# Patient Record
Sex: Male | Born: 1950 | Race: White | Hispanic: No | Smoking: Current every day smoker
Health system: Southern US, Community
[De-identification: ages and names within clinical notes are randomized; demographics above are authoritative.]

## PROBLEM LIST (undated history)

## (undated) DIAGNOSIS — Z720 Tobacco use: Secondary | ICD-10-CM

## (undated) DIAGNOSIS — E119 Type 2 diabetes mellitus without complications: Secondary | ICD-10-CM

## (undated) DIAGNOSIS — J189 Pneumonia, unspecified organism: Secondary | ICD-10-CM

## (undated) DIAGNOSIS — B879 Myiasis, unspecified: Secondary | ICD-10-CM

## (undated) DIAGNOSIS — E11621 Type 2 diabetes mellitus with foot ulcer: Secondary | ICD-10-CM

## (undated) DIAGNOSIS — L03115 Cellulitis of right lower limb: Secondary | ICD-10-CM

## (undated) DIAGNOSIS — J45909 Unspecified asthma, uncomplicated: Secondary | ICD-10-CM

## (undated) DIAGNOSIS — E7800 Pure hypercholesterolemia, unspecified: Secondary | ICD-10-CM

## (undated) DIAGNOSIS — M1990 Unspecified osteoarthritis, unspecified site: Secondary | ICD-10-CM

## (undated) DIAGNOSIS — E669 Obesity, unspecified: Secondary | ICD-10-CM

## (undated) DIAGNOSIS — L97509 Non-pressure chronic ulcer of other part of unspecified foot with unspecified severity: Secondary | ICD-10-CM

## (undated) DIAGNOSIS — D509 Iron deficiency anemia, unspecified: Secondary | ICD-10-CM

## (undated) HISTORY — PX: APPENDECTOMY: SHX54

## (undated) HISTORY — DX: Iron deficiency anemia, unspecified: D50.9

---

## 2012-03-18 ENCOUNTER — Emergency Department (HOSPITAL_COMMUNITY): Payer: BC Managed Care – PPO

## 2012-03-18 ENCOUNTER — Encounter (HOSPITAL_COMMUNITY): Payer: Self-pay

## 2012-03-18 ENCOUNTER — Emergency Department (HOSPITAL_COMMUNITY)
Admission: EM | Admit: 2012-03-18 | Discharge: 2012-03-18 | Disposition: A | Discharge status: Home / Self Care | Payer: BC Managed Care – PPO | Attending: Emergency Medicine | Admitting: Emergency Medicine

## 2012-03-18 DIAGNOSIS — N5089 Other specified disorders of the male genital organs: Secondary | ICD-10-CM | POA: Insufficient documentation

## 2012-03-18 DIAGNOSIS — R32 Unspecified urinary incontinence: Secondary | ICD-10-CM | POA: Insufficient documentation

## 2012-03-18 DIAGNOSIS — F17200 Nicotine dependence, unspecified, uncomplicated: Secondary | ICD-10-CM | POA: Insufficient documentation

## 2012-03-18 DIAGNOSIS — N453 Epididymo-orchitis: Secondary | ICD-10-CM | POA: Insufficient documentation

## 2012-03-18 DIAGNOSIS — R739 Hyperglycemia, unspecified: Secondary | ICD-10-CM

## 2012-03-18 DIAGNOSIS — R6883 Chills (without fever): Secondary | ICD-10-CM | POA: Insufficient documentation

## 2012-03-18 DIAGNOSIS — E1169 Type 2 diabetes mellitus with other specified complication: Secondary | ICD-10-CM | POA: Insufficient documentation

## 2012-03-18 DIAGNOSIS — M7989 Other specified soft tissue disorders: Secondary | ICD-10-CM | POA: Insufficient documentation

## 2012-03-18 DIAGNOSIS — N451 Epididymitis: Secondary | ICD-10-CM

## 2012-03-18 DIAGNOSIS — N509 Disorder of male genital organs, unspecified: Secondary | ICD-10-CM | POA: Insufficient documentation

## 2012-03-18 DIAGNOSIS — N320 Bladder-neck obstruction: Secondary | ICD-10-CM | POA: Insufficient documentation

## 2012-03-18 DIAGNOSIS — Z8739 Personal history of other diseases of the musculoskeletal system and connective tissue: Secondary | ICD-10-CM | POA: Insufficient documentation

## 2012-03-18 HISTORY — DX: Type 2 diabetes mellitus without complications: E11.9

## 2012-03-18 HISTORY — DX: Unspecified osteoarthritis, unspecified site: M19.90

## 2012-03-18 LAB — URINE MICROSCOPIC-ADD ON

## 2012-03-18 LAB — COMPREHENSIVE METABOLIC PANEL
AST: 14 U/L (ref 0–37)
BUN: 31 mg/dL — ABNORMAL HIGH (ref 6–23)
CO2: 26 mEq/L (ref 19–32)
Calcium: 9.1 mg/dL (ref 8.4–10.5)
Creatinine, Ser: 1.6 mg/dL — ABNORMAL HIGH (ref 0.50–1.35)
GFR calc Af Amer: 52 mL/min — ABNORMAL LOW (ref >90)
GFR calc non Af Amer: 45 mL/min — ABNORMAL LOW (ref >90)
Glucose, Bld: 467 mg/dL — ABNORMAL HIGH (ref 70–99)

## 2012-03-18 LAB — CBC WITH DIFFERENTIAL/PLATELET
Basophils Absolute: 0 10*3/uL (ref 0.0–0.1)
Eosinophils Relative: 2 % (ref 0–5)
HCT: 42 % (ref 39.0–52.0)
Lymphocytes Relative: 16 % (ref 12–46)
MCV: 86.8 fL (ref 78.0–100.0)
Monocytes Absolute: 0.8 10*3/uL (ref 0.1–1.0)
RDW: 12.6 % (ref 11.5–15.5)
WBC: 8.7 10*3/uL (ref 4.0–10.5)

## 2012-03-18 LAB — URINALYSIS, ROUTINE W REFLEX MICROSCOPIC
Nitrite: NEGATIVE
Specific Gravity, Urine: <1.005 — ABNORMAL LOW (ref 1.005–1.030)
Urobilinogen, UA: 0.2 mg/dL (ref 0.0–1.0)

## 2012-03-18 LAB — GLUCOSE, CAPILLARY: Glucose-Capillary: 404 mg/dL — ABNORMAL HIGH (ref 70–99)

## 2012-03-18 MED ORDER — INSULIN ASPART 100 UNIT/ML ~~LOC~~ SOLN
15.0000 [IU] | Freq: Once | SUBCUTANEOUS | Status: AC
  Administered 2012-03-18: 15 [IU] via SUBCUTANEOUS
  Filled 2012-03-18: qty 1

## 2012-03-18 MED ORDER — SODIUM CHLORIDE 0.9 % IV BOLUS (SEPSIS)
1000.0000 mL | Freq: Once | INTRAVENOUS | Status: AC
  Administered 2012-03-18: 1000 mL via INTRAVENOUS

## 2012-03-18 MED ORDER — ONDANSETRON HCL 4 MG/2ML IJ SOLN
4.0000 mg | Freq: Once | INTRAMUSCULAR | Status: AC
  Administered 2012-03-18: 4 mg via INTRAVENOUS
  Filled 2012-03-18: qty 2

## 2012-03-18 MED ORDER — IOHEXOL 300 MG/ML  SOLN
50.0000 mL | Freq: Once | INTRAMUSCULAR | Status: AC | PRN
  Administered 2012-03-18: 50 mL via ORAL

## 2012-03-18 MED ORDER — HYDROMORPHONE HCL PF 1 MG/ML IJ SOLN
1.0000 mg | Freq: Once | INTRAMUSCULAR | Status: AC
  Administered 2012-03-18: 1 mg via INTRAVENOUS
  Filled 2012-03-18: qty 1

## 2012-03-18 MED ORDER — CIPROFLOXACIN HCL 500 MG PO TABS: 500.0000 mg | ORAL_TABLET | Freq: Two times a day (BID) | ORAL | Status: DC

## 2012-03-18 MED ORDER — IOHEXOL 300 MG/ML  SOLN
80.0000 mL | Freq: Once | INTRAMUSCULAR | Status: AC | PRN
  Administered 2012-03-18: 80 mL via INTRAVENOUS

## 2012-03-18 NOTE — ED Notes (Signed)
Patient states that he has noticed that his blood sugar has been running high for the last several days, states that it has been over 500.  States that he has also been having pain in swelling in his scrotum over the last several days.

## 2012-03-18 NOTE — ED Notes (Signed)
1000 mls drained from bladder by foley catheter and still draining at a fast rate. Foley clamped at this time to slow bladder drainage down. Will unclamp foley in about 15 minutes to allow further drainage.

## 2012-03-18 NOTE — ED Notes (Signed)
Complains of severe back pain after drinking oral contrast, MD notified.

## 2012-03-18 NOTE — ED Provider Notes (Signed)
History  This chart was scribed for Benny Lennert, MD by Bennett Scrape, ED Scribe. This patient was seen in room APA12/APA12 and the patient's care was started at 7:30 AM.  CSN: [id number]  Arrival date & time 03/18/12  0718   First MD Initiated Contact with Patient 03/18/12 0730      Chief Complaint  Patient presents with  . Hyperglycemia     The history is provided by the patient. No language interpreter was used.   Mitchell [name] is a 62 y.o. male with a h/o DM who presents to the Emergency Department complaining of 3 days of gradual onset, gradually worsening, constant hyperglycemia with associated mild urinary incontinence described as uncontrollable dribbling and leg swelling. CBG was 523 this morning. He states that at baseline his blood glucose is in the 200s. He currently takes 1,000 mg metformin twice daily, injects 12-20 units of Lantus once daily and injects 0.6-1.8 mg liraglutide daily. He denies any recent changes in his medications. He also c/o 2 days of scrotal pain described as soreness that radiates to his lower back and suprapubic region with associated swelling, mild itching and chills that started after the urinary incontinence. He reports that he has been wearing Depends due to the urinary incontinence which is different from his baseline. He states that he was seen by his PCP at [facility] for the same and reports that "nothing was done". He denies weakness or numbness, CP and SOB as associated symptoms. He denies having a h/o CHF. He also has a h/o arthritis and is a current everyday smoker but denies alcohol use.  Dr. Clelia Croft is endocrinologist; however, pt states that he is trying to find a new endocrinologist   Past Medical History  Diagnosis Date  . Diabetes mellitus without complication   . Arthritis     Past Surgical History  Procedure Laterality Date  . Appendectomy      No family history on file.  History  Substance Use Topics  . Smoking status: Current  Every Day Smoker    Types: Cigarettes  . Smokeless tobacco: Not on file  . Alcohol Use: No      Review of Systems  Constitutional: Positive for chills. Negative for fatigue.  HENT: Negative for congestion, sinus pressure and ear discharge.   Eyes: Negative for discharge.  Respiratory: Negative for cough.   Cardiovascular: Positive for leg swelling. Negative for chest pain.  Gastrointestinal: Negative for abdominal pain and diarrhea.  Genitourinary: Positive for scrotal swelling. Negative for frequency and hematuria.       Urinary incontinence   Musculoskeletal: Negative for back pain.  Skin: Negative for rash.  Neurological: Negative for seizures and headaches.  Psychiatric/Behavioral: Negative for hallucinations.  All other systems reviewed and are negative.    Allergies  Review of patient's allergies indicates not on file.  Home Medications  No current outpatient prescriptions on file.  Triage Vitals: BP 150/87  Pulse 95  Temp(Src) 97.5 F (36.4 C) (Oral)  Resp 20  Ht 5' 11.5" (1.816 m)  Wt 265 lb (120.203 kg)  BMI 36.45 kg/m2  SpO2 99%  Physical Exam  Nursing note and vitals reviewed. Constitutional: He is oriented to person, place, and time. He appears well-developed and well-nourished. No distress.  HENT:  Head: Normocephalic and atraumatic.  Eyes: Conjunctivae and EOM are normal. No scleral icterus.  Neck: Neck supple. No thyromegaly present.  Cardiovascular: Normal rate and regular rhythm.  Exam reveals no gallop and no friction rub.  No murmur heard. Pulmonary/Chest: Effort normal and breath sounds normal. No stridor. He has no wheezes. He has no rales. He exhibits no tenderness.  Abdominal: He exhibits distension. There is tenderness (suprapubic tenderness). There is no rebound.  Abdomen is firm up to umbilicus   Genitourinary:  Scrotum is erythematous and swollen, mildly tender  Musculoskeletal: Normal range of motion. He exhibits edema (2+ edema in  ankles bilaterally ).  Lymphadenopathy:    He has no cervical adenopathy.  Neurological: He is alert and oriented to person, place, and time. Coordination normal.  Skin: Skin is warm and dry. No rash noted. No erythema.  Psychiatric: He has a normal mood and affect. His behavior is normal.    ED Course  Procedures (including critical care time)  DIAGNOSTIC STUDIES: Oxygen Saturation is 99% on room air, normal by my interpretation.    COORDINATION OF CARE: 7:40 AM-Discussed treatment plan which includes IV fluids, CBC panel, UA and insulin with pt at bedside and pt agreed to plan.   7:45 AM- Ordered 1,000 mL of bolus and 15 units of insulin injection  9:45 AM-  Ordered 4 mg Zofran injection and 1 mg Dilaudid injection.   11:23 AM- Informed pt that radiology reports showed a bladder obstruction and a scrotal infection. Discussed that a foley catheter will have to be inserted to drain the bladder.  12:15 PM-Per nurse, pt had 2700 cc of urine in his foley catheter. Discussed discharge plan which includes leg bag for foley and cream for scrotum with pt and pt agreed to plan. Also advised pt to follow up with Dr. Clelia Croft, endocrinologist, and urology referral and pt agreed.  Labs Reviewed  GLUCOSE, CAPILLARY - Abnormal; Notable for the following:    Glucose-Capillary 489 (*)    All other components within normal limits  COMPREHENSIVE METABOLIC PANEL - Abnormal; Notable for the following:    Sodium 130 (*)    Chloride 91 (*)    Glucose, Bld 467 (*)    BUN 31 (*)    Creatinine, Ser 1.60 (*)    Alkaline Phosphatase 143 (*)    GFR calc non Af Amer 45 (*)    GFR calc Af Amer 52 (*)    All other components within normal limits  URINALYSIS, ROUTINE W REFLEX MICROSCOPIC - Abnormal; Notable for the following:    Specific Gravity, Urine <1.005 (*)    Glucose, UA 500 (*)    Hgb urine dipstick SMALL (*)    All other components within normal limits  GLUCOSE, CAPILLARY - Abnormal; Notable for  the following:    Glucose-Capillary 404 (*)    All other components within normal limits  CBC WITH DIFFERENTIAL  URINE MICROSCOPIC-ADD ON   US Scrotum  03/18/2012  *RADIOLOGY REPORT*  Clinical Data:  Scrotal swelling  SCROTAL ULTRASOUND DOPPLER ULTRASOUND OF THE TESTICLES  Technique: Complete ultrasound examination of the testicles, epididymis, and other scrotal structures was performed.  Color and spectral Doppler ultrasound were also utilized to evaluate blood flow to the testicles.  Comparison:  None.  Findings:  Right testis:  Normal in size and appearance, measuring 3.9 x 2.6 x 2.1 cm.  Left testis:  Normal in size and appearance, measuring 3.4 x 2.7 x 2.7 cm.  Right epididymis:  Heterogeneous appearance with increased vascularity.  Left epididymis:  Normal in size and appearance.  Hydrocele:  Absent.  Varicocele:  Present on the left.  Pulsed Doppler interrogation of both testes demonstrates low resistance flow bilaterally.  Additional  comments:  Extensive scrotal wall edema without drainable fluid collection/abscess.  IMPRESSION: Findings suspicious for right epididymitis.  No evidence of testicular torsion.  Extensive scrotal wall edema without drainable fluid collection/abscess.  Left varicocele.   Original Report Authenticated By: Charline Bills, M.D.    [place] Art/ven Flow Abd Pelv Doppler  03/18/2012  *RADIOLOGY REPORT*  Clinical Data:  Scrotal swelling  SCROTAL ULTRASOUND DOPPLER ULTRASOUND OF THE TESTICLES  Technique: Complete ultrasound examination of the testicles, epididymis, and other scrotal structures was performed.  Color and spectral Doppler ultrasound were also utilized to evaluate blood flow to the testicles.  Comparison:  None.  Findings:  Right testis:  Normal in size and appearance, measuring 3.9 x 2.6 x 2.1 cm.  Left testis:  Normal in size and appearance, measuring 3.4 x 2.7 x 2.7 cm.  Right epididymis:  Heterogeneous appearance with increased vascularity.  Left epididymis:  Normal  in size and appearance.  Hydrocele:  Absent.  Varicocele:  Present on the left.  Pulsed Doppler interrogation of both testes demonstrates low resistance flow bilaterally.  Additional comments:  Extensive scrotal wall edema without drainable fluid collection/abscess.  IMPRESSION: Findings suspicious for right epididymitis.  No evidence of testicular torsion.  Extensive scrotal wall edema without drainable fluid collection/abscess.  Left varicocele.   Original Report Authenticated By: Charline Bills, M.D.      No diagnosis found.    MDM       The chart was scribed for me under my direct supervision.  I personally performed the history, physical, and medical decision making and all procedures in the evaluation of this patient.Benny Lennert, MD 03/18/12 [phone]

## 2012-03-18 NOTE — ED Notes (Signed)
Pt reports high blood sugar for 2 days, also has swollen scrotum for 2 days, went to pmd and he made no changes. Now looking for new pmd

## 2012-03-19 ENCOUNTER — Emergency Department (HOSPITAL_COMMUNITY)
Admission: EM | Admit: 2012-03-19 | Discharge: 2012-03-19 | Disposition: A | Discharge status: Home / Self Care | Payer: BC Managed Care – PPO | Attending: Emergency Medicine | Admitting: Emergency Medicine

## 2012-03-19 ENCOUNTER — Encounter (HOSPITAL_COMMUNITY): Payer: Self-pay

## 2012-03-19 DIAGNOSIS — Z79899 Other long term (current) drug therapy: Secondary | ICD-10-CM | POA: Insufficient documentation

## 2012-03-19 DIAGNOSIS — R300 Dysuria: Secondary | ICD-10-CM | POA: Insufficient documentation

## 2012-03-19 DIAGNOSIS — E119 Type 2 diabetes mellitus without complications: Secondary | ICD-10-CM | POA: Insufficient documentation

## 2012-03-19 DIAGNOSIS — F17200 Nicotine dependence, unspecified, uncomplicated: Secondary | ICD-10-CM | POA: Insufficient documentation

## 2012-03-19 DIAGNOSIS — T83091A Other mechanical complication of indwelling urethral catheter, initial encounter: Secondary | ICD-10-CM | POA: Insufficient documentation

## 2012-03-19 DIAGNOSIS — Y846 Urinary catheterization as the cause of abnormal reaction of the patient, or of later complication, without mention of misadventure at the time of the procedure: Secondary | ICD-10-CM | POA: Insufficient documentation

## 2012-03-19 DIAGNOSIS — R3989 Other symptoms and signs involving the genitourinary system: Secondary | ICD-10-CM | POA: Insufficient documentation

## 2012-03-19 DIAGNOSIS — T839XXD Unspecified complication of genitourinary prosthetic device, implant and graft, subsequent encounter: Secondary | ICD-10-CM

## 2012-03-19 DIAGNOSIS — Z794 Long term (current) use of insulin: Secondary | ICD-10-CM | POA: Insufficient documentation

## 2012-03-19 DIAGNOSIS — R339 Retention of urine, unspecified: Secondary | ICD-10-CM | POA: Insufficient documentation

## 2012-03-19 DIAGNOSIS — Z8739 Personal history of other diseases of the musculoskeletal system and connective tissue: Secondary | ICD-10-CM | POA: Insufficient documentation

## 2012-03-19 DIAGNOSIS — Z7982 Long term (current) use of aspirin: Secondary | ICD-10-CM | POA: Insufficient documentation

## 2012-03-19 NOTE — ED Provider Notes (Signed)
History/physical exam/procedure(s) were performed by non-physician practitioner and as supervising physician I was immediately available for consultation/collaboration. I have reviewed all notes and am in agreement with care and plan.   Hilario Quarry, MD 03/19/12 [phone]

## 2012-03-19 NOTE — ED Provider Notes (Signed)
History     CSN: [id number]  Arrival date & time 03/19/12  0826   First MD Initiated Contact with Patient 03/19/12 [phone]      Chief Complaint  Patient presents with  . Urinary Retention    (Consider location/radiation/quality/duration/timing/severity/associated sxs/prior treatment) HPI Comments: Mitchell [name] is a 62 y.o. Male who returns today for urinary retention problems.  He was seen here yesterday and treated for epididymitis and bladder outlet obstruction with a foley catheter and plans to follow up with a urologist next week.  He woke this morning with bladder pain and lower abdominal distention along with very little urine in his foley bag.  He denies nausea, vomiting and fever.  He is scheduled to see Dr. Nechama Guard, urologist in [place] in 6 days.     The history is provided by the patient.    Past Medical History  Diagnosis Date  . Diabetes mellitus without complication   . Arthritis     Past Surgical History  Procedure Laterality Date  . Appendectomy      No family history on file.  History  Substance Use Topics  . Smoking status: Current Every Day Smoker    Types: Cigarettes  . Smokeless tobacco: Not on file  . Alcohol Use: No      Review of Systems  Constitutional: Negative for fever.  HENT: Negative for congestion, sore throat and neck pain.   Eyes: Negative.   Respiratory: Negative for chest tightness and shortness of breath.   Cardiovascular: Negative for chest pain.  Gastrointestinal: Negative for nausea and abdominal pain.  Genitourinary: Positive for difficulty urinating.  Musculoskeletal: Negative for joint swelling and arthralgias.  Skin: Negative.  Negative for rash and wound.  Neurological: Negative for dizziness, weakness, light-headedness, numbness and headaches.  Psychiatric/Behavioral: Negative.     Allergies  Penicillins  Home Medications   Current Outpatient Rx  Name  Route  Sig  Dispense  Refill  . aspirin 325 MG tablet   Oral    Take 325 mg by mouth daily.         . ciprofloxacin (CIPRO) 500 MG tablet   Oral   Take 1 tablet (500 mg total) by mouth 2 (two) times daily.   20 tablet   0   . furosemide (LASIX) 20 MG tablet   Oral   Take 20 mg by mouth daily.         Marland Kitchen gabapentin (NEURONTIN) 300 MG capsule   Oral   Take 300 mg by mouth 3 (three) times daily.         . insulin glargine (LANTUS) 100 UNIT/ML injection   Subcutaneous   Inject 12-20 Units into the skin at bedtime.         . Liraglutide (VICTOZA) 18 MG/3ML SOLN injection   Subcutaneous   Inject 0.6-1.8 mg into the skin daily.         Marland Kitchen lovastatin (MEVACOR) 20 MG tablet   Oral   Take 20 mg by mouth at bedtime.         . metFORMIN (GLUCOPHAGE) 500 MG tablet   Oral   Take 1,000 mg by mouth 2 (two) times daily with a meal.           BP 147/96  Pulse 102  Temp(Src) 98.4 F (36.9 C) (Oral)  Resp 20  Ht 5\' 11"  (1.803 m)  Wt 265 lb (120.203 kg)  BMI 36.98 kg/m2  SpO2 98%  Physical Exam  Nursing note and vitals reviewed.  Constitutional: He appears well-developed and well-nourished.  HENT:  Head: Normocephalic and atraumatic.  Eyes: Conjunctivae are normal.  Neck: Normal range of motion.  Cardiovascular: Normal rate, regular rhythm, normal heart sounds and intact distal pulses.   Pulmonary/Chest: Effort normal and breath sounds normal. He has no wheezes.  Abdominal: Soft. Bowel sounds are normal. He exhibits distension. There is tenderness in the suprapubic area.  Musculoskeletal: Normal range of motion.  Neurological: He is alert.  Skin: Skin is warm and dry.  Psychiatric: He has a normal mood and affect.    ED Course  Procedures (including critical care time)  Labs Reviewed - No data to display US Scrotum  03/18/2012  *RADIOLOGY REPORT*  Clinical Data:  Scrotal swelling  SCROTAL ULTRASOUND DOPPLER ULTRASOUND OF THE TESTICLES  Technique: Complete ultrasound examination of the testicles, epididymis, and other scrotal  structures was performed.  Color and spectral Doppler ultrasound were also utilized to evaluate blood flow to the testicles.  Comparison:  None.  Findings:  Right testis:  Normal in size and appearance, measuring 3.9 x 2.6 x 2.1 cm.  Left testis:  Normal in size and appearance, measuring 3.4 x 2.7 x 2.7 cm.  Right epididymis:  Heterogeneous appearance with increased vascularity.  Left epididymis:  Normal in size and appearance.  Hydrocele:  Absent.  Varicocele:  Present on the left.  Pulsed Doppler interrogation of both testes demonstrates low resistance flow bilaterally.  Additional comments:  Extensive scrotal wall edema without drainable fluid collection/abscess.  IMPRESSION: Findings suspicious for right epididymitis.  No evidence of testicular torsion.  Extensive scrotal wall edema without drainable fluid collection/abscess.  Left varicocele.   Original Report Authenticated By: Charline Bills, M.D.    Ct Abdomen Pelvis W Contrast  03/18/2012  *RADIOLOGY REPORT*  Clinical Data:  urinary retention, scrotal pain  CT ABDOMEN AND PELVIS WITH CONTRAST  Technique:  Multidetector CT imaging of the abdomen and pelvis was performed following the standard protocol during bolus administration of intravenous contrast.  Contrast: 50mL OMNIPAQUE IOHEXOL 300 MG/ML  SOLN, 80mL OMNIPAQUE IOHEXOL 300 MG/ML  SOLN  Comparison: None.  Findings: Lung bases are unremarkable.  Degenerative changes right lower lumbar spine. Mild hepatic fatty infiltration.  There is a probable cyst in the left hepatic dome measures 9 mm.  No calcified gallstones are noted within gallbladder.  The pancreas, spleen and adrenal glands are unremarkable.  Kidneys are symmetrical in size and enhancement. Mild bilateral hydronephrosis and hydroureter.  No nephrolithiasis.  No calcified ureteral calculi.  No definite ureteral stricture is identified.  Significant distended urinary bladder is noted.  On sagittal image 56 the urinary bladder measures 22 cm  cranial caudally by 12 cm AP diameter.  Coarse calcifications are noted at the base of prostate gland.  Mild enlarged prostate gland with indentation of urinary bladder base measures 4.4 x 3.4 cm.  No urinary bladder calcified calculi are noted.  Delayed renal images shows bilateral renal delayed excretion probable due to obstructive uropathy.  Bilateral enlarged inguinal lymph nodes are noted.  Although this may be reactive in nature, lymphoproliferative or metastatic disease cannot be excluded.  Clinical correlation is necessary. Largest right inguinal lymph node measures 2 x 1.7 cm.  The largest left inguinal lymph node measures 1.9 x 1.7 cm.  No pelvic free air or free fluid.  Significant stool noted in the right colon and cecum.  No pericecal inflammation.  The terminal ileum is unremarkable.  The appendix is surgically absent.  The left colon  is empty partially collapsed. No destructive bony lesions are noted within pelvis.  Mild degenerative changes bilateral SI joints.  IMPRESSION:  1.  There is mild hydronephrosis and mild hydroureter bilaterally. 2.  Delayed excretion bilateral kidney probable due to obstructive uropathy.  3.  Significant urinary bladder distention without calcified calculi within bladder.  Findings are highly suspicious for bladder outlet obstruction. Correlation with urology exam is recommended. 4. Mild enlarged prostate gland with indentation of urinary bladder base. 5.  Bilateral inguinal lymph nodes.  Although may be reactive in nature lymphoproliferative or metastatic disease cannot be excluded.  Clinical correlation is necessary. 6.  Significant stool in the right colon and cecum.  No pericecal inflammation.  Critical findings discussed with Dr. Estell Harpin.   Original Report Authenticated By: Natasha Mead, M.D.    [place] Art/ven Flow Abd Pelv Doppler  03/18/2012  *RADIOLOGY REPORT*  Clinical Data:  Scrotal swelling  SCROTAL ULTRASOUND DOPPLER ULTRASOUND OF THE TESTICLES  Technique: Complete  ultrasound examination of the testicles, epididymis, and other scrotal structures was performed.  Color and spectral Doppler ultrasound were also utilized to evaluate blood flow to the testicles.  Comparison:  None.  Findings:  Right testis:  Normal in size and appearance, measuring 3.9 x 2.6 x 2.1 cm.  Left testis:  Normal in size and appearance, measuring 3.4 x 2.7 x 2.7 cm.  Right epididymis:  Heterogeneous appearance with increased vascularity.  Left epididymis:  Normal in size and appearance.  Hydrocele:  Absent.  Varicocele:  Present on the left.  Pulsed Doppler interrogation of both testes demonstrates low resistance flow bilaterally.  Additional comments:  Extensive scrotal wall edema without drainable fluid collection/abscess.  IMPRESSION: Findings suspicious for right epididymitis.  No evidence of testicular torsion.  Extensive scrotal wall edema without drainable fluid collection/abscess.  Left varicocele.   Original Report Authenticated By: Charline Bills, M.D.      1. Foley catheter problem, subsequent encounter     Foley was flushed with several small clots removed.  Foley started draining after this with 1800 cc total light, clear urine.  Bag clamped.  Pt given PO fluids,  Then foley allowed to continue draining.  MDM  Simple blockage of foley catheter with resolution after flushing the tubing.  Plan to f/u with urology next week as planned.        Burgess Amor, PA-C 03/19/12 [phone]

## 2012-03-19 NOTE — ED Notes (Signed)
Pt reports was seen here yesterday and had foley cath placed.  Pt reports emptied foley bag before bed and woke up this am with only small amount of urine in foley.  Pt c/o pressure in abd.

## 2013-03-21 ENCOUNTER — Inpatient Hospital Stay (HOSPITAL_COMMUNITY)
Admission: EM | Admit: 2013-03-21 | Discharge: 2013-03-22 | DRG: 637 | Disposition: A | Discharge status: Home / Self Care | Payer: Medicare Other | Attending: Family Medicine | Admitting: Family Medicine

## 2013-03-21 ENCOUNTER — Encounter (HOSPITAL_COMMUNITY): Payer: Self-pay | Admitting: Emergency Medicine

## 2013-03-21 ENCOUNTER — Emergency Department (HOSPITAL_COMMUNITY): Payer: Medicare Other

## 2013-03-21 DIAGNOSIS — E860 Dehydration: Secondary | ICD-10-CM | POA: Diagnosis present

## 2013-03-21 DIAGNOSIS — E1165 Type 2 diabetes mellitus with hyperglycemia: Principal | ICD-10-CM | POA: Diagnosis present

## 2013-03-21 DIAGNOSIS — F17200 Nicotine dependence, unspecified, uncomplicated: Secondary | ICD-10-CM | POA: Diagnosis present

## 2013-03-21 DIAGNOSIS — E11621 Type 2 diabetes mellitus with foot ulcer: Secondary | ICD-10-CM | POA: Diagnosis present

## 2013-03-21 DIAGNOSIS — E119 Type 2 diabetes mellitus without complications: Secondary | ICD-10-CM

## 2013-03-21 DIAGNOSIS — Z91148 Patient's other noncompliance with medication regimen for other reason: Secondary | ICD-10-CM

## 2013-03-21 DIAGNOSIS — Z794 Long term (current) use of insulin: Secondary | ICD-10-CM

## 2013-03-21 DIAGNOSIS — L03119 Cellulitis of unspecified part of limb: Secondary | ICD-10-CM

## 2013-03-21 DIAGNOSIS — L03115 Cellulitis of right lower limb: Secondary | ICD-10-CM

## 2013-03-21 DIAGNOSIS — R739 Hyperglycemia, unspecified: Secondary | ICD-10-CM

## 2013-03-21 DIAGNOSIS — L02419 Cutaneous abscess of limb, unspecified: Secondary | ICD-10-CM

## 2013-03-21 DIAGNOSIS — R7309 Other abnormal glucose: Secondary | ICD-10-CM

## 2013-03-21 DIAGNOSIS — L02619 Cutaneous abscess of unspecified foot: Secondary | ICD-10-CM | POA: Diagnosis present

## 2013-03-21 DIAGNOSIS — M898X9 Other specified disorders of bone, unspecified site: Secondary | ICD-10-CM | POA: Diagnosis present

## 2013-03-21 DIAGNOSIS — M129 Arthropathy, unspecified: Secondary | ICD-10-CM | POA: Diagnosis present

## 2013-03-21 DIAGNOSIS — IMO0002 Reserved for concepts with insufficient information to code with codable children: Principal | ICD-10-CM | POA: Diagnosis present

## 2013-03-21 DIAGNOSIS — L97509 Non-pressure chronic ulcer of other part of unspecified foot with unspecified severity: Secondary | ICD-10-CM | POA: Diagnosis present

## 2013-03-21 DIAGNOSIS — Z9114 Patient's other noncompliance with medication regimen: Secondary | ICD-10-CM

## 2013-03-21 DIAGNOSIS — E1169 Type 2 diabetes mellitus with other specified complication: Principal | ICD-10-CM

## 2013-03-21 DIAGNOSIS — Z91199 Patient's noncompliance with other medical treatment and regimen due to unspecified reason: Secondary | ICD-10-CM

## 2013-03-21 DIAGNOSIS — G9340 Encephalopathy, unspecified: Secondary | ICD-10-CM | POA: Diagnosis present

## 2013-03-21 DIAGNOSIS — D696 Thrombocytopenia, unspecified: Secondary | ICD-10-CM | POA: Diagnosis present

## 2013-03-21 DIAGNOSIS — Z9119 Patient's noncompliance with other medical treatment and regimen: Secondary | ICD-10-CM

## 2013-03-21 DIAGNOSIS — Z7982 Long term (current) use of aspirin: Secondary | ICD-10-CM

## 2013-03-21 LAB — URINALYSIS, ROUTINE W REFLEX MICROSCOPIC
Bilirubin Urine: NEGATIVE
Glucose, UA: >1000 mg/dL — AB
KETONES UR: NEGATIVE mg/dL
Leukocytes, UA: NEGATIVE
Nitrite: NEGATIVE
Protein, ur: NEGATIVE mg/dL
Specific Gravity, Urine: <1.005 — ABNORMAL LOW (ref 1.005–1.030)
UROBILINOGEN UA: 0.2 mg/dL (ref 0.0–1.0)
pH: 6 (ref 5.0–8.0)

## 2013-03-21 LAB — CBC WITH DIFFERENTIAL/PLATELET
BASOS ABS: 0 10*3/uL (ref 0.0–0.1)
Basophils Relative: 0 % (ref 0–1)
EOS PCT: 0 % (ref 0–5)
Eosinophils Absolute: 0 10*3/uL (ref 0.0–0.7)
HCT: 42.5 % (ref 39.0–52.0)
Hemoglobin: 14.8 g/dL (ref 13.0–17.0)
Lymphocytes Relative: 9 % — ABNORMAL LOW (ref 12–46)
Lymphs Abs: 1.5 10*3/uL (ref 0.7–4.0)
MCH: 30.4 pg (ref 26.0–34.0)
MCHC: 34.8 g/dL (ref 30.0–36.0)
MCV: 87.3 fL (ref 78.0–100.0)
MONO ABS: 1 10*3/uL (ref 0.1–1.0)
MONOS PCT: 6 % (ref 3–12)
Neutro Abs: 14.6 10*3/uL — ABNORMAL HIGH (ref 1.7–7.7)
Neutrophils Relative %: 85 % — ABNORMAL HIGH (ref 43–77)
Platelets: 139 10*3/uL — ABNORMAL LOW (ref 150–400)
RBC: 4.87 MIL/uL (ref 4.22–5.81)
RDW: 12.4 % (ref 11.5–15.5)
WBC: 17.2 10*3/uL — ABNORMAL HIGH (ref 4.0–10.5)

## 2013-03-21 LAB — BASIC METABOLIC PANEL
BUN: 13 mg/dL (ref 6–23)
BUN: 11 mg/dL (ref 6–23)
CO2: 26 mEq/L (ref 19–32)
CO2: 26 meq/L (ref 19–32)
CREATININE: 0.91 mg/dL (ref 0.50–1.35)
Calcium: 8.9 mg/dL (ref 8.4–10.5)
Calcium: 8.4 mg/dL (ref 8.4–10.5)
Chloride: 89 mEq/L — ABNORMAL LOW (ref 96–112)
Chloride: 95 mEq/L — ABNORMAL LOW (ref 96–112)
Creatinine, Ser: 0.88 mg/dL (ref 0.50–1.35)
GFR calc Af Amer: >90 mL/min (ref >90)
GFR calc Af Amer: >90 mL/min (ref >90)
GFR calc non Af Amer: 88 mL/min — ABNORMAL LOW (ref >90)
GFR calc non Af Amer: 90 mL/min — ABNORMAL LOW (ref >90)
GLUCOSE: 449 mg/dL — AB (ref 70–99)
Glucose, Bld: 290 mg/dL — ABNORMAL HIGH (ref 70–99)
POTASSIUM: 3.4 meq/L — AB (ref 3.7–5.3)
Potassium: 4 mEq/L (ref 3.7–5.3)
Sodium: 129 mEq/L — ABNORMAL LOW (ref 137–147)
Sodium: 133 mEq/L — ABNORMAL LOW (ref 137–147)

## 2013-03-21 LAB — CBC
HEMATOCRIT: 41.2 % (ref 39.0–52.0)
Hemoglobin: 14.1 g/dL (ref 13.0–17.0)
MCH: 30.1 pg (ref 26.0–34.0)
MCHC: 34.2 g/dL (ref 30.0–36.0)
MCV: 88 fL (ref 78.0–100.0)
Platelets: 129 10*3/uL — ABNORMAL LOW (ref 150–400)
RBC: 4.68 MIL/uL (ref 4.22–5.81)
RDW: 12.5 % (ref 11.5–15.5)
WBC: 13.9 10*3/uL — ABNORMAL HIGH (ref 4.0–10.5)

## 2013-03-21 LAB — URINE MICROSCOPIC-ADD ON

## 2013-03-21 LAB — HEMOGLOBIN A1C
Hgb A1c MFr Bld: 15.4 % — ABNORMAL HIGH (ref <5.7)
Mean Plasma Glucose: 395 mg/dL — ABNORMAL HIGH (ref <117)

## 2013-03-21 LAB — GLUCOSE, CAPILLARY
GLUCOSE-CAPILLARY: 279 mg/dL — AB (ref 70–99)
GLUCOSE-CAPILLARY: 329 mg/dL — AB (ref 70–99)
Glucose-Capillary: 333 mg/dL — ABNORMAL HIGH (ref 70–99)
Glucose-Capillary: 314 mg/dL — ABNORMAL HIGH (ref 70–99)

## 2013-03-21 LAB — CBG MONITORING, ED
GLUCOSE-CAPILLARY: 326 mg/dL — AB (ref 70–99)
GLUCOSE-CAPILLARY: 534 mg/dL — AB (ref 70–99)
Glucose-Capillary: 494 mg/dL — ABNORMAL HIGH (ref 70–99)

## 2013-03-21 MED ORDER — SODIUM CHLORIDE 0.9 % IV SOLN
INTRAVENOUS | Status: DC
  Filled 2013-03-21: qty 1

## 2013-03-21 MED ORDER — VANCOMYCIN HCL IN DEXTROSE 1-5 GM/200ML-% IV SOLN
INTRAVENOUS | Status: AC
  Filled 2013-03-21: qty 200

## 2013-03-21 MED ORDER — INSULIN GLARGINE 100 UNIT/ML ~~LOC~~ SOLN: 15.0000 [IU] | Freq: Every day | SUBCUTANEOUS | Status: DC

## 2013-03-21 MED ORDER — INSULIN ASPART 100 UNIT/ML ~~LOC~~ SOLN
10.0000 [IU] | Freq: Once | SUBCUTANEOUS | Status: AC
Start: 2013-03-21 — End: 2013-03-21
  Administered 2013-03-21: 05:00:00 via SUBCUTANEOUS

## 2013-03-21 MED ORDER — SODIUM CHLORIDE 0.9 % IV SOLN
1000.0000 mL | INTRAVENOUS | Status: DC
  Administered 2013-03-21: 1000 mL via INTRAVENOUS

## 2013-03-21 MED ORDER — INSULIN GLARGINE 100 UNIT/ML ~~LOC~~ SOLN
10.0000 [IU] | Freq: Every day | SUBCUTANEOUS | Status: DC
  Administered 2013-03-21: 10 [IU] via SUBCUTANEOUS
  Filled 2013-03-21 (×2): qty 0.1

## 2013-03-21 MED ORDER — DOXYCYCLINE HYCLATE 100 MG PO TABS
100.0000 mg | ORAL_TABLET | Freq: Two times a day (BID) | ORAL | Status: DC
  Administered 2013-03-21 – 2013-03-22 (×3): 100 mg via ORAL
  Filled 2013-03-21 (×3): qty 1

## 2013-03-21 MED ORDER — DEXTROSE-NACL 5-0.45 % IV SOLN: INTRAVENOUS | Status: DC

## 2013-03-21 MED ORDER — SODIUM CHLORIDE 0.9 % IV SOLN
INTRAVENOUS | Status: DC
  Administered 2013-03-21: 06:00:00 via INTRAVENOUS

## 2013-03-21 MED ORDER — ONDANSETRON HCL 4 MG/2ML IJ SOLN
4.0000 mg | Freq: Three times a day (TID) | INTRAMUSCULAR | Status: AC | PRN
Start: 2013-03-21 — End: 2013-03-21

## 2013-03-21 MED ORDER — VANCOMYCIN HCL IN DEXTROSE 1-5 GM/200ML-% IV SOLN
1000.0000 mg | Freq: Two times a day (BID) | INTRAVENOUS | Status: DC
  Administered 2013-03-21: 1000 mg via INTRAVENOUS
  Filled 2013-03-21 (×5): qty 200

## 2013-03-21 MED ORDER — INSULIN ASPART 100 UNIT/ML ~~LOC~~ SOLN
0.0000 [IU] | Freq: Every day | SUBCUTANEOUS | Status: DC
  Administered 2013-03-21: 4 [IU] via SUBCUTANEOUS

## 2013-03-21 MED ORDER — SODIUM CHLORIDE 0.9 % IV BOLUS (SEPSIS)
1000.0000 mL | Freq: Once | INTRAVENOUS | Status: AC
  Administered 2013-03-21: 1000 mL via INTRAVENOUS

## 2013-03-21 MED ORDER — ACETAMINOPHEN 325 MG PO TABS: 650.0000 mg | ORAL_TABLET | Freq: Four times a day (QID) | ORAL | Status: DC | PRN

## 2013-03-21 MED ORDER — GABAPENTIN 300 MG PO CAPS
300.0000 mg | ORAL_CAPSULE | Freq: Three times a day (TID) | ORAL | Status: DC
  Administered 2013-03-21 – 2013-03-22 (×5): 300 mg via ORAL
  Filled 2013-03-21 (×5): qty 1

## 2013-03-21 MED ORDER — SODIUM CHLORIDE 0.9 % IV SOLN: INTRAVENOUS | Status: AC

## 2013-03-21 MED ORDER — INSULIN ASPART 100 UNIT/ML ~~LOC~~ SOLN: 0.0000 [IU] | Freq: Three times a day (TID) | SUBCUTANEOUS | Status: DC

## 2013-03-21 MED ORDER — ASPIRIN 325 MG PO TABS
325.0000 mg | ORAL_TABLET | Freq: Every day | ORAL | Status: DC
  Administered 2013-03-21 – 2013-03-22 (×2): 325 mg via ORAL
  Filled 2013-03-21 (×2): qty 1

## 2013-03-21 MED ORDER — VANCOMYCIN HCL IN DEXTROSE 1-5 GM/200ML-% IV SOLN
1000.0000 mg | Freq: Once | INTRAVENOUS | Status: AC
  Administered 2013-03-21: 1000 mg via INTRAVENOUS
  Filled 2013-03-21: qty 200

## 2013-03-21 MED ORDER — ACETAMINOPHEN 650 MG RE SUPP: 650.0000 mg | Freq: Four times a day (QID) | RECTAL | Status: DC | PRN

## 2013-03-21 MED ORDER — INSULIN ASPART 100 UNIT/ML ~~LOC~~ SOLN
0.0000 [IU] | Freq: Three times a day (TID) | SUBCUTANEOUS | Status: DC
  Administered 2013-03-21: 7 [IU] via SUBCUTANEOUS
  Administered 2013-03-21: 5 [IU] via SUBCUTANEOUS

## 2013-03-21 MED ORDER — INSULIN ASPART 100 UNIT/ML ~~LOC~~ SOLN: 3.0000 [IU] | Freq: Three times a day (TID) | SUBCUTANEOUS | Status: DC

## 2013-03-21 MED ORDER — SODIUM CHLORIDE 0.9 % IV SOLN
1000.0000 mL | Freq: Once | INTRAVENOUS | Status: AC
  Administered 2013-03-21: 1000 mL via INTRAVENOUS

## 2013-03-21 MED ORDER — POTASSIUM CHLORIDE CRYS ER 20 MEQ PO TBCR
40.0000 meq | EXTENDED_RELEASE_TABLET | Freq: Once | ORAL | Status: AC
  Administered 2013-03-21: 40 meq via ORAL
  Filled 2013-03-21: qty 2

## 2013-03-21 MED ORDER — FUROSEMIDE 20 MG PO TABS
20.0000 mg | ORAL_TABLET | Freq: Every day | ORAL | Status: DC
  Administered 2013-03-21 – 2013-03-22 (×2): 20 mg via ORAL
  Filled 2013-03-21 (×2): qty 1

## 2013-03-21 NOTE — Progress Notes (Signed)
UR completed 

## 2013-03-21 NOTE — ED Notes (Signed)
Patient states blood sugar at home was 400 and has sore on right foot; states was seen by MD yesterday and started on antibiotic.

## 2013-03-21 NOTE — Progress Notes (Addendum)
ANTIBIOTIC CONSULT NOTE - INITIAL  Pharmacy Consult for Vancomycin Indication: cellulitis  Allergies  Allergen Reactions  . Penicillins Itching and Rash    Patient Measurements: Height: 5' 11.5" (181.6 cm) Weight: 235 lb (106.595 kg) IBW/kg (Calculated) : 76.45  Vital Signs: Temp: 99.9 F (37.7 C) (03/07 0053) Temp src: Oral (03/07 0053) BP: 145/72 mmHg (03/07 0054) Pulse Rate: 110 (03/07 0053) Intake/Output from previous day:   Intake/Output from this shift:    Labs:  Recent Labs  03/21/13 0236 03/21/13 0650  WBC 17.2* 13.9*  HGB 14.8 14.1  PLT 139* 129*  CREATININE 0.91 0.88   Estimated Creatinine Clearance: 107.6 ml/min (by C-G formula based on Cr of 0.88). No results found for this basename: VANCOTROUGH, VANCOPEAK, VANCORANDOM, GENTTROUGH, GENTPEAK, GENTRANDOM, TOBRATROUGH, TOBRAPEAK, TOBRARND, AMIKACINPEAK, AMIKACINTROU, AMIKACIN,  in the last 72 hours   Microbiology: No results found for this or any previous visit (from the past 720 hour(s)).  Medical History: Past Medical History  Diagnosis Date  . Diabetes mellitus without complication   . Arthritis     Medications:  Scheduled:  . sodium chloride   Intravenous STAT  . aspirin  325 mg Oral Daily  . furosemide  20 mg Oral Daily  . gabapentin  300 mg Oral TID  . insulin aspart  0-9 Units Subcutaneous TID WC  . insulin glargine  10 Units Subcutaneous QHS   Assessment: 63 yo obese M admitted with worsening cellulitis/ ulcer of RLE.  He was started on Bactrim as an outpatient which he never filled.   WBC elevated.  He is diabetic with elevated CBGs because he has been without insulin for several days.  Renal function is at patient's baseline.   Vancomycin 3/7>>  Goal of Therapy:  Vancomycin trough level 10-15 mcg/ml  Plan:  Vancomycin 1gm IV q12h- will start now since patient only received 1gm in ED ~ 0430 (not adequate load) Check Vancomycin trough at steady state Monitor renal function and cx  data   Elson ClanLilliston, Vinicius Brockman Michelle 03/21/2013,8:38 AM

## 2013-03-21 NOTE — ED Provider Notes (Signed)
CSN: [id number]     Arrival date & time 03/21/13  0025 History   First MD Initiated Contact with Patient 03/21/13 0211     Chief Complaint  Patient presents with  . Hyperglycemia     (Consider location/radiation/quality/duration/timing/severity/associated sxs/prior Treatment) HPI Comments: 63 year old male with a history of diabetes who presents with a complaint of elevated blood sugar. He states that he has been in bed most of the day with shaking chills, he was seen yesterday at the family doctor's office at which time he was prescribed an antibiotic for a potential wound infection from a diabetic wound of the right foot. He has taken a dose of this medication but has not had insulin since 2 days ago. He states that he has run out of his medication at home, he has not yet gotten it refilled. He denies any vomiting or diarrhea, he does have a cough. He does feel dehydrated with cotton mouth and feels as though he is weak. A family member in the room states that he has also had some altered mental status with confusion, "talking out of his head" which is mild but persistent throughout the day.  Patient is a 63 y.o. male presenting with hyperglycemia. The history is provided by the patient and a relative.  Hyperglycemia   Past Medical History  Diagnosis Date  . Diabetes mellitus without complication   . Arthritis    Past Surgical History  Procedure Laterality Date  . Appendectomy     No family history on file. History  Substance Use Topics  . Smoking status: Current Every Day Smoker    Types: Cigarettes  . Smokeless tobacco: Not on file  . Alcohol Use: No    Review of Systems  All other systems reviewed and are negative.      Allergies  Penicillins  Home Medications   Current Outpatient Rx  Name  Route  Sig  Dispense  Refill  . aspirin 325 MG tablet   Oral   Take 325 mg by mouth daily.         . ciprofloxacin (CIPRO) 500 MG tablet   Oral   Take 1 tablet (500 mg  total) by mouth 2 (two) times daily.   20 tablet   0   . furosemide (LASIX) 20 MG tablet   Oral   Take 20 mg by mouth daily.         Marland Kitchen. gabapentin (NEURONTIN) 300 MG capsule   Oral   Take 300 mg by mouth 3 (three) times daily.         . insulin glargine (LANTUS) 100 UNIT/ML injection   Subcutaneous   Inject 12-20 Units into the skin at bedtime.         . Liraglutide (VICTOZA) 18 MG/3ML SOLN injection   Subcutaneous   Inject 0.6-1.8 mg into the skin daily.         Marland Kitchen. lovastatin (MEVACOR) 20 MG tablet   Oral   Take 20 mg by mouth at bedtime.         . metFORMIN (GLUCOPHAGE) 500 MG tablet   Oral   Take 1,000 mg by mouth 2 (two) times daily with a meal.          BP 145/72  Pulse 110  Temp(Src) 99.9 F (37.7 C) (Oral)  Resp 20  Ht 5' 11.5" (1.816 m)  Wt 235 lb (106.595 kg)  BMI 32.32 kg/m2  SpO2 98% Physical Exam  Nursing note and vitals reviewed. Constitutional: He  appears well-developed and well-nourished. No distress.  HENT:  Head: Normocephalic and atraumatic.  Mouth/Throat: No oropharyngeal exudate.  Mucous membranes appear dehydrated and dry  Eyes: Conjunctivae and EOM are normal. Pupils are equal, round, and reactive to light. Right eye exhibits no discharge. Left eye exhibits no discharge. No scleral icterus.  Neck: Normal range of motion. Neck supple. No JVD present. No thyromegaly present.  Cardiovascular: Regular rhythm, normal heart sounds and intact distal pulses.  Exam reveals no gallop and no friction rub.   No murmur heard. Sinus tachycardia, strong pulses at the radial arteries  Pulmonary/Chest: Effort normal and breath sounds normal. No respiratory distress. He has no wheezes. He has no rales.  Abdominal: Soft. Bowel sounds are normal. He exhibits no distension and no mass. There is no tenderness.  Musculoskeletal: Normal range of motion. He exhibits edema. He exhibits no tenderness.  Scant bilateral lower extremity edema, erythema of the  right lower extremity from the mid lower extremity below the knee through the foot. Erythema present, no pustules, no vesicles. This area is warm as well.  Lymphadenopathy:    He has no cervical adenopathy.  Neurological: He is alert. Coordination normal.  Skin: Skin is warm and dry. There is erythema.  Diabetic wound to the right lateral foot, there is a scabbed over area, no underlying ulceration that is visualized, no pus, no surrounding cellulitis of his wound. Erythema of the right lower extremity as explained above  Psychiatric: He has a normal mood and affect. His behavior is normal.    ED Course  Procedures (including critical care time) Labs Review Labs Reviewed  BASIC METABOLIC PANEL - Abnormal; Notable for the following:    Sodium 129 (*)    Chloride 89 (*)    Glucose, Bld 449 (*)    GFR calc non Af Amer 88 (*)    All other components within normal limits  CBC WITH DIFFERENTIAL - Abnormal; Notable for the following:    WBC 17.2 (*)    Platelets 139 (*)    Neutrophils Relative % 85 (*)    Neutro Abs 14.6 (*)    Lymphocytes Relative 9 (*)    All other components within normal limits  CBG MONITORING, ED - Abnormal; Notable for the following:    Glucose-Capillary 534 (*)    All other components within normal limits  CBG MONITORING, ED - Abnormal; Notable for the following:    Glucose-Capillary 494 (*)    All other components within normal limits  URINALYSIS, ROUTINE W REFLEX MICROSCOPIC   Imaging Review Dg Chest 2 View  03/21/2013   CLINICAL DATA:  Shortness of breath, fever, cough  EXAM: CHEST  2 VIEW  COMPARISON:  None available  FINDINGS: The cardiac and mediastinal silhouettes are within normal limits.  The lungs are normally inflated. No airspace consolidation, pleural effusion, or pulmonary edema is identified. There is no pneumothorax.  No acute osseous abnormality identified. Multilevel degenerative changes noted within the visualized spine.  IMPRESSION: No active  cardiopulmonary disease.   Electronically Signed   By: Rise Mu M.D.   On: 03/21/2013 03:43     EKG Interpretation None      MDM   Final diagnoses:  Hyperglycemia  Cellulitis of right lower leg    At this time the patient is able to explain his complaints, he is at a baseline neurologic status, he does have evidence of early cellulitis of the right lower extremities, he does feel febrile, pulse is 110 and  I suspect he has a cellulitis. Given his hyperglycemia he will need IV fluids, insulin and possibly admission to the hospital.  The patient does have a leukocytosis of over 17,000, this is fitting with his tachycardia and low-grade fever and is consistent with a cellulitis. He will be given insulin, fluids and vancomycin and admitted to the hospitalist service.   Meds given in ED:  Medications  dextrose 5 %-0.45 % sodium chloride infusion (not administered)  0.9 %  sodium chloride infusion (not administered)    Followed by  0.9 %  sodium chloride infusion (not administered)  vancomycin (VANCOCIN) IVPB 1000 mg/200 mL premix (not administered)  insulin aspart (novoLOG) injection 10 Units (not administered)  sodium chloride 0.9 % bolus 1,000 mL (1,000 mLs Intravenous New Bag/Given 03/21/13 0248)    New Prescriptions   No medications on file      Vida Roller, MD 03/21/13 [phone]

## 2013-03-21 NOTE — Progress Notes (Signed)
PROGRESS NOTE  Mitchell ButtGary [name] GEX:[id number] DOB: [birth year] DOA: 03/21/2013 PCP: Kirstie PeriSHAH,ASHISH, MD  Summary: 63 year old man with history of diabetes presented with pain in his right leg, seen by PCP day prior to admission and prescribed antibiotic for ulcer on right foot. Afterwards developed erythema, chills and came to the emergency department where he was admitted for treatment of right lower extremity cellulitis and hyperglycemia. He ran out of his insulin 4 days ago recently lost his insurance but reportedly has Medicaid.  Assessment/Plan: 1. RLE cellulitis, possible early sepsis. Rapidly improving. Hemodynamically stable.  2. Possible acute encephalopathy. Mentation intact at this point. 3. Hyperglycemia without ketosis. Control remains poor. Hemoglobin A1c in process. Secondary to noncompliance (patient has been out of insulin for several days). 4. Diabetes mellitus type 2. Outpatient medications include Lantus, Victoza, metformin. 5. Mild from cytopenia. Present on admission. Significance unclear.   Cellulitis appears to be rapidly improving. Change to oral antibiotics.  Hyperglycemia persists. Add meal coverage, change sliding scale to moderate, increase Lantus dose in AM.  Replace potassium  CBC in the morning  Anticipate discharge 3/8  Code Status: full DVT prophylaxis: SCDs given low platelets Family Communication: none present Disposition Plan: home  Brendia Sacksaniel Kipling Graser, MD  [facility]  Pager [phone] If 7PM-7AM, please contact night-coverage at [email or web address], password [email or web address] 03/21/2013, 10:38 AM  LOS: 0 days   Consultants:    Procedures:    Antibiotics:  Vancomycin 3/7  Doxycycline 3/7 >>  HPI/Subjective: Late entry. He feels much better today. No redness of his right leg is much improved. He feels more "clear headed" today.  Objective: Filed Vitals:   03/21/13 0053 03/21/13 0054  BP:  145/72  Pulse: 110   Temp: 99.9 F (37.7 C)   TempSrc: Oral     Resp: 20   Height: 5' 11.5" (1.816 m)   Weight: 106.595 kg (235 lb)   SpO2: 98%     Intake/Output Summary (Last 24 hours) at 03/21/13 1038 Last data filed at 03/21/13 0900  Gross per 24 hour  Intake    360 ml  Output      0 ml  Net    360 ml     Filed Weights   03/21/13 0053  Weight: 106.595 kg (235 lb)    Exam:   Afebrile, vital signs stable.  Gen. Appears calm and comfortable. Speech fluent and clear.  Cardiovascular regular rate and rhythm. No murmur, rub or gallop.  Respiratory clear to auscultation bilaterally. No wheezes, rales or rhonchi. Normal respiratory effort.  Right lower extremity. Minimal erythema lower leg. The foot appears uninvolved at this point. Nontender. 2+ DP. Large callus over the fifth metatarsal.  Data Reviewed:  Capillary blood sugars remain elevated, and improved from yesterday. Basic metabolic panel notable for potassium 3.4.  WBC improved, 13.9. Platelet count slightly lower 129.  Chest x-ray no acute disease.  Scheduled Meds: . sodium chloride   Intravenous STAT  . aspirin  325 mg Oral Daily  . furosemide  20 mg Oral Daily  . gabapentin  300 mg Oral TID  . insulin aspart  0-9 Units Subcutaneous TID WC  . insulin glargine  10 Units Subcutaneous QHS  . vancomycin  1,000 mg Intravenous Q12H   Continuous Infusions: . sodium chloride 100 mL/hr at 03/21/13 [id number]    Principal Problem:   Cellulitis Active Problems:   Hyperglycemia   DM (diabetes mellitus)   Noncompliance with medications due to cost issues   Time spent 20 minutes

## 2013-03-21 NOTE — H&P (Signed)
PCP:   Kirstie Peri, MD   Chief Complaint:  Feeling awful  HPI: 63 yo male h/o iddm comes in with feeling bad all day and pain in his right leg.  Pt went to see his pcp yesterday for an ulcer on his right foot, was placed on bactrim.  There was no redness to his leg at that time.  Throughout the day, he just was feeling ill all day with chills, then later in the evening noticed the redness to his leg, so came to ED.  No n/v/d.  Pt ran out of his insulin 4 days ago and did not get it refilled b/c he lost his health insurance and cant afford it.  However he tells me he was just approved for medicaid about 3 weeks ago, just hasnt checked into using it to get his medications.    Review of Systems:  Positive and negative as per HPI otherwise all other systems are negative  Past Medical History: Past Medical History  Diagnosis Date  . Diabetes mellitus without complication   . Arthritis    Past Surgical History  Procedure Laterality Date  . Appendectomy      Medications: Prior to Admission medications   Medication Sig Start Date End Date Taking? Authorizing Provider  aspirin 325 MG tablet Take 325 mg by mouth daily.   Yes Historical Provider, MD  ciprofloxacin (CIPRO) 500 MG tablet Take 1 tablet (500 mg total) by mouth 2 (two) times daily. 03/18/12  Yes Benny Lennert, MD  furosemide (LASIX) 20 MG tablet Take 20 mg by mouth daily.   Yes Historical Provider, MD  gabapentin (NEURONTIN) 300 MG capsule Take 300 mg by mouth 3 (three) times daily.   Yes Historical Provider, MD  insulin glargine (LANTUS) 100 UNIT/ML injection Inject 12-20 Units into the skin at bedtime.   Yes Historical Provider, MD  Liraglutide (VICTOZA) 18 MG/3ML SOLN injection Inject 0.6-1.8 mg into the skin daily.   Yes Historical Provider, MD  lovastatin (MEVACOR) 20 MG tablet Take 20 mg by mouth at bedtime.   Yes Historical Provider, MD  metFORMIN (GLUCOPHAGE) 500 MG tablet Take 1,000 mg by mouth 2 (two) times daily with a  meal.   Yes Historical Provider, MD    Allergies:   Allergies  Allergen Reactions  . Penicillins Itching and Rash    Social History:  reports that he has been smoking Cigarettes.  He has been smoking about 0.00 packs per day. He does not have any smokeless tobacco history on file. He reports that he does not drink alcohol or use illicit drugs.  Family History: Neg   Physical Exam: Filed Vitals:   03/21/13 0053 03/21/13 0054  BP:  145/72  Pulse: 110   Temp: 99.9 F (37.7 C)   TempSrc: Oral   Resp: 20   Height: 5' 11.5" (1.816 m)   Weight: 106.595 kg (235 lb)   SpO2: 98%    General appearance: alert, cooperative and no distress Head: Normocephalic, without obvious abnormality, atraumatic Eyes: negative Nose: Nares normal. Septum midline. Mucosa normal. No drainage or sinus tenderness. Neck: no JVD and supple, symmetrical, trachea midline Lungs: clear to auscultation bilaterally Heart: regular rate and rhythm, S1, S2 normal, no murmur, click, rub or gallop Abdomen: soft, non-tender; bowel sounds normal; no masses,  no organomegaly Extremities: extremities normal, atraumatic, no cyanosis or edema Pulses: 2+ and symmetric Skin: Skin color, texture, turgor normal. No rashes or lesions except some mild erythema starting on his right anterior ankle/chin,  Lateral rt foot has a chronic appearing ulcer with no surrounding erythema or discharge. Neurologic: Grossly normal   Labs on Admission:   Recent Labs  03/21/13 0236  NA 129*  K 4.0  CL 89*  CO2 26  GLUCOSE 449*  BUN 13  CREATININE 0.91  CALCIUM 8.9    Recent Labs  03/21/13 0236  WBC 17.2*  NEUTROABS 14.6*  HGB 14.8  HCT 42.5  MCV 87.3  PLT 139*    Radiological Exams on Admission: Dg Chest 2 View  03/21/2013   CLINICAL DATA:  Shortness of breath, fever, cough  EXAM: CHEST  2 VIEW  COMPARISON:  None available  FINDINGS: The cardiac and mediastinal silhouettes are within normal limits.  The lungs are  normally inflated. No airspace consolidation, pleural effusion, or pulmonary edema is identified. There is no pneumothorax.  No acute osseous abnormality identified. Multilevel degenerative changes noted within the visualized spine.  IMPRESSION: No active cardiopulmonary disease.   Electronically Signed   By: Rise MuBenjamin  McClintock M.D.   On: 03/21/2013 03:43    Assessment/Plan  63 yo male with rle cellulitis and uncontrolled diabetes  Principal Problem:   Cellulitis  Place on vanc, should improve in the next 24 hours or so.  Active Problems:   Hyperglycemia  Given 10 units of reg insulin in ed, gap is less than 15.  Will restart his lantus now also give 10 units of lantus, then ssi sensitive scale.  Ivf.     DM (diabetes mellitus)   Noncompliance with medications due to cost issues  Need to find out if he indeed does have medicaid??  To help pay for his meds chronically at home.  Obs, likely d/c home tomorrow on oral abx.  Maudy Yonan A 03/21/2013, 4:20 AM

## 2013-03-22 LAB — CBC
HCT: 41.3 % (ref 39.0–52.0)
HEMOGLOBIN: 13.6 g/dL (ref 13.0–17.0)
MCH: 29.6 pg (ref 26.0–34.0)
MCHC: 32.9 g/dL (ref 30.0–36.0)
MCV: 89.8 fL (ref 78.0–100.0)
Platelets: 124 10*3/uL — ABNORMAL LOW (ref 150–400)
RBC: 4.6 MIL/uL (ref 4.22–5.81)
RDW: 12.4 % (ref 11.5–15.5)
WBC: 10.6 10*3/uL — ABNORMAL HIGH (ref 4.0–10.5)

## 2013-03-22 LAB — BASIC METABOLIC PANEL
BUN: 10 mg/dL (ref 6–23)
CALCIUM: 8.5 mg/dL (ref 8.4–10.5)
CO2: 25 mEq/L (ref 19–32)
CREATININE: 0.76 mg/dL (ref 0.50–1.35)
Chloride: 99 mEq/L (ref 96–112)
GFR calc Af Amer: >90 mL/min (ref >90)
Glucose, Bld: 271 mg/dL — ABNORMAL HIGH (ref 70–99)
POTASSIUM: 4.1 meq/L (ref 3.7–5.3)
Sodium: 136 mEq/L — ABNORMAL LOW (ref 137–147)

## 2013-03-22 LAB — GLUCOSE, CAPILLARY: GLUCOSE-CAPILLARY: 252 mg/dL — AB (ref 70–99)

## 2013-03-22 MED ORDER — INSULIN GLARGINE 100 UNIT/ML ~~LOC~~ SOLN
15.0000 [IU] | Freq: Every day | SUBCUTANEOUS | Status: DC
  Administered 2013-03-22: 15 [IU] via SUBCUTANEOUS
  Filled 2013-03-22 (×2): qty 0.15

## 2013-03-22 MED ORDER — INSULIN ASPART 100 UNIT/ML FLEXPEN: 6.0000 [IU] | PEN_INJECTOR | Freq: Three times a day (TID) | SUBCUTANEOUS | Status: DC

## 2013-03-22 MED ORDER — INSULIN ASPART 100 UNIT/ML ~~LOC~~ SOLN: 0.0000 [IU] | Freq: Every day | SUBCUTANEOUS | Status: DC

## 2013-03-22 MED ORDER — INSULIN GLARGINE 100 UNIT/ML SOLOSTAR PEN: 15.0000 [IU] | PEN_INJECTOR | Freq: Every day | SUBCUTANEOUS | Status: DC

## 2013-03-22 MED ORDER — METFORMIN HCL 500 MG PO TABS: 1000.0000 mg | ORAL_TABLET | Freq: Two times a day (BID) | ORAL | Status: DC

## 2013-03-22 MED ORDER — LOVASTATIN 20 MG PO TABS: 20.0000 mg | ORAL_TABLET | Freq: Every day | ORAL | Status: DC

## 2013-03-22 MED ORDER — INSULIN GLARGINE 100 UNIT/ML ~~LOC~~ SOLN: 20.0000 [IU] | Freq: Every day | SUBCUTANEOUS | Status: DC

## 2013-03-22 MED ORDER — GABAPENTIN 300 MG PO CAPS: 300.0000 mg | ORAL_CAPSULE | Freq: Three times a day (TID) | ORAL | Status: DC

## 2013-03-22 MED ORDER — DOXYCYCLINE HYCLATE 100 MG PO TABS: 100.0000 mg | ORAL_TABLET | Freq: Two times a day (BID) | ORAL | Status: DC

## 2013-03-22 MED ORDER — INSULIN ASPART 100 UNIT/ML ~~LOC~~ SOLN
0.0000 [IU] | Freq: Three times a day (TID) | SUBCUTANEOUS | Status: DC
  Administered 2013-03-22 (×2): 11 [IU] via SUBCUTANEOUS

## 2013-03-22 MED ORDER — INSULIN ASPART 100 UNIT/ML ~~LOC~~ SOLN
6.0000 [IU] | Freq: Three times a day (TID) | SUBCUTANEOUS | Status: DC
  Administered 2013-03-22 (×2): 6 [IU] via SUBCUTANEOUS

## 2013-03-22 MED ORDER — FUROSEMIDE 20 MG PO TABS: 20.0000 mg | ORAL_TABLET | Freq: Every day | ORAL | Status: AC

## 2013-03-22 NOTE — Progress Notes (Signed)
Pt concerned this morning about being able to get medications that he is taking here in the hospital since him PCP has not given him any of these, such as gabapentin and daily aspirin. Pt would like a prescription for these when he leaves today so that he may get them filled.

## 2013-03-22 NOTE — Discharge Summary (Signed)
Physician Discharge Summary  Mitchell [name] [id number] DOB: 1950/10/14 DOA: 03/21/2013  PCP: Kirstie PeriSHAH,ASHISH, MD  Admit date: 03/21/2013 Discharge date: 03/22/2013  Recommendations for Outpatient Follow-up:  1. Resolution of right lower extremity cellulitis 2. Diabetes mellitus type 2 uncontrolled with hemoglobin A1c 15.4 3. Callus right fifth metatarsal without evidence of infection. 4. Mild thrombocytopenia. Present on admission. Likely related to acute illness. Consider repeat CBC as an outpatient. 5. Consider adding ACE inhibitor   Follow-up Information   [facility] Schedule an appointment as soon as possible for a visit in 1 week.   Specialty:  Internal Medicine   Contact information:   [address]  [place] [address] [phone]       Follow up with Dallas SchimkeMCKINNEY,BENJAMIN IVAN, DPM. Schedule an appointment as soon as possible for a visit in 2 weeks. (for foot care)    Specialty:  Podiatry   Contact information:   [address] [place] [address] [phone]      Discharge Diagnoses:  1. Right lower extremity cellulitis 2. Possible early sepsis on admission 3. Hyperglycemia without ketosis 4. Possible acute encephalopathy 5. Dehydration 6. Diabetes mellitus type 2 uncontrolled with hemoglobin A1c 15.4. 7. Callus right fifth metatarsal  8. Mild thrombocytopenia  Discharge Condition: Improved Disposition: Home  Diet recommendation: Heart healthy diabetic diet  Filed Weights   03/21/13 0053 03/22/13 0409  Weight: 106.595 kg (235 lb) 101.833 kg (224 lb 8 oz)    History of present illness:  63 year old man with history of diabetes presented with pain in his right leg, seen by PCP day prior to admission and prescribed antibiotic for ulcer on right foot. Afterwards developed erythema, chills and came to the emergency department where he was admitted for treatment of right lower extremity cellulitis and hyperglycemia. He ran out of his insulin 4 days  ago recently lost his insurance but reportedly has Medicaid.  Hospital Course:  Patient was treated with IV vancomycin for right lower extremity cellulitis, rapidly improve and transitioned to oral doxycycline on discharge. Excellent perfusion, pulse is no evidence of complicating features. He does have a callus on the right foot and outpatient followup with podiatry as recommended. Hyperglycemia without ketosis responded to resumption of insulin. There was a question of acute encephalopathy which was not demonstrated during hospitalization. He now has Medicaid, and is confident he can afford his prescriptions and requests to restart on Lantus and Novolog. He does not want to take Victoza. He has been counseled to check his blood sugars and followup.  1. RLE cellulitis, possible early sepsis. Rapidly improving. Hemodynamically stable.  2. Possible acute encephalopathy. Resolved, likely secondary to hypoglycemia. 3. Hyperglycemia without ketosis. Improved. Secondary to noncompliance (patient has been out of insulin for several days). 4. Dehydration on admission. Resolved. Likely secondary to hyperglycemia. 5. Diabetes mellitus type 2. Uncontrolled with hemoglobin A1c of 15.4. Secondary to noncompliance. 6. Callus right fifth metatarsal without evidence of infection. Close outpatient followup with primary care physician, suggest podiatry referral. 7. Mild thrombocytopenia. Present on admission. Stable. Likely related to acute illness. Followup as an outpatient.  Consultants: none Procedures: none Antibiotics:  Vancomycin 3/7  Doxycycline 3/7 >> 3/13  Discharge Instructions  Discharge Orders   Future Orders Complete By Expires   Activity as tolerated - No restrictions  As directed    Diet - low sodium heart healthy  As directed    Diet Carb Modified  As directed    Discharge instructions  As directed  Comments:     Be sure to followup with your primary care physician within one week. Check  your blood sugars at least twice a day keep a log and bring to all your appointments. Call your physician or seek immediate medical attention for blood sugars greater than 400 or less than 70. Seek attention for fever, recurrent leg pain, redness or worsening condition. It is recommended that you followup with a podiatrist.       Medication List    STOP taking these medications       insulin aspart 100 UNIT/ML injection  Commonly known as:  novoLOG  Replaced by:  insulin aspart 100 UNIT/ML FlexPen     insulin glargine 100 UNIT/ML injection  Commonly known as:  LANTUS  Replaced by:  Insulin Glargine 100 UNIT/ML Solostar Pen     VICTOZA 18 MG/3ML Soln injection  Generic drug:  Liraglutide      TAKE these medications       aspirin 325 MG tablet  Take 325 mg by mouth daily.     doxycycline 100 MG tablet  Commonly known as:  VIBRA-TABS  Take 1 tablet (100 mg total) by mouth every 12 (twelve) hours.     furosemide 20 MG tablet  Commonly known as:  LASIX  Take 1 tablet (20 mg total) by mouth daily.     gabapentin 300 MG capsule  Commonly known as:  NEURONTIN  Take 1 capsule (300 mg total) by mouth 3 (three) times daily.     insulin aspart 100 UNIT/ML FlexPen  Commonly known as:  NOVOLOG  Inject 6 Units into the skin 3 (three) times daily with meals.     Insulin Glargine 100 UNIT/ML Solostar Pen  Commonly known as:  LANTUS  Inject 15 Units into the skin daily.     lovastatin 20 MG tablet  Commonly known as:  MEVACOR  Take 1 tablet (20 mg total) by mouth at bedtime.     metFORMIN 500 MG tablet  Commonly known as:  GLUCOPHAGE  Take 2 tablets (1,000 mg total) by mouth 2 (two) times daily with a meal.       Allergies  Allergen Reactions  . Penicillins Itching and Rash    The results of significant diagnostics from this hospitalization (including imaging, microbiology, ancillary and laboratory) are listed below for reference.    Significant Diagnostic Studies: Dg  Chest 2 View  03/21/2013   CLINICAL DATA:  Shortness of breath, fever, cough  EXAM: CHEST  2 VIEW  COMPARISON:  None available  FINDINGS: The cardiac and mediastinal silhouettes are within normal limits.  The lungs are normally inflated. No airspace consolidation, pleural effusion, or pulmonary edema is identified. There is no pneumothorax.  No acute osseous abnormality identified. Multilevel degenerative changes noted within the visualized spine.  IMPRESSION: No active cardiopulmonary disease.   Electronically Signed   By: Rise Mu M.D.   On: 03/21/2013 03:43   Labs: Basic Metabolic Panel:  Recent Labs Lab 03/21/13 0236 03/21/13 0650 03/22/13 0559  NA 129* 133* 136*  K 4.0 3.4* 4.1  CL 89* 95* 99  CO2 26 26 25   GLUCOSE 449* 290* 271*  BUN 13 11 10   CREATININE 0.91 0.88 0.76  CALCIUM 8.9 8.4 8.5   CBC:  Recent Labs Lab 03/21/13 0236 03/21/13 0650 03/22/13 0559  WBC 17.2* 13.9* 10.6*  NEUTROABS 14.6*  --   --   HGB 14.8 14.1 13.6  HCT 42.5 41.2 41.3  MCV 87.3 88.0  89.8  PLT 139* 129* 124*   CBG:  Recent Labs Lab 03/21/13 0736 03/21/13 1121 03/21/13 1625 03/21/13 2049 03/22/13 0719  GLUCAP 279* 329* 333* 314* 252*    Principal Problem:   Cellulitis Active Problems:   Hyperglycemia   DM (diabetes mellitus)   Noncompliance with medications due to cost issues   Time coordinating discharge: 35 minutes  Signed:  Brendia Sacks, MD [facility] 03/22/2013, 1:37 PM

## 2013-03-22 NOTE — Progress Notes (Signed)
PROGRESS NOTE  Mitchell ButtGary [name] UJW:[id number] DOB: August 22, 1950 DOA: 03/21/2013 PCP: Kirstie PeriSHAH,ASHISH, MD  Summary: 63 year old man with history of diabetes presented with pain in his right leg, seen by PCP day prior to admission and prescribed antibiotic for ulcer on right foot. Afterwards developed erythema, chills and came to the emergency department where he was admitted for treatment of right lower extremity cellulitis and hyperglycemia. He ran out of his insulin 4 days ago recently lost his insurance but reportedly has Medicaid.  Assessment/Plan: 1. RLE cellulitis, possible early sepsis. Rapidly improving. Hemodynamically stable.  2. Possible acute encephalopathy. Resolved, likely secondary to hypoglycemia. 3. Hyperglycemia without ketosis. Improved. Secondary to noncompliance (patient has been out of insulin for several days). 4. Dehydration on admission. Resolved. Likely secondary to hyperglycemia. 5. Diabetes mellitus type 2. Uncontrolled with hemoglobin A1c of 15.4. Secondary to noncompliance. 6. Callus right fifth metatarsal without evidence of infection. Close outpatient followup with primary care physician, suggest podiatry referral. 7. Mild thrombocytopenia. Present on admission. Stable. Likely related to acute illness. Followup as an outpatient.   Cellulitis appears nearly resolved. Finish outpatient course of antibiotics.  Patient now has Medicaid, cannot afford his prescriptions. Discharge home on Lantus, Novolog meal coverage, metformin. Recommend followup with primary care physician one week. Check blood sugars BID or more often and record in log.  Code Status: full DVT prophylaxis: SCDs given low platelets Family Communication: none present Disposition Plan: home  Brendia Sacksaniel Goodrich, MD  [facility]  Pager [phone] If 7PM-7AM, please contact night-coverage at [email or web address], password [email or web address] 03/22/2013, 1:23 PM  LOS: 1 day    Consultants:    Procedures:    Antibiotics:  Vancomycin 3/7  Doxycycline 3/7 >> 3/13  HPI/Subjective: Feels very good today. No pain in the leg. Ambulating without difficulty. Eating well. Ready to go home.  Objective: Filed Vitals:   03/21/13 0054 03/21/13 1446 03/21/13 2015 03/22/13 0409  BP: 145/72 109/65 112/62 110/65  Pulse:  73 88 86  Temp:  97.9 F (36.6 C) 99.1 F (37.3 C) 98.4 F (36.9 C)  TempSrc:  Oral Oral Oral  Resp:  20 20 20   Height:      Weight:    101.833 kg (224 lb 8 oz)  SpO2:  95% 95% 93%    Intake/Output Summary (Last 24 hours) at 03/22/13 1323 Last data filed at 03/22/13 0900  Gross per 24 hour  Intake   1000 ml  Output      0 ml  Net   1000 ml     Filed Weights   03/21/13 0053 03/22/13 0409  Weight: 106.595 kg (235 lb) 101.833 kg (224 lb 8 oz)    Exam:   Afebrile, vital signs stable. No hypoxia.  General. Appears calm and comfortable. Eating lunch.  Cardiovascular regular rate and rhythm. No murmur, rub or gallop. No lower extremity edema.  Respiratory. Clear to auscultation bilaterally. No wheezes, rales or rhonchi. Normal respiratory effort.  Skin. Right lower extremity with minimal erythema mid lower leg to above the ankle. Minimal edema. Nontender. No fluctuance. DP 2+. Normal perfusion of the foot.  Data Reviewed:  Capillary blood sugars overall improved. Fasting blood sugar 271.  WBC now nearly normal, 10.6. Mild thrombocytopenia present on admission without significant change from 124  Potassium 4.1. Creatinine normal.  Hemoglobin A1c 15.4.  Scheduled Meds: . aspirin  325 mg Oral Daily  . doxycycline  100 mg Oral Q12H  . furosemide  20 mg Oral Daily  . gabapentin  300 mg Oral  TID  . insulin aspart  0-20 Units Subcutaneous TID WC  . insulin aspart  0-5 Units Subcutaneous QHS  . insulin aspart  6 Units Subcutaneous TID WC  . insulin glargine  15 Units Subcutaneous Daily   Continuous Infusions:     Principal Problem:   Cellulitis Active Problems:   Hyperglycemia   DM (diabetes mellitus)   Noncompliance with medications due to cost issues

## 2013-03-22 NOTE — Progress Notes (Signed)
Pt educated concerning medication and given discharge instructions. Pt given 7 prescriptions and IV removed and taken to the main entrance via wheelchair by NT.

## 2013-03-23 LAB — GLUCOSE, CAPILLARY: GLUCOSE-CAPILLARY: 285 mg/dL — AB (ref 70–99)

## 2014-05-09 ENCOUNTER — Encounter (HOSPITAL_COMMUNITY): Payer: Self-pay | Admitting: Emergency Medicine

## 2014-05-09 ENCOUNTER — Emergency Department (HOSPITAL_COMMUNITY): Payer: Medicare Other

## 2014-05-09 ENCOUNTER — Inpatient Hospital Stay (HOSPITAL_COMMUNITY)
Admission: EM | Admit: 2014-05-09 | Discharge: 2014-05-12 | DRG: 603 | Disposition: A | Discharge status: Home / Self Care | Payer: Medicare Other | Attending: Internal Medicine | Admitting: Internal Medicine

## 2014-05-09 DIAGNOSIS — Z794 Long term (current) use of insulin: Secondary | ICD-10-CM | POA: Diagnosis not present

## 2014-05-09 DIAGNOSIS — E669 Obesity, unspecified: Secondary | ICD-10-CM | POA: Diagnosis present

## 2014-05-09 DIAGNOSIS — Z880 Allergy status to penicillin: Secondary | ICD-10-CM | POA: Diagnosis not present

## 2014-05-09 DIAGNOSIS — E1165 Type 2 diabetes mellitus with hyperglycemia: Secondary | ICD-10-CM | POA: Diagnosis present

## 2014-05-09 DIAGNOSIS — G629 Polyneuropathy, unspecified: Secondary | ICD-10-CM

## 2014-05-09 DIAGNOSIS — E785 Hyperlipidemia, unspecified: Secondary | ICD-10-CM | POA: Diagnosis present

## 2014-05-09 DIAGNOSIS — L97509 Non-pressure chronic ulcer of other part of unspecified foot with unspecified severity: Secondary | ICD-10-CM | POA: Diagnosis present

## 2014-05-09 DIAGNOSIS — Z23 Encounter for immunization: Secondary | ICD-10-CM

## 2014-05-09 DIAGNOSIS — E1142 Type 2 diabetes mellitus with diabetic polyneuropathy: Secondary | ICD-10-CM | POA: Diagnosis present

## 2014-05-09 DIAGNOSIS — F17210 Nicotine dependence, cigarettes, uncomplicated: Secondary | ICD-10-CM | POA: Diagnosis present

## 2014-05-09 DIAGNOSIS — R0989 Other specified symptoms and signs involving the circulatory and respiratory systems: Secondary | ICD-10-CM | POA: Diagnosis present

## 2014-05-09 DIAGNOSIS — L03115 Cellulitis of right lower limb: Secondary | ICD-10-CM

## 2014-05-09 DIAGNOSIS — Z9111 Patient's noncompliance with dietary regimen: Secondary | ICD-10-CM | POA: Diagnosis present

## 2014-05-09 DIAGNOSIS — E119 Type 2 diabetes mellitus without complications: Secondary | ICD-10-CM

## 2014-05-09 DIAGNOSIS — E780 Pure hypercholesterolemia: Secondary | ICD-10-CM | POA: Diagnosis present

## 2014-05-09 DIAGNOSIS — IMO0001 Reserved for inherently not codable concepts without codable children: Secondary | ICD-10-CM

## 2014-05-09 DIAGNOSIS — Z9114 Patient's other noncompliance with medication regimen: Secondary | ICD-10-CM | POA: Diagnosis present

## 2014-05-09 DIAGNOSIS — E11621 Type 2 diabetes mellitus with foot ulcer: Secondary | ICD-10-CM

## 2014-05-09 DIAGNOSIS — L02419 Cutaneous abscess of limb, unspecified: Secondary | ICD-10-CM | POA: Diagnosis not present

## 2014-05-09 DIAGNOSIS — E871 Hypo-osmolality and hyponatremia: Secondary | ICD-10-CM | POA: Diagnosis present

## 2014-05-09 DIAGNOSIS — R739 Hyperglycemia, unspecified: Secondary | ICD-10-CM

## 2014-05-09 DIAGNOSIS — M1990 Unspecified osteoarthritis, unspecified site: Secondary | ICD-10-CM | POA: Diagnosis present

## 2014-05-09 DIAGNOSIS — M79671 Pain in right foot: Secondary | ICD-10-CM | POA: Diagnosis present

## 2014-05-09 DIAGNOSIS — L97519 Non-pressure chronic ulcer of other part of right foot with unspecified severity: Secondary | ICD-10-CM | POA: Diagnosis present

## 2014-05-09 DIAGNOSIS — Z91148 Patient's other noncompliance with medication regimen for other reason: Secondary | ICD-10-CM

## 2014-05-09 HISTORY — DX: Cellulitis of right lower limb: L03.115

## 2014-05-09 HISTORY — DX: Obesity, unspecified: E66.9

## 2014-05-09 HISTORY — DX: Pure hypercholesterolemia, unspecified: E78.00

## 2014-05-09 LAB — COMPREHENSIVE METABOLIC PANEL
ALBUMIN: 3.5 g/dL (ref 3.5–5.2)
ALK PHOS: 136 U/L — AB (ref 39–117)
ALT: 19 U/L (ref 0–53)
AST: 16 U/L (ref 0–37)
Anion gap: 10 (ref 5–15)
BILIRUBIN TOTAL: 0.6 mg/dL (ref 0.3–1.2)
BUN: 13 mg/dL (ref 6–23)
CO2: 27 mmol/L (ref 19–32)
Calcium: 8.6 mg/dL (ref 8.4–10.5)
Chloride: 93 mmol/L — ABNORMAL LOW (ref 96–112)
Creatinine, Ser: 0.89 mg/dL (ref 0.50–1.35)
GFR calc Af Amer: >90 mL/min (ref >90)
GFR calc non Af Amer: 88 mL/min — ABNORMAL LOW (ref >90)
Glucose, Bld: 615 mg/dL (ref 70–99)
Potassium: 4 mmol/L (ref 3.5–5.1)
SODIUM: 130 mmol/L — AB (ref 135–145)
TOTAL PROTEIN: 6.8 g/dL (ref 6.0–8.3)

## 2014-05-09 LAB — URINALYSIS, ROUTINE W REFLEX MICROSCOPIC
BILIRUBIN URINE: NEGATIVE
Glucose, UA: >1000 mg/dL — AB
Hgb urine dipstick: NEGATIVE
KETONES UR: NEGATIVE mg/dL
Leukocytes, UA: NEGATIVE
Nitrite: NEGATIVE
PROTEIN: NEGATIVE mg/dL
Specific Gravity, Urine: 1.02 (ref 1.005–1.030)
Urobilinogen, UA: 0.2 mg/dL (ref 0.0–1.0)
pH: 5 (ref 5.0–8.0)

## 2014-05-09 LAB — CBC WITH DIFFERENTIAL/PLATELET
Basophils Absolute: 0 10*3/uL (ref 0.0–0.1)
Basophils Relative: 0 % (ref 0–1)
EOS PCT: 2 % (ref 0–5)
Eosinophils Absolute: 0.2 10*3/uL (ref 0.0–0.7)
HCT: 46.7 % (ref 39.0–52.0)
HEMOGLOBIN: 15.9 g/dL (ref 13.0–17.0)
LYMPHS ABS: 1.5 10*3/uL (ref 0.7–4.0)
Lymphocytes Relative: 19 % (ref 12–46)
MCH: 30.1 pg (ref 26.0–34.0)
MCHC: 34 g/dL (ref 30.0–36.0)
MCV: 88.4 fL (ref 78.0–100.0)
Monocytes Absolute: 0.6 10*3/uL (ref 0.1–1.0)
Monocytes Relative: 8 % (ref 3–12)
Neutro Abs: 5.5 10*3/uL (ref 1.7–7.7)
Neutrophils Relative %: 71 % (ref 43–77)
PLATELETS: 182 10*3/uL (ref 150–400)
RBC: 5.28 MIL/uL (ref 4.22–5.81)
RDW: 13.5 % (ref 11.5–15.5)
WBC: 7.8 10*3/uL (ref 4.0–10.5)

## 2014-05-09 LAB — GLUCOSE, CAPILLARY: GLUCOSE-CAPILLARY: 227 mg/dL — AB (ref 70–99)

## 2014-05-09 LAB — I-STAT CG4 LACTIC ACID, ED
Lactic Acid, Venous: 2.99 mmol/L (ref 0.5–2.0)
Lactic Acid, Venous: 1.9 mmol/L (ref 0.5–2.0)

## 2014-05-09 LAB — URINE MICROSCOPIC-ADD ON

## 2014-05-09 LAB — CBG MONITORING, ED
GLUCOSE-CAPILLARY: 410 mg/dL — AB (ref 70–99)
GLUCOSE-CAPILLARY: 340 mg/dL — AB (ref 70–99)

## 2014-05-09 LAB — MRSA PCR SCREENING: MRSA by PCR: NEGATIVE

## 2014-05-09 MED ORDER — HYDROCODONE-ACETAMINOPHEN 5-325 MG PO TABS
1.0000 | ORAL_TABLET | ORAL | Status: DC | PRN
  Administered 2014-05-09 – 2014-05-12 (×8): 1 via ORAL
  Filled 2014-05-09 (×7): qty 1

## 2014-05-09 MED ORDER — IBUPROFEN 800 MG PO TABS
800.0000 mg | ORAL_TABLET | Freq: Once | ORAL | Status: AC
  Administered 2014-05-09: 800 mg via ORAL
  Filled 2014-05-09: qty 1

## 2014-05-09 MED ORDER — GABAPENTIN 300 MG PO CAPS
300.0000 mg | ORAL_CAPSULE | Freq: Three times a day (TID) | ORAL | Status: DC
  Administered 2014-05-09 – 2014-05-12 (×8): 300 mg via ORAL
  Filled 2014-05-09 (×8): qty 1

## 2014-05-09 MED ORDER — HEPARIN SODIUM (PORCINE) 5000 UNIT/ML IJ SOLN
5000.0000 [IU] | Freq: Three times a day (TID) | INTRAMUSCULAR | Status: DC
  Administered 2014-05-09 – 2014-05-12 (×9): 5000 [IU] via SUBCUTANEOUS
  Filled 2014-05-09 (×8): qty 1

## 2014-05-09 MED ORDER — SODIUM CHLORIDE 0.9 % IV SOLN
INTRAVENOUS | Status: DC
  Administered 2014-05-09 – 2014-05-11 (×4): via INTRAVENOUS

## 2014-05-09 MED ORDER — FUROSEMIDE 20 MG PO TABS
20.0000 mg | ORAL_TABLET | Freq: Every day | ORAL | Status: DC
  Administered 2014-05-09 – 2014-05-12 (×4): 20 mg via ORAL
  Filled 2014-05-09 (×4): qty 1

## 2014-05-09 MED ORDER — SODIUM CHLORIDE 0.9 % IV BOLUS (SEPSIS)
1000.0000 mL | INTRAVENOUS | Status: AC
  Administered 2014-05-09 (×3): 1000 mL via INTRAVENOUS

## 2014-05-09 MED ORDER — INSULIN ASPART 100 UNIT/ML ~~LOC~~ SOLN
0.0000 [IU] | Freq: Every day | SUBCUTANEOUS | Status: DC
Start: 2014-05-09 — End: 2014-05-12

## 2014-05-09 MED ORDER — ASPIRIN 325 MG PO TABS
325.0000 mg | ORAL_TABLET | Freq: Every day | ORAL | Status: DC
  Administered 2014-05-09 – 2014-05-12 (×4): 325 mg via ORAL
  Filled 2014-05-09 (×4): qty 1

## 2014-05-09 MED ORDER — INSULIN GLARGINE 100 UNIT/ML SOLOSTAR PEN: 15.0000 [IU] | PEN_INJECTOR | Freq: Every day | SUBCUTANEOUS | Status: DC

## 2014-05-09 MED ORDER — INSULIN GLARGINE 100 UNIT/ML ~~LOC~~ SOLN
15.0000 [IU] | Freq: Every day | SUBCUTANEOUS | Status: DC
  Administered 2014-05-09: 15 [IU] via SUBCUTANEOUS
  Filled 2014-05-09 (×2): qty 0.15

## 2014-05-09 MED ORDER — PRAVASTATIN SODIUM 10 MG PO TABS
20.0000 mg | ORAL_TABLET | Freq: Every day | ORAL | Status: DC
  Administered 2014-05-10 – 2014-05-11 (×2): 20 mg via ORAL
  Filled 2014-05-09 (×2): qty 2

## 2014-05-09 MED ORDER — SODIUM CHLORIDE 0.9 % IJ SOLN
3.0000 mL | Freq: Two times a day (BID) | INTRAMUSCULAR | Status: DC
Start: 2014-05-09 — End: 2014-05-12
  Administered 2014-05-09 – 2014-05-11 (×5): 3 mL via INTRAVENOUS

## 2014-05-09 MED ORDER — DEXTROSE 5 % IV SOLN
2.0000 g | Freq: Once | INTRAVENOUS | Status: AC
  Administered 2014-05-09: 2 g via INTRAVENOUS
  Filled 2014-05-09: qty 2

## 2014-05-09 MED ORDER — METRONIDAZOLE IN NACL 5-0.79 MG/ML-% IV SOLN
500.0000 mg | Freq: Once | INTRAVENOUS | Status: AC
  Administered 2014-05-09: 500 mg via INTRAVENOUS
  Filled 2014-05-09: qty 100

## 2014-05-09 MED ORDER — ONDANSETRON HCL 4 MG PO TABS: 4.0000 mg | ORAL_TABLET | Freq: Four times a day (QID) | ORAL | Status: DC | PRN

## 2014-05-09 MED ORDER — VANCOMYCIN HCL IN DEXTROSE 1-5 GM/200ML-% IV SOLN
1000.0000 mg | Freq: Once | INTRAVENOUS | Status: AC
  Administered 2014-05-09: 1000 mg via INTRAVENOUS
  Filled 2014-05-09: qty 200

## 2014-05-09 MED ORDER — SODIUM CHLORIDE 0.9 % IV SOLN
INTRAVENOUS | Status: DC
  Administered 2014-05-09: 3.5 [IU]/h via INTRAVENOUS
  Filled 2014-05-09: qty 2.5

## 2014-05-09 MED ORDER — INSULIN ASPART 100 UNIT/ML ~~LOC~~ SOLN
0.0000 [IU] | Freq: Three times a day (TID) | SUBCUTANEOUS | Status: DC
  Administered 2014-05-10: 3 [IU] via SUBCUTANEOUS
  Administered 2014-05-10: 11 [IU] via SUBCUTANEOUS
  Administered 2014-05-10: 7 [IU] via SUBCUTANEOUS
  Administered 2014-05-11: 4 [IU] via SUBCUTANEOUS
  Administered 2014-05-11 – 2014-05-12 (×2): 7 [IU] via SUBCUTANEOUS
  Administered 2014-05-12: 4 [IU] via SUBCUTANEOUS

## 2014-05-09 MED ORDER — ONDANSETRON HCL 4 MG/2ML IJ SOLN: 4.0000 mg | Freq: Four times a day (QID) | INTRAMUSCULAR | Status: DC | PRN

## 2014-05-09 MED ORDER — SODIUM CHLORIDE 0.9 % IV BOLUS (SEPSIS)
500.0000 mL | INTRAVENOUS | Status: AC
  Administered 2014-05-09: 500 mL via INTRAVENOUS

## 2014-05-09 NOTE — ED Notes (Signed)
Critical glucose 615 reported.

## 2014-05-09 NOTE — H&P (Signed)
[facility] History and Physical  Mitchell ButtGary Fieldhouse ZOX:[id number] DOB: Aug 02, 1950 DOA: 05/09/2014  Referring physician: ER. PCP: Kirstie PeriSHAH,ASHISH, MD   Chief Complaint: Right foot and lower leg pain.  HPI: Mitchell [name] is a 64 y.o. male  This is a 64 year old man who complains of right foot swelling and pain. In fact, on closer questioning, he has had problems with the right lower leg for approximately one month but did not seek medical advise. His friend finally convinced him to come to the emergency room. He apparently went to a podiatrist and had some sort of procedure on the sole of the foot. He has had increased redness and pain since that time and the redness now seems to be creeping up his right leg. He denies any specific fevers but does have history of chills and feeling weak. He denies any nausea, vomiting or diarrhea. There is no abdominal pain. There is no chest pain, palpitations, or limb weakness. He is now being admitted for management of the cellulitis of his right lower leg.   Review of Systems:  Apart from symptoms above, all systems negative.  Past Medical History  Diagnosis Date  . Diabetes mellitus without complication   . Arthritis   . Hypercholesterolemia   . Obesity 05/09/2014   Past Surgical History  Procedure Laterality Date  . Appendectomy     Social History:  reports that he has been smoking Cigarettes.  He has been smoking about 1.50 packs per day. He does not have any smokeless tobacco history on file. He reports that he drinks alcohol. He reports that he does not use illicit drugs.  Allergies  Allergen Reactions  . Penicillins Itching and Rash     Family history: No family history of diabetes.   Prior to Admission medications   Medication Sig Start Date End Date Taking? Authorizing Provider  aspirin 325 MG tablet Take 325 mg by mouth daily.   Yes Historical Provider, MD  furosemide (LASIX) 20 MG tablet Take 1 tablet (20 mg total) by mouth daily. 03/22/13   Yes Standley Brookinganiel P Goodrich, MD  gabapentin (NEURONTIN) 300 MG capsule Take 1 capsule (300 mg total) by mouth 3 (three) times daily. 03/22/13  Yes Standley Brookinganiel P Goodrich, MD  ibuprofen (ADVIL,MOTRIN) 200 MG tablet Take 800-1,000 mg by mouth every 6 (six) hours as needed for mild pain.   Yes Historical Provider, MD  insulin aspart (NOVOLOG) 100 UNIT/ML FlexPen Inject 6 Units into the skin 3 (three) times daily with meals. 03/22/13  Yes Standley Brookinganiel P Goodrich, MD  Insulin Glargine (LANTUS) 100 UNIT/ML Solostar Pen Inject 15 Units into the skin daily. 03/22/13  Yes Standley Brookinganiel P Goodrich, MD  lovastatin (MEVACOR) 20 MG tablet Take 1 tablet (20 mg total) by mouth at bedtime. 03/22/13  Yes Standley Brookinganiel P Goodrich, MD  metFORMIN (GLUCOPHAGE) 500 MG tablet Take 2 tablets (1,000 mg total) by mouth 2 (two) times daily with a meal. 03/22/13  Yes Standley Brookinganiel P Goodrich, MD  doxycycline (VIBRA-TABS) 100 MG tablet Take 1 tablet (100 mg total) by mouth every 12 (twelve) hours. Patient not taking: Reported on 05/09/2014 03/22/13   Standley Brookinganiel P Goodrich, MD   Physical Exam: Filed Vitals:   05/09/14 1700 05/09/14 1730 05/09/14 1800 05/09/14 1921  BP: 128/66 115/78 113/63   Pulse: 90 73 73   Temp:    98 F (36.7 C)  TempSrc:    Axillary  Resp: 25 17 20    Height:      Weight:  SpO2: 97% 95% 96%     Wt Readings from Last 3 Encounters:  05/09/14 103.874 kg (229 lb)  03/22/13 101.833 kg (224 lb 8 oz)  03/19/12 120.203 kg (265 lb)    General:  Appears calm and comfortable. He looks systemically well and is nontoxic or septic clinically. He appears to be hemodynamically stable also. He is obese. Eyes: PERRL, normal lids, irises & conjunctiva ENT: grossly normal hearing, lips & tongue Neck: no LAD, masses or thyromegaly Cardiovascular: RRR, no m/r/g. No LE edema. Telemetry: SR, no arrhythmias  Respiratory: CTA bilaterally, no w/r/r. Normal respiratory effort. Abdomen: soft, ntnd Skin: He has significant cellulitis extending from the right foot to  the right lower leg approximately up to the right knee. Also there is ulceration/wound in the sole of the right foot which is quite large. Musculoskeletal: grossly normal tone BUE/BLE Psychiatric: grossly normal mood and affect, speech fluent and appropriate Neurologic: grossly non-focal.          Labs on Admission:  Basic Metabolic Panel:  Recent Labs Lab 05/09/14 1536  NA 130*  K 4.0  CL 93*  CO2 27  GLUCOSE 615*  BUN 13  CREATININE 0.89  CALCIUM 8.6   Liver Function Tests:  Recent Labs Lab 05/09/14 1536  AST 16  ALT 19  ALKPHOS 136*  BILITOT 0.6  PROT 6.8  ALBUMIN 3.5   No results for input(s): LIPASE, AMYLASE in the last 168 hours. No results for input(s): AMMONIA in the last 168 hours. CBC:  Recent Labs Lab 05/09/14 1536  WBC 7.8  NEUTROABS 5.5  HGB 15.9  HCT 46.7  MCV 88.4  PLT 182   Cardiac Enzymes: No results for input(s): CKTOTAL, CKMB, CKMBINDEX, TROPONINI in the last 168 hours.  BNP (last 3 results) No results for input(s): BNP in the last 8760 hours.  ProBNP (last 3 results) No results for input(s): PROBNP in the last 8760 hours.  CBG:  Recent Labs Lab 05/09/14 1724 05/09/14 1843  GLUCAP 410* 340*    Radiological Exams on Admission: Dg Chest Port 1 View  05/09/2014   CLINICAL DATA:  Cough for 6 months.  EXAM: PORTABLE CHEST - 1 VIEW  COMPARISON:  03/21/2013  FINDINGS: The heart size and mediastinal contours are within normal limits. Both lungs are clear. The visualized skeletal structures are unremarkable.  IMPRESSION: No active disease.   Electronically Signed   By: Elige Ko   On: 05/09/2014 17:06   Dg Foot 2 Views Right  05/09/2014   CLINICAL DATA:  Diabetes.  Foot pain.  EXAM: RIGHT FOOT - 2 VIEW  COMPARISON:  None.  FINDINGS: There is no evidence of fracture or dislocation. There are enthesopathic changes of the Achilles tendon insertion. There is a small plantar calcaneal spur. There is no bone destruction or periosteal  reaction. Soft tissues are unremarkable.  IMPRESSION: No acute osseous injury of the right foot.   Electronically Signed   By: Elige Ko   On: 05/09/2014 17:08    Assessment/Plan   1. Right lower leg cellulitis. This is quite severe together with ulceration of the right sole of the foot. We will put him on broad-spectrum antibiotics. We will request wound care consultation. 2. Diabetes . This is uncontrolled with initial glucose of 615 but he is not in ketoacidosis nor any significant hyperosmolar state. His subsequent glucose was 340. We will continue on his home insulin and a sliding scale. I feel comfortable that he does not need to be  on intravenous insulin as his glucose has come down very quickly. 3. Obesity.  Further recommendations will depend on patient's hospital progress.   Code Status: Full code.   DVT Prophylaxis: Heparin.  Family Communication: I discussed the plan with the patient at the bedside.   Disposition Plan: Home when medically stable.   Time spent: 60 minutes.  Wilson Singer [facility] Pager [phone].

## 2014-05-09 NOTE — ED Provider Notes (Signed)
CSN: [id number]     Arrival date & time 05/09/14  1452 History   First MD Initiated Contact with Patient 05/09/14 1512     Chief Complaint  Patient presents with  . Foot Pain     (Consider location/radiation/quality/duration/timing/severity/associated sxs/prior Treatment) HPI 64 year old male insulin-dependent diabetic presents today complaining of right foot swelling and pain. He has had increased redness over the past month. He reports going to a podiatrist and having some type of procedure on the sole of the foot. He has had increased redness and pain since that time. The redness has now crept up his leg almost to the knee. It is increasingly painful and warm. He has had some chills and weakness but has not had any defined fever. Has had some decreased by mouth intake today but not vomiting or diarrhea. He has cough at baseline and is a smoker and continues to have some increasing cough. He does not feel more dyspneic than baseline. He denies headache, neck pain, chest pain, abdominal pain, change in urinary output, or rectal bleeding. Past Medical History  Diagnosis Date  . Diabetes mellitus without complication   . Arthritis   . Hypercholesterolemia    Past Surgical History  Procedure Laterality Date  . Appendectomy     History reviewed. No pertinent family history. History  Substance Use Topics  . Smoking status: Current Every Day Smoker -- 1.50 packs/day    Types: Cigarettes  . Smokeless tobacco: Not on file  . Alcohol Use: Yes     Comment: occ-twice a week beer approx 2    Review of Systems  All other systems reviewed and are negative.     Allergies  Penicillins  Home Medications   Prior to Admission medications   Medication Sig Start Date End Date Taking? Authorizing Provider  aspirin 325 MG tablet Take 325 mg by mouth daily.    Historical Provider, MD  doxycycline (VIBRA-TABS) 100 MG tablet Take 1 tablet (100 mg total) by mouth every 12 (twelve) hours. 03/22/13    Standley Brooking, MD  furosemide (LASIX) 20 MG tablet Take 1 tablet (20 mg total) by mouth daily. 03/22/13   Standley Brooking, MD  gabapentin (NEURONTIN) 300 MG capsule Take 1 capsule (300 mg total) by mouth 3 (three) times daily. 03/22/13   Standley Brooking, MD  insulin aspart (NOVOLOG) 100 UNIT/ML FlexPen Inject 6 Units into the skin 3 (three) times daily with meals. 03/22/13   Standley Brooking, MD  Insulin Glargine (LANTUS) 100 UNIT/ML Solostar Pen Inject 15 Units into the skin daily. 03/22/13   Standley Brooking, MD  lovastatin (MEVACOR) 20 MG tablet Take 1 tablet (20 mg total) by mouth at bedtime. 03/22/13   Standley Brooking, MD  metFORMIN (GLUCOPHAGE) 500 MG tablet Take 2 tablets (1,000 mg total) by mouth 2 (two) times daily with a meal. 03/22/13   Standley Brooking, MD   BP 129/68 mmHg  Pulse 91  Temp(Src) 97.6 F (36.4 C) (Oral)  Resp 15  Ht  (1.803 m)  Wt 229 lb (103.874 kg)  BMI 31.95 kg/m2  SpO2 99% Physical Exam  Constitutional: He is oriented to person, place, and time. He appears well-developed and well-nourished.  HENT:  Head: Normocephalic and atraumatic.  Right Ear: External ear normal.  Left Ear: External ear normal.  Nose: Nose normal.  Mouth/Throat: Oropharynx is clear and moist.  Eyes: Conjunctivae and EOM are normal. Pupils are equal, round, and reactive to light.  Neck:  Normal range of motion. Neck supple.  Cardiovascular: Normal rate, regular rhythm, normal heart sounds and intact distal pulses.   Pulmonary/Chest: Effort normal.  Some decreased breath sounds bilateral bases no rhonchi, rales, or wheezing  Abdominal: Soft. Bowel sounds are normal. He exhibits no distension. There is no tenderness.  Musculoskeletal: He exhibits edema and tenderness.       Legs: Redness, induration, and tenderness right lower extremity entire foot up two thirds anterior tibia Dorsal pedalis pulses are intact. Dorsal aspect of right foot has an ulcerative area at the base of the  fifth toe  Neurological: He is alert and oriented to person, place, and time. He has normal reflexes. No cranial nerve deficit. Coordination normal.  Skin: Skin is dry. Rash noted.  Psychiatric: He has a normal mood and affect. His behavior is normal. Judgment and thought content normal.  Nursing note and vitals reviewed.   ED Course  Procedures (including critical care time) Labs Review Labs Reviewed  COMPREHENSIVE METABOLIC PANEL - Abnormal; Notable for the following:    Sodium 130 (*)    Chloride 93 (*)    Glucose, Bld 615 (*)    Alkaline Phosphatase 136 (*)    GFR calc non Af Amer 88 (*)    All other components within normal limits  I-STAT CG4 LACTIC ACID, ED - Abnormal; Notable for the following:    Lactic Acid, Venous 2.99 (*)    All other components within normal limits  CULTURE, BLOOD (ROUTINE X 2)  CULTURE, BLOOD (ROUTINE X 2)  URINE CULTURE  CBC WITH DIFFERENTIAL/PLATELET  URINALYSIS, ROUTINE W REFLEX MICROSCOPIC  CBG MONITORING, ED    Imaging Review No results found.   EKG Interpretation None      MDM   Final diagnoses:  Cellulitis of right lower extremity  Hyperglycemia      Initial lactic acid elevated.  Fluid bolus ensuing.  3:47 PM ED Sepsis - Repeat Assessment   Performed at:    May 09, 2014, 3:47 PM   Last Vitals:    Blood pressure 129/68, pulse 91, temperature 97.6 F (36.4 C), temperature source Oral, resp. rate 15, height  (1.803 m), weight 229 lb (103.874 kg), SpO2 99 %.  Heart:      rrr  Lungs:     Cta, decreased bs bilateral bases  Capillary Refill:   <2 sec  Peripheral Pulse (include location): Bilateral radial pulses 2+, bilateral dp 2+   Skin (include color):   pink   1- foot/leg cellulitis/sepsis-  Patient received iv antibiotics, fluid bolus, lactic acid per sepsis pathway.  Patient remains hemodynamically stable. Foot x-Cina Klumpp pending to assess for osteo.  Repeat lactic acid at 3 hours ordered.  Patient remains  hemodynamically stable. Filed Vitals:   05/09/14 1630  BP: 137/72  Pulse: 91  Temp:   Resp: 24   2- hyperglycemia- patine twith initial bs of 6:15  and iron gap is 10 CO2 is normal. Fluid bolus is ensuing as above. Glucose stabilizer is ordered.   05/09/2014  CRITICAL CARE Performed by: Hilario Quarry Total critical care time: 75 Critical care time was exclusive of separately billable procedures and treating other patients. Critical care was necessary to treat or prevent imminent or life-threatening deterioration. Critical care was time spent personally by me on the following activities: development of treatment plan with patient and/or surrogate as well as nursing, discussions with consultants, evaluation of patient's response to treatment, examination of patient, obtaining history from patient or surrogate, ordering  and performing treatments and interventions, ordering and review of laboratory studies, ordering and review of radiographic studies, pulse oximetry and re-evaluation of patient's condition.   Discussed with Dr. Karilyn CotaGosrani and plan admission to step down bed.  Margarita Grizzleanielle Keyden Pavlov, MD 05/09/14 [phone]

## 2014-05-09 NOTE — Progress Notes (Signed)
ANTIBIOTIC CONSULT NOTE - INITIAL  Pharmacy Consult for Vancomycin, Flagyl, Aztreonam Indication: cellulitis  Allergies  Allergen Reactions  . Penicillins Itching and Rash   Patient Measurements: Height: 5\' 11"  (180.3 cm) Weight: 229 lb (103.874 kg) IBW/kg (Calculated) : 75.3  Vital Signs: Temp: 97.6 F (36.4 C) (04/24 1454) Temp Source: Oral (04/24 1454) BP: 128/66 mmHg (04/24 1700) Pulse Rate: 90 (04/24 1700) Intake/Output from previous day:   Intake/Output from this shift:    Labs:  Recent Labs  05/09/14 1536  WBC 7.8  HGB 15.9  PLT 182  CREATININE 0.89   Estimated Creatinine Clearance: 102.8 mL/min (by C-G formula based on Cr of 0.89). No results for input(s): VANCOTROUGH, VANCOPEAK, VANCORANDOM, GENTTROUGH, GENTPEAK, GENTRANDOM, TOBRATROUGH, TOBRAPEAK, TOBRARND, AMIKACINPEAK, AMIKACINTROU, AMIKACIN in the last 72 hours.   Microbiology: No results found for this or any previous visit (from the past 720 hour(s)).  Medical History: Past Medical History  Diagnosis Date  . Diabetes mellitus without complication   . Arthritis   . Hypercholesterolemia    Anti-infectives    Start     Dose/Rate Route Frequency Ordered Stop   05/09/14 1530  aztreonam (AZACTAM) 2 g in dextrose 5 % 50 mL IVPB     2 g 100 mL/hr over 30 Minutes Intravenous  Once 05/09/14 1526 05/09/14 1632   05/09/14 1530  metroNIDAZOLE (FLAGYL) IVPB 500 mg     500 mg 100 mL/hr over 60 Minutes Intravenous  Once 05/09/14 1526 05/09/14 1648   05/09/14 1530  vancomycin (VANCOCIN) IVPB 1000 mg/200 mL premix     1,000 mg 200 mL/hr over 60 Minutes Intravenous  Once 05/09/14 1526 05/09/14 1727      Assessment: Mitchell [name] admitted with cellulitis of right foot.  C/O increased swelling and pain.  Initial ABX given in ED on admission.  Afebrile.  WBC normal.  Goal of Therapy:  Vancomycin trough level 10-15 mcg/ml  Plan:  Vancomycin 1250mg  IV q12hrs Check trough at steady state Flagyl 500mg  IV  q8h Aztreonam 1gm IV q8h Deescalate / Narrow ABX when improved Monitor labs, progress, renal fxn, and cultures  Valrie HartHall, Nicolas Banh A 05/09/2014,5:43 PM

## 2014-05-09 NOTE — ED Notes (Signed)
Pt states a sore started on right lateral side of right foot next to pinky toe one month ago. Pt now states the bottom of his foot looks like it has been "filleted". Pt alert/oriented. Pt c/o swelling up to ankle. Pt is noncompliant with pcp visits. Pt is DM.

## 2014-05-10 ENCOUNTER — Encounter (HOSPITAL_COMMUNITY): Payer: Self-pay | Admitting: *Deleted

## 2014-05-10 DIAGNOSIS — G629 Polyneuropathy, unspecified: Secondary | ICD-10-CM

## 2014-05-10 DIAGNOSIS — E785 Hyperlipidemia, unspecified: Secondary | ICD-10-CM

## 2014-05-10 LAB — COMPREHENSIVE METABOLIC PANEL
ALT: 15 U/L (ref 0–53)
ANION GAP: 8 (ref 5–15)
AST: 14 U/L (ref 0–37)
Albumin: 2.9 g/dL — ABNORMAL LOW (ref 3.5–5.2)
Alkaline Phosphatase: 98 U/L (ref 39–117)
BUN: 9 mg/dL (ref 6–23)
CO2: 25 mmol/L (ref 19–32)
CREATININE: 0.7 mg/dL (ref 0.50–1.35)
Calcium: 7.9 mg/dL — ABNORMAL LOW (ref 8.4–10.5)
Chloride: 104 mmol/L (ref 96–112)
GFR calc non Af Amer: >90 mL/min (ref >90)
GLUCOSE: 255 mg/dL — AB (ref 70–99)
POTASSIUM: 3.8 mmol/L (ref 3.5–5.1)
Sodium: 137 mmol/L (ref 135–145)
Total Bilirubin: 0.6 mg/dL (ref 0.3–1.2)
Total Protein: 5.7 g/dL — ABNORMAL LOW (ref 6.0–8.3)

## 2014-05-10 LAB — GLUCOSE, CAPILLARY
GLUCOSE-CAPILLARY: 131 mg/dL — AB (ref 70–99)
Glucose-Capillary: 246 mg/dL — ABNORMAL HIGH (ref 70–99)
Glucose-Capillary: 274 mg/dL — ABNORMAL HIGH (ref 70–99)
Glucose-Capillary: 193 mg/dL — ABNORMAL HIGH (ref 70–99)

## 2014-05-10 LAB — CBC
HCT: 42 % (ref 39.0–52.0)
Hemoglobin: 13.8 g/dL (ref 13.0–17.0)
MCH: 28.9 pg (ref 26.0–34.0)
MCHC: 32.9 g/dL (ref 30.0–36.0)
MCV: 87.9 fL (ref 78.0–100.0)
Platelets: 166 10*3/uL (ref 150–400)
RBC: 4.78 MIL/uL (ref 4.22–5.81)
RDW: 13.7 % (ref 11.5–15.5)
WBC: 6.1 10*3/uL (ref 4.0–10.5)

## 2014-05-10 LAB — C-REACTIVE PROTEIN: CRP: 4.6 mg/dL — AB (ref <0.60)

## 2014-05-10 LAB — SEDIMENTATION RATE: SED RATE: 33 mm/h — AB (ref 0–16)

## 2014-05-10 MED ORDER — SODIUM CHLORIDE 0.9 % IV SOLN
500.0000 mg | Freq: Four times a day (QID) | INTRAVENOUS | Status: DC
  Administered 2014-05-10 – 2014-05-12 (×9): 500 mg via INTRAVENOUS
  Filled 2014-05-10 (×12): qty 500

## 2014-05-10 MED ORDER — METRONIDAZOLE IN NACL 5-0.79 MG/ML-% IV SOLN
500.0000 mg | Freq: Three times a day (TID) | INTRAVENOUS | Status: DC
  Administered 2014-05-10 (×2): 500 mg via INTRAVENOUS
  Filled 2014-05-10 (×2): qty 100

## 2014-05-10 MED ORDER — DEXTROSE 5 % IV SOLN
INTRAVENOUS | Status: AC
  Filled 2014-05-10: qty 1

## 2014-05-10 MED ORDER — VANCOMYCIN HCL 10 G IV SOLR
1250.0000 mg | Freq: Two times a day (BID) | INTRAVENOUS | Status: DC
  Administered 2014-05-10 – 2014-05-12 (×5): 1250 mg via INTRAVENOUS
  Filled 2014-05-10 (×7): qty 1250

## 2014-05-10 MED ORDER — MUPIROCIN CALCIUM 2 % EX CREA
TOPICAL_CREAM | Freq: Every day | CUTANEOUS | Status: DC
  Administered 2014-05-10 – 2014-05-12 (×3): via TOPICAL
  Filled 2014-05-10: qty 15

## 2014-05-10 MED ORDER — DEXTROSE 5 % IV SOLN
1.0000 g | Freq: Three times a day (TID) | INTRAVENOUS | Status: DC
  Administered 2014-05-10 (×2): 1 g via INTRAVENOUS
  Filled 2014-05-10 (×5): qty 1

## 2014-05-10 MED ORDER — INSULIN GLARGINE 100 UNIT/ML ~~LOC~~ SOLN
20.0000 [IU] | Freq: Every day | SUBCUTANEOUS | Status: DC
  Administered 2014-05-10: 20 [IU] via SUBCUTANEOUS
  Filled 2014-05-10: qty 0.2

## 2014-05-10 MED ORDER — NICOTINE 21 MG/24HR TD PT24
21.0000 mg | MEDICATED_PATCH | Freq: Every day | TRANSDERMAL | Status: DC
  Administered 2014-05-10 – 2014-05-12 (×3): 21 mg via TRANSDERMAL
  Filled 2014-05-10 (×3): qty 1

## 2014-05-10 MED ORDER — INSULIN ASPART 100 UNIT/ML ~~LOC~~ SOLN
3.0000 [IU] | Freq: Three times a day (TID) | SUBCUTANEOUS | Status: DC
  Administered 2014-05-10 (×2): 3 [IU] via SUBCUTANEOUS

## 2014-05-10 MED ORDER — PNEUMOCOCCAL VAC POLYVALENT 25 MCG/0.5ML IJ INJ
0.5000 mL | INJECTION | INTRAMUSCULAR | Status: AC
  Administered 2014-05-11: 0.5 mL via INTRAMUSCULAR
  Filled 2014-05-10: qty 0.5

## 2014-05-10 NOTE — Consult Note (Addendum)
WOC wound consult note Reason for Consult: Consult requested for right foot wound.  Pt stated the wound declined after a podiatrist in [place] debrided the wound last week, and then it began draining and he also developed cellulitis to the right leg. Consult performed via remote camera with bedside nurse assistance for assessment and measurements. X-ray did not indicate osteomyelitis. Wound type: Right plantar foot with full thickness wound, 15X13X.1cm, dark red wound bed with slight odor, small amt pink drainage. Cellulitis extends from right foot to right upper calf with generalized edema and erythremia, no open wounds or drainage to leg. Dressing procedure/placement/frequency: Pt is on IV antibiotics for cellulitis.  Bactroban to right foot to provide antimicrobial benefits and promote healing.  Discussed plan of care with patient and he verbalizes understanding.  He does not want to return to the podiatrist in [place] and would appreciate a referral to the podiatrist in [place]. Please re-consult if further assistance is needed.  Thank-you,  Cammie Mcgeeawn Toma Erichsen MSN, RN, CWOCN, [place], CNS [phone]

## 2014-05-10 NOTE — Progress Notes (Addendum)
Inpatient Diabetes Program Recommendations  AACE/ADA: New Consensus Statement on Inpatient Glycemic Control (2013)  Target Ranges:  Prepandial:   less than 140 mg/dL      Peak postprandial:   less than 180 mg/dL (1-2 hours)      Critically ill patients:  140 - 180 mg/dL   Results for Mitchell [name], Mitchell [name] (MRN [id number]) as of 05/10/2014 07:41  Ref. Range 05/09/2014 17:24 05/09/2014 18:43 05/09/2014 20:27  Glucose-Capillary Latest Ref Range: 70-99 mg/dL 829410 (H) 562340 (H) 130227 (H)   Diabetes history: DM2 Outpatient Diabetes medications: Lantus 15 units daily, Novolog 6 units TID with meals, Metformin 1000 mg BID Current orders for Inpatient glycemic control: Lantus 15 units daily, Novolog 0-20 units TID with meals, Novolog 0-5 units HS  Inpatient Diabetes Program Recommendations Insulin - Basal: Please consider increasing Lantus to 20 units (based on 103.8 kg x 0.2 units) and change to QHS (instead of daily) since patient received Lantus 15 units at 21:55 last night. HgbA1C: Please consider ordering an A1C to evaluate glycemic control over the past 2-3 hours. Insulin-Meal Coverage: Please consider ordering Novolog 3 units TID with meals for meal coverage.  Thanks, Orlando PennerMarie Fontaine Kossman, RN, MSN, CCRN, CDE Diabetes Coordinator Inpatient Diabetes Program [phone] (Team Pager from 8am to 5pm) [phone] ([facility] office) [phone] [facility] office)

## 2014-05-10 NOTE — Progress Notes (Signed)
ANTIBIOTIC CONSULT NOTE - follow up  Pharmacy Consult for Vancomycin and Primaxin Indication: cellulitis  Allergies  Allergen Reactions  . Penicillins Itching and Rash   Patient Measurements: Height: 5\' 11"  (180.3 cm) Weight: 229 lb (103.874 kg) IBW/kg (Calculated) : 75.3  Vital Signs: Temp: 97.7 F (36.5 C) (04/25 1200) Temp Source: Axillary (04/25 1200) BP: 118/65 mmHg (04/25 1200) Pulse Rate: 76 (04/25 1200) Intake/Output from previous day: 04/24 0701 - 04/25 0700 In: 6435.1 [P.O.:960; I.V.:1225.1; IV Piggyback:4250] Out: 1600 [Urine:1600] Intake/Output from this shift: Total I/O In: 1723.3 [P.O.:840; I.V.:733.3; IV Piggyback:150] Out: 1425 [Urine:1425]  Labs:  Recent Labs  05/09/14 1536 05/10/14 0447  WBC 7.8 6.1  HGB 15.9 13.8  PLT 182 166  CREATININE 0.89 0.70   Estimated Creatinine Clearance: 114.4 mL/min (by C-G formula based on Cr of 0.7). No results for input(s): VANCOTROUGH, VANCOPEAK, VANCORANDOM, GENTTROUGH, GENTPEAK, GENTRANDOM, TOBRATROUGH, TOBRAPEAK, TOBRARND, AMIKACINPEAK, AMIKACINTROU, AMIKACIN in the last 72 hours.   Microbiology: Recent Results (from the past 720 hour(s))  Blood Culture (routine x 2)     Status: None (Preliminary result)   Collection Time: 05/09/14  3:36 PM  Result Value Ref Range Status   Specimen Description LEFT ANTECUBITAL  Final   Special Requests BOTTLES DRAWN AEROBIC AND ANAEROBIC 6CC  Final   Culture NO GROWTH 1 DAY  Final   Report Status PENDING  Incomplete  Blood Culture (routine x 2)     Status: None (Preliminary result)   Collection Time: 05/09/14  3:36 PM  Result Value Ref Range Status   Specimen Description BLOOD RIGHT HAND  Final   Special Requests BOTTLES DRAWN AEROBIC ONLY 6CC  Final   Culture NO GROWTH 1 DAY  Final   Report Status PENDING  Incomplete  Urine culture     Status: None (Preliminary result)   Collection Time: 05/09/14  4:50 PM  Result Value Ref Range Status   Specimen Description URINE,  CLEAN CATCH  Final   Special Requests NONE  Final   Culture PENDING  Incomplete   Report Status PENDING  Incomplete  MRSA PCR Screening     Status: None   Collection Time: 05/09/14  7:26 PM  Result Value Ref Range Status   MRSA by PCR NEGATIVE NEGATIVE Final    Comment:        The GeneXpert MRSA Assay (FDA approved for NASAL specimens only), is one component of a comprehensive MRSA colonization surveillance program. It is not intended to diagnose MRSA infection nor to guide or monitor treatment for MRSA infections.    Medical History: Past Medical History  Diagnosis Date  . Diabetes mellitus without complication   . Arthritis   . Hypercholesterolemia   . Obesity 05/09/2014   Anti-infectives    Start     Dose/Rate Route Frequency Ordered Stop   05/10/14 1500  imipenem-cilastatin (PRIMAXIN) 500 mg in sodium chloride 0.9 % 100 mL IVPB     500 mg 200 mL/hr over 30 Minutes Intravenous Every 6 hours 05/10/14 1246     05/10/14 0500  metroNIDAZOLE (FLAGYL) IVPB 500 mg  Status:  Discontinued     500 mg 100 mL/hr over 60 Minutes Intravenous Every 8 hours 05/10/14 0411 05/10/14 1235   05/10/14 0430  aztreonam (AZACTAM) 1 g in dextrose 5 % 50 mL IVPB  Status:  Discontinued     1 g 100 mL/hr over 30 Minutes Intravenous Every 8 hours 05/10/14 0411 05/10/14 1235   05/10/14 0415  vancomycin (VANCOCIN) 1,250 mg  in sodium chloride 0.9 % 250 mL IVPB     1,250 mg 166.7 mL/hr over 90 Minutes Intravenous Every 12 hours 05/10/14 0411     05/09/14 1530  aztreonam (AZACTAM) 2 g in dextrose 5 % 50 mL IVPB     2 g 100 mL/hr over 30 Minutes Intravenous  Once 05/09/14 1526 05/09/14 1632   05/09/14 1530  metroNIDAZOLE (FLAGYL) IVPB 500 mg     500 mg 100 mL/hr over 60 Minutes Intravenous  Once 05/09/14 1526 05/09/14 1648   05/09/14 1530  vancomycin (VANCOCIN) IVPB 1000 mg/200 mL premix     1,000 mg 200 mL/hr over 60 Minutes Intravenous  Once 05/09/14 1526 05/09/14 1727     Assessment: 64yo male  admitted with cellulitis of right foot.  C/O increased swelling and pain.  Initial ABX given in ED on admission.  Afebrile.  WBC normal. Flagyl and Aztreonam d/c'd today in favor of Vancomycin and Primaxin  Goal of Therapy:  Vancomycin trough level 10-15 mcg/ml  Plan:  Vancomycin  IV q12hrs Check trough at steady state Primaxin  IV q6hrs Deescalate / Narrow ABX when improved Monitor labs, progress, renal fxn, and cultures  Valrie Hart A 05/10/2014,1:25 PM

## 2014-05-10 NOTE — Progress Notes (Addendum)
[facility]                                                                              Patient Demographics  Mitchell [name], is a 64 y.o. male, DOB - Oct 06, 1950, ZTI:[id number]  Admit date - 05/09/2014   Admitting Physician Doree Albee, MD  Outpatient Primary MD for the patient is [facility], MD  LOS - 1   Chief Complaint  Patient presents with  . Foot Pain      HPI on 05/09/2014 by Dr. Hurshel Party This is a 64 year old man who complains of right foot swelling and pain. In fact, on closer questioning, he has had problems with the right lower leg for approximately one month but did not seek medical advise. His friend finally convinced him to come to the emergency room. He apparently went to a podiatrist and had some sort of procedure on the sole of the foot. He has had increased redness and pain since that time and the redness now seems to be creeping up his right leg. He denies any specific fevers but does have history of chills and feeling weak. He denies any nausea, vomiting or diarrhea. There is no abdominal pain. There is no chest pain, palpitations, or limb weakness. He is now being admitted for management of the cellulitis of his right lower leg.  Assessment & Plan   Right Lower Extremity Cellulitis -Has several uclerations -Right foot xra: no acute osseous injury -Wound care consulted -very weak pulses palpated on RLE -Will obtain dopplers -May benefit from surgery or vascular surgery consult, pending wound care -Continue vancomycin and aztreonam (PCN allergy), flagyl -Will obtain ESR and CRP level  Brief hypotension -Resolved with IVF -Continue to monitor closely  Hyperglycemia/uncontrolled Diabetes Mellitus, Type 2 -Glucose >600 upon admission, required brief insulin drip. -Glucose improved, currently 255, anion gap 8 -HbA1c pending -Diabetes coordinator consulted -Will increase Lantus to 20units QHS, Add Novolog 3u per meal, and continue ISS with CBG  monitoring -metformin held   Hyperlipidemia -Continue statin  Peripheral neuropathy -Continue gabapentin  Hyponatremia -likely related to hyperglycemia -Sodium at admission 130, corrected was 135 -Currently 137 -Continue to monitor BMP  Code Status: Full  Family Communication: None at bedside  Disposition Plan: Admitted. Continue IV antibiotics and wound care consult  Time Spent in minutes   30 minutes  Procedures  None  Consults   None  DVT Prophylaxis  Heparin  Lab Results  Component Value Date   PLT 166 05/10/2014    Medications  Scheduled Meds: . aspirin  325 mg Oral Daily  . aztreonam  1 g Intravenous Q8H  . furosemide  20 mg Oral Daily  . gabapentin  300 mg Oral TID  . heparin  5,000 Units Subcutaneous 3 times per day  . insulin aspart  0-20 Units Subcutaneous TID WC  . insulin aspart  0-5 Units Subcutaneous QHS  . insulin aspart  3 Units Subcutaneous TID WC  . insulin glargine  20 Units Subcutaneous QHS  . metronidazole  500 mg Intravenous Q8H  . pravastatin  20 mg Oral q1800  . sodium chloride  3 mL Intravenous Q12H  . vancomycin  1,250 mg Intravenous Q12H  Continuous Infusions: . sodium chloride 125 mL/hr at 05/10/14 0456   PRN Meds:.HYDROcodone-acetaminophen, ondansetron **OR** ondansetron (ZOFRAN) IV  Antibiotics    Anti-infectives    Start     Dose/Rate Route Frequency Ordered Stop   05/10/14 0500  metroNIDAZOLE (FLAGYL) IVPB 500 mg     500 mg 100 mL/hr over 60 Minutes Intravenous Every 8 hours 05/10/14 0411     05/10/14 0430  aztreonam (AZACTAM) 1 g in dextrose 5 % 50 mL IVPB     1 g 100 mL/hr over 30 Minutes Intravenous Every 8 hours 05/10/14 0411     05/10/14 0415  vancomycin (VANCOCIN) 1,250 mg in sodium chloride 0.9 % 250 mL IVPB     1,250 mg 166.7 mL/hr over 90 Minutes Intravenous Every 12 hours 05/10/14 0411     05/09/14 1530  aztreonam (AZACTAM) 2 g in dextrose 5 % 50 mL IVPB     2 g 100 mL/hr over 30 Minutes Intravenous   Once 05/09/14 1526 05/09/14 1632   05/09/14 1530  metroNIDAZOLE (FLAGYL) IVPB 500 mg     500 mg 100 mL/hr over 60 Minutes Intravenous  Once 05/09/14 1526 05/09/14 1648   05/09/14 1530  vancomycin (VANCOCIN) IVPB 1000 mg/200 mL premix     1,000 mg 200 mL/hr over 60 Minutes Intravenous  Once 05/09/14 1526 05/09/14 1727        Subjective:   Willeen Niece seen and examined today.  Patient complains about his previous foot doctor and how he had to pay so much money and still has problems.  He states that it has been ongoing for months, but worsening.  Patient states he has not been able to walk.  Denies chest pain, shortness of breath, abdominal pain, N/V/C/D.    Objective:   Filed Vitals:   05/10/14 0416 05/10/14 0500 05/10/14 0600 05/10/14 0800  BP: 107/63 119/64 130/67   Pulse: 61 72 66   Temp:    97.5 F (36.4 C)  TempSrc:    Oral  Resp: _0 Height:      Weight:      SpO2: 95% 95% 97%     Wt Readings from Last 3 Encounters:  05/09/14 103.874 kg (229 lb)  03/22/13 101.833 kg (224 lb 8 oz)  03/19/12 120.203 kg (265 lb)     Intake/Output Summary (Last 24 hours) at 05/10/14 0811 Last data filed at 05/10/14 0608  Gross per 24 hour  Intake 6435.12 ml  Output   1600 ml  Net 4835.12 ml    Exam  General: Well developed, well nourished, NAD, appears stated age  HEENT: NCAT, mucous membranes moist.   Cardiovascular: S1 S2 auscultated, no rubs, murmurs or gallops. Regular rate and rhythm.  Respiratory: Clear to auscultation bilaterally with equal chest rise  Abdomen: Soft, nontender, nondistended, + bowel sounds  Extremities: warm dry without cyanosis clubbing.  RLE erythema and edema extending from the foot to the mid calf, ulcerations noted on right foot-several areas  Neuro: AAOx3, cranial nerves grossly intact. Strength 5/5 in patient's upper and lower extremities bilaterally  Skin: as above  Psych: Normal affect and demeanor with intact judgement and  insight  Data Review   Micro Results Recent Results (from the past 240 hour(s))  MRSA PCR Screening     Status: None   Collection Time: 05/09/14  7:26 PM  Result Value Ref Range Status   MRSA by PCR NEGATIVE NEGATIVE Final    Comment:  The GeneXpert MRSA Assay (FDA approved for NASAL specimens only), is one component of a comprehensive MRSA colonization surveillance program. It is not intended to diagnose MRSA infection nor to guide or monitor treatment for MRSA infections.     Radiology Reports Dg Chest Port 1 View  05/09/2014   CLINICAL DATA:  Cough for 6 months.  EXAM: PORTABLE CHEST - 1 VIEW  COMPARISON:  03/21/2013  FINDINGS: The heart size and mediastinal contours are within normal limits. Both lungs are clear. The visualized skeletal structures are unremarkable.  IMPRESSION: No active disease.   Electronically Signed   By: Kathreen Devoid   On: 05/09/2014 17:06   Dg Foot 2 Views Right  05/09/2014   CLINICAL DATA:  Diabetes.  Foot pain.  EXAM: RIGHT FOOT - 2 VIEW  COMPARISON:  None.  FINDINGS: There is no evidence of fracture or dislocation. There are enthesopathic changes of the Achilles tendon insertion. There is a small plantar calcaneal spur. There is no bone destruction or periosteal reaction. Soft tissues are unremarkable.  IMPRESSION: No acute osseous injury of the right foot.   Electronically Signed   By: Kathreen Devoid   On: 05/09/2014 17:08    CBC  Recent Labs Lab 05/09/14 1536 05/10/14 0447  WBC 7.8 6.1  HGB 15.9 13.8  HCT 46.7 42.0  PLT 182 166  MCV 88.4 87.9  MCH 30.1 28.9  MCHC 34.0 32.9  RDW 13.5 13.7  LYMPHSABS 1.5  --   MONOABS 0.6  --   EOSABS 0.2  --   BASOSABS 0.0  --     Chemistries   Recent Labs Lab 05/09/14 1536 05/10/14 0447  NA 130* 137  K 4.0 3.8  CL 93* 104  CO2 27 25  GLUCOSE 615* 255*  BUN 13 9  CREATININE 0.89 0.70  CALCIUM 8.6 7.9*  AST 16 14  ALT 19 15  ALKPHOS 136* 98  BILITOT 0.6 0.6    ------------------------------------------------------------------------------------------------------------------ estimated creatinine clearance is 114.4 mL/min (by C-G formula based on Cr of 0.7). ------------------------------------------------------------------------------------------------------------------ No results for input(s): HGBA1C in the last 72 hours. ------------------------------------------------------------------------------------------------------------------ No results for input(s): CHOL, HDL, LDLCALC, TRIG, CHOLHDL, LDLDIRECT in the last 72 hours. ------------------------------------------------------------------------------------------------------------------ No results for input(s): TSH, T4TOTAL, T3FREE, THYROIDAB in the last 72 hours.  Invalid input(s): FREET3 ------------------------------------------------------------------------------------------------------------------ No results for input(s): VITAMINB12, FOLATE, FERRITIN, TIBC, IRON, RETICCTPCT in the last 72 hours.  Coagulation profile No results for input(s): INR, PROTIME in the last 168 hours.  No results for input(s): DDIMER in the last 72 hours.  Cardiac Enzymes No results for input(s): CKMB, TROPONINI, MYOGLOBIN in the last 168 hours.  Invalid input(s): CK ------------------------------------------------------------------------------------------------------------------ Invalid input(s): POCBNP    Marilee Ditommaso D.O. on 05/10/2014 at 8:11 AM  Between 7am to 7pm - Pager - [phone]  After 7pm go to [email or web address] - password [email or web address]  And look for the night coverage person covering for me after hours  [facility] Office  [phone]

## 2014-05-10 NOTE — Care Management Utilization Note (Signed)
UR completed 

## 2014-05-11 ENCOUNTER — Inpatient Hospital Stay (HOSPITAL_COMMUNITY): Payer: Medicare Other

## 2014-05-11 DIAGNOSIS — L03115 Cellulitis of right lower limb: Secondary | ICD-10-CM | POA: Diagnosis not present

## 2014-05-11 DIAGNOSIS — L03119 Cellulitis of unspecified part of limb: Secondary | ICD-10-CM

## 2014-05-11 DIAGNOSIS — L02419 Cutaneous abscess of limb, unspecified: Secondary | ICD-10-CM

## 2014-05-11 DIAGNOSIS — G629 Polyneuropathy, unspecified: Secondary | ICD-10-CM | POA: Diagnosis present

## 2014-05-11 DIAGNOSIS — R0989 Other specified symptoms and signs involving the circulatory and respiratory systems: Secondary | ICD-10-CM

## 2014-05-11 LAB — URINE CULTURE: Colony Count: 4000

## 2014-05-11 LAB — CBC
HEMATOCRIT: 42.7 % (ref 39.0–52.0)
Hemoglobin: 14.1 g/dL (ref 13.0–17.0)
MCH: 29.2 pg (ref 26.0–34.0)
MCHC: 33 g/dL (ref 30.0–36.0)
MCV: 88.4 fL (ref 78.0–100.0)
Platelets: 172 10*3/uL (ref 150–400)
RBC: 4.83 MIL/uL (ref 4.22–5.81)
RDW: 13.5 % (ref 11.5–15.5)
WBC: 6 10*3/uL (ref 4.0–10.5)

## 2014-05-11 LAB — BASIC METABOLIC PANEL
Anion gap: 8 (ref 5–15)
BUN: 8 mg/dL (ref 6–23)
CO2: 27 mmol/L (ref 19–32)
CREATININE: 0.73 mg/dL (ref 0.50–1.35)
Calcium: 8.2 mg/dL — ABNORMAL LOW (ref 8.4–10.5)
Chloride: 102 mmol/L (ref 96–112)
GFR calc Af Amer: >90 mL/min (ref >90)
GFR calc non Af Amer: >90 mL/min (ref >90)
GLUCOSE: 214 mg/dL — AB (ref 70–99)
Potassium: 4.2 mmol/L (ref 3.5–5.1)
SODIUM: 137 mmol/L (ref 135–145)

## 2014-05-11 LAB — HEMOGLOBIN A1C
Hgb A1c MFr Bld: 14.2 % — ABNORMAL HIGH (ref 4.8–5.6)
Mean Plasma Glucose: 361 mg/dL

## 2014-05-11 LAB — GLUCOSE, CAPILLARY
Glucose-Capillary: 198 mg/dL — ABNORMAL HIGH (ref 70–99)
Glucose-Capillary: 202 mg/dL — ABNORMAL HIGH (ref 70–99)

## 2014-05-11 MED ORDER — INSULIN GLARGINE 100 UNIT/ML ~~LOC~~ SOLN
22.0000 [IU] | Freq: Every day | SUBCUTANEOUS | Status: DC
  Administered 2014-05-11: 22 [IU] via SUBCUTANEOUS
  Filled 2014-05-11 (×2): qty 0.22

## 2014-05-11 MED ORDER — INSULIN ASPART 100 UNIT/ML ~~LOC~~ SOLN
6.0000 [IU] | Freq: Three times a day (TID) | SUBCUTANEOUS | Status: DC
  Administered 2014-05-11 – 2014-05-12 (×4): 6 [IU] via SUBCUTANEOUS

## 2014-05-11 NOTE — Progress Notes (Signed)
[facility]                                                                              Patient Demographics  Mitchell [name], is a 64 y.o. male, DOB - [birth year], BEE:[id number]  Admit date - 05/09/2014   Admitting Physician Doree Albee, MD  Outpatient Primary MD for the patient is [facility], MD  LOS - 2   Chief Complaint  Patient presents with  . Foot Pain      HPI on 05/09/2014 by Dr. Hurshel Party This is a 64 year old man who complains of right foot swelling and pain. In fact, on closer questioning, he has had problems with the right lower leg for approximately one month but did not seek medical advise. His friend finally convinced him to come to the emergency room. He apparently went to a podiatrist and had some sort of procedure on the sole of the foot. He has had increased redness and pain since that time and the redness now seems to be creeping up his right leg. He denies any specific fevers but does have history of chills and feeling weak. He denies any nausea, vomiting or diarrhea. There is no abdominal pain. There is no chest pain, palpitations, or limb weakness. He is now being admitted for management of the cellulitis of his right lower leg.  Interim history Patient wishes to have podiatry consulted while in the hospital. Currently he is afebrile with no leukocytosis. Care was consulted. Suspect patient may be able to be discharged after podiatry has evaluated him.  Assessment & Plan   Right Lower Extremity Cellulitis -Has several uclerations -Right foot xra: no acute osseous injury -Wound care and podiatry consulted and appreciated -very weak pulses palpated on RLE -ABI pending -ESR and CRP elevated -Initially placed vancomycin and aztreonam (PCN allergy), flagyl- however changed to vanc and primaxin  Brief hypotension -Resolved with IVF, BP has remained normal -Continue to monitor closely  Hyperglycemia/uncontrolled Diabetes Mellitus, Type 2 -Glucose  >600 upon admission, required brief insulin drip. -Glucose improved, currently 255, anion gap 8 -HbA1c 14.2 -Diabetes coordinator consulted -Increase Lantus to 22 units daily at bedtime, NovoLog 6 units 3 times a day with males -Continue insulin sliding scale CBG monitoring -metformin held  -Patient does admit to being noncompliant, and has run out his medications prior to coming into the hospital.  Hyperlipidemia -Continue statin  Peripheral neuropathy secondary to diabetes -Continue gabapentin  Hyponatremia -Resolved, likely related to hyperglycemia -Sodium at admission 130, corrected was 135 -Currently 137 -Continue to monitor BMP  Code Status: Full  Family Communication: None at bedside  Disposition Plan: Admitted. Continue IV antibiotics, pending podiatry consult.  Time Spent in minutes   30 minutes  Procedures  None  Consults   Wound care Podiatry  DVT Prophylaxis  Heparin  Lab Results  Component Value Date   PLT 172 05/11/2014    Medications  Scheduled Meds: . aspirin  325 mg Oral Daily  . furosemide  20 mg Oral Daily  . gabapentin  300 mg Oral TID  . heparin  5,000 Units Subcutaneous 3 times per day  . imipenem-cilastatin  500 mg Intravenous Q6H  . insulin aspart  0-20 Units Subcutaneous TID WC  . insulin aspart  0-5 Units Subcutaneous QHS  . insulin aspart  6 Units Subcutaneous TID WC  . insulin glargine  22 Units Subcutaneous QHS  . mupirocin cream   Topical Daily  . nicotine  21 mg Transdermal Daily  . pravastatin  20 mg Oral q1800  . sodium chloride  3 mL Intravenous Q12H  . vancomycin  1,250 mg Intravenous Q12H   Continuous Infusions: . sodium chloride 125 mL/hr at 05/11/14 1328   PRN Meds:.HYDROcodone-acetaminophen, ondansetron **OR** ondansetron (ZOFRAN) IV  Antibiotics    Anti-infectives    Start     Dose/Rate Route Frequency Ordered Stop   05/10/14 1500  imipenem-cilastatin (PRIMAXIN) 500 mg in sodium chloride 0.9 % 100 mL IVPB       500 mg 200 mL/hr over 30 Minutes Intravenous Every 6 hours 05/10/14 1246     05/10/14 0500  metroNIDAZOLE (FLAGYL) IVPB 500 mg  Status:  Discontinued     500 mg 100 mL/hr over 60 Minutes Intravenous Every 8 hours 05/10/14 0411 05/10/14 1235   05/10/14 0430  aztreonam (AZACTAM) 1 g in dextrose 5 % 50 mL IVPB  Status:  Discontinued     1 g 100 mL/hr over 30 Minutes Intravenous Every 8 hours 05/10/14 0411 05/10/14 1235   05/10/14 0415  vancomycin (VANCOCIN) 1,250 mg in sodium chloride 0.9 % 250 mL IVPB     1,250 mg 166.7 mL/hr over 90 Minutes Intravenous Every 12 hours 05/10/14 0411     05/09/14 1530  aztreonam (AZACTAM) 2 g in dextrose 5 % 50 mL IVPB     2 g 100 mL/hr over 30 Minutes Intravenous  Once 05/09/14 1526 05/09/14 1632   05/09/14 1530  metroNIDAZOLE (FLAGYL) IVPB 500 mg     500 mg 100 mL/hr over 60 Minutes Intravenous  Once 05/09/14 1526 05/09/14 1648   05/09/14 1530  vancomycin (VANCOCIN) IVPB 1000 mg/200 mL premix     1,000 mg 200 mL/hr over 60 Minutes Intravenous  Once 05/09/14 1526 05/09/14 1727        Subjective:   Mitchell [name] seen and examined today.  Patient continues to complain about his previous doctors and how he spends money every 3 months to see them.  He denies foot pain at this time.  He wants to know how to heel his ulcers.  Patient admitted to being noncompliant with medications and diet, and running out of his insulin.   Denies chest pain, shortness of breath, abdominal pain, N/V/C/D.    Objective:   Filed Vitals:   05/10/14 1600 05/10/14 2108 05/11/14 0655 05/11/14 1357  BP: 120/70 119/82 129/64 121/64  Pulse: 63 69 77 80  Temp: 97.5 F (36.4 C) 99.2 F (37.3 C) 98.1 F (36.7 C) 99.3 F (37.4 C)  TempSrc: Oral Oral Oral Oral  Resp: 19 16 18 18   Height:      Weight:      SpO2: 98% 96% 98% 94%    Wt Readings from Last 3 Encounters:  05/09/14 103.874 kg (229 lb)  03/22/13 101.833 kg (224 lb 8 oz)  03/19/12 120.203 kg (265 lb)      Intake/Output Summary (Last 24 hours) at 05/11/14 1524 Last data filed at 05/11/14 1310  Gross per 24 hour  Intake   3055 ml  Output   3250 ml  Net   -195 ml    Exam  General: Well developed, well nourished, no distress  HEENT: NCAT, mucous membranes moist.  Cardiovascular: normal S1/S2, RRR, no murmurs  Respiratory: Clear to auscultation  Abdomen: Soft, obese,  nontender, nondistended, + bowel sounds  Extremities: warm dry without cyanosis clubbing.  RLE erythema and edema (improving) extending from the foot to the mid calf, ulcerations noted on right foot-several areas  Neuro: AAOx3, nonfocal   Data Review   Micro Results Recent Results (from the past 240 hour(s))  Blood Culture (routine x 2)     Status: None (Preliminary result)   Collection Time: 05/09/14  3:36 PM  Result Value Ref Range Status   Specimen Description LEFT ANTECUBITAL  Final   Special Requests BOTTLES DRAWN AEROBIC AND ANAEROBIC 6CC  Final   Culture NO GROWTH 2 DAYS  Final   Report Status PENDING  Incomplete  Blood Culture (routine x 2)     Status: None (Preliminary result)   Collection Time: 05/09/14  3:36 PM  Result Value Ref Range Status   Specimen Description BLOOD RIGHT HAND  Final   Special Requests BOTTLES DRAWN AEROBIC ONLY [place]  Final   Culture NO GROWTH 2 DAYS  Final   Report Status PENDING  Incomplete  Urine culture     Status: None   Collection Time: 05/09/14  4:50 PM  Result Value Ref Range Status   Specimen Description URINE, CLEAN CATCH  Final   Special Requests NONE  Final   Colony Count   Final    4,000 COLONIES/ML Performed at [facility]    Culture   Final    INSIGNIFICANT GROWTH Performed at [facility]    Report Status 05/11/2014 FINAL  Final  MRSA PCR Screening     Status: None   Collection Time: 05/09/14  7:26 PM  Result Value Ref Range Status   MRSA by PCR NEGATIVE NEGATIVE Final    Comment:        The GeneXpert MRSA Assay (FDA approved  for NASAL specimens only), is one component of a comprehensive MRSA colonization surveillance program. It is not intended to diagnose MRSA infection nor to guide or monitor treatment for MRSA infections.     Radiology Reports Dg Chest Port 1 View  05/09/2014   CLINICAL DATA:  Cough for 6 months.  EXAM: PORTABLE CHEST - 1 VIEW  COMPARISON:  03/21/2013  FINDINGS: The heart size and mediastinal contours are within normal limits. Both lungs are clear. The visualized skeletal structures are unremarkable.  IMPRESSION: No active disease.   Electronically Signed   By: Kathreen Devoid   On: 05/09/2014 17:06   Dg Foot 2 Views Right  05/09/2014   CLINICAL DATA:  Diabetes.  Foot pain.  EXAM: RIGHT FOOT - 2 VIEW  COMPARISON:  None.  FINDINGS: There is no evidence of fracture or dislocation. There are enthesopathic changes of the Achilles tendon insertion. There is a small plantar calcaneal spur. There is no bone destruction or periosteal reaction. Soft tissues are unremarkable.  IMPRESSION: No acute osseous injury of the right foot.   Electronically Signed   By: Kathreen Devoid   On: 05/09/2014 17:08    CBC  Recent Labs Lab 05/09/14 1536 05/10/14 0447 05/11/14 0653  WBC 7.8 6.1 6.0  HGB 15.9 13.8 14.1  HCT 46.7 42.0 42.7  PLT 182 166 172  MCV 88.4 87.9 88.4  MCH 30.1 28.9 29.2  MCHC 34.0 32.9 33.0  RDW 13.5 13.7 13.5  LYMPHSABS 1.5  --   --   MONOABS 0.6  --   --   EOSABS 0.2  --   --  BASOSABS 0.0  --   --     Chemistries   Recent Labs Lab 05/09/14 1536 05/10/14 0447 05/11/14 0653  NA 130* 137 137  K 4.0 3.8 4.2  CL 93* 104 102  CO2 27 25 27   GLUCOSE 615* 255* 214*  BUN 13 9 8   CREATININE 0.89 0.70 0.73  CALCIUM 8.6 7.9* 8.2*  AST 16 14  --   ALT 19 15  --   ALKPHOS 136* 98  --   BILITOT 0.6 0.6  --    ------------------------------------------------------------------------------------------------------------------ estimated creatinine clearance is 114.4 mL/min (by C-G  formula based on Cr of 0.73). ------------------------------------------------------------------------------------------------------------------  Recent Labs  05/10/14 0447  HGBA1C 14.2*   ------------------------------------------------------------------------------------------------------------------ No results for input(s): CHOL, HDL, LDLCALC, TRIG, CHOLHDL, LDLDIRECT in the last 72 hours. ------------------------------------------------------------------------------------------------------------------ No results for input(s): TSH, T4TOTAL, T3FREE, THYROIDAB in the last 72 hours.  Invalid input(s): FREET3 ------------------------------------------------------------------------------------------------------------------ No results for input(s): VITAMINB12, FOLATE, FERRITIN, TIBC, IRON, RETICCTPCT in the last 72 hours.  Coagulation profile No results for input(s): INR, PROTIME in the last 168 hours.  No results for input(s): DDIMER in the last 72 hours.  Cardiac Enzymes No results for input(s): CKMB, TROPONINI, MYOGLOBIN in the last 168 hours.  Invalid input(s): CK ------------------------------------------------------------------------------------------------------------------ Invalid input(s): POCBNP    Logan Vegh D.O. on 05/11/2014 at 3:24 PM  Between 7am to 7pm - Pager - [phone]  After 7pm go to [email or web address] - password [email or web address]  And look for the night coverage person covering for me after hours  [facility] Office  [phone]

## 2014-05-11 NOTE — Care Management Note (Addendum)
    Page 1 of 1   05/12/2014     2:29:21 PM CARE MANAGEMENT NOTE 05/12/2014  [facility]   Account Number:  [id number]  Date Initiated:  05/11/2014  Documentation initiated by:  Kathyrn SheriffHILDRESS,JESSICA  Subjective/Objective Assessment:   Pt is from home, indepdendent at baseline. Pt admitted with cellulitis. PT has recommended pt use rolling walker. Pt has walker at home and is not interested in having one ordered. No further CM needs. Pt plans to discharge home with self c     Action/Plan:   Anticipated DC Date:  05/12/2014   Anticipated DC Plan:  HOME/SELF CARE      DC Planning Services  CM consult      Choice offered to / List presented to:             Status of service:  Completed, signed off Medicare Important Message given?   (If response is "NO", the following Medicare IM given date fields will be blank) Date Medicare IM given:   Medicare IM given by:   Date Additional Medicare IM given:   Additional Medicare IM given by:    Discharge Disposition:  HOME/SELF CARE  Per UR Regulation:    If discussed at Long Length of Stay Meetings, dates discussed:    Comments:  05/12/2014 1400 Kathyrn SheriffJessica Childress, RN, MSN, CM [facility]. Pt refuses and feels him and his wife will be able to do his wound care. Pt's wife is Research scientist (physical sciences)healthcare worker at SNF. No further CM needs.  05/11/2014 1500 Kathyrn SheriffJessica Childress, RN, MSN, CM

## 2014-05-11 NOTE — Clinical Documentation Improvement (Signed)
Presents with possible sepsis and cellulitis of right lower limb. Has diabetes 2 with hyperglycemia (615) on admission.   Peripheral neuropathy is documented with continue Gabapentin  1. Please clarify if there is a possible link of the peripheral neuropathy (or any other diabetic manifestation) to his diabetes, unrelated to diabetes, other condition, cannot clinically determine  2. Please clarify if there is any link of the possible diabetic manifestation to his cellulitis (cause and effect relationship)  Thank You, Shellee MiloEileen T Leyda Vanderwerf ,RN Clinical Documentation Specialist:  [phone]  [facility]- Health Information Management

## 2014-05-11 NOTE — Evaluation (Signed)
Physical Therapy Evaluation Patient Details Name: Mitchell [name] MRN: [id number] DOB: [birth year] Today's Date: 05/11/2014   History of Present Illness  This is a 64 year old man who complains of right foot swelling and pain. In fact, on closer questioning, he has had problems with the right lower leg for approximately one month but did not seek medical advise. His friend finally convinced him to come to the emergency room. He apparently went to a podiatrist and had some sort of procedure on the sole of the foot. He has had increased redness and pain since that time and the redness now seems to be creeping up his right leg. He denies any specific fevers but does have history of chills and feeling weak. He denies any nausea, vomiting or diarrhea. There is no abdominal pain. There is no chest pain, palpitations, or limb weakness. He is now being admitted for management of the cellulitis of his right lower leg.  Clinical Impression   Pt is seen for evaluation.  He is oriented and cooperative, had been independent at home PTA.  Currently, his right LE is edematous with bandaging over the heel and plantar surface of the foot.  He has no pain.  He was instructed in gait using a walker, TTWB right.  He was instructed in the reasoning behind the use of a walker and he was asked to minimize gait as possible.  He was also asked to elevate the right foot as much as possible and to do foot pumps throughout the day.  He was instructed in proper elevation.  He is now independent with the use of a walker and understands all teaching.  No further PT should be needed.  He will need a rolling walker for home use.    Follow Up Recommendations No PT follow up    Equipment Recommendations  Rolling walker with 5" wheels    Recommendations for Other Services   none    Precautions / Restrictions Precautions Precautions: None Restrictions Weight Bearing Restrictions: No Other Position/Activity Restrictions: Pt should be  minimizing weight on his right foot due to the wound on his heel       Mobility  Bed Mobility Overal bed mobility: Independent                Transfers Overall transfer level: Independent                  Ambulation/Gait Ambulation/Gait assistance: Modified independent (Device/Increase time) Ambulation Distance (Feet): 30 Feet Assistive device: Rolling walker (2 wheeled) Gait Pattern/deviations: WFL(Within Functional Limits) Gait velocity: appropriate for situation   General Gait Details: TTWB on the right                   Balance Overall balance assessment: Independent                                           Pertinent Vitals/Pain Pain Assessment: No/denies pain    Home Living Family/patient expects to be discharged to:: Private residence Living Arrangements: Spouse/significant other Available Help at Discharge: Family;Available PRN/intermittently Type of Home: House Home Access: Level entry     Home Layout: One level Home Equipment: Cane - single point      Prior Function Level of Independence: Independent  Extremity/Trunk Assessment   Upper Extremity Assessment: Overall WFL for tasks assessed           Lower Extremity Assessment: Overall WFL for tasks assessed      Cervical / Trunk Assessment: Normal  Communication   Communication: No difficulties  Cognition Arousal/Alertness: Awake/alert Behavior During Therapy: WFL for tasks assessed/performed Overall Cognitive Status: Within Functional Limits for tasks assessed                            Exercises General Exercises - Lower Extremity Ankle Circles/Pumps: AROM;Right;20 reps;Supine      Assessment/Plan    PT Assessment Patent does not need any further PT services  PT Diagnosis Difficulty walking   PT Problem List    PT Treatment Interventions     PT Goals (Current goals can be found in the Care Plan  section) Acute Rehab PT Goals PT Goal Formulation: All assessment and education complete, DC therapy         Barriers to discharge  none                     End of Session Equipment Utilized During Treatment: Gait belt Activity Tolerance: Patient tolerated treatment well Patient left: in bed;with call bell/phone within reach;with nursing/sitter in room Nurse Communication: Mobility status         Time: [id number] PT Time Calculation (min) (ACUTE ONLY): 35 min   Charges:   PT Evaluation $Initial PT Evaluation Tier I: 1 Procedure PT Treatments $Self Care/Home Management: 8-22   PT G Codes:        Kenedie Dirocco, Debarah Crapelaudia L 05/11/2014, 11:59 AM

## 2014-05-11 NOTE — Clinical Documentation Improvement (Signed)
ED notes reveal "foot/leg cellulitis/sepsis- Patient received iv antibiotics, fluid bolus, lactic acid per sepsis pathway".    Sepsis not documented any further in Medical Record or listed on Problem List  Has cellulitis of lower right limb and is being treated with IV Vancomycin and Aztreonam  Please clarify if you feel Sepsis has ruled in or has ruled out after careful study and document findings in next progress note so chart can be coded accurately.   Other Condition  Cannot clinically Determine   Thank You, Shellee MiloEileen T Kazimierz Springborn ,RN Clinical Documentation Specialist:  [phone]  [facility]- Health Information Management

## 2014-05-11 NOTE — Progress Notes (Addendum)
Inpatient Diabetes Program Recommendations  AACE/ADA: New Consensus Statement on Inpatient Glycemic Control (2013)  Target Ranges:  Prepandial:   less than 140 mg/dL      Peak postprandial:   less than 180 mg/dL (1-2 hours)      Critically ill patients:  140 - 180 mg/dL   Results for Mitchell [name], Mitchell [name] (MRN [id number]) as of 05/11/2014 08:38  Ref. Range 05/10/2014 07:46 05/10/2014 11:48 05/10/2014 16:11 05/10/2014 21:13 05/11/2014 08:00  Glucose-Capillary Latest Ref Range: 70-99 mg/dL 102246 (H) 725274 (H) 366131 (H) 193 (H) 198 (H)   Results for Mitchell [name], Mitchell [name] (MRN [id number]) as of 05/11/2014 08:38  Ref. Range 05/10/2014 04:47  Hemoglobin A1C Latest Ref Range: 4.8-5.6 % 14.2 (H)   Diabetes history: DM2 Outpatient Diabetes medications: Lantus 15 units daily, Novolog 6 units TID with meals, Metformin 1000 mg BID Current orders for Inpatient glycemic control: Lantus 20 units daily, Novolog 0-20 units TID with meals, Novolog 0-5 units HS, Novolog 3 units TID with meals  Inpatient Diabetes Program Recommendations Insulin - Basal: Please consider increasing Lantus to 22 units QHS. Insulin - Meal Coverage: Please consider increasing meal coverage to Novolog 6 units TID with meals.   05/11/14@15 :00-Spoke with patient about diabetes and home regimen for diabetes control. Patient reports that he [place] diabetes [place] and currently he takes Lantus 26 units TID with meals and Novolog 6 units TID with meals as an outpatient for diabetes control. Questioned why he is taking Lantus 3 times a day and patient reports that his doctor told him to start taking it this way several months ago. Patient reports that he was taking Metformin in the past but his PCP told him to stop the Metformin and started him on a newer oral diabetes medication. Patient does not remember the name of the new DM medication but states that the doctor had given him samples of the medication to last a few months and he  has been out of the medication for a few weeks. According to the patient his glucose was better when he was taking the oral DM medication. Patient reports that he did [place] him to an insurance policy she had gotten. According to the patient he "will be getting Medicare which will be active in July".  Inquired about ability to get medications and patient states that he is able to get all of his medication due to a program called [employer] which he has; reports [facility] (for all of them). Patient states that he checks his glucose twice a day and it has been running high. Inquired about knowledge about A1C and patient reports that he knows what an A1C is and his last A1C was high. Discussed A1C results (14.2% on 05/10/14) and reviewed what an A1C is and how his current A1C correlates to an actual glucose value of around 370 mg/dl. Discussed basic pathophysiology of DM Type 2, basic home care, importance of checking CBGs and maintaining good CBG control to prevent long-term and short-term complications. Discussed impact of nutrition, exercise, stress, sickness, and medications on diabetes control.  Explained in detail how high glucose damages inner lining of blood vessels and leads to complications. Stressed importance of improving glycemic control especially with leg wound and was frank about possibility of amputation if diabetes control is not improved substantially. Patient states that he does  not eat the right foods and admits to drinking sugary drinks, eating sweets, and lots of fried foods. Patient reports that he is disabled and he can not afford to eat healthy. Patient reports that he and his wife are raising 2 of their grandchildren who are 52 years old twins. Encouraged patient to check with the health department and see if he would qualify for food stamps or some other form of  assistance. Discussed carbohydrates, carbohydrate goals per day and meal, along with portion sizes. Will place consult for RD to provide further education on carb modified diet. Encouraged patient to make major dietary changes, to check his glucose 3-4 times per day, to take medications as prescribed, and to follow up with his PCP at least every 3 months.  Informed patient I would call Dr. Margaretmary Eddy office to get a list of DM medications he is prescribed. Patient states that he realizes that he has got to make major changes because he wants to keep his leg and be around for his grandchildren. Patient verbalized understanding of information discussed and he states that he has no further questions at this time related to diabetes.   Called Dr. Margaretmary Eddy office and was told that patient's last office visit with Dr. Sherryll [name] was November 09, 2013 and patient currently has a balance on his account. The medications that he is prescribed according to their chart from November 09, 2013 are: Lantus 55-75 units QAM, Novolog 5 units TID with meals, Invokana 300 mg daily.   Thanks, Orlando Penner, RN, MSN, CCRN, CDE Diabetes Coordinator Inpatient Diabetes Program [phone] (Team Pager from 8am to 5pm) [phone] ([facility] office) [phone] [facility] office)

## 2014-05-12 DIAGNOSIS — L97511 Non-pressure chronic ulcer of other part of right foot limited to breakdown of skin: Secondary | ICD-10-CM

## 2014-05-12 DIAGNOSIS — Z794 Long term (current) use of insulin: Secondary | ICD-10-CM

## 2014-05-12 DIAGNOSIS — L97519 Non-pressure chronic ulcer of other part of right foot with unspecified severity: Secondary | ICD-10-CM | POA: Diagnosis present

## 2014-05-12 DIAGNOSIS — E11621 Type 2 diabetes mellitus with foot ulcer: Secondary | ICD-10-CM | POA: Diagnosis present

## 2014-05-12 DIAGNOSIS — Z9114 Patient's other noncompliance with medication regimen: Secondary | ICD-10-CM

## 2014-05-12 LAB — GLUCOSE, CAPILLARY
GLUCOSE-CAPILLARY: 223 mg/dL — AB (ref 70–99)
Glucose-Capillary: 188 mg/dL — ABNORMAL HIGH (ref 70–99)
Glucose-Capillary: 156 mg/dL — ABNORMAL HIGH (ref 70–99)

## 2014-05-12 LAB — CBC
HCT: 42.4 % (ref 39.0–52.0)
HEMOGLOBIN: 13.8 g/dL (ref 13.0–17.0)
MCH: 28.7 pg (ref 26.0–34.0)
MCHC: 32.5 g/dL (ref 30.0–36.0)
MCV: 88.1 fL (ref 78.0–100.0)
Platelets: 179 10*3/uL (ref 150–400)
RBC: 4.81 MIL/uL (ref 4.22–5.81)
RDW: 13.4 % (ref 11.5–15.5)
WBC: 7 10*3/uL (ref 4.0–10.5)

## 2014-05-12 LAB — BASIC METABOLIC PANEL
Anion gap: 6 (ref 5–15)
BUN: 7 mg/dL (ref 6–23)
CHLORIDE: 105 mmol/L (ref 96–112)
CO2: 27 mmol/L (ref 19–32)
Calcium: 8.2 mg/dL — ABNORMAL LOW (ref 8.4–10.5)
Creatinine, Ser: 0.64 mg/dL (ref 0.50–1.35)
GFR calc Af Amer: >90 mL/min (ref >90)
Glucose, Bld: 168 mg/dL — ABNORMAL HIGH (ref 70–99)
Potassium: 3.7 mmol/L (ref 3.5–5.1)
SODIUM: 138 mmol/L (ref 135–145)

## 2014-05-12 MED ORDER — HYDROCODONE-ACETAMINOPHEN 5-325 MG PO TABS: 1.0000 | ORAL_TABLET | ORAL | Status: DC | PRN

## 2014-05-12 MED ORDER — DOXYCYCLINE HYCLATE 100 MG PO TABS: 100.0000 mg | ORAL_TABLET | Freq: Two times a day (BID) | ORAL | Status: DC

## 2014-05-12 MED ORDER — MUPIROCIN CALCIUM 2 % EX CREA: TOPICAL_CREAM | Freq: Every day | CUTANEOUS | Status: DC

## 2014-05-12 MED ORDER — INSULIN ASPART 100 UNIT/ML FLEXPEN: 9.0000 [IU] | PEN_INJECTOR | Freq: Three times a day (TID) | SUBCUTANEOUS | Status: DC

## 2014-05-12 MED ORDER — INSULIN GLARGINE 100 UNIT/ML SOLOSTAR PEN: 30.0000 [IU] | PEN_INJECTOR | Freq: Two times a day (BID) | SUBCUTANEOUS | Status: DC

## 2014-05-12 NOTE — Progress Notes (Signed)
Patient discharged home. IV removed - WNL.  Reviewed medications including new prescriptions with patient.  Emphasized importance of completing dose of abx to prevent further infection.  Instructed on wound care to R foot.  Patient verbalizes understanding. No questions at this time regarding DC instructions.  Left floor via WC with NT assist in stable condition.  Patient did state that he can not find a bag with belongings in it.  Not located in present room and none found in ICU where patient was transferred from.  Told patient we would call if they were found, uncertain if someone took them home with out his knowledge.

## 2014-05-12 NOTE — Discharge Summary (Addendum)
Physician Discharge Summary  Mitchell [name] ZOX:[id number] DOB: 02-May-1950 DOA: 05/09/2014  PCP: Kirstie PeriSHAH,ASHISH, MD  Admit date: 05/09/2014 Discharge date: 05/12/2014  Time spent: Greater than 30 minutes  Recommendations for Outpatient Follow-up:  1. Patient was instructed to follow-up with podiatry once his insurance resumes.  2.   Discharge Diagnoses: 1. Right lower extremity cellulitis. 2. Right plantar foot ulceration, status post outpatient debridement prior to this hospitalization (by patient's former podiatrist). 3. Uncontrolled type 2 diabetes mellitus, insulin-dependent. Hemoglobin A1c 14.2. 4. Noncompliance with consistent insulin therapy. 5. Peripheral neuropathy secondary to diabetes. 6. Hyponatremia, likely from hypoglycemia. Resolved with IV fluids and treatment of hyperglycemia. 7. Obesity. 8. Tobacco abuse. The patient was advised to stop smoking.   Discharge Condition: Improved.  Diet recommendation: Carbohydrate modified.  Filed Weights   05/09/14 1454  Weight: 103.874 kg (229 lb)    History of present illness:  The patient is a 64 year old man with a history of insulin requiring diabetes mellitus, arthritis, and hyperlipidemia, who presented to the emergency department on 05/09/14 with a complaint of right foot swelling and pain. He also complained of redness of his right leg. A few days prior to the hospitalization, his podiatrist apparently debrided a right foot wound. Several days following the debridement, the patient's foot began to swell and redness began to streak up his right leg. He complained of generalized weakness and chills, but no subjective fever. In the ED, he was afebrile and hemodynamically stable. His lab data were significant for a sodium of 1:30, glucose of 6:15, and normal WBC. X-ray of the foot revealed no osseous injury or signs of osteomyelitis. He was admitted for further evaluation and management.  Hospital Course:   1. Right lower extremity  cellulitis with plantar ulceration. The patient was started on IV antibiotic therapy with Flagyl, vancomycin and aztreonam. (The patient is penicillin allergic). Supportive treatment was given with IV fluids and analgesics as needed. For further evaluation, a number studies were ordered. His C-reactive protein was elevated at 4.6. His sedimentation rate was marginally elevated at 33. Blood cultures were ordered and were negative to date. His ABI revealed no significant vaso-occlusive disease. The cellulitis resolved. The plantar ulcer, which was likely from the recent outpatient debridement, was initially draining serous fluid, but the drainage resolved. Wound care nurse was consulted and recommended application of Bactroban, a 4 x 4 gauze to be applied over the debrided wound site with Kerlix to keep the dressing in place. She recommended that the patient follow-up with his podiatrist, but he requested a new podiatrist. He was referred to Dr. Loralie ChampagneMcKinney's office, but the patient was instructed to call for an appointment after he acquires Medicare part B for coverage. The patient voiced understanding. At the time of discharge, there was no evidence of plantar drainage or right leg cellulitis. He remained afebrile and his white blood cell count was within normal limits. He was discharged on 5 more days of doxycycline.  Uncontrolled type 2 diabetes mellitus with neuropathy. The patient had been recently started on Invokana and metformin was discontinued by his PCP. He is also chronically treated with NovoLog and Lantus. The patient's venous glucose was 615 on admission. He was not in DKA, but was rather in a hyperosmolar state. He admitted to not being completely compliant with insulin therapy at home. He was given a bolus of IV fluids and insulin in the ED which improved his venous glucose to 340. For treatment, he was started on sliding scale NovoLog and Lantus.  The dosing was titrated accordingly. His  hemoglobin A1c was 14.4. At the time of discharge, insulin regimen was changed to 30 units of Lantus twice a day and 9 units of NovoLog 3 times a day with meals which was increased from his previous home dosing. He was instructed to discuss further treatment with his PCP. He was encouraged to be more compliant with insulin therapy. He voiced understanding.  Hyponatremia. The patient's hyponatremia was likely secondary to hyperglycemia. He was started on vigorous IV fluids and his diabetes was treated accordingly. Hyponatremia resolved.   Procedures:  None  Consultations:  Wound care nurse.  Discharge Exam: Filed Vitals:   05/12/14 1357  BP: 129/73  Pulse: 78  Temp: 98.4 F (36.9 C)  Resp: 20    General: Pleasant obese 64 year old man in no acute distress. Cardiovascular: S1, S2, no murmurs or gallops. Respiratory: Clear to auscultation bilaterally. Abdomen: Obese, positive bowel sounds, soft, nontender, nondistended. Extremities: Right lower extremity with no evidence of pretibial erythema. Scant amount of erythema around the proximal dorsum. Plantar surface with healing full-thickness wound with no active drainage and surrounding calluses.  Discharge Instructions   Discharge Instructions    Diet - low sodium heart healthy    Complete by:  As directed      Diet Carb Modified    Complete by:  As directed      Discharge instructions    Complete by:  As directed   Take medications as prescribed.     Discharge wound care:    Complete by:  As directed   To the right sole of her foot apply Bactroban or Triple Antibiotic ointment, cover with 4 x 4 nonstick dressing and then secure with Kerlix. Change daily.     Increase activity slowly    Complete by:  As directed           Current Discharge Medication List    START taking these medications   Details  HYDROcodone-acetaminophen (NORCO/VICODIN) 5-325 MG per tablet Take 1 tablet by mouth every 4 (four) hours as needed  for moderate pain. Qty: 30 tablet, Refills: 0    mupirocin cream (BACTROBAN) 2 % Apply topically daily. Qty: 15 g, Refills: 2      CONTINUE these medications which have CHANGED   Details  doxycycline (VIBRA-TABS) 100 MG tablet Take 1 tablet (100 mg total) by mouth every 12 (twelve) hours. Antibiotic. Starting tomorrow, take 1 tablet 2 times daily for 5 days. Qty: 10 tablet, Refills: 0    insulin aspart (NOVOLOG) 100 UNIT/ML FlexPen Inject 9 Units into the skin 3 (three) times daily with meals.    Insulin Glargine (LANTUS) 100 UNIT/ML Solostar Pen Inject 30 Units into the skin 2 (two) times daily.      CONTINUE these medications which have NOT CHANGED   Details  aspirin 325 MG tablet Take 325 mg by mouth daily.    furosemide (LASIX) 20 MG tablet Take 1 tablet (20 mg total) by mouth daily. Qty: 30 tablet, Refills: 0    gabapentin (NEURONTIN) 300 MG capsule Take 1 capsule (300 mg total) by mouth 3 (three) times daily. Qty: 90 capsule, Refills: 0    lovastatin (MEVACOR) 20 MG tablet Take 1 tablet (20 mg total) by mouth at bedtime. Qty: 30 tablet, Refills: 0    canagliflozin (INVOKANA) 300 MG TABS tablet Take 300 mg by mouth daily before breakfast.      STOP taking these medications     ibuprofen (ADVIL,MOTRIN) 200 MG  tablet      metFORMIN (GLUCOPHAGE) 500 MG tablet        Allergies  Allergen Reactions  . Penicillins Itching and Rash   Follow-up Information    Follow up with [facility], MD. Schedule an appointment as soon as possible for a visit in 1 week.   Specialty:  Internal Medicine   Contact information:   [address]  [place] [address] [phone]       Follow up with Dallas Schimke, DPM.   Specialty:  Podiatry   Why:  Podiatrist. Call to make an appointment after you get your Medicare coverage.   Contact information:   [address] [place] [address] [phone]        The results of significant diagnostics from this  hospitalization (including imaging, microbiology, ancillary and laboratory) are listed below for reference.    Significant Diagnostic Studies: US Arterial Seg Single  05/27/14   CLINICAL DATA:  Right lower extremity rest pain and claudication symptoms. Right heel and plantar ulcer. History of smoking. Current smoker. Remote history of varicose vein treatment. Evaluate for PAD.  EXAM: NONINVASIVE PHYSIOLOGIC VASCULAR STUDY OF BILATERAL LOWER EXTREMITIES  TECHNIQUE: Evaluation of both lower extremities were performed at rest, including calculation of ankle-brachial indices with single level Doppler, pressure and pulse volume recording.  COMPARISON:  None.  FINDINGS: Right ABI:  1.23  Left ABI:  1.23  ABI reference:  >1.4: Non diagnostic secondary to incompressible vessel calcifications (medial arterial sclerosis of Monckeberg)  1.0-1.4: Normal  0.9-0.99: Borderline PAD  0.8-0.89: Mild PAD  0.5-0.79: Moderate PAD  < 0.5: Severe PAD  IMPRESSION: No evidence of hemodynamically significant vasoocclusive disease affecting either lower extremity on this resting examination.   Electronically Signed   By: Simonne Come M.D.   On: 2014/05/27 16:44   Dg Chest Port 1 View  05/09/2014   CLINICAL DATA:  Cough for 6 months.  EXAM: PORTABLE CHEST - 1 VIEW  COMPARISON:  03/21/2013  FINDINGS: The heart size and mediastinal contours are within normal limits. Both lungs are clear. The visualized skeletal structures are unremarkable.  IMPRESSION: No active disease.   Electronically Signed   By: Elige Ko   On: 05/09/2014 17:06   Dg Foot 2 Views Right  05/09/2014   CLINICAL DATA:  Diabetes.  Foot pain.  EXAM: RIGHT FOOT - 2 VIEW  COMPARISON:  None.  FINDINGS: There is no evidence of fracture or dislocation. There are enthesopathic changes of the Achilles tendon insertion. There is a small plantar calcaneal spur. There is no bone destruction or periosteal reaction. Soft tissues are unremarkable.  IMPRESSION: No acute osseous  injury of the right foot.   Electronically Signed   By: Elige Ko   On: 05/09/2014 17:08    Microbiology: Recent Results (from the past 240 hour(s))  Blood Culture (routine x 2)     Status: None (Preliminary result)   Collection Time: 05/09/14  3:36 PM  Result Value Ref Range Status   Specimen Description LEFT ANTECUBITAL  Final   Special Requests BOTTLES DRAWN AEROBIC AND ANAEROBIC 6CC  Final   Culture NO GROWTH 3 DAYS  Final   Report Status PENDING  Incomplete  Blood Culture (routine x 2)     Status: None (Preliminary result)   Collection Time: 05/09/14  3:36 PM  Result Value Ref Range Status   Specimen Description BLOOD RIGHT HAND  Final   Special Requests BOTTLES DRAWN AEROBIC ONLY 6CC  Final  Culture NO GROWTH 3 DAYS  Final   Report Status PENDING  Incomplete  Urine culture     Status: None   Collection Time: 05/09/14  4:50 PM  Result Value Ref Range Status   Specimen Description URINE, CLEAN CATCH  Final   Special Requests NONE  Final   Colony Count   Final    4,000 COLONIES/ML Performed at [facility]    Culture   Final    INSIGNIFICANT GROWTH Performed at [facility]    Report Status 05/11/2014 FINAL  Final  MRSA PCR Screening     Status: None   Collection Time: 05/09/14  7:26 PM  Result Value Ref Range Status   MRSA by PCR NEGATIVE NEGATIVE Final    Comment:        The GeneXpert MRSA Assay (FDA approved for NASAL specimens only), is one component of a comprehensive MRSA colonization surveillance program. It is not intended to diagnose MRSA infection nor to guide or monitor treatment for MRSA infections.      Labs: Basic Metabolic Panel:  Recent Labs Lab 05/09/14 1536 05/10/14 0447 05/11/14 0653 05/12/14 0649  NA 130* 137 137 138  K 4.0 3.8 4.2 3.7  CL 93* 104 102 105  CO2 GLUCOSE 615* 255* 214* 168*  BUN CREATININE 0.89 0.70 0.73 0.64  CALCIUM 8.6 7.9* 8.2* 8.2*   Liver Function Tests:  Recent  Labs Lab 05/09/14 1536 05/10/14 0447  AST 16 14  ALT 19 15  ALKPHOS 136* 98  BILITOT 0.6 0.6  PROT 6.8 5.7*  ALBUMIN 3.5 2.9*   No results for input(s): LIPASE, AMYLASE in the last 168 hours. No results for input(s): AMMONIA in the last 168 hours. CBC:  Recent Labs Lab 05/09/14 1536 05/10/14 0447 05/11/14 0653 05/12/14 0649  WBC 7.8 6.1 6.0 7.0  NEUTROABS 5.5  --   --   --   HGB 15.9 13.8 14.1 13.8  HCT 46.7 42.0 42.7 42.4  MCV 88.4 87.9 88.4 88.1  PLT 182 166 172 179   Cardiac Enzymes: No results for input(s): CKTOTAL, CKMB, CKMBINDEX, TROPONINI in the last 168 hours. BNP: BNP (last 3 results) No results for input(s): BNP in the last 8760 hours.  ProBNP (last 3 results) No results for input(s): PROBNP in the last 8760 hours.  CBG:  Recent Labs Lab 05/11/14 0800 05/11/14 1658 05/11/14 2314 05/12/14 0744 05/12/14 1222  GLUCAP 198* 202* 188* 156* 223*       Signed:  Pecola Haxton  [facility] 05/12/2014, 2:51 PM

## 2014-05-13 NOTE — Care Management Utilization Note (Signed)
UR completed 

## 2014-05-15 LAB — CULTURE, BLOOD (ROUTINE X 2)
CULTURE: NO GROWTH
Culture: NO GROWTH

## 2014-07-07 ENCOUNTER — Emergency Department (HOSPITAL_COMMUNITY): Payer: Medicare Other

## 2014-07-07 ENCOUNTER — Inpatient Hospital Stay (HOSPITAL_COMMUNITY)
Admission: EM | Admit: 2014-07-07 | Discharge: 2014-07-09 | DRG: 623 | Disposition: A | Discharge status: Home Health | Payer: Medicare Other | Attending: Internal Medicine | Admitting: Internal Medicine

## 2014-07-07 ENCOUNTER — Inpatient Hospital Stay (HOSPITAL_COMMUNITY): Payer: Medicare Other

## 2014-07-07 ENCOUNTER — Encounter (HOSPITAL_COMMUNITY): Payer: Self-pay | Admitting: Emergency Medicine

## 2014-07-07 DIAGNOSIS — Z720 Tobacco use: Secondary | ICD-10-CM | POA: Diagnosis present

## 2014-07-07 DIAGNOSIS — Z79899 Other long term (current) drug therapy: Secondary | ICD-10-CM | POA: Diagnosis not present

## 2014-07-07 DIAGNOSIS — B879 Myiasis, unspecified: Secondary | ICD-10-CM | POA: Diagnosis present

## 2014-07-07 DIAGNOSIS — L03115 Cellulitis of right lower limb: Secondary | ICD-10-CM | POA: Diagnosis present

## 2014-07-07 DIAGNOSIS — E119 Type 2 diabetes mellitus without complications: Secondary | ICD-10-CM

## 2014-07-07 DIAGNOSIS — E1142 Type 2 diabetes mellitus with diabetic polyneuropathy: Secondary | ICD-10-CM | POA: Diagnosis present

## 2014-07-07 DIAGNOSIS — Z794 Long term (current) use of insulin: Secondary | ICD-10-CM | POA: Diagnosis not present

## 2014-07-07 DIAGNOSIS — Z7982 Long term (current) use of aspirin: Secondary | ICD-10-CM | POA: Diagnosis not present

## 2014-07-07 DIAGNOSIS — E669 Obesity, unspecified: Secondary | ICD-10-CM | POA: Diagnosis present

## 2014-07-07 DIAGNOSIS — IMO0001 Reserved for inherently not codable concepts without codable children: Secondary | ICD-10-CM

## 2014-07-07 DIAGNOSIS — E1165 Type 2 diabetes mellitus with hyperglycemia: Secondary | ICD-10-CM | POA: Diagnosis present

## 2014-07-07 DIAGNOSIS — R739 Hyperglycemia, unspecified: Secondary | ICD-10-CM

## 2014-07-07 DIAGNOSIS — R0989 Other specified symptoms and signs involving the circulatory and respiratory systems: Secondary | ICD-10-CM

## 2014-07-07 DIAGNOSIS — M79671 Pain in right foot: Secondary | ICD-10-CM | POA: Diagnosis present

## 2014-07-07 DIAGNOSIS — Z6832 Body mass index (BMI) 32.0-32.9, adult: Secondary | ICD-10-CM | POA: Diagnosis not present

## 2014-07-07 DIAGNOSIS — E13621 Other specified diabetes mellitus with foot ulcer: Secondary | ICD-10-CM

## 2014-07-07 DIAGNOSIS — E11621 Type 2 diabetes mellitus with foot ulcer: Principal | ICD-10-CM | POA: Diagnosis present

## 2014-07-07 DIAGNOSIS — M609 Myositis, unspecified: Secondary | ICD-10-CM | POA: Diagnosis present

## 2014-07-07 DIAGNOSIS — L97519 Non-pressure chronic ulcer of other part of right foot with unspecified severity: Secondary | ICD-10-CM

## 2014-07-07 DIAGNOSIS — M1990 Unspecified osteoarthritis, unspecified site: Secondary | ICD-10-CM | POA: Diagnosis present

## 2014-07-07 DIAGNOSIS — F17210 Nicotine dependence, cigarettes, uncomplicated: Secondary | ICD-10-CM | POA: Diagnosis present

## 2014-07-07 DIAGNOSIS — Z9114 Patient's other noncompliance with medication regimen: Secondary | ICD-10-CM | POA: Diagnosis present

## 2014-07-07 DIAGNOSIS — L97409 Non-pressure chronic ulcer of unspecified heel and midfoot with unspecified severity: Secondary | ICD-10-CM | POA: Diagnosis present

## 2014-07-07 DIAGNOSIS — L97509 Non-pressure chronic ulcer of other part of unspecified foot with unspecified severity: Secondary | ICD-10-CM

## 2014-07-07 DIAGNOSIS — G629 Polyneuropathy, unspecified: Secondary | ICD-10-CM

## 2014-07-07 DIAGNOSIS — M86171 Other acute osteomyelitis, right ankle and foot: Secondary | ICD-10-CM

## 2014-07-07 DIAGNOSIS — M86179 Other acute osteomyelitis, unspecified ankle and foot: Secondary | ICD-10-CM

## 2014-07-07 DIAGNOSIS — E780 Pure hypercholesterolemia: Secondary | ICD-10-CM | POA: Diagnosis present

## 2014-07-07 HISTORY — DX: Type 2 diabetes mellitus with foot ulcer: E11.621

## 2014-07-07 HISTORY — DX: Tobacco use: Z72.0

## 2014-07-07 HISTORY — DX: Cellulitis of right lower limb: L03.115

## 2014-07-07 HISTORY — DX: Non-pressure chronic ulcer of other part of unspecified foot with unspecified severity: L97.509

## 2014-07-07 HISTORY — DX: Myiasis, unspecified: B87.9

## 2014-07-07 LAB — BASIC METABOLIC PANEL
Anion gap: 10 (ref 5–15)
BUN: 12 mg/dL (ref 6–20)
CO2: 27 mmol/L (ref 22–32)
Calcium: 8.5 mg/dL — ABNORMAL LOW (ref 8.9–10.3)
Chloride: 103 mmol/L (ref 101–111)
Creatinine, Ser: 0.96 mg/dL (ref 0.61–1.24)
GFR calc Af Amer: >60 mL/min (ref >60)
GFR calc non Af Amer: >60 mL/min (ref >60)
Glucose, Bld: 247 mg/dL — ABNORMAL HIGH (ref 65–99)
Potassium: 4 mmol/L (ref 3.5–5.1)
Sodium: 140 mmol/L (ref 135–145)

## 2014-07-07 LAB — CBC WITH DIFFERENTIAL/PLATELET
Basophils Absolute: 0 10*3/uL (ref 0.0–0.1)
Basophils Relative: 1 % (ref 0–1)
EOS ABS: 0.3 10*3/uL (ref 0.0–0.7)
Eosinophils Relative: 4 % (ref 0–5)
HCT: 46 % (ref 39.0–52.0)
HEMOGLOBIN: 15 g/dL (ref 13.0–17.0)
Lymphocytes Relative: 25 % (ref 12–46)
Lymphs Abs: 2.1 10*3/uL (ref 0.7–4.0)
MCH: 28.9 pg (ref 26.0–34.0)
MCHC: 32.6 g/dL (ref 30.0–36.0)
MCV: 88.6 fL (ref 78.0–100.0)
MONOS PCT: 8 % (ref 3–12)
Monocytes Absolute: 0.7 10*3/uL (ref 0.1–1.0)
NEUTROS ABS: 5.1 10*3/uL (ref 1.7–7.7)
NEUTROS PCT: 62 % (ref 43–77)
PLATELETS: 248 10*3/uL (ref 150–400)
RBC: 5.19 MIL/uL (ref 4.22–5.81)
RDW: 13.4 % (ref 11.5–15.5)
WBC: 8.1 10*3/uL (ref 4.0–10.5)

## 2014-07-07 LAB — GLUCOSE, CAPILLARY
GLUCOSE-CAPILLARY: 178 mg/dL — AB (ref 65–99)
Glucose-Capillary: 162 mg/dL — ABNORMAL HIGH (ref 65–99)
Glucose-Capillary: 164 mg/dL — ABNORMAL HIGH (ref 65–99)

## 2014-07-07 MED ORDER — DAKINS (1/4 STRENGTH) 0.125 % EX SOLN
Freq: Two times a day (BID) | CUTANEOUS | Status: DC
  Filled 2014-07-07: qty 473

## 2014-07-07 MED ORDER — DOCUSATE SODIUM 100 MG PO CAPS
100.0000 mg | ORAL_CAPSULE | Freq: Two times a day (BID) | ORAL | Status: DC
  Administered 2014-07-07 – 2014-07-09 (×5): 100 mg via ORAL
  Filled 2014-07-07 (×5): qty 1

## 2014-07-07 MED ORDER — ACETAMINOPHEN 325 MG PO TABS: 650.0000 mg | ORAL_TABLET | Freq: Four times a day (QID) | ORAL | Status: DC | PRN

## 2014-07-07 MED ORDER — FUROSEMIDE 20 MG PO TABS
20.0000 mg | ORAL_TABLET | Freq: Every day | ORAL | Status: DC
  Administered 2014-07-07 – 2014-07-09 (×3): 20 mg via ORAL
  Filled 2014-07-07 (×3): qty 1

## 2014-07-07 MED ORDER — ASPIRIN EC 81 MG PO TBEC
81.0000 mg | DELAYED_RELEASE_TABLET | Freq: Every day | ORAL | Status: DC
  Administered 2014-07-07 – 2014-07-09 (×3): 81 mg via ORAL
  Filled 2014-07-07 (×3): qty 1

## 2014-07-07 MED ORDER — ONDANSETRON HCL 4 MG PO TABS: 4.0000 mg | ORAL_TABLET | Freq: Four times a day (QID) | ORAL | Status: DC | PRN

## 2014-07-07 MED ORDER — HYDROMORPHONE HCL 1 MG/ML IJ SOLN
0.5000 mg | INTRAMUSCULAR | Status: DC | PRN
  Administered 2014-07-07 – 2014-07-08 (×3): 0.5 mg via INTRAVENOUS
  Filled 2014-07-07 (×3): qty 1

## 2014-07-07 MED ORDER — INSULIN ASPART 100 UNIT/ML ~~LOC~~ SOLN: 0.0000 [IU] | Freq: Every day | SUBCUTANEOUS | Status: DC

## 2014-07-07 MED ORDER — ACETAMINOPHEN 650 MG RE SUPP: 650.0000 mg | Freq: Four times a day (QID) | RECTAL | Status: DC | PRN

## 2014-07-07 MED ORDER — CANAGLIFLOZIN 300 MG PO TABS
300.0000 mg | ORAL_TABLET | Freq: Every day | ORAL | Status: DC
  Filled 2014-07-07 (×2): qty 300

## 2014-07-07 MED ORDER — VANCOMYCIN HCL IN DEXTROSE 1-5 GM/200ML-% IV SOLN
1000.0000 mg | Freq: Two times a day (BID) | INTRAVENOUS | Status: DC
  Administered 2014-07-07 – 2014-07-09 (×4): 1000 mg via INTRAVENOUS
  Filled 2014-07-07 (×10): qty 200

## 2014-07-07 MED ORDER — IBUPROFEN 800 MG PO TABS
800.0000 mg | ORAL_TABLET | Freq: Once | ORAL | Status: AC
  Administered 2014-07-07: 800 mg via ORAL

## 2014-07-07 MED ORDER — NICOTINE 21 MG/24HR TD PT24
21.0000 mg | MEDICATED_PATCH | Freq: Every day | TRANSDERMAL | Status: DC
  Administered 2014-07-07 – 2014-07-09 (×3): 21 mg via TRANSDERMAL
  Filled 2014-07-07 (×3): qty 1

## 2014-07-07 MED ORDER — BISACODYL 10 MG RE SUPP: 10.0000 mg | Freq: Every day | RECTAL | Status: DC | PRN

## 2014-07-07 MED ORDER — SODIUM CHLORIDE 0.9 % IV SOLN
INTRAVENOUS | Status: AC
  Administered 2014-07-07: 17:00:00 via INTRAVENOUS

## 2014-07-07 MED ORDER — ENOXAPARIN SODIUM 40 MG/0.4ML ~~LOC~~ SOLN
40.0000 mg | SUBCUTANEOUS | Status: DC
  Administered 2014-07-07 – 2014-07-09 (×3): 40 mg via SUBCUTANEOUS
  Filled 2014-07-07 (×3): qty 0.4

## 2014-07-07 MED ORDER — IBUPROFEN 800 MG PO TABS
ORAL_TABLET | ORAL | Status: AC
  Filled 2014-07-07: qty 1

## 2014-07-07 MED ORDER — DAKINS (1/4 STRENGTH) 0.125 % EX SOLN
Freq: Two times a day (BID) | CUTANEOUS | Status: DC
  Administered 2014-07-07 – 2014-07-09 (×4)

## 2014-07-07 MED ORDER — ALUM & MAG HYDROXIDE-SIMETH 200-200-20 MG/5ML PO SUSP: 30.0000 mL | Freq: Four times a day (QID) | ORAL | Status: DC | PRN

## 2014-07-07 MED ORDER — POLYETHYLENE GLYCOL 3350 17 G PO PACK: 17.0000 g | PACK | Freq: Every day | ORAL | Status: DC | PRN

## 2014-07-07 MED ORDER — GABAPENTIN 300 MG PO CAPS
300.0000 mg | ORAL_CAPSULE | Freq: Three times a day (TID) | ORAL | Status: DC
  Administered 2014-07-07 – 2014-07-09 (×6): 300 mg via ORAL
  Filled 2014-07-07 (×6): qty 1

## 2014-07-07 MED ORDER — HYDROCODONE-ACETAMINOPHEN 5-325 MG PO TABS
1.0000 | ORAL_TABLET | ORAL | Status: DC | PRN
  Administered 2014-07-07 – 2014-07-08 (×4): 2 via ORAL
  Filled 2014-07-07 (×5): qty 2

## 2014-07-07 MED ORDER — LEVOFLOXACIN IN D5W 750 MG/150ML IV SOLN
750.0000 mg | INTRAVENOUS | Status: DC
  Administered 2014-07-07 – 2014-07-08 (×2): 750 mg via INTRAVENOUS
  Filled 2014-07-07 (×2): qty 150

## 2014-07-07 MED ORDER — ONDANSETRON HCL 4 MG/2ML IJ SOLN: 4.0000 mg | Freq: Four times a day (QID) | INTRAMUSCULAR | Status: DC | PRN

## 2014-07-07 MED ORDER — INSULIN GLARGINE 100 UNIT/ML ~~LOC~~ SOLN
40.0000 [IU] | Freq: Every day | SUBCUTANEOUS | Status: DC
Start: 2014-07-07 — End: 2014-07-09
  Administered 2014-07-07 – 2014-07-08 (×2): 40 [IU] via SUBCUTANEOUS
  Filled 2014-07-07 (×5): qty 0.4

## 2014-07-07 MED ORDER — TRAZODONE HCL 50 MG PO TABS
25.0000 mg | ORAL_TABLET | Freq: Every evening | ORAL | Status: DC | PRN
Start: 2014-07-07 — End: 2014-07-09
  Filled 2014-07-07: qty 1

## 2014-07-07 MED ORDER — CANAGLIFLOZIN 100 MG PO TABS
300.0000 mg | ORAL_TABLET | Freq: Every day | ORAL | Status: DC
  Administered 2014-07-08 – 2014-07-09 (×2): 300 mg via ORAL
  Filled 2014-07-07 (×5): qty 3

## 2014-07-07 MED ORDER — INSULIN ASPART 100 UNIT/ML ~~LOC~~ SOLN
0.0000 [IU] | Freq: Three times a day (TID) | SUBCUTANEOUS | Status: DC
  Administered 2014-07-07: 3 [IU] via SUBCUTANEOUS
  Administered 2014-07-08: 2 [IU] via SUBCUTANEOUS

## 2014-07-07 MED ORDER — METFORMIN HCL 500 MG PO TABS
1000.0000 mg | ORAL_TABLET | Freq: Two times a day (BID) | ORAL | Status: DC
  Administered 2014-07-07 – 2014-07-08 (×2): 1000 mg via ORAL
  Filled 2014-07-07 (×2): qty 2

## 2014-07-07 MED ORDER — PRAVASTATIN SODIUM 10 MG PO TABS
10.0000 mg | ORAL_TABLET | Freq: Every day | ORAL | Status: DC
  Administered 2014-07-07 – 2014-07-08 (×2): 10 mg via ORAL
  Filled 2014-07-07 (×2): qty 1

## 2014-07-07 MED ORDER — VANCOMYCIN HCL IN DEXTROSE 1-5 GM/200ML-% IV SOLN
1000.0000 mg | Freq: Once | INTRAVENOUS | Status: AC
  Administered 2014-07-07: 1000 mg via INTRAVENOUS
  Filled 2014-07-07: qty 200

## 2014-07-07 NOTE — Progress Notes (Signed)
ANTIBIOTIC CONSULT NOTE - INITIAL  Pharmacy Consult for Vancomycin and Levaquin Indication: cellulitis / wound infection  Allergies  Allergen Reactions  . Penicillins Itching and Rash    Patient Measurements: Height: 5\' 11"  (180.3 cm) Weight: 226 lb (102.513 kg) IBW/kg (Calculated) : 75.3  Vital Signs: Temp: 97.6 F (36.4 C) (06/22 1119) Temp Source: Oral (06/22 1119) BP: 147/81 mmHg (06/22 1119) Pulse Rate: 61 (06/22 1119) Intake/Output from previous day:   Intake/Output from this shift:    Labs:  Recent Labs  07/07/14 0931  WBC 8.1  HGB 15.0  PLT 248  CREATININE 0.96   Estimated Creatinine Clearance: 94.8 mL/min (by C-G formula based on Cr of 0.96). No results for input(s): VANCOTROUGH, VANCOPEAK, VANCORANDOM, GENTTROUGH, GENTPEAK, GENTRANDOM, TOBRATROUGH, TOBRAPEAK, TOBRARND, AMIKACINPEAK, AMIKACINTROU, AMIKACIN in the last 72 hours.   Microbiology: No results found for this or any previous visit (from the past 720 hour(s)).  Medical History: Past Medical History  Diagnosis Date  . Diabetes mellitus without complication   . Arthritis   . Hypercholesterolemia   . Obesity 05/09/2014  . Maggot infestation     right foot ulcer  . Diabetic foot ulcer     right plantar  . Cellulitis     right leg   Anti-infectives    Start     Dose/Rate Route Frequency Ordered Stop   07/07/14 2000  vancomycin (VANCOCIN) IVPB 1000 mg/200 mL premix     1,000 mg 200 mL/hr over 60 Minutes Intravenous Every 12 hours 07/07/14 1250     07/07/14 1400  levofloxacin (LEVAQUIN) IVPB 750 mg     750 mg 100 mL/hr over 90 Minutes Intravenous Every 24 hours 07/07/14 1248     07/07/14 1015  vancomycin (VANCOCIN) IVPB 1000 mg/200 mL premix     1,000 mg 200 mL/hr over 60 Minutes Intravenous  Once 07/07/14 1003 07/07/14 1154      Assessment: 64yo male with h/o OBESITY, DM, diabetic foot ulcer, noncompliance and cellulitis to LE.  Pt presents to ED with cellulitis of right foot with open  ulcer with maggots, x-ray concerning for osteomyelitis.  Normalized ClCr 75-80  Goal of Therapy:  Vancomycin trough level 15-20 mcg/ml  Plan:  Levaquin 750mg  IV q24hrs Vancomycin 1000mg  IV q12hrs (since pt was not adequately loaded will start next dose sooner) Check vancomycin trough at steady state Monitor labs, renal fxn, and c/s  Valrie Hart A 07/07/2014,12:50 PM

## 2014-07-07 NOTE — ED Notes (Signed)
Redness and edema noted to right foot and ankle.  Petechiae noted to top of foot and lower leg.  unstageable to top and ball of foot.  Maggots noted to wound.

## 2014-07-07 NOTE — ED Notes (Signed)
Pt states that he has chronic right foot ulcer and this morning had maggots in the wound.

## 2014-07-07 NOTE — H&P (Signed)
[facility] History and Physical  Mitchell [name] ZOX:[id number] DOB: [birth year] DOA: 07/07/2014  Referring physician: Rubin Payor in ED PCP: Kirstie Peri, MD   Chief Complaint: wound infection  HPI: Mitchell [name] is a 64 y.o. male with history of diabetes, uncontrolled, noncompliant with medications, obesity, diabetic foot ulcer, cellulitis lower extremity presents to the emergency Department chief complaint wound infection and maggot infestation. Initial evaluation in the emergency department reveals likely cellulitis of the right foot, open ulcer right plantar foot with maggots and x-ray concerning for osteomyelitis.  Patient states he has had a wound on his right foot for several months and his wife typically does dressing changes. He reports his wife has been out-of-town he's been trying to take care of it for himself. He noted this morning maggots coming out of the open ulcer. Associated symptoms include worsening erythema and swelling of the right foot as well as worsening pain. He denies fever chills nausea vomiting diarrhea. He denies chest pain shortness of breath headache dizziness syncope or near-syncope.  Workup in the emergency department includes complete blood count is unremarkable metabolic panel significant for calcium 8.5 and glucose of 247. X-ray of the right foot concerning for osteomyelitis. In the emergency department he is afebrile hemodynamically stable and not hypoxic. He is provided with depression and vancomycin.  Review of Systems:  10 point review of systems complete and all systems are negative except as indicated in the history of present illness  Past Medical History  Diagnosis Date  . Diabetes mellitus without complication   . Arthritis   . Hypercholesterolemia   . Obesity 05/09/2014  . Maggot infestation     right foot ulcer   Past Surgical History  Procedure Laterality Date  . Appendectomy     Social History:  reports that he has been smoking Cigarettes.   He has been smoking about 1.50 packs per day. He does not have any smokeless tobacco history on file. He reports that he drinks alcohol. He reports that he does not use illicit drugs. He lives at home with his wife he says independent with ADLs continues to smoke occasional alcohol  Allergies  Allergen Reactions  . Penicillins Itching and Rash    History reviewed. No pertinent family history. family medical history includes diabetes heart disease  Prior to Admission medications   Medication Sig Start Date End Date Taking? Authorizing Provider  aspirin EC 81 MG tablet Take 81 mg by mouth daily.   Yes Historical Provider, MD  canagliflozin (INVOKANA) 300 MG TABS tablet Take 300 mg by mouth daily before breakfast.   Yes Historical Provider, MD  furosemide (LASIX) 20 MG tablet Take 1 tablet (20 mg total) by mouth daily. 03/22/13  Yes Standley Brooking, MD  gabapentin (NEURONTIN) 300 MG capsule Take 1 capsule (300 mg total) by mouth 3 (three) times daily. 03/22/13  Yes Standley Brooking, MD  Insulin Glargine (LANTUS) 100 UNIT/ML Solostar Pen Inject 30 Units into the skin 2 (two) times daily. Patient taking differently: Inject 55-75 Units into the skin daily.  05/12/14  Yes Elliot Cousin, MD  lovastatin (MEVACOR) 20 MG tablet Take 1 tablet (20 mg total) by mouth at bedtime. 03/22/13  Yes Standley Brooking, MD  metFORMIN (GLUCOPHAGE) 500 MG tablet Take 1,000 mg by mouth 2 (two) times daily. 05/26/14  Yes Historical Provider, MD   Physical Exam: Filed Vitals:   07/07/14 0908  BP: 151/70  Pulse: 86  Temp: 97.5 F (36.4 C)  TempSrc: Oral  Resp: 18  Height:  (1.803 m)  Weight: 102.513 kg (226 lb)  SpO2: 100%    Wt Readings from Last 3 Encounters:  07/07/14 102.513 kg (226 lb)  05/09/14 103.874 kg (229 lb)  03/22/13 101.833 kg (224 lb 8 oz)    General:  Appears calm and comfortable, obese Eyes: PERRL, normal lids, irises & conjunctiva ENT: grossly normal hearing, lips & tongue, mucous  membranes of his mouth are moist and pink  Neck: no LAD, masses or thyromegaly Cardiovascular: RRR, no m/r/g. Trace lower extremity edema   Respiratory: CTA bilaterally sounds somewhat distant, no w/r/r. Normal respiratory effort. Abdomen: soft, ntnd, positive bowel sounds obese no guarding  Skin: Right lower extremity with erythema on foot up to mid shin. Multiple abrasions on top of right foot. 2 open wounds plantar surface of right foot with maggot infestation wound bed red peripheral tissue white no odor  Musculoskeletal: grossly normal tone BUE/BLE Psychiatric: grossly normal mood and affect, speech fluent and appropriate Neurologic: grossly non-focal.          Labs on Admission:  Basic Metabolic Panel:  Recent Labs Lab 07/07/14 0931  NA 140  K 4.0  CL 103  CO2 27  GLUCOSE 247*  BUN 12  CREATININE 0.96  CALCIUM 8.5*   Liver Function Tests: No results for input(s): AST, ALT, ALKPHOS, BILITOT, PROT, ALBUMIN in the last 168 hours. No results for input(s): LIPASE, AMYLASE in the last 168 hours. No results for input(s): AMMONIA in the last 168 hours. CBC:  Recent Labs Lab 07/07/14 0931  WBC 8.1  NEUTROABS 5.1  HGB 15.0  HCT 46.0  MCV 88.6  PLT 248   Cardiac Enzymes: No results for input(s): CKTOTAL, CKMB, CKMBINDEX, TROPONINI in the last 168 hours.  BNP (last 3 results) No results for input(s): BNP in the last 8760 hours.  ProBNP (last 3 results) No results for input(s): PROBNP in the last 8760 hours.  CBG: No results for input(s): GLUCAP in the last 168 hours.  Radiological Exams on Admission: Dg Foot Complete Right  07/07/2014   CLINICAL DATA:  Subsequent encounter for chronic right foot ulcer. Diabetes.  EXAM: RIGHT FOOT COMPLETE - 3+ VIEW  COMPARISON:  05/09/2014.  FINDINGS: No fracture. No subluxation or dislocation. Soft tissue ulcer is noted in the palmar aspect of the lateral foot, at the level of the fifth MTP joint. There is a a small area of bony  erosion involving the head of the fifth metatarsal, concerning for infection.  IMPRESSION: Small area of cortical erosion in the head of the fifth metatarsal, concerning for osteomyelitis.   Electronically Signed   By: Kennith Center M.D.   On: 07/07/2014 09:53    EKG:   Assessment/Plan Principal Problem:   Foot ulcer/ Infestation, maggots: Foot ulcer nonhealing for several months in spite of dressing changes per patient report. He reports compliance with medications and dressing changes. Presents with maggot infestation and x-ray concerning for osteopenia. Will admit to medical floor. Will continue vancomycin and Levaquin. Will request podiatry consult and wound care consult. 8 beats surgical debridement Supportive therapy in the form of pain medicine and anti emanic as needed.  Active Problems: Cellulitis of right lower extremity: Particularly dorsal foot up to the ankle. With swelling. Vancomycin and Levaquin as noted above. Concern for underlying osteomyelitis. Await podiatry recommendations.    IDDM (insulin dependent diabetes mellitus): History of noncompliance. Will obtain a hemoglobin A1c. We'll continue his long-acting insulin at a slightly lower dose as his  appetite is unreliable. Continue oral agents. Monitor and adjust as indicated.      Obesity: BMI 31.6. Nutritional consult    Peripheral neuropathy: She complains of excruciating pain in that right foot. Continue his Neurontin     Tobacco use: Cessation counseling offered    Podiatry  Code Status: full DVT Prophylaxis: Family Communication: patient only Disposition Plan: home when ready  Time spent: 65 minutes  [facility] Pager [phone]

## 2014-07-07 NOTE — Consult Note (Addendum)
WOC has reviewed chart and xray report concerning for osteomyelitis, will need MRI for definitive diagnosis.   Will await podiatry consult, treatment of osteomyelitis is outside of the scope of practice of the WOC nurse.  Notified the bedside nurse that wound care will not evaluate the patient at this time.  I will follow up later today to evaluate the notes from podiatry.   Gayl Ivanoff Eliberto Ivory RN,CWOCN [phone]  Addendum: [phone] Podietry for debridement and has written dressing change orders. WOC will sign off M. Tanazia Achee RN,CWOCN

## 2014-07-07 NOTE — ED Provider Notes (Signed)
CSN: [id number]     Arrival date & time 07/07/14  6644 History   First MD Initiated Contact with Patient 07/07/14 [phone]     Chief Complaint  Patient presents with  . Wound Infection     (Consider location/radiation/quality/duration/timing/severity/associated sxs/prior Treatment) HPI Comments: Pt comes in with c/o maggots to his right foot. Pt states that he has had the wound for the last couple of months but his morning he noticed maggots to the area. Pt states that he doesn't have pain in the area. He states that he normally has his wife to help him care for it but she was out of town last week so he was trying to take care of it by himself. Denies fever. States that he is not sure how his sugar is  The history is provided by the patient. No language interpreter was used.    Past Medical History  Diagnosis Date  . Diabetes mellitus without complication   . Arthritis   . Hypercholesterolemia   . Obesity 05/09/2014   Past Surgical History  Procedure Laterality Date  . Appendectomy     History reviewed. No pertinent family history. History  Substance Use Topics  . Smoking status: Current Every Day Smoker -- 1.50 packs/day    Types: Cigarettes  . Smokeless tobacco: Not on file  . Alcohol Use: Yes     Comment: occ-twice a week beer approx 2    Review of Systems  All other systems reviewed and are negative.     Allergies  Penicillins  Home Medications   Prior to Admission medications   Medication Sig Start Date End Date Taking? Authorizing Provider  aspirin 325 MG tablet Take 325 mg by mouth daily.    Historical Provider, MD  canagliflozin (INVOKANA) 300 MG TABS tablet Take 300 mg by mouth daily before breakfast.    Historical Provider, MD  doxycycline (VIBRA-TABS) 100 MG tablet Take 1 tablet (100 mg total) by mouth every 12 (twelve) hours. Antibiotic. Starting tomorrow, take 1 tablet 2 times daily for 5 days. 05/12/14   Elliot Cousin, MD  furosemide (LASIX) 20 MG tablet  Take 1 tablet (20 mg total) by mouth daily. 03/22/13   Standley Brooking, MD  gabapentin (NEURONTIN) 300 MG capsule Take 1 capsule (300 mg total) by mouth 3 (three) times daily. 03/22/13   Standley Brooking, MD  HYDROcodone-acetaminophen (NORCO/VICODIN) 5-325 MG per tablet Take 1 tablet by mouth every 4 (four) hours as needed for moderate pain. 05/12/14   Elliot Cousin, MD  insulin aspart (NOVOLOG) 100 UNIT/ML FlexPen Inject 9 Units into the skin 3 (three) times daily with meals. 05/12/14   Elliot Cousin, MD  Insulin Glargine (LANTUS) 100 UNIT/ML Solostar Pen Inject 30 Units into the skin 2 (two) times daily. 05/12/14   Elliot Cousin, MD  lovastatin (MEVACOR) 20 MG tablet Take 1 tablet (20 mg total) by mouth at bedtime. 03/22/13   Standley Brooking, MD  mupirocin cream (BACTROBAN) 2 % Apply topically daily. 05/12/14   Elliot Cousin, MD   BP 151/70 mmHg  Pulse 86  Temp(Src) 97.5 F (36.4 C) (Oral)  Resp 18  Ht 5\' 11"  (1.803 m)  Wt 226 lb (102.513 kg)  BMI 31.53 kg/m2  SpO2 100% Physical Exam  Constitutional: He is oriented to person, place, and time. He appears well-developed and well-nourished.  HENT:  Head: Normocephalic and atraumatic.  Cardiovascular: Normal rate and regular rhythm.   Pulmonary/Chest: Effort normal and breath sounds normal.  Musculoskeletal: Normal  range of motion.  Neurological: He is alert and oriented to person, place, and time.  Skin:  Pt has multiple abrasion to the top of the right foot. Redness noted to the top of the foot without drainage noted. Pt has 2 wounds to the bottom of the right foot that have maggots growing out of the area. One is to the sole of his foot the other is below the fifth metatarsal  Nursing note and vitals reviewed.   ED Course  Procedures (including critical care time) Labs Review Labs Reviewed  BASIC METABOLIC PANEL - Abnormal; Notable for the following:    Glucose, Bld 247 (*)    Calcium 8.5 (*)    All other components within normal  limits  CBC WITH DIFFERENTIAL/PLATELET    Imaging Review Dg Foot Complete Right  07/07/2014   CLINICAL DATA:  Subsequent encounter for chronic right foot ulcer. Diabetes.  EXAM: RIGHT FOOT COMPLETE - 3+ VIEW  COMPARISON:  05/09/2014.  FINDINGS: No fracture. No subluxation or dislocation. Soft tissue ulcer is noted in the palmar aspect of the lateral foot, at the level of the fifth MTP joint. There is a a small area of bony erosion involving the head of the fifth metatarsal, concerning for infection.  IMPRESSION: Small area of cortical erosion in the head of the fifth metatarsal, concerning for osteomyelitis.   Electronically Signed   By: Kennith Center M.D.   On: 07/07/2014 09:53     EKG Interpretation None      MDM   Final diagnoses:  Ulcer of foot due to secondary diabetes  Maggot infestation    Pt to admitted for further evaluation of the foot. Noted the x-ray suggesting osteo although pt is afebrile and doesn't have a white count. Pt started on vancomycin    Teressa Lower, NP 07/07/14 1102  Raeford Razor, MD 07/08/14 [phone]

## 2014-07-07 NOTE — Consult Note (Signed)
Podiatry Consult Note  Reason for Consultation:  Right foot infected diabetic wounds  History of Present Illness: Mitchell [name] is a 64 y.o. male who presented to emergency department today because he saw maggots in his socks and he has chronic wounds to his right foot. Patient states he has had right foot wounds for the past 3 months and has not had follow-up or treatment. Patient's wife has been applying triple anti-biotic ointment and gauze every other day. Patient states he washes the wounds with soap and water. Patient relates pain and drainage however denies pus. Patient states Dr. Excell Seltzer worked on his foot and caused his wounds. Patient denies nausea, vomiting, fever, or chills.   Past Medical History  Diagnosis Date  . Diabetes mellitus without complication   . Arthritis   . Hypercholesterolemia   . Obesity 05/09/2014  . Maggot infestation     right foot ulcer  . Diabetic foot ulcer     right plantar  . Cellulitis     right leg   Scheduled Meds: . aspirin EC  81 mg Oral Daily  . [START ON 07/08/2014] canagliflozin  300 mg Oral QAC breakfast  . docusate sodium  100 mg Oral BID  . enoxaparin (LOVENOX) injection  40 mg Subcutaneous Q24H  . furosemide  20 mg Oral Daily  . gabapentin  300 mg Oral TID  . insulin aspart  0-15 Units Subcutaneous TID WC  . insulin aspart  0-5 Units Subcutaneous QHS  . insulin glargine  40 Units Subcutaneous QHS  . levofloxacin (LEVAQUIN) IV  750 mg Intravenous Q24H  . metFORMIN  1,000 mg Oral BID WC  . nicotine  21 mg Transdermal Daily  . pravastatin  10 mg Oral q1800  . sodium hypochlorite   Irrigation BID  . vancomycin  1,000 mg Intravenous Q12H   Continuous Infusions: . sodium chloride     PRN Meds:.acetaminophen **OR** acetaminophen, alum & mag hydroxide-simeth, bisacodyl, HYDROcodone-acetaminophen, HYDROmorphone (DILAUDID) injection, ondansetron **OR** ondansetron (ZOFRAN) IV, polyethylene glycol, traZODone  Allergies  Allergen Reactions  .  Penicillins Itching and Rash   Past Surgical History  Procedure Laterality Date  . Appendectomy     History reviewed. No pertinent family history. Social History:  reports that he has been smoking Cigarettes.  He has been smoking about 1.50 packs per day. He does not have any smokeless tobacco history on file. He reports that he drinks alcohol. He reports that he does not use illicit drugs.  Review of Systems: Patient denies nausea, vomiting, fever, or chills. Right foot with multiple wounds.  Physical Examination: Vital signs in last 24 hours:   Temp:  [97.5 F (36.4 C)-97.6 F (36.4 C)] 97.6 F (36.4 C) (06/22 1119) Pulse Rate:  [61-86] 61 (06/22 1119) Resp:  [18] 18 (06/22 1119) BP: (147-151)/(70-81) 147/81 mmHg (06/22 1119) SpO2:  [97 %-100 %] 97 % (06/22 1119) Weight:  [102.513 kg (226 lb)] 102.513 kg (226 lb) (06/22 0908)  Vasc: Right DP, PT 2/4 and left DP is 2/4, PT is nonpalpable. Bilateral lower extremity with hyper pigmentation, skin atrophy, and pitting edema. Derm: Right foot with edema and increased warmth. Right foot erythema extends proximally to mid right lower extremity. Ulceration noted to dorsal central forefoot with skin desquamation and fibrotic wound bed. Serous drainage noted without purulence expressed. Pain upon palpation. Ulceration noted to right submetatarsal head 5 with hyperkeratotic, macerated, and fibrotic tissue. Serous drainage noted without purulence expressed. Pain upon palpation. Ulceration noted to right plantar central lateral  forefoot with fibrotic tissue. Ulceration to plantar forefoot and midfoot with skin desquamation and fibrotic tissue. Serous drainage noted without purulence expressed. Pain upon palpation. Malodor noted. No maggots present. Dog hair noted in wounds. Neuro: Epicritic sensation is intact. Ortho: Muscle management testing 5/5.  Lab/Test Results:   Recent Labs  07/07/14 0931  WBC 8.1  HGB 15.0  HCT 46.0  PLT 248  NA  140  K 4.0  CL 103  CO2 27  BUN 12  CREATININE 0.96  GLUCOSE 247*  CALCIUM 8.5*    No results found for this or any previous visit (from the past 240 hour(s)).   Dg Foot Complete Right  07/07/2014   CLINICAL DATA:  Subsequent encounter for chronic right foot ulcer. Diabetes.  EXAM: RIGHT FOOT COMPLETE - 3+ VIEW  COMPARISON:  05/09/2014.  FINDINGS: No fracture. No subluxation or dislocation. Soft tissue ulcer is noted in the palmar aspect of the lateral foot, at the level of the fifth MTP joint. There is a a small area of bony erosion involving the head of the fifth metatarsal, concerning for infection.  IMPRESSION: Small area of cortical erosion in the head of the fifth metatarsal, concerning for osteomyelitis.   Electronically Signed   By: Kennith Center M.D.   On: 07/07/2014 09:53    Assessment: 1. Multiple ulcerations to right foot 2. IDDM, uncontrolled 3. Right foot cellulitis  Plan: 1. Consent obtained for debridement of right foot ulcerations. Patient had pain upon palpation therefore IV Dilaudid administered however patient was only able to tolerate minimal debridement. Excisional debridement of hyperkeratotic, macerated, fibrotic, and nonviable tissue utilizing a sterile #10 to subcutaneous layer with healthy, viable tissue. No tunneling or undermining noted. Wound does not probe to bone. Dorsal forefoot ulcer measures 5.5 x 4 x 0.1 cm. Right submetatarsal head 5 ulcer measures 0.5 x 0.7 x 0.2 cm. Central lateral plantar forefoot ulceration measures 1.2 x 1.3 x 0.3 cm. Plantar forefoot and midfoot ulceration measures 7.2 x 5 x 0.1 cm. 2. Wound culture obtained of right plantar foot ulceration. 3. Dakin solution ordered to be applied to right foot ulcerations twice a day. 4. Right surgical shoe ordered. Patient is to remain nonweightbearing to the right foot.  5. Await MRI. 6. Continue vancomycin and Levaquin.  Thank you for consultation. Will follow patient with you.  Laurell Josephs 07/07/2014, 3:21 PM

## 2014-07-08 DIAGNOSIS — Z720 Tobacco use: Secondary | ICD-10-CM

## 2014-07-08 DIAGNOSIS — E119 Type 2 diabetes mellitus without complications: Secondary | ICD-10-CM

## 2014-07-08 DIAGNOSIS — Z794 Long term (current) use of insulin: Secondary | ICD-10-CM

## 2014-07-08 DIAGNOSIS — M609 Myositis, unspecified: Secondary | ICD-10-CM | POA: Diagnosis present

## 2014-07-08 LAB — COMPREHENSIVE METABOLIC PANEL
ALK PHOS: 78 U/L (ref 38–126)
ALT: 10 U/L — AB (ref 17–63)
AST: 10 U/L — AB (ref 15–41)
Albumin: 3 g/dL — ABNORMAL LOW (ref 3.5–5.0)
Anion gap: 7 (ref 5–15)
BILIRUBIN TOTAL: 0.7 mg/dL (ref 0.3–1.2)
BUN: 10 mg/dL (ref 6–20)
CO2: 30 mmol/L (ref 22–32)
CREATININE: 0.9 mg/dL (ref 0.61–1.24)
Calcium: 8.2 mg/dL — ABNORMAL LOW (ref 8.9–10.3)
Chloride: 101 mmol/L (ref 101–111)
GFR calc Af Amer: >60 mL/min (ref >60)
Glucose, Bld: 139 mg/dL — ABNORMAL HIGH (ref 65–99)
Potassium: 4.2 mmol/L (ref 3.5–5.1)
Sodium: 138 mmol/L (ref 135–145)
Total Protein: 6.4 g/dL — ABNORMAL LOW (ref 6.5–8.1)

## 2014-07-08 LAB — CBC
HCT: 43.2 % (ref 39.0–52.0)
Hemoglobin: 13.8 g/dL (ref 13.0–17.0)
MCH: 28.5 pg (ref 26.0–34.0)
MCHC: 31.9 g/dL (ref 30.0–36.0)
MCV: 89.1 fL (ref 78.0–100.0)
Platelets: 238 10*3/uL (ref 150–400)
RBC: 4.85 MIL/uL (ref 4.22–5.81)
RDW: 13.6 % (ref 11.5–15.5)
WBC: 7.4 10*3/uL (ref 4.0–10.5)

## 2014-07-08 LAB — GLUCOSE, CAPILLARY
GLUCOSE-CAPILLARY: 142 mg/dL — AB (ref 65–99)
Glucose-Capillary: 124 mg/dL — ABNORMAL HIGH (ref 65–99)
Glucose-Capillary: 114 mg/dL — ABNORMAL HIGH (ref 65–99)
Glucose-Capillary: 138 mg/dL — ABNORMAL HIGH (ref 65–99)

## 2014-07-08 LAB — HEMOGLOBIN A1C
Hgb A1c MFr Bld: 9.9 % — ABNORMAL HIGH (ref 4.8–5.6)
MEAN PLASMA GLUCOSE: 237 mg/dL

## 2014-07-08 MED ORDER — METFORMIN HCL 500 MG PO TABS
500.0000 mg | ORAL_TABLET | Freq: Two times a day (BID) | ORAL | Status: DC
  Administered 2014-07-08 – 2014-07-09 (×2): 500 mg via ORAL
  Filled 2014-07-08 (×2): qty 1

## 2014-07-08 MED ORDER — INSULIN ASPART 100 UNIT/ML ~~LOC~~ SOLN
0.0000 [IU] | Freq: Three times a day (TID) | SUBCUTANEOUS | Status: DC
  Administered 2014-07-08: 2 [IU] via SUBCUTANEOUS
  Administered 2014-07-09: 1 [IU] via SUBCUTANEOUS

## 2014-07-08 MED ORDER — INSULIN ASPART 100 UNIT/ML ~~LOC~~ SOLN: 0.0000 [IU] | Freq: Every day | SUBCUTANEOUS | Status: DC

## 2014-07-08 NOTE — Progress Notes (Signed)
[facility] PROGRESS NOTE  Mitchell [name] XNT:[id number] DOB: September 23, 1950 DOA: 07/07/2014 PCP: Kirstie Peri, MD    Code Status: Full code Family Communication: Discussed with patient; family not available Disposition Plan: Discharge when clinically appropriate   Consultants:  Podiatry, Dr. Reynolds Bowl  Procedures:  07/07/14: Excisional debridement of hyperkeratotic, macerated, fibrotic, and nonviable tissue right plantar surface.  Antibiotics:  Vancomycin 6/22>  Levaquin 6/22>  HPI/Subjective: Patient complains of right foot pain, but opiate pain medications have been helping. He denies nausea, vomiting, subjective fever or chills.  Objective: Filed Vitals:   07/08/14 0642  BP: 121/78  Pulse: 71  Temp: 98.5 F (36.9 C)  Resp: 18    Intake/Output Summary (Last 24 hours) at 07/08/14 0904 Last data filed at 07/08/14 0646  Gross per 24 hour  Intake 1846.67 ml  Output   2500 ml  Net -653.33 ml   Filed Weights   07/07/14 0908 07/08/14 0642  Weight: 102.513 kg (226 lb) 104 kg (229 lb 4.5 oz)    Exam:   General:  Pleasant 64 year old man in no acute distress.  Cardiovascular: S1, S2, with a soft systolic murmur.  Respiratory: Decreased breath sounds in the bases, otherwise clear.  Abdomen: Positive bowel sounds, soft, nontender, nondistended.  Musculoskeletal/extremities: Right foot with bandage in place, not removed until reevaluation by Dr. Reynolds Bowl. (Of note, Dr. Preston Fleeting assessment and description of the right plantar ulcers are described in Dr. Preston Fleeting note-greatly appreciated). Right leg below the knee with mild diffuse erythema, decreased from yesterday-with mild tenderness; Mild trace to 1+ right lower extremity edema in the pretibial area; no edema or erythema left lower extremity. Pedal pulses palpable bilaterally.  Data Reviewed: Basic Metabolic Panel:  Recent Labs Lab 07/07/14 0931 07/08/14 0623  NA 140 138  K 4.0 4.2  CL 103 101  CO2 27 30  GLUCOSE 247* 139*   BUN 12 10  CREATININE 0.96 0.90  CALCIUM 8.5* 8.2*   Liver Function Tests:  Recent Labs Lab 07/08/14 0623  AST 10*  ALT 10*  ALKPHOS 78  BILITOT 0.7  PROT 6.4*  ALBUMIN 3.0*   No results for input(s): LIPASE, AMYLASE in the last 168 hours. No results for input(s): AMMONIA in the last 168 hours. CBC:  Recent Labs Lab 07/07/14 0931 07/08/14 0623  WBC 8.1 7.4  NEUTROABS 5.1  --   HGB 15.0 13.8  HCT 46.0 43.2  MCV 88.6 89.1  PLT 248 238   Cardiac Enzymes: No results for input(s): CKTOTAL, CKMB, CKMBINDEX, TROPONINI in the last 168 hours. BNP (last 3 results) No results for input(s): BNP in the last 8760 hours.  ProBNP (last 3 results) No results for input(s): PROBNP in the last 8760 hours.  CBG:  Recent Labs Lab 07/07/14 1312 07/07/14 1620 07/07/14 2103  GLUCAP 178* 162* 164*    Recent Results (from the past 240 hour(s))  Wound culture     Status: None (Preliminary result)   Collection Time: 07/07/14  3:00 PM  Result Value Ref Range Status   Specimen Description WOUND RIGHT FOOT PLANTAR ASPECT  Final   Special Requests NONE  Final   Gram Stain   Final    RARE WBC PRESENT, PREDOMINANTLY PMN NO SQUAMOUS EPITHELIAL CELLS SEEN RARE GRAM NEGATIVE RODS Performed at [facility]    Culture PENDING  Incomplete   Report Status PENDING  Incomplete     Studies: Mr Foot Right Wo Contrast  07/07/2014   CLINICAL DATA:  Open wound overlying the fifth metatarsal  head.  EXAM: MRI OF THE RIGHT FOREFOOT WITHOUT CONTRAST  TECHNIQUE: Multiplanar, multisequence MR imaging was performed. No intravenous contrast was administered.  COMPARISON:  Radiographs 07/07/2014  FINDINGS: Examination is very limited due to patient motion.  There is diffuse subcutaneous soft tissue swelling/ edema suggesting cellulitis. There is also diffuse myositis. No obvious discrete fluid collection to suggest a drainable abscess.  I do not see any obvious findings for septic arthritis or  osteomyelitis. The abnormality described in the fifth metatarsal head appears stable since the prior study from April 2016 and I do not see any definite STIR signal abnormality to suggest this is osteomyelitis.  IMPRESSION: Very limited examination but no obvious findings for septic arthritis or osteomyelitis.  Diffuse cellulitis and myositis without discrete drainable abscess.   Electronically Signed   By: Rudie Meyer M.D.   On: 07/07/2014 16:22   Dg Foot Complete Right  07/07/2014   CLINICAL DATA:  Subsequent encounter for chronic right foot ulcer. Diabetes.  EXAM: RIGHT FOOT COMPLETE - 3+ VIEW  COMPARISON:  05/09/2014.  FINDINGS: No fracture. No subluxation or dislocation. Soft tissue ulcer is noted in the palmar aspect of the lateral foot, at the level of the fifth MTP joint. There is a a small area of bony erosion involving the head of the fifth metatarsal, concerning for infection.  IMPRESSION: Small area of cortical erosion in the head of the fifth metatarsal, concerning for osteomyelitis.   Electronically Signed   By: Kennith Center M.D.   On: 07/07/2014 09:53    Scheduled Meds: . aspirin EC  81 mg Oral Daily  . canagliflozin  300 mg Oral QAC breakfast  . docusate sodium  100 mg Oral BID  . enoxaparin (LOVENOX) injection  40 mg Subcutaneous Q24H  . furosemide  20 mg Oral Daily  . gabapentin  300 mg Oral TID  . insulin aspart  0-15 Units Subcutaneous TID WC  . insulin aspart  0-5 Units Subcutaneous QHS  . insulin glargine  40 Units Subcutaneous QHS  . levofloxacin (LEVAQUIN) IV  750 mg Intravenous Q24H  . metFORMIN  1,000 mg Oral BID WC  . nicotine  21 mg Transdermal Daily  . pravastatin  10 mg Oral q1800  . sodium hypochlorite   Irrigation BID  . 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 Diabetic foot ulcers on the right plantar surface with associated right lower extremity cellulitis/myositis. The patient was noted to have maggots in the right plantar ulcers on admission. He was afebrile and his white blood cell count was within normal limits, but he had extensive cellulitis and multiple right plantar ulcerations necessitating IV antibiotic. There was a suggestion of right fifth toe osteomyelitis on the x-ray. MRI of his right foot was ordered for evaluation and revealed no evidence of osteomyelitis, but it did show extensive cellulitis and myositis. -Podiatrist, Dr. Adine Madura was consulted. She performed an excisional debridement of the hyperkeratotic/macerated/fibrotic tissue of the right plantar ulcers on 6/22. Wound culture sent and the results are pending. -Vancomycin and Levaquin were started on admission and will be continued for now.  Insulin  requiring diabetes mellitus with peripheral neuropathy. The patient is treated with Lantus, metformin, and Invokana and gabapentin in the outpatient setting. These have been restarted with an addition of sliding scale NovoLog. His CBGs have been reasonable over the past 24 hours, less than 200. His A1c is 9.9, improved from 14.2 one month ago. We'll decrease the dosing of metformin and sliding scale NovoLog to decrease the risk of hypoglycemia in the hospital setting.  Tobacco abuse. The patient was advised to stop smoking. Will continued nicotine patch as requested.   Time spent: 35 minutes    [facility]  [facility] Pager [phone]. If 7PM-7AM, please contact night-coverage at [email or web address], password [facility] 07/08/2014, 9:04 AM  LOS: 1 day

## 2014-07-08 NOTE — Progress Notes (Signed)
Dr. Laurell Josephs in see patient dressing removed from right foot. MD to change dressing to right. Patient medicated for pain before MD dressed right foot and excision of diabetic ulcer.

## 2014-07-08 NOTE — Progress Notes (Signed)
UR chart review completed.  

## 2014-07-08 NOTE — Care Management Note (Signed)
Case Management Note  Patient Details  Name: Dajon Badolato MRN: [id number] Date of Birth: 08/17/1950  Subjective/Objective:                  Pt admitted from home with diabetic foot wound. Pt lives with his wife and will return home at discharge. Pt is independent with ADL's. Pt does have a cane, walker, and crutches for home use.  Action/Plan: Pt will need HH RN at discharge for wound care. Pt chooses [facility] for home health needs. Alroy Bailiff of [facility] is aware and will collect the pts information from the chart. HH services to start within 48 hours of discharge. No DME needs noted. Will continue to follow for discharge planning needs.  Expected Discharge Date:  07/09/14               Expected Discharge Plan:  Home w Home Health Services  In-House Referral:  NA  Discharge planning Services  CM Consult  Post Acute Care Choice:  Home Health Choice offered to:  Patient  DME Arranged:    DME Agency:     HH Arranged:  RN HH Agency:  [facility]  Status of Service:  Completed, signed off  Medicare Important Message Given:    Date Medicare IM Given:    Medicare IM give by:    Date Additional Medicare IM Given:    Additional Medicare Important Message give by:     If discussed at Long Length of Stay Meetings, dates discussed:    Additional Comments:  Cheryl Flash, RN 07/08/2014, 11:09 AM

## 2014-07-08 NOTE — Progress Notes (Signed)
Podiatry Progress Note  History of Present Illness: Patient seen at bedside in no acute distress. He is accompanied by his brother Alfredo Bach. Patient states his right foot is still painful however the pain is improved. He denies nausea, vomiting, fever, or chills.   Past Medical History  Diagnosis Date  . Diabetes mellitus without complication   . Arthritis   . Hypercholesterolemia   . Obesity 05/09/2014  . Maggot infestation     right foot ulcer  . Diabetic foot ulcer     right plantar  . Cellulitis     right leg   Scheduled Meds: . aspirin EC  81 mg Oral Daily  . canagliflozin  300 mg Oral QAC breakfast  . docusate sodium  100 mg Oral BID  . enoxaparin (LOVENOX) injection  40 mg Subcutaneous Q24H  . furosemide  20 mg Oral Daily  . gabapentin  300 mg Oral TID  . insulin aspart  0-5 Units Subcutaneous QHS  . insulin aspart  0-9 Units Subcutaneous TID WC  . insulin glargine  40 Units Subcutaneous QHS  . levofloxacin (LEVAQUIN) IV  750 mg Intravenous Q24H  . metFORMIN  500 mg Oral BID WC  . nicotine  21 mg Transdermal Daily  . pravastatin  10 mg Oral q1800  . sodium hypochlorite   Irrigation BID  . vancomycin  1,000 mg Intravenous Q12H   Continuous Infusions:  PRN Meds:.acetaminophen **OR** acetaminophen, alum & mag hydroxide-simeth, bisacodyl, HYDROcodone-acetaminophen, HYDROmorphone (DILAUDID) injection, ondansetron **OR** ondansetron (ZOFRAN) IV, polyethylene glycol, traZODone  Allergies  Allergen Reactions  . Penicillins Itching and Rash   Past Surgical History  Procedure Laterality Date  . Appendectomy     History reviewed. No pertinent family history. Social History:  reports that he has been smoking Cigarettes.  He has been smoking about 1.50 packs per day. He does not have any smokeless tobacco history on file. He reports that he drinks alcohol. He reports that he does not use illicit drugs.   Physical Examination: Vital signs in last 24 hours:   Temp:  [98.5 F  (36.9 C)-99.1 F (37.3 C)] 98.5 F (36.9 C) (06/23 1610) Pulse Rate:  [62-71] 71 (06/23 0642) Resp:  [18-20] 18 (06/23 0642) BP: (121)/(62-78) 121/78 mmHg (06/23 0642) SpO2:  [94 %-95 %] 94 % (06/23 0642) Weight:  [104 kg (229 lb 4.5 oz)] 104 kg (229 lb 4.5 oz) (06/23 9604)  Vasc: Right DP, PT 2/4 and left DP is 2/4, PT is nonpalpable. Bilateral lower extremity with hyper pigmentation, skin atrophy, and pitting edema. Derm: Right foot with erythema and edema however improved since yesterday. Right foot erythema extends proximally to mid right lower extremity. Ulceration noted to dorsal central forefoot with fibrotic wound bed. Serous drainage noted without purulence expressed. Pain upon palpation. Ulceration noted to right submetatarsal head 5 with fibrotic tissue. Serous drainage noted without purulence expressed. No pain upon palpation. Ulceration noted to right plantar forefoot and midfoot with fibrotic tissue. Serous drainage noted without purulence expressed. Pain upon palpation. No malodor noted. No maggots present.  Neuro: Epicritic sensation is intact. Ortho: Muscle management testing 5/5.  Lab/Test Results:   Recent Labs  07/07/14 0931 07/08/14 0623  WBC 8.1 7.4  HGB 15.0 13.8  HCT 46.0 43.2  PLT 248 238  NA 140 138  K 4.0 4.2  CL 103 101  CO2 27 30  BUN 12 10  CREATININE 0.96 0.90  GLUCOSE 247* 139*  CALCIUM 8.5* 8.2*    Recent Results (from  the past 240 hour(s))  Wound culture     Status: None (Preliminary result)   Collection Time: 07/07/14  3:00 PM  Result Value Ref Range Status   Specimen Description WOUND RIGHT FOOT PLANTAR ASPECT  Final   Special Requests NONE  Final   Gram Stain   Final    RARE WBC PRESENT, PREDOMINANTLY PMN NO SQUAMOUS EPITHELIAL CELLS SEEN RARE GRAM NEGATIVE RODS Performed at [facility]    Culture PENDING  Incomplete   Report Status PENDING  Incomplete     Mr Foot Right Wo Contrast  07/07/2014   CLINICAL DATA:  Open  wound overlying the fifth metatarsal head.  EXAM: MRI OF THE RIGHT FOREFOOT WITHOUT CONTRAST  TECHNIQUE: Multiplanar, multisequence MR imaging was performed. No intravenous contrast was administered.  COMPARISON:  Radiographs 07/07/2014  FINDINGS: Examination is very limited due to patient motion.  There is diffuse subcutaneous soft tissue swelling/ edema suggesting cellulitis. There is also diffuse myositis. No obvious discrete fluid collection to suggest a drainable abscess.  I do not see any obvious findings for septic arthritis or osteomyelitis. The abnormality described in the fifth metatarsal head appears stable since the prior study from April 2016 and I do not see any definite STIR signal abnormality to suggest this is osteomyelitis.  IMPRESSION: Very limited examination but no obvious findings for septic arthritis or osteomyelitis.  Diffuse cellulitis and myositis without discrete drainable abscess.   Electronically Signed   By: Rudie Meyer M.D.   On: 07/07/2014 16:22   Dg Foot Complete Right  07/07/2014   CLINICAL DATA:  Subsequent encounter for chronic right foot ulcer. Diabetes.  EXAM: RIGHT FOOT COMPLETE - 3+ VIEW  COMPARISON:  05/09/2014.  FINDINGS: No fracture. No subluxation or dislocation. Soft tissue ulcer is noted in the palmar aspect of the lateral foot, at the level of the fifth MTP joint. There is a a small area of bony erosion involving the head of the fifth metatarsal, concerning for infection.  IMPRESSION: Small area of cortical erosion in the head of the fifth metatarsal, concerning for osteomyelitis.   Electronically Signed   By: Kennith Center M.D.   On: 07/07/2014 09:53    Assessment: 1. Multiple ulcerations to right foot 2. IDDM, uncontrolled 3. Right foot cellulitis  Plan: 1. Consent obtained for debridement of right foot ulcerations. IV Dilaudid administered prior to debridement. Excisional debridement of fibrotic and nonviable tissue utilizing a sterile #10 to  subcutaneous layer with healthy, viable and bleeding tissue. No tunneling or undermining noted. Wound does not probe to bone. Dorsal forefoot ulcer measures 5.5 x 4 x 0.1 cm. Right submetatarsal head 5 ulcer measures 0.5 x 0.7 x 0.2 cm. Plantar forefoot and midfoot ulceration measures 7.3 x 5.2 x 0.3 cm. 2. Follow-up final wound culture. 3. Dakin solution soaked 4 x 4, dry 4 x 4, ABDs, Kerlix, and Ace applied to right dorsal foot, sub-metatarsal head 5, and plantar foot ulcerations. Nursing is to continue applying Dakin solution to right foot ulcerations twice a day. 4. Patient is to remain nonweightbearing to the right foot. Right surgical shoe is at bedside. Patient states he has a walker at home. Instructed patient to remain nonweightbearing to the right foot with the aid of a surgical shoe and walker. 5. Reviewed MRI results. No abscesses or osteomyelitis noted. 6. Continue vancomycin and Levaquin. 7. Discussed with case manager regarding outpatient wound care. [facility] will change dressing every other day and they will train patient's  wife on dressing changes. 8. Patient has follow-up appointment at [facility] on Monday, 07/12/2014 at 9:15 AM.  Laurell Josephs 07/08/2014, 1:20 PM

## 2014-07-09 ENCOUNTER — Encounter (HOSPITAL_COMMUNITY): Payer: Self-pay | Admitting: Internal Medicine

## 2014-07-09 LAB — GLUCOSE, CAPILLARY
GLUCOSE-CAPILLARY: 123 mg/dL — AB (ref 65–99)
Glucose-Capillary: 88 mg/dL (ref 65–99)

## 2014-07-09 MED ORDER — HYDROCODONE-ACETAMINOPHEN 5-325 MG PO TABS: 1.0000 | ORAL_TABLET | ORAL | Status: DC | PRN

## 2014-07-09 MED ORDER — LEVOFLOXACIN 750 MG PO TABS: 750.0000 mg | ORAL_TABLET | Freq: Every day | ORAL | Status: DC

## 2014-07-09 NOTE — Discharge Summary (Signed)
Physician Discharge Summary  Mitchell [name] GNF:[id number] DOB: 03/18/1950 DOA: 07/07/2014  PCP: Kirstie Peri, MD  Admit date: 07/07/2014 Discharge date: 07/09/2014  Time spent: Greater than 30 minutes  Recommendations for Outpatient Follow-up:  1. Home health registered nurse was ordered for wound care.  2. Wound culture results are still pending at discharge. 3. Wound care instructions per podiatrist, Dr. Reynolds Bowl: Lisette Grinder solution soaked 4 x 4, dry 4 x 4, ABDs, Kerlix, and Ace applied to right dorsal foot, sub-metatarsal head 5, and plantar foot ulcerations. Nursing is to continue applying Dakin solution to right foot ulcerations twice a day.  Patient is to remain nonweightbearing to the right foot. Right surgical shoe is at bedside. Patient states he has a walker at home. Instructed patient to remain nonweightbearing to the right foot with the aid of a surgical shoe and walker.   Discharge Diagnoses:  1. Multiple diabetic foot ulcers on the right plantar surface with associated right lower extremity cellulitis/myositis. -On admission, ulcers were infested with maggots. -Status post sharp bedside debridement of plantar ulcers per Dr. Reynolds Bowl. 2. 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 3. Tobacco abuse. The patient was advised to stop smoking. 4. Obesity.  Discharge Condition: Improved.  Diet recommendation: Carbohydrate modified/heart healthy.  Filed Weights   07/07/14 0908 07/08/14 0642 07/09/14 0701  Weight: 102.513 kg (226 lb) 104 kg (229 lb 4.5 oz) 104.1 kg (229 lb 8 oz)    History of present illness:  The patient is a 64 year old man with a history of chronic right plantar foot ulcerations, peripheral neuropathy, hospitalization 2 months ago for right lower extremity cellulitis, uncontrolled diabetes mellitus, and tobacco abuse, who was admitted for recurrent right lower extremity cellulitis and right plantar ulcer with maggots and a concern for right fifth toe  osteomyelitis per x-ray from the emergency department. On admission, he was afebrile and his white blood cell count was within normal limits.  Hospital Course:  1. Diabetic foot ulcers on the right plantar surface with associated right lower extremity cellulitis/myositis. The patient was noted to have maggots in the right plantar ulcers on admission. He was afebrile and his white blood cell count was within normal limits, but he had extensive cellulitis and multiple right plantar ulcerations necessitating IV antibiotics. There was a suggestion of right fifth toe osteomyelitis on the x-ray. MRI of his right foot was ordered for evaluation and revealed no evidence of osteomyelitis, but it did show extensive cellulitis and myositis. -Podiatrist, Dr. Adine Madura was consulted. She performed an excisional debridement of the hyperkeratotic/macerated/fibrotic tissue of the right plantar ulcers on 07/07/14 and additional sharp debridement of the ulcers on 07/08/14. Wound culture sent and the results are negative to date, but still pending. The patient was started on vancomycin and Levaquin empirically. He received 2-1/2 days of treatment. He will be discharged on 5 more days of oral Levaquin.  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 The patient is treated with Lantus, metformin, and Invokana and gabapentin in the outpatient setting. All were restarted with an addition of sliding scale NovoLog. His CBGs were reasonably controlled during the hospital course. His A1c was 9.9, improved from 14.2 one month ago.  Tobacco abuse. The patient was advised to stop smoking. Tobacco cessation counseling was ordered. He was given a nicotine patch.  Procedures:  07/08/2014-debridement of right foot ulcers, by Dr. Reynolds Bowl.  Consultations:  Podiatry, Dr. Reynolds Bowl  Discharge Exam: Filed Vitals:   07/09/14 0701  BP: 142/75  Pulse: 77  Temp: 98.1 F (  36.7 C)  Resp: 20    General: Pleasant 64 year old man in  no acute distress.  Cardiovascular: S1, S2, with a soft systolic murmur.  Respiratory: Decreased breath sounds in the bases, otherwise clear.  Abdomen: Positive bowel sounds, soft, nontender, nondistended.  Musculoskeletal/extremities: Right foot with bandage in place, not removed. (Of note, Dr. Preston Fleeting assessment and description of the right plantar ulcers are described in Dr. Preston Fleeting note-greatly appreciated). Right leg below the knee with near resolution of pretibial erythema; trace of pretibial right lower extremity edema; no edema or erythema left lower extremity. Pedal pulses palpable bilaterally.  Discharge Instructions   Discharge Instructions    Diet - low sodium heart healthy    Complete by:  As directed      Diet Carb Modified    Complete by:  As directed      Discharge instructions    Complete by:  As directed   Take medications as prescribed. Nonweightbearing on the right leg as prescribed by Dr. Reynolds Bowl. Wound care and dressing changes per Dr. Reynolds Bowl. Stop smoking.     Discharge wound care:    Complete by:  As directed   Dakin solution soaked 4 x 4, dry 4 x 4, ABDs, Kerlix, and Ace applied to right dorsal foot, sub-metatarsal head 5, and plantar foot ulcerations. Nursing is to continue applying Dakin solution to right foot ulcerations Patient is to remain nonweightbearing to the right foot. Right surgical shoe can be used when necessary. Instructed patient to remain nonweightbearing to the right foot with the aid of a surgical shoe and walker.     Increase activity slowly    Complete by:  As directed           Current Discharge Medication List    START taking these medications   Details  HYDROcodone-acetaminophen (NORCO/VICODIN) 5-325 MG per tablet Take 1-2 tablets by mouth every 4 (four) hours as needed for moderate pain. Qty: 30 tablet, Refills: 0    levofloxacin (LEVAQUIN) 750 MG tablet Take 1 tablet (750 mg total) by mouth daily. Antibiotic to be taken for 5 more days,  starting tomorrow. Qty: 5 tablet, Refills: 0      CONTINUE these medications which have NOT CHANGED   Details  aspirin EC 81 MG tablet Take 81 mg by mouth daily.    canagliflozin (INVOKANA) 300 MG TABS tablet Take 300 mg by mouth daily before breakfast.    furosemide (LASIX) 20 MG tablet Take 1 tablet (20 mg total) by mouth daily. Qty: 30 tablet, Refills: 0    gabapentin (NEURONTIN) 300 MG capsule Take 1 capsule (300 mg total) by mouth 3 (three) times daily. Qty: 90 capsule, Refills: 0    Insulin Glargine (LANTUS) 100 UNIT/ML Solostar Pen Inject 30 Units into the skin 2 (two) times daily.    lovastatin (MEVACOR) 20 MG tablet Take 1 tablet (20 mg total) by mouth at bedtime. Qty: 30 tablet, Refills: 0    metFORMIN (GLUCOPHAGE) 500 MG tablet Take 1,000 mg by mouth 2 (two) times daily.       Allergies  Allergen Reactions  . Penicillins Itching and Rash   Follow-up Information    Follow up with [facility].   Contact information:   [address] [place] [address] [phone]       Follow up with Laurell Josephs, DPM On 07/12/2014.   Specialty:  Podiatry   Why:  Monday 6/27 at 9:15am   Contact information:   [address] [place] [address] [phone]        The results of significant diagnostics from this hospitalization (including imaging, microbiology, ancillary and laboratory) are listed below for reference.    Significant Diagnostic Studies: Mr Foot Right Wo Contrast  07/07/2014   CLINICAL DATA:  Open wound overlying the fifth metatarsal head.  EXAM: MRI OF THE RIGHT FOREFOOT WITHOUT CONTRAST  TECHNIQUE: Multiplanar, multisequence MR imaging was performed. No intravenous contrast was administered.  COMPARISON:  Radiographs 07/07/2014  FINDINGS: Examination is very limited due to patient motion.  There is diffuse subcutaneous soft tissue swelling/ edema suggesting cellulitis. There is also diffuse myositis. No obvious discrete fluid  collection to suggest a drainable abscess.  I do not see any obvious findings for septic arthritis or osteomyelitis. The abnormality described in the fifth metatarsal head appears stable since the prior study from April 2016 and I do not see any definite STIR signal abnormality to suggest this is osteomyelitis.  IMPRESSION: Very limited examination but no obvious findings for septic arthritis or osteomyelitis.  Diffuse cellulitis and myositis without discrete drainable abscess.   Electronically Signed   By: Rudie Meyer M.D.   On: 07/07/2014 16:22   Dg Foot Complete Right  07/07/2014   CLINICAL DATA:  Subsequent encounter for chronic right foot ulcer. Diabetes.  EXAM: RIGHT FOOT COMPLETE - 3+ VIEW  COMPARISON:  05/09/2014.  FINDINGS: No fracture. No subluxation or dislocation. Soft tissue ulcer is noted in the palmar aspect of the lateral foot, at the level of the fifth MTP joint. There is a a small area of bony erosion involving the head of the fifth metatarsal, concerning for infection.  IMPRESSION: Small area of cortical erosion in the head of the fifth metatarsal, concerning for osteomyelitis.   Electronically Signed   By: Kennith Center M.D.   On: 07/07/2014 09:53    Microbiology: Recent Results (from the past 240 hour(s))  Wound culture     Status: None (Preliminary result)   Collection Time: 07/07/14  3:00 PM  Result Value Ref Range Status   Specimen Description WOUND RIGHT FOOT PLANTAR ASPECT  Final   Special Requests NONE  Final   Gram Stain   Final    RARE WBC PRESENT, PREDOMINANTLY PMN NO SQUAMOUS EPITHELIAL CELLS SEEN RARE GRAM NEGATIVE RODS Performed at [facility]    Culture   Final    Culture reincubated for better growth Performed at [facility]    Report Status PENDING  Incomplete     Labs: Basic Metabolic Panel:  Recent Labs Lab 07/07/14 0931 07/08/14 0623  NA 140 138  K 4.0 4.2  CL 103 101  CO2 27 30  GLUCOSE 247* 139*  BUN 12 10  CREATININE  0.96 0.90  CALCIUM 8.5* 8.2*   Liver Function Tests:  Recent Labs Lab 07/08/14 0623  AST 10*  ALT 10*  ALKPHOS 78  BILITOT 0.7  PROT 6.4*  ALBUMIN 3.0*   No results for input(s): LIPASE, AMYLASE in the last 168 hours. No results for input(s): AMMONIA in the last 168 hours. CBC:  Recent Labs Lab 07/07/14 0931 07/08/14 0623  WBC 8.1 7.4  NEUTROABS 5.1  --   HGB 15.0 13.8  HCT 46.0 43.2  MCV 88.6 89.1  PLT 248 238   Cardiac Enzymes: No results for input(s): CKTOTAL, CKMB, CKMBINDEX, TROPONINI in the last 168 hours. BNP: BNP (last 3 results) No results for input(s): BNP in the last 8760 hours.  ProBNP (  last 3 results) No results for input(s): PROBNP in the last 8760 hours.  CBG:  Recent Labs Lab 07/08/14 0745 07/08/14 1133 07/08/14 1637 07/08/14 2056 07/09/14 0718  GLUCAP 142* 124* 114* 138* 88       Signed:  Reade Trefz  [facility] 07/09/2014, 11:19 AM

## 2014-07-09 NOTE — Progress Notes (Signed)
Patient with orders to be discharge home. Discharge instructions given, patient verbalized understanding. Prescriptions given. Patient stable. Patient left in private vehicle with family.  

## 2014-07-09 NOTE — Care Management Note (Signed)
Case Management Note  Patient Details  Name: Mitchell [name] MRN: [id number] Date of Birth: [birth year]  Subjective/Objective:                    Action/Plan:   Expected Discharge Date:  07/09/14               Expected Discharge Plan:  Home w Home Health Services  In-House Referral:  NA  Discharge planning Services  CM Consult  Post Acute Care Choice:  Home Health Choice offered to:  Patient  DME Arranged:    DME Agency:     HH Arranged:  RN HH Agency:  [facility]  Status of Service:  Completed, signed off  Medicare Important Message Given:  N/A - LOS <3 / Initial given by admissions Date Medicare IM Given:    Medicare IM give by:    Date Additional Medicare IM Given:    Additional Medicare Important Message give by:     If discussed at Long Length of Stay Meetings, dates discussed:    Additional Comments: Pt discharged home today with [facility] RN for wound care (per pts choice). Emma with [facility] is aware and will collect the pts information from the chart. HH services to start within 48 hours of discharge. No DME needs noted. Pt and pts nurse aware of discharge arrangements. Arlyss Queen [place], RN 07/09/2014, 11:52 AM

## 2014-07-11 LAB — WOUND CULTURE

## 2014-08-29 ENCOUNTER — Emergency Department (HOSPITAL_COMMUNITY)
Admission: EM | Admit: 2014-08-29 | Discharge: 2014-08-29 | Disposition: A | Discharge status: Home / Self Care | Payer: Medicare HMO | Attending: Emergency Medicine | Admitting: Emergency Medicine

## 2014-08-29 ENCOUNTER — Emergency Department (HOSPITAL_COMMUNITY): Payer: Medicare HMO

## 2014-08-29 ENCOUNTER — Encounter (HOSPITAL_COMMUNITY): Payer: Self-pay | Admitting: *Deleted

## 2014-08-29 DIAGNOSIS — E669 Obesity, unspecified: Secondary | ICD-10-CM | POA: Diagnosis not present

## 2014-08-29 DIAGNOSIS — Y998 Other external cause status: Secondary | ICD-10-CM | POA: Insufficient documentation

## 2014-08-29 DIAGNOSIS — W231XXA Caught, crushed, jammed, or pinched between stationary objects, initial encounter: Secondary | ICD-10-CM | POA: Diagnosis not present

## 2014-08-29 DIAGNOSIS — Y9301 Activity, walking, marching and hiking: Secondary | ICD-10-CM | POA: Insufficient documentation

## 2014-08-29 DIAGNOSIS — Z794 Long term (current) use of insulin: Secondary | ICD-10-CM | POA: Diagnosis not present

## 2014-08-29 DIAGNOSIS — Z872 Personal history of diseases of the skin and subcutaneous tissue: Secondary | ICD-10-CM | POA: Insufficient documentation

## 2014-08-29 DIAGNOSIS — Z23 Encounter for immunization: Secondary | ICD-10-CM | POA: Insufficient documentation

## 2014-08-29 DIAGNOSIS — S91111A Laceration without foreign body of right great toe without damage to nail, initial encounter: Secondary | ICD-10-CM | POA: Diagnosis present

## 2014-08-29 DIAGNOSIS — Z7982 Long term (current) use of aspirin: Secondary | ICD-10-CM | POA: Diagnosis not present

## 2014-08-29 DIAGNOSIS — Z720 Tobacco use: Secondary | ICD-10-CM | POA: Insufficient documentation

## 2014-08-29 DIAGNOSIS — Y9289 Other specified places as the place of occurrence of the external cause: Secondary | ICD-10-CM | POA: Diagnosis not present

## 2014-08-29 DIAGNOSIS — Z880 Allergy status to penicillin: Secondary | ICD-10-CM | POA: Diagnosis not present

## 2014-08-29 DIAGNOSIS — M1990 Unspecified osteoarthritis, unspecified site: Secondary | ICD-10-CM | POA: Diagnosis not present

## 2014-08-29 DIAGNOSIS — Z79899 Other long term (current) drug therapy: Secondary | ICD-10-CM | POA: Insufficient documentation

## 2014-08-29 DIAGNOSIS — E780 Pure hypercholesterolemia: Secondary | ICD-10-CM | POA: Diagnosis not present

## 2014-08-29 DIAGNOSIS — E1140 Type 2 diabetes mellitus with diabetic neuropathy, unspecified: Secondary | ICD-10-CM | POA: Insufficient documentation

## 2014-08-29 DIAGNOSIS — S91119A Laceration without foreign body of unspecified toe without damage to nail, initial encounter: Secondary | ICD-10-CM

## 2014-08-29 DIAGNOSIS — E119 Type 2 diabetes mellitus without complications: Secondary | ICD-10-CM | POA: Diagnosis not present

## 2014-08-29 LAB — CBC
HEMATOCRIT: 46.6 % (ref 39.0–52.0)
HEMOGLOBIN: 15.5 g/dL (ref 13.0–17.0)
MCH: 28.8 pg (ref 26.0–34.0)
MCHC: 33.3 g/dL (ref 30.0–36.0)
MCV: 86.6 fL (ref 78.0–100.0)
Platelets: 193 10*3/uL (ref 150–400)
RBC: 5.38 MIL/uL (ref 4.22–5.81)
RDW: 13.9 % (ref 11.5–15.5)
WBC: 8.7 10*3/uL (ref 4.0–10.5)

## 2014-08-29 LAB — BASIC METABOLIC PANEL
ANION GAP: 10 (ref 5–15)
BUN: 15 mg/dL (ref 6–20)
CALCIUM: 8.6 mg/dL — AB (ref 8.9–10.3)
CO2: 28 mmol/L (ref 22–32)
Chloride: 102 mmol/L (ref 101–111)
Creatinine, Ser: 0.97 mg/dL (ref 0.61–1.24)
GFR calc Af Amer: >60 mL/min (ref >60)
GFR calc non Af Amer: >60 mL/min (ref >60)
GLUCOSE: 167 mg/dL — AB (ref 65–99)
Potassium: 3.9 mmol/L (ref 3.5–5.1)
Sodium: 140 mmol/L (ref 135–145)

## 2014-08-29 MED ORDER — CEFAZOLIN SODIUM 1-5 GM-% IV SOLN
1.0000 g | Freq: Once | INTRAVENOUS | Status: AC
  Administered 2014-08-29: 1 g via INTRAVENOUS
  Filled 2014-08-29: qty 50

## 2014-08-29 MED ORDER — HYDROCODONE-ACETAMINOPHEN 5-325 MG PO TABS: 1.0000 | ORAL_TABLET | ORAL | Status: DC | PRN

## 2014-08-29 MED ORDER — CEPHALEXIN 500 MG PO CAPS: 500.0000 mg | ORAL_CAPSULE | Freq: Three times a day (TID) | ORAL | Status: DC

## 2014-08-29 MED ORDER — TETANUS-DIPHTH-ACELL PERTUSSIS 5-2.5-18.5 LF-MCG/0.5 IM SUSP
0.5000 mL | Freq: Once | INTRAMUSCULAR | Status: AC
  Administered 2014-08-29: 0.5 mL via INTRAMUSCULAR
  Filled 2014-08-29: qty 0.5

## 2014-08-29 MED ORDER — LIDOCAINE HCL (PF) 1 % IJ SOLN
5.0000 mL | Freq: Once | INTRAMUSCULAR | Status: AC
  Administered 2014-08-29: 5 mL via INTRADERMAL
  Filled 2014-08-29: qty 5

## 2014-08-29 NOTE — ED Provider Notes (Signed)
CSN: [id number]     Arrival date & time 08/29/14  1532 History   First MD Initiated Contact with Patient 08/29/14 1610     Chief Complaint  Patient presents with  . Extremity Laceration     (Consider location/radiation/quality/duration/timing/severity/associated sxs/prior Treatment) HPI Comments: 64 year old male with diabetes, obesity, peripheral neuropathy, tobacco abuse presents with right great toe laceration and bleeding since prior to arrival. Patient is walking up basement steps and caught his foot. No other significant injuries. Patient is on aspirin no other blood thinners. Discomfort with walking.  The history is provided by the patient.    Past Medical History  Diagnosis Date  . Diabetes mellitus without complication   . Arthritis   . Hypercholesterolemia   . Obesity 05/09/2014  . Maggot infestation     right foot ulcer  . Diabetic foot ulcer 07/07/2014    Status post bedside debridement of multiple right plantar ulcers by podiatrist, Dr. Reynolds Bowl.  . Cellulitis of right lower extremity 05/09/2014  . Tobacco abuse 07/07/2014   Past Surgical History  Procedure Laterality Date  . Appendectomy     History reviewed. No pertinent family history. Social History  Substance Use Topics  . Smoking status: Current Every Day Smoker -- 1.50 packs/day    Types: Cigarettes  . Smokeless tobacco: None  . Alcohol Use: Yes     Comment: occ-twice a week beer approx 2    Review of Systems  Constitutional: Negative for fever and chills.  HENT: Negative for congestion.   Eyes: Negative for visual disturbance.  Respiratory: Negative for shortness of breath.   Cardiovascular: Negative for chest pain.  Gastrointestinal: Negative for vomiting and abdominal pain.  Genitourinary: Negative for dysuria and flank pain.  Musculoskeletal: Negative for back pain, neck pain and neck stiffness.  Skin: Positive for wound. Negative for rash.  Neurological: Negative for light-headedness and headaches.       Allergies  Penicillins  Home Medications   Prior to Admission medications   Medication Sig Start Date End Date Taking? Authorizing Provider  aspirin EC 81 MG tablet Take 81 mg by mouth daily.   Yes Historical Provider, MD  furosemide (LASIX) 20 MG tablet Take 1 tablet (20 mg total) by mouth daily. 03/22/13  Yes Standley Brooking, MD  gabapentin (NEURONTIN) 300 MG capsule Take 1 capsule (300 mg total) by mouth 3 (three) times daily. 03/22/13  Yes Standley Brooking, MD  HYDROcodone-acetaminophen (NORCO/VICODIN) 5-325 MG per tablet Take 1-2 tablets by mouth every 4 (four) hours as needed for moderate pain. 07/09/14  Yes Elliot Cousin, MD  insulin aspart (NOVOLOG) 100 UNIT/ML injection Inject 20-25 Units into the skin 2 (two) times daily.   Yes Historical Provider, MD  Insulin Glargine (LANTUS) 100 UNIT/ML Solostar Pen Inject 30 Units into the skin 2 (two) times daily. Patient taking differently: Inject 40 Units into the skin daily at 10 pm.  05/12/14  Yes Elliot Cousin, MD  lovastatin (MEVACOR) 20 MG tablet Take 1 tablet (20 mg total) by mouth at bedtime. 03/22/13  Yes Standley Brooking, MD  metFORMIN (GLUCOPHAGE) 500 MG tablet Take 1,000 mg by mouth 2 (two) times daily. 05/26/14  Yes Historical Provider, MD  cephALEXin (KEFLEX) 500 MG capsule Take 1 capsule (500 mg total) by mouth 3 (three) times daily. 08/29/14   Blane Ohara, MD  HYDROcodone-acetaminophen (NORCO) 5-325 MG per tablet Take 1 tablet by mouth every 4 (four) hours as needed. 08/29/14   Blane Ohara, MD  levofloxacin (LEVAQUIN) 750  MG tablet Take 1 tablet (750 mg total) by mouth daily. Antibiotic to be taken for 5 more days, starting tomorrow. 07/09/14   Elliot Cousin, MD   BP 112/60 mmHg  Pulse 70  Temp(Src) 98.2 F (36.8 C) (Oral)  Resp 18  Ht 5\' 11"  (1.803 m)  Wt 224 lb (101.606 kg)  BMI 31.26 kg/m2  SpO2 97% Physical Exam  Constitutional: He is oriented to person, place, and time. He appears well-developed and  well-nourished.  HENT:  Head: Normocephalic and atraumatic.  Eyes: Right eye exhibits no discharge. Left eye exhibits no discharge.  Neck: Normal range of motion. Neck supple. No tracheal deviation present.  Cardiovascular: Normal rate and regular rhythm.   Pulmonary/Chest: Effort normal and breath sounds normal.  Abdominal: Soft. He exhibits no distension. There is no tenderness. There is no guarding.  Musculoskeletal: He exhibits edema (mild edema bilateral lower extremities).  Neurological: He is alert and oriented to person, place, and time.  Skin: Skin is warm.  Patient has significant laceration through lateral and dorsal part of proximal nailbed and right great toe with mild bleeding persistent, gaping with flexion of the toe, chronic neuropathy.  Psychiatric: He has a normal mood and affect.  Nursing note and vitals reviewed.   ED Course  Procedures (including critical care time) LACERATION REPAIR Performed by: Enid Skeens Authorized by: Enid Skeens Consent: Verbal consent obtained. Risks and benefits: risks, benefits and alternatives were discussed Consent given by: patient Patient identity confirmed: provided demographic data Prepped and Draped in normal sterile fashion Wound explored  Laceration Location: right great toe Laceration Length: 3cm No Foreign Bodies seen or palpated Anesthesia: local infiltration Local anesthetic: lidocaine 1% no epinephrine Anesthetic total: 5 ml Toe block Amount of cleaning: standard  Complex, significant gaping/ partial amputation Skin closure: approximated Number of sutures: 4 3-0 ethilon  Technique: interupted  Patient tolerance: Patient tolerated the procedure well with no immediate complications.   Labs Review Labs Reviewed  BASIC METABOLIC PANEL - Abnormal; Notable for the following:    Glucose, Bld 167 (*)    Calcium 8.6 (*)    All other components within normal limits  CBC    Imaging Review Dg Foot  Complete Right  08/29/2014   CLINICAL DATA:  64 year old male with blunt trauma and laceration to the right great toe today. Diabetic. Initial encounter.  EXAM: RIGHT FOOT COMPLETE - 3+ VIEW  COMPARISON:  Right foot series 40981.  FINDINGS: Dressing material about the distal right foot. A chronic subluxation of the right fifth toe. Chronic degenerative appearing erosions about the head of the fifth metatarsal. No acute fracture or dislocation identified. No suspicious cortical osteolysis. Calcaneus intact.  IMPRESSION: No acute fracture or dislocation identified about the right foot. Degenerative changes at the fifth MTP.   Electronically Signed   By: Odessa Fleming M.D.   On: 08/29/2014 17:03   I, Eh Sauseda M, personally reviewed and evaluated these images and lab results as part of my medical decision-making.   EKG Interpretation None      MDM   Final diagnoses:  Laceration of toe, right, complicated, initial encounter  Type 2 diabetes mellitus with diabetic neuropathy   Patient presents with significant laceration to great toe, more severe than expected due to neuropathy history. Plan for x-rays, wound care, basic blood work in case patient require surgery.  X-ray results reviewed an x-ray reviewed by myself no acute fracture. Complicated toe laceration/partial amputation repaired. With patient being diabetic and  cigarette smoker patient's high risk for infection and poor healing. Stressed follow-up with primary doctor this week. Antibodies.  Results and differential diagnosis were discussed with the patient/parent/guardian. Xrays were independently reviewed by myself.  Close follow up outpatient was discussed, comfortable with the plan.   Medications  lidocaine (PF) (XYLOCAINE) 1 % injection 5 mL (not administered)  Tdap (BOOSTRIX) injection 0.5 mL (0.5 mLs Intramuscular Given 08/29/14 1704)  ceFAZolin (ANCEF) IVPB 1 g/50 mL premix (0 g Intravenous Stopped 08/29/14 1741)    Filed  Vitals:   08/29/14 1558  BP: 112/60  Pulse: 70  Temp: 98.2 F (36.8 C)  TempSrc: Oral  Resp: 18  Height:  (1.803 m)  Weight: 224 lb (101.606 kg)  SpO2: 97%    Final diagnoses:  Laceration of toe, right, complicated, initial encounter  Type 2 diabetes mellitus with diabetic neuropathy       Blane Ohara, MD 08/29/14 1805

## 2014-08-29 NOTE — Discharge Instructions (Signed)
Keep wound clean. Follow-up closely for recheck as this is high risk for infection. Avoid tobacco use. Stitches out in approx 10 days If you were given medicines take as directed.  If you are on coumadin or contraceptives realize their levels and effectiveness is altered by many different medicines.  If you have any reaction (rash, tongues swelling, other) to the medicines stop taking and see a physician.    If your blood pressure was elevated in the ER make sure you follow up for management with a primary doctor or return for chest pain, shortness of breath or stroke symptoms.  Please follow up as directed and return to the ER or see a physician for new or worsening symptoms.  Thank you. Filed Vitals:   08/29/14 1558  BP: 112/60  Pulse: 70  Temp: 98.2 F (36.8 C)  TempSrc: Oral  Resp: 18  Height:  (1.803 m)  Weight: 224 lb (101.606 kg)  SpO2: 97%

## 2014-08-29 NOTE — ED Notes (Signed)
Patient hit foot on step coming out of basement, laceration noted to right great toe.

## 2015-07-08 ENCOUNTER — Encounter: Payer: Self-pay | Admitting: Surgery

## 2015-07-15 ENCOUNTER — Encounter: Payer: Self-pay | Admitting: Surgery

## 2015-07-15 ENCOUNTER — Other Ambulatory Visit: Payer: Self-pay

## 2015-07-15 ENCOUNTER — Ambulatory Visit (INDEPENDENT_AMBULATORY_CARE_PROVIDER_SITE_OTHER): Payer: Medicare HMO | Admitting: Surgery

## 2015-07-15 VITALS — BP 127/66 | HR 98 | Temp 99.5°F | Resp 16 | Ht 71.5 in | Wt 248.0 lb

## 2015-07-15 DIAGNOSIS — I7025 Atherosclerosis of native arteries of other extremities with ulceration: Secondary | ICD-10-CM

## 2015-07-15 NOTE — Progress Notes (Addendum)
[facility] [place]  Patient name: Mitchell [name] MRN: [id number] DOB: 1950/04/12 Sex: male  REFERRING PHYSICIAN: Dr. Tanda RockersNichols  REASON FOR CONSULT: Diabetic foot wound, right  HPI: Mitchell [name] is a 65 y.o. male, who is who is referred today for evaluation of a wound on the right foot.  This is been present for several months.  He has been on 6 weeks of IV antibiotics without significant improvement.  He is now getting daily packing and irrigations of the wound with iodoform.  He has undergone noninvasive vascular lab testing which shows normal ankle-brachial indices bilaterally.  The patient is diabetic.  His blood sugars range from 90-1 85.  He is a current smoker.  He is on a statin for hypercholesterolemia.  Past Medical History  Diagnosis Date  . Diabetes mellitus without complication (HCC)   . Arthritis   . Hypercholesterolemia   . Obesity 05/09/2014  . Maggot infestation     right foot ulcer  . Diabetic foot ulcer (HCC) 07/07/2014    Status post bedside debridement of multiple right plantar ulcers by podiatrist, Dr. Reynolds BowlLu.  . Cellulitis of right lower extremity 05/09/2014  . Tobacco abuse 07/07/2014    No family history on file.  SOCIAL HISTORY: Social History   Social History  . Marital Status: Married    Spouse Name: N/A  . Number of Children: N/A  . Years of Education: N/A   Occupational History  . Not on file.   Social History Main Topics  . Smoking status: Current Every Day Smoker -- 1.50 packs/day    Types: Cigarettes  . Smokeless tobacco: Never Used  . Alcohol Use: 0.0 oz/week    0 Standard drinks or equivalent per week     Comment: occ-twice a week beer approx 2  . Drug Use: No  . Sexual Activity: Not on file   Other Topics Concern  . Not on file   Social History Narrative    Allergies  Allergen Reactions  . Penicillins Itching and Rash    Current Outpatient Prescriptions  Medication Sig Dispense  Refill  . aspirin EC 81 MG tablet Take 81 mg by mouth daily.    . canagliflozin (INVOKANA) 300 MG TABS tablet Take 300 mg by mouth daily before breakfast.    . furosemide (LASIX) 20 MG tablet Take 1 tablet (20 mg total) by mouth daily. 30 tablet 0  . gabapentin (NEURONTIN) 300 MG capsule Take 1 capsule (300 mg total) by mouth 3 (three) times daily. 90 capsule 0  . lovastatin (MEVACOR) 20 MG tablet Take 1 tablet (20 mg total) by mouth at bedtime. 30 tablet 0  . metFORMIN (GLUCOPHAGE) 500 MG tablet Take 1,000 mg by mouth 2 (two) times daily.    . cephALEXin (KEFLEX) 500 MG capsule Take 1 capsule (500 mg total) by mouth 3 (three) times daily. (Patient not taking: Reported on 07/15/2015) 21 capsule 0  . HYDROcodone-acetaminophen (NORCO) 5-325 MG per tablet Take 1 tablet by mouth every 4 (four) hours as needed. (Patient not taking: Reported on 07/15/2015) 4 tablet 0  . HYDROcodone-acetaminophen (NORCO/VICODIN) 5-325 MG per tablet Take 1-2 tablets by mouth every 4 (four) hours as needed for moderate pain. (Patient not taking: Reported on 07/15/2015) 30 tablet 0  . insulin aspart (NOVOLOG) 100 UNIT/ML injection Inject 20-25 Units into the skin 2 (two) times daily. Reported on 07/15/2015    . Insulin Glargine (LANTUS) 100 UNIT/ML Solostar Pen Inject 30 Units into the skin 2 (two)  times daily. (Patient not taking: Reported on 07/15/2015)    . levofloxacin (LEVAQUIN) 750 MG tablet Take 1 tablet (750 mg total) by mouth daily. Antibiotic to be taken for 5 more days, starting tomorrow. (Patient not taking: Reported on 07/15/2015) 5 tablet 0   No current facility-administered medications for this visit.    REVIEW OF SYSTEMS:   denotes positive finding,  denotes negative finding Cardiac  Comments:  Chest pain or chest pressure:    Shortness of breath upon exertion:    Short of breath when lying flat:    Irregular heart rhythm:        Vascular    Pain in calf, thigh, or hip brought on by ambulation:      Pain in feet at night that wakes you up from your sleep:  x   Blood clot in your veins:    Leg swelling:  x       Pulmonary    Oxygen at home:    Productive cough:     Wheezing:         Neurologic    Sudden weakness in arms or legs:     Sudden numbness in arms or legs:     Sudden onset of difficulty speaking or slurred speech:    Temporary loss of vision in one eye:     Problems with dizziness:         Gastrointestinal    Blood in stool:     Vomited blood:         Genitourinary    Burning when urinating:     Blood in urine:        Psychiatric    Major depression:         Hematologic    Bleeding problems:    Problems with blood clotting too easily:        Skin    Rashes or ulcers: x       Constitutional    Fever or chills:      PHYSICAL EXAM: Filed Vitals:   07/15/15 1414  BP: 127/66  Pulse: 98  Temp: 99.5 F (37.5 C)  Resp: 16  Height: 5' 11.5" (1.816 m)  Weight: 248 lb (112.492 kg)  SpO2: 94%    GENERAL: The patient is a well-nourished male, in no acute distress. The vital signs are documented above. CARDIAC: There is a regular rate and rhythm.  VASCULAR: Edema to the right leg extending up onto the dorsum of the foot.  Pulses are not palpable. PULMONARY: There is good air exchange bilaterally without wheezing or rales. ABDOMEN: Soft and non-tender with normal pitched bowel sounds.  MUSCULOSKELETAL: There are no major deformities or cyanosis. NEUROLOGIC: No focal weakness or paresthesias are detected. SKIN: Small defect on the right metatarsal head #5 PSYCHIATRIC: The patient has a normal affect.  DATA:  I have reviewed his ankle-brachial indices.  On the right it measures 0.96 and on the left it measures 1.03.  His toe pressure on the right is 70  X-rays of the foot note osteo- in the right foot  MEDICAL ISSUES: Right foot infection and diabetic: I discussed with the patient I think he is a high risk for limb loss.  Despite normal ankle-brachial  indices, his toe pressures are slightly decreased.  Ideally, a diabetic would need a toe pressure of 80 for wound healing.  His pressure is 70.  Therefore I think he needs to undergo angiography to see if he has a lesion  which would be amenable to intervention. If he does, I would proceed with intervention at that time and an attempt to help with limb salvage.  I'll plan on cannulation of the left groin and imaging of the right leg.  I discussed the risks of the procedure and the details with the patient.  We will proceed on Thursday, July 6   Durene CalWells Brabham, MD [facility] Tel [phone] Pager [phone]

## 2015-07-22 ENCOUNTER — Encounter (HOSPITAL_COMMUNITY)
Admission: RE | Disposition: A | Discharge status: Home / Self Care | Payer: Self-pay | Source: Ambulatory Visit | Attending: Vascular Surgery

## 2015-07-22 ENCOUNTER — Ambulatory Visit (HOSPITAL_COMMUNITY)
Admission: RE | Admit: 2015-07-22 | Discharge: 2015-07-22 | Disposition: A | Discharge status: Home / Self Care | Payer: Medicare HMO | Source: Ambulatory Visit | Attending: Vascular Surgery | Admitting: Vascular Surgery

## 2015-07-22 ENCOUNTER — Encounter (HOSPITAL_COMMUNITY): Payer: Self-pay | Admitting: Vascular Surgery

## 2015-07-22 ENCOUNTER — Other Ambulatory Visit: Payer: Self-pay

## 2015-07-22 DIAGNOSIS — Z7982 Long term (current) use of aspirin: Secondary | ICD-10-CM | POA: Diagnosis not present

## 2015-07-22 DIAGNOSIS — L97511 Non-pressure chronic ulcer of other part of right foot limited to breakdown of skin: Secondary | ICD-10-CM | POA: Insufficient documentation

## 2015-07-22 DIAGNOSIS — Z880 Allergy status to penicillin: Secondary | ICD-10-CM | POA: Insufficient documentation

## 2015-07-22 DIAGNOSIS — E7800 Pure hypercholesterolemia, unspecified: Secondary | ICD-10-CM | POA: Insufficient documentation

## 2015-07-22 DIAGNOSIS — E11621 Type 2 diabetes mellitus with foot ulcer: Secondary | ICD-10-CM | POA: Diagnosis not present

## 2015-07-22 DIAGNOSIS — E669 Obesity, unspecified: Secondary | ICD-10-CM | POA: Diagnosis not present

## 2015-07-22 DIAGNOSIS — Z7984 Long term (current) use of oral hypoglycemic drugs: Secondary | ICD-10-CM | POA: Insufficient documentation

## 2015-07-22 DIAGNOSIS — I7025 Atherosclerosis of native arteries of other extremities with ulceration: Secondary | ICD-10-CM | POA: Diagnosis not present

## 2015-07-22 DIAGNOSIS — Z6834 Body mass index (BMI) 34.0-34.9, adult: Secondary | ICD-10-CM | POA: Insufficient documentation

## 2015-07-22 DIAGNOSIS — Z794 Long term (current) use of insulin: Secondary | ICD-10-CM | POA: Insufficient documentation

## 2015-07-22 DIAGNOSIS — M1990 Unspecified osteoarthritis, unspecified site: Secondary | ICD-10-CM | POA: Insufficient documentation

## 2015-07-22 DIAGNOSIS — F17210 Nicotine dependence, cigarettes, uncomplicated: Secondary | ICD-10-CM | POA: Diagnosis not present

## 2015-07-22 HISTORY — PX: PERIPHERAL VASCULAR CATHETERIZATION: SHX172C

## 2015-07-22 LAB — POCT I-STAT, CHEM 8
BUN: 10 mg/dL (ref 6–20)
CHLORIDE: 103 mmol/L (ref 101–111)
CREATININE: 0.7 mg/dL (ref 0.61–1.24)
Calcium, Ion: 1.12 mmol/L (ref 1.12–1.23)
Glucose, Bld: 159 mg/dL — ABNORMAL HIGH (ref 65–99)
HEMATOCRIT: 39 % (ref 39.0–52.0)
HEMOGLOBIN: 13.3 g/dL (ref 13.0–17.0)
POTASSIUM: 3.9 mmol/L (ref 3.5–5.1)
Sodium: 141 mmol/L (ref 135–145)
TCO2: 24 mmol/L (ref 0–100)

## 2015-07-22 LAB — GLUCOSE, CAPILLARY: GLUCOSE-CAPILLARY: 154 mg/dL — AB (ref 65–99)

## 2015-07-22 SURGERY — ABDOMINAL AORTOGRAM W/LOWER EXTREMITY

## 2015-07-22 MED ORDER — SODIUM CHLORIDE 0.9 % IV SOLN
INTRAVENOUS | Status: DC
  Administered 2015-07-22: 06:00:00 via INTRAVENOUS

## 2015-07-22 MED ORDER — IODIXANOL 320 MG/ML IV SOLN
INTRAVENOUS | Status: DC | PRN
  Administered 2015-07-22: 160 mL via INTRAVENOUS

## 2015-07-22 MED ORDER — GUAIFENESIN-CODEINE 100-10 MG/5ML PO SOLN
5.0000 mL | ORAL | Status: DC | PRN
  Administered 2015-07-22: 5 mL via ORAL
  Filled 2015-07-22: qty 5

## 2015-07-22 MED ORDER — HYDRALAZINE HCL 20 MG/ML IJ SOLN: 5.0000 mg | INTRAMUSCULAR | Status: DC | PRN

## 2015-07-22 MED ORDER — LIDOCAINE HCL (PF) 1 % IJ SOLN
INTRAMUSCULAR | Status: AC
  Filled 2015-07-22: qty 30

## 2015-07-22 MED ORDER — HEPARIN (PORCINE) IN NACL 2-0.9 UNIT/ML-% IJ SOLN
INTRAMUSCULAR | Status: AC
  Filled 2015-07-22: qty 1000

## 2015-07-22 MED ORDER — HEPARIN (PORCINE) IN NACL 2-0.9 UNIT/ML-% IJ SOLN
INTRAMUSCULAR | Status: DC | PRN
Start: 2015-07-22 — End: 2015-07-22
  Administered 2015-07-22: 500 mL via INTRA_ARTERIAL

## 2015-07-22 MED ORDER — ONDANSETRON HCL 4 MG/2ML IJ SOLN: 4.0000 mg | Freq: Four times a day (QID) | INTRAMUSCULAR | Status: DC | PRN

## 2015-07-22 MED ORDER — SODIUM CHLORIDE 0.45 % IV SOLN: INTRAVENOUS | Status: DC

## 2015-07-22 MED ORDER — PHENOL 1.4 % MT LIQD: 1.0000 | OROMUCOSAL | Status: DC | PRN

## 2015-07-22 MED ORDER — ALUM & MAG HYDROXIDE-SIMETH 200-200-20 MG/5ML PO SUSP: 15.0000 mL | ORAL | Status: DC | PRN

## 2015-07-22 MED ORDER — GUAIFENESIN-DM 100-10 MG/5ML PO SYRP: 15.0000 mL | ORAL_SOLUTION | ORAL | Status: DC | PRN

## 2015-07-22 MED ORDER — HEPARIN SOD (PORK) LOCK FLUSH 100 UNIT/ML IV SOLN
INTRAVENOUS | Status: AC
  Administered 2015-07-22: 250 [IU]
  Filled 2015-07-22: qty 5

## 2015-07-22 MED ORDER — GUAIFENESIN-CODEINE 100-10 MG/5ML PO SOLN
ORAL | Status: AC
  Filled 2015-07-22: qty 5

## 2015-07-22 MED ORDER — LABETALOL HCL 5 MG/ML IV SOLN: 10.0000 mg | INTRAVENOUS | Status: DC | PRN

## 2015-07-22 MED ORDER — ACETAMINOPHEN 325 MG PO TABS: 325.0000 mg | ORAL_TABLET | ORAL | Status: DC | PRN

## 2015-07-22 MED ORDER — METOPROLOL TARTRATE 5 MG/5ML IV SOLN: 2.0000 mg | INTRAVENOUS | Status: DC | PRN

## 2015-07-22 MED ORDER — LIDOCAINE HCL (PF) 1 % IJ SOLN
INTRAMUSCULAR | Status: DC | PRN
  Administered 2015-07-22: 9 mL via SUBCUTANEOUS

## 2015-07-22 MED ORDER — ACETAMINOPHEN 325 MG RE SUPP: 325.0000 mg | RECTAL | Status: DC | PRN

## 2015-07-22 SURGICAL SUPPLY — 9 items
CATH OMNI FLUSH 5F 65CM (CATHETERS) ×2 IMPLANT
COVER PRB 48X5XTLSCP FOLD TPE (BAG) ×1 IMPLANT
COVER PROBE 5X48 (BAG) ×1
KIT PV (KITS) ×2 IMPLANT
SHEATH PINNACLE 5F 10CM (SHEATH) ×2 IMPLANT
SYR MEDRAD MARK V 150ML (SYRINGE) ×2 IMPLANT
TRANSDUCER W/STOPCOCK (MISCELLANEOUS) ×2 IMPLANT
TRAY PV CATH (CUSTOM PROCEDURE TRAY) ×2 IMPLANT
WIRE HITORQ VERSACORE ST 145CM (WIRE) ×2 IMPLANT

## 2015-07-22 NOTE — Progress Notes (Signed)
PICC line flushed with heparin, capped , and clamped

## 2015-07-22 NOTE — H&P (View-Only) (Signed)
[facility] [place]  Patient name: Mitchell [name] MRN: [id number] DOB: 1950/04/12 Sex: male  REFERRING PHYSICIAN: Dr. Tanda RockersNichols  REASON FOR CONSULT: Diabetic foot wound, right  HPI: Mitchell [name] is a 65 y.o. male, who is who is referred today for evaluation of a wound on the right foot.  This is been present for several months.  He has been on 6 weeks of IV antibiotics without significant improvement.  He is now getting daily packing and irrigations of the wound with iodoform.  He has undergone noninvasive vascular lab testing which shows normal ankle-brachial indices bilaterally.  The patient is diabetic.  His blood sugars range from 90-1 85.  He is a current smoker.  He is on a statin for hypercholesterolemia.  Past Medical History  Diagnosis Date  . Diabetes mellitus without complication (HCC)   . Arthritis   . Hypercholesterolemia   . Obesity 05/09/2014  . Maggot infestation     right foot ulcer  . Diabetic foot ulcer (HCC) 07/07/2014    Status post bedside debridement of multiple right plantar ulcers by podiatrist, Dr. Reynolds BowlLu.  . Cellulitis of right lower extremity 05/09/2014  . Tobacco abuse 07/07/2014    No family history on file.  SOCIAL HISTORY: Social History   Social History  . Marital Status: Married    Spouse Name: N/A  . Number of Children: N/A  . Years of Education: N/A   Occupational History  . Not on file.   Social History Main Topics  . Smoking status: Current Every Day Smoker -- 1.50 packs/day    Types: Cigarettes  . Smokeless tobacco: Never Used  . Alcohol Use: 0.0 oz/week    0 Standard drinks or equivalent per week     Comment: occ-twice a week beer approx 2  . Drug Use: No  . Sexual Activity: Not on file   Other Topics Concern  . Not on file   Social History Narrative    Allergies  Allergen Reactions  . Penicillins Itching and Rash    Current Outpatient Prescriptions  Medication Sig Dispense  Refill  . aspirin EC 81 MG tablet Take 81 mg by mouth daily.    . canagliflozin (INVOKANA) 300 MG TABS tablet Take 300 mg by mouth daily before breakfast.    . furosemide (LASIX) 20 MG tablet Take 1 tablet (20 mg total) by mouth daily. 30 tablet 0  . gabapentin (NEURONTIN) 300 MG capsule Take 1 capsule (300 mg total) by mouth 3 (three) times daily. 90 capsule 0  . lovastatin (MEVACOR) 20 MG tablet Take 1 tablet (20 mg total) by mouth at bedtime. 30 tablet 0  . metFORMIN (GLUCOPHAGE) 500 MG tablet Take 1,000 mg by mouth 2 (two) times daily.    . cephALEXin (KEFLEX) 500 MG capsule Take 1 capsule (500 mg total) by mouth 3 (three) times daily. (Patient not taking: Reported on 07/15/2015) 21 capsule 0  . HYDROcodone-acetaminophen (NORCO) 5-325 MG per tablet Take 1 tablet by mouth every 4 (four) hours as needed. (Patient not taking: Reported on 07/15/2015) 4 tablet 0  . HYDROcodone-acetaminophen (NORCO/VICODIN) 5-325 MG per tablet Take 1-2 tablets by mouth every 4 (four) hours as needed for moderate pain. (Patient not taking: Reported on 07/15/2015) 30 tablet 0  . insulin aspart (NOVOLOG) 100 UNIT/ML injection Inject 20-25 Units into the skin 2 (two) times daily. Reported on 07/15/2015    . Insulin Glargine (LANTUS) 100 UNIT/ML Solostar Pen Inject 30 Units into the skin 2 (two)  times daily. (Patient not taking: Reported on 07/15/2015)    . levofloxacin (LEVAQUIN) 750 MG tablet Take 1 tablet (750 mg total) by mouth daily. Antibiotic to be taken for 5 more days, starting tomorrow. (Patient not taking: Reported on 07/15/2015) 5 tablet 0   No current facility-administered medications for this visit.    REVIEW OF SYSTEMS:  [X] denotes positive finding, [ ] denotes negative finding Cardiac  Comments:  Chest pain or chest pressure:    Shortness of breath upon exertion:    Short of breath when lying flat:    Irregular heart rhythm:        Vascular    Pain in calf, thigh, or hip brought on by ambulation:      Pain in feet at night that wakes you up from your sleep:  x   Blood clot in your veins:    Leg swelling:  x       Pulmonary    Oxygen at home:    Productive cough:     Wheezing:         Neurologic    Sudden weakness in arms or legs:     Sudden numbness in arms or legs:     Sudden onset of difficulty speaking or slurred speech:    Temporary loss of vision in one eye:     Problems with dizziness:         Gastrointestinal    Blood in stool:     Vomited blood:         Genitourinary    Burning when urinating:     Blood in urine:        Psychiatric    Major depression:         Hematologic    Bleeding problems:    Problems with blood clotting too easily:        Skin    Rashes or ulcers: x       Constitutional    Fever or chills:      PHYSICAL EXAM: Filed Vitals:   07/15/15 1414  BP: 127/66  Pulse: 98  Temp: 99.5 F (37.5 C)  Resp: 16  Height: 5' 11.5" (1.816 m)  Weight: 248 lb (112.492 kg)  SpO2: 94%    GENERAL: The patient is a well-nourished male, in no acute distress. The vital signs are documented above. CARDIAC: There is a regular rate and rhythm.  VASCULAR: Edema to the right leg extending up onto the dorsum of the foot.  Pulses are not palpable. PULMONARY: There is good air exchange bilaterally without wheezing or rales. ABDOMEN: Soft and non-tender with normal pitched bowel sounds.  MUSCULOSKELETAL: There are no major deformities or cyanosis. NEUROLOGIC: No focal weakness or paresthesias are detected. SKIN: Small defect on the right metatarsal head #5 PSYCHIATRIC: The patient has a normal affect.  DATA:  I have reviewed his ankle-brachial indices.  On the right it measures 0.96 and on the left it measures 1.03.  His toe pressure on the right is 70  X-rays of the foot note osteo- in the right foot  MEDICAL ISSUES: Right foot infection and diabetic: I discussed with the patient I think he is a high risk for limb loss.  Despite normal ankle-brachial  indices, his toe pressures are slightly decreased.  Ideally, a diabetic would need a toe pressure of 80 for wound healing.  His pressure is 70.  Therefore I think he needs to undergo angiography to see if he has a lesion   which would be amenable to intervention. If he does, I would proceed with intervention at that time and an attempt to help with limb salvage.  I'll plan on cannulation of the left groin and imaging of the right leg.  I discussed the risks of the procedure and the details with the patient.  We will proceed on Thursday, July 6   Durene CalWells Brabham, MD [facility] Tel [phone] Pager [phone]

## 2015-07-22 NOTE — Op Note (Signed)
Procedure: Abdominal aortogram with bilateral lower extremity runoff  Preoperative diagnosis: Nonhealing wound right foot  Postoperative diagnosis: Same  Anesthesia: Local  Operative findings: Widely patent aortoiliac SFA popliteal and tibial arteries  Operative details: After obtaining informed consent, the patient was taken to the PV lab. The patient was placed in supine position the Angio table. Both groins were prepped and draped in usual sterile fashion. Local anesthesia was ensured of the left common femoral artery. Ultrasound was used to identify the left common femoral artery and the femoral bifurcation. Using ultrasound guidance, the left common femoral artery was successfully cannulated and 035 versacore wire threaded up into the abdominal aorta under fluoroscopic guidance. Next a 5 French sheath was passed over the guidewire into the left common femoral artery and thoroughly flushed with heparinized saline. Next a 5 French Omni Flush catheter was advanced over the guidewire into the abdominal aorta and abdominal aortogram was obtained in AP projection. The left and right renal arteries are widely patent. The infrarenal abdominal aorta is widely patent. The left and right common external and internal iliac arteries are all widely patent. Next the Omni Flush catheter was pulled down just above the aortic bifurcation and a pelvic angiogram was performed which confirmed the above findings as well as wide patency of the common femoral and origins of the profunda and superficial femoral arteries. Lower extremity runoff views were then obtained through the Omni Flush catheter.  In the left lower extremity, the left common femoral profunda femoris and superficial femoral arteries are patent. There is some irregularity in the mid to distal left superficial femoral artery but no significant stenosis. The anterior tibial and posterior tibial arteries are widely patent in the left leg. The peroneal artery  is patent proximally but diminutive in its distal segment.  In the right lower extremity, the right common femoral artery profunda femoris and superficial femoral arteries are widely patent. There is also some irregularity of the superficial femoral artery on the right side but this again is not flow-limiting and less than 10% stenosis. The right popliteal anterior tibial posterior tibial and peroneal arteries are all widely patent. There is brisk flow to the venous system suggesting chronic AV shunting.  At this point the Omni Flush catheter was removed over a guidewire. The 5 French sheath was left in place to be pulled in the holding area. The patient tolerated the procedure well and there were no complications. The patient was taken to the holding area in stable condition.  Operative management: The patient has patent arterial system bilaterally. No vascular surgical intervention at this point.  Fabienne Brunsharles Swara Donze, MD [facility] Office: [phone] Pager: [phone]

## 2015-07-22 NOTE — Discharge Instructions (Signed)
°

## 2015-07-22 NOTE — Progress Notes (Addendum)
Left groin sheath pull manual pressure held for 20 min by Griffith CitronHayley Allix Blomquist, RN Site Pre: level 0  Site Post: level 0 Distal Pulse: palpable pre and post sheath pull.  Bed rest starts at: 0925 for 5 hours Post sheath pull instructions given.

## 2015-07-22 NOTE — Interval H&P Note (Signed)
History and Physical Interval Note:  07/22/2015 7:29 AM  Mitchell ButtGary Hearn  has presented today for surgery, with the diagnosis of right diabetic foot wound  The various methods of treatment have been discussed with the patient and family. After consideration of risks, benefits and other options for treatment, the patient has consented to  Procedure(s): Abdominal Aortogram w/Lower Extremity (N/A) as a surgical intervention .  The patient's history has been reviewed, patient examined, no change in status, stable for surgery.  I have reviewed the patient's chart and labs.  Questions were answered to the patient's satisfaction.     Fabienne BrunsFields, Charles

## 2015-07-25 ENCOUNTER — Telehealth: Payer: Self-pay | Admitting: *Deleted

## 2015-07-25 MED FILL — Heparin Sodium (Porcine) 2 Unit/ML in Sodium Chloride 0.9%: INTRAMUSCULAR | Qty: 500 | Status: AC

## 2015-07-25 NOTE — Telephone Encounter (Signed)
-----   Message from Barnett Hatteregina Seymour sent at 07/25/2015  7:17 AM EDT ----- Regarding: Kay's log   ----- Message -----    From: Nada LibmanVance W Brabham, MD    Sent: 07/22/2015  11:10 PM      To: Vvs Charge Pool  Note from angio sent to Dr. Tanda RockersNichols.  Patient does not need vascular follow up.  Communicate to wound center ----- Message -----    From: Sherren Kernsharles E Fields, MD    Sent: 07/22/2015   8:23 AM      To: Nada LibmanVance W Brabham, MD  Mr Katrinka BlazingSmith  Open arteries both legs.  Will let you decide if he needs follow up with you or the wound center  [facility]

## 2015-07-25 NOTE — Telephone Encounter (Signed)
Called the [facility] [phone] and left a message for them to call us back regarding this staff message from Dr. Myra GianottiBrabham.

## 2015-10-06 ENCOUNTER — Ambulatory Visit (INDEPENDENT_AMBULATORY_CARE_PROVIDER_SITE_OTHER): Payer: Medicare HMO | Admitting: Otolaryngology

## 2015-10-06 DIAGNOSIS — H6983 Other specified disorders of Eustachian tube, bilateral: Secondary | ICD-10-CM | POA: Diagnosis not present

## 2015-10-06 DIAGNOSIS — H903 Sensorineural hearing loss, bilateral: Secondary | ICD-10-CM | POA: Diagnosis not present

## 2016-02-22 DIAGNOSIS — E1129 Type 2 diabetes mellitus with other diabetic kidney complication: Secondary | ICD-10-CM | POA: Diagnosis not present

## 2016-03-09 DIAGNOSIS — K219 Gastro-esophageal reflux disease without esophagitis: Secondary | ICD-10-CM | POA: Diagnosis not present

## 2016-03-09 DIAGNOSIS — M869 Osteomyelitis, unspecified: Secondary | ICD-10-CM | POA: Diagnosis not present

## 2016-03-09 DIAGNOSIS — Z713 Dietary counseling and surveillance: Secondary | ICD-10-CM | POA: Diagnosis not present

## 2016-03-09 DIAGNOSIS — I83009 Varicose veins of unspecified lower extremity with ulcer of unspecified site: Secondary | ICD-10-CM | POA: Diagnosis not present

## 2016-03-09 DIAGNOSIS — E11621 Type 2 diabetes mellitus with foot ulcer: Secondary | ICD-10-CM | POA: Diagnosis not present

## 2016-03-09 DIAGNOSIS — Z299 Encounter for prophylactic measures, unspecified: Secondary | ICD-10-CM | POA: Diagnosis not present

## 2016-03-09 DIAGNOSIS — Z6838 Body mass index (BMI) 38.0-38.9, adult: Secondary | ICD-10-CM | POA: Diagnosis not present

## 2016-03-09 DIAGNOSIS — L97509 Non-pressure chronic ulcer of other part of unspecified foot with unspecified severity: Secondary | ICD-10-CM | POA: Diagnosis not present

## 2016-03-09 DIAGNOSIS — G629 Polyneuropathy, unspecified: Secondary | ICD-10-CM | POA: Diagnosis not present

## 2016-03-09 DIAGNOSIS — E1142 Type 2 diabetes mellitus with diabetic polyneuropathy: Secondary | ICD-10-CM | POA: Diagnosis not present

## 2016-04-12 DIAGNOSIS — E119 Type 2 diabetes mellitus without complications: Secondary | ICD-10-CM | POA: Diagnosis not present

## 2016-04-12 DIAGNOSIS — E7800 Pure hypercholesterolemia, unspecified: Secondary | ICD-10-CM | POA: Diagnosis not present

## 2016-04-25 DIAGNOSIS — E1129 Type 2 diabetes mellitus with other diabetic kidney complication: Secondary | ICD-10-CM | POA: Diagnosis not present

## 2016-05-03 DIAGNOSIS — Z7189 Other specified counseling: Secondary | ICD-10-CM | POA: Diagnosis not present

## 2016-05-03 DIAGNOSIS — L97909 Non-pressure chronic ulcer of unspecified part of unspecified lower leg with unspecified severity: Secondary | ICD-10-CM | POA: Diagnosis not present

## 2016-05-03 DIAGNOSIS — Z79899 Other long term (current) drug therapy: Secondary | ICD-10-CM | POA: Diagnosis not present

## 2016-05-03 DIAGNOSIS — Z6838 Body mass index (BMI) 38.0-38.9, adult: Secondary | ICD-10-CM | POA: Diagnosis not present

## 2016-05-03 DIAGNOSIS — Z1211 Encounter for screening for malignant neoplasm of colon: Secondary | ICD-10-CM | POA: Diagnosis not present

## 2016-05-03 DIAGNOSIS — R69 Illness, unspecified: Secondary | ICD-10-CM | POA: Diagnosis not present

## 2016-05-03 DIAGNOSIS — M869 Osteomyelitis, unspecified: Secondary | ICD-10-CM | POA: Diagnosis not present

## 2016-05-03 DIAGNOSIS — Z299 Encounter for prophylactic measures, unspecified: Secondary | ICD-10-CM | POA: Diagnosis not present

## 2016-05-03 DIAGNOSIS — Z125 Encounter for screening for malignant neoplasm of prostate: Secondary | ICD-10-CM | POA: Diagnosis not present

## 2016-05-03 DIAGNOSIS — R5383 Other fatigue: Secondary | ICD-10-CM | POA: Diagnosis not present

## 2016-05-03 DIAGNOSIS — I83009 Varicose veins of unspecified lower extremity with ulcer of unspecified site: Secondary | ICD-10-CM | POA: Diagnosis not present

## 2016-05-03 DIAGNOSIS — Z1389 Encounter for screening for other disorder: Secondary | ICD-10-CM | POA: Diagnosis not present

## 2016-05-03 DIAGNOSIS — E7800 Pure hypercholesterolemia, unspecified: Secondary | ICD-10-CM | POA: Diagnosis not present

## 2016-05-03 DIAGNOSIS — Z0000 Encounter for general adult medical examination without abnormal findings: Secondary | ICD-10-CM | POA: Diagnosis not present

## 2016-05-05 DIAGNOSIS — Z6837 Body mass index (BMI) 37.0-37.9, adult: Secondary | ICD-10-CM | POA: Diagnosis not present

## 2016-05-05 DIAGNOSIS — E1142 Type 2 diabetes mellitus with diabetic polyneuropathy: Secondary | ICD-10-CM | POA: Diagnosis not present

## 2016-05-05 DIAGNOSIS — Z972 Presence of dental prosthetic device (complete) (partial): Secondary | ICD-10-CM | POA: Diagnosis not present

## 2016-05-05 DIAGNOSIS — Z79899 Other long term (current) drug therapy: Secondary | ICD-10-CM | POA: Diagnosis not present

## 2016-05-05 DIAGNOSIS — R600 Localized edema: Secondary | ICD-10-CM | POA: Diagnosis not present

## 2016-05-05 DIAGNOSIS — R69 Illness, unspecified: Secondary | ICD-10-CM | POA: Diagnosis not present

## 2016-05-05 DIAGNOSIS — K08109 Complete loss of teeth, unspecified cause, unspecified class: Secondary | ICD-10-CM | POA: Diagnosis not present

## 2016-05-05 DIAGNOSIS — E7800 Pure hypercholesterolemia, unspecified: Secondary | ICD-10-CM | POA: Diagnosis not present

## 2016-05-05 DIAGNOSIS — R0602 Shortness of breath: Secondary | ICD-10-CM | POA: Diagnosis not present

## 2016-05-05 DIAGNOSIS — J301 Allergic rhinitis due to pollen: Secondary | ICD-10-CM | POA: Diagnosis not present

## 2016-05-05 DIAGNOSIS — Z0000 Encounter for general adult medical examination without abnormal findings: Secondary | ICD-10-CM | POA: Diagnosis not present

## 2016-05-09 DIAGNOSIS — E119 Type 2 diabetes mellitus without complications: Secondary | ICD-10-CM | POA: Diagnosis not present

## 2016-05-09 DIAGNOSIS — E7800 Pure hypercholesterolemia, unspecified: Secondary | ICD-10-CM | POA: Diagnosis not present

## 2016-05-22 DIAGNOSIS — E1129 Type 2 diabetes mellitus with other diabetic kidney complication: Secondary | ICD-10-CM | POA: Diagnosis not present

## 2016-06-14 DIAGNOSIS — E1142 Type 2 diabetes mellitus with diabetic polyneuropathy: Secondary | ICD-10-CM | POA: Diagnosis not present

## 2016-06-14 DIAGNOSIS — K219 Gastro-esophageal reflux disease without esophagitis: Secondary | ICD-10-CM | POA: Diagnosis not present

## 2016-06-14 DIAGNOSIS — E7800 Pure hypercholesterolemia, unspecified: Secondary | ICD-10-CM | POA: Diagnosis not present

## 2016-06-14 DIAGNOSIS — E1165 Type 2 diabetes mellitus with hyperglycemia: Secondary | ICD-10-CM | POA: Diagnosis not present

## 2016-06-14 DIAGNOSIS — Z299 Encounter for prophylactic measures, unspecified: Secondary | ICD-10-CM | POA: Diagnosis not present

## 2016-06-14 DIAGNOSIS — Z6838 Body mass index (BMI) 38.0-38.9, adult: Secondary | ICD-10-CM | POA: Diagnosis not present

## 2016-06-14 DIAGNOSIS — E11621 Type 2 diabetes mellitus with foot ulcer: Secondary | ICD-10-CM | POA: Diagnosis not present

## 2016-06-14 DIAGNOSIS — L97509 Non-pressure chronic ulcer of other part of unspecified foot with unspecified severity: Secondary | ICD-10-CM | POA: Diagnosis not present

## 2016-06-14 DIAGNOSIS — R69 Illness, unspecified: Secondary | ICD-10-CM | POA: Diagnosis not present

## 2016-06-28 DIAGNOSIS — M869 Osteomyelitis, unspecified: Secondary | ICD-10-CM | POA: Diagnosis not present

## 2016-06-28 DIAGNOSIS — E1165 Type 2 diabetes mellitus with hyperglycemia: Secondary | ICD-10-CM | POA: Diagnosis not present

## 2016-06-28 DIAGNOSIS — E7800 Pure hypercholesterolemia, unspecified: Secondary | ICD-10-CM | POA: Diagnosis not present

## 2016-06-28 DIAGNOSIS — Z6839 Body mass index (BMI) 39.0-39.9, adult: Secondary | ICD-10-CM | POA: Diagnosis not present

## 2016-06-28 DIAGNOSIS — G629 Polyneuropathy, unspecified: Secondary | ICD-10-CM | POA: Diagnosis not present

## 2016-06-28 DIAGNOSIS — Z299 Encounter for prophylactic measures, unspecified: Secondary | ICD-10-CM | POA: Diagnosis not present

## 2016-06-28 DIAGNOSIS — R69 Illness, unspecified: Secondary | ICD-10-CM | POA: Diagnosis not present

## 2016-06-28 DIAGNOSIS — L97909 Non-pressure chronic ulcer of unspecified part of unspecified lower leg with unspecified severity: Secondary | ICD-10-CM | POA: Diagnosis not present

## 2016-06-28 DIAGNOSIS — E1142 Type 2 diabetes mellitus with diabetic polyneuropathy: Secondary | ICD-10-CM | POA: Diagnosis not present

## 2016-06-28 DIAGNOSIS — I83009 Varicose veins of unspecified lower extremity with ulcer of unspecified site: Secondary | ICD-10-CM | POA: Diagnosis not present

## 2016-07-24 DIAGNOSIS — E1129 Type 2 diabetes mellitus with other diabetic kidney complication: Secondary | ICD-10-CM | POA: Diagnosis not present

## 2016-08-09 DIAGNOSIS — Z299 Encounter for prophylactic measures, unspecified: Secondary | ICD-10-CM | POA: Diagnosis not present

## 2016-08-09 DIAGNOSIS — L97509 Non-pressure chronic ulcer of other part of unspecified foot with unspecified severity: Secondary | ICD-10-CM | POA: Diagnosis not present

## 2016-08-09 DIAGNOSIS — E7800 Pure hypercholesterolemia, unspecified: Secondary | ICD-10-CM | POA: Diagnosis not present

## 2016-08-09 DIAGNOSIS — E1165 Type 2 diabetes mellitus with hyperglycemia: Secondary | ICD-10-CM | POA: Diagnosis not present

## 2016-08-09 DIAGNOSIS — E1142 Type 2 diabetes mellitus with diabetic polyneuropathy: Secondary | ICD-10-CM | POA: Diagnosis not present

## 2016-08-09 DIAGNOSIS — R69 Illness, unspecified: Secondary | ICD-10-CM | POA: Diagnosis not present

## 2016-08-09 DIAGNOSIS — E11621 Type 2 diabetes mellitus with foot ulcer: Secondary | ICD-10-CM | POA: Diagnosis not present

## 2016-08-09 DIAGNOSIS — M869 Osteomyelitis, unspecified: Secondary | ICD-10-CM | POA: Diagnosis not present

## 2016-08-09 DIAGNOSIS — Z6838 Body mass index (BMI) 38.0-38.9, adult: Secondary | ICD-10-CM | POA: Diagnosis not present

## 2016-08-16 DIAGNOSIS — R69 Illness, unspecified: Secondary | ICD-10-CM | POA: Diagnosis not present

## 2016-08-16 DIAGNOSIS — E11621 Type 2 diabetes mellitus with foot ulcer: Secondary | ICD-10-CM | POA: Diagnosis not present

## 2016-08-16 DIAGNOSIS — L97519 Non-pressure chronic ulcer of other part of right foot with unspecified severity: Secondary | ICD-10-CM | POA: Diagnosis not present

## 2016-08-20 DIAGNOSIS — E1129 Type 2 diabetes mellitus with other diabetic kidney complication: Secondary | ICD-10-CM | POA: Diagnosis not present

## 2016-08-21 DIAGNOSIS — E11621 Type 2 diabetes mellitus with foot ulcer: Secondary | ICD-10-CM | POA: Diagnosis not present

## 2016-08-21 DIAGNOSIS — L97512 Non-pressure chronic ulcer of other part of right foot with fat layer exposed: Secondary | ICD-10-CM | POA: Diagnosis not present

## 2016-09-04 DIAGNOSIS — L97512 Non-pressure chronic ulcer of other part of right foot with fat layer exposed: Secondary | ICD-10-CM | POA: Diagnosis not present

## 2016-09-04 DIAGNOSIS — E11621 Type 2 diabetes mellitus with foot ulcer: Secondary | ICD-10-CM | POA: Diagnosis not present

## 2016-09-20 DIAGNOSIS — I83009 Varicose veins of unspecified lower extremity with ulcer of unspecified site: Secondary | ICD-10-CM | POA: Diagnosis not present

## 2016-09-20 DIAGNOSIS — Z6841 Body Mass Index (BMI) 40.0 and over, adult: Secondary | ICD-10-CM | POA: Diagnosis not present

## 2016-09-20 DIAGNOSIS — Z299 Encounter for prophylactic measures, unspecified: Secondary | ICD-10-CM | POA: Diagnosis not present

## 2016-09-20 DIAGNOSIS — E1142 Type 2 diabetes mellitus with diabetic polyneuropathy: Secondary | ICD-10-CM | POA: Diagnosis not present

## 2016-09-20 DIAGNOSIS — R69 Illness, unspecified: Secondary | ICD-10-CM | POA: Diagnosis not present

## 2016-09-20 DIAGNOSIS — Z713 Dietary counseling and surveillance: Secondary | ICD-10-CM | POA: Diagnosis not present

## 2016-09-20 DIAGNOSIS — L97909 Non-pressure chronic ulcer of unspecified part of unspecified lower leg with unspecified severity: Secondary | ICD-10-CM | POA: Diagnosis not present

## 2016-09-20 DIAGNOSIS — E1165 Type 2 diabetes mellitus with hyperglycemia: Secondary | ICD-10-CM | POA: Diagnosis not present

## 2016-09-20 DIAGNOSIS — E7800 Pure hypercholesterolemia, unspecified: Secondary | ICD-10-CM | POA: Diagnosis not present

## 2016-10-19 DIAGNOSIS — E7800 Pure hypercholesterolemia, unspecified: Secondary | ICD-10-CM | POA: Diagnosis not present

## 2016-10-19 DIAGNOSIS — E119 Type 2 diabetes mellitus without complications: Secondary | ICD-10-CM | POA: Diagnosis not present

## 2016-10-23 DIAGNOSIS — E1129 Type 2 diabetes mellitus with other diabetic kidney complication: Secondary | ICD-10-CM | POA: Diagnosis not present

## 2016-11-19 DIAGNOSIS — E1129 Type 2 diabetes mellitus with other diabetic kidney complication: Secondary | ICD-10-CM | POA: Diagnosis not present

## 2016-11-22 DIAGNOSIS — E7800 Pure hypercholesterolemia, unspecified: Secondary | ICD-10-CM | POA: Diagnosis not present

## 2016-11-22 DIAGNOSIS — E119 Type 2 diabetes mellitus without complications: Secondary | ICD-10-CM | POA: Diagnosis not present

## 2016-12-17 DIAGNOSIS — E119 Type 2 diabetes mellitus without complications: Secondary | ICD-10-CM | POA: Diagnosis not present

## 2016-12-17 DIAGNOSIS — E7800 Pure hypercholesterolemia, unspecified: Secondary | ICD-10-CM | POA: Diagnosis not present

## 2016-12-27 DIAGNOSIS — E1142 Type 2 diabetes mellitus with diabetic polyneuropathy: Secondary | ICD-10-CM | POA: Diagnosis not present

## 2016-12-27 DIAGNOSIS — R69 Illness, unspecified: Secondary | ICD-10-CM | POA: Diagnosis not present

## 2016-12-27 DIAGNOSIS — Z2821 Immunization not carried out because of patient refusal: Secondary | ICD-10-CM | POA: Diagnosis not present

## 2016-12-27 DIAGNOSIS — Z299 Encounter for prophylactic measures, unspecified: Secondary | ICD-10-CM | POA: Diagnosis not present

## 2016-12-27 DIAGNOSIS — M869 Osteomyelitis, unspecified: Secondary | ICD-10-CM | POA: Diagnosis not present

## 2016-12-27 DIAGNOSIS — Z6841 Body Mass Index (BMI) 40.0 and over, adult: Secondary | ICD-10-CM | POA: Diagnosis not present

## 2016-12-27 DIAGNOSIS — E1165 Type 2 diabetes mellitus with hyperglycemia: Secondary | ICD-10-CM | POA: Diagnosis not present

## 2016-12-31 DIAGNOSIS — R69 Illness, unspecified: Secondary | ICD-10-CM | POA: Diagnosis not present

## 2016-12-31 DIAGNOSIS — L859 Epidermal thickening, unspecified: Secondary | ICD-10-CM | POA: Diagnosis not present

## 2016-12-31 DIAGNOSIS — E1151 Type 2 diabetes mellitus with diabetic peripheral angiopathy without gangrene: Secondary | ICD-10-CM | POA: Diagnosis not present

## 2017-01-21 DIAGNOSIS — E1129 Type 2 diabetes mellitus with other diabetic kidney complication: Secondary | ICD-10-CM | POA: Diagnosis not present

## 2017-01-23 DIAGNOSIS — E119 Type 2 diabetes mellitus without complications: Secondary | ICD-10-CM | POA: Diagnosis not present

## 2017-01-23 DIAGNOSIS — E7800 Pure hypercholesterolemia, unspecified: Secondary | ICD-10-CM | POA: Diagnosis not present

## 2017-02-17 DIAGNOSIS — E1129 Type 2 diabetes mellitus with other diabetic kidney complication: Secondary | ICD-10-CM | POA: Diagnosis not present

## 2017-02-20 DIAGNOSIS — E119 Type 2 diabetes mellitus without complications: Secondary | ICD-10-CM | POA: Diagnosis not present

## 2017-02-20 DIAGNOSIS — E7800 Pure hypercholesterolemia, unspecified: Secondary | ICD-10-CM | POA: Diagnosis not present

## 2017-03-27 DIAGNOSIS — E7800 Pure hypercholesterolemia, unspecified: Secondary | ICD-10-CM | POA: Diagnosis not present

## 2017-03-27 DIAGNOSIS — E119 Type 2 diabetes mellitus without complications: Secondary | ICD-10-CM | POA: Diagnosis not present

## 2017-04-02 DIAGNOSIS — Z299 Encounter for prophylactic measures, unspecified: Secondary | ICD-10-CM | POA: Diagnosis not present

## 2017-04-02 DIAGNOSIS — I83009 Varicose veins of unspecified lower extremity with ulcer of unspecified site: Secondary | ICD-10-CM | POA: Diagnosis not present

## 2017-04-02 DIAGNOSIS — L97909 Non-pressure chronic ulcer of unspecified part of unspecified lower leg with unspecified severity: Secondary | ICD-10-CM | POA: Diagnosis not present

## 2017-04-02 DIAGNOSIS — Z6841 Body Mass Index (BMI) 40.0 and over, adult: Secondary | ICD-10-CM | POA: Diagnosis not present

## 2017-04-02 DIAGNOSIS — M869 Osteomyelitis, unspecified: Secondary | ICD-10-CM | POA: Diagnosis not present

## 2017-04-02 DIAGNOSIS — E1142 Type 2 diabetes mellitus with diabetic polyneuropathy: Secondary | ICD-10-CM | POA: Diagnosis not present

## 2017-04-02 DIAGNOSIS — E1165 Type 2 diabetes mellitus with hyperglycemia: Secondary | ICD-10-CM | POA: Diagnosis not present

## 2017-04-04 DIAGNOSIS — R69 Illness, unspecified: Secondary | ICD-10-CM | POA: Diagnosis not present

## 2017-04-04 DIAGNOSIS — Z7982 Long term (current) use of aspirin: Secondary | ICD-10-CM | POA: Diagnosis not present

## 2017-04-04 DIAGNOSIS — Z794 Long term (current) use of insulin: Secondary | ICD-10-CM | POA: Diagnosis not present

## 2017-04-04 DIAGNOSIS — E119 Type 2 diabetes mellitus without complications: Secondary | ICD-10-CM | POA: Diagnosis not present

## 2017-04-04 DIAGNOSIS — I872 Venous insufficiency (chronic) (peripheral): Secondary | ICD-10-CM | POA: Diagnosis not present

## 2017-04-04 DIAGNOSIS — Z8631 Personal history of diabetic foot ulcer: Secondary | ICD-10-CM | POA: Diagnosis not present

## 2017-04-04 DIAGNOSIS — Z79899 Other long term (current) drug therapy: Secondary | ICD-10-CM | POA: Diagnosis not present

## 2017-04-21 DIAGNOSIS — E1129 Type 2 diabetes mellitus with other diabetic kidney complication: Secondary | ICD-10-CM | POA: Diagnosis not present

## 2017-04-25 DIAGNOSIS — E119 Type 2 diabetes mellitus without complications: Secondary | ICD-10-CM | POA: Diagnosis not present

## 2017-04-25 DIAGNOSIS — E7800 Pure hypercholesterolemia, unspecified: Secondary | ICD-10-CM | POA: Diagnosis not present

## 2017-05-02 DIAGNOSIS — I872 Venous insufficiency (chronic) (peripheral): Secondary | ICD-10-CM | POA: Diagnosis not present

## 2017-05-07 DIAGNOSIS — Z6841 Body Mass Index (BMI) 40.0 and over, adult: Secondary | ICD-10-CM | POA: Diagnosis not present

## 2017-05-07 DIAGNOSIS — Z299 Encounter for prophylactic measures, unspecified: Secondary | ICD-10-CM | POA: Diagnosis not present

## 2017-05-07 DIAGNOSIS — E1142 Type 2 diabetes mellitus with diabetic polyneuropathy: Secondary | ICD-10-CM | POA: Diagnosis not present

## 2017-05-07 DIAGNOSIS — L97909 Non-pressure chronic ulcer of unspecified part of unspecified lower leg with unspecified severity: Secondary | ICD-10-CM | POA: Diagnosis not present

## 2017-05-07 DIAGNOSIS — M869 Osteomyelitis, unspecified: Secondary | ICD-10-CM | POA: Diagnosis not present

## 2017-05-07 DIAGNOSIS — E1165 Type 2 diabetes mellitus with hyperglycemia: Secondary | ICD-10-CM | POA: Diagnosis not present

## 2017-05-07 DIAGNOSIS — I83009 Varicose veins of unspecified lower extremity with ulcer of unspecified site: Secondary | ICD-10-CM | POA: Diagnosis not present

## 2017-05-08 DIAGNOSIS — R69 Illness, unspecified: Secondary | ICD-10-CM | POA: Diagnosis not present

## 2017-05-09 DIAGNOSIS — E1165 Type 2 diabetes mellitus with hyperglycemia: Secondary | ICD-10-CM | POA: Diagnosis not present

## 2017-05-09 DIAGNOSIS — E1142 Type 2 diabetes mellitus with diabetic polyneuropathy: Secondary | ICD-10-CM | POA: Diagnosis not present

## 2017-05-09 DIAGNOSIS — Z1331 Encounter for screening for depression: Secondary | ICD-10-CM | POA: Diagnosis not present

## 2017-05-09 DIAGNOSIS — Z6841 Body Mass Index (BMI) 40.0 and over, adult: Secondary | ICD-10-CM | POA: Diagnosis not present

## 2017-05-09 DIAGNOSIS — R5383 Other fatigue: Secondary | ICD-10-CM | POA: Diagnosis not present

## 2017-05-09 DIAGNOSIS — L97909 Non-pressure chronic ulcer of unspecified part of unspecified lower leg with unspecified severity: Secondary | ICD-10-CM | POA: Diagnosis not present

## 2017-05-09 DIAGNOSIS — Z1211 Encounter for screening for malignant neoplasm of colon: Secondary | ICD-10-CM | POA: Diagnosis not present

## 2017-05-09 DIAGNOSIS — Z299 Encounter for prophylactic measures, unspecified: Secondary | ICD-10-CM | POA: Diagnosis not present

## 2017-05-09 DIAGNOSIS — Z125 Encounter for screening for malignant neoplasm of prostate: Secondary | ICD-10-CM | POA: Diagnosis not present

## 2017-05-09 DIAGNOSIS — Z79899 Other long term (current) drug therapy: Secondary | ICD-10-CM | POA: Diagnosis not present

## 2017-05-09 DIAGNOSIS — Z7189 Other specified counseling: Secondary | ICD-10-CM | POA: Diagnosis not present

## 2017-05-09 DIAGNOSIS — Z1339 Encounter for screening examination for other mental health and behavioral disorders: Secondary | ICD-10-CM | POA: Diagnosis not present

## 2017-05-09 DIAGNOSIS — Z0000 Encounter for general adult medical examination without abnormal findings: Secondary | ICD-10-CM | POA: Diagnosis not present

## 2017-05-13 ENCOUNTER — Ambulatory Visit: Payer: Medicare HMO | Admitting: Podiatry

## 2017-05-13 DIAGNOSIS — M2042 Other hammer toe(s) (acquired), left foot: Secondary | ICD-10-CM | POA: Diagnosis not present

## 2017-05-13 DIAGNOSIS — M2020 Hallux rigidus, unspecified foot: Secondary | ICD-10-CM

## 2017-05-13 DIAGNOSIS — B351 Tinea unguium: Secondary | ICD-10-CM

## 2017-05-13 DIAGNOSIS — M79674 Pain in right toe(s): Secondary | ICD-10-CM

## 2017-05-13 DIAGNOSIS — M205X9 Other deformities of toe(s) (acquired), unspecified foot: Secondary | ICD-10-CM | POA: Diagnosis not present

## 2017-05-13 DIAGNOSIS — E1151 Type 2 diabetes mellitus with diabetic peripheral angiopathy without gangrene: Secondary | ICD-10-CM | POA: Diagnosis not present

## 2017-05-13 DIAGNOSIS — Z872 Personal history of diseases of the skin and subcutaneous tissue: Secondary | ICD-10-CM

## 2017-05-13 DIAGNOSIS — M2041 Other hammer toe(s) (acquired), right foot: Secondary | ICD-10-CM

## 2017-05-13 DIAGNOSIS — M79675 Pain in left toe(s): Secondary | ICD-10-CM | POA: Diagnosis not present

## 2017-05-13 DIAGNOSIS — G629 Polyneuropathy, unspecified: Secondary | ICD-10-CM

## 2017-05-13 NOTE — Patient Instructions (Signed)

## 2017-05-14 NOTE — Progress Notes (Signed)
Subjective:   Patient ID: Mitchell [name], male   DOB: 68 y.o.   MRN: [id number]   HPI 66 year old male presents the office today requesting diabetic shoes.  He did get a pair of diabetic shoes from [facility] last year he did not like the way the foot.  He has a history of a wound to the right fifth toe and he was under the care of the wound care center in [place] for this.  Currently denies any open sores.  He has had " on both legs due to poor circulationsurgery".  He also states he does not feel the feet very well.  He denies any swelling or redness to his feet currently.  He has no other concerns.   Review of Systems  All other systems reviewed and are negative.  Past Medical History:  Diagnosis Date  . Arthritis   . Cellulitis of right lower extremity 05/09/2014  . Diabetes mellitus without complication (HCC)   . Diabetic foot ulcer (HCC) 07/07/2014   Status post bedside debridement of multiple right plantar ulcers by podiatrist, Dr. Reynolds Bowl.  . Hypercholesterolemia   . Maggot infestation    right foot ulcer  . Obesity 05/09/2014  . Tobacco abuse 07/07/2014    Past Surgical History:  Procedure Laterality Date  . APPENDECTOMY    . PERIPHERAL VASCULAR CATHETERIZATION N/A 07/22/2015   Procedure: Abdominal Aortogram w/Lower Extremity;  Surgeon: Sherren Kerns, MD;  Location: [facility] INVASIVE CV LAB;  Service: Cardiovascular;  Laterality: N/A;     Current Outpatient Medications:  .  aspirin EC 81 MG tablet, Take 81 mg by mouth daily., Disp: , Rfl:  .  canagliflozin (INVOKANA) 300 MG TABS tablet, Take 300 mg by mouth daily before breakfast., Disp: , Rfl:  .  furosemide (LASIX) 20 MG tablet, Take 1 tablet (20 mg total) by mouth daily., Disp: 30 tablet, Rfl: 0 .  gabapentin (NEURONTIN) 300 MG capsule, Take 1 capsule (300 mg total) by mouth 3 (three) times daily., Disp: 90 capsule, Rfl: 0 .  lovastatin (MEVACOR) 20 MG tablet, Take 1 tablet (20 mg total) by mouth at bedtime., Disp: 30 tablet, Rfl: 0 .   metFORMIN (GLUCOPHAGE) 500 MG tablet, Take 1,000 mg by mouth 2 (two) times daily., Disp: , Rfl:  .  TRESIBA FLEXTOUCH 200 UNIT/ML SOPN, Inject 55 Units into the skin daily., Disp: , Rfl:   Allergies  Allergen Reactions  . Penicillins Itching and Rash    Has patient had a PCN reaction causing immediate rash, facial/tongue/throat swelling, SOB or lightheadedness with hypotension: Yes Has patient had a PCN reaction causing severe rash involving mucus membranes or skin necrosis: No Has patient had a PCN reaction that required hospitalization No Has patient had a PCN reaction occurring within the last 10 years: No If all of the above answers are "NO", then may proceed with Cephalosporin use.     Social History   Socioeconomic History  . Marital status: Married    Spouse name: Not on file  . Number of children: Not on file  . Years of education: Not on file  . Highest education level: Not on file  Occupational History  . Not on file  Social Needs  . Financial resource strain: Not on file  . Food insecurity:    Worry: Not on file    Inability: Not on file  . Transportation needs:    Medical: Not on file    Non-medical: Not on file  Tobacco Use  .  Smoking status: Current Every Day Smoker    Packs/day: 1.50    Types: Cigarettes  . Smokeless tobacco: Never Used  Substance and Sexual Activity  . Alcohol use: Yes    Alcohol/week: 0.0 oz    Comment: occ-twice a week beer approx 2  . Drug use: No  . Sexual activity: Not on file  Lifestyle  . Physical activity:    Days per week: Not on file    Minutes per session: Not on file  . Stress: Not on file  Relationships  . Social connections:    Talks on phone: Not on file    Gets together: Not on file    Attends religious service: Not on file    Active member of club or organization: Not on file    Attends meetings of clubs or organizations: Not on file    Relationship status: Not on file  . Intimate partner violence:    Fear of  current or ex partner: Not on file    Emotionally abused: Not on file    Physically abused: Not on file    Forced sexual activity: Not on file  Other Topics Concern  . Not on file  Social History Narrative  . Not on file       Objective:  Physical Exam  General: AAO x3, NAD  Dermatological: Nails appear to be hypertrophic, dystrophic, discolored with yellow-brown discoloration and they are elongated.  There is no surrounding redness or drainage or any signs of infection.  There is interdigital maceration of the right fourth interspace as well as on the plantar aspect of the fifth toe on the sulcus but there is no open sore drainage or pus.  Minimal hyperkeratotic tissue present also right submetatarsal 4.  No other open lesions or pre-ulcerative lesions identified.  Vascular: DP pulses 2/4, PT pulses 1/4.  CRT less than 3 seconds all the digits.  Neruologic: Sensation decreased with Dorann Ou monofilament  Musculoskeletal: Hammertoe contractures are present as well as hallux limitus.   Muscular strength 5/5 in all groups tested bilateral.  Gait: Unassisted, Nonantalgic.       Assessment:   67 year old male presents today for diabetic foot evaluation, symptomatic onychomycosis, requesting diabetic shoes     Plan:  -Treatment options discussed including all alternatives, risks, and complications -Etiology of symptoms were discussed -Nails debrided 10 without complications or bleeding. -Castellani's paint was applied to the macerated areas.  Also discussed the dry thoroughly between his toes. -Given his history of neuropathy, PAD, digital deformity ulceration he would benefit from diabetic shoes.  Paperwork completed today for precertification.  I will have him follow-up with [employer] for molding.  -Daily foot inspection -Follow-up in 3 months or sooner if any problems arise. In the meantime, encouraged to call the office with any questions, concerns, change in symptoms.    Ovid Curd, DPM

## 2017-05-17 DIAGNOSIS — E1129 Type 2 diabetes mellitus with other diabetic kidney complication: Secondary | ICD-10-CM | POA: Diagnosis not present

## 2017-05-21 ENCOUNTER — Other Ambulatory Visit: Payer: Medicare HMO

## 2017-05-31 DIAGNOSIS — E119 Type 2 diabetes mellitus without complications: Secondary | ICD-10-CM | POA: Diagnosis not present

## 2017-05-31 DIAGNOSIS — E7800 Pure hypercholesterolemia, unspecified: Secondary | ICD-10-CM | POA: Diagnosis not present

## 2017-06-13 DIAGNOSIS — L97909 Non-pressure chronic ulcer of unspecified part of unspecified lower leg with unspecified severity: Secondary | ICD-10-CM | POA: Diagnosis not present

## 2017-06-13 DIAGNOSIS — E11621 Type 2 diabetes mellitus with foot ulcer: Secondary | ICD-10-CM | POA: Diagnosis not present

## 2017-06-13 DIAGNOSIS — L97509 Non-pressure chronic ulcer of other part of unspecified foot with unspecified severity: Secondary | ICD-10-CM | POA: Diagnosis not present

## 2017-06-13 DIAGNOSIS — E1165 Type 2 diabetes mellitus with hyperglycemia: Secondary | ICD-10-CM | POA: Diagnosis not present

## 2017-06-13 DIAGNOSIS — I83009 Varicose veins of unspecified lower extremity with ulcer of unspecified site: Secondary | ICD-10-CM | POA: Diagnosis not present

## 2017-06-13 DIAGNOSIS — M869 Osteomyelitis, unspecified: Secondary | ICD-10-CM | POA: Diagnosis not present

## 2017-06-13 DIAGNOSIS — Z299 Encounter for prophylactic measures, unspecified: Secondary | ICD-10-CM | POA: Diagnosis not present

## 2017-06-13 DIAGNOSIS — J45909 Unspecified asthma, uncomplicated: Secondary | ICD-10-CM | POA: Diagnosis not present

## 2017-06-13 DIAGNOSIS — Z6841 Body Mass Index (BMI) 40.0 and over, adult: Secondary | ICD-10-CM | POA: Diagnosis not present

## 2017-06-13 DIAGNOSIS — E1142 Type 2 diabetes mellitus with diabetic polyneuropathy: Secondary | ICD-10-CM | POA: Diagnosis not present

## 2017-06-25 ENCOUNTER — Ambulatory Visit: Payer: Medicare HMO

## 2017-07-02 ENCOUNTER — Ambulatory Visit: Payer: Medicare HMO | Admitting: Orthotics

## 2017-07-02 DIAGNOSIS — B351 Tinea unguium: Secondary | ICD-10-CM

## 2017-07-02 DIAGNOSIS — M79674 Pain in right toe(s): Secondary | ICD-10-CM

## 2017-07-02 DIAGNOSIS — M79675 Pain in left toe(s): Secondary | ICD-10-CM

## 2017-07-02 NOTE — Progress Notes (Signed)
Replacing shoes one size longer.

## 2017-07-05 DIAGNOSIS — E7800 Pure hypercholesterolemia, unspecified: Secondary | ICD-10-CM | POA: Diagnosis not present

## 2017-07-05 DIAGNOSIS — E119 Type 2 diabetes mellitus without complications: Secondary | ICD-10-CM | POA: Diagnosis not present

## 2017-07-11 ENCOUNTER — Ambulatory Visit: Payer: Medicare HMO | Admitting: Orthotics

## 2017-07-11 ENCOUNTER — Ambulatory Visit (INDEPENDENT_AMBULATORY_CARE_PROVIDER_SITE_OTHER): Payer: Medicare HMO | Admitting: Orthotics

## 2017-07-11 DIAGNOSIS — E1151 Type 2 diabetes mellitus with diabetic peripheral angiopathy without gangrene: Secondary | ICD-10-CM

## 2017-07-11 DIAGNOSIS — M79675 Pain in left toe(s): Secondary | ICD-10-CM

## 2017-07-11 DIAGNOSIS — Z872 Personal history of diseases of the skin and subcutaneous tissue: Secondary | ICD-10-CM

## 2017-07-11 DIAGNOSIS — M2042 Other hammer toe(s) (acquired), left foot: Secondary | ICD-10-CM

## 2017-07-11 DIAGNOSIS — M2020 Hallux rigidus, unspecified foot: Secondary | ICD-10-CM

## 2017-07-11 DIAGNOSIS — M2041 Other hammer toe(s) (acquired), right foot: Secondary | ICD-10-CM | POA: Diagnosis not present

## 2017-07-11 DIAGNOSIS — M205X9 Other deformities of toe(s) (acquired), unspecified foot: Secondary | ICD-10-CM

## 2017-07-11 DIAGNOSIS — G629 Polyneuropathy, unspecified: Secondary | ICD-10-CM | POA: Diagnosis not present

## 2017-07-11 DIAGNOSIS — M79674 Pain in right toe(s): Secondary | ICD-10-CM

## 2017-07-11 NOTE — Progress Notes (Signed)

## 2017-07-15 DIAGNOSIS — E1142 Type 2 diabetes mellitus with diabetic polyneuropathy: Secondary | ICD-10-CM | POA: Diagnosis not present

## 2017-07-15 DIAGNOSIS — I83009 Varicose veins of unspecified lower extremity with ulcer of unspecified site: Secondary | ICD-10-CM | POA: Diagnosis not present

## 2017-07-15 DIAGNOSIS — E1165 Type 2 diabetes mellitus with hyperglycemia: Secondary | ICD-10-CM | POA: Diagnosis not present

## 2017-07-15 DIAGNOSIS — Z6839 Body mass index (BMI) 39.0-39.9, adult: Secondary | ICD-10-CM | POA: Diagnosis not present

## 2017-07-15 DIAGNOSIS — L97909 Non-pressure chronic ulcer of unspecified part of unspecified lower leg with unspecified severity: Secondary | ICD-10-CM | POA: Diagnosis not present

## 2017-07-15 DIAGNOSIS — Z299 Encounter for prophylactic measures, unspecified: Secondary | ICD-10-CM | POA: Diagnosis not present

## 2017-07-19 DIAGNOSIS — E1129 Type 2 diabetes mellitus with other diabetic kidney complication: Secondary | ICD-10-CM | POA: Diagnosis not present

## 2017-08-01 DIAGNOSIS — E7800 Pure hypercholesterolemia, unspecified: Secondary | ICD-10-CM | POA: Diagnosis not present

## 2017-08-01 DIAGNOSIS — E119 Type 2 diabetes mellitus without complications: Secondary | ICD-10-CM | POA: Diagnosis not present

## 2017-08-12 ENCOUNTER — Encounter: Payer: Self-pay | Admitting: Podiatry

## 2017-08-12 ENCOUNTER — Ambulatory Visit: Payer: Medicare HMO | Admitting: Podiatry

## 2017-08-12 DIAGNOSIS — M2012 Hallux valgus (acquired), left foot: Secondary | ICD-10-CM

## 2017-08-12 DIAGNOSIS — B351 Tinea unguium: Secondary | ICD-10-CM

## 2017-08-12 DIAGNOSIS — M79674 Pain in right toe(s): Secondary | ICD-10-CM | POA: Diagnosis not present

## 2017-08-12 DIAGNOSIS — G629 Polyneuropathy, unspecified: Secondary | ICD-10-CM

## 2017-08-12 DIAGNOSIS — M79675 Pain in left toe(s): Secondary | ICD-10-CM

## 2017-08-12 DIAGNOSIS — E1151 Type 2 diabetes mellitus with diabetic peripheral angiopathy without gangrene: Secondary | ICD-10-CM

## 2017-08-12 DIAGNOSIS — T148XXA Other injury of unspecified body region, initial encounter: Secondary | ICD-10-CM

## 2017-08-14 NOTE — Progress Notes (Signed)
Subjective: 67 y.o. returns the office today for painful, elongated, thickened toenails which he cannot trim himself. Denies any redness or drainage around the nails.  He also states that the bunion on the left side is been painful at times.  He did develop a blister on the right foot which did not heal.  Denies any drainage or pus or swelling.  Denies any acute changes since last appointment and no new complaints today. Denies any systemic complaints such as fevers, chills, nausea, vomiting.   PCP: Kirstie PeriShah, Ashish, MD   Objective: AAO 3, NAD DP/PT pulses palpable, CRT less than 3 seconds Protective sensation decreased with Simms Weinstein monofilament Nails hypertrophic, dystrophic, elongated, brittle, discolored 10. There is tenderness overlying the nails 1-5 bilaterally. There is no surrounding erythema or drainage along the nail sites. The dorsal aspect of the right forefoot there is an area of a blister that appears to have healed there is one very small pinpoint superficial opening present but there is no drainage or pus.  There is no surrounding erythema, ascending sialitis there is no fluctuation or crepitation or malodor.  Also bunion deformities present left side there is mild irritation from shoes but there is no skin breakdown. No open lesions or pre-ulcerative lesions are identified. No other areas of tenderness bilateral lower extremities. No overlying edema, erythema, increased warmth. No pain with calf compression, swelling, warmth, erythema.  Assessment: Patient presents with symptomatic onychomycosis; right foot blister, left HAV  Plan: -Treatment options including alternatives, risks, complications were discussed -Nails sharply debrided 10 without complication/bleeding. -Bunion pad was made for him to help offload the bunion. -Blister patient was completely healed.  Continue antibiotic ointment and a bandage daily.  Monitoring signs or symptoms of infection.  Nonhealing next  1 to 2 weeks to let me know or sooner if there is any issues.  Watch for any signs or symptoms of infection. -Discussed daily foot inspection. If there are any changes, to call the office immediately.  -Follow-up in 3 months or sooner if any problems are to arise. In the meantime, encouraged to call the office with any questions, concerns, changes symptoms.  Ovid CurdMatthew Missy Baksh, DPM

## 2017-08-15 DIAGNOSIS — E1129 Type 2 diabetes mellitus with other diabetic kidney complication: Secondary | ICD-10-CM | POA: Diagnosis not present

## 2017-08-30 DIAGNOSIS — E1165 Type 2 diabetes mellitus with hyperglycemia: Secondary | ICD-10-CM | POA: Diagnosis not present

## 2017-08-30 DIAGNOSIS — E1142 Type 2 diabetes mellitus with diabetic polyneuropathy: Secondary | ICD-10-CM | POA: Diagnosis not present

## 2017-08-30 DIAGNOSIS — I83009 Varicose veins of unspecified lower extremity with ulcer of unspecified site: Secondary | ICD-10-CM | POA: Diagnosis not present

## 2017-08-30 DIAGNOSIS — Z6839 Body mass index (BMI) 39.0-39.9, adult: Secondary | ICD-10-CM | POA: Diagnosis not present

## 2017-08-30 DIAGNOSIS — L97909 Non-pressure chronic ulcer of unspecified part of unspecified lower leg with unspecified severity: Secondary | ICD-10-CM | POA: Diagnosis not present

## 2017-08-30 DIAGNOSIS — Z299 Encounter for prophylactic measures, unspecified: Secondary | ICD-10-CM | POA: Diagnosis not present

## 2017-09-09 DIAGNOSIS — E7800 Pure hypercholesterolemia, unspecified: Secondary | ICD-10-CM | POA: Diagnosis not present

## 2017-09-09 DIAGNOSIS — E119 Type 2 diabetes mellitus without complications: Secondary | ICD-10-CM | POA: Diagnosis not present

## 2017-10-08 DIAGNOSIS — E7800 Pure hypercholesterolemia, unspecified: Secondary | ICD-10-CM | POA: Diagnosis not present

## 2017-10-08 DIAGNOSIS — E119 Type 2 diabetes mellitus without complications: Secondary | ICD-10-CM | POA: Diagnosis not present

## 2017-10-22 DIAGNOSIS — Z6841 Body Mass Index (BMI) 40.0 and over, adult: Secondary | ICD-10-CM | POA: Diagnosis not present

## 2017-10-22 DIAGNOSIS — Z2821 Immunization not carried out because of patient refusal: Secondary | ICD-10-CM | POA: Diagnosis not present

## 2017-10-22 DIAGNOSIS — E1165 Type 2 diabetes mellitus with hyperglycemia: Secondary | ICD-10-CM | POA: Diagnosis not present

## 2017-10-22 DIAGNOSIS — R69 Illness, unspecified: Secondary | ICD-10-CM | POA: Diagnosis not present

## 2017-10-22 DIAGNOSIS — Z299 Encounter for prophylactic measures, unspecified: Secondary | ICD-10-CM | POA: Diagnosis not present

## 2017-10-31 ENCOUNTER — Encounter: Payer: Self-pay | Admitting: Podiatry

## 2017-11-04 DIAGNOSIS — E7800 Pure hypercholesterolemia, unspecified: Secondary | ICD-10-CM | POA: Diagnosis not present

## 2017-11-04 DIAGNOSIS — E119 Type 2 diabetes mellitus without complications: Secondary | ICD-10-CM | POA: Diagnosis not present

## 2017-11-12 ENCOUNTER — Ambulatory Visit: Payer: Medicare HMO | Admitting: Podiatry

## 2017-11-13 ENCOUNTER — Encounter: Payer: Self-pay | Admitting: Podiatry

## 2017-11-13 ENCOUNTER — Ambulatory Visit: Payer: Medicare HMO | Admitting: Podiatry

## 2017-11-13 DIAGNOSIS — E11622 Type 2 diabetes mellitus with other skin ulcer: Secondary | ICD-10-CM | POA: Diagnosis not present

## 2017-11-13 DIAGNOSIS — M79675 Pain in left toe(s): Secondary | ICD-10-CM

## 2017-11-13 DIAGNOSIS — L98421 Non-pressure chronic ulcer of back limited to breakdown of skin: Secondary | ICD-10-CM

## 2017-11-13 DIAGNOSIS — M79674 Pain in right toe(s): Secondary | ICD-10-CM | POA: Diagnosis not present

## 2017-11-13 DIAGNOSIS — E1142 Type 2 diabetes mellitus with diabetic polyneuropathy: Secondary | ICD-10-CM | POA: Diagnosis not present

## 2017-11-13 DIAGNOSIS — B351 Tinea unguium: Secondary | ICD-10-CM

## 2017-11-13 DIAGNOSIS — E1129 Type 2 diabetes mellitus with other diabetic kidney complication: Secondary | ICD-10-CM | POA: Diagnosis not present

## 2017-11-13 NOTE — Patient Instructions (Signed)

## 2017-11-21 DIAGNOSIS — E119 Type 2 diabetes mellitus without complications: Secondary | ICD-10-CM | POA: Diagnosis not present

## 2017-11-21 DIAGNOSIS — H5200 Hypermetropia, unspecified eye: Secondary | ICD-10-CM | POA: Diagnosis not present

## 2017-12-01 ENCOUNTER — Encounter: Payer: Self-pay | Admitting: Podiatry

## 2017-12-01 NOTE — Progress Notes (Signed)
Subjective: Mitchell [name] presents today for diabetic foot care. He stateshe thinks he has a problem on his right foot because that was the area of concern on last visit.  He states he is not feeling the best because his wife and children have left him.   Objective: Vascular Examination: Capillary refill time <3 seconds x 10 digits Dorsalis pedis and Posterior tibial pulses present b/l No digital hair x 10 digits Skin temperature warm to cool b/l  Dermatological Examination: Skin with normal turgor, texture and tone b/l  Toenails  thick, dystrophic with subungual debris and pain with palpation to nailbeds due to thickness of nails.  Hyperkeratotic lesion noted plantar aspect of right foot with subdermal hemorrhage noted. No flocculence, no drainage, no erythema, no edema.  Musculoskeletal: Muscle strength 5/5 to all LE muscle groups  Neurological: Sensation diminished with 10 gram monofilament. Vibratory sensation diminished  Assessment: 1. Painful onychomycosis toenails 1-5 b/l 2. Preulcerative callus right foot 3. NIDDM with Diabetic neuropathy  Plan: 1. Preuclerative lesion debrided right foot. Patient advised to continue wearing diabetic shoes. 2. Continue diabetic foot care principles. Literature dispensed. 3. Toenails 1-5 b/l were debrided in length and girth without iatrogenic bleeding. 4. Patient to report any pedal injuries to medical professional  5. Follow up 6 weeks. Patient/POA to call should there be a concern in the interim.

## 2017-12-02 DIAGNOSIS — E7800 Pure hypercholesterolemia, unspecified: Secondary | ICD-10-CM | POA: Diagnosis not present

## 2017-12-02 DIAGNOSIS — E119 Type 2 diabetes mellitus without complications: Secondary | ICD-10-CM | POA: Diagnosis not present

## 2018-01-02 DIAGNOSIS — E119 Type 2 diabetes mellitus without complications: Secondary | ICD-10-CM | POA: Diagnosis not present

## 2018-01-02 DIAGNOSIS — E7800 Pure hypercholesterolemia, unspecified: Secondary | ICD-10-CM | POA: Diagnosis not present

## 2018-01-16 DIAGNOSIS — E1129 Type 2 diabetes mellitus with other diabetic kidney complication: Secondary | ICD-10-CM | POA: Diagnosis not present

## 2018-01-24 DIAGNOSIS — Z6841 Body Mass Index (BMI) 40.0 and over, adult: Secondary | ICD-10-CM | POA: Diagnosis not present

## 2018-01-24 DIAGNOSIS — E1165 Type 2 diabetes mellitus with hyperglycemia: Secondary | ICD-10-CM | POA: Diagnosis not present

## 2018-01-24 DIAGNOSIS — E11621 Type 2 diabetes mellitus with foot ulcer: Secondary | ICD-10-CM | POA: Diagnosis not present

## 2018-01-24 DIAGNOSIS — Z299 Encounter for prophylactic measures, unspecified: Secondary | ICD-10-CM | POA: Diagnosis not present

## 2018-01-24 DIAGNOSIS — L97509 Non-pressure chronic ulcer of other part of unspecified foot with unspecified severity: Secondary | ICD-10-CM | POA: Diagnosis not present

## 2018-01-24 DIAGNOSIS — R69 Illness, unspecified: Secondary | ICD-10-CM | POA: Diagnosis not present

## 2018-02-04 DIAGNOSIS — Z7982 Long term (current) use of aspirin: Secondary | ICD-10-CM | POA: Diagnosis not present

## 2018-02-04 DIAGNOSIS — R69 Illness, unspecified: Secondary | ICD-10-CM | POA: Diagnosis not present

## 2018-02-04 DIAGNOSIS — L97519 Non-pressure chronic ulcer of other part of right foot with unspecified severity: Secondary | ICD-10-CM | POA: Diagnosis not present

## 2018-02-04 DIAGNOSIS — L97518 Non-pressure chronic ulcer of other part of right foot with other specified severity: Secondary | ICD-10-CM | POA: Diagnosis not present

## 2018-02-04 DIAGNOSIS — Z79899 Other long term (current) drug therapy: Secondary | ICD-10-CM | POA: Diagnosis not present

## 2018-02-04 DIAGNOSIS — M19071 Primary osteoarthritis, right ankle and foot: Secondary | ICD-10-CM | POA: Diagnosis not present

## 2018-02-04 DIAGNOSIS — L97512 Non-pressure chronic ulcer of other part of right foot with fat layer exposed: Secondary | ICD-10-CM | POA: Diagnosis not present

## 2018-02-04 DIAGNOSIS — E11621 Type 2 diabetes mellitus with foot ulcer: Secondary | ICD-10-CM | POA: Diagnosis not present

## 2018-02-07 DIAGNOSIS — L97518 Non-pressure chronic ulcer of other part of right foot with other specified severity: Secondary | ICD-10-CM | POA: Diagnosis not present

## 2018-02-07 DIAGNOSIS — Z79899 Other long term (current) drug therapy: Secondary | ICD-10-CM | POA: Diagnosis not present

## 2018-02-07 DIAGNOSIS — E11621 Type 2 diabetes mellitus with foot ulcer: Secondary | ICD-10-CM | POA: Diagnosis not present

## 2018-02-07 DIAGNOSIS — Z7982 Long term (current) use of aspirin: Secondary | ICD-10-CM | POA: Diagnosis not present

## 2018-02-07 DIAGNOSIS — M19071 Primary osteoarthritis, right ankle and foot: Secondary | ICD-10-CM | POA: Diagnosis not present

## 2018-02-07 DIAGNOSIS — R69 Illness, unspecified: Secondary | ICD-10-CM | POA: Diagnosis not present

## 2018-02-10 DIAGNOSIS — R69 Illness, unspecified: Secondary | ICD-10-CM | POA: Diagnosis not present

## 2018-02-10 DIAGNOSIS — M19071 Primary osteoarthritis, right ankle and foot: Secondary | ICD-10-CM | POA: Diagnosis not present

## 2018-02-10 DIAGNOSIS — Z79899 Other long term (current) drug therapy: Secondary | ICD-10-CM | POA: Diagnosis not present

## 2018-02-10 DIAGNOSIS — E11621 Type 2 diabetes mellitus with foot ulcer: Secondary | ICD-10-CM | POA: Diagnosis not present

## 2018-02-10 DIAGNOSIS — Z7982 Long term (current) use of aspirin: Secondary | ICD-10-CM | POA: Diagnosis not present

## 2018-02-10 DIAGNOSIS — L97518 Non-pressure chronic ulcer of other part of right foot with other specified severity: Secondary | ICD-10-CM | POA: Diagnosis not present

## 2018-02-11 DIAGNOSIS — L97518 Non-pressure chronic ulcer of other part of right foot with other specified severity: Secondary | ICD-10-CM | POA: Diagnosis not present

## 2018-02-11 DIAGNOSIS — L97512 Non-pressure chronic ulcer of other part of right foot with fat layer exposed: Secondary | ICD-10-CM | POA: Diagnosis not present

## 2018-02-11 DIAGNOSIS — E1129 Type 2 diabetes mellitus with other diabetic kidney complication: Secondary | ICD-10-CM | POA: Diagnosis not present

## 2018-02-11 DIAGNOSIS — E11621 Type 2 diabetes mellitus with foot ulcer: Secondary | ICD-10-CM | POA: Diagnosis not present

## 2018-02-11 DIAGNOSIS — Z79899 Other long term (current) drug therapy: Secondary | ICD-10-CM | POA: Diagnosis not present

## 2018-02-11 DIAGNOSIS — Z7982 Long term (current) use of aspirin: Secondary | ICD-10-CM | POA: Diagnosis not present

## 2018-02-11 DIAGNOSIS — R69 Illness, unspecified: Secondary | ICD-10-CM | POA: Diagnosis not present

## 2018-02-11 DIAGNOSIS — M19071 Primary osteoarthritis, right ankle and foot: Secondary | ICD-10-CM | POA: Diagnosis not present

## 2018-02-12 ENCOUNTER — Ambulatory Visit: Payer: Medicare HMO | Admitting: Podiatry

## 2018-02-13 ENCOUNTER — Ambulatory Visit: Payer: Medicare HMO | Admitting: Podiatry

## 2018-02-14 DIAGNOSIS — R69 Illness, unspecified: Secondary | ICD-10-CM | POA: Diagnosis not present

## 2018-02-14 DIAGNOSIS — Z79899 Other long term (current) drug therapy: Secondary | ICD-10-CM | POA: Diagnosis not present

## 2018-02-14 DIAGNOSIS — M19071 Primary osteoarthritis, right ankle and foot: Secondary | ICD-10-CM | POA: Diagnosis not present

## 2018-02-14 DIAGNOSIS — Z7982 Long term (current) use of aspirin: Secondary | ICD-10-CM | POA: Diagnosis not present

## 2018-02-14 DIAGNOSIS — L97518 Non-pressure chronic ulcer of other part of right foot with other specified severity: Secondary | ICD-10-CM | POA: Diagnosis not present

## 2018-02-14 DIAGNOSIS — E11621 Type 2 diabetes mellitus with foot ulcer: Secondary | ICD-10-CM | POA: Diagnosis not present

## 2018-02-17 DIAGNOSIS — L97518 Non-pressure chronic ulcer of other part of right foot with other specified severity: Secondary | ICD-10-CM | POA: Diagnosis not present

## 2018-02-17 DIAGNOSIS — E11621 Type 2 diabetes mellitus with foot ulcer: Secondary | ICD-10-CM | POA: Diagnosis not present

## 2018-02-19 DIAGNOSIS — E11621 Type 2 diabetes mellitus with foot ulcer: Secondary | ICD-10-CM | POA: Diagnosis not present

## 2018-02-19 DIAGNOSIS — L97518 Non-pressure chronic ulcer of other part of right foot with other specified severity: Secondary | ICD-10-CM | POA: Diagnosis not present

## 2018-02-21 DIAGNOSIS — L97518 Non-pressure chronic ulcer of other part of right foot with other specified severity: Secondary | ICD-10-CM | POA: Diagnosis not present

## 2018-02-21 DIAGNOSIS — E11621 Type 2 diabetes mellitus with foot ulcer: Secondary | ICD-10-CM | POA: Diagnosis not present

## 2018-02-24 DIAGNOSIS — L97518 Non-pressure chronic ulcer of other part of right foot with other specified severity: Secondary | ICD-10-CM | POA: Diagnosis not present

## 2018-02-24 DIAGNOSIS — E11621 Type 2 diabetes mellitus with foot ulcer: Secondary | ICD-10-CM | POA: Diagnosis not present

## 2018-02-26 DIAGNOSIS — L97518 Non-pressure chronic ulcer of other part of right foot with other specified severity: Secondary | ICD-10-CM | POA: Diagnosis not present

## 2018-02-26 DIAGNOSIS — E11621 Type 2 diabetes mellitus with foot ulcer: Secondary | ICD-10-CM | POA: Diagnosis not present

## 2018-02-28 DIAGNOSIS — L97518 Non-pressure chronic ulcer of other part of right foot with other specified severity: Secondary | ICD-10-CM | POA: Diagnosis not present

## 2018-02-28 DIAGNOSIS — L97512 Non-pressure chronic ulcer of other part of right foot with fat layer exposed: Secondary | ICD-10-CM | POA: Diagnosis not present

## 2018-02-28 DIAGNOSIS — E11621 Type 2 diabetes mellitus with foot ulcer: Secondary | ICD-10-CM | POA: Diagnosis not present

## 2018-03-03 DIAGNOSIS — E11621 Type 2 diabetes mellitus with foot ulcer: Secondary | ICD-10-CM | POA: Diagnosis not present

## 2018-03-03 DIAGNOSIS — L97518 Non-pressure chronic ulcer of other part of right foot with other specified severity: Secondary | ICD-10-CM | POA: Diagnosis not present

## 2018-03-05 DIAGNOSIS — E11621 Type 2 diabetes mellitus with foot ulcer: Secondary | ICD-10-CM | POA: Diagnosis not present

## 2018-03-05 DIAGNOSIS — L97518 Non-pressure chronic ulcer of other part of right foot with other specified severity: Secondary | ICD-10-CM | POA: Diagnosis not present

## 2018-03-07 DIAGNOSIS — L97513 Non-pressure chronic ulcer of other part of right foot with necrosis of muscle: Secondary | ICD-10-CM | POA: Diagnosis not present

## 2018-03-07 DIAGNOSIS — E11621 Type 2 diabetes mellitus with foot ulcer: Secondary | ICD-10-CM | POA: Diagnosis not present

## 2018-03-07 DIAGNOSIS — L97518 Non-pressure chronic ulcer of other part of right foot with other specified severity: Secondary | ICD-10-CM | POA: Diagnosis not present

## 2018-03-10 DIAGNOSIS — E11621 Type 2 diabetes mellitus with foot ulcer: Secondary | ICD-10-CM | POA: Diagnosis not present

## 2018-03-10 DIAGNOSIS — L97518 Non-pressure chronic ulcer of other part of right foot with other specified severity: Secondary | ICD-10-CM | POA: Diagnosis not present

## 2018-03-11 ENCOUNTER — Ambulatory Visit: Payer: Medicare HMO | Admitting: Podiatry

## 2018-03-12 DIAGNOSIS — E11621 Type 2 diabetes mellitus with foot ulcer: Secondary | ICD-10-CM | POA: Diagnosis not present

## 2018-03-12 DIAGNOSIS — L97518 Non-pressure chronic ulcer of other part of right foot with other specified severity: Secondary | ICD-10-CM | POA: Diagnosis not present

## 2018-03-14 DIAGNOSIS — L97518 Non-pressure chronic ulcer of other part of right foot with other specified severity: Secondary | ICD-10-CM | POA: Diagnosis not present

## 2018-03-14 DIAGNOSIS — E11621 Type 2 diabetes mellitus with foot ulcer: Secondary | ICD-10-CM | POA: Diagnosis not present

## 2018-03-19 DIAGNOSIS — E11621 Type 2 diabetes mellitus with foot ulcer: Secondary | ICD-10-CM | POA: Diagnosis not present

## 2018-03-19 DIAGNOSIS — L97518 Non-pressure chronic ulcer of other part of right foot with other specified severity: Secondary | ICD-10-CM | POA: Diagnosis not present

## 2018-03-20 ENCOUNTER — Ambulatory Visit: Payer: Medicare HMO | Admitting: Podiatry

## 2018-03-21 DIAGNOSIS — E11621 Type 2 diabetes mellitus with foot ulcer: Secondary | ICD-10-CM | POA: Diagnosis not present

## 2018-03-21 DIAGNOSIS — L97518 Non-pressure chronic ulcer of other part of right foot with other specified severity: Secondary | ICD-10-CM | POA: Diagnosis not present

## 2018-03-25 DIAGNOSIS — L97513 Non-pressure chronic ulcer of other part of right foot with necrosis of muscle: Secondary | ICD-10-CM | POA: Diagnosis not present

## 2018-03-25 DIAGNOSIS — L97518 Non-pressure chronic ulcer of other part of right foot with other specified severity: Secondary | ICD-10-CM | POA: Diagnosis not present

## 2018-03-25 DIAGNOSIS — E11621 Type 2 diabetes mellitus with foot ulcer: Secondary | ICD-10-CM | POA: Diagnosis not present

## 2018-03-28 DIAGNOSIS — L97518 Non-pressure chronic ulcer of other part of right foot with other specified severity: Secondary | ICD-10-CM | POA: Diagnosis not present

## 2018-03-28 DIAGNOSIS — E11621 Type 2 diabetes mellitus with foot ulcer: Secondary | ICD-10-CM | POA: Diagnosis not present

## 2018-03-31 DIAGNOSIS — L97518 Non-pressure chronic ulcer of other part of right foot with other specified severity: Secondary | ICD-10-CM | POA: Diagnosis not present

## 2018-03-31 DIAGNOSIS — E11621 Type 2 diabetes mellitus with foot ulcer: Secondary | ICD-10-CM | POA: Diagnosis not present

## 2018-04-02 DIAGNOSIS — L97518 Non-pressure chronic ulcer of other part of right foot with other specified severity: Secondary | ICD-10-CM | POA: Diagnosis not present

## 2018-04-02 DIAGNOSIS — E11621 Type 2 diabetes mellitus with foot ulcer: Secondary | ICD-10-CM | POA: Diagnosis not present

## 2018-04-04 DIAGNOSIS — E11621 Type 2 diabetes mellitus with foot ulcer: Secondary | ICD-10-CM | POA: Diagnosis not present

## 2018-04-04 DIAGNOSIS — L97518 Non-pressure chronic ulcer of other part of right foot with other specified severity: Secondary | ICD-10-CM | POA: Diagnosis not present

## 2018-04-08 DIAGNOSIS — E11621 Type 2 diabetes mellitus with foot ulcer: Secondary | ICD-10-CM | POA: Diagnosis not present

## 2018-04-08 DIAGNOSIS — L97518 Non-pressure chronic ulcer of other part of right foot with other specified severity: Secondary | ICD-10-CM | POA: Diagnosis not present

## 2018-04-08 DIAGNOSIS — L97519 Non-pressure chronic ulcer of other part of right foot with unspecified severity: Secondary | ICD-10-CM | POA: Diagnosis not present

## 2018-04-09 DIAGNOSIS — E7800 Pure hypercholesterolemia, unspecified: Secondary | ICD-10-CM | POA: Diagnosis not present

## 2018-04-09 DIAGNOSIS — E119 Type 2 diabetes mellitus without complications: Secondary | ICD-10-CM | POA: Diagnosis not present

## 2018-04-15 DIAGNOSIS — E1129 Type 2 diabetes mellitus with other diabetic kidney complication: Secondary | ICD-10-CM | POA: Diagnosis not present

## 2018-04-16 ENCOUNTER — Other Ambulatory Visit: Payer: Self-pay

## 2018-04-16 ENCOUNTER — Ambulatory Visit: Payer: Medicare HMO | Admitting: Podiatry

## 2018-04-16 VITALS — Temp 98.1°F

## 2018-04-16 DIAGNOSIS — B351 Tinea unguium: Secondary | ICD-10-CM

## 2018-04-16 DIAGNOSIS — L84 Corns and callosities: Secondary | ICD-10-CM

## 2018-04-16 DIAGNOSIS — M79674 Pain in right toe(s): Secondary | ICD-10-CM

## 2018-04-16 DIAGNOSIS — E1142 Type 2 diabetes mellitus with diabetic polyneuropathy: Secondary | ICD-10-CM | POA: Diagnosis not present

## 2018-04-16 DIAGNOSIS — M79675 Pain in left toe(s): Secondary | ICD-10-CM | POA: Diagnosis not present

## 2018-04-20 ENCOUNTER — Encounter: Payer: Self-pay | Admitting: Podiatry

## 2018-04-20 DIAGNOSIS — L84 Corns and callosities: Secondary | ICD-10-CM | POA: Insufficient documentation

## 2018-04-20 NOTE — Progress Notes (Signed)
Subjective: Mitchell [name] presents for preventative diabetic foot care on today. He relates he was just released from [facility]ound Care clinic for ulceration plantar aspect of right foot and was told to schedule appointment with our clinic.   He states he has 2 pair of diabetic shoes and thinks he has already received a pair for this year. He is wearing one pair of them on today.   Kirstie Peri, MD is his PCP.   Current Outpatient Medications:  .  aspirin EC 81 MG tablet, Take 81 mg by mouth daily., Disp: , Rfl:  .  canagliflozin (INVOKANA) 300 MG TABS tablet, Take 300 mg by mouth daily before breakfast., Disp: , Rfl:  .  furosemide (LASIX) 20 MG tablet, Take 1 tablet (20 mg total) by mouth daily., Disp: 30 tablet, Rfl: 0 .  gabapentin (NEURONTIN) 300 MG capsule, Take 1 capsule (300 mg total) by mouth 3 (three) times daily., Disp: 90 capsule, Rfl: 0 .  lovastatin (MEVACOR) 20 MG tablet, Take 1 tablet (20 mg total) by mouth at bedtime., Disp: 30 tablet, Rfl: 0 .  metFORMIN (GLUCOPHAGE) 500 MG tablet, Take 1,000 mg by mouth 2 (two) times daily., Disp: , Rfl:  .  TRESIBA FLEXTOUCH 200 UNIT/ML SOPN, Inject 55 Units into the skin daily., Disp: , Rfl:   Allergies  Allergen Reactions  . Penicillins Itching and Rash    Has patient had a PCN reaction causing immediate rash, facial/tongue/throat swelling, SOB or lightheadedness with hypotension: Yes Has patient had a PCN reaction causing severe rash involving mucus membranes or skin necrosis: No Has patient had a PCN reaction that required hospitalization No Has patient had a PCN reaction occurring within the last 10 years: No If all of the above answers are "NO", then may proceed with Cephalosporin use.     Vascular Examination: Capillary refill time <3 seconds x 10 digits.  Dorsalis pedis pulses palpable b/l.  Posterior tibial pulses palpable b/l.  Dgital hair absent x 10 digits.  Skin temperature gradient warm to cool b/l.  Dermatological  Examination: Skin with normal turgor, texture and tone b/l.  Toenails 1-5 b/l discolored, thick, dystrophic with subungual debris and pain with palpation to nailbeds due to thickness of nails.  Hyperkeratotic lesion submet head 4 right foot, submet head 5 left foot. No erythema, no edema, no drainage, no flocculence noted. No underlying wounds.  Musculoskeletal: Muscle strength 5/5 to all LE muscle groups  Shoe Evaluation: He has worn plastizote inserts b/l feet. Unsure if these have ever been changed since he received his shoes in June.   Neurological: Sensation diminished with 10 gram monofilament. Vibratory sensation diminished  Assessment: 1. Painful onychomycosis toenails 1-5 b/l 2. Callusessubmet head 4 right foot, submet head 5 left foot 3. NIDDM with Diabetic neuropathy  Plan: 1. Continue diabetic foot care principles. Literature dispensed on today. 2. Toenails 1-5 b/l were debrided in length and girth without iatrogenic bleeding. 3. Calluses pared submetatarsal head(s) 4 right foot, submet head 5 left foot tutilizing sterile scalpel blade without incident.  4. Offloaded area of previous ulceration right foot. Patient to continue soft, supportive shoe gear. Explained to him his insoles do wear out over time and he needs to change insoles every 4 months. We will have him measured/casted for new insoles/shoes on next visit. Both Pedorthist's  and my visit will be same day to assist him financially co-pay wise. 5. Patient to report any pedal injuries to medical professional immediately. 6. Follow up 1 month so  we can keep a close eye on him. He is to call office should he have any questions/concerns. 7. Patient/POA to call should there be a concern in the interim.

## 2018-05-12 DIAGNOSIS — E1129 Type 2 diabetes mellitus with other diabetic kidney complication: Secondary | ICD-10-CM | POA: Diagnosis not present

## 2018-05-14 DIAGNOSIS — Z1331 Encounter for screening for depression: Secondary | ICD-10-CM | POA: Diagnosis not present

## 2018-05-14 DIAGNOSIS — Z7189 Other specified counseling: Secondary | ICD-10-CM | POA: Diagnosis not present

## 2018-05-14 DIAGNOSIS — R5383 Other fatigue: Secondary | ICD-10-CM | POA: Diagnosis not present

## 2018-05-14 DIAGNOSIS — Z299 Encounter for prophylactic measures, unspecified: Secondary | ICD-10-CM | POA: Diagnosis not present

## 2018-05-14 DIAGNOSIS — E1165 Type 2 diabetes mellitus with hyperglycemia: Secondary | ICD-10-CM | POA: Diagnosis not present

## 2018-05-14 DIAGNOSIS — R69 Illness, unspecified: Secondary | ICD-10-CM | POA: Diagnosis not present

## 2018-05-14 DIAGNOSIS — Z0000 Encounter for general adult medical examination without abnormal findings: Secondary | ICD-10-CM | POA: Diagnosis not present

## 2018-05-14 DIAGNOSIS — Z6841 Body Mass Index (BMI) 40.0 and over, adult: Secondary | ICD-10-CM | POA: Diagnosis not present

## 2018-05-14 DIAGNOSIS — Z1339 Encounter for screening examination for other mental health and behavioral disorders: Secondary | ICD-10-CM | POA: Diagnosis not present

## 2018-05-14 DIAGNOSIS — Z79899 Other long term (current) drug therapy: Secondary | ICD-10-CM | POA: Diagnosis not present

## 2018-05-14 DIAGNOSIS — Z125 Encounter for screening for malignant neoplasm of prostate: Secondary | ICD-10-CM | POA: Diagnosis not present

## 2018-05-14 DIAGNOSIS — E7800 Pure hypercholesterolemia, unspecified: Secondary | ICD-10-CM | POA: Diagnosis not present

## 2018-05-14 DIAGNOSIS — Z1211 Encounter for screening for malignant neoplasm of colon: Secondary | ICD-10-CM | POA: Diagnosis not present

## 2018-05-15 ENCOUNTER — Ambulatory Visit: Payer: Medicare HMO | Admitting: Podiatry

## 2018-05-15 ENCOUNTER — Encounter: Payer: Self-pay | Admitting: Podiatry

## 2018-05-15 ENCOUNTER — Other Ambulatory Visit: Payer: Medicare HMO | Admitting: Orthotics

## 2018-05-15 ENCOUNTER — Other Ambulatory Visit: Payer: Self-pay

## 2018-05-15 VITALS — Temp 97.5°F

## 2018-05-15 DIAGNOSIS — S99821A Other specified injuries of right foot, initial encounter: Secondary | ICD-10-CM | POA: Diagnosis not present

## 2018-05-15 DIAGNOSIS — E1142 Type 2 diabetes mellitus with diabetic polyneuropathy: Secondary | ICD-10-CM

## 2018-05-15 NOTE — Patient Instructions (Signed)
Diabetes Mellitus and Foot Care  Foot care is an important part of your health, especially when you have diabetes. Diabetes may cause you to have problems because of poor blood flow (circulation) to your feet and legs, which can cause your skin to:   Become thinner and drier.   Break more easily.   Heal more slowly.   Peel and crack.  You may also have nerve damage (neuropathy) in your legs and feet, causing decreased feeling in them. This means that you may not notice minor injuries to your feet that could lead to more serious problems. Noticing and addressing any potential problems early is the best way to prevent future foot problems.  How to care for your feet  Foot hygiene   Wash your feet daily with warm water and mild soap. Do not use hot water. Then, pat your feet and the areas between your toes until they are completely dry. Do not soak your feet as this can dry your skin.   Trim your toenails straight across. Do not dig under them or around the cuticle. File the edges of your nails with an emery board or nail file.   Apply a moisturizing lotion or petroleum jelly to the skin on your feet and to dry, brittle toenails. Use lotion that does not contain alcohol and is unscented. Do not apply lotion between your toes.  Shoes and socks   Wear clean socks or stockings every day. Make sure they are not too tight. Do not wear knee-high stockings since they may decrease blood flow to your legs.   Wear shoes that fit properly and have enough cushioning. Always look in your shoes before you put them on to be sure there are no objects inside.   To break in new shoes, wear them for just a few hours a day. This prevents injuries on your feet.  Wounds, scrapes, corns, and calluses   Check your feet daily for blisters, cuts, bruises, sores, and redness. If you cannot see the bottom of your feet, use a mirror or ask someone for help.   Do not cut corns or calluses or try to remove them with medicine.   If you  find a minor scrape, cut, or break in the skin on your feet, keep it and the skin around it clean and dry. You may clean these areas with mild soap and water. Do not clean the area with peroxide, alcohol, or iodine.   If you have a wound, scrape, corn, or callus on your foot, look at it several times a day to make sure it is healing and not infected. Check for:  ? Redness, swelling, or pain.  ? Fluid or blood.  ? Warmth.  ? Pus or a bad smell.  General instructions   Do not cross your legs. This may decrease blood flow to your feet.   Do not use heating pads or hot water bottles on your feet. They may burn your skin. If you have lost feeling in your feet or legs, you may not know this is happening until it is too late.   Protect your feet from hot and cold by wearing shoes, such as at the beach or on hot pavement.   Schedule a complete foot exam at least once a year (annually) or more often if you have foot problems. If you have foot problems, report any cuts, sores, or bruises to your health care provider immediately.  Contact a health care provider if:     You have a medical condition that increases your risk of infection and you have any cuts, sores, or bruises on your feet.   You have an injury that is not healing.   You have redness on your legs or feet.   You feel burning or tingling in your legs or feet.   You have pain or cramps in your legs and feet.   Your legs or feet are numb.   Your feet always feel cold.   You have pain around a toenail.  Get help right away if:   You have a wound, scrape, corn, or callus on your foot and:  ? You have pain, swelling, or redness that gets worse.  ? You have fluid or blood coming from the wound, scrape, corn, or callus.  ? Your wound, scrape, corn, or callus feels warm to the touch.  ? You have pus or a bad smell coming from the wound, scrape, corn, or callus.  ? You have a fever.  ? You have a red line going up your leg.  Summary   Check your feet every day  for cuts, sores, red spots, swelling, and blisters.   Moisturize feet and legs daily.   Wear shoes that fit properly and have enough cushioning.   If you have foot problems, report any cuts, sores, or bruises to your health care provider immediately.   Schedule a complete foot exam at least once a year (annually) or more often if you have foot problems.  This information is not intended to replace advice given to you by your health care provider. Make sure you discuss any questions you have with your health care provider.  Document Released: 12/30/1999 Document Revised: 02/13/2017 Document Reviewed: 02/03/2016  [employer] Interactive Patient Education  2019 [employer]

## 2018-05-22 ENCOUNTER — Encounter: Payer: Self-pay | Admitting: Podiatry

## 2018-05-22 NOTE — Progress Notes (Addendum)
Subjective: Mitchell [name] presents with h/o diabetes, diabetic neuropathy history of foot ulcer. He is seen for routine diabetic foot care.   He is here for follow up ulceration submet head 4 right foot.  He is also scheduled to be measured for new diabetic shoes on today's visit.   Kirstie Peri, MD is his PCP. Last visit was 05/14/2018.   Current Outpatient Medications:  .  aspirin EC 81 MG tablet, Take 81 mg by mouth daily., Disp: , Rfl:  .  canagliflozin (INVOKANA) 300 MG TABS tablet, Take 300 mg by mouth daily before breakfast., Disp: , Rfl:  .  furosemide (LASIX) 20 MG tablet, Take 1 tablet (20 mg total) by mouth daily., Disp: 30 tablet, Rfl: 0 .  gabapentin (NEURONTIN) 300 MG capsule, Take 1 capsule (300 mg total) by mouth 3 (three) times daily., Disp: 90 capsule, Rfl: 0 .  JARDIANCE 25 MG TABS tablet, , Disp: , Rfl:  .  lovastatin (MEVACOR) 20 MG tablet, Take 1 tablet (20 mg total) by mouth at bedtime., Disp: 30 tablet, Rfl: 0 .  metFORMIN (GLUCOPHAGE) 500 MG tablet, Take 1,000 mg by mouth 2 (two) times daily., Disp: , Rfl:  .  sulfamethoxazole-trimethoprim (BACTRIM DS) 800-160 MG tablet, Take 1 tablet by mouth 2 (two) times daily., Disp: , Rfl:  .  TRESIBA FLEXTOUCH 200 UNIT/ML SOPN, Inject 55 Units into the skin daily., Disp: , Rfl:  .  triamcinolone cream (KENALOG) 0.1 %, APPLY CREAM EXTERNALLY ONCE DAILY AS DIRECTED, Disp: , Rfl:   Allergies  Allergen Reactions  . Penicillins Itching and Rash    Has patient had a PCN reaction causing immediate rash, facial/tongue/throat swelling, SOB or lightheadedness with hypotension: Yes Has patient had a PCN reaction causing severe rash involving mucus membranes or skin necrosis: No Has patient had a PCN reaction that required hospitalization No Has patient had a PCN reaction occurring within the last 10 years: No If all of the above answers are "NO", then may proceed with Cephalosporin use.     Vascular Examination: Capillary refill  time <3 seconds x 10 digits.  Dorsalis pedis pulses palpable b/l.  Posterior tibial pulses palpable b/l.  Digital hair absent x 10 digits.  Skin temperature gradient warm to cool b/l.  Dermatological Examination: Skin with normal turgor, texture and tone b/l.  Toenails 1-5 b/l mycotic with evidence of recent trimming.   Right great toe nailplate with evidence of subungual seroma noted medial border. Evidenced by onycholytic nailplate and clear, serous drainage followed by light bleeding.  Has h/o hyperkeratotic lesions submetatarsal head 4 right foot, submetatarsal head 5 left foot. Both lesions pared on last visit. No erythema, no edema, no drainage, no flocculence noted.  Musculoskeletal: Muscle strength 5/5 to all LE muscle groups  Neurological: Sensation diminished with 10 gram monofilament.  Vibratory sensation diminished  Assessment: 1. Subungual seroma right hallux 2. NIDDM with Diabetic neuropathy  Plan: 1. Continue diabetic foot care principles. Literature dispensed on today. 2. Subungual seroma treated with Lumicain Hemostatic solution. Digit cleansed with alcohol. Povidine ointment and band-aid applied. Patient instructed to apply Neosporin ointment to digit once daily for 7 days. Call office if he experiences any problems.  3. Patient to continue soft, supportive shoe gear daily. He will be fitted for new diabetic shoes and custom inserts on today. 4. Patient to report any pedal injuries to medical professional immediately. 5. Follow up 8 weeks for preventative diabetic foot care. 6. Patient/POA to call should there be a concern  in the interim.

## 2018-05-28 DIAGNOSIS — R69 Illness, unspecified: Secondary | ICD-10-CM | POA: Diagnosis not present

## 2018-05-28 DIAGNOSIS — E1165 Type 2 diabetes mellitus with hyperglycemia: Secondary | ICD-10-CM | POA: Diagnosis not present

## 2018-05-28 DIAGNOSIS — E1142 Type 2 diabetes mellitus with diabetic polyneuropathy: Secondary | ICD-10-CM | POA: Diagnosis not present

## 2018-05-28 DIAGNOSIS — Z6841 Body Mass Index (BMI) 40.0 and over, adult: Secondary | ICD-10-CM | POA: Diagnosis not present

## 2018-05-28 DIAGNOSIS — L84 Corns and callosities: Secondary | ICD-10-CM | POA: Diagnosis not present

## 2018-05-28 DIAGNOSIS — Z299 Encounter for prophylactic measures, unspecified: Secondary | ICD-10-CM | POA: Diagnosis not present

## 2018-06-03 DIAGNOSIS — E119 Type 2 diabetes mellitus without complications: Secondary | ICD-10-CM | POA: Diagnosis not present

## 2018-06-03 DIAGNOSIS — E7800 Pure hypercholesterolemia, unspecified: Secondary | ICD-10-CM | POA: Diagnosis not present

## 2018-07-01 DIAGNOSIS — E7800 Pure hypercholesterolemia, unspecified: Secondary | ICD-10-CM | POA: Diagnosis not present

## 2018-07-01 DIAGNOSIS — E119 Type 2 diabetes mellitus without complications: Secondary | ICD-10-CM | POA: Diagnosis not present

## 2018-07-03 ENCOUNTER — Ambulatory Visit (INDEPENDENT_AMBULATORY_CARE_PROVIDER_SITE_OTHER): Payer: Medicare HMO | Admitting: Orthotics

## 2018-07-03 ENCOUNTER — Other Ambulatory Visit: Payer: Self-pay

## 2018-07-03 DIAGNOSIS — L84 Corns and callosities: Secondary | ICD-10-CM

## 2018-07-03 DIAGNOSIS — E11622 Type 2 diabetes mellitus with other skin ulcer: Secondary | ICD-10-CM

## 2018-07-03 DIAGNOSIS — E1142 Type 2 diabetes mellitus with diabetic polyneuropathy: Secondary | ICD-10-CM | POA: Diagnosis not present

## 2018-07-03 DIAGNOSIS — L98421 Non-pressure chronic ulcer of back limited to breakdown of skin: Secondary | ICD-10-CM

## 2018-07-03 DIAGNOSIS — M79675 Pain in left toe(s): Secondary | ICD-10-CM

## 2018-07-03 DIAGNOSIS — B351 Tinea unguium: Secondary | ICD-10-CM

## 2018-07-03 DIAGNOSIS — M79674 Pain in right toe(s): Secondary | ICD-10-CM

## 2018-07-03 NOTE — Progress Notes (Signed)

## 2018-07-14 DIAGNOSIS — E1129 Type 2 diabetes mellitus with other diabetic kidney complication: Secondary | ICD-10-CM | POA: Diagnosis not present

## 2018-07-16 ENCOUNTER — Encounter: Payer: Self-pay | Admitting: Podiatry

## 2018-07-16 ENCOUNTER — Other Ambulatory Visit: Payer: Self-pay

## 2018-07-16 ENCOUNTER — Ambulatory Visit (INDEPENDENT_AMBULATORY_CARE_PROVIDER_SITE_OTHER): Payer: Medicare HMO | Admitting: Podiatry

## 2018-07-16 VITALS — Temp 97.2°F

## 2018-07-16 DIAGNOSIS — B351 Tinea unguium: Secondary | ICD-10-CM

## 2018-07-16 DIAGNOSIS — M79675 Pain in left toe(s): Secondary | ICD-10-CM

## 2018-07-16 DIAGNOSIS — L84 Corns and callosities: Secondary | ICD-10-CM

## 2018-07-16 DIAGNOSIS — E1142 Type 2 diabetes mellitus with diabetic polyneuropathy: Secondary | ICD-10-CM

## 2018-07-16 DIAGNOSIS — M79674 Pain in right toe(s): Secondary | ICD-10-CM | POA: Diagnosis not present

## 2018-07-16 NOTE — Patient Instructions (Signed)

## 2018-07-20 NOTE — Progress Notes (Signed)
Subjective: Mitchell [name] presents today for preventative diabetic foot care with history of neuropathy. Patient seen for follow up of mycotic toenails and calluses which pose a risk due to his diabetes and neuropathy.  He has received diabetic shoes and notes no issues with them.  He is seen [facility] at [facility] for bilateral LE ulcerations and gets leg wraps periodically.   Mitchell Blitz, MD is his PCP and last visit was 3-4 weeks ago per patient recall.   Current Outpatient Medications:  .  aspirin EC 81 MG tablet, Take 81 mg by mouth daily., Disp: , Rfl:  .  canagliflozin (INVOKANA) 300 MG TABS tablet, Take 300 mg by mouth daily before breakfast., Disp: , Rfl:  .  furosemide (LASIX) 20 MG tablet, Take 1 tablet (20 mg total) by mouth daily., Disp: 30 tablet, Rfl: 0 .  gabapentin (NEURONTIN) 300 MG capsule, Take 1 capsule (300 mg total) by mouth 3 (three) times daily., Disp: 90 capsule, Rfl: 0 .  JARDIANCE 25 MG TABS tablet, , Disp: , Rfl:  .  lovastatin (MEVACOR) 20 MG tablet, Take 1 tablet (20 mg total) by mouth at bedtime., Disp: 30 tablet, Rfl: 0 .  metFORMIN (GLUCOPHAGE) 500 MG tablet, Take 1,000 mg by mouth 2 (two) times daily., Disp: , Rfl:  .  sulfamethoxazole-trimethoprim (BACTRIM DS) 800-160 MG tablet, Take 1 tablet by mouth 2 (two) times daily., Disp: , Rfl:  .  TRESIBA FLEXTOUCH 200 UNIT/ML SOPN, Inject 55 Units into the skin daily., Disp: , Rfl:  .  triamcinolone cream (KENALOG) 0.1 %, APPLY CREAM EXTERNALLY ONCE DAILY AS DIRECTED, Disp: , Rfl:   Allergies  Allergen Reactions  . Penicillins Itching and Rash    Has patient had a PCN reaction causing immediate rash, facial/tongue/throat swelling, SOB or lightheadedness with hypotension: Yes Has patient had a PCN reaction causing severe rash involving mucus membranes or skin necrosis: No Has patient had a PCN reaction that required hospitalization No Has patient had a PCN reaction occurring within the last 10 years:  No If all of the above answers are "NO", then may proceed with Cephalosporin use.     Objective: Vitals:   07/16/18 1450  Temp: (!) 97.2 F (36.2 C)    Vascular Examination: Capillary refill time <3 seconds x 10 digits.  Dorsalis pedis pulses present b/l.  Posterior tibial pulses present b/l.  No digital hair x 10 digits.  Skin temperature warm to cool b/l.  Dermatological Examination: Skin with normal turgor, texture and tone b/l.  Toenails 1-5 left, 2-5 right discolored, thick, dystrophic with subungual debris and pain with palpation to nailbeds due to thickness of nails.  Resolved hyperkeratotic lesion submet head 4 right foot.  Anonychia right great toe. Nailbed with nailbed hyperkeratosis. No erythema, no edema, no drainage, no flocculence. No signs of infection noted.  Musculoskeletal: Muscle strength 5/5 to all LE muscle groups.  Neurological: Sensation diminished with 10 gram monofilament.  Vibratory sensation diminished b/l.  Assessment: 1. Painful onychomycosis toenails 1-5 left, 2-5 right 2. Nailbed hyperkeratosis right hallux 3. NIDDM with neuropathy 4.  Plan: 1. Toenails 1-5 left, 2-5 right were debrided in length and girth without iatrogenic bleeding. 2. Nailbed calluses gently filed with burr. Triple antibiotic ointment and band aid applied to nailbed. [name] was instructed to apply triple antibiotic ointment to nailbed once daily. 3. Patient to continue soft, supportive shoe gear daily. 4. Patient to report any pedal injuries to medical professional immediately. 5. Follow up 3  months.  6. Patient/POA to call should there be a concern in the interim.

## 2018-07-31 DIAGNOSIS — E119 Type 2 diabetes mellitus without complications: Secondary | ICD-10-CM | POA: Diagnosis not present

## 2018-07-31 DIAGNOSIS — E7800 Pure hypercholesterolemia, unspecified: Secondary | ICD-10-CM | POA: Diagnosis not present

## 2018-08-11 DIAGNOSIS — I83009 Varicose veins of unspecified lower extremity with ulcer of unspecified site: Secondary | ICD-10-CM | POA: Diagnosis not present

## 2018-08-11 DIAGNOSIS — E1165 Type 2 diabetes mellitus with hyperglycemia: Secondary | ICD-10-CM | POA: Diagnosis not present

## 2018-08-11 DIAGNOSIS — E1142 Type 2 diabetes mellitus with diabetic polyneuropathy: Secondary | ICD-10-CM | POA: Diagnosis not present

## 2018-08-11 DIAGNOSIS — Z6841 Body Mass Index (BMI) 40.0 and over, adult: Secondary | ICD-10-CM | POA: Diagnosis not present

## 2018-08-11 DIAGNOSIS — L97909 Non-pressure chronic ulcer of unspecified part of unspecified lower leg with unspecified severity: Secondary | ICD-10-CM | POA: Diagnosis not present

## 2018-08-11 DIAGNOSIS — R69 Illness, unspecified: Secondary | ICD-10-CM | POA: Diagnosis not present

## 2018-08-11 DIAGNOSIS — Z299 Encounter for prophylactic measures, unspecified: Secondary | ICD-10-CM | POA: Diagnosis not present

## 2018-08-12 DIAGNOSIS — E1159 Type 2 diabetes mellitus with other circulatory complications: Secondary | ICD-10-CM | POA: Diagnosis not present

## 2018-08-12 DIAGNOSIS — E1140 Type 2 diabetes mellitus with diabetic neuropathy, unspecified: Secondary | ICD-10-CM | POA: Diagnosis not present

## 2018-08-28 DIAGNOSIS — E7800 Pure hypercholesterolemia, unspecified: Secondary | ICD-10-CM | POA: Diagnosis not present

## 2018-08-28 DIAGNOSIS — E119 Type 2 diabetes mellitus without complications: Secondary | ICD-10-CM | POA: Diagnosis not present

## 2018-09-01 DIAGNOSIS — E1165 Type 2 diabetes mellitus with hyperglycemia: Secondary | ICD-10-CM | POA: Diagnosis not present

## 2018-09-01 DIAGNOSIS — Z299 Encounter for prophylactic measures, unspecified: Secondary | ICD-10-CM | POA: Diagnosis not present

## 2018-09-01 DIAGNOSIS — E1129 Type 2 diabetes mellitus with other diabetic kidney complication: Secondary | ICD-10-CM | POA: Diagnosis not present

## 2018-09-01 DIAGNOSIS — Z6841 Body Mass Index (BMI) 40.0 and over, adult: Secondary | ICD-10-CM | POA: Diagnosis not present

## 2018-09-01 DIAGNOSIS — E1142 Type 2 diabetes mellitus with diabetic polyneuropathy: Secondary | ICD-10-CM | POA: Diagnosis not present

## 2018-09-01 DIAGNOSIS — R69 Illness, unspecified: Secondary | ICD-10-CM | POA: Diagnosis not present

## 2018-09-12 DIAGNOSIS — I83009 Varicose veins of unspecified lower extremity with ulcer of unspecified site: Secondary | ICD-10-CM | POA: Diagnosis not present

## 2018-09-12 DIAGNOSIS — E1165 Type 2 diabetes mellitus with hyperglycemia: Secondary | ICD-10-CM | POA: Diagnosis not present

## 2018-09-12 DIAGNOSIS — Z6841 Body Mass Index (BMI) 40.0 and over, adult: Secondary | ICD-10-CM | POA: Diagnosis not present

## 2018-09-12 DIAGNOSIS — L97909 Non-pressure chronic ulcer of unspecified part of unspecified lower leg with unspecified severity: Secondary | ICD-10-CM | POA: Diagnosis not present

## 2018-09-12 DIAGNOSIS — E1142 Type 2 diabetes mellitus with diabetic polyneuropathy: Secondary | ICD-10-CM | POA: Diagnosis not present

## 2018-09-12 DIAGNOSIS — Z299 Encounter for prophylactic measures, unspecified: Secondary | ICD-10-CM | POA: Diagnosis not present

## 2018-09-25 DIAGNOSIS — E119 Type 2 diabetes mellitus without complications: Secondary | ICD-10-CM | POA: Diagnosis not present

## 2018-09-25 DIAGNOSIS — E7800 Pure hypercholesterolemia, unspecified: Secondary | ICD-10-CM | POA: Diagnosis not present

## 2018-09-26 DIAGNOSIS — L97909 Non-pressure chronic ulcer of unspecified part of unspecified lower leg with unspecified severity: Secondary | ICD-10-CM | POA: Diagnosis not present

## 2018-09-26 DIAGNOSIS — I83009 Varicose veins of unspecified lower extremity with ulcer of unspecified site: Secondary | ICD-10-CM | POA: Diagnosis not present

## 2018-09-26 DIAGNOSIS — Z6841 Body Mass Index (BMI) 40.0 and over, adult: Secondary | ICD-10-CM | POA: Diagnosis not present

## 2018-09-26 DIAGNOSIS — E1165 Type 2 diabetes mellitus with hyperglycemia: Secondary | ICD-10-CM | POA: Diagnosis not present

## 2018-09-26 DIAGNOSIS — Z299 Encounter for prophylactic measures, unspecified: Secondary | ICD-10-CM | POA: Diagnosis not present

## 2018-09-26 DIAGNOSIS — E1142 Type 2 diabetes mellitus with diabetic polyneuropathy: Secondary | ICD-10-CM | POA: Diagnosis not present

## 2018-10-02 DIAGNOSIS — E119 Type 2 diabetes mellitus without complications: Secondary | ICD-10-CM | POA: Diagnosis not present

## 2018-10-02 DIAGNOSIS — Z299 Encounter for prophylactic measures, unspecified: Secondary | ICD-10-CM | POA: Diagnosis not present

## 2018-10-02 DIAGNOSIS — Z6841 Body Mass Index (BMI) 40.0 and over, adult: Secondary | ICD-10-CM | POA: Diagnosis not present

## 2018-10-02 DIAGNOSIS — R69 Illness, unspecified: Secondary | ICD-10-CM | POA: Diagnosis not present

## 2018-10-02 DIAGNOSIS — Z6831 Body mass index (BMI) 31.0-31.9, adult: Secondary | ICD-10-CM | POA: Diagnosis not present

## 2018-10-13 DIAGNOSIS — E1129 Type 2 diabetes mellitus with other diabetic kidney complication: Secondary | ICD-10-CM | POA: Diagnosis not present

## 2018-10-14 DIAGNOSIS — E119 Type 2 diabetes mellitus without complications: Secondary | ICD-10-CM | POA: Diagnosis not present

## 2018-10-15 ENCOUNTER — Other Ambulatory Visit: Payer: Self-pay

## 2018-10-15 ENCOUNTER — Telehealth: Payer: Self-pay | Admitting: *Deleted

## 2018-10-15 ENCOUNTER — Other Ambulatory Visit: Payer: Self-pay | Admitting: Podiatry

## 2018-10-15 ENCOUNTER — Ambulatory Visit (INDEPENDENT_AMBULATORY_CARE_PROVIDER_SITE_OTHER): Payer: Medicare HMO | Admitting: Podiatry

## 2018-10-15 ENCOUNTER — Encounter: Payer: Self-pay | Admitting: Podiatry

## 2018-10-15 ENCOUNTER — Ambulatory Visit (INDEPENDENT_AMBULATORY_CARE_PROVIDER_SITE_OTHER): Payer: Medicare HMO

## 2018-10-15 DIAGNOSIS — R609 Edema, unspecified: Secondary | ICD-10-CM

## 2018-10-15 DIAGNOSIS — L97521 Non-pressure chronic ulcer of other part of left foot limited to breakdown of skin: Secondary | ICD-10-CM

## 2018-10-15 DIAGNOSIS — M79661 Pain in right lower leg: Secondary | ICD-10-CM

## 2018-10-15 DIAGNOSIS — Z7982 Long term (current) use of aspirin: Secondary | ICD-10-CM | POA: Diagnosis not present

## 2018-10-15 DIAGNOSIS — L97512 Non-pressure chronic ulcer of other part of right foot with fat layer exposed: Secondary | ICD-10-CM

## 2018-10-15 DIAGNOSIS — B351 Tinea unguium: Secondary | ICD-10-CM

## 2018-10-15 DIAGNOSIS — Z794 Long term (current) use of insulin: Secondary | ICD-10-CM | POA: Diagnosis not present

## 2018-10-15 DIAGNOSIS — E11621 Type 2 diabetes mellitus with foot ulcer: Secondary | ICD-10-CM

## 2018-10-15 DIAGNOSIS — M79674 Pain in right toe(s): Secondary | ICD-10-CM

## 2018-10-15 DIAGNOSIS — Z79899 Other long term (current) drug therapy: Secondary | ICD-10-CM | POA: Diagnosis not present

## 2018-10-15 DIAGNOSIS — L539 Erythematous condition, unspecified: Secondary | ICD-10-CM

## 2018-10-15 DIAGNOSIS — R600 Localized edema: Secondary | ICD-10-CM | POA: Diagnosis not present

## 2018-10-15 DIAGNOSIS — E1142 Type 2 diabetes mellitus with diabetic polyneuropathy: Secondary | ICD-10-CM

## 2018-10-15 DIAGNOSIS — K219 Gastro-esophageal reflux disease without esophagitis: Secondary | ICD-10-CM | POA: Diagnosis not present

## 2018-10-15 DIAGNOSIS — L97511 Non-pressure chronic ulcer of other part of right foot limited to breakdown of skin: Secondary | ICD-10-CM

## 2018-10-15 DIAGNOSIS — R69 Illness, unspecified: Secondary | ICD-10-CM | POA: Diagnosis not present

## 2018-10-15 DIAGNOSIS — I440 Atrioventricular block, first degree: Secondary | ICD-10-CM | POA: Diagnosis not present

## 2018-10-15 DIAGNOSIS — R918 Other nonspecific abnormal finding of lung field: Secondary | ICD-10-CM | POA: Diagnosis not present

## 2018-10-15 DIAGNOSIS — E7800 Pure hypercholesterolemia, unspecified: Secondary | ICD-10-CM | POA: Diagnosis not present

## 2018-10-15 MED ORDER — DOXYCYCLINE HYCLATE 100 MG PO CAPS: 100.0000 mg | ORAL_CAPSULE | Freq: Two times a day (BID) | ORAL | 0 refills | Status: AC

## 2018-10-15 NOTE — Patient Instructions (Addendum)
DRESSING CHANGES RIGHT FOOT:  WEAR SURGICAL SHOE AT ALL TIMES    1. KEEP RIGHT  FOOT DRY AT ALL TIMES!!!!  2. CLEANSE ULCER WITH SALINE.  3. DAB DRY WITH GAUZE SPONGE.  4. APPLY A LIGHT AMOUNT OF IODOSORB TO BASE OF ULCER.  5. APPLY OUTER DRESSING/BAND-AID AS INSTRUCTED.  6. WEAR SURGICAL SHOE DAILY AT ALL TIMES.  7. DO NOT WALK BAREFOOT!!!  8.  IF YOU EXPERIENCE ANY FEVER, CHILLS, NIGHTSWEATS, NAUSEA OR VOMITING, ELEVATED OR LOW BLOOD SUGARS, REPORT TO EMERGENCY ROOM.  9. IF YOU EXPERIENCE INCREASED REDNESS, PAIN, SWELLING, DISCOLORATION, ODOR, PUS, DRAINAGE OR WARMTH OF YOUR FOOT, REPORT TO EMERGENCY ROOM.  

## 2018-10-15 NOTE — Telephone Encounter (Signed)
-----   Message from Wardell Heath, [place] sent at 10/15/2018  3:10 PM EDT ----- Regarding: rule out dvt Patient wants outpatient venous ultrasound , right lower leg, redness swelling and pain Thanks!

## 2018-10-15 NOTE — Progress Notes (Signed)
Subjective: Patient presents today with diabetes and cc of painful, discolored, thick toenails which interfere with daily activities. Pain is aggravated when wearing enclosed shoe gear. Pain is getting progressively worse and relieved with periodic professional debridement.  Patient presents with new problem of red, swollen rigth 3rd digit. Pt states he noticed his toe was red when taking a shower a few days ago. He has not noticed any drainage on his sock and denies any fever, chill, night sweats, nausea or vomiting.  He is followed by outpatient Wound Care for chronic lower extremity venous stasis ulcerations.    Mitchell Blitz, MD is his PCP.   Current Outpatient Medications on File Prior to Visit  Medication Sig Dispense Refill  . aspirin EC 81 MG tablet Take 81 mg by mouth daily.    . canagliflozin (INVOKANA) 300 MG TABS tablet Take 300 mg by mouth daily before breakfast.    . furosemide (LASIX) 20 MG tablet Take 1 tablet (20 mg total) by mouth daily. 30 tablet 0  . gabapentin (NEURONTIN) 300 MG capsule Take 1 capsule (300 mg total) by mouth 3 (three) times daily. 90 capsule 0  . JARDIANCE 25 MG TABS tablet     . lovastatin (MEVACOR) 20 MG tablet Take 1 tablet (20 mg total) by mouth at bedtime. 30 tablet 0  . metFORMIN (GLUCOPHAGE) 500 MG tablet Take 1,000 mg by mouth 2 (two) times daily.    Marland Kitchen sulfamethoxazole-trimethoprim (BACTRIM DS) 800-160 MG tablet Take 1 tablet by mouth 2 (two) times daily.    . TRESIBA FLEXTOUCH 200 UNIT/ML SOPN Inject 55 Units into the skin daily.    Marland Kitchen triamcinolone cream (KENALOG) 0.1 % APPLY CREAM EXTERNALLY ONCE DAILY AS DIRECTED     No current facility-administered medications on file prior to visit.      Allergies  Allergen Reactions  . Penicillins Itching and Rash    Has patient had a PCN reaction causing immediate rash, facial/tongue/throat swelling, SOB or lightheadedness with hypotension: Yes Has patient had a PCN reaction causing severe rash  involving mucus membranes or skin necrosis: No Has patient had a PCN reaction that required hospitalization No Has patient had a PCN reaction occurring within the last 10 years: No If all of the above answers are "NO", then may proceed with Cephalosporin use.     Objective:  Vascular Examination: Capillary refill time <3 seconds x 10 digits.  Dorsalis pedis pulses present b/l.  Posterior tibial pulses present b/l.  Digital hair absent b/l x 10 digits.  Skin temperature gradient warm to cool LLE; RLE increased warmth from tibia to foot.  +Tenderness with calf compression.  Edema b/l LE right >left  Rubor noted RLE.  Dermatological Examination: Skin thin, shiny and atrophic b/l.  Toenails Right hallux and digits 2-5 b/l discolored, thick, dystrophic with subungual debris and pain with palpation to nailbeds due to thickness of nails. Anonychia left hallux toe(s)  with evidence of permanent total nail avulsion. Nailbed(s) completely epithelialized and intact.   Ulceration located distal tip right 3rd digit.  Predebridement measurements carried out today of 1.5 x  1.5 cm.  +Erythema.    Postdebridement measurements today are: 1.0 x 1.0 x 0.1 cm to level of subcutaneous tissue.  +Erythema.   No probing to bone, no undermining, no active pus or purulence noted.  No deep abscess, no erythema, no edema. +Malodor  Hyperkeratotic lesion submet head 5 left foot with tenderness to palpation. No edema, no erythema, no drainage, no flocculence.  Musculoskeletal: Muscle strength 5/5 to all LE muscle groups.  Hammertoes lesser digits b/l.  Neurological: Sensation diminished b/l with 10 gram monofilament.  Vibratory sensation diminished b/l.  Xray right foot:  No evidence of gas in tissues No evidence of bone erosion distal phalanx right 3rd digit Evidence of 5th metatarsal head resection Hammertoe deformity lesser digits  Assessment: 1. Painful onychomycosis toenails 1-5 b/l 2.  Diabetic Ulceration right 3rd digit 3. Rule out DVT right LE 4. NIDDM with peripheral neuropathy  Plan: 1. Toenails right hallux and digits 2-5 b/l were debrided in length and girth without iatrogenic bleeding. 2. Ulcer was debrided to the level of bleeding or viable tissue. Ulcer was cleansed with wound cleanser. Iodosorb Gel was applied to base of wound with light dressing. 3. Xray right foot was taken and reviewed with Mitchell [name].   4. Surgical shoe was dispensed for left foot. 5. Patient was given written instructions on offloading and dressing change/aftercare and was instructed to call immediately if any signs or symptoms of infection arise.  6. Advised patient of concern for DVT. Advised him to visit ED for evaluation, but patient refused. Discussed DVT and PE, but patient insists on getting venous dopplers outpatient. Order will be sent for venous dopplers to rule out DVT RLE. 7. Rx sent to his pharmacy for Doxycycline 100 mg po bid for 10 days. 8. Patient instructed to report to emergency department with worsening appearance of ulcer/toe/foot, increased pain, foul odor, increased redness, swelling, drainage, fever, chills, nightsweats, nausea, vomiting, increased blood sugar.  9. Patient/POA related understanding. 10. Follow up in our office in  one week for ulcer right 3rd digit with [name]. 11. Patient/POA to call should there be a concern in the interim.

## 2018-10-15 NOTE — Telephone Encounter (Signed)
Faxed orders on required form to [facility].

## 2018-10-16 ENCOUNTER — Telehealth: Payer: Self-pay | Admitting: *Deleted

## 2018-10-16 ENCOUNTER — Telehealth (HOSPITAL_COMMUNITY): Payer: Self-pay | Admitting: *Deleted

## 2018-10-16 DIAGNOSIS — R609 Edema, unspecified: Secondary | ICD-10-CM | POA: Diagnosis not present

## 2018-10-16 DIAGNOSIS — R600 Localized edema: Secondary | ICD-10-CM | POA: Diagnosis not present

## 2018-10-16 NOTE — Telephone Encounter (Signed)
10/16/18 no answer no vm available.

## 2018-10-16 NOTE — Telephone Encounter (Signed)
[facility] - Mitchell [name] states she contacted Mitchell [name] and he states he is going to [facility] for the venous doppler.

## 2018-10-16 NOTE — Telephone Encounter (Addendum)
10/16/18  8:00 attempted to reach pt, no answer/no voicemail set up.  8:41 am- spoke w/pt who stated he was having the exam done at [facility] this morning.  Left VM with Dr. Heber S[place] nurse to make her aware order is being canceled as exam is being done at [facility].

## 2018-10-24 ENCOUNTER — Ambulatory Visit (INDEPENDENT_AMBULATORY_CARE_PROVIDER_SITE_OTHER): Payer: Medicare HMO | Admitting: Podiatry

## 2018-10-24 ENCOUNTER — Ambulatory Visit (INDEPENDENT_AMBULATORY_CARE_PROVIDER_SITE_OTHER): Payer: Medicare HMO

## 2018-10-24 ENCOUNTER — Other Ambulatory Visit: Payer: Self-pay

## 2018-10-24 VITALS — Temp 98.1°F

## 2018-10-24 DIAGNOSIS — L859 Epidermal thickening, unspecified: Secondary | ICD-10-CM | POA: Diagnosis not present

## 2018-10-24 DIAGNOSIS — M205X1 Other deformities of toe(s) (acquired), right foot: Secondary | ICD-10-CM

## 2018-10-24 DIAGNOSIS — M2041 Other hammer toe(s) (acquired), right foot: Secondary | ICD-10-CM | POA: Diagnosis not present

## 2018-10-24 DIAGNOSIS — E11621 Type 2 diabetes mellitus with foot ulcer: Secondary | ICD-10-CM | POA: Diagnosis not present

## 2018-10-24 DIAGNOSIS — L97512 Non-pressure chronic ulcer of other part of right foot with fat layer exposed: Secondary | ICD-10-CM

## 2018-10-27 ENCOUNTER — Encounter: Payer: Self-pay | Admitting: Podiatry

## 2018-10-27 DIAGNOSIS — E1142 Type 2 diabetes mellitus with diabetic polyneuropathy: Secondary | ICD-10-CM | POA: Diagnosis not present

## 2018-10-27 DIAGNOSIS — I83009 Varicose veins of unspecified lower extremity with ulcer of unspecified site: Secondary | ICD-10-CM | POA: Diagnosis not present

## 2018-10-27 DIAGNOSIS — E1165 Type 2 diabetes mellitus with hyperglycemia: Secondary | ICD-10-CM | POA: Diagnosis not present

## 2018-10-27 DIAGNOSIS — M869 Osteomyelitis, unspecified: Secondary | ICD-10-CM | POA: Diagnosis not present

## 2018-10-27 DIAGNOSIS — Z6841 Body Mass Index (BMI) 40.0 and over, adult: Secondary | ICD-10-CM | POA: Diagnosis not present

## 2018-10-27 DIAGNOSIS — R69 Illness, unspecified: Secondary | ICD-10-CM | POA: Diagnosis not present

## 2018-10-27 DIAGNOSIS — Z299 Encounter for prophylactic measures, unspecified: Secondary | ICD-10-CM | POA: Diagnosis not present

## 2018-10-27 NOTE — Progress Notes (Signed)
  Subjective:  Patient ID: Mitchell [name], male    DOB: 27-Mar-1950,  MRN: [id number]  Chief Complaint  Patient presents with  . Diabetic Ulcer    Right 3rd ulcer check. Pt states no concerns, denies fever/chills/nausea/vomiting. Pt states he has had some drainage but no pus.    68 y.o. male returns today for his right third digit ulceration.  Patient has a callus formation with underlying ulceration of the right third digit.  He is an uncontrolled diabetic with glucose generally in the 200s.  He states this is also has gotten worse over the past couple of weeks and is progressively deeper.  He of note he also has a third digit contracture of the deformity leading to increased pressure at the distal aspect of the third toe of the right foot.  Objective:   Vitals:   10/24/18 1449  Temp: 98.1 F (36.7 C)    General AA&O x3. Normal mood and affect.  Vascular Pedal pulses palpable.  Neurologic Epicritic sensation grossly intact.  Dermatologic Pre-ulcerative callus at the tip of the right, 3rd toe  Orthopedic: Semi-reducible hammertoe deformity right, 3rd toe    Assessment & Plan:  Patient was evaluated and treated and all questions answered.  Hammertoe of the right third digit with pre-ulcerative callus -Patient was explained the etiology of the hammertoe and with setting of neuropathy leading to ulcer formation and increased pressure to the distal tip of the third digit. -Flexor tenotomy as below. -Advised to remove the dressing in 24 hours and apply a band-aid and triple abx ointment every day thereafter.  -Ulceration formation of the right third digit -2-1/2 cm x 2 and half centimeter post debridement ulceration noted with granular wound base.  No erythema malodor or any other clinical signs of infection present. -Using a chisel blade, the hyperkeratotic lesion or callus was debrided down to healthy granular tissue.  No complications noted.   Procedure: Flexor Tenotomy Indication for  Procedure: toe with semi-reducible hammertoe with distal tip ulceration. Flexor tenotomy indicated to alleviate contracture, reduce pressure, and enhance healing of the ulceration. Location: right, 3rd toe Anesthesia: Lidocaine 1% plain; 1.5 mL and Marcaine 0.5% plain; 1.5 mL digital block Instrumentation: #11 blade  Technique: The toe was anesthetized as above and prepped in the usual fashion. The toe was exsanquinated and a tourniquet was secured at the base of the toe. An 18g needle was then used to percutaneously release the flexor tendon at the plantar surface of the toe with noted release of the hammertoe deformity. The incision was then dressed with antibiotic ointment and band-aid. Compression splint dressing applied. Patient tolerated the procedure well. Dressing: Dry, sterile, compression dressing. Disposition: Patient tolerated procedure well. Patient to return in 1 week for follow-up.      No follow-ups on file.

## 2018-10-30 ENCOUNTER — Other Ambulatory Visit: Payer: Self-pay

## 2018-10-30 ENCOUNTER — Ambulatory Visit (INDEPENDENT_AMBULATORY_CARE_PROVIDER_SITE_OTHER): Payer: Medicare HMO | Admitting: Podiatry

## 2018-10-30 DIAGNOSIS — E11621 Type 2 diabetes mellitus with foot ulcer: Secondary | ICD-10-CM | POA: Diagnosis not present

## 2018-10-30 DIAGNOSIS — L97512 Non-pressure chronic ulcer of other part of right foot with fat layer exposed: Secondary | ICD-10-CM | POA: Diagnosis not present

## 2018-10-30 DIAGNOSIS — M205X1 Other deformities of toe(s) (acquired), right foot: Secondary | ICD-10-CM

## 2018-10-31 ENCOUNTER — Encounter: Payer: Self-pay | Admitting: Podiatry

## 2018-10-31 NOTE — Progress Notes (Signed)
Subjective:  Patient ID: Mitchell [name], male    DOB: August 06, 1950,  MRN: [id number]  Chief Complaint  Patient presents with  . Foot Pain    pt is here for a ulcer f/u of the right foot second toe, pt states that it is looking a lot better since he was last here    68 y.o. male presents for wound care.  Patient states he is doing well this is a 1 week follow-up from his right third digit ulceration.  I performed a flexor tenotomy during his last visit.  Good reduction of the hammertoe was noted.  There is good offloading of the digit.  The ulcer is improving no clinical signs of infection.  He has been taking his antibiotics.  No other complaints.   Review of Systems: Negative except as noted in the HPI. Denies N/V/F/Ch.  Past Medical History:  Diagnosis Date  . Arthritis   . Cellulitis of right lower extremity 05/09/2014  . Diabetes mellitus without complication ([place])   . Diabetic foot ulcer ([place]) 07/07/2014   Status post bedside debridement of multiple right plantar ulcers by podiatrist, Dr. Laurena Spies.  . Hypercholesterolemia   . Maggot infestation    right foot ulcer  . Obesity 05/09/2014  . Tobacco abuse 07/07/2014    Current Outpatient Medications:  .  aspirin EC 81 MG tablet, Take 81 mg by mouth daily., Disp: , Rfl:  .  canagliflozin (INVOKANA) 300 MG TABS tablet, Take 300 mg by mouth daily before breakfast., Disp: , Rfl:  .  furosemide (LASIX) 20 MG tablet, Take 1 tablet (20 mg total) by mouth daily., Disp: 30 tablet, Rfl: 0 .  gabapentin (NEURONTIN) 300 MG capsule, Take 1 capsule (300 mg total) by mouth 3 (three) times daily., Disp: 90 capsule, Rfl: 0 .  JARDIANCE 25 MG TABS tablet, , Disp: , Rfl:  .  lovastatin (MEVACOR) 20 MG tablet, Take 1 tablet (20 mg total) by mouth at bedtime., Disp: 30 tablet, Rfl: 0 .  metFORMIN (GLUCOPHAGE) 500 MG tablet, Take 1,000 mg by mouth 2 (two) times daily., Disp: , Rfl:  .  sulfamethoxazole-trimethoprim (BACTRIM DS) 800-160 MG tablet, Take 1 tablet by  mouth 2 (two) times daily., Disp: , Rfl:  .  TRESIBA FLEXTOUCH 200 UNIT/ML SOPN, Inject 55 Units into the skin daily., Disp: , Rfl:  .  triamcinolone cream (KENALOG) 0.1 %, APPLY CREAM EXTERNALLY ONCE DAILY AS DIRECTED, Disp: , Rfl:   Social History   Tobacco Use  Smoking Status Current Every Day Smoker  . Packs/day: 1.50  . Types: Cigarettes  Smokeless Tobacco Never Used    Allergies  Allergen Reactions  . Penicillins Itching and Rash    Has patient had a PCN reaction causing immediate rash, facial/tongue/throat swelling, SOB or lightheadedness with hypotension: Yes Has patient had a PCN reaction causing severe rash involving mucus membranes or skin necrosis: No Has patient had a PCN reaction that required hospitalization No Has patient had a PCN reaction occurring within the last 10 years: No If all of the above answers are "NO", then may proceed with Cephalosporin use.    Objective:  There were no vitals filed for this visit. There is no height or weight on file to calculate BMI. Constitutional Well developed. Well nourished.  Vascular Dorsalis pedis pulses palpable bilaterally. Posterior tibial pulses palpable bilaterally. Capillary refill normal to all digits.  No cyanosis or clubbing noted. Pedal hair growth normal.  Neurologic Normal speech. Oriented to person, place, and time. Protective  sensation absent  Dermatologic Wound Location: Right third digit distal tip wound Wound Base: Granular/Healthy Peri-wound: Hyperkeratotic Exudate: Scant/small amount Serous exudate   Orthopedic: No pain to palpation either foot.   Radiographs: None Assessment:   1. Diabetic ulcer of toe of right foot associated with type 2 diabetes mellitus, with fat layer exposed (HCC)   2. Contracture of toe of right foot    Plan:  Patient was evaluated and treated and all questions answered.  Right third digit distal tip ulceration -Debridement as below. -Dressed with Betadine  wet-to-dry dressings.  Educated him to switch over to triple antibiotic and a Band-Aid after 3 days. -  Right third digit hammertoe contracture status post flexor tenotomy -Patient states the suture came out but everything is healing well at the tenotomy site.  Good reduction of the hammertoe noted. -Good reduction and alignment and offloading of the ulceration noted.  Procedure: Excisional Debridement of Wound Rationale: Removal of non-viable soft tissue from the wound to promote healing.  Anesthesia: none Pre-Debridement Wound Measurements: 2.5 cm x 2.5 cm x 0.2 cm  Post-Debridement Wound Measurements: 2 cm x 2 cm x 0.2 cm  Type of Debridement: Sharp Excisional Tissue Removed: Non-viable soft tissue Depth of Debridement: subcutaneous tissue. Technique: Sharp excisional debridement to bleeding, viable wound base.  Dressing: Dry, sterile, compression dressing. Disposition: Patient tolerated procedure well. Patient to return in 1 week for follow-up.  No follow-ups on file.

## 2018-11-07 ENCOUNTER — Ambulatory Visit: Payer: Medicare HMO | Admitting: Podiatry

## 2018-11-10 ENCOUNTER — Ambulatory Visit (INDEPENDENT_AMBULATORY_CARE_PROVIDER_SITE_OTHER): Payer: Medicare HMO | Admitting: Podiatry

## 2018-11-10 ENCOUNTER — Other Ambulatory Visit: Payer: Self-pay

## 2018-11-10 ENCOUNTER — Encounter: Payer: Self-pay | Admitting: Podiatry

## 2018-11-10 DIAGNOSIS — E1142 Type 2 diabetes mellitus with diabetic polyneuropathy: Secondary | ICD-10-CM

## 2018-11-10 DIAGNOSIS — E7800 Pure hypercholesterolemia, unspecified: Secondary | ICD-10-CM | POA: Diagnosis not present

## 2018-11-10 DIAGNOSIS — L97512 Non-pressure chronic ulcer of other part of right foot with fat layer exposed: Secondary | ICD-10-CM

## 2018-11-10 DIAGNOSIS — E119 Type 2 diabetes mellitus without complications: Secondary | ICD-10-CM | POA: Diagnosis not present

## 2018-11-10 DIAGNOSIS — E11621 Type 2 diabetes mellitus with foot ulcer: Secondary | ICD-10-CM

## 2018-11-10 DIAGNOSIS — M2041 Other hammer toe(s) (acquired), right foot: Secondary | ICD-10-CM

## 2018-11-10 NOTE — Progress Notes (Signed)
Subjective:  Patient ID: Mitchell [name], male    DOB: 1950-07-25,  MRN: [id number]  Chief Complaint  Patient presents with  . Diabetic Ulcer    Rt 2nd digit: " my toe is doing much better, I am not having anymore pain with it"     68 y.o. male presents for wound care.  Patient states that he is doing well.  He has been applying triple antibiotic and Band-Aid daily to the wound site.  He states that he is doing pretty well he has not been getting them wet.  The wound is decreasing in size.  Review of Systems: Negative except as noted in the HPI. Denies N/V/F/Ch.  Past Medical History:  Diagnosis Date  . Arthritis   . Cellulitis of right lower extremity 05/09/2014  . Diabetes mellitus without complication ([place])   . Diabetic foot ulcer ([place]) 07/07/2014   Status post bedside debridement of multiple right plantar ulcers by podiatrist, Dr. Laurena Spies.  . Hypercholesterolemia   . Maggot infestation    right foot ulcer  . Obesity 05/09/2014  . Tobacco abuse 07/07/2014    Current Outpatient Medications:  .  aspirin EC 81 MG tablet, Take 81 mg by mouth daily., Disp: , Rfl:  .  canagliflozin (INVOKANA) 300 MG TABS tablet, Take 300 mg by mouth daily before breakfast., Disp: , Rfl:  .  furosemide (LASIX) 20 MG tablet, Take 1 tablet (20 mg total) by mouth daily., Disp: 30 tablet, Rfl: 0 .  gabapentin (NEURONTIN) 300 MG capsule, Take 1 capsule (300 mg total) by mouth 3 (three) times daily., Disp: 90 capsule, Rfl: 0 .  JARDIANCE 25 MG TABS tablet, , Disp: , Rfl:  .  lovastatin (MEVACOR) 20 MG tablet, Take 1 tablet (20 mg total) by mouth at bedtime., Disp: 30 tablet, Rfl: 0 .  metFORMIN (GLUCOPHAGE) 500 MG tablet, Take 1,000 mg by mouth 2 (two) times daily., Disp: , Rfl:  .  sulfamethoxazole-trimethoprim (BACTRIM DS) 800-160 MG tablet, Take 1 tablet by mouth 2 (two) times daily., Disp: , Rfl:  .  TRESIBA FLEXTOUCH 200 UNIT/ML SOPN, Inject 55 Units into the skin daily., Disp: , Rfl:  .  triamcinolone cream  (KENALOG) 0.1 %, APPLY CREAM EXTERNALLY ONCE DAILY AS DIRECTED, Disp: , Rfl:   Social History   Tobacco Use  Smoking Status Current Every Day Smoker  . Packs/day: 1.50  . Types: Cigarettes  Smokeless Tobacco Never Used    Allergies  Allergen Reactions  . Penicillins Itching and Rash    Has patient had a PCN reaction causing immediate rash, facial/tongue/throat swelling, SOB or lightheadedness with hypotension: Yes Has patient had a PCN reaction causing severe rash involving mucus membranes or skin necrosis: No Has patient had a PCN reaction that required hospitalization No Has patient had a PCN reaction occurring within the last 10 years: No If all of the above answers are "NO", then may proceed with Cephalosporin use.    Objective:  There were no vitals filed for this visit. There is no height or weight on file to calculate BMI. Constitutional Well developed. Well nourished.  Vascular Dorsalis pedis pulses palpable bilaterally. Posterior tibial pulses palpable bilaterally. Capillary refill normal to all digits.  No cyanosis or clubbing noted. Pedal hair growth normal.  Neurologic Normal speech. Oriented to person, place, and time. Protective sensation absent  Dermatologic Wound Location: Right second digit distal tip ulceration Wound Base: Granular/Healthy Peri-wound: Calloused Exudate: Scant/small amount Serous exudate Wound Measurements: -See below  Orthopedic: No pain  to palpation either foot.   Radiographs: None Assessment:   1. Diabetic ulcer of toe of right foot associated with type 2 diabetes mellitus, with fat layer exposed (HCC)   2. Hammertoe of right foot   3. Diabetic peripheral neuropathy associated with type 2 diabetes mellitus (HCC)    Plan:  Patient was evaluated and treated and all questions answered. Right second digit distal tip ulceration -Debridement as below. -Dressed with triple antibiotic and a Band-Aid, DSD. -Continue off-loading with  surgical shoe.    Procedure: Excisional Debridement of Wound Rationale: Removal of non-viable soft tissue from the wound to promote healing.  Anesthesia: none Pre-Debridement Wound Measurements: 1 cm  X 0.5 cm x 0.2 Post-Debridement Wound Measurements: 0.9 cm x 0.4 cm x 0.2 cm  Type of Debridement: Sharp Excisional Tissue Removed: Non-viable soft tissue Depth of Debridement: subcutaneous tissue. Technique: Sharp excisional debridement to bleeding, viable wound base.  Dressing: Dry, sterile, compression dressing. Disposition: Patient tolerated procedure well. Patient to return in 2 week for follow-up.  No follow-ups on file.

## 2018-11-12 DIAGNOSIS — E119 Type 2 diabetes mellitus without complications: Secondary | ICD-10-CM | POA: Diagnosis not present

## 2018-11-14 DIAGNOSIS — E1129 Type 2 diabetes mellitus with other diabetic kidney complication: Secondary | ICD-10-CM | POA: Diagnosis not present

## 2018-11-24 ENCOUNTER — Other Ambulatory Visit: Payer: Self-pay

## 2018-11-24 ENCOUNTER — Ambulatory Visit (INDEPENDENT_AMBULATORY_CARE_PROVIDER_SITE_OTHER): Payer: Medicare HMO | Admitting: Podiatry

## 2018-11-24 DIAGNOSIS — E11621 Type 2 diabetes mellitus with foot ulcer: Secondary | ICD-10-CM | POA: Diagnosis not present

## 2018-11-24 DIAGNOSIS — E1142 Type 2 diabetes mellitus with diabetic polyneuropathy: Secondary | ICD-10-CM | POA: Diagnosis not present

## 2018-11-24 DIAGNOSIS — L97512 Non-pressure chronic ulcer of other part of right foot with fat layer exposed: Secondary | ICD-10-CM | POA: Diagnosis not present

## 2018-11-25 NOTE — Progress Notes (Signed)
Subjective:  Patient ID: Mitchell [name], male    DOB: 1950-09-03,  MRN: [id number]  Chief Complaint  Patient presents with  . Foot Ulcer    pt is here for diabetic ulcer of the right second toe, pt states that he is doing great, and also shows no signs of infection    68 y.o. male presents with the above complaint.  Patient is here for follow-up of his diabetic ulcer of the right second toe.  Patient states that it is doing great it has considerably decreased in size.  There is no clinical signs of infection noted.   Review of Systems: Negative except as noted in the HPI. Denies N/V/F/Ch.  Past Medical History:  Diagnosis Date  . Arthritis   . Cellulitis of right lower extremity 05/09/2014  . Diabetes mellitus without complication ([place])   . Diabetic foot ulcer ([place]) 07/07/2014   Status post bedside debridement of multiple right plantar ulcers by podiatrist, Dr. Laurena Spies.  . Hypercholesterolemia   . Maggot infestation    right foot ulcer  . Obesity 05/09/2014  . Tobacco abuse 07/07/2014    Current Outpatient Medications:  .  aspirin EC 81 MG tablet, Take 81 mg by mouth daily., Disp: , Rfl:  .  canagliflozin (INVOKANA) 300 MG TABS tablet, Take 300 mg by mouth daily before breakfast., Disp: , Rfl:  .  furosemide (LASIX) 20 MG tablet, Take 1 tablet (20 mg total) by mouth daily., Disp: 30 tablet, Rfl: 0 .  gabapentin (NEURONTIN) 300 MG capsule, Take 1 capsule (300 mg total) by mouth 3 (three) times daily., Disp: 90 capsule, Rfl: 0 .  JARDIANCE 25 MG TABS tablet, , Disp: , Rfl:  .  lovastatin (MEVACOR) 20 MG tablet, Take 1 tablet (20 mg total) by mouth at bedtime., Disp: 30 tablet, Rfl: 0 .  metFORMIN (GLUCOPHAGE) 500 MG tablet, Take 1,000 mg by mouth 2 (two) times daily., Disp: , Rfl:  .  sulfamethoxazole-trimethoprim (BACTRIM DS) 800-160 MG tablet, Take 1 tablet by mouth 2 (two) times daily., Disp: , Rfl:  .  TRESIBA FLEXTOUCH 200 UNIT/ML SOPN, Inject 55 Units into the skin daily., Disp: , Rfl:   .  triamcinolone cream (KENALOG) 0.1 %, APPLY CREAM EXTERNALLY ONCE DAILY AS DIRECTED, Disp: , Rfl:   Social History   Tobacco Use  Smoking Status Current Every Day Smoker  . Packs/day: 1.50  . Types: Cigarettes  Smokeless Tobacco Never Used    Allergies  Allergen Reactions  . Penicillins Itching and Rash    Has patient had a PCN reaction causing immediate rash, facial/tongue/throat swelling, SOB or lightheadedness with hypotension: Yes Has patient had a PCN reaction causing severe rash involving mucus membranes or skin necrosis: No Has patient had a PCN reaction that required hospitalization No Has patient had a PCN reaction occurring within the last 10 years: No If all of the above answers are "NO", then may proceed with Cephalosporin use.    Objective:  There were no vitals filed for this visit. There is no height or weight on file to calculate BMI. Constitutional Well developed. Well nourished.  Vascular Dorsalis pedis pulses palpable bilaterally. Posterior tibial pulses palpable bilaterally. Capillary refill normal to all digits.  No cyanosis or clubbing noted. Pedal hair growth normal.  Neurologic Normal speech. Oriented to person, place, and time. Epicritic sensation to light touch grossly present bilaterally.  Dermatologic  0.2 cm x 0.2 cm second digit ulceration noted.  Granular wound base mild hyperkeratotic periwound tissue.  No  other clinical signs of infection noted.  No malodor or erythema noted.  Orthopedic: Normal joint ROM without pain or crepitus bilaterally. No visible deformities. No bony tenderness.   Radiographs: None Assessment:   1. Diabetic ulcer of toe of right foot associated with type 2 diabetes mellitus, with fat layer exposed (HCC)   2. Diabetic peripheral neuropathy associated with type 2 diabetes mellitus (HCC)    Plan:  Patient was evaluated and treated and all questions answered. Right second digit distal tip ulceration -Given that  the wound is stable with granular base, I believe the wound can be primarily closed given the size of it.  This may be more beneficial because allowing the wound to stay open can lead to high risk for chances of infection versus osteomyelitis. -It was determined the patient will benefit from primary closure of right second digit ulceration.  See procedure note below  Right second digit primary closure -Right second trigger Betadine skin prep was performed.  Hyperkeratotic lesion of the periwound was debrided using a chisel blade no complication noted.  The wound was primarily closed using a 3-0 Prolene in simple interrupted suture technique x3.  No complications noted. -I instructed the patient to keep the bandage on until he comes sees me in 2 weeks where I will remove the stitches and I am hopeful that the ulceration has closed.  No follow-ups on file.

## 2018-11-26 ENCOUNTER — Encounter: Payer: Self-pay | Admitting: Podiatry

## 2018-12-08 ENCOUNTER — Other Ambulatory Visit: Payer: Self-pay

## 2018-12-08 ENCOUNTER — Ambulatory Visit (INDEPENDENT_AMBULATORY_CARE_PROVIDER_SITE_OTHER): Payer: Medicare HMO

## 2018-12-08 ENCOUNTER — Ambulatory Visit (INDEPENDENT_AMBULATORY_CARE_PROVIDER_SITE_OTHER): Payer: Medicare HMO | Admitting: Podiatry

## 2018-12-08 DIAGNOSIS — E1142 Type 2 diabetes mellitus with diabetic polyneuropathy: Secondary | ICD-10-CM

## 2018-12-08 DIAGNOSIS — L97512 Non-pressure chronic ulcer of other part of right foot with fat layer exposed: Secondary | ICD-10-CM

## 2018-12-08 DIAGNOSIS — L98421 Non-pressure chronic ulcer of back limited to breakdown of skin: Secondary | ICD-10-CM | POA: Diagnosis not present

## 2018-12-08 DIAGNOSIS — M79672 Pain in left foot: Secondary | ICD-10-CM | POA: Diagnosis not present

## 2018-12-08 DIAGNOSIS — Z299 Encounter for prophylactic measures, unspecified: Secondary | ICD-10-CM | POA: Diagnosis not present

## 2018-12-08 DIAGNOSIS — Z6841 Body Mass Index (BMI) 40.0 and over, adult: Secondary | ICD-10-CM | POA: Diagnosis not present

## 2018-12-08 DIAGNOSIS — Z713 Dietary counseling and surveillance: Secondary | ICD-10-CM | POA: Diagnosis not present

## 2018-12-08 DIAGNOSIS — E11622 Type 2 diabetes mellitus with other skin ulcer: Secondary | ICD-10-CM | POA: Diagnosis not present

## 2018-12-08 DIAGNOSIS — M869 Osteomyelitis, unspecified: Secondary | ICD-10-CM | POA: Diagnosis not present

## 2018-12-08 DIAGNOSIS — M86171 Other acute osteomyelitis, right ankle and foot: Secondary | ICD-10-CM

## 2018-12-08 DIAGNOSIS — E11621 Type 2 diabetes mellitus with foot ulcer: Secondary | ICD-10-CM

## 2018-12-08 DIAGNOSIS — E1165 Type 2 diabetes mellitus with hyperglycemia: Secondary | ICD-10-CM | POA: Diagnosis not present

## 2018-12-08 MED ORDER — DOXYCYCLINE HYCLATE 100 MG PO TABS: 100.0000 mg | ORAL_TABLET | Freq: Two times a day (BID) | ORAL | 0 refills | Status: DC

## 2018-12-08 NOTE — Patient Instructions (Signed)
Pre-Operative Instructions  Congratulations, you have decided to take an important step towards improving your quality of life.  You can be assured that the doctors and staff at [facility] will be with you every step of the way.  Here are some important things you should know:  1. Plan to be at the surgery center/hospital at least 1 (one) hour prior to your scheduled time, unless otherwise directed by the surgical center/hospital staff.  You must have a responsible adult accompany you, remain during the surgery and drive you home.  Make sure you have directions to the surgical center/hospital to ensure you arrive on time. 2. If you are having surgery at [facility] or [facility], you will need a copy of your medical history and physical form from your family physician within one month prior to the date of surgery. We will give you a form for your primary physician to complete.  3. We make every effort to accommodate the date you request for surgery.  However, there are times where surgery dates or times have to be moved.  We will contact you as soon as possible if a change in schedule is required.   4. No aspirin/ibuprofen for one week before surgery.  If you are on aspirin, any non-steroidal anti-inflammatory medications (Mobic, Aleve, Ibuprofen) should not be taken seven (7) days prior to your surgery.  You make take Tylenol for pain prior to surgery.  5. Medications - If you are taking daily heart and blood pressure medications, seizure, reflux, allergy, asthma, anxiety, pain or diabetes medications, make sure you notify the surgery center/hospital before the day of surgery so they can tell you which medications you should take or avoid the day of surgery. 6. No food or drink after midnight the night before surgery unless directed otherwise by surgical center/hospital staff. 7. No alcoholic beverages 24-hours prior to surgery.  No smoking 24-hours prior or 24-hours after  surgery. 8. Wear loose pants or shorts. They should be loose enough to fit over bandages, boots, and casts. 9. Don't wear slip-on shoes. Sneakers are preferred. 10. Bring your boot with you to the surgery center/hospital.  Also bring crutches or a walker if your physician has prescribed it for you.  If you do not have this equipment, it will be provided for you after surgery. 11. If you have not been contacted by the surgery center/hospital by the day before your surgery, call to confirm the date and time of your surgery. 12. Leave-time from work may vary depending on the type of surgery you have.  Appropriate arrangements should be made prior to surgery with your employer. 13. Prescriptions will be provided immediately following surgery by your doctor.  Fill these as soon as possible after surgery and take the medication as directed. Pain medications will not be refilled on weekends and must be approved by the doctor. 14. Remove nail polish on the operative foot and avoid getting pedicures prior to surgery. 15. Wash the night before surgery.  The night before surgery wash the foot and leg well with water and the antibacterial soap provided. Be sure to pay special attention to beneath the toenails and in between the toes.  Wash for at least three (3) minutes. Rinse thoroughly with water and dry well with a towel.  Perform this wash unless told not to do so by your physician.  Enclosed: 1 Ice pack (please put in freezer the night before surgery)   1 Hibiclens skin cleaner     Pre-op instructions  If you have any questions regarding the instructions, please do not hesitate to call our office.  [place]: [address], [place], [place] [address] -- [phone]  [place]: [address] [place], [place] [address] -- [phone]  [place]: [address]  [place], [place] [address] -- [phone]   Website: [email or web address] 

## 2018-12-09 ENCOUNTER — Encounter: Payer: Self-pay | Admitting: Podiatry

## 2018-12-09 ENCOUNTER — Telehealth: Payer: Self-pay | Admitting: *Deleted

## 2018-12-09 NOTE — Telephone Encounter (Signed)
Pt called  States his antibiotic was not at the pharmacy.

## 2018-12-09 NOTE — Telephone Encounter (Signed)
I reviewed Meds & Orders and Dr. Posey Pronto had ordered Doxycycline 12/08/2018 and was confirmed received 6:00pm. Unable to leave a message pt's voice mail had not been set up.

## 2018-12-09 NOTE — Progress Notes (Signed)
Subjective:  Patient ID: Mitchell [name], male    DOB: 02-Apr-1950,  MRN: [id number]  Chief Complaint  Patient presents with  . Foot Ulcer    pt is here for a 2 week ulcer check, pt states that the sutures he has, ripped off the day after he got his sutures taken out, pt also states that he has had to constantly replace his socks, due to the blood    68 y.o. male presents with the above complaint.  Patient presents with follow-up from right third digit ulceration that was primarily closed 2 weeks ago.  Patient states he was looking fine however it has gotten progressively worse over the last couple of days.  Patient states his stitches were removed.  Patient does not recall how he got ripped off.  Patient has a buildup of skin and hyperkeratotic lesion on the third toe.  Patient denies any nausea fever chills vomiting.  He states that she is gapped open.  He has not been compliant with wearing the surgical shoe.  He denies any nausea fever chills vomiting.   Review of Systems: Negative except as noted in the HPI. Denies N/V/F/Ch.  Past Medical History:  Diagnosis Date  . Arthritis   . Cellulitis of right lower extremity 05/09/2014  . Diabetes mellitus without complication ([place])   . Diabetic foot ulcer ([place]) 07/07/2014   Status post bedside debridement of multiple right plantar ulcers by podiatrist, Dr. Laurena Spies.  . Hypercholesterolemia   . Maggot infestation    right foot ulcer  . Obesity 05/09/2014  . Tobacco abuse 07/07/2014    Current Outpatient Medications:  .  aspirin EC 81 MG tablet, Take 81 mg by mouth daily., Disp: , Rfl:  .  canagliflozin (INVOKANA) 300 MG TABS tablet, Take 300 mg by mouth daily before breakfast., Disp: , Rfl:  .  furosemide (LASIX) 20 MG tablet, Take 1 tablet (20 mg total) by mouth daily., Disp: 30 tablet, Rfl: 0 .  gabapentin (NEURONTIN) 300 MG capsule, Take 1 capsule (300 mg total) by mouth 3 (three) times daily., Disp: 90 capsule, Rfl: 0 .  JARDIANCE 25 MG TABS tablet,  , Disp: , Rfl:  .  lovastatin (MEVACOR) 20 MG tablet, Take 1 tablet (20 mg total) by mouth at bedtime., Disp: 30 tablet, Rfl: 0 .  metFORMIN (GLUCOPHAGE) 500 MG tablet, Take 1,000 mg by mouth 2 (two) times daily., Disp: , Rfl:  .  sulfamethoxazole-trimethoprim (BACTRIM DS) 800-160 MG tablet, Take 1 tablet by mouth 2 (two) times daily., Disp: , Rfl:  .  TRESIBA FLEXTOUCH 200 UNIT/ML SOPN, Inject 55 Units into the skin daily., Disp: , Rfl:  .  triamcinolone cream (KENALOG) 0.1 %, APPLY CREAM EXTERNALLY ONCE DAILY AS DIRECTED, Disp: , Rfl:  .  doxycycline (VIBRA-TABS) 100 MG tablet, Take 1 tablet (100 mg total) by mouth 2 (two) times daily., Disp: 28 tablet, Rfl: 0  Social History   Tobacco Use  Smoking Status Current Every Day Smoker  . Packs/day: 1.50  . Types: Cigarettes  Smokeless Tobacco Never Used    Allergies  Allergen Reactions  . Penicillins Itching and Rash    Has patient had a PCN reaction causing immediate rash, facial/tongue/throat swelling, SOB or lightheadedness with hypotension: Yes Has patient had a PCN reaction causing severe rash involving mucus membranes or skin necrosis: No Has patient had a PCN reaction that required hospitalization No Has patient had a PCN reaction occurring within the last 10 years: No If all of the above  answers are "NO", then may proceed with Cephalosporin use.    Objective:  There were no vitals filed for this visit. There is no height or weight on file to calculate BMI. Constitutional Well developed. Well nourished.  Vascular Dorsalis pedis pulses palpable bilaterally. Posterior tibial pulses palpable bilaterally. Capillary refill normal to all digits.  No cyanosis or clubbing noted. Pedal hair growth normal.  Neurologic Normal speech. Oriented to person, place, and time. Epicritic sensation to light touch grossly present bilaterally.  Dermatologic  right third digit 1 cm x 1 cm ulceration noted probing down to bone.  This is changed  from last visit.  No frank pus noted.  Mild erythema noted to the digit.  No other clinical signs of infection noted.  Orthopedic: Normal joint ROM without pain or crepitus bilaterally. No visible deformities. No bony tenderness.   Radiographs: 2 views of skeletally mature adult foot: There is cortical irregularity noted at the distal phalanx of the right third digit consistent with osteomyelitis.  No soft tissue emphysema noted.  No vessel calcification noted. Assessment:   1. Diabetic ulcer of toe of right foot associated with type 2 diabetes mellitus, with fat layer exposed (HCC)   2. Acute osteomyelitis of right ankle or foot (HCC)    Plan:  Patient was evaluated and treated and all questions answered.  Right third digit ulceration with probing down to bone with acute osteomyelitis -I educated the patient that this can sometimes regress quickly as it has happened thus so far.  During his past visit he was progressively really well however this visit has regressed considerably.  Given that the new x-ray shows underlying osteomyelitis, I believe patient will benefit from a partial amputation of the third digit. -Patient states that he has no one to take care of them at home including his dog and would like to have this done as an outpatient if possible.  I explained to him that given that there is osteomyelitis it is more of an urgent situation to do the amputation.  Patient elected to have the amputation done after Thanksgiving. -Given that there are no other signs of infection aside from acute osteomyelitis I have scheduled him to undergo partial third digit amputation on Monday.  I will take biopsy of the clean margin and sent for pathology and micro.  I have placed him on doxycycline for now and will narrow or widen based on culture. -Cam boot was dispensed Wound culture taken for the surgery center. Informed surgical risk consent was reviewed and read aloud to the patient.  I reviewed the  films.  I have discussed my findings with the patient in great detail.  I have discussed all risks including but not limited to infection, stiffness, scarring, limp, disability, deformity, damage to blood vessels and nerves, numbness, poor healing, need for braces, arthritis, chronic pain, amputation, death.  All benefits and realistic expectations discussed in great detail.  I have made no promises as to the outcome.  I have provided realistic expectations.  I have offered the patient a 2nd opinion, which they have declined and assured me they preferred to proceed despite the risks   No follow-ups on file.

## 2018-12-09 NOTE — Telephone Encounter (Signed)
I informed pt the antibiotic had been called in yesterday and pt states he picked.

## 2018-12-10 ENCOUNTER — Telehealth: Payer: Self-pay | Admitting: *Deleted

## 2018-12-10 NOTE — Telephone Encounter (Signed)
DOS 12/15/2018 AMPUTATION TOE INTERPHALANGEAL RIGHT FOOT  3RD DIGIT - 81191  [employer]: Eligibility Date - Jan 15, 2018  Co-Payment - Surgical  In [place]  $200.00 Visit  Co-Insurance - Surgical  In [place]  SPU Surgery  0 %  Deductible - Health Benefit Plan Coverage  Network Not Applicable Individual  Plan / Coverage Date Jan 15, 2018 - Jan 15, 2019  Benefit Date Jan 15, 2018 - Jan 15, 2019 $0.00  Calendar Year  - $0.00 Year to Date  $0.00 Remaining   Out of Pocket (Stop Loss) - Health Benefit Plan Coverage  In Network Individual  Plan / Coverage Date Jan 15, 2018 - Jan 15, 2019  Benefit Date Jan 15, 2018 - Jan 15, 2019 $4,200.00   In Network Individual  $3,306.50 Remaining

## 2018-12-11 LAB — WOUND CULTURE
MICRO NUMBER:: 1130578
SPECIMEN QUALITY:: ADEQUATE

## 2018-12-15 ENCOUNTER — Telehealth: Payer: Self-pay | Admitting: Podiatry

## 2018-12-15 DIAGNOSIS — M86671 Other chronic osteomyelitis, right ankle and foot: Secondary | ICD-10-CM | POA: Diagnosis not present

## 2018-12-15 DIAGNOSIS — E119 Type 2 diabetes mellitus without complications: Secondary | ICD-10-CM | POA: Diagnosis not present

## 2018-12-15 DIAGNOSIS — M86171 Other acute osteomyelitis, right ankle and foot: Secondary | ICD-10-CM | POA: Diagnosis not present

## 2018-12-15 DIAGNOSIS — M86679 Other chronic osteomyelitis, unspecified ankle and foot: Secondary | ICD-10-CM | POA: Diagnosis not present

## 2018-12-15 MED ORDER — OXYCODONE-ACETAMINOPHEN 10-325 MG PO TABS: 1.0000 | ORAL_TABLET | Freq: Three times a day (TID) | ORAL | 0 refills | Status: AC | PRN

## 2018-12-15 MED ORDER — IBUPROFEN 800 MG PO TABS: 800.0000 mg | ORAL_TABLET | Freq: Four times a day (QID) | ORAL | 1 refills | Status: DC | PRN

## 2018-12-15 MED ORDER — OXYCODONE-ACETAMINOPHEN 10-325 MG PO TABS: 1.0000 | ORAL_TABLET | Freq: Three times a day (TID) | ORAL | 0 refills | Status: DC | PRN

## 2018-12-15 MED ORDER — DOXYCYCLINE HYCLATE 100 MG PO TABS: 100.0000 mg | ORAL_TABLET | Freq: Two times a day (BID) | ORAL | 0 refills | Status: DC

## 2018-12-15 NOTE — Telephone Encounter (Signed)
I have sent the new script for percocet to [facility]. Please cancel the [facility] script.  Thanks  [name]

## 2018-12-15 NOTE — Telephone Encounter (Signed)
Faxed required form, orders and diabetic teaching, clinicals and demographics to [facility].

## 2018-12-15 NOTE — Addendum Note (Signed)
Addended by: [name] on: 12/15/2018 09:17 AM   Modules accepted: Orders

## 2018-12-15 NOTE — Telephone Encounter (Signed)
I spoke with pt and he states he is not married. I explained that the task Dr. Posey Pronto had wanted performed on his surgery foot would not be considered skilled nursing and may not be accepted into a [facility] agency, but a [facility] agency may come out to teach him if he felt he could perform. Pt states he feels he would be able to perform the dressing change with betadine if instructed.

## 2018-12-15 NOTE — Telephone Encounter (Signed)
-----   Message from Lolita Rieger sent at 12/15/2018  2:39 PM EST ----- Regarding: FW: [place]  ----- Message ----- From: Felipa Furnace, DPM Sent: 12/15/2018  11:03 AM EST To: Lolita Rieger Subject: Home Care nursing                              [facility],  Would you be able to order home nursing with betadine wet to dry dressing changes Monday, wed, Friday for this patient. He has very macerated tissue when I amputated him today.   Let me know if you need anything else.    Thanks Lennette Bihari

## 2018-12-15 NOTE — Telephone Encounter (Signed)
Received call from [facility] pharmacist and they do not have the generic percocet in stock. They cannot get it until Thursday.  They called and [facility] [place] has it in stock. Can you please send a new Rx to [facility] and [facility] script.  [facility] number is [phone]. This is a surgical pt.

## 2018-12-16 NOTE — Telephone Encounter (Signed)
[facility] sent email stating they would accept pt into their [facility] system.

## 2018-12-17 ENCOUNTER — Encounter: Payer: Self-pay | Admitting: Podiatry

## 2018-12-17 DIAGNOSIS — M86171 Other acute osteomyelitis, right ankle and foot: Secondary | ICD-10-CM | POA: Diagnosis not present

## 2018-12-17 DIAGNOSIS — M1990 Unspecified osteoarthritis, unspecified site: Secondary | ICD-10-CM | POA: Diagnosis not present

## 2018-12-17 DIAGNOSIS — Z4801 Encounter for change or removal of surgical wound dressing: Secondary | ICD-10-CM | POA: Diagnosis not present

## 2018-12-17 DIAGNOSIS — Z4781 Encounter for orthopedic aftercare following surgical amputation: Secondary | ICD-10-CM | POA: Diagnosis not present

## 2018-12-17 DIAGNOSIS — E669 Obesity, unspecified: Secondary | ICD-10-CM | POA: Diagnosis not present

## 2018-12-17 DIAGNOSIS — E7800 Pure hypercholesterolemia, unspecified: Secondary | ICD-10-CM | POA: Diagnosis not present

## 2018-12-17 DIAGNOSIS — R69 Illness, unspecified: Secondary | ICD-10-CM | POA: Diagnosis not present

## 2018-12-17 DIAGNOSIS — Z89421 Acquired absence of other right toe(s): Secondary | ICD-10-CM | POA: Diagnosis not present

## 2018-12-17 DIAGNOSIS — I10 Essential (primary) hypertension: Secondary | ICD-10-CM | POA: Diagnosis not present

## 2018-12-17 DIAGNOSIS — E1169 Type 2 diabetes mellitus with other specified complication: Secondary | ICD-10-CM | POA: Diagnosis not present

## 2018-12-19 DIAGNOSIS — M86171 Other acute osteomyelitis, right ankle and foot: Secondary | ICD-10-CM | POA: Diagnosis not present

## 2018-12-19 DIAGNOSIS — Z4801 Encounter for change or removal of surgical wound dressing: Secondary | ICD-10-CM | POA: Diagnosis not present

## 2018-12-19 DIAGNOSIS — E7800 Pure hypercholesterolemia, unspecified: Secondary | ICD-10-CM | POA: Diagnosis not present

## 2018-12-19 DIAGNOSIS — Z4781 Encounter for orthopedic aftercare following surgical amputation: Secondary | ICD-10-CM | POA: Diagnosis not present

## 2018-12-19 DIAGNOSIS — I10 Essential (primary) hypertension: Secondary | ICD-10-CM | POA: Diagnosis not present

## 2018-12-19 DIAGNOSIS — E1169 Type 2 diabetes mellitus with other specified complication: Secondary | ICD-10-CM | POA: Diagnosis not present

## 2018-12-19 DIAGNOSIS — Z89421 Acquired absence of other right toe(s): Secondary | ICD-10-CM | POA: Diagnosis not present

## 2018-12-19 DIAGNOSIS — M1990 Unspecified osteoarthritis, unspecified site: Secondary | ICD-10-CM | POA: Diagnosis not present

## 2018-12-19 DIAGNOSIS — R69 Illness, unspecified: Secondary | ICD-10-CM | POA: Diagnosis not present

## 2018-12-19 DIAGNOSIS — E669 Obesity, unspecified: Secondary | ICD-10-CM | POA: Diagnosis not present

## 2018-12-22 DIAGNOSIS — Z4801 Encounter for change or removal of surgical wound dressing: Secondary | ICD-10-CM | POA: Diagnosis not present

## 2018-12-22 DIAGNOSIS — E669 Obesity, unspecified: Secondary | ICD-10-CM | POA: Diagnosis not present

## 2018-12-22 DIAGNOSIS — I10 Essential (primary) hypertension: Secondary | ICD-10-CM | POA: Diagnosis not present

## 2018-12-22 DIAGNOSIS — R69 Illness, unspecified: Secondary | ICD-10-CM | POA: Diagnosis not present

## 2018-12-22 DIAGNOSIS — M86171 Other acute osteomyelitis, right ankle and foot: Secondary | ICD-10-CM | POA: Diagnosis not present

## 2018-12-22 DIAGNOSIS — E1169 Type 2 diabetes mellitus with other specified complication: Secondary | ICD-10-CM | POA: Diagnosis not present

## 2018-12-22 DIAGNOSIS — Z89421 Acquired absence of other right toe(s): Secondary | ICD-10-CM | POA: Diagnosis not present

## 2018-12-22 DIAGNOSIS — M1990 Unspecified osteoarthritis, unspecified site: Secondary | ICD-10-CM | POA: Diagnosis not present

## 2018-12-22 DIAGNOSIS — E7800 Pure hypercholesterolemia, unspecified: Secondary | ICD-10-CM | POA: Diagnosis not present

## 2018-12-22 DIAGNOSIS — Z4781 Encounter for orthopedic aftercare following surgical amputation: Secondary | ICD-10-CM | POA: Diagnosis not present

## 2018-12-24 ENCOUNTER — Ambulatory Visit (INDEPENDENT_AMBULATORY_CARE_PROVIDER_SITE_OTHER): Payer: Medicare HMO | Admitting: Podiatry

## 2018-12-24 ENCOUNTER — Other Ambulatory Visit: Payer: Self-pay

## 2018-12-24 ENCOUNTER — Encounter: Payer: Self-pay | Admitting: Podiatry

## 2018-12-24 ENCOUNTER — Ambulatory Visit (INDEPENDENT_AMBULATORY_CARE_PROVIDER_SITE_OTHER): Payer: Medicare HMO

## 2018-12-24 DIAGNOSIS — L97512 Non-pressure chronic ulcer of other part of right foot with fat layer exposed: Secondary | ICD-10-CM | POA: Diagnosis not present

## 2018-12-24 DIAGNOSIS — S98131A Complete traumatic amputation of one right lesser toe, initial encounter: Secondary | ICD-10-CM

## 2018-12-24 DIAGNOSIS — Z9889 Other specified postprocedural states: Secondary | ICD-10-CM

## 2018-12-24 DIAGNOSIS — E11621 Type 2 diabetes mellitus with foot ulcer: Secondary | ICD-10-CM | POA: Diagnosis not present

## 2018-12-24 NOTE — Progress Notes (Signed)
Subjective:  Patient ID: Mitchell [name], male    DOB: [birth year],  MRN: [id number]  Chief Complaint  Patient presents with  . 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 male returns for post-op check.  Patient states is doing really well.  He has had home care nursing doing changes 3 times a week.  They have been applying Betadine wet-to-dry dressing changes to the incision site.  The sutures are intact.  There is some maceration present.  No clinical signs of infection noted at this time.  Review of Systems: Negative except as noted in the HPI. Denies N/V/F/Ch.  Past Medical History:  Diagnosis Date  . Arthritis   . Cellulitis of right lower extremity 05/09/2014  . Diabetes mellitus without complication ([place])   . Diabetic foot ulcer ([place]) 07/07/2014   Status post bedside debridement of multiple right plantar ulcers by podiatrist, Dr. Laurena Spies.  . Hypercholesterolemia   . Maggot infestation    right foot ulcer  . Obesity 05/09/2014  . Tobacco abuse 07/07/2014    Current Outpatient Medications:  .  aspirin EC 81 MG tablet, Take 81 mg by mouth daily., Disp: , Rfl:  .  canagliflozin (INVOKANA) 300 MG TABS tablet, Take 300 mg by mouth daily before breakfast., Disp: , Rfl:  .  doxycycline (VIBRA-TABS) 100 MG tablet, Take 1 tablet (100 mg total) by mouth 2 (two) times daily., Disp: 28 tablet, Rfl: 0 .  furosemide (LASIX) 20 MG tablet, Take 1 tablet (20 mg total) by mouth daily., Disp: 30 tablet, Rfl: 0 .  gabapentin (NEURONTIN) 300 MG capsule, Take 1 capsule (300 mg total) by mouth 3 (three) times daily., Disp: 90 capsule, Rfl: 0 .  ibuprofen (ADVIL) 800 MG tablet, Take 1 tablet (800 mg total) by mouth every 6 (six) hours as needed., Disp: 60 tablet, Rfl: 1 .  JARDIANCE 25 MG TABS tablet, , Disp: , Rfl:  .  lovastatin (MEVACOR) 20 MG tablet, Take 1 tablet (20 mg total) by mouth at  bedtime., Disp: 30 tablet, Rfl: 0 .  metFORMIN (GLUCOPHAGE) 500 MG tablet, Take 1,000 mg by mouth 2 (two) times daily., Disp: , Rfl:  .  NOVOLOG FLEXPEN 100 UNIT/ML FlexPen, INJECT 15 UNITS SUBCUTANEOUSLY ONCE DAILY WITH BREAKFAST 30 UNITS WITH LUNCH AND 30 UNITS WITH DINNER. MAXIMUM DOSE OF 100 UNITS PER DAY, Disp: , Rfl:  .  sulfamethoxazole-trimethoprim (BACTRIM DS) 800-160 MG tablet, Take 1 tablet by mouth 2 (two) times daily., Disp: , Rfl:  .  TRESIBA FLEXTOUCH 200 UNIT/ML SOPN, Inject 55 Units into the skin daily., Disp: , Rfl:  .  triamcinolone cream (KENALOG) 0.1 %, APPLY CREAM EXTERNALLY ONCE DAILY AS DIRECTED, Disp: , Rfl:   Social History   Tobacco Use  Smoking Status Current Every Day Smoker  . Packs/day: 1.50  . Types: Cigarettes  Smokeless Tobacco Never Used    Allergies  Allergen Reactions  . Penicillins Itching and Rash    Has patient had a PCN reaction causing immediate rash, facial/tongue/throat swelling, SOB or lightheadedness with hypotension: Yes Has patient had a PCN reaction causing severe rash involving mucus membranes or skin necrosis: No Has patient had a PCN reaction that required hospitalization No Has patient had a PCN reaction occurring within the last 10 years: No If all of the above answers are "NO", then may proceed with Cephalosporin use.  Objective:  There were no vitals filed for this visit. There is no height or weight on file to calculate BMI. Constitutional Well developed. Well nourished.  Vascular Foot warm and well perfused. Capillary refill normal to all digits.   Neurologic Normal speech. Oriented to person, place, and time. Epicritic sensation to light touch grossly present bilaterally.  Dermatologic Skin healing well without signs of infection. Skin edges well coapted without signs of infection.  Orthopedic: Tenderness to palpation noted about the surgical site.   Radiographs: 2 views of skeletally mature adult foot: Third digit  disarticulation amputation noted.  No cortical irregularity noted.  Bone biopsy of the third metatarsal head noted.  No soft tissue gas noted. Assessment:   1. Diabetic ulcer of toe of right foot associated with type 2 diabetes mellitus, with fat layer exposed (HCC)    Plan:  Patient was evaluated and treated and all questions answered.  S/p foot surgery right -Progressing as expected post-operatively. -XR:  -WB Status: Weightbearing as tolerated in cam boot -Sutures: Intact.  No signs of dehiscence.  Maceration noted.  No erythema or clinical signs of infection noted. -Medications: None -Foot redressed aggressive Betadine wet-to-dry dressing  No follow-ups on file.

## 2018-12-29 ENCOUNTER — Other Ambulatory Visit: Payer: Self-pay

## 2018-12-29 ENCOUNTER — Ambulatory Visit (INDEPENDENT_AMBULATORY_CARE_PROVIDER_SITE_OTHER): Payer: Self-pay | Admitting: Podiatry

## 2018-12-29 DIAGNOSIS — L97512 Non-pressure chronic ulcer of other part of right foot with fat layer exposed: Secondary | ICD-10-CM

## 2018-12-29 DIAGNOSIS — E11621 Type 2 diabetes mellitus with foot ulcer: Secondary | ICD-10-CM

## 2018-12-29 DIAGNOSIS — Z89431 Acquired absence of right foot: Secondary | ICD-10-CM | POA: Diagnosis not present

## 2018-12-29 DIAGNOSIS — S98131A Complete traumatic amputation of one right lesser toe, initial encounter: Secondary | ICD-10-CM

## 2018-12-29 DIAGNOSIS — Z9889 Other specified postprocedural states: Secondary | ICD-10-CM

## 2018-12-30 NOTE — Progress Notes (Signed)
   Subjective:  Patient presents today status post third digit amputation right foot performed by Dr. Posey Pronto. DOS: 12/15/2018.  Patient denies pain.  He states that the home health nurses coming to his home once per week for dressing change.  He is concerned for infection because he states that there is redness and drainage to the amputation site.  Currently there is no active bleeding.  He presents for further treatment evaluation  Past Medical History:  Diagnosis Date  . Arthritis   . Cellulitis of right lower extremity 05/09/2014  . Diabetes mellitus without complication ([place])   . Diabetic foot ulcer ([place]) 07/07/2014   Status post bedside debridement of multiple right plantar ulcers by podiatrist, Dr. Laurena Spies.  . Hypercholesterolemia   . Maggot infestation    right foot ulcer  . Obesity 05/09/2014  . Tobacco abuse 07/07/2014        Objective/Physical Exam Vascular status intact.  There continues to be heavy drainage to the amputation site with skin maceration.  Sutures are intact however.  Moderate malodor noted.  Assessment: 1. s/p third toe amputation right foot. DOS: 12/15/2018   Plan of Care:  1. Patient was evaluated. 2.  Continue Betadine wet-to-dry dressings daily 3.  Continue oral antibiotics as prescribed 4.  Patient has a follow-up appointment on 1216 with Dr. Posey Pronto.  Continue management and follow-up with surgeon   Edrick Kins, DPM [facility]  Dr. Edrick Kins, [address] [place]                                        [place], [place] [address]                Office [phone]  Fax [phone]

## 2018-12-31 ENCOUNTER — Ambulatory Visit (INDEPENDENT_AMBULATORY_CARE_PROVIDER_SITE_OTHER): Payer: Medicare HMO | Admitting: Podiatry

## 2018-12-31 ENCOUNTER — Encounter: Payer: Self-pay | Admitting: Podiatry

## 2018-12-31 ENCOUNTER — Telehealth: Payer: Self-pay | Admitting: *Deleted

## 2018-12-31 ENCOUNTER — Other Ambulatory Visit: Payer: Self-pay

## 2018-12-31 DIAGNOSIS — Z9889 Other specified postprocedural states: Secondary | ICD-10-CM

## 2018-12-31 DIAGNOSIS — S98131A Complete traumatic amputation of one right lesser toe, initial encounter: Secondary | ICD-10-CM

## 2018-12-31 DIAGNOSIS — L97512 Non-pressure chronic ulcer of other part of right foot with fat layer exposed: Secondary | ICD-10-CM

## 2018-12-31 DIAGNOSIS — E11621 Type 2 diabetes mellitus with foot ulcer: Secondary | ICD-10-CM

## 2018-12-31 NOTE — Progress Notes (Signed)
Subjective:  Patient ID: Mitchell [name], male    DOB: 03/21/1950,  MRN: [id number]  Chief Complaint  Patient presents with  . 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 male returns for post-op check.  Patient presents with maceration of the right third digit amputation site.  Patient is fluid overloaded and is currently leaking through his incision site.  This is causing excessive maceration to the surrounding tissue.  I am worried that the incision may not heal properly given the amount of maceration there is present.  I believe that he will benefit from a wound care center to aggressively manage this wound.  He denies any nausea fever chills vomiting.  He has almost complete his antibiotic therapy.  Review of Systems: Negative except as noted in the HPI. Denies N/V/F/Ch.  Past Medical History:  Diagnosis Date  . Arthritis   . Cellulitis of right lower extremity 05/09/2014  . Diabetes mellitus without complication (HCC)   . Diabetic foot ulcer (HCC) 07/07/2014   Status post bedside debridement of multiple right plantar ulcers by podiatrist, Dr. Reynolds Bowl.  . Hypercholesterolemia   . Maggot infestation    right foot ulcer  . Obesity 05/09/2014  . Tobacco abuse 07/07/2014    Current Outpatient Medications:  .  aspirin EC 81 MG tablet, Take 81 mg by mouth daily., Disp: , Rfl:  .  canagliflozin (INVOKANA) 300 MG TABS tablet, Take 300 mg by mouth daily before breakfast., Disp: , Rfl:  .  doxycycline (VIBRA-TABS) 100 MG tablet, Take 1 tablet (100 mg total) by mouth 2 (two) times daily., Disp: 28 tablet, Rfl: 0 .  furosemide (LASIX) 20 MG tablet, Take 1 tablet (20 mg total) by mouth daily., Disp: 30 tablet, Rfl: 0 .  gabapentin (NEURONTIN) 300 MG capsule, Take 1 capsule (300 mg total) by mouth 3 (three) times daily., Disp: 90 capsule, Rfl: 0 .  ibuprofen (ADVIL) 800 MG tablet, Take 1 tablet (800 mg total) by mouth every 6 (six) hours as needed., Disp: 60  tablet, Rfl: 1 .  JARDIANCE 25 MG TABS tablet, , Disp: , Rfl:  .  lovastatin (MEVACOR) 20 MG tablet, Take 1 tablet (20 mg total) by mouth at bedtime., Disp: 30 tablet, Rfl: 0 .  metFORMIN (GLUCOPHAGE) 500 MG tablet, Take 1,000 mg by mouth 2 (two) times daily., Disp: , Rfl:  .  NOVOLOG FLEXPEN 100 UNIT/ML FlexPen, INJECT 15 UNITS SUBCUTANEOUSLY ONCE DAILY WITH BREAKFAST 30 UNITS WITH LUNCH AND 30 UNITS WITH DINNER. MAXIMUM DOSE OF 100 UNITS PER DAY, Disp: , Rfl:  .  sulfamethoxazole-trimethoprim (BACTRIM DS) 800-160 MG tablet, Take 1 tablet by mouth 2 (two) times daily., Disp: , Rfl:  .  TRESIBA FLEXTOUCH 200 UNIT/ML SOPN, Inject 55 Units into the skin daily., Disp: , Rfl:  .  triamcinolone cream (KENALOG) 0.1 %, APPLY CREAM EXTERNALLY ONCE DAILY AS DIRECTED, Disp: , Rfl:   Social History   Tobacco Use  Smoking Status Current Every Day Smoker  . Packs/day: 1.50  . Types: Cigarettes  Smokeless Tobacco Never Used    Allergies  Allergen Reactions  . Penicillins Itching and Rash    Has patient had a PCN reaction causing immediate rash, facial/tongue/throat swelling, SOB or lightheadedness with hypotension: Yes Has patient had a PCN reaction causing severe rash involving mucus membranes or skin necrosis: No Has patient had a PCN reaction that required hospitalization No Has patient had a PCN reaction occurring within  the last 10 years: No If all of the above answers are "NO", then may proceed with Cephalosporin use.    Objective:  There were no vitals filed for this visit. There is no height or weight on file to calculate BMI. Constitutional Well developed. Well nourished.  Vascular Foot warm and well perfused. Capillary refill normal to all digits.   Neurologic Normal speech. Oriented to person, place, and time. Epicritic sensation to light touch grossly present bilaterally.  Dermatologic  did skin has healed really well.  Without any acute fracture.  However superficial skin is  progressively macerated with maceration extending in the first interspace as well as the dorsal foot.  Patient is very fluid overloaded  Orthopedic: Tenderness to palpation noted about the surgical site.   Radiographs: None Assessment:   1. Amputation of toe of right foot ([place])   2. Status post foot surgery   3. Diabetic ulcer of toe of right foot associated with type 2 diabetes mellitus, with fat layer exposed ([place])    Plan:  Patient was evaluated and treated and all questions answered.  S/p foot surgery right -Progressing as expected post-operatively. -XR: None -WB Status: Weightbearing as tolerated in cam boot -Sutures: Removed.  Deep closure noted however superficial skin is very macerated and not completely closed. -Medications: None -Foot redressed aggressively with Betadine wet-to-dry dressings -I explained to the patient that he will be better managed at the ED and wound care center.  Given the amount of maceration noted he needs aggressive Betadine wet-to-dry dressing changes.  Patient's home care nurse has not been able to come by as often due to demand.  And he is unable to take care of himself. -He has a incision wound measuring 1 cm x 0.5 cm with surrounding macerated tissue.  Aggressive Betadine wet-to-dry dressing was applied to the incision site.  Return in about 2 weeks (around 01/14/2019) for  post op visit with xray.

## 2018-12-31 NOTE — Telephone Encounter (Signed)
Faxed Wound Care referral to [facility] with clinicals and demographics.

## 2019-01-01 DIAGNOSIS — I10 Essential (primary) hypertension: Secondary | ICD-10-CM | POA: Diagnosis not present

## 2019-01-01 DIAGNOSIS — Z4781 Encounter for orthopedic aftercare following surgical amputation: Secondary | ICD-10-CM | POA: Diagnosis not present

## 2019-01-01 DIAGNOSIS — E7800 Pure hypercholesterolemia, unspecified: Secondary | ICD-10-CM | POA: Diagnosis not present

## 2019-01-01 DIAGNOSIS — E669 Obesity, unspecified: Secondary | ICD-10-CM | POA: Diagnosis not present

## 2019-01-01 DIAGNOSIS — M1990 Unspecified osteoarthritis, unspecified site: Secondary | ICD-10-CM | POA: Diagnosis not present

## 2019-01-01 DIAGNOSIS — R69 Illness, unspecified: Secondary | ICD-10-CM | POA: Diagnosis not present

## 2019-01-01 DIAGNOSIS — M86171 Other acute osteomyelitis, right ankle and foot: Secondary | ICD-10-CM | POA: Diagnosis not present

## 2019-01-01 DIAGNOSIS — E1169 Type 2 diabetes mellitus with other specified complication: Secondary | ICD-10-CM | POA: Diagnosis not present

## 2019-01-01 DIAGNOSIS — Z4801 Encounter for change or removal of surgical wound dressing: Secondary | ICD-10-CM | POA: Diagnosis not present

## 2019-01-01 DIAGNOSIS — Z89421 Acquired absence of other right toe(s): Secondary | ICD-10-CM | POA: Diagnosis not present

## 2019-01-07 DIAGNOSIS — E119 Type 2 diabetes mellitus without complications: Secondary | ICD-10-CM | POA: Diagnosis not present

## 2019-01-07 DIAGNOSIS — Z89421 Acquired absence of other right toe(s): Secondary | ICD-10-CM | POA: Diagnosis not present

## 2019-01-07 DIAGNOSIS — Z4781 Encounter for orthopedic aftercare following surgical amputation: Secondary | ICD-10-CM | POA: Diagnosis not present

## 2019-01-07 DIAGNOSIS — E7800 Pure hypercholesterolemia, unspecified: Secondary | ICD-10-CM | POA: Diagnosis not present

## 2019-01-07 DIAGNOSIS — M1990 Unspecified osteoarthritis, unspecified site: Secondary | ICD-10-CM | POA: Diagnosis not present

## 2019-01-07 DIAGNOSIS — I10 Essential (primary) hypertension: Secondary | ICD-10-CM | POA: Diagnosis not present

## 2019-01-07 DIAGNOSIS — R69 Illness, unspecified: Secondary | ICD-10-CM | POA: Diagnosis not present

## 2019-01-07 DIAGNOSIS — Z4801 Encounter for change or removal of surgical wound dressing: Secondary | ICD-10-CM | POA: Diagnosis not present

## 2019-01-07 DIAGNOSIS — M86171 Other acute osteomyelitis, right ankle and foot: Secondary | ICD-10-CM | POA: Diagnosis not present

## 2019-01-07 DIAGNOSIS — E669 Obesity, unspecified: Secondary | ICD-10-CM | POA: Diagnosis not present

## 2019-01-07 DIAGNOSIS — E1169 Type 2 diabetes mellitus with other specified complication: Secondary | ICD-10-CM | POA: Diagnosis not present

## 2019-01-12 DIAGNOSIS — E1129 Type 2 diabetes mellitus with other diabetic kidney complication: Secondary | ICD-10-CM | POA: Diagnosis not present

## 2019-01-14 ENCOUNTER — Ambulatory Visit (INDEPENDENT_AMBULATORY_CARE_PROVIDER_SITE_OTHER): Payer: Medicare HMO

## 2019-01-14 ENCOUNTER — Ambulatory Visit (INDEPENDENT_AMBULATORY_CARE_PROVIDER_SITE_OTHER): Payer: Medicare HMO | Admitting: Podiatry

## 2019-01-14 ENCOUNTER — Other Ambulatory Visit: Payer: Self-pay

## 2019-01-14 DIAGNOSIS — Z9889 Other specified postprocedural states: Secondary | ICD-10-CM

## 2019-01-14 DIAGNOSIS — M80871A Other osteoporosis with current pathological fracture, right ankle and foot, initial encounter for fracture: Secondary | ICD-10-CM | POA: Diagnosis not present

## 2019-01-14 DIAGNOSIS — S98131A Complete traumatic amputation of one right lesser toe, initial encounter: Secondary | ICD-10-CM

## 2019-01-15 ENCOUNTER — Encounter: Payer: Self-pay | Admitting: Podiatry

## 2019-01-15 ENCOUNTER — Other Ambulatory Visit: Payer: Self-pay

## 2019-01-15 DIAGNOSIS — M80871A Other osteoporosis with current pathological fracture, right ankle and foot, initial encounter for fracture: Secondary | ICD-10-CM

## 2019-01-15 NOTE — Progress Notes (Signed)
Subjective:  Patient ID: Mitchell [name], male    DOB: 1950/11/06,  MRN: [id number]  Chief Complaint  Patient presents with  . 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 male returns for post-op check.  Patient maceration has improved a little bit.  However am still worried about the amputation site as it is still probing to the deep tissue.  Patient states that he would like to start taking shower however I told him that he cannot absolutely get it wet at all.  Patient denies any other acute complaints.  He states he has been doing regular dressing changes with aggressive Betadine wet-to-dry dressings.  Review of Systems: Negative except as noted in the HPI. Denies N/V/F/Ch.  Past Medical History:  Diagnosis Date  . Arthritis   . Cellulitis of right lower extremity 05/09/2014  . Diabetes mellitus without complication (HCC)   . Diabetic foot ulcer ([facility]) 07/07/2014   Status post bedside debridement of multiple right plantar ulcers by podiatrist, Dr. Reynolds Bowl.  . Hypercholesterolemia   . Maggot infestation    right foot ulcer  . Obesity 05/09/2014  . Tobacco abuse 07/07/2014    Current Outpatient Medications:  .  aspirin EC 81 MG tablet, Take 81 mg by mouth daily., Disp: , Rfl:  .  canagliflozin (INVOKANA) 300 MG TABS tablet, Take 300 mg by mouth daily before breakfast., Disp: , Rfl:  .  doxycycline (VIBRA-TABS) 100 MG tablet, Take 1 tablet (100 mg total) by mouth 2 (two) times daily., Disp: 28 tablet, Rfl: 0 .  furosemide (LASIX) 20 MG tablet, Take 1 tablet (20 mg total) by mouth daily., Disp: 30 tablet, Rfl: 0 .  gabapentin (NEURONTIN) 300 MG capsule, Take 1 capsule (300 mg total) by mouth 3 (three) times daily., Disp: 90 capsule, Rfl: 0 .  ibuprofen (ADVIL) 800 MG tablet, Take 1 tablet (800 mg total) by mouth every 6 (six) hours as needed., Disp: 60 tablet, Rfl: 1 .  JARDIANCE 25 MG TABS tablet, , Disp: , Rfl:  .  lovastatin (MEVACOR) 20 MG tablet,  Take 1 tablet (20 mg total) by mouth at bedtime., Disp: 30 tablet, Rfl: 0 .  metFORMIN (GLUCOPHAGE) 500 MG tablet, Take 1,000 mg by mouth 2 (two) times daily., Disp: , Rfl:  .  NOVOLOG FLEXPEN 100 UNIT/ML FlexPen, INJECT 15 UNITS SUBCUTANEOUSLY ONCE DAILY WITH BREAKFAST 30 UNITS WITH LUNCH AND 30 UNITS WITH DINNER. MAXIMUM DOSE OF 100 UNITS PER DAY, Disp: , Rfl:  .  sulfamethoxazole-trimethoprim (BACTRIM DS) 800-160 MG tablet, Take 1 tablet by mouth 2 (two) times daily., Disp: , Rfl:  .  TRESIBA FLEXTOUCH 200 UNIT/ML SOPN, Inject 55 Units into the skin daily., Disp: , Rfl:  .  triamcinolone cream (KENALOG) 0.1 %, APPLY CREAM EXTERNALLY ONCE DAILY AS DIRECTED, Disp: , Rfl:   Social History   Tobacco Use  Smoking Status Current Every Day Smoker  . Packs/day: 1.50  . Types: Cigarettes  Smokeless Tobacco Never Used    Allergies  Allergen Reactions  . Penicillins Itching and Rash    Has patient had a PCN reaction causing immediate rash, facial/tongue/throat swelling, SOB or lightheadedness with hypotension: Yes Has patient had a PCN reaction causing severe rash involving mucus membranes or skin necrosis: No Has patient had a PCN reaction that required hospitalization No Has patient had a PCN reaction occurring within the last 10 years: No If all of the above answers are "NO", then  may proceed with Cephalosporin use.    Objective:  There were no vitals filed for this visit. There is no height or weight on file to calculate BMI. Constitutional Well developed. Well nourished.  Vascular Foot warm and well perfused. Capillary refill normal to all digits.   Neurologic Normal speech. Oriented to person, place, and time. Epicritic sensation to light touch grossly present bilaterally.  Dermatologic  mild dehiscence noted with central probe down to deep tissue.  There is still maceration presents to the surrounding skin.  Patient is extremely fluid overloaded and is causing excessive amount of  fluid to be expelled from the body into the surrounding tissue.  No other clinical signs of infection noted.  Orthopedic: Tenderness to palpation noted about the surgical site.   Radiographs: 2 views of skeletally mature adult foot: There appears to be a pathologic fracture versus stress fracture of the metatarsal head to 3.  They appear to be in good alignment.  However in the setting of an infection osteomyelitis cannot be ruled out. Assessment:   1. Amputation of toe of right foot ([place])   2. Pathological fracture of right foot due to other osteoporosis, initial encounter   3. Status post foot surgery    Plan:  Patient was evaluated and treated and all questions answered.  S/p foot surgery right -Progressing as expected post-operatively. -XR: See above -WB Status: Weightbearing as tolerated in cam boot -Sutures: Removed.  Deep closure noted however superficial skin is very macerated and not completely closed. 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  I explained to the patient if this comes back as positive for infection he will end up losing all of his toes and I will need to perform a transmetatarsal amputation given that he has a history of a partial fifth metatarsal resection.  Patient agrees with this plan and will obtain an MRI as soon as possible. -He has a incision wound measuring 1 cm x 0.5 cm with surrounding macerated tissue.  Aggressive Betadine wet-to-dry dressing was applied to the incision site.  Return in about 2 weeks (around 01/28/2019).

## 2019-01-15 NOTE — Progress Notes (Signed)
Mr right foot

## 2019-01-15 NOTE — Addendum Note (Signed)
Addended by: [name] on: 01/15/2019 01:15 PM   Modules accepted: Level of Service

## 2019-01-19 ENCOUNTER — Telehealth: Payer: Self-pay | Admitting: Podiatry

## 2019-01-19 ENCOUNTER — Telehealth: Payer: Self-pay | Admitting: *Deleted

## 2019-01-19 DIAGNOSIS — Z01812 Encounter for preprocedural laboratory examination: Secondary | ICD-10-CM

## 2019-01-19 DIAGNOSIS — M869 Osteomyelitis, unspecified: Secondary | ICD-10-CM

## 2019-01-19 NOTE — Telephone Encounter (Signed)
Pt states the [facility] has called and helped him schedule.

## 2019-01-19 NOTE — Telephone Encounter (Signed)
Orders to Timoteo [employer], CMA for pre-cert, faxed to [facility].

## 2019-01-19 NOTE — Telephone Encounter (Signed)
Pt called to see about setting appt for MRI

## 2019-01-19 NOTE — Telephone Encounter (Signed)
-----   Message from Candelaria Stagers, DPM sent at 01/15/2019  1:01 PM EST ----- Regarding: MRI of the right foot Hi,  Would you guys be able to order MRI of the right foot to rule out osteomyelitis of the second metatarsal as well as the third metatarsal.  Thanks Caryn Bee

## 2019-01-21 ENCOUNTER — Other Ambulatory Visit: Payer: Self-pay

## 2019-01-21 ENCOUNTER — Ambulatory Visit (HOSPITAL_COMMUNITY): Payer: Medicare HMO | Attending: Podiatry | Admitting: Physical Therapy

## 2019-01-21 ENCOUNTER — Encounter (HOSPITAL_COMMUNITY): Payer: Self-pay | Admitting: Physical Therapy

## 2019-01-21 DIAGNOSIS — S91301A Unspecified open wound, right foot, initial encounter: Secondary | ICD-10-CM

## 2019-01-21 DIAGNOSIS — S98131A Complete traumatic amputation of one right lesser toe, initial encounter: Secondary | ICD-10-CM

## 2019-01-21 DIAGNOSIS — R262 Difficulty in walking, not elsewhere classified: Secondary | ICD-10-CM | POA: Diagnosis present

## 2019-01-21 DIAGNOSIS — S199XXA Unspecified injury of neck, initial encounter: Secondary | ICD-10-CM | POA: Diagnosis not present

## 2019-01-21 DIAGNOSIS — R0781 Pleurodynia: Secondary | ICD-10-CM | POA: Diagnosis not present

## 2019-01-21 DIAGNOSIS — S42201A Unspecified fracture of upper end of right humerus, initial encounter for closed fracture: Secondary | ICD-10-CM | POA: Diagnosis not present

## 2019-01-21 DIAGNOSIS — S59911A Unspecified injury of right forearm, initial encounter: Secondary | ICD-10-CM | POA: Diagnosis not present

## 2019-01-21 DIAGNOSIS — M542 Cervicalgia: Secondary | ICD-10-CM | POA: Diagnosis not present

## 2019-01-21 DIAGNOSIS — M79631 Pain in right forearm: Secondary | ICD-10-CM | POA: Diagnosis not present

## 2019-01-21 DIAGNOSIS — S0990XA Unspecified injury of head, initial encounter: Secondary | ICD-10-CM | POA: Diagnosis not present

## 2019-01-21 DIAGNOSIS — M79641 Pain in right hand: Secondary | ICD-10-CM | POA: Diagnosis not present

## 2019-01-21 DIAGNOSIS — S42291A Other displaced fracture of upper end of right humerus, initial encounter for closed fracture: Secondary | ICD-10-CM | POA: Diagnosis not present

## 2019-01-21 DIAGNOSIS — S6991XA Unspecified injury of right wrist, hand and finger(s), initial encounter: Secondary | ICD-10-CM | POA: Diagnosis not present

## 2019-01-21 DIAGNOSIS — R519 Headache, unspecified: Secondary | ICD-10-CM | POA: Diagnosis not present

## 2019-01-21 NOTE — Therapy (Signed)
[place], [place], [address] Phone: [phone]   Fax:  [phone]  Wound Care Evaluation  Patient Details  Name: Mitchell [name] MRN: [id number] Date of Birth: [birth year] Referring Provider (PT): [name]   Encounter Date: 01/21/2019  PT End of Session - 01/21/19 1311    Visit Number  1    Number of Visits  16    Date for PT Re-Evaluation  03/18/19    Authorization Type  [employer]    Authorization Time Period  POC dates 01/21/19 to 03/18/2019    Authorization - Visit Number  1    Authorization - Number of Visits  10    PT Start Time  0915    PT Stop Time  1010    PT Time Calculation (min)  55 min    Activity Tolerance  Patient tolerated treatment well;No increased pain    Behavior During Therapy  WFL for tasks assessed/performed       Past Medical History:  Diagnosis Date  . Arthritis   . Cellulitis of right lower extremity 05/09/2014  . Diabetes mellitus without complication ([place])   . Diabetic foot ulcer ([place]) 07/07/2014   Status post bedside debridement of multiple right plantar ulcers by podiatrist, Dr. Laurena Spies.  . Hypercholesterolemia   . Maggot infestation    right foot ulcer  . Obesity 05/09/2014  . Tobacco abuse 07/07/2014    Past Surgical History:  Procedure Laterality Date  . APPENDECTOMY    . PERIPHERAL VASCULAR CATHETERIZATION N/A 07/22/2015   Procedure: Abdominal Aortogram w/Lower Extremity;  Surgeon: Elam Dutch, MD;  Location: [facility];  Service: Cardiovascular;  Laterality: N/A;    There were no vitals filed for this visit.    [facility] PT Assessment - 01/21/19 0001      Assessment   Medical Diagnosis  diabetic ulcer of toe of right foot     Referring Provider (PT)  [name]    Onset Date/Surgical Date  12/15/18    Next MD Visit  01/28/19   MRI on 02/13/19     Precautions   Precautions  Other (comment)   WBAT in CAM boot     Restrictions   Weight Bearing Restrictions   Yes    RLE Weight Bearing  Weight bearing as tolerated    Other Position/Activity Restrictions  in cam boot      Balance Screen   Has the patient fallen in the past 6 months  No    Has the patient had a decrease in activity level because of a fear of falling?   Yes    Is the patient reluctant to leave their home because of a fear of falling?   Yes      [place] residence    Living Arrangements  Alone    Available Help at Discharge  Family    Type of [place]      Prior Function   Level of [place]   Overall Cognitive Status  Within Functional Limits for tasks assessed      Wound Therapy - 01/21/19 1411    Subjective  Patient states he had his toe amputated on 12/15/18. He was told to wear a CAM boot but does not know of a fracture (when asked about fracture in foot from treating PT which was obtained information during chart review). Since the  operation he has had increased drainage in his foot and the MD removed the stitches. States that his son has been changing the dressing daily and applying betadine and then wrapping his foot with gauze and an ace bandage. States that he has pain at the end of the day and the bandages tend to fall down so they dig into his skin. States he really wants to shower but he has been instructed to keep his skin dry so he hasn't even been able to wash his leg. States that he wants to keep his foot but the MD is concerned there is a deeper infection and they are awaiting MRI results to determine extent of infection. Patient follows up with MD's office once a week. Reports he has intermittent pain with walking and sleeping in his foot.     Patient and Family Stated Goals  for incision/wound to heal    Date of Onset  12/15/18   DOS   Prior Treatments  betadine, wet to dry dressings, amputation of thrid toe on R foot    Pain Scale  Faces    Faces Pain Scale  Hurts little more    Pain Type   Acute pain;Surgical pain    Pain Location  Foot    Pain Orientation  Right;Distal    Pain Descriptors / Indicators  Aching;Discomfort    Pain Onset  Unable to tell    Patients Stated Pain Goal  0    Pain Intervention(s)  Repositioned    Multiple Pain Sites  No    Evaluation and Treatment Procedures Explained to Patient/Family  Yes    Evaluation and Treatment Procedures  agreed to    Wound Properties Date First Assessed: 01/21/19 Time First Assessed: 0915 Wound Type: Diabetic ulcer;Incision - Dehisced Location: Foot Location Orientation: Right;Distal , at base of where third toe would be  Wound Description (Comments): incision site between second and forth toes on right foot Present on Admission: Yes   Dressing Type  Gauze (Comment);Compression wrap   gauze with medihoney, toe wraps, profore   Dressing Changed  Changed    Dressing Status  Old drainage    Dressing Change Frequency  PRN    Site / Wound Assessment  Other (Comment)   unable to see wound bed secondary to edema in toes   % Wound base Red or Granulating  0%    % Wound base Other/Granulation Tissue (Comment)  10%    Peri-wound Assessment  Maceration;Induration;Hemosiderin;Edema    Wound Length (cm)  3 cm    Wound Width (cm)  0.4 cm    Wound Depth (cm)  1 cm   difficult to determine true depth due to swelling   Wound Volume (cm^3)  1.2 cm^3    Wound Surface Area (cm^2)  1.2 cm^2    Tunneling (cm)  at 12 oclove towards metatarsal    Undermining (cm)  unable to determine on this date    Margins  Unattached edges (unapproximated)    Drainage Amount  Moderate    Drainage Description  Odor;Purulent    Treatment  Cleansed;Debridement (Selective);Packing (Dry gauze)    Selective Debridement - Location  distal right foot between 2nd and 4th toes    Selective Debridement - Tools Used  Forceps    Selective Debridement - Tissue Removed  devitalized tissue, dry flaky skin    Wound Therapy - Clinical Statement  Patient presents to  clinic s/p right third toe amputation on 12/15/18. He has been closely followed  by his MD who was concerned as the incision site presents with significant drainage causing maceration to the incision and surrounding tissue. Patient presents to clinic with induration throughout right lower leg, swelling in in right lower leg and toes and severe maceration of the surrounding tissue. Unable to get proper assessment of wound bed secondary to severe swelling of toes and lower leg. Educated patient on wound care and how wound care can help with proper wound healing. Patient would make a great candidate for skilled physical therapy to promote proper wound healing secondary to venous insufficiency, pain associated with functional tasks and diabetes that are keeping wound from properly healing.     Wound Therapy - Functional Problem List  pain with walking, standing and sleeping    Factors Delaying/Impairing Wound Healing  Diabetes Mellitus;Vascular compromise;Multiple medical problems    Hydrotherapy Plan  Other (comment)   pulsative lavage with suction as needed   Wound Therapy - Frequency  2X / week    Wound Therapy - Current Recommendations  PT    Wound Plan  continue with toe wraps, debridement of  incision/wound, profore    Dressing   toe wraps, medihoney on gauze, then Kerlix, then profore, vasoline around incision site to help with macerated tissue              Objective measurements completed on examination: See above findings.            PT Education - 01/21/19 1438    Education Details  on wound care. How to properly remove compression bandage if needed due to pain. On keeping bandages dry    Person(s) Educated  Patient    Methods  Explanation    Comprehension  Verbalized understanding       PT Short Term Goals - 01/21/19 1548      PT SHORT TERM GOAL #1   Title  PT wound to be 50% granulated    Time  4    Period  Weeks    Status  New    Target Date  02/18/19      PT  SHORT TERM GOAL #2   Title  Patient will demonstrate at least 50% improvement in swelling in toes per visual inspection by PT    Time  4    Period  Weeks    Status  New    Target Date  02/18/19        PT Long Term Goals - 01/21/19 1550      PT LONG TERM GOAL #1   Title  Patient will report no more then 2/10 pain in his foot to improve QOL.    Time  8    Period  Weeks    Status  New    Target Date  03/18/19      PT LONG TERM GOAL #2   Title  Wound will be 100% granulated    Time  8    Period  Weeks    Status  New    Target Date  03/18/19           Plan - 01/21/19 1404    Clinical Impression Statement  see above    Personal Factors and Comorbidities  Age;Comorbidity 1;Comorbidity 2;Fitness    Examination-Activity Limitations  Bathing;Dressing;Hygiene/Grooming;Locomotion Level;Stand;Stairs;Squat;Sleep;Transfers    Examination-Participation Restrictions  Cleaning;Driving;Community Activity    Stability/Clinical Decision Making  Evolving/Moderate complexity    Clinical Decision Making  Moderate    Rehab Potential  Good  PT Frequency  2x / week    PT Duration  8 weeks    PT Treatment/Interventions  Other (comment);Compression bandaging;Scar mobilization;Patient/family education;Manual techniques   wound debridment, pulse lavage   PT Next Visit Plan  continue with POC    Consulted and Agree with Plan of Care  Patient       Patient will benefit from skilled therapeutic intervention in order to improve the following deficits and impairments:  Abnormal gait, Decreased activity tolerance, Decreased balance, Decreased endurance, Decreased knowledge of precautions, Decreased mobility, Decreased range of motion, Difficulty walking, Pain, Other (comment), Increased edema, Impaired sensation, Decreased skin integrity(wound)  Visit Diagnosis: Difficulty in walking, not elsewhere classified  Open wound of right foot, initial encounter  Amputation of toe of right foot  [facility])    Problem List Patient Active Problem List   Diagnosis Date Noted  . Pre-ulcerative calluses 04/20/2018  . 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 Diabetic foot ulcer associated with type 2 diabetes mellitus ([facility]) 07/07/2014  . Tobacco abuse 07/07/2014  . Infestation, maggots 07/07/2014  . Plantar ulcer of right foot ([facility]) 05/12/2014  . Pulse weakness   . Peripheral neuropathy   . Cellulitis of right lower extremity 05/09/2014  . Obesity 05/09/2014  . Cellulitis of right lower leg 05/09/2014  . Hyperglycemia 03/21/2013  . IDDM (insulin dependent diabetes mellitus) 03/21/2013  . Noncompliance with medications due to cost issues 03/21/2013    3:52 PM, 01/21/19 Tereasa Coop, DPT Physical Therapy with [facility]  [phone] office  [facility] [address] [place], [place], [address] Phone: [phone]   Fax:  [phone]  Name: Mitchell [name] MRN: [id number] Date of Birth: 03-Dec-1950

## 2019-01-22 ENCOUNTER — Encounter (HOSPITAL_COMMUNITY): Payer: Self-pay

## 2019-01-22 ENCOUNTER — Ambulatory Visit (HOSPITAL_COMMUNITY): Payer: Medicare HMO

## 2019-01-22 DIAGNOSIS — S98131A Complete traumatic amputation of one right lesser toe, initial encounter: Secondary | ICD-10-CM

## 2019-01-22 DIAGNOSIS — R262 Difficulty in walking, not elsewhere classified: Secondary | ICD-10-CM

## 2019-01-22 DIAGNOSIS — S91301A Unspecified open wound, right foot, initial encounter: Secondary | ICD-10-CM

## 2019-01-22 NOTE — Therapy (Signed)
[facility] [address] [place], [place], [address] Phone: [phone]   Fax:  [phone]  Wound Care Therapy  Patient Details  Name: Mitchell [name] MRN: [id number] Date of Birth: 10-02-1950 Referring Provider (PT): Nicholes Rough   Encounter Date: 01/22/2019  PT End of Session - 01/22/19 1412    Visit Number  2    Number of Visits  16    Date for PT Re-Evaluation  03/18/19    Authorization Type  [employer]na Medicare    Authorization Time Period  POC dates 01/21/19 to 03/18/2019    Authorization - Visit Number  2    Authorization - Number of Visits  10    PT Start Time  1134    PT Stop Time  1223    PT Time Calculation (min)  49 min    Activity Tolerance  Patient tolerated treatment well;No increased pain    Behavior During Therapy  WFL for tasks assessed/performed       Past Medical History:  Diagnosis Date  . Arthritis   . Cellulitis of right lower extremity 05/09/2014  . Diabetes mellitus without complication ([facility])   . Diabetic foot ulcer ([facility]) 07/07/2014   Status post bedside debridement of multiple right plantar ulcers by podiatrist, Dr. Reynolds Bowl.  . Hypercholesterolemia   . Maggot infestation    right foot ulcer  . Obesity 05/09/2014  . Tobacco abuse 07/07/2014    Past Surgical History:  Procedure Laterality Date  . APPENDECTOMY    . PERIPHERAL VASCULAR CATHETERIZATION N/A 07/22/2015   Procedure: Abdominal Aortogram w/Lower Extremity;  Surgeon: Sherren Kerns, MD;  Location: [facility];  Service: Cardiovascular;  Laterality: N/A;    There were no vitals filed for this visit.   Subjective Assessment - 01/22/19 1304    Subjective  Pt arrived with dressings intact, reports increased discomfort/pain on medial aspect of foot feels related to wearingthe boot.    Currently in Pain?  Yes    Pain Score  8     Pain Location  Foot    Pain Orientation  Right;Distal    Pain Descriptors / Indicators  Discomfort    Pain Type  Acute  pain;Surgical pain    Aggravating Factors   boot on medial aspect                Wound Therapy - 01/22/19 1304    Subjective  Pt arrived with dressings intact, reports increased discomfort/pain on medial aspect of foot feels related to wearingthe boot.    Patient and Family Stated Goals  for incision/wound to heal    Date of Onset  12/15/18   DOS   Prior Treatments  betadine, wet to dry dressings, amputation of thrid toe on R foot    Pain Scale  0-10    Pain Onset  Unable to tell    Patients Stated Pain Goal  0    Pain Intervention(s)  Repositioned    Multiple Pain Sites  No    Evaluation and Treatment Procedures Explained to Patient/Family  Yes    Evaluation and Treatment Procedures  agreed to    Wound Properties Date First Assessed: 01/21/19 Time First Assessed: 0915 Wound Type: Diabetic ulcer;Incision - Dehisced Location: Foot Location Orientation: Right;Distal , at base of where third toe would be  Wound Description (Comments): incision site between second and forth toes on right foot Present on Admission: Yes   Dressing Type  Gauze (Comment);Compression wrap  medihoney, alginate, profore wiht extra cotton   Dressing Changed  Changed    Dressing Status  Old drainage    Dressing Change Frequency  PRN    Site / Wound Assessment  --   unable to see wound bed due to swelling of toes   % Wound base Red or Granulating  0%    % Wound base Other/Granulation Tissue (Comment)  100%   maceration and edema   Peri-wound Assessment  Maceration;Induration;Hemosiderin;Edema    Margins  Unattached edges (unapproximated)    Drainage Amount  Moderate    Drainage Description  Odor;Purulent    Treatment  Cleansed;Debridement (Selective)    Selective Debridement - Location  distal right foot between 2nd and 4th toes    Selective Debridement - Tools Used  Forceps;Scalpel;Scissors    Selective Debridement - Tissue Removed  devitalized tissue, dry flaky skin    Wound Therapy - Clinical  Statement  Increased drainage and odor present on dressings.  Added alginate to address draininage.  Selective debridement complete for removal of devitalized tissue and dead skin.  Added cotton around medial aspect of foot for pain control, believe the metal aspect of boot contact is reason for pain.  No reports of increased pain through session.      Wound Therapy - Functional Problem List  pain with walking, standing and sleeping    Factors Delaying/Impairing Wound Healing  Diabetes Mellitus;Vascular compromise;Multiple medical problems    Hydrotherapy Plan  Other (comment)   pulsative lavage with suction as needed   Wound Therapy - Frequency  2X / week    Wound Therapy - Current Recommendations  PT    Wound Plan  continue with toe wraps, debridement of  incision/wound, profore    Dressing   toe wraps, medihoney on gauze, alginate then Kerlix, then profore, vasoline around incision site to help with macerated tissue                 PT Short Term Goals - 01/21/19 1548      PT SHORT TERM GOAL #1   Title  PT wound to be 50% granulated    Time  4    Period  Weeks    Status  New    Target Date  02/18/19      PT SHORT TERM GOAL #2   Title  Patient will demonstrate at least 50% improvement in swelling in toes per visual inspection by PT    Time  4    Period  Weeks    Status  New    Target Date  02/18/19        PT Long Term Goals - 01/21/19 1550      PT LONG TERM GOAL #1   Title  Patient will report no more then 2/10 pain in his foot to improve QOL.    Time  8    Period  Weeks    Status  New    Target Date  03/18/19      PT LONG TERM GOAL #2   Title  Wound will be 100% granulated    Time  8    Period  Weeks    Status  New    Target Date  03/18/19              Patient will benefit from skilled therapeutic intervention in order to improve the following deficits and impairments:     Visit Diagnosis: Amputation of toe of right foot ([facility])  Difficulty  in  walking, not elsewhere classified  Open wound of right foot, initial encounter     Problem List Patient Active Problem List   Diagnosis Date Noted  . Pre-ulcerative calluses 04/20/2018  . 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 Diabetic foot ulcer associated with type 2 diabetes mellitus ([place]) 07/07/2014  . Tobacco abuse 07/07/2014  . Infestation, maggots 07/07/2014  . Plantar ulcer of right foot ([place]) 05/12/2014  . Pulse weakness   . Peripheral neuropathy   . Cellulitis of right lower extremity 05/09/2014  . Obesity 05/09/2014  . Cellulitis of right lower leg 05/09/2014  . Hyperglycemia 03/21/2013  . IDDM (insulin dependent diabetes mellitus) 03/21/2013  . Noncompliance with medications due to cost issues 03/21/2013   Ihor Austin, [place]; [place]  Aldona Lento 01/22/2019, 2:13 PM  [place], [place], [address] Phone: [phone]   Fax:  [phone]  Name: Mitchell [name] MRN: [id number] Date of Birth: [birth year]

## 2019-01-27 ENCOUNTER — Other Ambulatory Visit: Payer: Self-pay

## 2019-01-27 ENCOUNTER — Ambulatory Visit (HOSPITAL_COMMUNITY): Payer: Medicare HMO | Admitting: Physical Therapy

## 2019-01-27 ENCOUNTER — Encounter (HOSPITAL_COMMUNITY): Payer: Self-pay | Admitting: Physical Therapy

## 2019-01-27 DIAGNOSIS — S91301A Unspecified open wound, right foot, initial encounter: Secondary | ICD-10-CM

## 2019-01-27 DIAGNOSIS — S98131A Complete traumatic amputation of one right lesser toe, initial encounter: Secondary | ICD-10-CM

## 2019-01-27 DIAGNOSIS — R262 Difficulty in walking, not elsewhere classified: Secondary | ICD-10-CM

## 2019-01-27 NOTE — Therapy (Signed)
[place], [place], [address] Phone: [phone]   Fax:  [phone]  Wound Care Therapy  Patient Details  Name: Mitchell [name] MRN: [id number] Date of Birth: 1950/08/08 Referring Provider (PT): [name]   Encounter Date: 01/27/2019  PT End of Session - 01/27/19 1608    Visit Number  3    Number of Visits  16    Date for PT Re-Evaluation  03/18/19    Authorization Type  [employer] Medicare    Authorization Time Period  POC dates 01/21/19 to 03/18/2019    Authorization - Visit Number  3    Authorization - Number of Visits  10    PT Start Time  1000    PT Stop Time  1050    PT Time Calculation (min)  50 min    Activity Tolerance  Patient tolerated treatment well;No increased pain    Behavior During Therapy  WFL for tasks assessed/performed       Past Medical History:  Diagnosis Date  . Arthritis   . Cellulitis of right lower extremity 05/09/2014  . Diabetes mellitus without complication ([place])   . Diabetic foot ulcer ([place]) 07/07/2014   Status post bedside debridement of multiple right plantar ulcers by podiatrist, Dr. Laurena Spies.  . Hypercholesterolemia   . Maggot infestation    right foot ulcer  . Obesity 05/09/2014  . Tobacco abuse 07/07/2014    Past Surgical History:  Procedure Laterality Date  . APPENDECTOMY    . PERIPHERAL VASCULAR CATHETERIZATION N/A 07/22/2015   Procedure: Abdominal Aortogram w/Lower Extremity;  Surgeon: Elam Dutch, MD;  Location: [facility];  Service: Cardiovascular;  Laterality: N/A;    There were no vitals filed for this visit.     [facility] PT Assessment - 01/27/19 0001      Assessment   Medical Diagnosis  diabetic ulcer of toe of right foot     Referring Provider (PT)  [name]    Onset Date/Surgical Date  12/15/18    Next MD Visit  01/28/19              Wound Therapy - 01/27/19 1609    Subjective  Patient arrived with bandages soaked through with drainage and odor  coming from dressing. Patient reports he has had none stop drainage from his foot and it soaked to his walking boot. States he hates the walking boot and really wants to be done with it. States he follows up with the MD tomorrow and thinks the MD will want to take his whole foot.    Patient and Family Stated Goals  for incision/wound to heal    Date of Onset  12/15/18    Prior Treatments  betadine, wet to dry dressings, amputation of thrid toe on R foot    Pain Scale  0-10    Pain Score  0-No pain    Evaluation and Treatment Procedures Explained to Patient/Family  Yes    Evaluation and Treatment Procedures  agreed to    Wound Properties Date First Assessed: 01/21/19 Time First Assessed: 0915 Wound Type: Diabetic ulcer;Incision - Dehisced Location: Foot Location Orientation: Right;Distal , at base of where third toe would be  Wound Description (Comments): incision site between second and forth toes on right foot Present on Admission: Yes   Dressing Type  Silver hydrofiber;Compression wrap   profore lite with extra cotton, toe wraps   Dressing Changed  Changed    Dressing Status  Old drainage    Dressing Change Frequency  PRN    Site / Wound Assessment  Other (Comment)    % Wound base Red or Granulating  0%    % Wound base Other/Granulation Tissue (Comment)  100%    Peri-wound Assessment  Maceration;Induration;Hemosiderin;Edema    Margins  Unattached edges (unapproximated)    Drainage Amount  Moderate    Drainage Description  Odor;Purulent    Treatment  Cleansed;Debridement (Selective)    Wound Therapy - Clinical Statement  Increased drainage and odor noted on this date. Improved swelling noted throughout lower legs but swelling still present and still present in toes. Maceration of skin noted along bottom of foot, between toes, and incision site. Changed bandage and changed dressing to silver hydrofiber and put between toes to help absorb drainage. Changed to profore lite secondary to increased  thickness of wrappings at foot and patient needing to fit into CAM boot. Small friction irritation areas noted along top of foot and anterior shin likely due to decrease in swelling with compression wrapping and then subsequent irritation due to looseness of wrapping. Patient to see MD tomorrow, will follow up with patient about MD appointment next session.     Wound Therapy - Functional Problem List  pain with walking, standing and sleeping    Factors Delaying/Impairing Wound Healing  Diabetes Mellitus;Vascular compromise;Multiple medical problems    Hydrotherapy Plan  Other (comment)   as needed pulsaltie lavage   Wound Therapy - Frequency  2X / week    Wound Therapy - Current Recommendations  PT    Wound Plan  continue with toe wraps, debridement of  incision/wound, profore    Dressing   toe wraps, silver hydrofiver between toes and at incision site, kerlix and profore lite                PT Short Term Goals - 01/21/19 1548      PT SHORT TERM GOAL #1   Title  PT wound to be 50% granulated    Time  4    Period  Weeks    Status  New    Target Date  02/18/19      PT SHORT TERM GOAL #2   Title  Patient will demonstrate at least 50% improvement in swelling in toes per visual inspection by PT    Time  4    Period  Weeks    Status  New    Target Date  02/18/19        PT Long Term Goals - 01/21/19 1550      PT LONG TERM GOAL #1   Title  Patient will report no more then 2/10 pain in his foot to improve QOL.    Time  8    Period  Weeks    Status  New    Target Date  03/18/19      PT LONG TERM GOAL #2   Title  Wound will be 100% granulated    Time  8    Period  Weeks    Status  New    Target Date  03/18/19            Plan - 01/27/19 1608    Clinical Impression Statement  see above    Personal Factors and Comorbidities  Age;Comorbidity 1;Comorbidity 2;Fitness    Examination-Activity Limitations  Bathing;Dressing;Hygiene/Grooming;Locomotion  Level;Stand;Stairs;Squat;Sleep;Transfers    Examination-Participation Restrictions  Cleaning;Driving;Community Activity    Stability/Clinical Decision Making  Evolving/Moderate complexity  Rehab Potential  Good    PT Frequency  2x / week    PT Duration  8 weeks    PT Treatment/Interventions  Other (comment);Compression bandaging;Scar mobilization;Patient/family education;Manual techniques   wound debridment, pulse lavage   PT Next Visit Plan  continue with POC    Consulted and Agree with Plan of Care  Patient       Patient will benefit from skilled therapeutic intervention in order to improve the following deficits and impairments:  Abnormal gait, Decreased activity tolerance, Decreased balance, Decreased endurance, Decreased knowledge of precautions, Decreased mobility, Decreased range of motion, Difficulty walking, Pain, Other (comment), Increased edema, Impaired sensation, Decreased skin integrity(wound)  Visit Diagnosis: Amputation of toe of right foot (HCC)  Difficulty in walking, not elsewhere classified  Open wound of right foot, initial encounter     Problem List Patient Active Problem List   Diagnosis Date Noted  . Pre-ulcerative calluses 04/20/2018  . 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 Diabetic foot ulcer associated with type 2 diabetes mellitus (HCC) 07/07/2014  . Tobacco abuse 07/07/2014  . Infestation, maggots 07/07/2014  . Plantar ulcer of right foot (HCC) 05/12/2014  . Pulse weakness   . Peripheral neuropathy   . Cellulitis of right lower extremity 05/09/2014  . Obesity 05/09/2014  . Cellulitis of right lower leg 05/09/2014  . Hyperglycemia 03/21/2013  . IDDM (insulin dependent diabetes mellitus) 03/21/2013  . Noncompliance with medications due to cost issues 03/21/2013   4:13 PM, 01/27/19 Tereasa Coop, DPT Physical Therapy with [facility]  [phone] office  [facility] [address]  [place], [place], [address] Phone: [phone]   Fax:  [phone]  Name: Mitchell [name] MRN: [id number] Date of Birth: 04-Jun-1950

## 2019-01-28 ENCOUNTER — Other Ambulatory Visit: Payer: Self-pay

## 2019-01-28 ENCOUNTER — Encounter: Payer: Self-pay | Admitting: Podiatry

## 2019-01-28 ENCOUNTER — Ambulatory Visit (INDEPENDENT_AMBULATORY_CARE_PROVIDER_SITE_OTHER): Payer: Medicare HMO | Admitting: Podiatry

## 2019-01-28 DIAGNOSIS — M869 Osteomyelitis, unspecified: Secondary | ICD-10-CM

## 2019-01-28 DIAGNOSIS — S98131A Complete traumatic amputation of one right lesser toe, initial encounter: Secondary | ICD-10-CM

## 2019-01-28 DIAGNOSIS — Z9889 Other specified postprocedural states: Secondary | ICD-10-CM

## 2019-01-28 NOTE — Progress Notes (Signed)
Subjective:  Patient ID: Mitchell [name], male    DOB: 10-19-1950,  MRN: [id number]  Chief Complaint  Patient presents with  . Post-op Follow-up    POV #4 DOS 12/15/2018 AMPUTATION TOE INTERPHALANGEAL 3RD RT. Pt stated, "My toes don't hurt. [place] MDs are using MediHoney and silver cream, so it's hard to tell what the color of the drainage is. Area is not hot to the touch. The boot is killing me. The [facility] MDs told me to discuss coming out of the boot. It's causing the top of my foot to bleed, and I have open skin (small area) on my leg. [facility] team covered the areas and wrapped my leg".    70 y.o. male returns for post-op check.  Agree with above  Review of Systems: Negative except as noted in the HPI. Denies N/V/F/Ch.  Past Medical History:  Diagnosis Date  . Arthritis   . Cellulitis of right lower extremity 05/09/2014  . Diabetes mellitus without complication ([place])   . Diabetic foot ulcer ([place]) 07/07/2014   Status post bedside debridement of multiple right plantar ulcers by podiatrist, Dr. Laurena Spies.  . Hypercholesterolemia   . Maggot infestation    right foot ulcer  . Obesity 05/09/2014  . Tobacco abuse 07/07/2014    Current Outpatient Medications:  .  aspirin EC 81 MG tablet, Take 81 mg by mouth daily., Disp: , Rfl:  .  canagliflozin (INVOKANA) 300 MG TABS tablet, Take 300 mg by mouth daily before breakfast., Disp: , Rfl:  .  doxycycline (VIBRA-TABS) 100 MG tablet, Take 1 tablet (100 mg total) by mouth 2 (two) times daily., Disp: 28 tablet, Rfl: 0 .  furosemide (LASIX) 20 MG tablet, Take 1 tablet (20 mg total) by mouth daily., Disp: 30 tablet, Rfl: 0 .  gabapentin (NEURONTIN) 300 MG capsule, Take 1 capsule (300 mg total) by mouth 3 (three) times daily., Disp: 90 capsule, Rfl: 0 .  ibuprofen (ADVIL) 800 MG tablet, Take 1 tablet (800 mg total) by mouth every 6 (six) hours as needed., Disp: 60 tablet, Rfl: 1 .  JARDIANCE 25 MG TABS tablet, , Disp: , Rfl:  .  lovastatin (MEVACOR) 20 MG  tablet, Take 1 tablet (20 mg total) by mouth at bedtime., Disp: 30 tablet, Rfl: 0 .  metFORMIN (GLUCOPHAGE) 500 MG tablet, Take 1,000 mg by mouth 2 (two) times daily., Disp: , Rfl:  .  NOVOLOG FLEXPEN 100 UNIT/ML FlexPen, INJECT 15 UNITS SUBCUTANEOUSLY ONCE DAILY WITH BREAKFAST 30 UNITS WITH LUNCH AND 30 UNITS WITH DINNER. MAXIMUM DOSE OF 100 UNITS PER DAY, Disp: , Rfl:  .  sulfamethoxazole-trimethoprim (BACTRIM DS) 800-160 MG tablet, Take 1 tablet by mouth 2 (two) times daily., Disp: , Rfl:  .  TRESIBA FLEXTOUCH 200 UNIT/ML SOPN, Inject 55 Units into the skin daily., Disp: , Rfl:  .  triamcinolone cream (KENALOG) 0.1 %, APPLY CREAM EXTERNALLY ONCE DAILY AS DIRECTED, Disp: , Rfl:   Social History   Tobacco Use  Smoking Status Current Every Day Smoker  . Packs/day: 1.50  . Types: Cigarettes  Smokeless Tobacco Never Used    Allergies  Allergen Reactions  . Penicillins Itching and Rash    Has patient had a PCN reaction causing immediate rash, facial/tongue/throat swelling, SOB or lightheadedness with hypotension: Yes Has patient had a PCN reaction causing severe rash involving mucus membranes or skin necrosis: No Has patient had a PCN reaction that required hospitalization No Has patient had a PCN reaction occurring within the last 10 years:  No If all of the above answers are "NO", then may proceed with Cephalosporin use.    Objective:  There were no vitals filed for this visit. There is no height or weight on file to calculate BMI. Constitutional Well developed. Well nourished.  Vascular Foot warm and well perfused. Capillary refill normal to all digits.   Neurologic Normal speech. Oriented to person, place, and time. Epicritic sensation to light touch grossly present bilaterally.  Dermatologic  mild dehiscence noted with central probe down to deep tissue.  There is still maceration presents to the surrounding skin.  Patient is extremely fluid overloaded and is causing excessive  amount of fluid to be expelled from the body into the surrounding tissue.  No other clinical signs of infection noted.  Orthopedic: Tenderness to palpation noted about the surgical site.   Radiographs:None  Assessment:   1. Osteomyelitis of right foot, unspecified type (HCC)   2. Status post foot surgery   3. Amputation of toe of right foot (HCC)    Plan:  Patient was evaluated and treated and all questions answered.  S/p foot surgery right -Progressing as expected post-operatively. -XR: See above -WB Status: Weightbearing as tolerated in cam boot -Sutures: Removed.  Deep closure noted however superficial skin is very macerated and not completely closed. 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  I explained to the patient if this comes back as positive for infection he will end up losing all of his toes and I will need to perform a transmetatarsal amputation given that he has a history of a partial fifth metatarsal resection.  Patient agrees with this plan and will obtain an MRI as soon as possible. -MRI is scheduled for January 29.  Patient is amenable to getting the MRI on the 29th. -It appears the patient's wound care is being managed by [facility] wound care and has been doing splendid job.  I I believe patient will benefit from management of the wound by reasonable wound care for now.  I will see the patient back in 3 weeks given that the wound is being managed.  I told the patient that if there is any signs of infection of the wound is regressing, see me sooner. -He has a incision wound measuring 1 cm x 0.5 cm with surrounding macerated tissue.  Aggressive Betadine wet-to-dry dressing  was applied to the incision site.  Return in about 3 weeks (around 02/18/2019) for Foot Ulcer.

## 2019-01-29 ENCOUNTER — Ambulatory Visit (HOSPITAL_COMMUNITY): Payer: Medicare HMO | Admitting: Physical Therapy

## 2019-01-29 DIAGNOSIS — R262 Difficulty in walking, not elsewhere classified: Secondary | ICD-10-CM

## 2019-01-29 DIAGNOSIS — S91301A Unspecified open wound, right foot, initial encounter: Secondary | ICD-10-CM

## 2019-01-29 DIAGNOSIS — S98131A Complete traumatic amputation of one right lesser toe, initial encounter: Secondary | ICD-10-CM

## 2019-01-29 NOTE — Therapy (Signed)
[facility] [address] [place], [place], [address] Phone: [phone]   Fax:  [phone]  Wound Care Therapy  Patient Details  Name: Mitchell [name] MRN: [id number] Date of Birth: 12-Sep-1950 Referring Provider (PT): Nicholes Rough   Encounter Date: 01/29/2019  PT End of Session - 01/29/19 1632    Visit Number  4    Number of Visits  16    Date for PT Re-Evaluation  03/18/19    Authorization Type  Aetna Medicare    Authorization Time Period  POC dates 01/21/19 to 03/18/2019    Authorization - Visit Number  4    Authorization - Number of Visits  10    PT Start Time  1005    PT Stop Time  1040    PT Time Calculation (min)  35 min    Activity Tolerance  Patient tolerated treatment well;No increased pain    Behavior During Therapy  WFL for tasks assessed/performed       Past Medical History:  Diagnosis Date  . Arthritis   . Cellulitis of right lower extremity 05/09/2014  . Diabetes mellitus without complication (HCC)   . Diabetic foot ulcer (HCC) 07/07/2014   Status post bedside debridement of multiple right plantar ulcers by podiatrist, Dr. Reynolds Bowl.  . Hypercholesterolemia   . Maggot infestation    right foot ulcer  . Obesity 05/09/2014  . Tobacco abuse 07/07/2014    Past Surgical History:  Procedure Laterality Date  . APPENDECTOMY    . PERIPHERAL VASCULAR CATHETERIZATION N/A 07/22/2015   Procedure: Abdominal Aortogram w/Lower Extremity;  Surgeon: Sherren Kerns, MD;  Location: [facility];  Service: Cardiovascular;  Laterality: N/A;    There were no vitals filed for this visit.              Wound Therapy - 01/29/19 1530    Subjective  pt states the MD was pleased with the woundcare he's been receiving here.  STates he is to have an MRI to check further bone involvement and follow up again wiith pateint next week.     Patient and Family Stated Goals  for incision/wound to heal    Date of Onset  12/15/18    Prior  Treatments  betadine, wet to dry dressings, amputation of thrid toe on R foot    Pain Scale  0-10    Pain Score  0-No pain    Evaluation and Treatment Procedures Explained to Patient/Family  Yes    Evaluation and Treatment Procedures  agreed to    Wound Properties Date First Assessed: 01/21/19 Time First Assessed: 0915 Wound Type: Diabetic ulcer;Incision - Dehisced Location: Foot Location Orientation: Right;Distal , at base of where third toe would be  Wound Description (Comments): incision site between second and forth toes on right foot Present on Admission: Yes   Dressing Type  Silver hydrofiber;Compression wrap   profore lite with extra cotton, toe wraps   Dressing Changed  Changed    Dressing Status  Old drainage    Dressing Change Frequency  PRN    Site / Wound Assessment  Other (Comment)    % Wound base Red or Granulating  5%    % Wound base Yellow/Fibrinous Exudate  95%    % Wound base Other/Granulation Tissue (Comment)  100%    Peri-wound Assessment  Maceration;Induration;Hemosiderin;Edema    Margins  Unattached edges (unapproximated)    Drainage Amount  Moderate    Drainage Description  Odor;Purulent  Treatment  Cleansed;Debridement (Selective)    Selective Debridement - Location  distal right foot between 2nd and 4th toes    Selective Debridement - Tools Used  Forceps;Scalpel;Scissors    Selective Debridement - Tissue Removed  devitalized tissue, dry flaky skin    Wound Therapy - Clinical Statement  wound continues to drain with odor and maceration present.  debrided borders and maceration perimeter of wound.  Packed silver hydro strip into wound and stacked several layers on top as well as gauze and ABD pad to help absorb drainage.  Pt reported overall comfort  Now using a walking shoe rather than the boot.  Also noted a small 0.3cm X 0.3cm X 0.2 in depth near the base of the 4th metatarsal that was open with a little drainage.  Will need to follow this.       Wound Therapy -  Functional Problem List  pain with walking, standing and sleeping    Factors Delaying/Impairing Wound Healing  Diabetes Mellitus;Vascular compromise;Multiple medical problems    Hydrotherapy Plan  Other (comment)   as needed pulsaltie lavage   Wound Therapy - Frequency  2X / week    Wound Therapy - Current Recommendations  PT    Wound Plan  continue with appropriate dressing, debridement of  incision/wound, profore. Measure the wound    Dressing   silver hydrofiber between toes and at incision site, kerlix and #5 netting                PT Short Term Goals - 01/21/19 1548      PT SHORT TERM GOAL #1   Title  PT wound to be 50% granulated    Time  4    Period  Weeks    Status  New    Target Date  02/18/19      PT SHORT TERM GOAL #2   Title  Patient will demonstrate at least 50% improvement in swelling in toes per visual inspection by PT    Time  4    Period  Weeks    Status  New    Target Date  02/18/19        PT Long Term Goals - 01/21/19 1550      PT LONG TERM GOAL #1   Title  Patient will report no more then 2/10 pain in his foot to improve QOL.    Time  8    Period  Weeks    Status  New    Target Date  03/18/19      PT LONG TERM GOAL #2   Title  Wound will be 100% granulated    Time  8    Period  Weeks    Status  New    Target Date  03/18/19            Plan - 01/29/19 1633    Personal Factors and Comorbidities  Age;Comorbidity 1;Comorbidity 2;Fitness    Examination-Activity Limitations  Bathing;Dressing;Hygiene/Grooming;Locomotion Level;Stand;Stairs;Squat;Sleep;Transfers    Examination-Participation Restrictions  Cleaning;Driving;Community Activity    Stability/Clinical Decision Making  Evolving/Moderate complexity    Rehab Potential  Good    PT Frequency  2x / week    PT Duration  8 weeks    PT Treatment/Interventions  Other (comment);Compression bandaging;Scar mobilization;Patient/family education;Manual techniques   wound debridment, pulse  lavage   PT Next Visit Plan  continue with POC    Consulted and Agree with Plan of Care  Patient       Patient  will benefit from skilled therapeutic intervention in order to improve the following deficits and impairments:  Abnormal gait, Decreased activity tolerance, Decreased balance, Decreased endurance, Decreased knowledge of precautions, Decreased mobility, Decreased range of motion, Difficulty walking, Pain, Other (comment), Increased edema, Impaired sensation, Decreased skin integrity(wound)  Visit Diagnosis: Amputation of toe of right foot (HCC)  Difficulty in walking, not elsewhere classified  Open wound of right foot, initial encounter     Problem List Patient Active Problem List   Diagnosis Date Noted  . Pre-ulcerative calluses 04/20/2018  . 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 Diabetic foot ulcer associated with type 2 diabetes mellitus (HCC) 07/07/2014  . Tobacco abuse 07/07/2014  . Infestation, maggots 07/07/2014  . Plantar ulcer of right foot (HCC) 05/12/2014  . Pulse weakness   . Peripheral neuropathy   . Cellulitis of right lower extremity 05/09/2014  . Obesity 05/09/2014  . Cellulitis of right lower leg 05/09/2014  . Hyperglycemia 03/21/2013  . IDDM (insulin dependent diabetes mellitus) 03/21/2013  . Noncompliance with medications due to cost issues 03/21/2013   Lurena Nida, PTA/CLT [phone]  Lurena Nida 01/29/2019, 4:40 PM  [facility] [address] [place], [place], [address] Phone: [phone]   Fax:  [phone]  Name: Brenner Visconti MRN: [id number] Date of Birth: Dec 08, 1950

## 2019-01-30 ENCOUNTER — Encounter (HOSPITAL_COMMUNITY): Payer: Self-pay

## 2019-01-30 ENCOUNTER — Ambulatory Visit (HOSPITAL_COMMUNITY): Payer: Medicare HMO

## 2019-01-30 ENCOUNTER — Other Ambulatory Visit: Payer: Self-pay

## 2019-01-30 DIAGNOSIS — E7800 Pure hypercholesterolemia, unspecified: Secondary | ICD-10-CM | POA: Diagnosis not present

## 2019-01-30 DIAGNOSIS — R262 Difficulty in walking, not elsewhere classified: Secondary | ICD-10-CM

## 2019-01-30 DIAGNOSIS — I10 Essential (primary) hypertension: Secondary | ICD-10-CM | POA: Diagnosis not present

## 2019-01-30 DIAGNOSIS — Z0100 Encounter for examination of eyes and vision without abnormal findings: Secondary | ICD-10-CM | POA: Diagnosis not present

## 2019-01-30 DIAGNOSIS — S98131A Complete traumatic amputation of one right lesser toe, initial encounter: Secondary | ICD-10-CM

## 2019-01-30 DIAGNOSIS — E13311 Other specified diabetes mellitus with unspecified diabetic retinopathy with macular edema: Secondary | ICD-10-CM | POA: Diagnosis not present

## 2019-01-30 DIAGNOSIS — S91301A Unspecified open wound, right foot, initial encounter: Secondary | ICD-10-CM

## 2019-01-30 DIAGNOSIS — H524 Presbyopia: Secondary | ICD-10-CM | POA: Diagnosis not present

## 2019-01-30 NOTE — Therapy (Signed)
[place], [place], [address] Phone: [phone]   Fax:  [phone]  Wound Care Therapy  Patient Details  Name: Mitchell [name] MRN: [id number] Date of Birth: January 14, 1951 Referring Provider (PT): [name]   Encounter Date: 01/30/2019  PT End of Session - 01/30/19 0916    Visit Number  5    Number of Visits  16    Date for PT Re-Evaluation  03/18/19    Authorization Type  [employer] Medicare    Authorization Time Period  POC dates 01/21/19 to 03/18/2019    Authorization - Visit Number  5    Authorization - Number of Visits  10    PT Start Time  0825    PT Stop Time  0903    PT Time Calculation (min)  38 min    Activity Tolerance  Patient tolerated treatment well;No increased pain    Behavior During Therapy  WFL for tasks assessed/performed       Past Medical History:  Diagnosis Date  . Arthritis   . Cellulitis of right lower extremity 05/09/2014  . Diabetes mellitus without complication ([place])   . Diabetic foot ulcer ([place]) 07/07/2014   Status post bedside debridement of multiple right plantar ulcers by podiatrist, Dr. Laurena Spies.  . Hypercholesterolemia   . Maggot infestation    right foot ulcer  . Obesity 05/09/2014  . Tobacco abuse 07/07/2014    Past Surgical History:  Procedure Laterality Date  . APPENDECTOMY    . PERIPHERAL VASCULAR CATHETERIZATION N/A 07/22/2015   Procedure: Abdominal Aortogram w/Lower Extremity;  Surgeon: Elam Dutch, MD;  Location: [facility];  Service: Cardiovascular;  Laterality: N/A;    There were no vitals filed for this visit.   Subjective Assessment - 01/30/19 0910    Subjective  Pt arrived wiht dressings intact, reports very pleased with treatments and feels his toe is looking better.  Reports a new shoe from MD, stated boot irritated dorsal aspect of foot.  No pain today                Wound Therapy - 01/30/19 0911    Subjective  Pt arrived wiht dressings intact,  reports very pleased with treatments and feels his toe is looking better.  Reports a new shoe from MD, stated boot irritated dorsal aspect of foot.  No pain today    Patient and Family Stated Goals  for incision/wound to heal    Date of Onset  12/15/18    Prior Treatments  betadine, wet to dry dressings, amputation of thrid toe on R foot    Pain Scale  0-10    Pain Score  0-No pain    Evaluation and Treatment Procedures Explained to Patient/Family  Yes    Evaluation and Treatment Procedures  agreed to    Wound Properties Date First Assessed: 01/21/19 Time First Assessed: 0915 Wound Type: Diabetic ulcer;Incision - Dehisced Location: Foot Location Orientation: Right;Distal , at base of where third toe would be  Wound Description (Comments): incision site between second and forth toes on right foot Present on Admission: Yes   Dressing Type  Silver hydrofiber;Compression wrap;Abdominal pads   silverhydrofiber, ABD, profore   Dressing Changed  Changed    Dressing Status  Old drainage    Dressing Change Frequency  PRN    % Wound base Red or Granulating  5%    % Wound base Yellow/Fibrinous Exudate  95%    Peri-wound Assessment  Maceration;Induration;Hemosiderin;Edema    Wound Length (cm)  3 cm    Wound Width (cm)  0.4 cm    Wound Depth (cm)  1 cm    Wound Volume (cm^3)  1.2 cm^3    Wound Surface Area (cm^2)  1.2 cm^2    Undermining (cm)  unable to determine    Margins  Unattached edges (unapproximated)    Drainage Amount  Moderate    Drainage Description  Odor;Purulent    Treatment  Cleansed;Debridement (Selective)    Selective Debridement - Location  distal right foot between 2nd and 4th toes    Selective Debridement - Tools Used  Forceps;Scalpel;Scissors    Selective Debridement - Tissue Removed  devitalized tissue, dry flaky skin    Wound Therapy - Clinical Statement  Wound continues to drain wiht odor and maceration present proximal tissues.  Selective debridement for removal of  devitalized tissue and slough.  Pt reports vast improvements in comfort wiht new shoe, no selective debridement complete this session to new spots near 4th metatarsal on dorsal aspect.  Continued with extra gauze and ABD pad to address drainage and profore for edema control.  No reports of pain through session.    Wound Therapy - Functional Problem List  pain with walking, standing and sleeping    Factors Delaying/Impairing Wound Healing  Diabetes Mellitus;Vascular compromise;Multiple medical problems    Hydrotherapy Plan  Other (comment)   as need pulsed lavage   Wound Therapy - Frequency  2X / week    Wound Therapy - Current Recommendations  PT    Wound Plan  continue with appropriate dressing, debridement of  incision/wound, profore. Measure the wound    Dressing   silver hydrofiber between toes and at incision site, ABD pad, profore and netting #5                PT Short Term Goals - 01/21/19 1548      PT SHORT TERM GOAL #1   Title  PT wound to be 50% granulated    Time  4    Period  Weeks    Status  New    Target Date  02/18/19      PT SHORT TERM GOAL #2   Title  Patient will demonstrate at least 50% improvement in swelling in toes per visual inspection by PT    Time  4    Period  Weeks    Status  New    Target Date  02/18/19        PT Long Term Goals - 01/21/19 1550      PT LONG TERM GOAL #1   Title  Patient will report no more then 2/10 pain in his foot to improve QOL.    Time  8    Period  Weeks    Status  New    Target Date  03/18/19      PT LONG TERM GOAL #2   Title  Wound will be 100% granulated    Time  8    Period  Weeks    Status  New    Target Date  03/18/19              Patient will benefit from skilled therapeutic intervention in order to improve the following deficits and impairments:     Visit Diagnosis: Amputation of toe of right foot (HCC)  Difficulty in walking, not elsewhere classified  Open wound of right foot, initial  encounter  Problem List Patient Active Problem List   Diagnosis Date Noted  . Pre-ulcerative calluses 04/20/2018  . 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 Diabetic foot ulcer associated with type 2 diabetes mellitus (HCC) 07/07/2014  . Tobacco abuse 07/07/2014  . Infestation, maggots 07/07/2014  . Plantar ulcer of right foot (HCC) 05/12/2014  . Pulse weakness   . Peripheral neuropathy   . Cellulitis of right lower extremity 05/09/2014  . Obesity 05/09/2014  . Cellulitis of right lower leg 05/09/2014  . Hyperglycemia 03/21/2013  . IDDM (insulin dependent diabetes mellitus) 03/21/2013  . Noncompliance with medications due to cost issues 03/21/2013   Becky Sax, LPTA; CBIS [phone]  Juel Burrow 01/30/2019, 2:11 PM  [facility] [address] [place], [place], [address] Phone: [phone]   Fax:  [phone]  Name: Lee Kuang MRN: [id number] Date of Birth: 06-01-1950

## 2019-02-02 ENCOUNTER — Other Ambulatory Visit: Payer: Self-pay

## 2019-02-02 ENCOUNTER — Ambulatory Visit (HOSPITAL_COMMUNITY): Payer: Medicare HMO | Admitting: Physical Therapy

## 2019-02-02 DIAGNOSIS — S98131A Complete traumatic amputation of one right lesser toe, initial encounter: Secondary | ICD-10-CM

## 2019-02-02 DIAGNOSIS — R262 Difficulty in walking, not elsewhere classified: Secondary | ICD-10-CM

## 2019-02-02 DIAGNOSIS — S91301A Unspecified open wound, right foot, initial encounter: Secondary | ICD-10-CM

## 2019-02-02 NOTE — Therapy (Signed)
[facility] [address] [place], [place], [address] Phone: [phone]   Fax:  [phone]  Wound Care Therapy  Patient Details  Name: Mitchell [name] MRN: [id number] Date of Birth: Sep 06, 1950 Referring Provider (PT): Nicholes Rough   Encounter Date: 02/02/2019  PT End of Session - 02/02/19 1147    Visit Number  6    Number of Visits  16    Date for PT Re-Evaluation  03/18/19    Authorization Type  [employer] Medicare    Authorization Time Period  POC dates 01/21/19 to 03/18/2019    Authorization - Visit Number  6    Authorization - Number of Visits  10    PT Start Time  0916    PT Stop Time  0945    PT Time Calculation (min)  29 min    Activity Tolerance  Patient tolerated treatment well;No increased pain    Behavior During Therapy  WFL for tasks assessed/performed       Past Medical History:  Diagnosis Date  . Arthritis   . Cellulitis of right lower extremity 05/09/2014  . Diabetes mellitus without complication (HCC)   . Diabetic foot ulcer (HCC) 07/07/2014   Status post bedside debridement of multiple right plantar ulcers by podiatrist, Dr. Reynolds Bowl.  . Hypercholesterolemia   . Maggot infestation    right foot ulcer  . Obesity 05/09/2014  . Tobacco abuse 07/07/2014    Past Surgical History:  Procedure Laterality Date  . APPENDECTOMY    . PERIPHERAL VASCULAR CATHETERIZATION N/A 07/22/2015   Procedure: Abdominal Aortogram w/Lower Extremity;  Surgeon: Sherren Kerns, MD;  Location: [facility];  Service: Cardiovascular;  Laterality: N/A;    There were no vitals filed for this visit.              Wound Therapy - 02/02/19 1139    Subjective  Pt arrived wiht dressings intact, reports very pleased with treatments and feels his toe is looking better.  Reports a new shoe from MD, stated boot irritated dorsal aspect of foot.  No pain today    Patient and Family Stated Goals  for incision/wound to heal    Date of Onset  12/15/18    Prior Treatments  betadine, wet to dry dressings, amputation of thrid toe on R foot    Pain Scale  0-10    Pain Score  0-No pain    Evaluation and Treatment Procedures Explained to Patient/Family  Yes    Evaluation and Treatment Procedures  agreed to    Wound Properties Date First Assessed: 01/21/19 Time First Assessed: 0915 Wound Type: Diabetic ulcer;Incision - Dehisced Location: Foot Location Orientation: Right;Distal , at base of where third toe would be  Wound Description (Comments): incision site between second and forth toes on right foot Present on Admission: Yes   Dressing Type  Silver hydrofiber;Compression wrap;Abdominal pads   silverhydrofiber, ABD, profore   Dressing Changed  Changed    Dressing Status  Old drainage    Dressing Change Frequency  PRN    % Wound base Red or Granulating  5%    % Wound base Yellow/Fibrinous Exudate  95%    Peri-wound Assessment  Maceration;Induration;Hemosiderin;Edema    Margins  Unattached edges (unapproximated)    Drainage Amount  Moderate    Drainage Description  Odor;Purulent;Serosanguineous    Treatment  Cleansed;Debridement (Selective)    Selective Debridement - Location  distal right foot between 2nd and 4th toes  Selective Debridement - Tools Used  Forceps;Scalpel;Scissors    Selective Debridement - Tissue Removed  devitalized tissue, dry flaky skin    Wound Therapy - Clinical Statement  noted serosanginious/purlulent dressing exuded outside of wound area onto shoe.  diuscussed with pateint importance of keeping shoe clean, to do this each night when he removes it.  Pt is having problmems with insole sliding out of shoe and instructed to use doubleside tape to fix this.  Pt verbalized understanding to both.  wound continues to drain and small areas on dorsal aspect of foot remain as well with some drainage from these sites.  PT is to get his MRI next week and follows up with MD afterward.  Cotinued to use silver hydrofiber    Wound Therapy -  Functional Problem List  pain with walking, standing and sleeping    Factors Delaying/Impairing Wound Healing  Diabetes Mellitus;Vascular compromise;Multiple medical problems    Hydrotherapy Plan  Other (comment)   as need pulsed lavage   Wound Therapy - Frequency  2X / week    Wound Therapy - Current Recommendations  PT    Wound Plan  continue with appropriate dressing, debridement of  incision/wound, profore. Measure the wound    Dressing   silver hydrofiber between toes and at incision site, ABD pad, profore and netting #5                PT Short Term Goals - 01/21/19 1548      PT SHORT TERM GOAL #1   Title  PT wound to be 50% granulated    Time  4    Period  Weeks    Status  New    Target Date  02/18/19      PT SHORT TERM GOAL #2   Title  Patient will demonstrate at least 50% improvement in swelling in toes per visual inspection by PT    Time  4    Period  Weeks    Status  New    Target Date  02/18/19        PT Long Term Goals - 01/21/19 1550      PT LONG TERM GOAL #1   Title  Patient will report no more then 2/10 pain in his foot to improve QOL.    Time  8    Period  Weeks    Status  New    Target Date  03/18/19      PT LONG TERM GOAL #2   Title  Wound will be 100% granulated    Time  8    Period  Weeks    Status  New    Target Date  03/18/19              Patient will benefit from skilled therapeutic intervention in order to improve the following deficits and impairments:     Visit Diagnosis: Amputation of toe of right foot ([place])  Difficulty in walking, not elsewhere classified  Open wound of right foot, initial encounter     Problem List Patient Active Problem List   Diagnosis Date Noted  . Pre-ulcerative calluses 04/20/2018  . 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 Diabetic foot ulcer associated with type 2 diabetes mellitus ([place]) 07/07/2014  . Tobacco abuse 07/07/2014  . Infestation, maggots 07/07/2014  . Plantar ulcer of right foot ([place])  05/12/2014  . Pulse weakness   . Peripheral neuropathy   . Cellulitis of right lower extremity 05/09/2014  . Obesity 05/09/2014  .  Cellulitis of right lower leg 05/09/2014  . Hyperglycemia 03/21/2013  . IDDM (insulin dependent diabetes mellitus) 03/21/2013  . Noncompliance with medications due to cost issues 03/21/2013   Lurena Nida, PTA/CLT [phone]  Lurena Nida 02/02/2019, 11:49 AM  [facility] [address] [place], [place], [address] Phone: [phone]   Fax:  [phone]  Name: Claus Silvestro MRN: [id number] Date of Birth: 11-27-1950

## 2019-02-03 ENCOUNTER — Ambulatory Visit (HOSPITAL_COMMUNITY): Payer: Medicare HMO | Admitting: Physical Therapy

## 2019-02-04 ENCOUNTER — Ambulatory Visit (HOSPITAL_COMMUNITY): Payer: Medicare HMO | Admitting: Physical Therapy

## 2019-02-04 ENCOUNTER — Other Ambulatory Visit: Payer: Self-pay

## 2019-02-04 DIAGNOSIS — S98131A Complete traumatic amputation of one right lesser toe, initial encounter: Secondary | ICD-10-CM

## 2019-02-04 DIAGNOSIS — R262 Difficulty in walking, not elsewhere classified: Secondary | ICD-10-CM

## 2019-02-04 DIAGNOSIS — S91301A Unspecified open wound, right foot, initial encounter: Secondary | ICD-10-CM

## 2019-02-04 NOTE — Therapy (Signed)
[place], [place], [address] Phone: [phone]   Fax:  [phone]  Wound Care Therapy  Patient Details  Name: Mitchell [name] MRN: [id number] Date of Birth: 1950-04-11 Referring Provider (PT): [name]   Encounter Date: 02/04/2019  PT End of Session - 02/04/19 1124    Visit Number  7    Number of Visits  16    Date for PT Re-Evaluation  03/18/19    Authorization Type  [employer]na Medicare    Authorization Time Period  POC dates 01/21/19 to 03/18/2019    Authorization - Visit Number  7    Authorization - Number of Visits  10    PT Start Time  1005    PT Stop Time  1040    PT Time Calculation (min)  35 min    Activity Tolerance  Patient tolerated treatment well;No increased pain    Behavior During Therapy  WFL for tasks assessed/performed       Past Medical History:  Diagnosis Date  . Arthritis   . Cellulitis of right lower extremity 05/09/2014  . Diabetes mellitus without complication ([place])   . Diabetic foot ulcer ([place]) 07/07/2014   Status post bedside debridement of multiple right plantar ulcers by podiatrist, Dr. Laurena Spies.  . Hypercholesterolemia   . Maggot infestation    right foot ulcer  . Obesity 05/09/2014  . Tobacco abuse 07/07/2014    Past Surgical History:  Procedure Laterality Date  . APPENDECTOMY    . PERIPHERAL VASCULAR CATHETERIZATION N/A 07/22/2015   Procedure: Abdominal Aortogram w/Lower Extremity;  Surgeon: Elam Dutch, MD;  Location: [facility];  Service: Cardiovascular;  Laterality: N/A;    There were no vitals filed for this visit.              Wound Therapy - 02/04/19 1118    Subjective  no pain or issues today    Patient and Family Stated Goals  for incision/wound to heal    Date of Onset  12/15/18    Prior Treatments  betadine, wet to dry dressings, amputation of thrid toe on R foot    Pain Scale  0-10    Pain Score  0-No pain    Evaluation and Treatment Procedures  Explained to Patient/Family  Yes    Evaluation and Treatment Procedures  agreed to    Wound Properties Date First Assessed: 01/21/19 Time First Assessed: 0915 Wound Type: Diabetic ulcer;Incision - Dehisced Location: Foot Location Orientation: Right;Distal , at base of where third toe would be  Wound Description (Comments): incision site between second and forth toes on right foot Present on Admission: Yes   Dressing Type  Silver hydrofiber;Compression wrap;Abdominal pads   silverhydrofiber, ABD, profore   Dressing Changed  Changed    Dressing Status  Old drainage    Dressing Change Frequency  PRN    % Wound base Red or Granulating  25%    % Wound base Yellow/Fibrinous Exudate  75%    Peri-wound Assessment  Maceration;Induration;Hemosiderin;Edema    Wound Length (cm)  2.8 cm    Wound Width (cm)  0.3 cm    Wound Depth (cm)  1 cm   at least, unsure of exact depth   Wound Volume (cm^3)  0.84 cm^3    Wound Surface Area (cm^2)  0.84 cm^2    Margins  Unattached edges (unapproximated)    Drainage Amount  Moderate    Drainage Description  Odor;Purulent;Serosanguineous  Treatment  Cleansed;Debridement (Selective)    Selective Debridement - Location  distal right foot between 2nd and 4th toes    Selective Debridement - Tools Used  Forceps;Scalpel;Scissors    Selective Debridement - Tissue Removed  slough, devit tissue    Wound Therapy - Clinical Statement  less odor and drainage from wound this session.  Noted increase in granulation and approximation of borders.  Continues to have a great depth with uncertainty of involvement of joint but two areas remain superioer to this region than continues to drain and has not changed in size.  Instructed to keep eye on LE and watch for redness/infection.  Edema and induration overall reduced in LE.  Continued with silver hydrofiber and profore dressings.     Wound Therapy - Functional Problem List  pain with walking, standing and sleeping    Factors  Delaying/Impairing Wound Healing  Diabetes Mellitus;Vascular compromise;Multiple medical problems    Hydrotherapy Plan  Other (comment)   as need pulsed lavage   Wound Therapy - Frequency  2X / week    Wound Therapy - Current Recommendations  PT    Wound Plan  continue with appropriate dressing, debridement of  incision/wound, profore. Measure the wound    Dressing   silver hydrofiber between toes and at incision site, ABD pad, profore and netting #5                PT Short Term Goals - 01/21/19 1548      PT SHORT TERM GOAL #1   Title  PT wound to be 50% granulated    Time  4    Period  Weeks    Status  New    Target Date  02/18/19      PT SHORT TERM GOAL #2   Title  Patient will demonstrate at least 50% improvement in swelling in toes per visual inspection by PT    Time  4    Period  Weeks    Status  New    Target Date  02/18/19        PT Long Term Goals - 01/21/19 1550      PT LONG TERM GOAL #1   Title  Patient will report no more then 2/10 pain in his foot to improve QOL.    Time  8    Period  Weeks    Status  New    Target Date  03/18/19      PT LONG TERM GOAL #2   Title  Wound will be 100% granulated    Time  8    Period  Weeks    Status  New    Target Date  03/18/19              Patient will benefit from skilled therapeutic intervention in order to improve the following deficits and impairments:     Visit Diagnosis: Difficulty in walking, not elsewhere classified  Open wound of right foot, initial encounter  Amputation of toe of right foot [facility])     Problem List Patient Active Problem List   Diagnosis Date Noted  . Pre-ulcerative calluses 04/20/2018  . 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 Diabetic foot ulcer associated with type 2 diabetes mellitus (HCC) 07/07/2014  . Tobacco abuse 07/07/2014  . Infestation, maggots 07/07/2014  . Plantar ulcer of right foot (HCC) 05/12/2014  . Pulse weakness   . Peripheral neuropathy   . Cellulitis of  right lower extremity 05/09/2014  . Obesity 05/09/2014  .  Cellulitis of right lower leg 05/09/2014  . Hyperglycemia 03/21/2013  . IDDM (insulin dependent diabetes mellitus) 03/21/2013  . Noncompliance with medications due to cost issues 03/21/2013   Lurena Nida, PTA/CLT [phone]  Lurena Nida 02/04/2019, 11:26 AM  [place] [facility] [place] [address] [place], [place], [address] Phone: [phone]   Fax:  [phone]  Name: Bubber Rothert MRN: [id number] Date of Birth: March 29, 1950

## 2019-02-05 ENCOUNTER — Ambulatory Visit (HOSPITAL_COMMUNITY): Payer: Medicare HMO | Admitting: Physical Therapy

## 2019-02-05 ENCOUNTER — Telehealth: Payer: Self-pay

## 2019-02-05 NOTE — Telephone Encounter (Signed)
Mri has been approved  Approval # [id number] Case # [id number]

## 2019-02-05 NOTE — Telephone Encounter (Signed)
Faxed orders and PA information to [facility].

## 2019-02-06 ENCOUNTER — Ambulatory Visit (HOSPITAL_COMMUNITY): Payer: Medicare HMO | Admitting: Physical Therapy

## 2019-02-06 ENCOUNTER — Other Ambulatory Visit: Payer: Self-pay

## 2019-02-06 DIAGNOSIS — S98131A Complete traumatic amputation of one right lesser toe, initial encounter: Secondary | ICD-10-CM

## 2019-02-06 DIAGNOSIS — R262 Difficulty in walking, not elsewhere classified: Secondary | ICD-10-CM

## 2019-02-06 DIAGNOSIS — S91301A Unspecified open wound, right foot, initial encounter: Secondary | ICD-10-CM

## 2019-02-06 NOTE — Therapy (Signed)
[place], [place], [address] Phone: [phone]   Fax:  [phone]  Wound Care Therapy  Patient Details  Name: Mitchell [name] MRN: [id number] Date of Birth: [birth year] Referring Provider (PT): [name]   Encounter Date: 02/06/2019  PT End of Session - 02/06/19 1329    Visit Number  8    Number of Visits  16    Date for PT Re-Evaluation  03/18/19    Authorization Type  [employer] Medicare    Authorization Time Period  POC dates 01/21/19 to 03/18/2019    Authorization - Visit Number  8    Authorization - Number of Visits  10    PT Start Time  5400    PT Stop Time  8676    PT Time Calculation (min)  50 min    Activity Tolerance  Patient tolerated treatment well;No increased pain    Behavior During Therapy  WFL for tasks assessed/performed       Past Medical History:  Diagnosis Date  . Arthritis   . Cellulitis of right lower extremity 05/09/2014  . Diabetes mellitus without complication ([place])   . Diabetic foot ulcer ([place]) 07/07/2014   Status post bedside debridement of multiple right plantar ulcers by podiatrist, Dr. Laurena Spies.  . Hypercholesterolemia   . Maggot infestation    right foot ulcer  . Obesity 05/09/2014  . Tobacco abuse 07/07/2014    Past Surgical History:  Procedure Laterality Date  . APPENDECTOMY    . PERIPHERAL VASCULAR CATHETERIZATION N/A 07/22/2015   Procedure: Abdominal Aortogram w/Lower Extremity;  Surgeon: Elam Dutch, MD;  Location: [facility];  Service: Cardiovascular;  Laterality: N/A;    There were no vitals filed for this visit.     [facility] PT Assessment - 02/06/19 0001      Assessment   Medical Diagnosis  diabetic ulcer of toe of right foot     Referring Provider (PT)  [name]    Onset Date/Surgical Date  12/15/18              Wound Therapy - 02/06/19 1336    Subjective  States that he feels there is less drainage and he has not had any pain.     Patient and  Family Stated Goals  for incision/wound to heal    Date of Onset  12/15/18    Prior Treatments  betadine, wet to dry dressings, amputation of thrid toe on R foot    Pain Scale  0-10    Pain Score  0-No pain    Evaluation and Treatment Procedures Explained to Patient/Family  Yes    Evaluation and Treatment Procedures  agreed to    Wound Properties Date First Assessed: 01/21/19 Time First Assessed: 0915 Wound Type: Diabetic ulcer;Incision - Dehisced Location: Foot Location Orientation: Right;Distal , at base of where third toe would be  Wound Description (Comments): incision site between second and forth toes on right foot Present on Admission: Yes   Dressing Type  Silver hydrofiber;Compression wrap;Abdominal pads    Dressing Changed  Changed    Dressing Status  Old drainage    Dressing Change Frequency  PRN    Site / Wound Assessment  Other (Comment)    % Wound base Red or Granulating  25%    % Wound base Yellow/Fibrinous Exudate  75%    Peri-wound Assessment  Maceration;Induration;Hemosiderin;Edema    Tunneling (cm)  unable to determine    Margins  Unattached edges (unapproximated)    Drainage Amount  Moderate    Drainage Description  Odor;Purulent;Serosanguineous    Treatment  Cleansed;Debridement (Selective);Hydrotherapy (Ultrasonic mist)    Wound Properties Date First Assessed: 02/06/19 Time First Assessed: 1235 Wound Type: Other (Comment) Location: Foot Location Orientation: Right;Anterior;Mid;Lateral Wound Description (Comments): wound near base of 3rd/4th met bases Present on Admission: No   Dressing Type  Impregnated gauze (bismuth)    Dressing Changed  New    Dressing Status  Old drainage    Dressing Change Frequency  PRN    Site / Wound Assessment  Brown;Dusky    % Wound base Red or Granulating  0%    % Wound base Other/Granulation Tissue (Comment)  100%    Peri-wound Assessment  Intact;Edema;Induration    Wound Length (cm)  0.5 cm    Wound Width (cm)  0.6 cm    Wound Depth  (cm)  0.3 cm    Wound Volume (cm^3)  0.09 cm^3    Wound Surface Area (cm^2)  0.3 cm^2    Tunneling (cm)  unable to determine    Margins  Epibole (rolled edges)    Drainage Amount  Scant    Treatment  Cleansed    Selective Debridement - Location  distal right foot between 2nd and 4th toes    Selective Debridement - Tools Used  Forceps    Selective Debridement - Tissue Removed  slough, devit tissue    Wound Therapy - Clinical Statement  Patient had less drainage on this date. Anterior wounds that were being monitored were assessed on this date. The wound on the medial side seems to be healing well. The wound on the lateral side seems to be deeper and possibly connected to the distal incision site. Measured this wound and added to documentation. Added pulse lavage to the distal wound and requested internal order from treating MD for wound care for lateral anterior wound. Patient may benefit from pulse lavage at both wound sites incase there is tract between the two wounds. Applied xeroform over anterior wounds and silver hydro fiber over distal incision site. Patient would continue to benefit from skilled physical therapy focusing on promoting optimal wound healing.     Wound Therapy - Functional Problem List  pain with walking, standing and sleeping    Factors Delaying/Impairing Wound Healing  Diabetes Mellitus;Vascular compromise;Multiple medical problems    Hydrotherapy Plan  Debridement;Pulsatile lavage with suction    Wound Therapy - Frequency  3X / week    Wound Therapy - Current Recommendations  PT    Wound Plan  continue with appropriate dressing, debridement of  incision/wound, profore. Measure the wound, monitor and treat anterior wound - order received by MD for anterior wound treatment. -pulse lavage as needed.     Dressing   silver hydrofiber between toes and at incision site, ABD pad, profore and netting #5, xeroform on anterior surface wounds on foot                PT Short  Term Goals - 01/21/19 1548      PT SHORT TERM GOAL #1   Title  PT wound to be 50% granulated    Time  4    Period  Weeks    Status  New    Target Date  02/18/19      PT SHORT TERM GOAL #2   Title  Patient will demonstrate at least 50% improvement in swelling in toes per visual inspection by PT  Time  4    Period  Weeks    Status  New    Target Date  02/18/19        PT Long Term Goals - 01/21/19 1550      PT LONG TERM GOAL #1   Title  Patient will report no more then 2/10 pain in his foot to improve QOL.    Time  8    Period  Weeks    Status  New    Target Date  03/18/19      PT LONG TERM GOAL #2   Title  Wound will be 100% granulated    Time  8    Period  Weeks    Status  New    Target Date  03/18/19            Plan - 02/06/19 1329    Clinical Impression Statement  see above    Personal Factors and Comorbidities  Age;Comorbidity 1;Comorbidity 2;Fitness    Examination-Activity Limitations  Bathing;Dressing;Hygiene/Grooming;Locomotion Level;Stand;Stairs;Squat;Sleep;Transfers    Examination-Participation Restrictions  Cleaning;Driving;Community Activity    Stability/Clinical Decision Making  Evolving/Moderate complexity    Rehab Potential  Good    PT Frequency  2x / week    PT Duration  8 weeks    PT Treatment/Interventions  Other (comment);Compression bandaging;Scar mobilization;Patient/family education;Manual techniques   wound debridment, pulse lavage   PT Next Visit Plan  continue with POC    Consulted and Agree with Plan of Care  Patient       Patient will benefit from skilled therapeutic intervention in order to improve the following deficits and impairments:  Abnormal gait, Decreased activity tolerance, Decreased balance, Decreased endurance, Decreased knowledge of precautions, Decreased mobility, Decreased range of motion, Difficulty walking, Pain, Other (comment), Increased edema, Impaired sensation, Decreased skin integrity(wound)  Visit  Diagnosis: Difficulty in walking, not elsewhere classified  Open wound of right foot, initial encounter  Amputation of toe of right foot [facility])     Problem List Patient Active Problem List   Diagnosis Date Noted  . Pre-ulcerative calluses 04/20/2018  . 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 Diabetic foot ulcer associated with type 2 diabetes mellitus ([place]) 07/07/2014  . Tobacco abuse 07/07/2014  . Infestation, maggots 07/07/2014  . Plantar ulcer of right foot ([place]) 05/12/2014  . Pulse weakness   . Peripheral neuropathy   . Cellulitis of right lower extremity 05/09/2014  . Obesity 05/09/2014  . Cellulitis of right lower leg 05/09/2014  . Hyperglycemia 03/21/2013  . IDDM (insulin dependent diabetes mellitus) 03/21/2013  . Noncompliance with medications due to cost issues 03/21/2013   1:42 PM, 02/06/19 Jerene Pitch, DPT Physical Therapy with [facility]  [phone] office  [place] [address] [place], [place], [address] Phone: [phone]   Fax:  [phone]  Name: Aurthur Wingerter MRN: [id number] Date of Birth: 10/10/1950

## 2019-02-09 ENCOUNTER — Ambulatory Visit (HOSPITAL_COMMUNITY): Payer: Medicare HMO | Admitting: Physical Therapy

## 2019-02-09 ENCOUNTER — Other Ambulatory Visit: Payer: Self-pay

## 2019-02-09 DIAGNOSIS — S98131A Complete traumatic amputation of one right lesser toe, initial encounter: Secondary | ICD-10-CM

## 2019-02-09 DIAGNOSIS — S91301A Unspecified open wound, right foot, initial encounter: Secondary | ICD-10-CM

## 2019-02-09 DIAGNOSIS — R262 Difficulty in walking, not elsewhere classified: Secondary | ICD-10-CM

## 2019-02-09 NOTE — Therapy (Signed)
[place], [place], [address] Phone: [phone]   Fax:  [phone]  Wound Care Therapy  Patient Details  Name: Mitchell [name] MRN: [id number] Date of Birth: 07-Apr-1950 Referring Provider (PT): [name]   Encounter Date: 02/09/2019  PT End of Session - 02/09/19 1511    Visit Number  9    Number of Visits  16    Date for PT Re-Evaluation  03/18/19    Authorization Type  [employer] Medicare    Authorization Time Period  POC dates 01/21/19 to 03/18/2019    Authorization - Visit Number  9    Authorization - Number of Visits  10    PT Start Time  0922    PT Stop Time  1004    PT Time Calculation (min)  42 min    Activity Tolerance  Patient tolerated treatment well;No increased pain    Behavior During Therapy  WFL for tasks assessed/performed       Past Medical History:  Diagnosis Date  . Arthritis   . Cellulitis of right lower extremity 05/09/2014  . Diabetes mellitus without complication ([place])   . Diabetic foot ulcer ([place]) 07/07/2014   Status post bedside debridement of multiple right plantar ulcers by podiatrist, Dr. Laurena Spies.  . Hypercholesterolemia   . Maggot infestation    right foot ulcer  . Obesity 05/09/2014  . Tobacco abuse 07/07/2014    Past Surgical History:  Procedure Laterality Date  . APPENDECTOMY    . PERIPHERAL VASCULAR CATHETERIZATION N/A 07/22/2015   Procedure: Abdominal Aortogram w/Lower Extremity;  Surgeon: Elam Dutch, MD;  Location: [facility];  Service: Cardiovascular;  Laterality: N/A;    There were no vitals filed for this visit.              Wound Therapy - 02/09/19 1504    Subjective  States that he feels there is less drainage and he has not had any pain.     Patient and Family Stated Goals  for incision/wound to heal    Date of Onset  12/15/18    Prior Treatments  betadine, wet to dry dressings, amputation of thrid toe on R foot    Evaluation and Treatment Procedures  Explained to Patient/Family  Yes    Evaluation and Treatment Procedures  agreed to    Wound Properties Date First Assessed: 01/21/19 Time First Assessed: 0915 Wound Type: Diabetic ulcer;Incision - Dehisced Location: Foot Location Orientation: Right;Distal , at base of where third toe would be  Wound Description (Comments): incision site between second and forth toes on right foot Present on Admission: Yes   Dressing Type  Silver hydrofiber;Compression wrap;Abdominal pads    Dressing Changed  Changed    Dressing Status  Old drainage    Dressing Change Frequency  PRN    Site / Wound Assessment  Other (Comment)    % Wound base Red or Granulating  25%    % Wound base Yellow/Fibrinous Exudate  75%    Peri-wound Assessment  Maceration;Induration;Hemosiderin;Edema    Margins  Unattached edges (unapproximated)    Drainage Amount  Moderate    Drainage Description  Odor;Purulent;Serosanguineous    Treatment  Hydrotherapy (Pulse lavage);Debridement (Selective)    Wound Properties Date First Assessed: 02/06/19 Time First Assessed: 1235 Wound Type: Other (Comment) Location: Foot Location Orientation: Right;Anterior;Mid;Lateral Wound Description (Comments): wound near base of 3rd/4th met bases Present on Admission: No   Dressing Type  Impregnated gauze (bismuth)  Dressing Changed  Changed    Dressing Status  Old drainage    Dressing Change Frequency  PRN    Site / Wound Assessment  Brown;Dusky    % Wound base Red or Granulating  0%    % Wound base Other/Granulation Tissue (Comment)  100%    Peri-wound Assessment  Intact;Edema;Induration    Margins  Epibole (rolled edges)    Drainage Amount  Scant    Treatment  Cleansed    Pulsed lavage therapy - wound location  Rt foot    Pulsed Lavage with Suction (psi)  4 psi    Pulsed Lavage with Suction - Normal Saline Used  1000 mL    Pulsed Lavage Tip  Tip with splash shield    Selective Debridement - Location  distal right foot between 2nd and 4th toes     Selective Debridement - Tools Used  Forceps    Selective Debridement - Tissue Removed  slough, devitalized tissue    Wound Therapy - Clinical Statement  pt with more draiange, however, no signs of infection.  Wound size and overall integrity remains unchanged from last week.  Pt reported Pulse lavage feeling good to wound and could tell it cleansed it well.  Continued this to irrigate wound well.  Packing with silver alginate continued with gauze and ABD padding.  Profore for LE compression.    Wound Therapy - Functional Problem List  pain with walking, standing and sleeping    Factors Delaying/Impairing Wound Healing  Diabetes Mellitus;Vascular compromise;Multiple medical problems    Hydrotherapy Plan  Debridement;Pulsatile lavage with suction    Wound Therapy - Frequency  3X / week    Wound Therapy - Current Recommendations  PT    Wound Plan  continue with appropriate dressing, debridement of  incision/wound, profore. Measure the wound, monitor and treat anterior wound - order received by MD for anterior wound treatment. -pulse lavage as needed.     Dressing   silver hydrofiber between toes and at incision site, ABD pad, profore and netting #5, xeroform on anterior surface wounds on foot                PT Short Term Goals - 01/21/19 1548      PT SHORT TERM GOAL #1   Title  PT wound to be 50% granulated    Time  4    Period  Weeks    Status  New    Target Date  02/18/19      PT SHORT TERM GOAL #2   Title  Patient will demonstrate at least 50% improvement in swelling in toes per visual inspection by PT    Time  4    Period  Weeks    Status  New    Target Date  02/18/19        PT Long Term Goals - 01/21/19 1550      PT LONG TERM GOAL #1   Title  Patient will report no more then 2/10 pain in his foot to improve QOL.    Time  8    Period  Weeks    Status  New    Target Date  03/18/19      PT LONG TERM GOAL #2   Title  Wound will be 100% granulated    Time  8     Period  Weeks    Status  New    Target Date  03/18/19  Plan - 02/09/19 1511    Personal Factors and Comorbidities  Age;Comorbidity 1;Comorbidity 2;Fitness    Examination-Activity Limitations  Bathing;Dressing;Hygiene/Grooming;Locomotion Level;Stand;Stairs;Squat;Sleep;Transfers    Examination-Participation Restrictions  Cleaning;Driving;Community Activity    Stability/Clinical Decision Making  Evolving/Moderate complexity    Rehab Potential  Good    PT Frequency  2x / week    PT Duration  8 weeks    PT Treatment/Interventions  Other (comment);Compression bandaging;Scar mobilization;Patient/family education;Manual techniques   wound debridment, pulse lavage   PT Next Visit Plan  continue with POC    Consulted and Agree with Plan of Care  Patient       Patient will benefit from skilled therapeutic intervention in order to improve the following deficits and impairments:  Abnormal gait, Decreased activity tolerance, Decreased balance, Decreased endurance, Decreased knowledge of precautions, Decreased mobility, Decreased range of motion, Difficulty walking, Pain, Other (comment), Increased edema, Impaired sensation, Decreased skin integrity(wound)  Visit Diagnosis: Open wound of right foot, initial encounter  Amputation of toe of right foot ([place])  Difficulty in walking, not elsewhere classified     Problem List Patient Active Problem List   Diagnosis Date Noted  . Pre-ulcerative calluses 04/20/2018  . 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 Diabetic foot ulcer associated with type 2 diabetes mellitus ([place]) 07/07/2014  . Tobacco abuse 07/07/2014  . Infestation, maggots 07/07/2014  . Plantar ulcer of right foot ([place]) 05/12/2014  . Pulse weakness   . Peripheral neuropathy   . Cellulitis of right lower extremity 05/09/2014  . Obesity 05/09/2014  . Cellulitis of right lower leg 05/09/2014  . Hyperglycemia 03/21/2013  . IDDM (insulin dependent diabetes mellitus) 03/21/2013  .  Noncompliance with medications due to cost issues 03/21/2013  Teena Irani, PTA/CLT [phone]   Teena Irani 02/09/2019, 3:19 PM  [place] [address] [place], [place], [address] Phone: [phone]   Fax:  [phone]  Name: Dorman Calderwood MRN: [id number] Date of Birth: December 17, 1950

## 2019-02-10 ENCOUNTER — Ambulatory Visit (HOSPITAL_COMMUNITY): Payer: Medicare HMO | Admitting: Physical Therapy

## 2019-02-10 ENCOUNTER — Telehealth: Payer: Self-pay | Admitting: *Deleted

## 2019-02-10 NOTE — Telephone Encounter (Addendum)
[facility] states the new [employer] Medicare card of 01/28/2019 is current, need pre-cert for 34/19/6222.

## 2019-02-11 ENCOUNTER — Other Ambulatory Visit: Payer: Self-pay

## 2019-02-11 ENCOUNTER — Ambulatory Visit (HOSPITAL_COMMUNITY): Payer: Medicare HMO

## 2019-02-11 ENCOUNTER — Encounter (HOSPITAL_COMMUNITY): Payer: Self-pay

## 2019-02-11 DIAGNOSIS — E7800 Pure hypercholesterolemia, unspecified: Secondary | ICD-10-CM | POA: Diagnosis not present

## 2019-02-11 DIAGNOSIS — S91301A Unspecified open wound, right foot, initial encounter: Secondary | ICD-10-CM

## 2019-02-11 DIAGNOSIS — E119 Type 2 diabetes mellitus without complications: Secondary | ICD-10-CM | POA: Diagnosis not present

## 2019-02-11 DIAGNOSIS — R262 Difficulty in walking, not elsewhere classified: Secondary | ICD-10-CM

## 2019-02-11 DIAGNOSIS — S98131A Complete traumatic amputation of one right lesser toe, initial encounter: Secondary | ICD-10-CM

## 2019-02-11 NOTE — Therapy (Addendum)
[place], [place], [address] Phone: [phone]   Fax:  [phone]  Wound Care Therapy Progress Note Reporting Period 01/21/19 to 02/11/19  See note below for Objective Data and Assessment of Progress/Goals.  Patient has met 1/2 short and 1/2 long term goals at this time. Patient is awaiting MRI to determine if there is deeper infection. Additional wound started at top of foot but is now healing nicely. Patient would continue tot benefit from skilled physical therapy to promote optimal wound healing.   3:40 PM, 02/17/19 Jerene Pitch, DPT Physical Therapy with [facility]  [phone] office   Patient Details  Name: Mitchell [name] MRN: [id number] Date of Birth: 1950/01/29 Referring Provider (PT): [name]   Encounter Date: 02/11/2019  PT End of Session - 02/11/19 1901    Visit Number  10    Number of Visits  16    Date for PT Re-Evaluation  03/18/19    Authorization Type  [employer] Medicare    Authorization Time Period  POC dates 01/21/19 to 03/18/2019    Authorization - Visit Number  10    Authorization - Number of Visits  10    PT Start Time  1003    PT Stop Time  1048    PT Time Calculation (min)  45 min    Activity Tolerance  Patient tolerated treatment well;No increased pain    Behavior During Therapy  WFL for tasks assessed/performed       Past Medical History:  Diagnosis Date  . Arthritis   . Cellulitis of right lower extremity 05/09/2014  . Diabetes mellitus without complication ([place])   . Diabetic foot ulcer ([place]) 07/07/2014   Status post bedside debridement of multiple right plantar ulcers by podiatrist, Dr. Laurena Spies.  . Hypercholesterolemia   . Maggot infestation    right foot ulcer  . Obesity 05/09/2014  . Tobacco abuse 07/07/2014    Past Surgical History:  Procedure Laterality Date  . APPENDECTOMY    . PERIPHERAL VASCULAR CATHETERIZATION N/A 07/22/2015   Procedure: Abdominal  Aortogram w/Lower Extremity;  Surgeon: Elam Dutch, MD;  Location: [facility];  Service: Cardiovascular;  Laterality: N/A;    There were no vitals filed for this visit.   Subjective Assessment - 02/11/19 1852    Subjective  No pain, pt arrived with dressings intact.  Reports MRI scheduled for 02/13/19.  Fearful he may loose all toes.                Wound Therapy - 02/11/19 1853    Subjective  No pain, pt arrived with dressings intact.  Reports MRI scheduled for 02/13/19.  Fearful he may lose all toes.    Patient and Family Stated Goals  for incision/wound to heal    Date of Onset  12/15/18    Prior Treatments  betadine, wet to dry dressings, amputation of thrid toe on R foot    Pain Scale  0-10    Pain Score  0-No pain    Evaluation and Treatment Procedures Explained to Patient/Family  Yes    Evaluation and Treatment Procedures  agreed to    Wound Properties Date First Assessed: 01/21/19 Time First Assessed: 0915 Wound Type: Diabetic ulcer;Incision - Dehisced Location: Foot Location Orientation: Right;Distal , at base of where third toe would be  Wound Description (Comments): incision site between second and forth toes on right foot Present on Admission: Yes   Dressing Type  Silver hydrofiber;Compression wrap;Abdominal pads    Dressing Changed  Changed    Dressing Status  Old drainage    Dressing Change Frequency  PRN    Site / Wound Assessment  Other (Comment)    % Wound base Red or Granulating  25%    % Wound base Yellow/Fibrinous Exudate  75%    Peri-wound Assessment  Maceration;Induration;Hemosiderin;Edema    Wound Length (cm)  2.8 cm    Wound Width (cm)  0.3 cm    Wound Depth (cm)  1 cm    Wound Volume (cm^3)  0.84 cm^3    Wound Surface Area (cm^2)  0.84 cm^2    Margins  Unattached edges (unapproximated)    Drainage Amount  Moderate    Drainage Description  Odor;Purulent;Serosanguineous    Treatment  Hydrotherapy (Pulse lavage);Cleansed;Debridement  (Selective)    Wound Properties Date First Assessed: 02/06/19 Time First Assessed: 1235 Wound Type: Other (Comment) Location: Foot Location Orientation: Right;Anterior;Mid;Lateral Wound Description (Comments): wound near base of 3rd/4th met bases Present on Admission: No   Dressing Type  Silver hydrofiber    Dressing Changed  Changed    Dressing Status  Old drainage    Dressing Change Frequency  PRN    Site / Wound Assessment  Brown;Dusky    % Wound base Red or Granulating  0%    % Wound base Other/Granulation Tissue (Comment)  100%    Peri-wound Assessment  Intact;Edema;Induration    Margins  Epibole (rolled edges)    Drainage Amount  Scant    Treatment  Cleansed    Pulsed lavage therapy - wound location  Rt foot    Pulsed Lavage with Suction (psi)  4 psi    Pulsed Lavage with Suction - Normal Saline Used  1000 mL    Pulsed Lavage Tip  Tip with splash shield    Selective Debridement - Location  distal right foot between 2nd and 4th toes    Selective Debridement - Tools Used  Forceps;Scalpel    Selective Debridement - Tissue Removed  slough, devitalized tissue    Wound Therapy - Clinical Statement  Noted increased edema all toes expecially the 2nd toe.  Wound continues to have purulent odor wiht moderate drainage and macerated tissues present.  Selective debridement for removal of devitalized tissues.  Wound wiht minimal change in size and granulation tissues.  Continues with silver hydrofiber and ABD pad to address drainage, profore wrap for edema control and additional toe wrap to address edema in toes.      Wound Therapy - Functional Problem List  pain with walking, standing and sleeping    Factors Delaying/Impairing Wound Healing  Diabetes Mellitus;Vascular compromise;Multiple medical problems    Hydrotherapy Plan  Debridement;Pulsatile lavage with suction    Wound Therapy - Frequency  3X / week    Wound Therapy - Current Recommendations  PT    Wound Plan  continue with appropriate  dressing, debridement of  incision/wound, profore. Measure the wound, monitor and treat anterior wound - order received by MD for anterior wound treatment. -pulse lavage as needed.     Dressing   silver hydrofiber between toes and at incision site, ABD pad, profore and netting #5, xeroform on anterior surface wounds on foot                PT Short Term Goals - 02/17/19 1537      PT SHORT TERM GOAL #1   Title  PT wound to be 50% granulated  Baseline  both wounds > 50%    Time  4    Period  Weeks    Status  Achieved    Target Date  02/18/19      PT SHORT TERM GOAL #2   Title  Patient will demonstrate at least 50% improvement in swelling in toes per visual inspection by PT    Baseline  Improved but still swollen    Time  4    Period  Weeks    Status  On-going    Target Date  02/18/19        PT Long Term Goals - 02/17/19 1537      PT LONG TERM GOAL #1   Title  Patient will report no more then 2/10 pain in his foot to improve QOL.    Baseline  current pan 0/10    Time  8    Period  Weeks    Status  Achieved      PT LONG TERM GOAL #2   Title  Wound will be 100% granulated    Baseline  one wound 50% one wound 85%    Time  8    Period  Weeks    Status  On-going              Patient will benefit from skilled therapeutic intervention in order to improve the following deficits and impairments:     Visit Diagnosis: Open wound of right foot, initial encounter  Amputation of toe of right foot ([place])  Difficulty in walking, not elsewhere classified     Problem List Patient Active Problem List   Diagnosis Date Noted  . Pre-ulcerative calluses 04/20/2018  . 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 Diabetic foot ulcer associated with type 2 diabetes mellitus ([place]) 07/07/2014  . Tobacco abuse 07/07/2014  . Infestation, maggots 07/07/2014  . Plantar ulcer of right foot ([place]) 05/12/2014  . Pulse weakness   . Peripheral neuropathy   . Cellulitis of right lower extremity  05/09/2014  . Obesity 05/09/2014  . Cellulitis of right lower leg 05/09/2014  . Hyperglycemia 03/21/2013  . IDDM (insulin dependent diabetes mellitus) 03/21/2013  . Noncompliance with medications due to cost issues 03/21/2013    Aldona Lento 02/11/2019, 7:03 PM  [place], [place], [address] Phone: [phone]   Fax:  [phone]  Name: Mitchell [name] MRN: [id number] Date of Birth: [birth year]

## 2019-02-12 ENCOUNTER — Ambulatory Visit (HOSPITAL_COMMUNITY): Payer: Medicare HMO | Admitting: Physical Therapy

## 2019-02-12 DIAGNOSIS — E119 Type 2 diabetes mellitus without complications: Secondary | ICD-10-CM | POA: Diagnosis not present

## 2019-02-12 NOTE — Telephone Encounter (Signed)
[employer] - [employer] states [phone] right foot with and without contrast, AUTHORIZATION:  [id number], VALID 02/05/2019 - 08/04/2019. Faxed to [facility] with orders. I called authorization to [facility] [place].

## 2019-02-12 NOTE — Telephone Encounter (Signed)
[facility] [place] states pt's NEW [employer] MEDICARE [phone] needs prior authorization for [id number] right foot with and without contrast, call [employer] [phone].

## 2019-02-13 ENCOUNTER — Other Ambulatory Visit: Payer: Self-pay

## 2019-02-13 ENCOUNTER — Ambulatory Visit: Payer: Medicare HMO

## 2019-02-13 ENCOUNTER — Encounter (HOSPITAL_COMMUNITY): Payer: Self-pay | Admitting: Emergency Medicine

## 2019-02-13 ENCOUNTER — Emergency Department (HOSPITAL_COMMUNITY): Payer: Medicare HMO

## 2019-02-13 ENCOUNTER — Encounter (HOSPITAL_COMMUNITY): Payer: Self-pay

## 2019-02-13 ENCOUNTER — Emergency Department (HOSPITAL_COMMUNITY)
Admission: EM | Admit: 2019-02-13 | Discharge: 2019-02-13 | Disposition: A | Discharge status: Home / Self Care | Payer: Medicare HMO | Source: Home / Self Care | Attending: Emergency Medicine | Admitting: Emergency Medicine

## 2019-02-13 ENCOUNTER — Ambulatory Visit (HOSPITAL_COMMUNITY): Payer: Medicare HMO

## 2019-02-13 DIAGNOSIS — S42201A Unspecified fracture of upper end of right humerus, initial encounter for closed fracture: Secondary | ICD-10-CM

## 2019-02-13 DIAGNOSIS — Z7982 Long term (current) use of aspirin: Secondary | ICD-10-CM | POA: Insufficient documentation

## 2019-02-13 DIAGNOSIS — S6991XA Unspecified injury of right wrist, hand and finger(s), initial encounter: Secondary | ICD-10-CM | POA: Diagnosis not present

## 2019-02-13 DIAGNOSIS — Y999 Unspecified external cause status: Secondary | ICD-10-CM | POA: Insufficient documentation

## 2019-02-13 DIAGNOSIS — R0781 Pleurodynia: Secondary | ICD-10-CM | POA: Diagnosis not present

## 2019-02-13 DIAGNOSIS — S98131A Complete traumatic amputation of one right lesser toe, initial encounter: Secondary | ICD-10-CM

## 2019-02-13 DIAGNOSIS — M25519 Pain in unspecified shoulder: Secondary | ICD-10-CM | POA: Diagnosis not present

## 2019-02-13 DIAGNOSIS — R52 Pain, unspecified: Secondary | ICD-10-CM | POA: Diagnosis not present

## 2019-02-13 DIAGNOSIS — S59911A Unspecified injury of right forearm, initial encounter: Secondary | ICD-10-CM | POA: Diagnosis not present

## 2019-02-13 DIAGNOSIS — E1165 Type 2 diabetes mellitus with hyperglycemia: Secondary | ICD-10-CM | POA: Diagnosis not present

## 2019-02-13 DIAGNOSIS — R519 Headache, unspecified: Secondary | ICD-10-CM | POA: Insufficient documentation

## 2019-02-13 DIAGNOSIS — W010XXA Fall on same level from slipping, tripping and stumbling without subsequent striking against object, initial encounter: Secondary | ICD-10-CM | POA: Insufficient documentation

## 2019-02-13 DIAGNOSIS — S91301A Unspecified open wound, right foot, initial encounter: Secondary | ICD-10-CM

## 2019-02-13 DIAGNOSIS — W19XXXA Unspecified fall, initial encounter: Secondary | ICD-10-CM | POA: Diagnosis not present

## 2019-02-13 DIAGNOSIS — M79641 Pain in right hand: Secondary | ICD-10-CM | POA: Diagnosis not present

## 2019-02-13 DIAGNOSIS — S42291A Other displaced fracture of upper end of right humerus, initial encounter for closed fracture: Secondary | ICD-10-CM | POA: Diagnosis not present

## 2019-02-13 DIAGNOSIS — F17210 Nicotine dependence, cigarettes, uncomplicated: Secondary | ICD-10-CM | POA: Insufficient documentation

## 2019-02-13 DIAGNOSIS — M79631 Pain in right forearm: Secondary | ICD-10-CM | POA: Diagnosis not present

## 2019-02-13 DIAGNOSIS — M542 Cervicalgia: Secondary | ICD-10-CM | POA: Diagnosis not present

## 2019-02-13 DIAGNOSIS — S199XXA Unspecified injury of neck, initial encounter: Secondary | ICD-10-CM | POA: Diagnosis not present

## 2019-02-13 DIAGNOSIS — R262 Difficulty in walking, not elsewhere classified: Secondary | ICD-10-CM

## 2019-02-13 DIAGNOSIS — Y92481 Parking lot as the place of occurrence of the external cause: Secondary | ICD-10-CM | POA: Insufficient documentation

## 2019-02-13 DIAGNOSIS — Z79899 Other long term (current) drug therapy: Secondary | ICD-10-CM | POA: Insufficient documentation

## 2019-02-13 DIAGNOSIS — Y9301 Activity, walking, marching and hiking: Secondary | ICD-10-CM | POA: Insufficient documentation

## 2019-02-13 DIAGNOSIS — Z7984 Long term (current) use of oral hypoglycemic drugs: Secondary | ICD-10-CM | POA: Insufficient documentation

## 2019-02-13 DIAGNOSIS — S0990XA Unspecified injury of head, initial encounter: Secondary | ICD-10-CM | POA: Diagnosis not present

## 2019-02-13 DIAGNOSIS — E119 Type 2 diabetes mellitus without complications: Secondary | ICD-10-CM | POA: Insufficient documentation

## 2019-02-13 MED ORDER — HYDROCODONE-ACETAMINOPHEN 5-325 MG PO TABS
1.0000 | ORAL_TABLET | Freq: Once | ORAL | Status: AC
  Administered 2019-02-13: 1 via ORAL
  Filled 2019-02-13: qty 1

## 2019-02-13 MED ORDER — TETANUS-DIPHTH-ACELL PERTUSSIS 5-2.5-18.5 LF-MCG/0.5 IM SUSP: 0.5000 mL | Freq: Once | INTRAMUSCULAR | Status: DC

## 2019-02-13 MED ORDER — HYDROCODONE-ACETAMINOPHEN 5-325 MG PO TABS: 2.0000 | ORAL_TABLET | ORAL | 0 refills | Status: DC | PRN

## 2019-02-13 NOTE — Discharge Instructions (Signed)
Please return for any problem.  Follow-up with your regular care provider as instructed.  Follow-up with orthopedics as instructed.  Use sling as instructed for comfort.

## 2019-02-13 NOTE — ED Triage Notes (Signed)
Per [facility] pt was going in to [place] ea bathroom room at a restaurant and missed a step on the curb and fell. Pt c/o right shoulder and right rib pains. Has abrasion to right 5th finger and right forearm. Takes baby ASA daily. Denies LOC.  140/90, 90HR, 16R, 98.6, 95%, CBg 337.

## 2019-02-13 NOTE — Therapy (Signed)
[place], [place], [address] Phone: [phone]   Fax:  [phone]  Wound Care Therapy  Patient Details  Name: Mitchell [name] MRN: [id number] Date of Birth: 01/27/1950 Referring Provider (PT): [name]   Encounter Date: 02/13/2019  PT End of Session - 02/13/19 1303    Visit Number  11    Number of Visits  16    Date for PT Re-Evaluation  03/18/19    Authorization Type  [employer] Medicare    Authorization Time Period  POC dates 01/21/19 to 03/18/2019    Authorization - Visit Number  11    Authorization - Number of Visits  20    PT Start Time  0923    PT Stop Time  1005    PT Time Calculation (min)  42 min    Activity Tolerance  Patient tolerated treatment well;No increased pain    Behavior During Therapy  WFL for tasks assessed/performed       Past Medical History:  Diagnosis Date  . Arthritis   . Cellulitis of right lower extremity 05/09/2014  . Diabetes mellitus without complication ([place])   . Diabetic foot ulcer ([place]) 07/07/2014   Status post bedside debridement of multiple right plantar ulcers by podiatrist, Dr. Laurena Spies.  . Hypercholesterolemia   . Maggot infestation    right foot ulcer  . Obesity 05/09/2014  . Tobacco abuse 07/07/2014    Past Surgical History:  Procedure Laterality Date  . APPENDECTOMY    . PERIPHERAL VASCULAR CATHETERIZATION N/A 07/22/2015   Procedure: Abdominal Aortogram w/Lower Extremity;  Surgeon: Elam Dutch, MD;  Location: [facility];  Service: Cardiovascular;  Laterality: N/A;    There were no vitals filed for this visit.   Subjective Assessment - 02/13/19 1251    Subjective  MRI scheduled later today.  No reports of pain, dressings intact.    Currently in Pain?  No/denies                Wound Therapy - 02/13/19 1252    Subjective  MRI scheduled later today.  No reports of pain, dressings intact.    Patient and Family Stated Goals  for incision/wound to heal     Date of Onset  12/15/18    Prior Treatments  betadine, wet to dry dressings, amputation of thrid toe on R foot    Pain Scale  0-10    Pain Score  0-No pain    Evaluation and Treatment Procedures Explained to Patient/Family  Yes    Evaluation and Treatment Procedures  agreed to    Wound Properties Date First Assessed: 02/06/19 Time First Assessed: 1235 Wound Type: Other (Comment) Location: Foot Location Orientation: Right;Anterior;Mid;Lateral Wound Description (Comments): wound near base of 3rd/4th met bases Present on Admission: No   Dressing Type  Silver hydrofiber   Silverhydrofiber, Toe wrap, ABD, profore   Dressing Changed  Changed    Dressing Status  Old drainage    Dressing Change Frequency  PRN    Site / Wound Assessment  Brown;Dusky    % Wound base Red or Granulating  0%    % Wound base Other/Granulation Tissue (Comment)  100%    Peri-wound Assessment  Intact;Edema;Induration    Wound Length (cm)  0.5 cm    Wound Width (cm)  0.6 cm    Wound Depth (cm)  0.4 cm    Wound Volume (cm^3)  0.12 cm^3    Wound Surface Area (cm^2)  0.3 cm^2    Tunneling (cm)  unable to determine    Margins  Epibole (rolled edges)    Drainage Amount  Minimal    Treatment  Cleansed;Debridement (Selective);Hydrotherapy (Pulse lavage)    Wound Properties Date First Assessed: 01/21/19 Time First Assessed: 0915 Wound Type: Diabetic ulcer;Incision - Dehisced Location: Foot Location Orientation: Right;Distal , at base of where third toe would be  Wound Description (Comments): incision site between second and forth toes on right foot Present on Admission: Yes   Dressing Type  Silver hydrofiber;Compression wrap    Dressing Changed  Changed    Dressing Status  Old drainage    Dressing Change Frequency  PRN    Site / Wound Assessment  Other (Comment)    % Wound base Red or Granulating  25%    % Wound base Yellow/Fibrinous Exudate  75%    Peri-wound Assessment  Maceration;Induration;Hemosiderin;Edema    Wound  Length (cm)  2.8 cm    Wound Width (cm)  0.8 cm    Wound Depth (cm)  2.6 cm    Wound Volume (cm^3)  5.82 cm^3    Wound Surface Area (cm^2)  2.24 cm^2    Tunneling (cm)  unable to determine    Margins  Unattached edges (unapproximated)    Drainage Amount  Moderate    Drainage Description  Odor;Purulent;Serosanguineous    Treatment  Hydrotherapy (Pulse lavage);Cleansed;Debridement (Selective)    Pulsed lavage therapy - wound location  Rt foot    Pulsed Lavage with Suction (psi)  4 psi    Pulsed Lavage with Suction - Normal Saline Used  1000 mL    Pulsed Lavage Tip  Tip with splash shield    Selective Debridement - Location  distal right foot between 2nd and 4th toes    Selective Debridement - Tools Used  Forceps;Scalpel    Selective Debridement - Tissue Removed  slough, devitalized tissue    Wound Therapy - Clinical Statement  Increased drainage from dorsal aspect of foot today.  Continued PLS to toe wound and dorsal aspect, unable to determine tunneling.  Changed to silverhydrofiber on dorsal asepct to address draininage and selective debriement for removal of macerated tissue to promote healing.  Toes have decreased in size following additional toe wrap last session.  Continued wiht same dressings and compression for edema control.  No reports of pain through session.  MRI apt later today.    Wound Therapy - Functional Problem List  pain with walking, standing and sleeping    Factors Delaying/Impairing Wound Healing  Diabetes Mellitus;Vascular compromise;Multiple medical problems    Hydrotherapy Plan  Debridement;Pulsatile lavage with suction    Wound Therapy - Frequency  3X / week    Wound Therapy - Current Recommendations  PT    Wound Plan  continue with appropriate dressing, debridement of  incision/wound, profore. Measure the wound, monitor and treat anterior wound - order received by MD for anterior wound treatment. -pulse lavage as needed.     Dressing   Silverhydrofiber, Toe wrap, ABD,  profore                PT Short Term Goals - 01/21/19 1548      PT SHORT TERM GOAL #1   Title  PT wound to be 50% granulated    Time  4    Period  Weeks    Status  New    Target Date  02/18/19      PT SHORT TERM GOAL #2  Title  Patient will demonstrate at least 50% improvement in swelling in toes per visual inspection by PT    Time  4    Period  Weeks    Status  New    Target Date  02/18/19        PT Long Term Goals - 01/21/19 1550      PT LONG TERM GOAL #1   Title  Patient will report no more then 2/10 pain in his foot to improve QOL.    Time  8    Period  Weeks    Status  New    Target Date  03/18/19      PT LONG TERM GOAL #2   Title  Wound will be 100% granulated    Time  8    Period  Weeks    Status  New    Target Date  03/18/19              Patient will benefit from skilled therapeutic intervention in order to improve the following deficits and impairments:     Visit Diagnosis: Open wound of right foot, initial encounter  Amputation of toe of right foot ([place])  Difficulty in walking, not elsewhere classified     Problem List Patient Active Problem List   Diagnosis Date Noted  . Pre-ulcerative calluses 04/20/2018  . 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 Diabetic foot ulcer associated with type 2 diabetes mellitus ([place]) 07/07/2014  . Tobacco abuse 07/07/2014  . Infestation, maggots 07/07/2014  . Plantar ulcer of right foot ([place]) 05/12/2014  . Pulse weakness   . Peripheral neuropathy   . Cellulitis of right lower extremity 05/09/2014  . Obesity 05/09/2014  . Cellulitis of right lower leg 05/09/2014  . Hyperglycemia 03/21/2013  . IDDM (insulin dependent diabetes mellitus) 03/21/2013  . Noncompliance with medications due to cost issues 03/21/2013   Ihor Austin, [place]; [place]  Aldona Lento 02/13/2019, 1:04 PM  [place] [address] [place], [place], [address] Phone:  [phone]   Fax:  [phone]  Name: Mitchell [name] MRN: [id number] Date of Birth: Oct 07, 1950

## 2019-02-13 NOTE — ED Notes (Signed)
Pt uncomfortable in bed. Wanting to sit in chair. assisted patient out of bed and into chair.

## 2019-02-13 NOTE — ED Notes (Signed)
Ortho made aware of immobilizer sling.

## 2019-02-13 NOTE — ED Provider Notes (Signed)
[facility] DEPT Provider Note   CSN: [id number] Arrival date & time: 02/13/19  1250     History Chief Complaint  Patient presents with  . Fall  . Shoulder Pain    Mitchell [name] is a 69 y.o. male.  69 year old male with prior medical history as detailed below presents for evaluation following reported fall.  Patient was exiting [employer] restaurant when he missed the curb and fell.  He landed on his anterior right shoulder.  He complains of pain to the right shoulder and right lateral ribs.  He is also reporting that he struck his head.  He did not pass out.  He denies neck pain.  He has a superficial abrasion noted to the right fifth digit.  There are also superficial abrasions noted on the right forearm.  Patient is alert and oriented.  He is otherwise without specific complaint.  He is unsure of his last tetanus.  The history is provided by the patient and medical records.  Fall This is a new problem. The current episode started 1 to 2 hours ago. The problem occurs rarely. The problem has not changed since onset.Pertinent negatives include no chest pain and no abdominal pain. Exacerbated by: movement. The symptoms are relieved by position.  Shoulder Pain Location:  Shoulder Shoulder location:  R shoulder Injury: yes   Time since incident:  1 hour Mechanism of injury: fall   Pain details:    Quality:  Aching   Severity:  Moderate   Onset quality:  Sudden   Timing:  Constant   Progression:  Unchanged Handedness:  Right-handed Tetanus status:  Unknown Relieved by:  Nothing      Past Medical History:  Diagnosis Date  . Arthritis   . Cellulitis of right lower extremity 05/09/2014  . Diabetes mellitus without complication ([place])   . Diabetic foot ulcer ([place]) 07/07/2014   Status post bedside debridement of multiple right plantar ulcers by podiatrist, Dr. Laurena Spies.  . Hypercholesterolemia   . Maggot infestation    right foot ulcer  . Obesity  05/09/2014  . Tobacco abuse 07/07/2014    Patient Active Problem List   Diagnosis Date Noted  . Pre-ulcerative calluses 04/20/2018  . 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 Diabetic foot ulcer associated with type 2 diabetes mellitus ([place]) 07/07/2014  . Tobacco abuse 07/07/2014  . Infestation, maggots 07/07/2014  . Plantar ulcer of right foot ([place]) 05/12/2014  . Pulse weakness   . Peripheral neuropathy   . Cellulitis of right lower extremity 05/09/2014  . Obesity 05/09/2014  . Cellulitis of right lower leg 05/09/2014  . Hyperglycemia 03/21/2013  . IDDM (insulin dependent diabetes mellitus) 03/21/2013  . Noncompliance with medications due to cost issues 03/21/2013    Past Surgical History:  Procedure Laterality Date  . APPENDECTOMY    . PERIPHERAL VASCULAR CATHETERIZATION N/A 07/22/2015   Procedure: Abdominal Aortogram w/Lower Extremity;  Surgeon: Elam Dutch, MD;  Location: [facility];  Service: Cardiovascular;  Laterality: N/A;       No family history on file.  Social History   Tobacco Use  . Smoking status: Current Every Day Smoker    Packs/day: 1.50    Types: Cigarettes  . Smokeless tobacco: Never Used  Substance Use Topics  . Alcohol use: Yes    Alcohol/week: 0.0 standard drinks    Comment: occ-twice a week beer approx 2  . Drug use: No    Home Medications Prior to Admission medications   Medication Sig Start  Date End Date Taking? Authorizing Provider  aspirin EC 81 MG tablet Take 81 mg by mouth daily.    [provider]  canagliflozin (INVOKANA) 300 MG TABS tablet Take 300 mg by mouth daily before breakfast.    [provider]  doxycycline (VIBRA-TABS) 100 MG tablet Take 1 tablet (100 mg total) by mouth 2 (two) times daily. 12/15/18   Candelaria Stagers, DPM  furosemide (LASIX) 20 MG tablet Take 1 tablet (20 mg total) by mouth daily. 03/22/13   Standley Brooking, MD  gabapentin (NEURONTIN) 300 MG capsule Take 1 capsule (300 mg total) by mouth 3 (three)  times daily. 03/22/13   Standley Brooking, MD  ibuprofen (ADVIL) 800 MG tablet Take 1 tablet (800 mg total) by mouth every 6 (six) hours as needed. 12/15/18   Candelaria Stagers, DPM  JARDIANCE 25 MG TABS tablet  05/12/18   [provider]  lovastatin (MEVACOR) 20 MG tablet Take 1 tablet (20 mg total) by mouth at bedtime. 03/22/13   Standley Brooking, MD  metFORMIN (GLUCOPHAGE) 500 MG tablet Take 1,000 mg by mouth 2 (two) times daily. 05/26/14   [provider]  NOVOLOG FLEXPEN 100 UNIT/ML FlexPen INJECT 15 UNITS SUBCUTANEOUSLY ONCE DAILY WITH BREAKFAST 30 UNITS WITH LUNCH AND 30 UNITS WITH DINNER. MAXIMUM DOSE OF 100 UNITS PER DAY 12/15/18   [provider]  sulfamethoxazole-trimethoprim (BACTRIM DS) 800-160 MG tablet Take 1 tablet by mouth 2 (two) times daily. 03/02/18   [provider]  TRESIBA FLEXTOUCH 200 UNIT/ML SOPN Inject 55 Units into the skin daily. 06/24/15   [provider]  triamcinolone cream (KENALOG) 0.1 % APPLY CREAM EXTERNALLY ONCE DAILY AS DIRECTED 11/18/17   [provider]    Allergies    Penicillins  Review of Systems   Review of Systems  Cardiovascular: Negative for chest pain.  Gastrointestinal: Negative for abdominal pain.  All other systems reviewed and are negative.   Physical Exam Updated Vital Signs BP (!) 178/80 (BP Location: Left Arm)   Pulse 97   Temp 98.1 F (36.7 C) (Oral)   Resp 19   SpO2 98%   Physical Exam Vitals and nursing note reviewed.  Constitutional:      General: He is not in acute distress.    Appearance: Normal appearance. He is well-developed.  HENT:     Head: Normocephalic and atraumatic.  Eyes:     Conjunctiva/sclera: Conjunctivae normal.     Pupils: Pupils are equal, round, and reactive to light.  Cardiovascular:     Rate and Rhythm: Normal rate and regular rhythm.     Heart sounds: Normal heart sounds.  Pulmonary:     Effort: Pulmonary effort is normal. No respiratory distress.      Breath sounds: Normal breath sounds.  Abdominal:     General: There is no distension.     Palpations: Abdomen is soft.     Tenderness: There is no abdominal tenderness.  Musculoskeletal:        General: Tenderness present. No deformity. Normal range of motion.     Cervical back: Normal range of motion and neck supple.     Comments: Tenderness noted to the anterior and lateral aspects of the right shoulder.  No bony step-off noted.  Abrasions noted to the anterior aspect of the right forearm.  Small abrasion noted to the lateral aspect of the right fifth digit.  Distal right upper extremity is neurovascular intact  Skin:    General:  Skin is warm and dry.  Neurological:     General: No focal deficit present.     Mental Status: He is alert and oriented to person, place, and time. Mental status is at baseline.     Comments: Alert and oriented, GCS 15       ED Results / Procedures / Treatments   Labs (all labs ordered are listed, but only abnormal results are displayed) Labs Reviewed - No data to display  EKG None  Radiology DG Ribs Unilateral W/Chest Right  Result Date: 02/13/2019 CLINICAL DATA:  Pain after fall. EXAM: RIGHT RIBS AND CHEST - 3+ VIEW COMPARISON:  Chest x-ray October 15, 2018 FINDINGS: The patient has known comminuted right proximal humeral fracture is identified on 1 of these images. No convincing evidence of rib fracture noted. No pneumothorax. No other acute abnormalities are identified. IMPRESSION: No rib fractures or pneumothorax identified. Known right proximal humeral fracture. Electronically Signed   By: Gerome Sam III M.D   On: 02/13/2019 14:08   DG Shoulder Right  Result Date: 02/13/2019 CLINICAL DATA:  Pain after fall EXAM: RIGHT SHOULDER - 2+ VIEW COMPARISON:  None. FINDINGS: There is a comminuted displaced fracture through the right humeral head and neck. No shoulder dislocation is identified. The clavicle and scapula are grossly intact.  IMPRESSION: Comminuted displaced fracture through the right humeral head and neck with no shoulder dislocation. Electronically Signed   By: Gerome Sam III M.D   On: 02/13/2019 13:58   DG Forearm Right  Result Date: 02/13/2019 CLINICAL DATA:  Pain after fall EXAM: RIGHT FOREARM - 2 VIEW COMPARISON:  None. FINDINGS: There is no evidence of fracture or other focal bone lesions. Soft tissues are unremarkable. IMPRESSION: Negative. Electronically Signed   By: Gerome Sam III M.D   On: 02/13/2019 14:02   CT Head Wo Contrast  Result Date: 02/13/2019 CLINICAL DATA:  Fall, headache, on aspirin EXAM: CT HEAD WITHOUT CONTRAST TECHNIQUE: Contiguous axial images were obtained from the base of the skull through the vertex without intravenous contrast. COMPARISON:  None. FINDINGS: Brain: No evidence of acute infarction, hemorrhage, hydrocephalus, extra-axial collection or mass lesion/mass effect. Scattered low-density changes within the periventricular and subcortical white matter compatible with chronic microvascular ischemic change. Mild diffuse cerebral volume loss. Vascular: Mild atherosclerotic calcifications involving the large vessels of the skull base. No unexpected hyperdense vessel. Skull: Normal. Negative for fracture or focal lesion. Sinuses/Orbits: No acute finding. Other: None. IMPRESSION: No acute intracranial findings. Electronically Signed   By: Duanne Guess D.O.   On: 02/13/2019 14:11   CT Cervical Spine Wo Contrast  Result Date: 02/13/2019 CLINICAL DATA:  Fall, neck pain EXAM: CT CERVICAL SPINE WITHOUT CONTRAST TECHNIQUE: Multidetector CT imaging of the cervical spine was performed without intravenous contrast. Multiplanar CT image reconstructions were also generated. COMPARISON:  None. FINDINGS: Alignment: Normal. Facet joints are aligned. No static listhesis. Dens and lateral masses aligned. Skull base and vertebrae: No acute fracture. No primary bone lesion or focal pathologic  process. Soft tissues and spinal canal: No prevertebral fluid or swelling. No visible canal hematoma. Disc levels: Intervertebral disc spaces are relatively preserved. Mild facet arthrosis most pronounced at C2-3 on the right. Upper chest: Visualized lung apices clear. Other: Aortic and carotid atherosclerotic calcifications are noted. 10 mm cutaneous lesion at the left occipital region likely a small sebaceous or epidermoid cyst. IMPRESSION: No acute fracture or static listhesis. Electronically Signed   By: Duanne Guess D.O.   On: 02/13/2019 14:15  DG Hand Complete Right  Result Date: 02/13/2019 CLINICAL DATA:  Pain after fall EXAM: RIGHT HAND - COMPLETE 3+ VIEW COMPARISON:  None. FINDINGS: Scattered degenerative changes in the fingers. No acute fracture or dislocation identified. IMPRESSION: No acute abnormalities. Electronically Signed   By: Gerome Sam III M.D   On: 02/13/2019 14:06    Procedures Procedures (including critical care time)  Medications Ordered in ED Medications  HYDROcodone-acetaminophen (NORCO/VICODIN) 5-325 MG per tablet 1 tablet (has no administration in time range)  Tdap (BOOSTRIX) injection 0.5 mL (has no administration in time range)    ED Course  I have reviewed the triage vital signs and the nursing notes.  Pertinent labs & imaging results that were available during my care of the patient were reviewed by me and considered in my medical decision making (see chart for details).    MDM Rules/Calculators/A&P                      MDM  Screen complete  Mitchell [name] was evaluated in Emergency Department on 02/13/2019 for the symptoms described in the history of present illness. He was evaluated in the context of the global COVID-19 pandemic, which necessitated consideration that the patient might be at risk for infection with the SARS-CoV-2 virus that causes COVID-19. Institutional protocols and algorithms that pertain to the evaluation of patients at risk for  COVID-19 are in a state of rapid change based on information released by regulatory bodies including the CDC and federal and state organizations. These policies and algorithms were followed during the patient's care in the ED.   Patient is presenting for evaluation following mechanical fall after tripping on a curb.  Work-up reveals evidence of right proximal humerus fracture.  No other significant findings on work-up.  Patient is improved following his evaluation.  He does understand the need for close follow-up with orthopedics.  Strict return precautions given.  Final Clinical Impression(s) / ED Diagnoses Final diagnoses:  Fall, initial encounter  Closed fracture of proximal end of right humerus, unspecified fracture morphology, initial encounter    Rx / DC Orders ED Discharge Orders         Ordered    HYDROcodone-acetaminophen (NORCO/VICODIN) 5-325 MG tablet  Every 4 hours PRN     02/13/19 1549           Wynetta Fines, MD 02/13/19 1619

## 2019-02-13 NOTE — ED Notes (Signed)
Pt transported to xray 

## 2019-02-16 ENCOUNTER — Ambulatory Visit (HOSPITAL_COMMUNITY): Payer: Medicare HMO | Attending: Podiatry | Admitting: Physical Therapy

## 2019-02-16 ENCOUNTER — Other Ambulatory Visit: Payer: Self-pay

## 2019-02-16 DIAGNOSIS — R262 Difficulty in walking, not elsewhere classified: Secondary | ICD-10-CM

## 2019-02-16 DIAGNOSIS — S98131A Complete traumatic amputation of one right lesser toe, initial encounter: Secondary | ICD-10-CM

## 2019-02-16 DIAGNOSIS — S91301A Unspecified open wound, right foot, initial encounter: Secondary | ICD-10-CM | POA: Diagnosis not present

## 2019-02-16 NOTE — Therapy (Signed)
[facility] [place], [place], [address] Phone: [phone]   Fax:  [phone]  Wound Care Therapy  Patient Details  Name: Mitchell [name] MRN: [id number] Date of Birth: Aug 31, 1950 Referring Provider (PT): [name]   Encounter Date: 02/16/2019  PT End of Session - 02/16/19 1216    Visit Number  12    Number of Visits  16    Date for PT Re-Evaluation  03/18/19    Authorization Type  [employer]    Authorization Time Period  POC dates 01/21/19 to 03/18/2019    Authorization - Visit Number  12    Authorization - Number of Visits  20    PT Start Time  1005    PT Stop Time  5997    PT Time Calculation (min)  60 min    Activity Tolerance  Patient tolerated treatment well;No increased pain    Behavior During Therapy  WFL for tasks assessed/performed       Past Medical History:  Diagnosis Date  . Arthritis   . Cellulitis of right lower extremity 05/09/2014  . Diabetes mellitus without complication ([place])   . Diabetic foot ulcer ([place]) 07/07/2014   Status post bedside debridement of multiple right plantar ulcers by podiatrist, Dr. Laurena Spies.  . Hypercholesterolemia   . Maggot infestation    right foot ulcer  . Obesity 05/09/2014  . Tobacco abuse 07/07/2014    Past Surgical History:  Procedure Laterality Date  . APPENDECTOMY    . PERIPHERAL VASCULAR CATHETERIZATION N/A 07/22/2015   Procedure: Abdominal Aortogram w/Lower Extremity;  Surgeon: Elam Dutch, MD;  Location: [facility];  Service: Cardiovascular;  Laterality: N/A;    There were no vitals filed for this visit.              Wound Therapy - 02/16/19 1206    Subjective  pt returns today wiith Rt UE up in sling.  STates he fell coming out of [employer] on Friday and had to go to ED.  States he broke his proximal humerus and now has to go see a Psychologist, sport and exercise for this.  States he was unable to get his MRI completed due to this fall.  STates he is going to call and  RS it.    Pain Scale  0-10    Pain Score  0-No pain    Pain Location  Foot    Wound Properties Date First Assessed: 02/06/19 Time First Assessed: 1235 Wound Type: Other (Comment) Location: Foot Location Orientation: Right;Anterior;Mid;Lateral Wound Description (Comments): wound near base of 3rd/4th met bases Present on Admission: No   Dressing Type  Silver hydrofiber    Dressing Changed  Changed    Dressing Status  Old drainage    Dressing Change Frequency  PRN    Site / Wound Assessment  Brown;Dusky    % Wound base Red or Granulating  85%    % Wound base Yellow/Fibrinous Exudate  15%    Peri-wound Assessment  Intact;Edema;Induration    Margins  Epibole (rolled edges)    Drainage Amount  Scant    Drainage Description  Serous    Treatment  Cleansed;Debridement (Selective)    Wound Properties Date First Assessed: 01/21/19 Time First Assessed: 0915 Wound Type: Diabetic ulcer;Incision - Dehisced Location: Foot Location Orientation: Right;Distal , at base of where third toe would be  Wound Description (Comments): incision site between second and forth toes on right foot Present on Admission: Yes  Dressing Type  Silver hydrofiber;Compression wrap    Dressing Changed  Changed    Dressing Status  Old drainage    Dressing Change Frequency  PRN    Site / Wound Assessment  Other (Comment)    % Wound base Red or Granulating  50%    % Wound base Yellow/Fibrinous Exudate  50%    Peri-wound Assessment  Maceration;Induration;Hemosiderin;Edema    Margins  Unattached edges (unapproximated)    Drainage Amount  Minimal    Drainage Description  Serosanguineous    Treatment  Cleansed;Debridement (Selective)    Selective Debridement - Location  distal right foot between 2nd and 4th toes    Selective Debridement - Tools Used  Forceps;Scalpel    Selective Debridement - Tissue Removed  slough, devitalized tissue    Wound Therapy - Clinical Statement  Pt with diffuculty rising from chair without use of Rt UE.   Wounds appear improved today with increased granulation in all areas.  Small opening on dorsal foot is nearly healed with scant driainage and primary wound with increased granulation, less drainage and overall improvement of perimeter and approximation.  Cleansed all areas well and removed dry, callous skin and slough. Continued with silver hydrofiber and  profore.    Wound Therapy - Functional Problem List  pain with walking, standing and sleeping    Factors Delaying/Impairing Wound Healing  Diabetes Mellitus;Vascular compromise;Multiple medical problems    Hydrotherapy Plan  Debridement;Pulsatile lavage with suction    Wound Therapy - Frequency  3X / week    Wound Therapy - Current Recommendations  PT    Wound Plan  continue with appropriate woundcare to promote wound closure.  FU if appt make for MRI                PT Short Term Goals - 01/21/19 1548      PT SHORT TERM GOAL #1   Title  PT wound to be 50% granulated    Time  4    Period  Weeks    Status  New    Target Date  02/18/19      PT SHORT TERM GOAL #2   Title  Patient will demonstrate at least 50% improvement in swelling in toes per visual inspection by PT    Time  4    Period  Weeks    Status  New    Target Date  02/18/19        PT Long Term Goals - 01/21/19 1550      PT LONG TERM GOAL #1   Title  Patient will report no more then 2/10 pain in his foot to improve QOL.    Time  8    Period  Weeks    Status  New    Target Date  03/18/19      PT LONG TERM GOAL #2   Title  Wound will be 100% granulated    Time  8    Period  Weeks    Status  New    Target Date  03/18/19              Patient will benefit from skilled therapeutic intervention in order to improve the following deficits and impairments:     Visit Diagnosis: Amputation of toe of right foot ([place])  Difficulty in walking, not elsewhere classified  Open wound of right foot, initial encounter     Problem List Patient Active  Problem List   Diagnosis Date Noted  .  Pre-ulcerative calluses 04/20/2018  . 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 Diabetic foot ulcer associated with type 2 diabetes mellitus ([place]) 07/07/2014  . Tobacco abuse 07/07/2014  . Infestation, maggots 07/07/2014  . Plantar ulcer of right foot ([place]) 05/12/2014  . Pulse weakness   . Peripheral neuropathy   . Cellulitis of right lower extremity 05/09/2014  . Obesity 05/09/2014  . Cellulitis of right lower leg 05/09/2014  . Hyperglycemia 03/21/2013  . IDDM (insulin dependent diabetes mellitus) 03/21/2013  . Noncompliance with medications due to cost issues 03/21/2013   Teena Irani, PTA/CLT [phone]  Teena Irani 02/16/2019, 12:17 PM  [facility] [place], [place], [address] Phone: [phone]   Fax:  [phone]  Name: Mitchell [name] MRN: [id number] Date of Birth: 12/10/1950

## 2019-02-17 ENCOUNTER — Ambulatory Visit (HOSPITAL_COMMUNITY): Payer: Medicare HMO | Admitting: Physical Therapy

## 2019-02-17 ENCOUNTER — Inpatient Hospital Stay: Payer: Medicare HMO | Admitting: Orthopaedic Surgery

## 2019-02-18 ENCOUNTER — Other Ambulatory Visit: Payer: Self-pay

## 2019-02-18 ENCOUNTER — Ambulatory Visit (INDEPENDENT_AMBULATORY_CARE_PROVIDER_SITE_OTHER): Payer: Medicare HMO | Admitting: Orthopaedic Surgery

## 2019-02-18 ENCOUNTER — Encounter: Payer: Self-pay | Admitting: Orthopaedic Surgery

## 2019-02-18 ENCOUNTER — Ambulatory Visit (INDEPENDENT_AMBULATORY_CARE_PROVIDER_SITE_OTHER): Payer: Medicare HMO | Admitting: Podiatry

## 2019-02-18 DIAGNOSIS — Z9889 Other specified postprocedural states: Secondary | ICD-10-CM

## 2019-02-18 DIAGNOSIS — S98131A Complete traumatic amputation of one right lesser toe, initial encounter: Secondary | ICD-10-CM

## 2019-02-18 DIAGNOSIS — S42291A Other displaced fracture of upper end of right humerus, initial encounter for closed fracture: Secondary | ICD-10-CM | POA: Diagnosis not present

## 2019-02-18 DIAGNOSIS — M80871A Other osteoporosis with current pathological fracture, right ankle and foot, initial encounter for fracture: Secondary | ICD-10-CM | POA: Diagnosis not present

## 2019-02-18 DIAGNOSIS — M869 Osteomyelitis, unspecified: Secondary | ICD-10-CM

## 2019-02-18 MED ORDER — HYDROCODONE-ACETAMINOPHEN 5-325 MG PO TABS: 1.0000 | ORAL_TABLET | Freq: Four times a day (QID) | ORAL | 0 refills | Status: DC | PRN

## 2019-02-18 NOTE — Progress Notes (Signed)
Office Visit Note   Patient: Mitchell [name]           Date of Birth: [birth year]           MRN: [id number] Visit Date: 02/18/2019              Requested by: Kirstie Peri, MD [address] [place],  [place] [address] PCP: Kirstie Peri, MD   Assessment & Plan: Visit Diagnoses:  1. Other closed displaced fracture of proximal end of right humerus, initial encounter     Plan: I feel that we should try to treat this nonoperatively given the patient's smoking history and diabetic history.  He is also been dealing with an active infection in his right foot and had a toe amputation.  He is a higher infection risk and a higher chance of nonunion due to his smoking and his poorly controlled diabetes.  I feel this more appropriate to try to treat this nonoperatively and he agrees with this.  I will send him in some Norco for pain.  I would like to see him back in a week with 2 views of the right shoulder.  I like these to be internally externally rotated views of the right shoulder.  All question concerns were answered and addressed.  Follow-Up Instructions: Return in about 1 week (around 02/25/2019).   Orders:  No orders of the defined types were placed in this encounter.  No orders of the defined types were placed in this encounter.     Procedures: No procedures performed   Clinical Data: No additional findings.   Subjective: Chief Complaint  Patient presents with  . Right Shoulder - Injury, Pain  The patient is a 69 year old right-hand-dominant gentleman who is referred from the emergency room after having a mechanical fall injuring his right shoulder.  This happened this past Friday.  He sustained a proximal humerus fracture.  He is in a sling.  He does report significant pain.  He is someone who is on disability.  He does not do a lot overhead activities.  He is a diabetic that sounds like is not under great control.  He is also a pack-a-day smoker.  He is reporting significant right shoulder  pain.  He denies any numbness and tingling in his right hand.  He is hoping for nonoperative treatment.  HPI  Review of Systems He currently denies any headache, shortness of breath, fever, chills, nausea, vomiting he denies any chest pain  Objective: Vital Signs: There were no vitals taken for this visit.  Physical Exam He is alert and orient x3 and in no acute distress Ortho Exam Examination of his right upper extremity shows that he is in a sling.  His shoulder is well located clinically.  Distally his motor and sensory exam in his hand and wrist are normal. Specialty Comments:  No specialty comments available.  Imaging: No results found. X-rays of his right shoulder on the canopy system show a 3 to 4 part proximal humerus fracture.  The shoulder is well located.  PMFS History: Patient Active Problem List   Diagnosis Date Noted  . Pre-ulcerative calluses 04/20/2018  . 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 Diabetic foot ulcer associated with type 2 diabetes mellitus ([facility]) 07/07/2014  . Tobacco abuse 07/07/2014  . Infestation, maggots 07/07/2014  . Plantar ulcer of right foot ([facility]) 05/12/2014  . Pulse weakness   . Peripheral neuropathy   . Cellulitis of right lower extremity 05/09/2014  . Obesity 05/09/2014  . Cellulitis of  right lower leg 05/09/2014  . Hyperglycemia 03/21/2013  . IDDM (insulin dependent diabetes mellitus) 03/21/2013  . Noncompliance with medications due to cost issues 03/21/2013   Past Medical History:  Diagnosis Date  . Arthritis   . Cellulitis of right lower extremity 05/09/2014  . Diabetes mellitus without complication ([place])   . Diabetic foot ulcer ([place]) 07/07/2014   Status post bedside debridement of multiple right plantar ulcers by podiatrist, Dr. Laurena Spies.  . Hypercholesterolemia   . Maggot infestation    right foot ulcer  . Obesity 05/09/2014  . Tobacco abuse 07/07/2014    History reviewed. No pertinent family history.  Past Surgical History:  Procedure  Laterality Date  . APPENDECTOMY    . PERIPHERAL VASCULAR CATHETERIZATION N/A 07/22/2015   Procedure: Abdominal Aortogram w/Lower Extremity;  Surgeon: Elam Dutch, MD;  Location: [facility];  Service: Cardiovascular;  Laterality: N/A;   Social History   Occupational History  . Not on file  Tobacco Use  . Smoking status: Current Every Day Smoker    Packs/day: 1.50    Types: Cigarettes  . Smokeless tobacco: Never Used  Substance and Sexual Activity  . Alcohol use: Yes    Alcohol/week: 0.0 standard drinks    Comment: occ-twice a week beer approx 2  . Drug use: No  . Sexual activity: Not on file

## 2019-02-19 ENCOUNTER — Ambulatory Visit (HOSPITAL_COMMUNITY): Payer: Medicare HMO | Admitting: Physical Therapy

## 2019-02-19 ENCOUNTER — Other Ambulatory Visit: Payer: Self-pay

## 2019-02-19 DIAGNOSIS — R262 Difficulty in walking, not elsewhere classified: Secondary | ICD-10-CM | POA: Diagnosis not present

## 2019-02-19 DIAGNOSIS — S98131A Complete traumatic amputation of one right lesser toe, initial encounter: Secondary | ICD-10-CM

## 2019-02-19 DIAGNOSIS — S91301A Unspecified open wound, right foot, initial encounter: Secondary | ICD-10-CM

## 2019-02-19 NOTE — Progress Notes (Signed)
Subjective:  Patient ID: Mitchell [name], male    DOB: 05-Oct-1950,  MRN: [id number]  No chief complaint on file.   69 y.o. male returns for post-op check.  Patient is here for postop visit 5 date of surgery 12/15/2018.  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  Patient states is doing well.  Patient has been going into the wound care center to have the wound evaluated and treated.  Patient states the wound has almost closed up.  Overall he is very happy with the wound care center.  He also had a history of fall that resulted in breaking his arm.  He is going to see his orthopedic doctor for possible surgical evaluation.  However for now the wound on the right side has considerably improved.  He denies any other acute complaints.  He denies any other signs of infection.  Review of Systems: Negative except as noted in the HPI. Denies N/V/F/Ch.  Past Medical History:  Diagnosis Date  . Arthritis   . Cellulitis of right lower extremity 05/09/2014  . Diabetes mellitus without complication ([place])   . Diabetic foot ulcer ([place]) 07/07/2014   Status post bedside debridement of multiple right plantar ulcers by podiatrist, Dr. Laurena Spies.  . Hypercholesterolemia   . Maggot infestation    right foot ulcer  . Obesity 05/09/2014  . Tobacco abuse 07/07/2014    Current Outpatient Medications:  .  aspirin EC 81 MG tablet, Take 81 mg by mouth daily., Disp: , Rfl:  .  doxycycline (VIBRA-TABS) 100 MG tablet, Take 1 tablet (100 mg total) by mouth 2 (two) times daily. (Patient not taking: Reported on 02/13/2019), Disp: 28 tablet, Rfl: 0 .  furosemide (LASIX) 20 MG tablet, Take 1 tablet (20 mg total) by mouth daily., Disp: 30 tablet, Rfl: 0 .  gabapentin (NEURONTIN) 300 MG capsule, Take 1 capsule (300 mg total) by mouth 3 (three) times daily., Disp: 90 capsule, Rfl: 0 .  HYDROcodone-acetaminophen (NORCO/VICODIN) 5-325 MG tablet, Take 1-2 tablets by mouth every 6 (six)  hours as needed for moderate pain., Disp: 40 tablet, Rfl: 0 .  ibuprofen (ADVIL) 800 MG tablet, Take 1 tablet (800 mg total) by mouth every 6 (six) hours as needed. (Patient not taking: Reported on 02/13/2019), Disp: 60 tablet, Rfl: 1 .  JARDIANCE 25 MG TABS tablet, Take 25 mg by mouth daily. , Disp: , Rfl:  .  lovastatin (MEVACOR) 20 MG tablet, Take 1 tablet (20 mg total) by mouth at bedtime., Disp: 30 tablet, Rfl: 0 .  metFORMIN (GLUCOPHAGE) 500 MG tablet, Take 1,000 mg by mouth 2 (two) times daily., Disp: , Rfl:  .  NOVOLOG FLEXPEN 100 UNIT/ML FlexPen, Inject 15-30 Units into the skin See admin instructions. 15 units with breakfast 30 units with lunch 30 units with dinner, Disp: , Rfl:  .  TRESIBA FLEXTOUCH 200 UNIT/ML SOPN, Inject 55 Units into the skin daily., Disp: , Rfl:   Social History   Tobacco Use  Smoking Status Current Every Day Smoker  . Packs/day: 1.50  . Types: Cigarettes  Smokeless Tobacco Never Used    Allergies  Allergen Reactions  . Penicillins Itching and Rash    Has patient had a PCN reaction causing immediate rash, facial/tongue/throat swelling, SOB or lightheadedness with hypotension: Yes Has patient had a PCN reaction causing severe rash involving mucus membranes or skin necrosis: No Has patient had a PCN reaction that required hospitalization No Has patient had a PCN reaction  occurring within the last 10 years: No If all of the above answers are "NO", then may proceed with Cephalosporin use.    Objective:  There were no vitals filed for this visit. There is no height or weight on file to calculate BMI. Constitutional Well developed. Well nourished.  Vascular Foot warm and well perfused. Capillary refill normal to all digits.   Neurologic Normal speech. Oriented to person, place, and time. Epicritic sensation to light touch grossly present bilaterally.  Dermatologic  the depth of the wound is very superficial in nature.  Is primarily in the proximal  portion of the incision.  The rest of the wound has completely closed.  There is still periwound maceration present no clinical signs of infection noted.  Orthopedic: Tenderness to palpation noted about the surgical site.   Radiographs:None  Assessment:   No diagnosis found. Plan:  Patient was evaluated and treated and all questions answered.  S/p foot surgery right -Progressing as expected post-operatively. -XR: See above -WB Status: Weightbearing as tolerated in cam boot -Sutures: Removed.  Deep closure noted however superficial skin is very macerated and not completely closed. 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  I explained to the patient if this comes back as positive for infection he will end up losing all of his toes and I will need to perform a transmetatarsal amputation given that he has a history of a partial fifth metatarsal resection.  Patient agrees with this plan and will obtain an MRI as soon as possible. -Patient was not able to obtain an MRI because he underwent a fall that resulted in breaking his arm that may need an orthopedic evaluation and possible surgical management.  At this time patient would like to hold off on the MRI.  I explained to the patient that there may be infection in there that could get worse and if we do not get it evaluated.  However he does not feel comfortable getting an MRI at this time.  I believe given the greatly improvement in the wound and deep tissue closing over the wound itself I believe this may likely be stress fracture as opposed to osteomyelitic changes/pathologic fracture.  However once he is healed from right arm fall and if the wound  is still present I will consider getting a MRI at that time. -It appears the patient's wound care is being managed by [facility] and has been doing splendid job.  I I believe patient will benefit from management of the wound by reasonable wound care for now.  I will see the patient back in 3 weeks given that the wound is being managed.  I told the patient that if there is any signs of infection of the wound is regressing, see me sooner. -He has a incision wound measuring 0.3 x 0.3 with surrounding macerated tissue.  Aggressive Betadine wet-to-dry dressing was applied to the incision site.  I will leave the wound care recommendation to the wound care center that he is getting managed by.  No follow-ups on file.

## 2019-02-19 NOTE — Therapy (Signed)
[facility] [place], [place], [address] Phone: [phone]   Fax:  [phone]  Wound Care Therapy  Patient Details  Name: Mitchell [name] MRN: [id number] Date of Birth: March 26, 1950 Referring Provider (PT): [name]   Encounter Date: 02/19/2019  PT End of Session - 02/19/19 1725    Visit Number  13    Number of Visits  16    Date for PT Re-Evaluation  03/18/19    Authorization Type  [employer] Medicare    Authorization Time Period  POC dates 01/21/19 to 03/18/2019    Authorization - Visit Number  13    Authorization - Number of Visits  20    PT Start Time  1000    PT Stop Time  1045    PT Time Calculation (min)  45 min    Activity Tolerance  Patient tolerated treatment well;No increased pain    Behavior During Therapy  WFL for tasks assessed/performed       Past Medical History:  Diagnosis Date  . Arthritis   . Cellulitis of right lower extremity 05/09/2014  . Diabetes mellitus without complication ([place])   . Diabetic foot ulcer ([place]) 07/07/2014   Status post bedside debridement of multiple right plantar ulcers by podiatrist, Dr. Laurena Spies.  . Hypercholesterolemia   . Maggot infestation    right foot ulcer  . Obesity 05/09/2014  . Tobacco abuse 07/07/2014    Past Surgical History:  Procedure Laterality Date  . APPENDECTOMY    . PERIPHERAL VASCULAR CATHETERIZATION N/A 07/22/2015   Procedure: Abdominal Aortogram w/Lower Extremity;  Surgeon: Elam Dutch, MD;  Location: [facility];  Service: Cardiovascular;  Laterality: N/A;    There were no vitals filed for this visit.   Subjective Assessment - 02/19/19 1718    Subjective  Pt states his MD was very pleased with his progress.  STates he went to orhopedist for his shoulder and they are not doing surgery, just healing on it's own.  STates his shoulder is really sore.  No issues with his foot.    Currently in Pain?  No/denies                Wound Therapy -  02/19/19 1719    Subjective  no issues or pain with his foot.  MD was pleased.    Pain Scale  0-10    Pain Score  0-No pain    Wound Properties Date First Assessed: 02/06/19 Time First Assessed: 1235 Wound Type: Other (Comment) Location: Foot Location Orientation: Right;Anterior;Mid;Lateral Wound Description (Comments): wound near base of 3rd/4th met bases Present on Admission: No   Dressing Type  Silver hydrofiber    Dressing Changed  Changed    Dressing Status  Old drainage    Dressing Change Frequency  PRN    Site / Wound Assessment  Brown;Dusky    % Wound base Red or Granulating  90%    % Wound base Yellow/Fibrinous Exudate  10%    Peri-wound Assessment  Intact;Edema;Induration    Wound Length (cm)  0.2 cm   was 0.5   Wound Width (cm)  0.2 cm   was 0.6   Wound Depth (cm)  0.2 cm   was 0.4   Wound Volume (cm^3)  0.01 cm^3    Wound Surface Area (cm^2)  0.04 cm^2    Margins  Epibole (rolled edges)    Drainage Amount  Scant    Drainage Description  Serous  Treatment  Cleansed;Debridement (Selective)    Wound Properties Date First Assessed: 01/21/19 Time First Assessed: 0915 Wound Type: Diabetic ulcer;Incision - Dehisced Location: Foot Location Orientation: Right;Distal , at base of where third toe would be  Wound Description (Comments): incision site between second and forth toes on right foot Present on Admission: Yes   Dressing Type  Silver hydrofiber;Compression wrap    Dressing Changed  Changed    Dressing Status  Old drainage    Dressing Change Frequency  PRN    Site / Wound Assessment  Other (Comment)    % Wound base Red or Granulating  50%    % Wound base Yellow/Fibrinous Exudate  50%    Peri-wound Assessment  Maceration;Induration;Hemosiderin;Edema    Wound Length (cm)  1.8 cm   was 2.8   Wound Width (cm)  0.5 cm   was 0.8   Wound Depth (cm)  1 cm   was 2.6   Wound Volume (cm^3)  0.9 cm^3    Wound Surface Area (cm^2)  0.9 cm^2    Margins  Unattached edges  (unapproximated)    Drainage Amount  Minimal    Drainage Description  Serosanguineous    Treatment  Cleansed;Debridement (Selective)    Selective Debridement - Location  distal right foot between 2nd and 4th toes    Selective Debridement - Tools Used  Forceps;Scalpel    Selective Debridement - Tissue Removed  slough, devitalized tissue    Wound Therapy - Clinical Statement  most distal portion of wound opening is now closed with more proximal portion still remaining.  Overall much improved wiht reduced depth and width, minimal drainage.  Little odor remains.  dorsal foot wounds are nealrly healed with only scant driainage.  Overall improving niceley.    Wound Therapy - Functional Problem List  pain with walking, standing and sleeping    Factors Delaying/Impairing Wound Healing  Diabetes Mellitus;Vascular compromise;Multiple medical problems    Hydrotherapy Plan  Debridement;Pulsatile lavage with suction    Wound Therapy - Frequency  3X / week    Wound Therapy - Current Recommendations  PT    Wound Plan  continue with appropriate woundcare to promote wound closure.  FU if appt make for MRI    Dressing   silver hydrofiber packed into wound and layed over top, gauze, ABD, profore                PT Short Term Goals - 02/17/19 1537      PT SHORT TERM GOAL #1   Title  PT wound to be 50% granulated    Baseline  both wounds > 50%    Time  4    Period  Weeks    Status  Achieved    Target Date  02/18/19      PT SHORT TERM GOAL #2   Title  Patient will demonstrate at least 50% improvement in swelling in toes per visual inspection by PT    Baseline  Improved but still swollen    Time  4    Period  Weeks    Status  On-going    Target Date  02/18/19        PT Long Term Goals - 02/17/19 1537      PT LONG TERM GOAL #1   Title  Patient will report no more then 2/10 pain in his foot to improve QOL.    Baseline  current pan 0/10    Time  8    Period  Weeks    Status  Achieved       PT LONG TERM GOAL #2   Title  Wound will be 100% granulated    Baseline  one wound 50% one wound 85%    Time  8    Period  Weeks    Status  On-going              Patient will benefit from skilled therapeutic intervention in order to improve the following deficits and impairments:     Visit Diagnosis: Difficulty in walking, not elsewhere classified  Open wound of right foot, initial encounter  Amputation of toe of right foot [facility])     Problem List Patient Active Problem List   Diagnosis Date Noted  . Pre-ulcerative calluses 04/20/2018  . 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 Diabetic foot ulcer associated with type 2 diabetes mellitus ([place]) 07/07/2014  . Tobacco abuse 07/07/2014  . Infestation, maggots 07/07/2014  . Plantar ulcer of right foot ([place]) 05/12/2014  . Pulse weakness   . Peripheral neuropathy   . Cellulitis of right lower extremity 05/09/2014  . Obesity 05/09/2014  . Cellulitis of right lower leg 05/09/2014  . Hyperglycemia 03/21/2013  . IDDM (insulin dependent diabetes mellitus) 03/21/2013  . Noncompliance with medications due to cost issues 03/21/2013   Teena Irani, PTA/CLT [phone]  Teena Irani 02/19/2019, 5:25 PM  [facility] [address] [place], [place], [address] Phone: [phone]   Fax:  [phone]  Name: Mitchell [name] MRN: [id number] Date of Birth: Sep 28, 1950

## 2019-02-20 ENCOUNTER — Encounter: Payer: Self-pay | Admitting: Podiatry

## 2019-02-24 ENCOUNTER — Ambulatory Visit (HOSPITAL_COMMUNITY): Payer: Medicare HMO | Admitting: Physical Therapy

## 2019-02-24 ENCOUNTER — Other Ambulatory Visit: Payer: Self-pay

## 2019-02-24 DIAGNOSIS — S91301A Unspecified open wound, right foot, initial encounter: Secondary | ICD-10-CM

## 2019-02-24 DIAGNOSIS — R262 Difficulty in walking, not elsewhere classified: Secondary | ICD-10-CM | POA: Diagnosis not present

## 2019-02-24 DIAGNOSIS — S98131A Complete traumatic amputation of one right lesser toe, initial encounter: Secondary | ICD-10-CM

## 2019-02-24 NOTE — Therapy (Addendum)
[facility] [place], [place], [address] Phone: [phone]   Fax:  [phone]  Wound Care Therapy  Patient Details  Name: Mitchell [name] MRN: [id number] Date of Birth: [birth year] Referring Provider (PT): [name]   Encounter Date: 02/24/2019  PT End of Session - 02/24/19 1425    Visit Number  14    Number of Visits  16    Date for PT Re-Evaluation  03/18/19    Authorization Type  [employer]na Medicare    Authorization Time Period  POC dates 01/21/19 to 03/18/2019    Authorization - Visit Number  14    Authorization - Number of Visits  20    PT Start Time  0925    PT Stop Time  1000    PT Time Calculation (min)  35 min    Activity Tolerance  Patient tolerated treatment well;No increased pain    Behavior During Therapy  WFL for tasks assessed/performed       Past Medical History:  Diagnosis Date  . Arthritis   . Cellulitis of right lower extremity 05/09/2014  . Diabetes mellitus without complication ([place])   . Diabetic foot ulcer ([place]) 07/07/2014   Status post bedside debridement of multiple right plantar ulcers by podiatrist, Dr. Laurena Spies.  . Hypercholesterolemia   . Maggot infestation    right foot ulcer  . Obesity 05/09/2014  . Tobacco abuse 07/07/2014    Past Surgical History:  Procedure Laterality Date  . APPENDECTOMY    . PERIPHERAL VASCULAR CATHETERIZATION N/A 07/22/2015   Procedure: Abdominal Aortogram w/Lower Extremity;  Surgeon: Elam Dutch, MD;  Location: [facility];  Service: Cardiovascular;  Laterality: N/A;    There were no vitals filed for this visit.              Wound Therapy - 02/24/19 1701    Subjective  pt states he is doing great today with his foot.  STates his shoulder continues to bother him.     Pain Scale  0-10    Pain Score  0-No pain    Wound Properties Date First Assessed: 02/06/19 Time First Assessed: 1235 Wound Type: Other (Comment) Location: Foot Location Orientation:  Right;Anterior;Mid;Lateral Wound Description (Comments): wound near base of 3rd/4th met bases Present on Admission: No Final Assessment Date: 02/24/19 Final Assessment Time: 0930   Wound Properties Date First Assessed: 01/21/19 Time First Assessed: 0915 Wound Type: Diabetic ulcer;Incision - Dehisced Location: Foot Location Orientation: Right;Distal , at base of where third toe would be  Wound Description (Comments): incision site between second and forth toes on right foot Present on Admission: Yes   Dressing Type  Silver hydrofiber;Compression wrap    Dressing Changed  Changed    Dressing Status  Old drainage    Dressing Change Frequency  PRN    Site / Wound Assessment  Other (Comment)    % Wound base Red or Granulating  80%    % Wound base Yellow/Fibrinous Exudate  20%    Peri-wound Assessment  Maceration;Induration;Hemosiderin;Edema    Margins  Unattached edges (unapproximated)    Drainage Amount  Minimal    Drainage Description  Serosanguineous    Treatment  Cleansed;Debridement (Selective)    Selective Debridement - Location  distal right foot between 2nd and 4th toes    Selective Debridement - Tools Used  Forceps;Scalpel    Selective Debridement - Tissue Removed  slough, devitalized tissue    Wound Therapy - Clinical Statement  Edges of wound is flattening along surface of skin with less epibole edges.  Ovearll improving with reduced size, drainage and no redness present.  Cleansed LE well including between toes/crevices.  moisturized LE well.  Small wounds on top of foot now 100% healed.  One raw area from stagnant drainage on plantar suface will need to keep observing and cleanse as able.      Wound Therapy - Functional Problem List  pain with walking, standing and sleeping    Factors Delaying/Impairing Wound Healing  Diabetes Mellitus;Vascular compromise;Multiple medical problems    Hydrotherapy Plan  Debridement;Pulsatile lavage with suction    Wound Therapy - Frequency  3X / week     Wound Therapy - Current Recommendations  PT    Wound Plan  continue with appropriate woundcare to promote wound closure.  FU if appt make for MRI    Dressing   silver hydrofiber packed into wound and layed over top, gauze, ABD, profore                PT Short Term Goals - 02/17/19 1537      PT SHORT TERM GOAL #1   Title  PT wound to be 50% granulated    Baseline  both wounds > 50%    Time  4    Period  Weeks    Status  Achieved    Target Date  02/18/19      PT SHORT TERM GOAL #2   Title  Patient will demonstrate at least 50% improvement in swelling in toes per visual inspection by PT    Baseline  Improved but still swollen    Time  4    Period  Weeks    Status  On-going    Target Date  02/18/19        PT Long Term Goals - 02/17/19 1537      PT LONG TERM GOAL #1   Title  Patient will report no more then 2/10 pain in his foot to improve QOL.    Baseline  current pan 0/10    Time  8    Period  Weeks    Status  Achieved      PT LONG TERM GOAL #2   Title  Wound will be 100% granulated    Baseline  one wound 50% one wound 85%    Time  8    Period  Weeks    Status  On-going              Patient will benefit from skilled therapeutic intervention in order to improve the following deficits and impairments:     Visit Diagnosis: Amputation of toe of right foot ([place])  Open wound of right foot, initial encounter  Difficulty in walking, not elsewhere classified     Problem List Patient Active Problem List   Diagnosis Date Noted  . Pre-ulcerative calluses 04/20/2018  . 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 Diabetic foot ulcer associated with type 2 diabetes mellitus ([place]) 07/07/2014  . Tobacco abuse 07/07/2014  . Infestation, maggots 07/07/2014  . Plantar ulcer of right foot ([place]) 05/12/2014  . Pulse weakness   . Peripheral neuropathy   . Cellulitis of right lower extremity 05/09/2014  . Obesity 05/09/2014  . Cellulitis of right lower leg 05/09/2014  .  Hyperglycemia 03/21/2013  . IDDM (insulin dependent diabetes mellitus) 03/21/2013  . Noncompliance with medications due to cost issues 03/21/2013   Teena Irani, PTA/CLT [phone]  Roseanne Reno B 02/24/2019,  [facility] [address] [place], [place], [address] Phone: [phone]   Fax:  [phone]  Name: Mitchell [name] MRN: [id number] Date of Birth: [birth year]

## 2019-02-25 ENCOUNTER — Ambulatory Visit (INDEPENDENT_AMBULATORY_CARE_PROVIDER_SITE_OTHER): Payer: Medicare HMO

## 2019-02-25 ENCOUNTER — Ambulatory Visit: Payer: Medicare HMO | Admitting: Orthopaedic Surgery

## 2019-02-25 ENCOUNTER — Other Ambulatory Visit: Payer: Self-pay

## 2019-02-25 ENCOUNTER — Encounter: Payer: Self-pay | Admitting: Orthopaedic Surgery

## 2019-02-25 DIAGNOSIS — S42291D Other displaced fracture of upper end of right humerus, subsequent encounter for fracture with routine healing: Secondary | ICD-10-CM

## 2019-02-25 DIAGNOSIS — S42201D Unspecified fracture of upper end of right humerus, subsequent encounter for fracture with routine healing: Secondary | ICD-10-CM | POA: Insufficient documentation

## 2019-02-25 NOTE — Progress Notes (Signed)
Office Visit Note   Patient: Mitchell [name]           Date of Birth: [birth year]           MRN: [id number] Visit Date: 02/25/2019              Requested by: Monico Blitz, MD [address] [place],  [place] [address] PCP: Monico Blitz, MD   Assessment & Plan: Visit Diagnoses:  1. Other closed displaced fracture of proximal end of right humerus with routine healing, subsequent encounter     Plan: His x-rays show that he has a susceptible alignment given his age and comorbidities.  We are going to have him continue the sling.  We will see him back in 4 weeks with 3 views of the right shoulder.  If he runs on pain medication he will let us know.  All question concerns were answered and addressed.  He agrees with this treating this nonoperative due to all of the issues that have been mentioned.  Follow-Up Instructions: Return in about 4 weeks (around 03/25/2019).   Orders:  Orders Placed This Encounter  Procedures  . XR Shoulder Right   No orders of the defined types were placed in this encounter.     Procedures: No procedures performed   Clinical Data: No additional findings.   Subjective: Chief Complaint  Patient presents with  . Right Shoulder - Follow-up  The patient is seen a week after we saw him in the office with a complex proximal humerus fracture of the right shoulder.  Were treating this nonoperatively due to his multiple medical problems and the fact that he has been dealing with an infection in his foot.  He is comfortable in the sling.  We want to see him today just to make sure the fracture was not worsening in any way.  He is having trouble sleeping and we have had him on some pain medication.  He is try to work on good diabetic control as well.  HPI  Review of Systems He currently denies any shortness of breath, fever, chills, nausea, vomiting; he denies any numbness and tingling in his right hand.  Objective: Vital Signs: There were no vitals taken for  this visit.  Physical Exam He is alert and orient x3 and in no acute distress Ortho Exam Examination of his right shoulder says is clinically located but certainly painful with attempts of motion.  His hand is well-perfused on the right side and he can move his fingers and thumb easily. Specialty Comments:  No specialty comments available.  Imaging: XR Shoulder Right  Result Date: 02/25/2019 Internal and external rotation views of the right shoulder show a proximal humerus fracture that is at least a 3 part fracture.  There is a greater tuberosity component that is slightly displaced but not superiorly displaced.    PMFS History: Patient Active Problem List   Diagnosis Date Noted  . Closed fracture of proximal end of right humerus with routine healing 02/25/2019  . Pre-ulcerative calluses 04/20/2018  . 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 Diabetic foot ulcer associated with type 2 diabetes mellitus ([place]) 07/07/2014  . Tobacco abuse 07/07/2014  . Infestation, maggots 07/07/2014  . Plantar ulcer of right foot ([place]) 05/12/2014  . Pulse weakness   . Peripheral neuropathy   . Cellulitis of right lower extremity 05/09/2014  . Obesity 05/09/2014  . Cellulitis of right lower leg 05/09/2014  . Hyperglycemia 03/21/2013  . IDDM (insulin  dependent diabetes mellitus) 03/21/2013  . Noncompliance with medications due to cost issues 03/21/2013   Past Medical History:  Diagnosis Date  . Arthritis   . Cellulitis of right lower extremity 05/09/2014  . Diabetes mellitus without complication (HCC)   . Diabetic foot ulcer (HCC) 07/07/2014   Status post bedside debridement of multiple right plantar ulcers by podiatrist, Dr. Reynolds Bowl.  . Hypercholesterolemia   . Maggot infestation    right foot ulcer  . Obesity 05/09/2014  . Tobacco abuse 07/07/2014    History reviewed. No pertinent family history.  Past Surgical History:  Procedure Laterality Date  . APPENDECTOMY    . PERIPHERAL VASCULAR CATHETERIZATION N/A  07/22/2015   Procedure: Abdominal Aortogram w/Lower Extremity;  Surgeon: Sherren Kerns, MD;  Location: [facility] INVASIVE CV LAB;  Service: Cardiovascular;  Laterality: N/A;   Social History   Occupational History  . Not on file  Tobacco Use  . Smoking status: Current Every Day Smoker    Packs/day: 1.50    Types: Cigarettes  . Smokeless tobacco: Never Used  Substance and Sexual Activity  . Alcohol use: Yes    Alcohol/week: 0.0 standard drinks    Comment: occ-twice a week beer approx 2  . Drug use: No  . Sexual activity: Not on file

## 2019-02-27 ENCOUNTER — Ambulatory Visit (HOSPITAL_COMMUNITY): Payer: Medicare HMO | Admitting: Physical Therapy

## 2019-02-27 ENCOUNTER — Encounter (HOSPITAL_COMMUNITY): Payer: Self-pay | Admitting: Physical Therapy

## 2019-02-27 ENCOUNTER — Other Ambulatory Visit: Payer: Self-pay

## 2019-02-27 DIAGNOSIS — R262 Difficulty in walking, not elsewhere classified: Secondary | ICD-10-CM | POA: Diagnosis not present

## 2019-02-27 DIAGNOSIS — S98131A Complete traumatic amputation of one right lesser toe, initial encounter: Secondary | ICD-10-CM

## 2019-02-27 DIAGNOSIS — S91301A Unspecified open wound, right foot, initial encounter: Secondary | ICD-10-CM

## 2019-02-27 NOTE — Therapy (Signed)
[facility] [address] [place], [place], [address] Phone: [phone]   Fax:  [phone]  Wound Care Therapy  Patient Details  Name: Mitchell [name] MRN: [id number] Date of Birth: Aug 19, 1950 Referring Provider (PT): Nicholes Rough   Encounter Date: 02/27/2019  PT End of Session - 02/27/19 1229    Visit Number  15    Number of Visits  16    Date for PT Re-Evaluation  03/18/19    Authorization Type  [employer] Medicare    Authorization Time Period  POC dates 01/21/19 to 03/18/2019    Authorization - Visit Number  15    Authorization - Number of Visits  20    PT Start Time  1000    PT Stop Time  1050    PT Time Calculation (min)  50 min    Activity Tolerance  Patient tolerated treatment well;No increased pain    Behavior During Therapy  WFL for tasks assessed/performed       Past Medical History:  Diagnosis Date  . Arthritis   . Cellulitis of right lower extremity 05/09/2014  . Diabetes mellitus without complication (HCC)   . Diabetic foot ulcer (HCC) 07/07/2014   Status post bedside debridement of multiple right plantar ulcers by podiatrist, Dr. Reynolds Bowl.  . Hypercholesterolemia   . Maggot infestation    right foot ulcer  . Obesity 05/09/2014  . Tobacco abuse 07/07/2014    Past Surgical History:  Procedure Laterality Date  . APPENDECTOMY    . PERIPHERAL VASCULAR CATHETERIZATION N/A 07/22/2015   Procedure: Abdominal Aortogram w/Lower Extremity;  Surgeon: Sherren Kerns, MD;  Location: [facility] INVASIVE CV LAB;  Service: Cardiovascular;  Laterality: N/A;    There were no vitals filed for this visit.     [facility] PT Assessment - 02/27/19 0001      Assessment   Medical Diagnosis  diabetic ulcer of toe of right foot     Referring Provider (PT)  Nicholes Rough    Onset Date/Surgical Date  12/15/18              Wound Therapy - 02/27/19 1231    Subjective  Patient reports his foot is not really bothering him, his right shoulder is more of a pain  though. He saw the MD and they want it to heal without surgery, currently in sling and is to keep it immobilized.     Patient and Family Stated Goals  for incision/wound to heal    Date of Onset  12/15/18    Prior Treatments  betadine, wet to dry dressings, amputation of thrid toe on R foot    Pain Scale  0-10    Pain Score  0-No pain    Wound Properties Date First Assessed: 01/21/19 Time First Assessed: 0915 Wound Type: Diabetic ulcer;Incision - Dehisced Location: Foot Location Orientation: Right;Distal , at base of where third toe would be  Wound Description (Comments): incision site between second and forth toes on right foot Present on Admission: Yes   Dressing Type  Silver hydrofiber;Compression wrap;Impregnated gauze (bismuth);Alginate    Dressing Changed  Changed    Dressing Status  Old drainage    Dressing Change Frequency  PRN    Site / Wound Assessment  Granulation tissue;Pale;Pink    % Wound base Red or Granulating  80%    % Wound base Yellow/Fibrinous Exudate  20%    Peri-wound Assessment  Induration;Hemosiderin;Edema    Margins  Unattached edges (unapproximated)  Drainage Amount  Minimal    Drainage Description  Serosanguineous    Treatment  Cleansed;Debridement (Selective)    Selective Debridement - Location  distal right foot between 2nd and 4th toes    Selective Debridement - Tools Used  Forceps;Scalpel    Selective Debridement - Tissue Removed  slough, devitalized tissue    Wound Therapy - Clinical Statement  Wound bed improving in granulation tissue and patient having less drainage and less odor. Small opening on plantar aspect of foot which seeps pus-like substance with pressure and massage to area. Massaged out all pus-like substance today and put xeroform over opening to promote wound healing. Continued with silver hydrofiber, vaseoline and profore on this date. Patient would continue to benefit from skilled physical therapy to promote optimal wound healing.     Wound  Therapy - Functional Problem List  pain with walking, standing and sleeping    Factors Delaying/Impairing Wound Healing  Diabetes Mellitus;Vascular compromise;Multiple medical problems    Hydrotherapy Plan  Debridement;Pulsatile lavage with suction    Wound Therapy - Frequency  3X / week    Wound Therapy - Current Recommendations  PT    Wound Plan  continue with appropriate woundcare to promote wound closure.  FU if appt make for MRI    Dressing   xeroform and then alginate on bottom of foot. silver hydrofiber packed into wound and layed over top, gauze, ABD, profore                PT Short Term Goals - 02/17/19 1537      PT SHORT TERM GOAL #1   Title  PT wound to be 50% granulated    Baseline  both wounds > 50%    Time  4    Period  Weeks    Status  Achieved    Target Date  02/18/19      PT SHORT TERM GOAL #2   Title  Patient will demonstrate at least 50% improvement in swelling in toes per visual inspection by PT    Baseline  Improved but still swollen    Time  4    Period  Weeks    Status  On-going    Target Date  02/18/19        PT Long Term Goals - 02/17/19 1537      PT LONG TERM GOAL #1   Title  Patient will report no more then 2/10 pain in his foot to improve QOL.    Baseline  current pan 0/10    Time  8    Period  Weeks    Status  Achieved      PT LONG TERM GOAL #2   Title  Wound will be 100% granulated    Baseline  one wound 50% one wound 85%    Time  8    Period  Weeks    Status  On-going            Plan - 02/27/19 1229    Clinical Impression Statement  see above    Personal Factors and Comorbidities  Age;Comorbidity 1;Comorbidity 2;Fitness    Examination-Activity Limitations  Bathing;Dressing;Hygiene/Grooming;Locomotion Level;Stand;Stairs;Squat;Sleep;Transfers    Examination-Participation Restrictions  Cleaning;Driving;Community Activity    Stability/Clinical Decision Making  Evolving/Moderate complexity    Rehab Potential  Good    PT  Frequency  2x / week    PT Duration  8 weeks    PT Treatment/Interventions  Other (comment);Compression bandaging;Scar mobilization;Patient/family education;Manual techniques   wound  debridment, pulse lavage   PT Next Visit Plan  continue with POC; include plantar aspect opening in PN to MD    Consulted and Agree with Plan of Care  Patient       Patient will benefit from skilled therapeutic intervention in order to improve the following deficits and impairments:  Abnormal gait, Decreased activity tolerance, Decreased balance, Decreased endurance, Decreased knowledge of precautions, Decreased mobility, Decreased range of motion, Difficulty walking, Pain, Other (comment), Increased edema, Impaired sensation, Decreased skin integrity(wound)  Visit Diagnosis: Amputation of toe of right foot (HCC)  Open wound of right foot, initial encounter  Difficulty in walking, not elsewhere classified     Problem List Patient Active Problem List   Diagnosis Date Noted  . Closed fracture of proximal end of right humerus with routine healing 02/25/2019  . Pre-ulcerative calluses 04/20/2018  . 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 Diabetic foot ulcer associated with type 2 diabetes mellitus ([place]) 07/07/2014  . Tobacco abuse 07/07/2014  . Infestation, maggots 07/07/2014  . Plantar ulcer of right foot ([place]) 05/12/2014  . Pulse weakness   . Peripheral neuropathy   . Cellulitis of right lower extremity 05/09/2014  . Obesity 05/09/2014  . Cellulitis of right lower leg 05/09/2014  . Hyperglycemia 03/21/2013  . IDDM (insulin dependent diabetes mellitus) 03/21/2013  . Noncompliance with medications due to cost issues 03/21/2013    12:43 PM, 02/27/19 Jerene Pitch, DPT Physical Therapy with [facility]  [phone] office  [place] [address] [place], [place], [address] Phone: [phone]   Fax:  [phone]  Name: Mitchell [name] MRN:  [id number] Date of Birth: 08-16-1950

## 2019-03-02 ENCOUNTER — Encounter (HOSPITAL_COMMUNITY): Payer: Self-pay | Admitting: Physical Therapy

## 2019-03-02 ENCOUNTER — Ambulatory Visit (HOSPITAL_COMMUNITY): Payer: Medicare HMO | Admitting: Physical Therapy

## 2019-03-02 ENCOUNTER — Other Ambulatory Visit: Payer: Self-pay

## 2019-03-02 DIAGNOSIS — S98131A Complete traumatic amputation of one right lesser toe, initial encounter: Secondary | ICD-10-CM

## 2019-03-02 DIAGNOSIS — R262 Difficulty in walking, not elsewhere classified: Secondary | ICD-10-CM | POA: Diagnosis not present

## 2019-03-02 DIAGNOSIS — S91301A Unspecified open wound, right foot, initial encounter: Secondary | ICD-10-CM

## 2019-03-02 NOTE — Therapy (Signed)
[facility] [address] [place], [place], [address] Phone: [phone]   Fax:  [phone]  Wound Care Therapy, Progress note and RECERT   Patient Details  Name: Mitchell [name] MRN: [id number] Date of Birth: [birth year] Referring Provider (PT): Nicholes Rough  Progress Note Reporting Period 02/11/19  to 03/02/19  See note below for Objective Data and Assessment of Progress/Goals.        Encounter Date: 03/02/2019  PT End of Session - 03/02/19 1417    Visit Number  16    Number of Visits  26    Date for PT Re-Evaluation  04/06/19    Authorization Type  [employer]na Medicare    Authorization Time Period  POC dates 01/21/19 to 03/18/2019; NEW POC DATES 03/02/19 to 04/06/2019, RECERT and PN performed on visit 16 - due on visit 26    Authorization - Visit Number  16    Authorization - Number of Visits  20    PT Start Time  1315    PT Stop Time  1410    PT Time Calculation (min)  55 min    Activity Tolerance  Patient tolerated treatment well;No increased pain    Behavior During Therapy  WFL for tasks assessed/performed       Past Medical History:  Diagnosis Date  . Arthritis   . Cellulitis of right lower extremity 05/09/2014  . Diabetes mellitus without complication (HCC)   . Diabetic foot ulcer (HCC) 07/07/2014   Status post bedside debridement of multiple right plantar ulcers by podiatrist, Dr. Reynolds Bowl.  . Hypercholesterolemia   . Maggot infestation    right foot ulcer  . Obesity 05/09/2014  . Tobacco abuse 07/07/2014    Past Surgical History:  Procedure Laterality Date  . APPENDECTOMY    . PERIPHERAL VASCULAR CATHETERIZATION N/A 07/22/2015   Procedure: Abdominal Aortogram w/Lower Extremity;  Surgeon: Sherren Kerns, MD;  Location: [facility] INVASIVE CV LAB;  Service: Cardiovascular;  Laterality: N/A;    There were no vitals filed for this visit.     [facility] PT Assessment - 03/02/19 0001      Assessment   Medical Diagnosis  diabetic ulcer of toe of  right foot     Referring Provider (PT)  Nicholes Rough    Onset Date/Surgical Date  12/15/18              Wound Therapy - 03/02/19 1421    Subjective  Patient reports no pain an incision site. States that he does have some mild discomfort on his second toe and thinks it's coming from the cold as he goes outside to smoke his cigarettes and since his toe is not wrapped it gets cold and painful. States he has a difficult time getting comfortable with his right shoulder fracture and can't really elevate his right leg. States the bandage started slipping down and irritating his upper leg (really itchy) on Sunday.     Patient and Family Stated Goals  for incision/wound to heal    Date of Onset  12/15/18    Prior Treatments  betadine, wet to dry dressings, amputation of thrid toe on R foot    Pain Scale  0-10    Pain Score  0-No pain    Wound Properties Date First Assessed: 01/21/19 Time First Assessed: 0915 Wound Type: Diabetic ulcer;Incision - Dehisced Location: Foot Location Orientation: Right;Distal , at base of where third toe would be  Wound Description (Comments): incision site between second and forth  toes on right foot Present on Admission: Yes   Dressing Type  Abdominal pads;Compression wrap;Impregnated gauze (bismuth);Gauze (Comment);Silver hydrofiber;Alginate    Dressing Changed  Changed    Dressing Status  Old drainage    Dressing Change Frequency  PRN    Site / Wound Assessment  Granulation tissue;Pale;Pink    % Wound base Red or Granulating  90%    % Wound base Yellow/Fibrinous Exudate  10%    Peri-wound Assessment  Induration;Hemosiderin;Edema    Wound Length (cm)  1.6 cm   was 1.8   Wound Width (cm)  0.5 cm   was .5   Wound Depth (cm)  0.5 cm   was 1.0   Wound Volume (cm^3)  0.4 cm^3    Wound Surface Area (cm^2)  0.8 cm^2    Margins  Unattached edges (unapproximated)    Drainage Amount  Minimal    Drainage Description  Serosanguineous    Treatment  Cleansed;Debridement  (Selective)    Selective Debridement - Location  distal right foot between 2nd and 4th toes    Selective Debridement - Tools Used  Forceps;Scalpel;Scissors    Selective Debridement - Tissue Removed  slough, devitalized tissue    Wound Therapy - Clinical Statement  Patient present for a progress note on this date. Wound is healing very nicely with two spots on top of foot completely healed. Amputation site/incision is starting to approximate with less and less drainage noted. Macerated tissue noted between pinky and foot, added alginate between all toes prior to wrapping to promote dry environment. Pus seeped out of small opening on plantar aspect of foot last session, with no pus or drainage noted on this session. Just a callus with some hemo-staining present on plantar aspect with small opening. Measured callus and it measured 2cm in length and .5 cm in width. Minor concern that this area is a deeper injury then is presenting. Discussed with patient about obtaining imaging to rule out deeper infection. Continued with massage for edema, profore, silver hydrofiber and then xeroform to bottom of the foot with alginate. Extending POC additional 5 weeks at 2x/week to continue to promote optimal wound healing.     Wound Therapy - Functional Problem List  pain with walking, standing and sleeping    Factors Delaying/Impairing Wound Healing  Diabetes Mellitus;Vascular compromise;Multiple medical problems    Hydrotherapy Plan  Debridement;Pulsatile lavage with suction    Wound Therapy - Frequency  2X / week    Wound Therapy - Current Recommendations  PT    Wound Plan  continue with appropriate woundcare to promote wound closure.  FU if appt make for MRI    Dressing   xeroform and then alginate on bottom of foot. silver hydrofiber packed into wound bed and laid over top, gauze, ABD, profore and alginate between toes. Extra cotton and abd pad around leg to keep bandage from sliding down.                  PT Short Term Goals - 02/17/19 1537      PT SHORT TERM GOAL #1   Title  PT wound to be 50% granulated    Baseline  both wounds > 50%    Time  4    Period  Weeks    Status  Achieved    Target Date  02/18/19      PT SHORT TERM GOAL #2   Title  Patient will demonstrate at least 50% improvement in swelling in toes per  visual inspection by PT    Baseline  Improved but still swollen    Time  4    Period  Weeks    Status  On-going    Target Date  02/18/19        PT Long Term Goals - 02/17/19 1537      PT LONG TERM GOAL #1   Title  Patient will report no more then 2/10 pain in his foot to improve QOL.    Baseline  current pan 0/10    Time  8    Period  Weeks    Status  Achieved      PT LONG TERM GOAL #2   Title  Wound will be 100% granulated    Baseline  one wound 50% one wound 85%    Time  8    Period  Weeks    Status  On-going            Plan - 03/02/19 1420    Clinical Impression Statement  see above    Personal Factors and Comorbidities  Age;Comorbidity 1;Comorbidity 2;Fitness    Examination-Activity Limitations  Bathing;Dressing;Hygiene/Grooming;Locomotion Level;Stand;Stairs;Squat;Sleep;Transfers    Examination-Participation Restrictions  Cleaning;Driving;Community Activity    Stability/Clinical Decision Making  Evolving/Moderate complexity    Rehab Potential  Good    PT Frequency  2x / week    PT Duration  --   5 weeks   PT Treatment/Interventions  Other (comment);Compression bandaging;Scar mobilization;Patient/family education;Manual techniques   wound debridment, pulse lavage   PT Next Visit Plan  continue with POC; include plantar aspect opening in PN to MD    Consulted and Agree with Plan of Care  Patient       Patient will benefit from skilled therapeutic intervention in order to improve the following deficits and impairments:  Abnormal gait, Decreased activity tolerance, Decreased balance, Decreased endurance, Decreased  knowledge of precautions, Decreased mobility, Decreased range of motion, Difficulty walking, Pain, Other (comment), Increased edema, Impaired sensation, Decreased skin integrity(wound)  Visit Diagnosis: Amputation of toe of right foot (HCC)  Open wound of right foot, initial encounter  Difficulty in walking, not elsewhere classified     Problem List Patient Active Problem List   Diagnosis Date Noted  . Closed fracture of proximal end of right humerus with routine healing 02/25/2019  . Pre-ulcerative calluses 04/20/2018  . 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 Diabetic foot ulcer associated with type 2 diabetes mellitus ([place]) 07/07/2014  . Tobacco abuse 07/07/2014  . Infestation, maggots 07/07/2014  . Plantar ulcer of right foot ([place]) 05/12/2014  . Pulse weakness   . Peripheral neuropathy   . Cellulitis of right lower extremity 05/09/2014  . Obesity 05/09/2014  . Cellulitis of right lower leg 05/09/2014  . Hyperglycemia 03/21/2013  . IDDM (insulin dependent diabetes mellitus) 03/21/2013  . Noncompliance with medications due to cost issues 03/21/2013   3:03 PM, 03/02/19 Jerene Pitch, DPT Physical Therapy with [facility]  [phone] office  [place], [place], [address] Phone: [phone]   Fax:  [phone]  Name: Mitchell [name] MRN: [id number] Date of Birth: 04/06/1950

## 2019-03-05 ENCOUNTER — Ambulatory Visit (HOSPITAL_COMMUNITY): Payer: Medicare HMO | Admitting: Physical Therapy

## 2019-03-06 ENCOUNTER — Encounter (HOSPITAL_COMMUNITY): Payer: Self-pay | Admitting: Physical Therapy

## 2019-03-06 ENCOUNTER — Ambulatory Visit (HOSPITAL_COMMUNITY): Payer: Medicare HMO | Admitting: Physical Therapy

## 2019-03-06 ENCOUNTER — Other Ambulatory Visit: Payer: Self-pay

## 2019-03-06 DIAGNOSIS — S91301A Unspecified open wound, right foot, initial encounter: Secondary | ICD-10-CM

## 2019-03-06 DIAGNOSIS — R262 Difficulty in walking, not elsewhere classified: Secondary | ICD-10-CM | POA: Diagnosis not present

## 2019-03-06 DIAGNOSIS — S98131A Complete traumatic amputation of one right lesser toe, initial encounter: Secondary | ICD-10-CM | POA: Diagnosis not present

## 2019-03-06 NOTE — Therapy (Signed)
[facility] [address] [place], [place], [address] Phone: [phone]   Fax:  [phone]  Wound Care Therapy  Patient Details  Name: Mitchell [name] MRN: [id number] Date of Birth: 18-Jul-1950 Referring Provider (PT): Nicholes Rough   Encounter Date: 03/06/2019  PT End of Session - 03/06/19 1443    Visit Number  17    Number of Visits  26    Date for PT Re-Evaluation  04/06/19    Authorization Type  [employer] Medicare    Authorization Time Period  POC dates 01/21/19 to 03/18/2019; NEW POC DATES 03/02/19 to 04/06/2019, RECERT and PN performed on visit 16 - due on visit 26    Authorization - Visit Number  1    Authorization - Number of Visits  10    PT Start Time  1310    PT Stop Time  1410    PT Time Calculation (min)  60 min    Activity Tolerance  Patient tolerated treatment well;No increased pain    Behavior During Therapy  WFL for tasks assessed/performed       Past Medical History:  Diagnosis Date  . Arthritis   . Cellulitis of right lower extremity 05/09/2014  . Diabetes mellitus without complication (HCC)   . Diabetic foot ulcer (HCC) 07/07/2014   Status post bedside debridement of multiple right plantar ulcers by podiatrist, Dr. Reynolds Bowl.  . Hypercholesterolemia   . Maggot infestation    right foot ulcer  . Obesity 05/09/2014  . Tobacco abuse 07/07/2014    Past Surgical History:  Procedure Laterality Date  . APPENDECTOMY    . PERIPHERAL VASCULAR CATHETERIZATION N/A 07/22/2015   Procedure: Abdominal Aortogram w/Lower Extremity;  Surgeon: Sherren Kerns, MD;  Location: [facility] INVASIVE CV LAB;  Service: Cardiovascular;  Laterality: N/A;    There were no vitals filed for this visit.     [facility] PT Assessment - 03/06/19 0001      Assessment   Medical Diagnosis  diabetic ulcer of toe of right foot     Referring Provider (PT)  Nicholes Rough    Onset Date/Surgical Date  12/15/18              Wound Therapy - 03/06/19 1426    Subjective   Patient reports he has been really uncomfortable, really itchy at the top of his leg. States the bandage slide down again. States that he feels like he is bleeding or something with how uncomfortable the upper part of the leg as been.     Patient and Family Stated Goals  for incision/wound to heal    Date of Onset  12/15/18    Prior Treatments  betadine, wet to dry dressings, amputation of thrid toe on R foot    Pain Scale  0-10    Pain Score  0-No pain    Wound Properties Date First Assessed: 01/21/19 Time First Assessed: 0915 Wound Type: Diabetic ulcer;Incision - Dehisced Location: Foot Location Orientation: Right;Distal , at base of where third toe would be  Wound Description (Comments): incision site between second and forth toes on right foot Present on Admission: Yes   Dressing Type  Foam - Lift dressing to assess site every shift;Silver hydrofiber;Alginate    Dressing Changed  Changed    Dressing Status  Old drainage    Dressing Change Frequency  PRN    Site / Wound Assessment  Granulation tissue;Pale;Pink    % Wound base Red or Granulating  95%    %  Wound base Yellow/Fibrinous Exudate  5%    Peri-wound Assessment  Induration;Hemosiderin;Edema    Margins  Unattached edges (unapproximated)    Drainage Amount  Scant    Drainage Description  Serosanguineous    Treatment  Cleansed;Debridement (Selective)    Selective Debridement - Location  distal right foot between 2nd and 4th toes    Selective Debridement - Tools Used  Forceps    Selective Debridement - Tissue Removed  slough, devitalized tissue    Wound Therapy - Clinical Statement  Patient's dressing had slide down further on this date, despite additional cotton and abd pads used last session. Irritation noted along upper anterior, medial and posterior regions of right leg. Irritation noted as redness with 2 spots that had opened up at the skin (no depth, looked like friction irritation). Added foam in addition to the cotton to help with  swelling and help secure bandages in place. Will monitor irritation. Instructed patient to call clinic or remove bandages if they become to painful/intolerable to keep on. Minimal drainage noted at wound site. Will continue to dress with silver hydrofiber, profore and kerlix with alginate between toes to keep them dry. Spot on bottom of foot had no drainage. Instructed patient to follow up with radiology (provided number for patient to call) to schedule MRI.     Wound Therapy - Functional Problem List  pain with walking, standing and sleeping    Factors Delaying/Impairing Wound Healing  Diabetes Mellitus;Vascular compromise;Multiple medical problems    Hydrotherapy Plan  Debridement;Pulsatile lavage with suction    Wound Therapy - Frequency  2X / week    Wound Therapy - Current Recommendations  PT    Wound Plan  continue with appropriate woundcare to promote wound closure.  FU if appt make for MRI    Dressing   silver hydrofiber packed into wound bed, alginate between toe. Kerlix around toes to secure dressings then profore with foam for added compression and to decrease bandage from sliding down.                 PT Short Term Goals - 02/17/19 1537      PT SHORT TERM GOAL #1   Title  PT wound to be 50% granulated    Baseline  both wounds > 50%    Time  4    Period  Weeks    Status  Achieved    Target Date  02/18/19      PT SHORT TERM GOAL #2   Title  Patient will demonstrate at least 50% improvement in swelling in toes per visual inspection by PT    Baseline  Improved but still swollen    Time  4    Period  Weeks    Status  On-going    Target Date  02/18/19        PT Long Term Goals - 02/17/19 1537      PT LONG TERM GOAL #1   Title  Patient will report no more then 2/10 pain in his foot to improve QOL.    Baseline  current pan 0/10    Time  8    Period  Weeks    Status  Achieved      PT LONG TERM GOAL #2   Title  Wound will be 100% granulated    Baseline  one wound  50% one wound 85%    Time  8    Period  Weeks    Status  On-going  Patient will benefit from skilled therapeutic intervention in order to improve the following deficits and impairments:     Visit Diagnosis: Amputation of toe of right foot (HCC)  Open wound of right foot, initial encounter  Difficulty in walking, not elsewhere classified     Problem List Patient Active Problem List   Diagnosis Date Noted  . Closed fracture of proximal end of right humerus with routine healing 02/25/2019  . Pre-ulcerative calluses 04/20/2018  . 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 Diabetic foot ulcer associated with type 2 diabetes mellitus (HCC) 07/07/2014  . Tobacco abuse 07/07/2014  . Infestation, maggots 07/07/2014  . Plantar ulcer of right foot (HCC) 05/12/2014  . Pulse weakness   . Peripheral neuropathy   . Cellulitis of right lower extremity 05/09/2014  . Obesity 05/09/2014  . Cellulitis of right lower leg 05/09/2014  . Hyperglycemia 03/21/2013  . IDDM (insulin dependent diabetes mellitus) 03/21/2013  . Noncompliance with medications due to cost issues 03/21/2013    2:44 PM, 03/06/19 Tereasa Coop, DPT Physical Therapy with [facility]  [phone] office   [facility] [address] [place], [place], [address] Phone: [phone]   Fax:  [phone]  Name: Mitchell [name] MRN: [id number] Date of Birth: Jul 28, 1950

## 2019-03-09 ENCOUNTER — Other Ambulatory Visit: Payer: Self-pay

## 2019-03-09 ENCOUNTER — Ambulatory Visit (HOSPITAL_COMMUNITY): Payer: Medicare HMO | Admitting: Physical Therapy

## 2019-03-09 ENCOUNTER — Encounter (HOSPITAL_COMMUNITY): Payer: Self-pay | Admitting: Physical Therapy

## 2019-03-09 DIAGNOSIS — S91301A Unspecified open wound, right foot, initial encounter: Secondary | ICD-10-CM | POA: Diagnosis not present

## 2019-03-09 DIAGNOSIS — S98131A Complete traumatic amputation of one right lesser toe, initial encounter: Secondary | ICD-10-CM | POA: Diagnosis not present

## 2019-03-09 DIAGNOSIS — R262 Difficulty in walking, not elsewhere classified: Secondary | ICD-10-CM

## 2019-03-09 NOTE — Therapy (Signed)
[facility] [address] [place], [place], [address] Phone: [phone]   Fax:  [phone]  Wound Care Therapy  Patient Details  Name: Mitchell [name] MRN: [id number] Date of Birth: Aug 30, 1950 Referring Provider (PT): Nicholes Rough   Encounter Date: 03/09/2019  PT End of Session - 03/09/19 1450    Visit Number  18    Number of Visits  26    Date for PT Re-Evaluation  04/06/19    Authorization Type  [employer]na Medicare    Authorization Time Period  POC dates 01/21/19 to 03/18/2019; NEW POC DATES 03/02/19 to 04/06/2019, RECERT and PN performed on visit 16 - due on visit 26    Authorization - Visit Number  2    Authorization - Number of Visits  10    PT Start Time  1400    PT Stop Time  1438    PT Time Calculation (min)  38 min    Activity Tolerance  Patient tolerated treatment well;No increased pain    Behavior During Therapy  WFL for tasks assessed/performed       Past Medical History:  Diagnosis Date  . Arthritis   . Cellulitis of right lower extremity 05/09/2014  . Diabetes mellitus without complication (HCC)   . Diabetic foot ulcer (HCC) 07/07/2014   Status post bedside debridement of multiple right plantar ulcers by podiatrist, Dr. Reynolds Bowl.  . Hypercholesterolemia   . Maggot infestation    right foot ulcer  . Obesity 05/09/2014  . Tobacco abuse 07/07/2014    Past Surgical History:  Procedure Laterality Date  . APPENDECTOMY    . PERIPHERAL VASCULAR CATHETERIZATION N/A 07/22/2015   Procedure: Abdominal Aortogram w/Lower Extremity;  Surgeon: Sherren Kerns, MD;  Location: [facility] INVASIVE CV LAB;  Service: Cardiovascular;  Laterality: N/A;    There were no vitals filed for this visit.     [facility] PT Assessment - 03/09/19 0001      Assessment   Medical Diagnosis  diabetic ulcer of toe of right foot     Referring Provider (PT)  Nicholes Rough    Onset Date/Surgical Date  12/15/18              Wound Therapy - 03/09/19 1451    Subjective   Patient reports no discomfort with dressing since last visit. State not foot pain either, just shoulder pain.     Patient and Family Stated Goals  for incision/wound to heal    Date of Onset  12/15/18    Prior Treatments  betadine, wet to dry dressings, amputation of thrid toe on R foot    Pain Scale  0-10    Pain Score  0-No pain    Wound Properties Date First Assessed: 01/21/19 Time First Assessed: 0915 Wound Type: Diabetic ulcer;Incision - Dehisced Location: Foot Location Orientation: Right;Distal , at base of where third toe would be  Wound Description (Comments): incision site between second and forth toes on right foot Present on Admission: Yes   Dressing Type  Foam - Lift dressing to assess site every shift;Compression wrap;Impregnated gauze (bismuth);Silver hydrofiber    Dressing Changed  Changed    Dressing Status  Old drainage    Dressing Change Frequency  PRN    Site / Wound Assessment  Granulation tissue;Pale;Pink    % Wound base Red or Granulating  95%    % Wound base Yellow/Fibrinous Exudate  5%    Peri-wound Assessment  Induration;Hemosiderin;Edema    Margins  Unattached edges (  unapproximated)    Drainage Amount  Minimal    Drainage Description  Serosanguineous;Odor    Treatment  Cleansed;Debridement (Selective)    Selective Debridement - Location  distal right foot between 2nd and 4th toes    Selective Debridement - Tools Used  Forceps    Selective Debridement - Tissue Removed  slough, devitalized tissue    Wound Therapy - Clinical Statement  Bandage stayed up better with added foam to dressing. Decreased irritation noted along upper part of lower leg. Blister that was noted along anterior shin popped since last visit and additional blisters noted along medial aspect of middle shin (smaller then a dime). Cleansed area well and applied lotion and retrograde massage to help with edema and induration. Continued with silver hydrofiber, alginate between toes, added xeroform to  irritated spots including popped blister. Continued with profore and foam for compression. Will continue to monitor blisters and irritated skin.     Wound Therapy - Functional Problem List  pain with walking, standing and sleeping    Factors Delaying/Impairing Wound Healing  Diabetes Mellitus;Vascular compromise;Multiple medical problems    Hydrotherapy Plan  Debridement;Pulsatile lavage with suction    Wound Therapy - Frequency  2X / week    Wound Therapy - Current Recommendations  PT    Wound Plan  continue with appropriate woundcare to promote wound closure.  FU if appt make for MRI    Dressing   silver hydrofiber packed into wound bed, alginate between toe. Kerlix around toes to secure dressings then profore with foam for added compression and to decrease bandage from sliding down.                 PT Short Term Goals - 02/17/19 1537      PT SHORT TERM GOAL #1   Title  PT wound to be 50% granulated    Baseline  both wounds > 50%    Time  4    Period  Weeks    Status  Achieved    Target Date  02/18/19      PT SHORT TERM GOAL #2   Title  Patient will demonstrate at least 50% improvement in swelling in toes per visual inspection by PT    Baseline  Improved but still swollen    Time  4    Period  Weeks    Status  On-going    Target Date  02/18/19        PT Long Term Goals - 02/17/19 1537      PT LONG TERM GOAL #1   Title  Patient will report no more then 2/10 pain in his foot to improve QOL.    Baseline  current pan 0/10    Time  8    Period  Weeks    Status  Achieved      PT LONG TERM GOAL #2   Title  Wound will be 100% granulated    Baseline  one wound 50% one wound 85%    Time  8    Period  Weeks    Status  On-going            Plan - 03/09/19 1450    Clinical Impression Statement  see above    Personal Factors and Comorbidities  Age;Comorbidity 1;Comorbidity 2;Fitness    Examination-Activity Limitations  Bathing;Dressing;Hygiene/Grooming;Locomotion  Level;Stand;Stairs;Squat;Sleep;Transfers    Examination-Participation Restrictions  Cleaning;Driving;Community Activity    Stability/Clinical Decision Making  Evolving/Moderate complexity    Rehab Potential  Good  PT Frequency  2x / week    PT Duration  --   5 weeks   PT Treatment/Interventions  Other (comment);Compression bandaging;Scar mobilization;Patient/family education;Manual techniques   wound debridment, pulse lavage   PT Next Visit Plan  continue with POC; include plantar aspect opening in PN to MD    Consulted and Agree with Plan of Care  Patient       Patient will benefit from skilled therapeutic intervention in order to improve the following deficits and impairments:  Abnormal gait, Decreased activity tolerance, Decreased balance, Decreased endurance, Decreased knowledge of precautions, Decreased mobility, Decreased range of motion, Difficulty walking, Pain, Other (comment), Increased edema, Impaired sensation, Decreased skin integrity(wound)  Visit Diagnosis: Amputation of toe of right foot (HCC)  Open wound of right foot, initial encounter  Difficulty in walking, not elsewhere classified     Problem List Patient Active Problem List   Diagnosis Date Noted  . Closed fracture of proximal end of right humerus with routine healing 02/25/2019  . Pre-ulcerative calluses 04/20/2018  . 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 Diabetic foot ulcer associated with type 2 diabetes mellitus (HCC) 07/07/2014  . Tobacco abuse 07/07/2014  . Infestation, maggots 07/07/2014  . Plantar ulcer of right foot (HCC) 05/12/2014  . Pulse weakness   . Peripheral neuropathy   . Cellulitis of right lower extremity 05/09/2014  . Obesity 05/09/2014  . Cellulitis of right lower leg 05/09/2014  . Hyperglycemia 03/21/2013  . IDDM (insulin dependent diabetes mellitus) 03/21/2013  . Noncompliance with medications due to cost issues 03/21/2013    2:59 PM, 03/09/19 Tereasa Coop, DPT Physical Therapy with  [facility]  [phone] office  [facility] [address] [place], [place], [address] Phone: [phone]   Fax:  [phone]  Name: Shamar Engelmann MRN: [id number] Date of Birth: 1951-01-10

## 2019-03-12 ENCOUNTER — Other Ambulatory Visit: Payer: Self-pay

## 2019-03-12 ENCOUNTER — Encounter (HOSPITAL_COMMUNITY): Payer: Self-pay | Admitting: Physical Therapy

## 2019-03-12 ENCOUNTER — Ambulatory Visit (HOSPITAL_COMMUNITY): Payer: Medicare HMO | Admitting: Physical Therapy

## 2019-03-12 DIAGNOSIS — S91301A Unspecified open wound, right foot, initial encounter: Secondary | ICD-10-CM

## 2019-03-12 DIAGNOSIS — S98131A Complete traumatic amputation of one right lesser toe, initial encounter: Secondary | ICD-10-CM | POA: Diagnosis not present

## 2019-03-12 DIAGNOSIS — R262 Difficulty in walking, not elsewhere classified: Secondary | ICD-10-CM | POA: Diagnosis not present

## 2019-03-12 NOTE — Therapy (Signed)
[place], [place], [address] Phone: [phone]   Fax:  [phone]  Wound Care Therapy  Patient Details  Name: Mitchell [name] MRN: [id number] Date of Birth: 11/05/1950 Referring Provider (PT): [name]   Encounter Date: 03/12/2019  PT End of Session - 03/12/19 1149    Visit Number  19    Number of Visits  26    Date for PT Re-Evaluation  04/06/19    Authorization Type  [employer]na Medicare    Authorization Time Period  POC dates 01/21/19 to 03/18/2019; NEW POC DATES 03/02/19 to 3/54/6568, RECERT and PN performed on visit 16 - due on visit 79    Authorization - Visit Number  3    Authorization - Number of Visits  10    PT Start Time  1005    PT Stop Time  1045    PT Time Calculation (min)  40 min    Activity Tolerance  Patient tolerated treatment well;No increased pain    Behavior During Therapy  WFL for tasks assessed/performed       Past Medical History:  Diagnosis Date  . Arthritis   . Cellulitis of right lower extremity 05/09/2014  . Diabetes mellitus without complication ([place])   . Diabetic foot ulcer ([place]) 07/07/2014   Status post bedside debridement of multiple right plantar ulcers by podiatrist, Dr. Laurena Spies.  . Hypercholesterolemia   . Maggot infestation    right foot ulcer  . Obesity 05/09/2014  . Tobacco abuse 07/07/2014    Past Surgical History:  Procedure Laterality Date  . APPENDECTOMY    . PERIPHERAL VASCULAR CATHETERIZATION N/A 07/22/2015   Procedure: Abdominal Aortogram w/Lower Extremity;  Surgeon: Elam Dutch, MD;  Location: [place] CV LAB;  Service: Cardiovascular;  Laterality: N/A;    There were no vitals filed for this visit.     [facility] PT Assessment - 03/12/19 0001      Assessment   Medical Diagnosis  diabetic ulcer of toe of right foot     Referring Provider (PT)  [name]    Onset Date/Surgical Date  12/15/18              Wound Therapy - 03/12/19 1151    Subjective   States he feels good. No pain or difficulties with bandaging.    Patient and Family Stated Goals  for incision/wound to heal    Date of Onset  12/15/18    Prior Treatments  betadine, wet to dry dressings, amputation of thrid toe on R foot    Pain Scale  0-10    Pain Score  0-No pain    Wound Properties Date First Assessed: 01/21/19 Time First Assessed: 0915 Wound Type: Diabetic ulcer;Incision - Dehisced Location: Foot Location Orientation: Right;Distal , at base of where third toe would be  Wound Description (Comments): incision site between second and forth toes on right foot Present on Admission: Yes   Dressing Type  Foam - Lift dressing to assess site every shift;Compression wrap;Impregnated gauze (bismuth);Silver hydrofiber    Dressing Changed  Changed    Dressing Status  Old drainage    Dressing Change Frequency  PRN    Site / Wound Assessment  Granulation tissue;Pale;Pink    % Wound base Red or Granulating  95%    % Wound base Yellow/Fibrinous Exudate  5%    Peri-wound Assessment  Induration;Hemosiderin;Edema    Wound Length (cm)  1.2 cm    Wound Width (cm)  0.4 cm    Wound Depth (cm)  0.5 cm    Wound Volume (cm^3)  0.24 cm^3    Wound Surface Area (cm^2)  0.48 cm^2    Margins  Unattached edges (unapproximated)    Drainage Amount  Scant    Drainage Description  Serosanguineous    Treatment  Cleansed;Debridement (Selective)    Selective Debridement - Location  distal right foot between 2nd and 4th toes    Selective Debridement - Tools Used  Forceps    Selective Debridement - Tissue Removed  slough, devitalized tissue    Wound Therapy - Clinical Statement  Patient tolerated session well. Continues to have minimal drainage. Blisters in lower leg are healing well, continued with xeroform over them to promote healing and antimicrobial environment. Continued with silver hydrofiber and alginate between toes followed by foam and profore. Patient would continue to benefit from skilled physical  therapy to promote optimal wound healing.     Wound Therapy - Functional Problem List  pain with walking, standing and sleeping    Factors Delaying/Impairing Wound Healing  Diabetes Mellitus;Vascular compromise;Multiple medical problems    Hydrotherapy Plan  Debridement;Pulsatile lavage with suction    Wound Therapy - Frequency  2X / week    Wound Therapy - Current Recommendations  PT    Wound Plan  continue with appropriate woundcare to promote wound closure.  FU if appt make for MRI    Dressing   silver hydrofiber packed into wound bed, alginate between toe. Kerlix around toes to secure dressings then profore with foam for added compression and to decrease bandage from sliding down.                 PT Short Term Goals - 02/17/19 1537      PT SHORT TERM GOAL #1   Title  PT wound to be 50% granulated    Baseline  both wounds > 50%    Time  4    Period  Weeks    Status  Achieved    Target Date  02/18/19      PT SHORT TERM GOAL #2   Title  Patient will demonstrate at least 50% improvement in swelling in toes per visual inspection by PT    Baseline  Improved but still swollen    Time  4    Period  Weeks    Status  On-going    Target Date  02/18/19        PT Long Term Goals - 02/17/19 1537      PT LONG TERM GOAL #1   Title  Patient will report no more then 2/10 pain in his foot to improve QOL.    Baseline  current pan 0/10    Time  8    Period  Weeks    Status  Achieved      PT LONG TERM GOAL #2   Title  Wound will be 100% granulated    Baseline  one wound 50% one wound 85%    Time  8    Period  Weeks    Status  On-going              Patient will benefit from skilled therapeutic intervention in order to improve the following deficits and impairments:     Visit Diagnosis: Amputation of toe of right foot (HCC)  Open wound of right foot, initial encounter  Difficulty in walking, not elsewhere classified     Problem List Patient Active  Problem  List   Diagnosis Date Noted  . Closed fracture of proximal end of right humerus with routine healing 02/25/2019  . Pre-ulcerative calluses 04/20/2018  . 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 Diabetic foot ulcer associated with type 2 diabetes mellitus (HCC) 07/07/2014  . Tobacco abuse 07/07/2014  . Infestation, maggots 07/07/2014  . Plantar ulcer of right foot (HCC) 05/12/2014  . Pulse weakness   . Peripheral neuropathy   . Cellulitis of right lower extremity 05/09/2014  . Obesity 05/09/2014  . Cellulitis of right lower leg 05/09/2014  . Hyperglycemia 03/21/2013  . IDDM (insulin dependent diabetes mellitus) 03/21/2013  . Noncompliance with medications due to cost issues 03/21/2013   11:53 AM, 03/12/19 Tereasa Coop, DPT Physical Therapy with [facility]  [phone] office  [facility] [address] [place], [place], [address] Phone: [phone]   Fax:  [phone]  Name: Armonie Staten MRN: [id number] Date of Birth: [birth year]

## 2019-03-15 DIAGNOSIS — E119 Type 2 diabetes mellitus without complications: Secondary | ICD-10-CM | POA: Diagnosis not present

## 2019-03-16 ENCOUNTER — Ambulatory Visit (HOSPITAL_COMMUNITY): Payer: Medicare HMO | Attending: Podiatry | Admitting: Physical Therapy

## 2019-03-16 ENCOUNTER — Other Ambulatory Visit: Payer: Self-pay

## 2019-03-16 ENCOUNTER — Encounter (HOSPITAL_COMMUNITY): Payer: Self-pay | Admitting: Physical Therapy

## 2019-03-16 DIAGNOSIS — R262 Difficulty in walking, not elsewhere classified: Secondary | ICD-10-CM

## 2019-03-16 DIAGNOSIS — S91301A Unspecified open wound, right foot, initial encounter: Secondary | ICD-10-CM | POA: Diagnosis not present

## 2019-03-16 DIAGNOSIS — S98131A Complete traumatic amputation of one right lesser toe, initial encounter: Secondary | ICD-10-CM

## 2019-03-16 NOTE — Therapy (Signed)
[facility] [place], [place], [address] Phone: [phone]   Fax:  [phone]  Wound Care Therapy  Patient Details  Name: Mitchell [name] MRN: [id number] Date of Birth: 1950/11/03 Referring Provider (PT): [name]   Encounter Date: 03/16/2019  PT End of Session - 03/16/19 1225    Visit Number  20    Number of Visits  26    Date for PT Re-Evaluation  04/06/19    Authorization Type  [employer]na Medicare    Authorization Time Period  POC dates 01/21/19 to 03/18/2019; NEW POC DATES 03/02/19 to 07/21/2374, RECERT and PN performed on visit 16 - due on visit 27    Authorization - Visit Number  4    Authorization - Number of Visits  10    PT Start Time  1045    PT Stop Time  1125    PT Time Calculation (min)  40 min    Activity Tolerance  Patient tolerated treatment well;No increased pain    Behavior During Therapy  WFL for tasks assessed/performed       Past Medical History:  Diagnosis Date  . Arthritis   . Cellulitis of right lower extremity 05/09/2014  . Diabetes mellitus without complication ([place])   . Diabetic foot ulcer ([place]) 07/07/2014   Status post bedside debridement of multiple right plantar ulcers by podiatrist, Dr. Laurena Spies.  . Hypercholesterolemia   . Maggot infestation    right foot ulcer  . Obesity 05/09/2014  . Tobacco abuse 07/07/2014    Past Surgical History:  Procedure Laterality Date  . APPENDECTOMY    . PERIPHERAL VASCULAR CATHETERIZATION N/A 07/22/2015   Procedure: Abdominal Aortogram w/Lower Extremity;  Surgeon: Elam Dutch, MD;  Location: [facility];  Service: Cardiovascular;  Laterality: N/A;    There were no vitals filed for this visit.     [facility] PT Assessment - 03/16/19 0001      Assessment   Medical Diagnosis  diabetic ulcer of toe of right foot     Referring Provider (PT)  [name]    Onset Date/Surgical Date  12/15/18              Wound Therapy - 03/16/19 1228    Subjective   Patient states his right shoulder has been really bothering him. Reports no pain with his leg/foot. States he scheduled his MRI and the soonest he could get in was 3/19. He follows up with his foot doctor on Wednesday.     Patient and Family Stated Goals  for incision/wound to heal    Date of Onset  12/15/18    Prior Treatments  betadine, wet to dry dressings, amputation of thrid toe on R foot    Pain Scale  0-10    Pain Score  0-No pain    Wound Properties Date First Assessed: 01/21/19 Time First Assessed: 0915 Wound Type: Diabetic ulcer;Incision - Dehisced Location: Foot Location Orientation: Right;Distal , at base of where third toe would be  Wound Description (Comments): incision site between second and forth toes on right foot Present on Admission: Yes   Dressing Type  Foam - Lift dressing to assess site every shift;Compression wrap;Impregnated gauze (bismuth);Silver hydrofiber    Dressing Changed  Changed    Dressing Status  Old drainage    Dressing Change Frequency  PRN    Site / Wound Assessment  Granulation tissue;Pale;Pink    % Wound base Red or Granulating  100%    %  Wound base Yellow/Fibrinous Exudate  0%    Peri-wound Assessment  Induration;Hemosiderin;Edema    Margins  Unattached edges (unapproximated)    Drainage Amount  Scant    Drainage Description  Serosanguineous    Treatment  Cleansed;Debridement (Selective)    Selective Debridement - Location  distal right foot between 2nd and 4th toes    Selective Debridement - Tools Used  Forceps    Selective Debridement - Tissue Removed  slough, devitalized tissue    Wound Therapy - Clinical Statement  Patient's initial wound from amputation site continues to heal. Minimal/scant drainage noted. Patient does present with 2 popped blisters on his lower leg, one on the posterior aspect of his upper calf on the medial side, and one on the middle lateral part of his posterior calf. All other blisters have healed. Cleanses areas well and  applied xeroform to continue to promote healing. Bloody drainage noted on these spots on this date. Will continue to monitor. Will continue with current plan with silver hydrofiber, profore and foam for additional compression. Patient would continue to benefit from skilled physical therapy at this time.     Wound Therapy - Functional Problem List  pain with walking, standing and sleeping    Factors Delaying/Impairing Wound Healing  Diabetes Mellitus;Vascular compromise;Multiple medical problems    Hydrotherapy Plan  Debridement;Pulsatile lavage with suction    Wound Therapy - Frequency  2X / week    Wound Therapy - Current Recommendations  PT    Wound Plan  continue with appropriate woundcare to promote wound closure.  FU if appt make for MRI    Dressing   silver hydrofiber packed into wound bed, alginate between toe. Kerlix around toes to secure dressings then profore with foam for added compression and to decrease bandage from sliding down.                 PT Short Term Goals - 02/17/19 1537      PT SHORT TERM GOAL #1   Title  PT wound to be 50% granulated    Baseline  both wounds > 50%    Time  4    Period  Weeks    Status  Achieved    Target Date  02/18/19      PT SHORT TERM GOAL #2   Title  Patient will demonstrate at least 50% improvement in swelling in toes per visual inspection by PT    Baseline  Improved but still swollen    Time  4    Period  Weeks    Status  On-going    Target Date  02/18/19        PT Long Term Goals - 02/17/19 1537      PT LONG TERM GOAL #1   Title  Patient will report no more then 2/10 pain in his foot to improve QOL.    Baseline  current pan 0/10    Time  8    Period  Weeks    Status  Achieved      PT LONG TERM GOAL #2   Title  Wound will be 100% granulated    Baseline  one wound 50% one wound 85%    Time  8    Period  Weeks    Status  On-going            Plan - 03/16/19 1225    Clinical Impression Statement  see above     Personal Factors and Comorbidities  Age;Comorbidity  1;Comorbidity 2;Fitness    Examination-Activity Limitations  Bathing;Dressing;Hygiene/Grooming;Locomotion Level;Stand;Stairs;Squat;Sleep;Transfers    Examination-Participation Restrictions  Cleaning;Driving;Community Activity    Stability/Clinical Decision Making  Evolving/Moderate complexity    Rehab Potential  Good    PT Frequency  2x / week    PT Duration  --   5 weeks   PT Treatment/Interventions  Other (comment);Compression bandaging;Scar mobilization;Patient/family education;Manual techniques   wound debridment, pulse lavage   PT Next Visit Plan  continue with POC; f/u with patient about MD apt    Consulted and Agree with Plan of Care  Patient       Patient will benefit from skilled therapeutic intervention in order to improve the following deficits and impairments:  Abnormal gait, Decreased activity tolerance, Decreased balance, Decreased endurance, Decreased knowledge of precautions, Decreased mobility, Decreased range of motion, Difficulty walking, Pain, Other (comment), Increased edema, Impaired sensation, Decreased skin integrity(wound)  Visit Diagnosis: Amputation of toe of right foot (HCC)  Open wound of right foot, initial encounter  Difficulty in walking, not elsewhere classified     Problem List Patient Active Problem List   Diagnosis Date Noted  . Closed fracture of proximal end of right humerus with routine healing 02/25/2019  . Pre-ulcerative calluses 04/20/2018  . 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 Diabetic foot ulcer associated with type 2 diabetes mellitus (HCC) 07/07/2014  . Tobacco abuse 07/07/2014  . Infestation, maggots 07/07/2014  . Plantar ulcer of right foot (HCC) 05/12/2014  . Pulse weakness   . Peripheral neuropathy   . Cellulitis of right lower extremity 05/09/2014  . Obesity 05/09/2014  . Cellulitis of right lower leg 05/09/2014  . Hyperglycemia 03/21/2013  . IDDM (insulin dependent diabetes  mellitus) 03/21/2013  . Noncompliance with medications due to cost issues 03/21/2013    12:33 PM, 03/16/19 Tereasa Coop, DPT Physical Therapy with [facility]  [phone] office  [facility] [address] [place], [place], [address] Phone: [phone]   Fax:  [phone]  Name: Kailer Heindel MRN: [id number] Date of Birth: 14-Sep-1950

## 2019-03-18 ENCOUNTER — Other Ambulatory Visit: Payer: Self-pay

## 2019-03-18 ENCOUNTER — Ambulatory Visit: Payer: Medicare HMO | Admitting: Podiatry

## 2019-03-18 DIAGNOSIS — S98131A Complete traumatic amputation of one right lesser toe, initial encounter: Secondary | ICD-10-CM

## 2019-03-18 DIAGNOSIS — M80871A Other osteoporosis with current pathological fracture, right ankle and foot, initial encounter for fracture: Secondary | ICD-10-CM | POA: Diagnosis not present

## 2019-03-18 DIAGNOSIS — M869 Osteomyelitis, unspecified: Secondary | ICD-10-CM

## 2019-03-19 ENCOUNTER — Telehealth: Payer: Self-pay | Admitting: *Deleted

## 2019-03-19 ENCOUNTER — Ambulatory Visit (HOSPITAL_COMMUNITY): Payer: Medicare HMO | Admitting: Physical Therapy

## 2019-03-19 ENCOUNTER — Encounter: Payer: Self-pay | Admitting: Podiatry

## 2019-03-19 DIAGNOSIS — S91301A Unspecified open wound, right foot, initial encounter: Secondary | ICD-10-CM | POA: Diagnosis not present

## 2019-03-19 DIAGNOSIS — S98131A Complete traumatic amputation of one right lesser toe, initial encounter: Secondary | ICD-10-CM | POA: Diagnosis not present

## 2019-03-19 DIAGNOSIS — R262 Difficulty in walking, not elsewhere classified: Secondary | ICD-10-CM | POA: Diagnosis not present

## 2019-03-19 NOTE — Telephone Encounter (Signed)
I informed pt that his prior authorization for the MRI was still valid until 08/04/2019 and was originally scheduled with [facility] [phone] and he could schedule with them. Faxed the orders with a note of PA to [facility].

## 2019-03-19 NOTE — Therapy (Signed)
[facility] [address] [place], [place], [address] Phone: [phone]   Fax:  [phone]  Wound Care Therapy  Patient Details  Name: Mitchell [name] MRN: [id number] Date of Birth: 17-Apr-1950 Referring Provider (PT): Nicholes Rough   Encounter Date: 03/19/2019  PT End of Session - 03/19/19 1159    Visit Number  21    Number of Visits  26    Date for PT Re-Evaluation  04/06/19    Authorization Type  Aetna Medicare    Authorization Time Period  POC dates 01/21/19 to 03/18/2019; NEW POC DATES 03/02/19 to 04/06/2019, RECERT and PN performed on visit 16 - due on visit 26    Authorization - Visit Number  5    Authorization - Number of Visits  10    PT Start Time  1050    PT Stop Time  1135    PT Time Calculation (min)  45 min    Activity Tolerance  Patient tolerated treatment well;No increased pain    Behavior During Therapy  WFL for tasks assessed/performed       Past Medical History:  Diagnosis Date  . Arthritis   . Cellulitis of right lower extremity 05/09/2014  . Diabetes mellitus without complication (HCC)   . Diabetic foot ulcer (HCC) 07/07/2014   Status post bedside debridement of multiple right plantar ulcers by podiatrist, Dr. Reynolds Bowl.  . Hypercholesterolemia   . Maggot infestation    right foot ulcer  . Obesity 05/09/2014  . Tobacco abuse 07/07/2014    Past Surgical History:  Procedure Laterality Date  . APPENDECTOMY    . PERIPHERAL VASCULAR CATHETERIZATION N/A 07/22/2015   Procedure: Abdominal Aortogram w/Lower Extremity;  Surgeon: Sherren Kerns, MD;  Location: [facility];  Service: Cardiovascular;  Laterality: N/A;    There were no vitals filed for this visit.   Subjective Assessment - 03/19/19 1153    Subjective  pt states MD is pleased. comes today with bandage half removed from MD inspecting.  STates what he wrapped around it fell off when he was changing clothes.  No issues or other wounds noted.                 Wound Therapy - 03/19/19 1154    Subjective  no issues today.    Patient and Family Stated Goals  for incision/wound to heal    Date of Onset  12/15/18    Prior Treatments  betadine, wet to dry dressings, amputation of thrid toe on R foot    Pain Scale  0-10    Pain Score  0-No pain    Wound Properties Date First Assessed: 01/21/19 Time First Assessed: 0915 Wound Type: Diabetic ulcer;Incision - Dehisced Location: Foot Location Orientation: Right;Distal , at base of where third toe would be  Wound Description (Comments): incision site between second and forth toes on right foot Present on Admission: Yes   Dressing Type  Foam - Lift dressing to assess site every shift;Compression wrap;Impregnated gauze (bismuth);Silver hydrofiber    Dressing Changed  Changed    Dressing Status  Old drainage    Dressing Change Frequency  PRN    Site / Wound Assessment  Granulation tissue;Pale;Pink    % Wound base Red or Granulating  100%    % Wound base Yellow/Fibrinous Exudate  0%    Peri-wound Assessment  Induration;Hemosiderin;Edema    Margins  Unattached edges (unapproximated)    Drainage Amount  Scant    Drainage  Description  Serosanguineous    Treatment  Cleansed;Debridement (Selective)    Selective Debridement - Location  distal right foot between 2nd and 4th toes    Selective Debridement - Tools Used  Forceps    Selective Debridement - Tissue Removed  slough, devitalized tissue    Wound Therapy - Clinical Statement  Wound continues to approximate with little drainage and overall improved skin integrity.  One abraison on posteiror calf fully granulated and expect to heal within 1-2 more sessions.   Dry skin debrided from plantar and lateral aspect of foot today.  Moisturized well prior to redressing.  Perimeter of wound also debrided of devitalized tissue.      Wound Therapy - Functional Problem List  pain with walking, standing and sleeping    Factors Delaying/Impairing Wound  Healing  Diabetes Mellitus;Vascular compromise;Multiple medical problems    Hydrotherapy Plan  Debridement;Pulsatile lavage with suction    Wound Therapy - Frequency  2X / week    Wound Therapy - Current Recommendations  PT    Wound Plan  continue with appropriate woundcare to promote wound closure. MRI scheduled for 3/19.  Measure weekly.    Dressing   silver hydrofiber packed into wound bed, alginate between toe. Kerlix around toes to secure dressings then profore with foam for added compression and to decrease bandage from sliding down.                 PT Short Term Goals - 02/17/19 1537      PT SHORT TERM GOAL #1   Title  PT wound to be 50% granulated    Baseline  both wounds > 50%    Time  4    Period  Weeks    Status  Achieved    Target Date  02/18/19      PT SHORT TERM GOAL #2   Title  Patient will demonstrate at least 50% improvement in swelling in toes per visual inspection by PT    Baseline  Improved but still swollen    Time  4    Period  Weeks    Status  On-going    Target Date  02/18/19        PT Long Term Goals - 02/17/19 1537      PT LONG TERM GOAL #1   Title  Patient will report no more then 2/10 pain in his foot to improve QOL.    Baseline  current pan 0/10    Time  8    Period  Weeks    Status  Achieved      PT LONG TERM GOAL #2   Title  Wound will be 100% granulated    Baseline  one wound 50% one wound 85%    Time  8    Period  Weeks    Status  On-going              Patient will benefit from skilled therapeutic intervention in order to improve the following deficits and impairments:     Visit Diagnosis: Difficulty in walking, not elsewhere classified  Open wound of right foot, initial encounter  Amputation of toe of right foot [facility])     Problem List Patient Active Problem List   Diagnosis Date Noted  . Closed fracture of proximal end of right humerus with routine healing 02/25/2019  . Pre-ulcerative calluses 04/20/2018   . 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 Diabetic foot ulcer associated with type 2 diabetes mellitus ([place]) 07/07/2014  .  Tobacco abuse 07/07/2014  . Infestation, maggots 07/07/2014  . Plantar ulcer of right foot (HCC) 05/12/2014  . Pulse weakness   . Peripheral neuropathy   . Cellulitis of right lower extremity 05/09/2014  . Obesity 05/09/2014  . Cellulitis of right lower leg 05/09/2014  . Hyperglycemia 03/21/2013  . IDDM (insulin dependent diabetes mellitus) 03/21/2013  . Noncompliance with medications due to cost issues 03/21/2013   Lurena Nida, PTA/CLT [phone]  Lurena Nida 03/19/2019, 12:00 PM  [facility] [address] [place], [place], [address] Phone: [phone]   Fax:  [phone]  Name: Mitchell [name] MRN: [id number] Date of Birth: 1950/02/07

## 2019-03-19 NOTE — Progress Notes (Signed)
Subjective:  Patient ID: Mitchell [name], male    DOB: 1950-11-21,  MRN: [id number]  Chief Complaint  Patient presents with  . Foot Ulcer    pt is here for a ulcer of the right foot, pt is doing a lot better since the last time he was here, pt is well bandaged, and shows no clinical signs of infection.    69 y.o. 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  Patient states the wound care center nearby his house has been doing amazing job the wound is completely healed.  There is a small area of ulceration there is very superficial.  The wound care is continue to do the phenomenal job to decrease the wound.  At this time I will let the wound care center manage the wound completely.  However patient presents today for an MRI evaluation of the right lower extremity given that there was some pathologic fractures that was found in an acute setting with nonhealing wound and I was concerned for osteomyelitic changes.  Patient had a broken arm prior to getting the MRI done and was being treated for that and we have discussed to hold off the MRI for now until his arm has been evaluated and treated.  Patient now presents today for evaluation of the right foot and to get the MRI of the right foot.  He denies any other acute complaints.  No nausea fever chills.  No signs of infection.  Review of Systems: Negative except as noted in the HPI. Denies N/V/F/Ch.  Past Medical History:  Diagnosis Date  . Arthritis   . Cellulitis of right lower extremity 05/09/2014  . Diabetes mellitus without complication (HCC)   . Diabetic foot ulcer (HCC) 07/07/2014   Status post bedside debridement of multiple right plantar ulcers by podiatrist, Dr. Reynolds Bowl.  . Hypercholesterolemia   . Maggot infestation    right foot ulcer  . Obesity 05/09/2014  . Tobacco abuse 07/07/2014    Current Outpatient Medications:  .  aspirin EC 81 MG tablet, Take 81 mg by mouth  daily., Disp: , Rfl:  .  doxycycline (VIBRA-TABS) 100 MG tablet, Take 1 tablet (100 mg total) by mouth 2 (two) times daily., Disp: 28 tablet, Rfl: 0 .  furosemide (LASIX) 20 MG tablet, Take 1 tablet (20 mg total) by mouth daily., Disp: 30 tablet, Rfl: 0 .  gabapentin (NEURONTIN) 300 MG capsule, Take 1 capsule (300 mg total) by mouth 3 (three) times daily., Disp: 90 capsule, Rfl: 0 .  HYDROcodone-acetaminophen (NORCO/VICODIN) 5-325 MG tablet, Take 1-2 tablets by mouth every 6 (six) hours as needed for moderate pain., Disp: 40 tablet, Rfl: 0 .  ibuprofen (ADVIL) 800 MG tablet, Take 1 tablet (800 mg total) by mouth every 6 (six) hours as needed., Disp: 60 tablet, Rfl: 1 .  JARDIANCE 25 MG TABS tablet, Take 25 mg by mouth daily. , Disp: , Rfl:  .  lovastatin (MEVACOR) 20 MG tablet, Take 1 tablet (20 mg total) by mouth at bedtime., Disp: 30 tablet, Rfl: 0 .  metFORMIN (GLUCOPHAGE) 500 MG tablet, Take 1,000 mg by mouth 2 (two) times daily., Disp: , Rfl:  .  NOVOLOG FLEXPEN 100 UNIT/ML FlexPen, Inject 15-30 Units into the skin See admin instructions. 15 units with breakfast 30 units with lunch 30 units with dinner, Disp: , Rfl:  .  TRESIBA FLEXTOUCH 200 UNIT/ML SOPN, Inject 55 Units into the skin daily., Disp: , Rfl:  Social History   Tobacco Use  Smoking Status Current Every Day Smoker  . Packs/day: 1.50  . Types: Cigarettes  Smokeless Tobacco Never Used    Allergies  Allergen Reactions  . Penicillins Itching and Rash    Has patient had a PCN reaction causing immediate rash, facial/tongue/throat swelling, SOB or lightheadedness with hypotension: Yes Has patient had a PCN reaction causing severe rash involving mucus membranes or skin necrosis: No Has patient had a PCN reaction that required hospitalization No Has patient had a PCN reaction occurring within the last 10 years: No If all of the above answers are "NO", then may proceed with Cephalosporin use.    Objective:  There were no vitals  filed for this visit. There is no height or weight on file to calculate BMI. Constitutional Well developed. Well nourished.  Vascular Foot warm and well perfused. Capillary refill normal to all digits.   Neurologic Normal speech. Oriented to person, place, and time. Epicritic sensation to light touch grossly present bilaterally.  Dermatologic  knee amputation dehiscence wound is mostly closed and reepithelialized.  Small ulceration still present at the proximal portion of the incision.  This appears to be very superficial in nature.  No clinical signs of infection.  No maceration present.  Orthopedic: Tenderness to palpation noted about the surgical site.   Radiographs:None  Assessment:   1. Osteomyelitis of right foot, unspecified type (HCC)   2. Amputation of toe of right foot (HCC)   3. Pathological fracture of right foot due to other osteoporosis, initial encounter    Plan:  Patient was evaluated and treated and all questions answered.  History right third digit amputation -It appears that the wound has mostly reepithelialized thanks to the local wound care center nearby his house.  Patient has been following weekly to the wound care center.  I will defer further management of wound due to amputation to them.  Pathologic fracture of second metatarsal head/third metatarsal head -Given the x-ray findings above, I believe patient will benefit from an MRI evaluation to look at if there is any osteomyelitic presence noted at second metatarsal as well as third metatarsal.  I explained to the patient if this comes back as positive for infection he will end up losing all of his toes and I will need to perform a transmetatarsal amputation given that he has a history of a partial fifth metatarsal resection.  Patient agrees with this plan and will obtain an MRI as soon as possible. -Patient was not able to obtain an MRI because he underwent a fall that resulted in breaking his arm that may need  an orthopedic evaluation and possible surgical management.  At this time patient would like to hold off on the MRI.  I explained to the patient that there may be infection in there that could get worse and if we do not get it evaluated.  However he does not feel comfortable getting an MRI at this time.  I believe given the greatly improvement in the wound and deep tissue closing over the wound itself I believe this may likely be stress fracture as opposed to osteomyelitic changes/pathologic fracture.   -It appears that patient will be managed nonoperatively for his arm and therefore is able to get the MRI of the right foot.  Given that patient has clinical improvement of the wound with only small superficial wound this not quite healed yet, I am less likely to think that this pathologic fracture is infectious related as  the wound would not close over it. -However patient will be scheduled for [facility] imaging to get the MRI of the right foot to rule out any other osseous abnormalities including osteomyelitis.  No follow-ups on file.

## 2019-03-19 NOTE — Telephone Encounter (Signed)
-----   Message from Candelaria Stagers, DPM sent at 03/19/2019  1:03 PM EST ----- Regarding: MRI Rescehdule Hi Mitchell [name],  Can you schedule a MRI to be done in [facility].  Patient would like to have the MRI done at this facility.  Patient was supposed to get an MRI done in February but he fell and broke his arm prior to undergoing the MRI was not able to get it done.  He is amenable to get the MRI now but would like to do it at the [facility] imaging facility  Thanks Caryn Bee

## 2019-03-23 ENCOUNTER — Encounter (HOSPITAL_COMMUNITY): Payer: Self-pay | Admitting: Physical Therapy

## 2019-03-23 ENCOUNTER — Other Ambulatory Visit: Payer: Self-pay

## 2019-03-23 ENCOUNTER — Ambulatory Visit (HOSPITAL_COMMUNITY): Payer: Medicare HMO | Admitting: Physical Therapy

## 2019-03-23 DIAGNOSIS — S98131A Complete traumatic amputation of one right lesser toe, initial encounter: Secondary | ICD-10-CM

## 2019-03-23 DIAGNOSIS — R262 Difficulty in walking, not elsewhere classified: Secondary | ICD-10-CM

## 2019-03-23 DIAGNOSIS — S91301A Unspecified open wound, right foot, initial encounter: Secondary | ICD-10-CM

## 2019-03-23 NOTE — Therapy (Signed)
[facility] [address] [place], [place], [address] Phone: [phone]   Fax:  [phone]  Wound Care Therapy  Patient Details  Name: Mitchell [name] MRN: [id number] Date of Birth: 09/20/1950 Referring Provider (PT): Nicholes Rough   Encounter Date: 03/23/2019  PT End of Session - 03/23/19 1242    Visit Number  22    Number of Visits  26    Date for PT Re-Evaluation  04/06/19    Authorization Type  [employer]    Authorization Time Period  POC dates 01/21/19 to 03/18/2019; NEW POC DATES 03/02/19 to 04/06/2019, RECERT and PN performed on visit 16 - due on visit 26    Authorization - Visit Number  6    Authorization - Number of Visits  10    PT Start Time  1130    PT Stop Time  1215    PT Time Calculation (min)  45 min    Activity Tolerance  Patient tolerated treatment well;No increased pain    Behavior During Therapy  WFL for tasks assessed/performed       Past Medical History:  Diagnosis Date  . Arthritis   . Cellulitis of right lower extremity 05/09/2014  . Diabetes mellitus without complication (HCC)   . Diabetic foot ulcer (HCC) 07/07/2014   Status post bedside debridement of multiple right plantar ulcers by podiatrist, Dr. Reynolds Bowl.  . Hypercholesterolemia   . Maggot infestation    right foot ulcer  . Obesity 05/09/2014  . Tobacco abuse 07/07/2014    Past Surgical History:  Procedure Laterality Date  . APPENDECTOMY    . PERIPHERAL VASCULAR CATHETERIZATION N/A 07/22/2015   Procedure: Abdominal Aortogram w/Lower Extremity;  Surgeon: Sherren Kerns, MD;  Location: [facility] INVASIVE CV LAB;  Service: Cardiovascular;  Laterality: N/A;    There were no vitals filed for this visit.     [facility] PT Assessment - 03/23/19 0001      Assessment   Medical Diagnosis  diabetic ulcer of toe of right foot     Referring Provider (PT)  Nicholes Rough    Onset Date/Surgical Date  12/15/18              Wound Therapy - 03/23/19 1243    Subjective   Patient reports he is doing well, the MD was very satisfied with his current wound presentation. States his right shoulder is still a little sore.     Patient and Family Stated Goals  for incision/wound to heal    Date of Onset  12/15/18    Prior Treatments  betadine, wet to dry dressings, amputation of thrid toe on R foot    Pain Scale  Faces    Pain Score  2     Pain Location  Leg    Pain Orientation  Lower;Posterior    Evaluation and Treatment Procedures Explained to Patient/Family  Yes    Evaluation and Treatment Procedures  agreed to    Wound Properties Date First Assessed: 01/21/19 Time First Assessed: 0915 Wound Type: Diabetic ulcer;Incision - Dehisced Location: Foot Location Orientation: Right;Distal , at base of where third toe would be  Wound Description (Comments): incision site between second and forth toes on right foot Present on Admission: Yes   Dressing Type  Foam - Lift dressing to assess site every shift;Compression wrap;Impregnated gauze (bismuth);Silver hydrofiber    Dressing Changed  Changed    Dressing Status  Old drainage    Dressing Change Frequency  PRN  Site / Wound Assessment  Granulation tissue;Pale;Pink    % Wound base Red or Granulating  100%    % Wound base Yellow/Fibrinous Exudate  0%    Peri-wound Assessment  Induration;Hemosiderin;Edema    Wound Length (cm)  0.9 cm   was1.2   Wound Width (cm)  0.3 cm   was .4   Wound Depth (cm)  0.3 cm   was .5   Wound Volume (cm^3)  0.08 cm^3    Wound Surface Area (cm^2)  0.27 cm^2    Margins  Unattached edges (unapproximated)    Drainage Amount  Scant    Drainage Description  Serosanguineous    Treatment  Cleansed;Debridement (Selective)    Wound Properties Date First Assessed: 03/23/19 Time First Assessed: 1140 Wound Type: Other (Comment) , blister  Location: Leg Location Orientation: Right;Posterior;Lower Wound Description (Comments): popped blister on posterior aspect of leg Present on Admission: No   Dressing  Type  Impregnated gauze (bismuth);Compression wrap    Dressing Changed  New    Dressing Change Frequency  PRN    % Wound base Red or Granulating  60%    % Wound base Other/Granulation Tissue (Comment)  40%    Peri-wound Assessment  Edema;Induration;Hemosiderin    Wound Length (cm)  3.2 cm    Wound Width (cm)  1.6 cm    Wound Depth (cm)  0 cm    Wound Volume (cm^3)  0 cm^3    Wound Surface Area (cm^2)  5.12 cm^2    Drainage Amount  None    Treatment  Cleansed    Selective Debridement - Location  distal right foot between 2nd and 4th toes    Selective Debridement - Tools Used  Forceps    Selective Debridement - Tissue Removed  slough, devitalized tissue    Wound Therapy - Clinical Statement  Measurements taken of posterior blister as it does not seem to be improving with conservative treatment of xeroform and profore. Messaged MD about order to debride this area to promote optimal healing. Measured this and added to note to monitor wound and ensure proper healing. One spot on dorsum of right foot that looks to be from bandage irritation. Carefully wrapped foot so pressure of bandage was not on this spot. Actual amputation site is healing very well. Will continue to benefit from skilled physical therapy to promote optimal wound healing.     Wound Therapy - Functional Problem List  pain with walking, standing and sleeping    Factors Delaying/Impairing Wound Healing  Diabetes Mellitus;Vascular compromise;Multiple medical problems    Hydrotherapy Plan  Debridement;Pulsatile lavage with suction    Wound Therapy - Frequency  2X / week    Wound Therapy - Current Recommendations  PT    Wound Plan  continue with appropriate woundcare to promote wound closure. MRI scheduled for 3/19.  Measure weekly.    Dressing   silver hydrofiber packed into wound bed, alginate between toe. Kerlix around toes to secure dressings then profore with foam for added compression and to decrease bandage from sliding down.                  PT Short Term Goals - 02/17/19 1537      PT SHORT TERM GOAL #1   Title  PT wound to be 50% granulated    Baseline  both wounds > 50%    Time  4    Period  Weeks    Status  Achieved    Target  Date  02/18/19      PT SHORT TERM GOAL #2   Title  Patient will demonstrate at least 50% improvement in swelling in toes per visual inspection by PT    Baseline  Improved but still swollen    Time  4    Period  Weeks    Status  On-going    Target Date  02/18/19        PT Long Term Goals - 02/17/19 1537      PT LONG TERM GOAL #1   Title  Patient will report no more then 2/10 pain in his foot to improve QOL.    Baseline  current pan 0/10    Time  8    Period  Weeks    Status  Achieved      PT LONG TERM GOAL #2   Title  Wound will be 100% granulated    Baseline  one wound 50% one wound 85%    Time  8    Period  Weeks    Status  On-going            Plan - 03/23/19 1243    Clinical Impression Statement  see above    Personal Factors and Comorbidities  Age;Comorbidity 1;Comorbidity 2;Fitness    Examination-Activity Limitations  Bathing;Dressing;Hygiene/Grooming;Locomotion Level;Stand;Stairs;Squat;Sleep;Transfers    Examination-Participation Restrictions  Cleaning;Driving;Community Activity    Stability/Clinical Decision Making  Evolving/Moderate complexity    Rehab Potential  Good    PT Frequency  2x / week    PT Duration  --   5 weeks   PT Treatment/Interventions  Other (comment);Compression bandaging;Scar mobilization;Patient/family education;Manual techniques   wound debridment, pulse lavage   PT Next Visit Plan  continue with POC; f/u with patient about MD apt    Consulted and Agree with Plan of Care  Patient       Patient will benefit from skilled therapeutic intervention in order to improve the following deficits and impairments:  Abnormal gait, Decreased activity tolerance, Decreased balance, Decreased endurance, Decreased knowledge of  precautions, Decreased mobility, Decreased range of motion, Difficulty walking, Pain, Other (comment), Increased edema, Impaired sensation, Decreased skin integrity(wound)  Visit Diagnosis: Difficulty in walking, not elsewhere classified  Open wound of right foot, initial encounter  Amputation of toe of right foot [facility])     Problem List Patient Active Problem List   Diagnosis Date Noted  . Closed fracture of proximal end of right humerus with routine healing 02/25/2019  . Pre-ulcerative calluses 04/20/2018  . 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 Diabetic foot ulcer associated with type 2 diabetes mellitus ([place]) 07/07/2014  . Tobacco abuse 07/07/2014  . Infestation, maggots 07/07/2014  . Plantar ulcer of right foot ([place]) 05/12/2014  . Pulse weakness   . Peripheral neuropathy   . Cellulitis of right lower extremity 05/09/2014  . Obesity 05/09/2014  . Cellulitis of right lower leg 05/09/2014  . Hyperglycemia 03/21/2013  . IDDM (insulin dependent diabetes mellitus) 03/21/2013  . Noncompliance with medications due to cost issues 03/21/2013   12:51 PM, 03/23/19 Jerene Pitch, DPT Physical Therapy with [facility]  [phone] office  [place] [address] [place], [place], [address] Phone: [phone]   Fax:  [phone]  Name: Lennell Shanks MRN: [id number] Date of Birth: September 18, 1950

## 2019-03-24 ENCOUNTER — Telehealth: Payer: Self-pay | Admitting: *Deleted

## 2019-03-24 DIAGNOSIS — T148XXA Other injury of unspecified body region, initial encounter: Secondary | ICD-10-CM

## 2019-03-24 NOTE — Telephone Encounter (Signed)
Mitchell [name] PT - [name] transferred to [facility]. Loletha Grayer, DPT. [name], DPT states send through referral with orders to debrided blister site of right lower leg.

## 2019-03-24 NOTE — Telephone Encounter (Signed)
-----   Message from Candelaria Stagers, DPM sent at 03/23/2019  2:00 PM EST ----- Yes you can debride the wound and continue the wound care as needed. I am not sure if this message counts as written permission. I will have one of my office staff put it in the system.  Thanks  Nicholes Rough ----- Message ----- From: Aletha Halim, PT Sent: 03/23/2019  12:35 PM EST To: Candelaria Stagers, DPM  Hello Dr. Allena Katz  Sorry to bother you, but I was wondering if you could put in an order in the system for our mutual patient for wound care for his right lower leg. He has a blister on the posterior aspect of his upper calf that is not healing with conservative dressings. I think the spot needs to be debrided but we are not allowed to debride a wound without a written order by the doctor.   Otherwise his actual amputation site is healing nicely and shouldn't be too much longer until it is completely healed.   Thank you so much for your time.  12:38 PM, 03/23/19 Tereasa Coop, DPT Physical Therapy with [facility]  [phone] office

## 2019-03-26 ENCOUNTER — Ambulatory Visit (HOSPITAL_COMMUNITY): Payer: Medicare HMO | Admitting: Physical Therapy

## 2019-03-26 ENCOUNTER — Ambulatory Visit (INDEPENDENT_AMBULATORY_CARE_PROVIDER_SITE_OTHER): Payer: Medicare HMO

## 2019-03-26 ENCOUNTER — Other Ambulatory Visit: Payer: Self-pay

## 2019-03-26 ENCOUNTER — Ambulatory Visit: Payer: Medicare HMO | Admitting: Orthopaedic Surgery

## 2019-03-26 ENCOUNTER — Encounter: Payer: Self-pay | Admitting: Orthopaedic Surgery

## 2019-03-26 DIAGNOSIS — S91301A Unspecified open wound, right foot, initial encounter: Secondary | ICD-10-CM | POA: Diagnosis not present

## 2019-03-26 DIAGNOSIS — S98131A Complete traumatic amputation of one right lesser toe, initial encounter: Secondary | ICD-10-CM | POA: Diagnosis not present

## 2019-03-26 DIAGNOSIS — S42291D Other displaced fracture of upper end of right humerus, subsequent encounter for fracture with routine healing: Secondary | ICD-10-CM

## 2019-03-26 DIAGNOSIS — R262 Difficulty in walking, not elsewhere classified: Secondary | ICD-10-CM | POA: Diagnosis not present

## 2019-03-26 NOTE — Therapy (Signed)
[facility] [facility] [address] [place], [place], [address] Phone: [phone]   Fax:  [phone]  Wound Care Therapy  Patient Details  Name: Mitchell [name] MRN: [id number] Date of Birth: August 11, 1950 Referring Provider (PT): Nicholes Rough   Encounter Date: 03/26/2019  PT End of Session - 03/26/19 1230    Visit Number  23    Number of Visits  26    Date for PT Re-Evaluation  04/06/19    Authorization Type  [employer] Medicare    Authorization Time Period  POC dates 01/21/19 to 03/18/2019; NEW POC DATES 03/02/19 to 04/06/2019, RECERT and PN performed on visit 16 - due on visit 26    Authorization - Visit Number  23    Authorization - Number of Visits  26    PT Start Time  1140    PT Stop Time  1220    PT Time Calculation (min)  40 min       Past Medical History:  Diagnosis Date  . Arthritis   . Cellulitis of right lower extremity 05/09/2014  . Diabetes mellitus without complication (HCC)   . Diabetic foot ulcer (HCC) 07/07/2014   Status post bedside debridement of multiple right plantar ulcers by podiatrist, Dr. Reynolds Bowl.  . Hypercholesterolemia   . Maggot infestation    right foot ulcer  . Obesity 05/09/2014  . Tobacco abuse 07/07/2014    Past Surgical History:  Procedure Laterality Date  . APPENDECTOMY    . PERIPHERAL VASCULAR CATHETERIZATION N/A 07/22/2015   Procedure: Abdominal Aortogram w/Lower Extremity;  Surgeon: Sherren Kerns, MD;  Location: [facility] INVASIVE CV LAB;  Service: Cardiovascular;  Laterality: N/A;    There were no vitals filed for this visit.              Wound Therapy - 03/26/19 1225    Subjective  PT states he has no pain feeling well, hopes to be done with therapy soon so he can take a shower.     Patient and Family Stated Goals  for incision/wound to heal    Date of Onset  12/15/18    Prior Treatments  betadine, wet to dry dressings, amputation of thrid toe on R foot    Pain Scale  0-10    Pain Score  0-No pain    Evaluation and Treatment Procedures Explained to Patient/Family  Yes    Evaluation and Treatment Procedures  agreed to    Wound Properties Date First Assessed: 01/21/19 Time First Assessed: 0915 Wound Type: Diabetic ulcer;Incision - Dehisced Location: Foot Location Orientation: Right;Distal , at base of where third toe would be  Wound Description (Comments): incision site between second and forth toes on right foot Present on Admission: Yes   Dressing Type  Foam - Lift dressing to assess site every shift;Compression wrap;Impregnated gauze (bismuth);Silver hydrofiber    Dressing Changed  Changed    Dressing Status  Old drainage    Dressing Change Frequency  PRN    Site / Wound Assessment  Granulation tissue;Pale;Pink    % Wound base Red or Granulating  100%    % Wound base Yellow/Fibrinous Exudate  0%    Peri-wound Assessment  Induration;Hemosiderin;Edema    Margins  Unattached edges (unapproximated)    Drainage Amount  Scant    Drainage Description  Serosanguineous    Treatment  Cleansed;Debridement (Selective)    Wound Properties Date First Assessed: 03/23/19 Time First Assessed: 1140 Wound Type: Other (Comment) , blister  Location: Leg  Location Orientation: Right;Posterior;Lower Wound Description (Comments): popped blister on posterior aspect of leg Present on Admission: No   Dressing Type  Impregnated gauze (bismuth);Compression wrap    Dressing Changed  Changed    Dressing Change Frequency  PRN    % Wound base Red or Granulating  40%    % Wound base Yellow/Fibrinous Exudate  60%    Peri-wound Assessment  Edema;Induration;Hemosiderin    Wound Length (cm)  2.7 cm    Wound Width (cm)  1.5 cm    Wound Surface Area (cm^2)  4.05 cm^2    Drainage Amount  None    Selective Debridement - Location  posterior aspect of RT calf     Selective Debridement - Tools Used  Forceps;Scissors    Selective Debridement - Tissue Removed  fibrinous tissue     Wound Therapy - Clinical Statement  Pt has no  pain except with pressure to posterior calf wound.  Clinic recieved oreders to allow debridement of this tissue.  Tissue is fibrinous and difficult to debride.  Wound at amputation site continues to be clean and approximate.     Wound Therapy - Functional Problem List  pain with walking, standing and sleeping    Factors Delaying/Impairing Wound Healing  Diabetes Mellitus;Vascular compromise;Multiple medical problems    Hydrotherapy Plan  Debridement;Pulsatile lavage with suction    Wound Therapy - Frequency  2X / week    Wound Therapy - Current Recommendations  PT    Wound Plan  continue with appropriate woundcare to promote wound closure. MRI scheduled for 3/19.  Measure weekly.    Dressing   silver hydrofiber packed into wound bed, alginate between toe. Kerlix around toes to secure dressings then profore with foam for added compression and to decrease bandage from sliding down.                 PT Short Term Goals - 02/17/19 1537      PT SHORT TERM GOAL #1   Title  PT wound to be 50% granulated    Baseline  both wounds > 50%    Time  4    Period  Weeks    Status  Achieved    Target Date  02/18/19      PT SHORT TERM GOAL #2   Title  Patient will demonstrate at least 50% improvement in swelling in toes per visual inspection by PT    Baseline  Improved but still swollen    Time  4    Period  Weeks    Status  On-going    Target Date  02/18/19        PT Long Term Goals - 02/17/19 1537      PT LONG TERM GOAL #1   Title  Patient will report no more then 2/10 pain in his foot to improve QOL.    Baseline  current pan 0/10    Time  8    Period  Weeks    Status  Achieved      PT LONG TERM GOAL #2   Title  Wound will be 100% granulated    Baseline  one wound 50% one wound 85%    Time  8    Period  Weeks    Status  On-going            Plan - 03/26/19 1231    Clinical Impression Statement  see above    Personal Factors and Comorbidities  Age;Comorbidity  1;Comorbidity 2;Fitness  Examination-Activity Limitations  Bathing;Dressing;Hygiene/Grooming;Locomotion Level;Stand;Stairs;Squat;Sleep;Transfers    Examination-Participation Restrictions  Cleaning;Driving;Community Activity    Stability/Clinical Decision Making  Evolving/Moderate complexity    Rehab Potential  Good    PT Frequency  2x / week    PT Duration  --   5 weeks   PT Treatment/Interventions  Other (comment);Compression bandaging;Scar mobilization;Patient/family education;Manual techniques   wound debridment, pulse lavage   PT Next Visit Plan  continue with POC; f/u with patient about MD apt    Consulted and Agree with Plan of Care  Patient       Patient will benefit from skilled therapeutic intervention in order to improve the following deficits and impairments:  Abnormal gait, Decreased activity tolerance, Decreased balance, Decreased endurance, Decreased knowledge of precautions, Decreased mobility, Decreased range of motion, Difficulty walking, Pain, Other (comment), Increased edema, Impaired sensation, Decreased skin integrity(wound)  Visit Diagnosis: Difficulty in walking, not elsewhere classified  Open wound of right foot, initial encounter  Amputation of toe of right foot [facility])     Problem List Patient Active Problem List   Diagnosis Date Noted  . Closed fracture of proximal end of right humerus with routine healing 02/25/2019  . Pre-ulcerative calluses 04/20/2018  . 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 Diabetic foot ulcer associated with type 2 diabetes mellitus (HCC) 07/07/2014  . Tobacco abuse 07/07/2014  . Infestation, maggots 07/07/2014  . Plantar ulcer of right foot (HCC) 05/12/2014  . Pulse weakness   . Peripheral neuropathy   . Cellulitis of right lower extremity 05/09/2014  . Obesity 05/09/2014  . Cellulitis of right lower leg 05/09/2014  . Hyperglycemia 03/21/2013  . IDDM (insulin dependent diabetes mellitus) 03/21/2013  . Noncompliance with medications due to  cost issues 03/21/2013   Virgina Organ, PT CLT [phone] 03/26/2019, 12:32 PM  [place] [address] [place], [place], [address] Phone: [phone]   Fax:  [phone]  Name: Mitchell [name] MRN: [id number] Date of Birth: August 30, 1950

## 2019-03-26 NOTE — Progress Notes (Signed)
The patient is about 6 weeks into a proximal humerus fracture of the right shoulder.  He is 69 years old and a chronic smoker.  He reports that he has been getting around okay with the right shoulder.  He states his right hand is swelling but has been wearing a sling pretty regularly.  On exam his right arm and hand are swollen but I believe it is from being in the sling too much.  I take him out of the sling and put him through some gentle motion and rotation of the right shoulder.  It is well located.  3 views of the right shoulder show that the proximal humerus fracture continues to show just minimal displacement especially with the greater tuberosity piece that is still minimally displaced and in a good position for healing.  He understands that his bone healing will be slower given his age and smoking history.  However, I do feel it is going do well with time and does not need any other intervention.  At this point I will have him stop wearing a sling.  I would like to see him back for a final visit in about 4 weeks with a single AP view of the right shoulder.

## 2019-03-31 ENCOUNTER — Ambulatory Visit (HOSPITAL_COMMUNITY): Payer: Medicare HMO | Admitting: Physical Therapy

## 2019-03-31 ENCOUNTER — Other Ambulatory Visit: Payer: Self-pay

## 2019-03-31 ENCOUNTER — Encounter (HOSPITAL_COMMUNITY): Payer: Self-pay | Admitting: Physical Therapy

## 2019-03-31 DIAGNOSIS — S98131A Complete traumatic amputation of one right lesser toe, initial encounter: Secondary | ICD-10-CM

## 2019-03-31 DIAGNOSIS — R262 Difficulty in walking, not elsewhere classified: Secondary | ICD-10-CM

## 2019-03-31 DIAGNOSIS — S91301A Unspecified open wound, right foot, initial encounter: Secondary | ICD-10-CM

## 2019-03-31 NOTE — Therapy (Signed)
[facility] [address] [place], [place], [address] Phone: [phone]   Fax:  [phone]  Wound Care Therapy  Patient Details  Name: Mitchell [name] MRN: [id number] Date of Birth: February 08, 1950 Referring Provider (PT): Nicholes Rough   Encounter Date: 03/31/2019  PT End of Session - 03/31/19 1230    Visit Number  24    Number of Visits  26    Date for PT Re-Evaluation  04/06/19    Authorization Type  [employer] Medicare    Authorization Time Period  POC dates 01/21/19 to 03/18/2019; NEW POC DATES 03/02/19 to 04/06/2019, RECERT and PN performed on visit 16 - due on visit 26    Authorization - Visit Number  24    Authorization - Number of Visits  26    PT Start Time  1130    PT Stop Time  1210    PT Time Calculation (min)  40 min    Activity Tolerance  Patient tolerated treatment well;No increased pain    Behavior During Therapy  WFL for tasks assessed/performed       Past Medical History:  Diagnosis Date  . Arthritis   . Cellulitis of right lower extremity 05/09/2014  . Diabetes mellitus without complication (HCC)   . Diabetic foot ulcer (HCC) 07/07/2014   Status post bedside debridement of multiple right plantar ulcers by podiatrist, Dr. Reynolds Bowl.  . Hypercholesterolemia   . Maggot infestation    right foot ulcer  . Obesity 05/09/2014  . Tobacco abuse 07/07/2014    Past Surgical History:  Procedure Laterality Date  . APPENDECTOMY    . PERIPHERAL VASCULAR CATHETERIZATION N/A 07/22/2015   Procedure: Abdominal Aortogram w/Lower Extremity;  Surgeon: Sherren Kerns, MD;  Location: [facility] INVASIVE CV LAB;  Service: Cardiovascular;  Laterality: N/A;    There were no vitals filed for this visit.     [facility] PT Assessment - 03/31/19 0001      Assessment   Medical Diagnosis  diabetic ulcer of toe of right foot     Referring Provider (PT)  Nicholes Rough    Onset Date/Surgical Date  12/15/18              Wound Therapy - 03/31/19 1231    Subjective   Patient reports he is eager to take a shower and have his foot heal. States the back of his leg is not as painful and he has been trying to keep the pressure off the area.    Patient and Family Stated Goals  for incision/wound to heal    Date of Onset  12/15/18    Prior Treatments  betadine, wet to dry dressings, amputation of thrid toe on R foot    Pain Scale  0-10    Pain Score  0-No pain    Wound Properties Date First Assessed: 03/23/19 Time First Assessed: 1140 Wound Type: Other (Comment) , blister  Location: Leg Location Orientation: Right;Posterior;Lower Wound Description (Comments): popped blister on posterior aspect of leg Present on Admission: No   Dressing Type  Impregnated gauze (bismuth);Compression wrap    Dressing Changed  Changed    Dressing Status  Old drainage    Dressing Change Frequency  PRN    % Wound base Red or Granulating  50%    % Wound base Yellow/Fibrinous Exudate  50%    Peri-wound Assessment  Edema;Induration;Hemosiderin    Drainage Amount  Minimal    Drainage Description  Serosanguineous    Treatment  Cleansed;Debridement (  Selective)    Wound Properties Date First Assessed: 01/21/19 Time First Assessed: 0915 Wound Type: Diabetic ulcer;Incision - Dehisced Location: Foot Location Orientation: Right;Distal , at base of where third toe would be  Wound Description (Comments): incision site between second and forth toes on right foot Present on Admission: Yes   Dressing Type  Foam - Lift dressing to assess site every shift;Compression wrap;Silver hydrofiber;Alginate    Dressing Changed  Changed    Dressing Status  Old drainage    Dressing Change Frequency  PRN    % Wound base Red or Granulating  100%    % Wound base Yellow/Fibrinous Exudate  0%    Peri-wound Assessment  Induration;Hemosiderin;Edema    Margins  Unattached edges (unapproximated)    Drainage Amount  Scant    Drainage Description  Serosanguineous    Treatment  Cleansed;Debridement (Selective)     Selective Debridement - Location  posterior aspect of RT calf     Selective Debridement - Tools Used  Forceps;Scalpel;Scissors    Selective Debridement - Tissue Removed  fibrinous tissue     Wound Therapy - Clinical Statement  Patient tolerated session very well. Amputation site nearly healed. Scant drainage noted. Blister on back of leg improved in granulation tissue, still fibrinous in nature. Continued with xeroform at this posterior site, silver hydrofiber at amputation site and profore with compression. Patient would continue to benefit from skilled physical therapy to promote optimal wound healing.     Wound Therapy - Functional Problem List  pain with walking, standing and sleeping    Factors Delaying/Impairing Wound Healing  Diabetes Mellitus;Vascular compromise;Multiple medical problems    Hydrotherapy Plan  Debridement;Pulsatile lavage with suction    Wound Therapy - Frequency  2X / week    Wound Therapy - Current Recommendations  PT    Wound Plan  continue with appropriate woundcare to promote wound closure. MRI scheduled for 3/19.  Measure weekly.    Dressing   xeroform on blister. silver hydrofiber packed into wound bed, alginate between toe. Kerlix around toes to secure dressings then profore with foam for added compression and to decrease bandage from sliding down.                 PT Short Term Goals - 02/17/19 1537      PT SHORT TERM GOAL #1   Title  PT wound to be 50% granulated    Baseline  both wounds > 50%    Time  4    Period  Weeks    Status  Achieved    Target Date  02/18/19      PT SHORT TERM GOAL #2   Title  Patient will demonstrate at least 50% improvement in swelling in toes per visual inspection by PT    Baseline  Improved but still swollen    Time  4    Period  Weeks    Status  On-going    Target Date  02/18/19        PT Long Term Goals - 02/17/19 1537      PT LONG TERM GOAL #1   Title  Patient will report no more then 2/10 pain in his foot  to improve QOL.    Baseline  current pan 0/10    Time  8    Period  Weeks    Status  Achieved      PT LONG TERM GOAL #2   Title  Wound will be 100% granulated  Baseline  one wound 50% one wound 85%    Time  8    Period  Weeks    Status  On-going            Plan - 03/31/19 1231    Clinical Impression Statement  see above    Personal Factors and Comorbidities  Age;Comorbidity 1;Comorbidity 2;Fitness    Examination-Activity Limitations  Bathing;Dressing;Hygiene/Grooming;Locomotion Level;Stand;Stairs;Squat;Sleep;Transfers    Examination-Participation Restrictions  Cleaning;Driving;Community Activity    Stability/Clinical Decision Making  Evolving/Moderate complexity    Rehab Potential  Good    PT Frequency  2x / week    PT Duration  --   5 weeks   PT Treatment/Interventions  Other (comment);Compression bandaging;Scar mobilization;Patient/family education;Manual techniques   wound debridment, pulse lavage   PT Next Visit Plan  continue with POC    Consulted and Agree with Plan of Care  Patient       Patient will benefit from skilled therapeutic intervention in order to improve the following deficits and impairments:  Abnormal gait, Decreased activity tolerance, Decreased balance, Decreased endurance, Decreased knowledge of precautions, Decreased mobility, Decreased range of motion, Difficulty walking, Pain, Other (comment), Increased edema, Impaired sensation, Decreased skin integrity(wound)  Visit Diagnosis: Difficulty in walking, not elsewhere classified  Open wound of right foot, initial encounter  Amputation of toe of right foot [facility])     Problem List Patient Active Problem List   Diagnosis Date Noted  . Closed fracture of proximal end of right humerus with routine healing 02/25/2019  . Pre-ulcerative calluses 04/20/2018  . 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 Diabetic foot ulcer associated with type 2 diabetes mellitus ([place]) 07/07/2014  . Tobacco abuse 07/07/2014  .  Infestation, maggots 07/07/2014  . Plantar ulcer of right foot ([place]) 05/12/2014  . Pulse weakness   . Peripheral neuropathy   . Cellulitis of right lower extremity 05/09/2014  . Obesity 05/09/2014  . Cellulitis of right lower leg 05/09/2014  . Hyperglycemia 03/21/2013  . IDDM (insulin dependent diabetes mellitus) 03/21/2013  . Noncompliance with medications due to cost issues 03/21/2013    Eliezer Champagne 03/31/2019, 12:39 PM  [place] [address] [place], [place], [address] Phone: [phone]   Fax:  [phone]  Name: Mitchell [name] MRN: [id number] Date of Birth: 06-03-1950

## 2019-04-02 ENCOUNTER — Other Ambulatory Visit: Payer: Self-pay

## 2019-04-02 ENCOUNTER — Ambulatory Visit (HOSPITAL_COMMUNITY): Payer: Medicare HMO | Admitting: Physical Therapy

## 2019-04-02 DIAGNOSIS — R262 Difficulty in walking, not elsewhere classified: Secondary | ICD-10-CM

## 2019-04-02 DIAGNOSIS — S98131A Complete traumatic amputation of one right lesser toe, initial encounter: Secondary | ICD-10-CM

## 2019-04-02 DIAGNOSIS — S91301A Unspecified open wound, right foot, initial encounter: Secondary | ICD-10-CM

## 2019-04-02 NOTE — Therapy (Signed)
[place], [place], [address] Phone: [phone]   Fax:  [phone]  Wound Care Therapy  Patient Details  Name: Mitchell [name] MRN: [id number] Date of Birth: 17-May-1950 Referring Provider (PT): [name]   Encounter Date: 04/02/2019  PT End of Session - 04/02/19 1602    Visit Number  25    Number of Visits  26    Date for PT Re-Evaluation  04/06/19    Authorization Type  [employer]na Medicare    Authorization Time Period  POC dates 01/21/19 to 03/18/2019; NEW POC DATES 03/02/19 to 07/11/348, RECERT and PN performed on visit 16 - due on visit 26    Authorization - Visit Number  25    Authorization - Number of Visits  26    PT Start Time  1325    PT Stop Time  1405    PT Time Calculation (min)  40 min    Activity Tolerance  Patient tolerated treatment well;No increased pain    Behavior During Therapy  WFL for tasks assessed/performed       Past Medical History:  Diagnosis Date  . Arthritis   . Cellulitis of right lower extremity 05/09/2014  . Diabetes mellitus without complication ([place])   . Diabetic foot ulcer ([place]) 07/07/2014   Status post bedside debridement of multiple right plantar ulcers by podiatrist, Dr. Laurena Spies.  . Hypercholesterolemia   . Maggot infestation    right foot ulcer  . Obesity 05/09/2014  . Tobacco abuse 07/07/2014    Past Surgical History:  Procedure Laterality Date  . APPENDECTOMY    . PERIPHERAL VASCULAR CATHETERIZATION N/A 07/22/2015   Procedure: Abdominal Aortogram w/Lower Extremity;  Surgeon: Elam Dutch, MD;  Location: [facility] CV LAB;  Service: Cardiovascular;  Laterality: N/A;    There were no vitals filed for this visit.              Wound Therapy - 04/02/19 1550    Subjective  Patient reports he is eager to take a shower and have his foot heal. States the back of his leg is not as painful and he has been trying to keep the pressure off the area.    Patient and Family  Stated Goals  for incision/wound to heal    Date of Onset  12/15/18    Prior Treatments  betadine, wet to dry dressings, amputation of thrid toe on R foot    Wound Properties Date First Assessed: 03/23/19 Time First Assessed: 1140 Wound Type: Other (Comment) , blister  Location: Leg Location Orientation: Right;Posterior;Lower Wound Description (Comments): popped blister on posterior aspect of leg Present on Admission: No   Dressing Type  Impregnated gauze (bismuth);Compression wrap    Dressing Changed  Changed    Dressing Status  Old drainage    Dressing Change Frequency  PRN    % Wound base Red or Granulating  50%    % Wound base Yellow/Fibrinous Exudate  50%    Peri-wound Assessment  Edema;Erythema (blanchable);Maceration    Drainage Amount  Minimal    Drainage Description  Serosanguineous    Treatment  Cleansed;Debridement (Selective)    Wound Properties Date First Assessed: 01/21/19 Time First Assessed: 0915 Wound Type: Diabetic ulcer;Incision - Dehisced Location: Foot Location Orientation: Right;Distal , at base of where third toe would be  Wound Description (Comments): incision site between second and forth toes on right foot Present on Admission: Yes   Dressing Type  Compression wrap;Silver hydrofiber;Alginate  Dressing Changed  Changed    Dressing Status  None    Dressing Change Frequency  PRN    % Wound base Red or Granulating  100%    % Wound base Yellow/Fibrinous Exudate  0%    Peri-wound Assessment  Intact    Margins  Attached edges (approximated)    Drainage Amount  None    Drainage Description  --    Treatment  Cleansed    Selective Debridement - Location  posterior aspect of RT calf     Selective Debridement - Tools Used  Forceps;Scissors    Selective Debridement - Tissue Removed  fibrinous tissue     Wound Therapy - Clinical Statement  No drainage or tissue to debride from initial wound and by all appearances looks to be healed.  There is a "dip" that remains, however  does not appear to be open or draining.  Posterior LE wound with more erthyemia perimeter and approx 0.2cm elevated above skin surface.  Wound with drainage and some maceration present.  Easily bleeds with debridement.  Changed dressed to silver hydrofiber to try to reduce moisture and compress back to skin surface.  continued with profore.    Wound Therapy - Functional Problem List  pain with walking, standing and sleeping    Factors Delaying/Impairing Wound Healing  Diabetes Mellitus;Vascular compromise;Multiple medical problems    Hydrotherapy Plan  Debridement;Pulsatile lavage with suction    Wound Therapy - Frequency  2X / week    Wound Therapy - Current Recommendations  PT    Wound Plan  continue with appropriate woundcare to promote wound closure. MRI scheduled for 3/19.  Measure weekly.    Dressing   silver hydrofiber dressings then profore with foam for added compression and to decrease bandage from sliding down.                 PT Short Term Goals - 02/17/19 1537      PT SHORT TERM GOAL #1   Title  PT wound to be 50% granulated    Baseline  both wounds > 50%    Time  4    Period  Weeks    Status  Achieved    Target Date  02/18/19      PT SHORT TERM GOAL #2   Title  Patient will demonstrate at least 50% improvement in swelling in toes per visual inspection by PT    Baseline  Improved but still swollen    Time  4    Period  Weeks    Status  On-going    Target Date  02/18/19        PT Long Term Goals - 02/17/19 1537      PT LONG TERM GOAL #1   Title  Patient will report no more then 2/10 pain in his foot to improve QOL.    Baseline  current pan 0/10    Time  8    Period  Weeks    Status  Achieved      PT LONG TERM GOAL #2   Title  Wound will be 100% granulated    Baseline  one wound 50% one wound 85%    Time  8    Period  Weeks    Status  On-going              Patient will benefit from skilled therapeutic intervention in order to improve the  following deficits and impairments:     Visit Diagnosis:  Amputation of toe of right foot (HCC)  Open wound of right foot, initial encounter  Difficulty in walking, not elsewhere classified     Problem List Patient Active Problem List   Diagnosis Date Noted  . Closed fracture of proximal end of right humerus with routine healing 02/25/2019  . Pre-ulcerative calluses 04/20/2018  . 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 Diabetic foot ulcer associated with type 2 diabetes mellitus (HCC) 07/07/2014  . Tobacco abuse 07/07/2014  . Infestation, maggots 07/07/2014  . Plantar ulcer of right foot (HCC) 05/12/2014  . Pulse weakness   . Peripheral neuropathy   . Cellulitis of right lower extremity 05/09/2014  . Obesity 05/09/2014  . Cellulitis of right lower leg 05/09/2014  . Hyperglycemia 03/21/2013  . IDDM (insulin dependent diabetes mellitus) 03/21/2013  . Noncompliance with medications due to cost issues 03/21/2013   Lurena Nida, PTA/CLT [phone]  Lurena Nida 04/02/2019, 4:04 PM  [place] [facility] [address] [place], [place], [address] Phone: [phone]   Fax:  [phone]  Name: Mitchell [name] MRN: [id number] Date of Birth: 21-Mar-1950

## 2019-04-03 ENCOUNTER — Ambulatory Visit (HOSPITAL_COMMUNITY): Payer: Medicare HMO

## 2019-04-03 ENCOUNTER — Encounter (HOSPITAL_COMMUNITY): Payer: Self-pay

## 2019-04-07 ENCOUNTER — Encounter (HOSPITAL_COMMUNITY): Payer: Self-pay | Admitting: Physical Therapy

## 2019-04-07 ENCOUNTER — Ambulatory Visit (HOSPITAL_COMMUNITY): Payer: Medicare HMO | Admitting: Physical Therapy

## 2019-04-07 ENCOUNTER — Other Ambulatory Visit: Payer: Self-pay

## 2019-04-07 DIAGNOSIS — S98131A Complete traumatic amputation of one right lesser toe, initial encounter: Secondary | ICD-10-CM | POA: Diagnosis not present

## 2019-04-07 DIAGNOSIS — R262 Difficulty in walking, not elsewhere classified: Secondary | ICD-10-CM

## 2019-04-07 DIAGNOSIS — S91301A Unspecified open wound, right foot, initial encounter: Secondary | ICD-10-CM

## 2019-04-07 NOTE — Therapy (Signed)
[facility] [address] [place], [place], [address] Phone: [phone]   Fax:  [phone]  Wound Care Therapy and Progress Note and RECERT  Patient Details  Name: Mitchell [name] MRN: [id number] Date of Birth: August 12, 1950 Referring Provider (PT): Nicholes Rough  Progress Note Reporting Period 03/02/19  to 04/07/19  See note below for Objective Data and Assessment of Progress/Goals.       Encounter Date: 04/07/2019  PT End of Session - 04/07/19 1332    Visit Number  26    Number of Visits  32    Date for PT Re-Evaluation  04/06/19    Authorization Type  A[employer]na Medicare    Authorization Time Period  POC dates 01/21/19 to 03/18/2019; NEW POC DATES 03/02/19 to 04/06/2019, RECERT and PN performed on visit 16 - due on visit 26    Progress Note Due on Visit  32    PT Start Time  1130    PT Stop Time  1230    PT Time Calculation (min)  60 min    Activity Tolerance  Patient tolerated treatment well;No increased pain    Behavior During Therapy  WFL for tasks assessed/performed       Past Medical History:  Diagnosis Date  . Arthritis   . Cellulitis of right lower extremity 05/09/2014  . Diabetes mellitus without complication (HCC)   . Diabetic foot ulcer (HCC) 07/07/2014   Status post bedside debridement of multiple right plantar ulcers by podiatrist, Dr. Reynolds Bowl.  . Hypercholesterolemia   . Maggot infestation    right foot ulcer  . Obesity 05/09/2014  . Tobacco abuse 07/07/2014    Past Surgical History:  Procedure Laterality Date  . APPENDECTOMY    . PERIPHERAL VASCULAR CATHETERIZATION N/A 07/22/2015   Procedure: Abdominal Aortogram w/Lower Extremity;  Surgeon: Sherren Kerns, MD;  Location: [facility] INVASIVE CV LAB;  Service: Cardiovascular;  Laterality: N/A;    There were no vitals filed for this visit.     [facility] PT Assessment - 04/07/19 0001      Assessment   Medical Diagnosis  diabetic ulcer of toe of right foot     Referring Provider (PT)   Nicholes Rough    Onset Date/Surgical Date  12/15/18              Wound Therapy - 04/07/19 1346    Subjective  Patient reports no foot/leg pain. States he really wants to take a shower.     Patient and Family Stated Goals  for incision/wound to heal    Date of Onset  12/15/18    Prior Treatments  betadine, wet to dry dressings, amputation of thrid toe on R foot    Pain Scale  0-10    Pain Score  0-No pain    Wound Properties Date First Assessed: 03/23/19 Time First Assessed: 1140 Wound Type: Other (Comment) , blister  Location: Leg Location Orientation: Right;Posterior;Lower Wound Description (Comments): popped blister on posterior aspect of leg Present on Admission: No   Dressing Type  Compression wrap;Gauze (Comment);Foam - Lift dressing to assess site every shift   with medihoney on gauze    Dressing Changed  Changed    Dressing Status  Old drainage    Dressing Change Frequency  PRN    % Wound base Red or Granulating  40%    % Wound base Other/Granulation Tissue (Comment)  60%    Peri-wound Assessment  Edema;Erythema (non-blanchable);Induration    Wound Length (cm)  3 cm   was 2.7   Wound Width (cm)  2.1 cm   was 1.5    Wound Depth (cm)  0 cm    Wound Volume (cm^3)  0 cm^3    Wound Surface Area (cm^2)  6.3 cm^2    Drainage Amount  None    Treatment  Debridement (Selective);Cleansed    Wound Properties Date First Assessed: 01/21/19 Time First Assessed: 0915 Wound Type: Diabetic ulcer;Incision - Dehisced Location: Foot Location Orientation: Right;Distal , at base of where third toe would be  Wound Description (Comments): incision site between second and forth toes on right foot Present on Admission: Yes Final Assessment Date: 04/07/19 Final Assessment Time: 1200   Selective Debridement - Location  posterior aspect of RT calf     Selective Debridement - Tools Used  Forceps;Scalpel    Selective Debridement - Tissue Removed  fibrinous tissue     Wound Therapy - Clinical Statement   Patient present for a reassessment on this date. He has made great progress with the amputation site completely healed. During the course of his treatment, fluid filled blisters formed over his lower leg(anterior and posterior part of leg) when the swelling in his leg drastically decreased between 2 treatment sessions. All but one spot healed with just xeroform. The blister on the posterior aspect of the upper calf did not heal and started to get irritated with the constant pressure applied to that exact spot of his leg with sitting in a recliner. Doctor sent order to debride this blister, and tissue is very fibrotic in nature. Today wound presented with firm scab-like covering with no drainage. Measured wound and wound is larger than previously but does seem to be healing. Patient to follow up with MD on 04/16/19. Will continue to treat wound to promote optimal wound healing. Extending POC additional 6 weeks to address posterior lower leg blister/wound.     Wound Therapy - Functional Problem List  pain with walking, standing and sleeping    Factors Delaying/Impairing Wound Healing  Diabetes Mellitus;Vascular compromise;Multiple medical problems    Hydrotherapy Plan  Debridement;Pulsatile lavage with suction    Wound Therapy - Frequency  2X / week    Wound Therapy - Current Recommendations  PT    Wound Plan  continue with appropriate woundcare patietn to see MD on 4/1    Dressing   guaze and medihoney on the posterior wound and alginate between toes then profore                PT Short Term Goals - 04/07/19 1337      PT SHORT TERM GOAL #1   Title  PT wound to be 50% granulated    Baseline  both wounds > 50%    Time  4    Period  Weeks    Status  Achieved    Target Date  02/18/19      PT SHORT TERM GOAL #2   Title  Patient will demonstrate at least 50% improvement in swelling in toes per visual inspection by PT    Baseline  Improved but still swollen    Time  4    Period  Weeks     Status  Achieved    Target Date  02/18/19        PT Long Term Goals - 04/07/19 1341      PT LONG TERM GOAL #1   Title  Patient will report no more then 2/10 pain in his foot  to improve QOL.    Baseline  current pan 0/10    Time  8    Period  Weeks    Status  Achieved      PT LONG TERM GOAL #2   Title  Wound will be 100% granulated    Baseline  one wound 100% healed and one wound 30% granulated    Time  8    Period  Weeks    Status  On-going            Plan - 04/07/19 1336    Clinical Impression Statement  see above    Personal Factors and Comorbidities  Age;Comorbidity 1;Comorbidity 2;Fitness    Examination-Activity Limitations  Bathing;Dressing;Hygiene/Grooming;Locomotion Level;Stand;Stairs;Squat;Sleep;Transfers    Examination-Participation Restrictions  Cleaning;Driving;Community Activity    Stability/Clinical Decision Making  Evolving/Moderate complexity    Rehab Potential  Good    PT Frequency  2x / week    PT Duration  6 weeks   5 weeks   PT Treatment/Interventions  Other (comment);Compression bandaging;Scar mobilization;Patient/family education;Manual techniques   wound debridment, pulse lavage   PT Next Visit Plan  continue with POC - extending POC additional 6 weeks    Consulted and Agree with Plan of Care  Patient       Patient will benefit from skilled therapeutic intervention in order to improve the following deficits and impairments:  Abnormal gait, Decreased activity tolerance, Decreased balance, Decreased endurance, Decreased knowledge of precautions, Decreased mobility, Decreased range of motion, Difficulty walking, Pain, Other (comment), Increased edema, Impaired sensation, Decreased skin integrity(wound)  Visit Diagnosis: Amputation of toe of right foot (HCC)  Open wound of right foot, initial encounter  Difficulty in walking, not elsewhere classified     Problem List Patient Active Problem List   Diagnosis Date Noted  . Closed fracture of  proximal end of right humerus with routine healing 02/25/2019  . Pre-ulcerative calluses 04/20/2018  . 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 Diabetic foot ulcer associated with type 2 diabetes mellitus ([place]) 07/07/2014  . Tobacco abuse 07/07/2014  . Infestation, maggots 07/07/2014  . Plantar ulcer of right foot ([place]) 05/12/2014  . Pulse weakness   . Peripheral neuropathy   . Cellulitis of right lower extremity 05/09/2014  . Obesity 05/09/2014  . Cellulitis of right lower leg 05/09/2014  . Hyperglycemia 03/21/2013  . IDDM (insulin dependent diabetes mellitus) 03/21/2013  . Noncompliance with medications due to cost issues 03/21/2013    2:47 PM, 04/07/19 Jerene Pitch, DPT Physical Therapy with [facility]  [phone] office   [place] [address] [place], [place], [address] Phone: [phone]   Fax:  [phone]  Name: Mitchell [name] MRN: [id number] Date of Birth: 29-Nov-1950

## 2019-04-09 ENCOUNTER — Other Ambulatory Visit: Payer: Self-pay

## 2019-04-09 ENCOUNTER — Ambulatory Visit (HOSPITAL_COMMUNITY): Payer: Medicare HMO | Admitting: Physical Therapy

## 2019-04-09 ENCOUNTER — Encounter (HOSPITAL_COMMUNITY): Payer: Self-pay | Admitting: Physical Therapy

## 2019-04-09 DIAGNOSIS — R262 Difficulty in walking, not elsewhere classified: Secondary | ICD-10-CM | POA: Diagnosis not present

## 2019-04-09 DIAGNOSIS — S98131A Complete traumatic amputation of one right lesser toe, initial encounter: Secondary | ICD-10-CM | POA: Diagnosis not present

## 2019-04-09 DIAGNOSIS — S91301A Unspecified open wound, right foot, initial encounter: Secondary | ICD-10-CM

## 2019-04-09 NOTE — Therapy (Signed)
[facility] [address] [place], [place], [address] Phone: [phone]   Fax:  [phone]  Wound Care Therapy  Patient Details  Name: Mitchell [name] MRN: [id number] Date of Birth: 1950-08-05 Referring Provider (PT): Nicholes Rough   Encounter Date: 04/09/2019  PT End of Session - 04/09/19 1143    Visit Number  27    Number of Visits  32    Date for PT Re-Evaluation  05/19/19    Authorization Type  [employer] Medicare    Authorization Time Period  POC dates 01/21/19 to 03/18/2019; NEW POC DATES 03/02/19 to 04/06/2019, RECERT and PN performed on visit 26, NEW POC DATES 04/07/19 to 05/19/2019    Progress Note Due on Visit  32    PT Start Time  1046    PT Stop Time  1130    PT Time Calculation (min)  44 min    Activity Tolerance  Patient tolerated treatment well;No increased pain    Behavior During Therapy  WFL for tasks assessed/performed       Past Medical History:  Diagnosis Date  . Arthritis   . Cellulitis of right lower extremity 05/09/2014  . Diabetes mellitus without complication (HCC)   . Diabetic foot ulcer (HCC) 07/07/2014   Status post bedside debridement of multiple right plantar ulcers by podiatrist, Dr. Reynolds Bowl.  . Hypercholesterolemia   . Maggot infestation    right foot ulcer  . Obesity 05/09/2014  . Tobacco abuse 07/07/2014    Past Surgical History:  Procedure Laterality Date  . APPENDECTOMY    . PERIPHERAL VASCULAR CATHETERIZATION N/A 07/22/2015   Procedure: Abdominal Aortogram w/Lower Extremity;  Surgeon: Sherren Kerns, MD;  Location: [facility] INVASIVE CV LAB;  Service: Cardiovascular;  Laterality: N/A;    There were no vitals filed for this visit.     [facility] PT Assessment - 04/09/19 0001      Assessment   Medical Diagnosis  diabetic ulcer of toe of right foot     Referring Provider (PT)  Nicholes Rough    Onset Date/Surgical Date  12/15/18              Wound Therapy - 04/09/19 1145    Subjective  No pain. Removed dressing  this morning to shower. Then rewrapped the leg where wound was. States he put Neosporin on the wound so it didn't stick.     Patient and Family Stated Goals  for incision/wound to heal    Date of Onset  12/15/18    Prior Treatments  betadine, wet to dry dressings, amputation of thrid toe on R foot    Pain Scale  0-10    Pain Score  0-No pain    Evaluation and Treatment Procedures Explained to Patient/Family  Yes    Evaluation and Treatment Procedures  agreed to    Wound Properties Date First Assessed: 03/23/19 Time First Assessed: 1140 Wound Type: Other (Comment) , blister  Location: Leg Location Orientation: Right;Posterior;Lower Wound Description (Comments): popped blister on posterior aspect of leg Present on Admission: No   Dressing Type  Compression wrap;Impregnated gauze (bismuth);Gauze (Comment)   medihoney on gauze, #6 netting then cotton &coban no stretch   Dressing Changed  Changed    Dressing Status  Old drainage    Dressing Change Frequency  PRN    % Wound base Red or Granulating  50%    % Wound base Other/Granulation Tissue (Comment)  50%    Peri-wound Assessment  Edema;Erythema (non-blanchable);Induration  Wound Length (cm)  2.2 cm   was 3   Wound Width (cm)  2.2 cm   was 2.1    Wound Depth (cm)  0 cm    Wound Volume (cm^3)  0 cm^3    Wound Surface Area (cm^2)  4.84 cm^2    Drainage Amount  Scant    Drainage Description  Serosanguineous    Treatment  Cleansed;Debridement (Selective)    Selective Debridement - Location  posterior aspect of RT calf     Selective Debridement - Tools Used  Forceps    Selective Debridement - Tissue Removed  fibrous tissue and devitalized tisuse    Wound Therapy - Clinical Statement  Increased redness noted around wound today. With it extending 3.2 cm at the 4 o'clock position and around rest of wound bed but not greater then 3.2cm. Blood blister noted at 12 o'clock position measured today at 1.3cm Length and 2.1cm width. Focused on retrograde  edema massage secondary to induration and swelling. Redness noted along anterior shin as if compression too much. Changed dressing to lymphedema netting then cotton and coban with minimal stretch to decrease amount of compression and advised patient to elevate legs as much as possible. Xeroform to new spots on lower leg and continued with medihoney on gauze at posterior blister. Added additional visit this week to check tolerance to change in compression and monitor redness in leg. Patient will continue to benefit from skilled physical therapy to promote optimal wound healing.     Wound Therapy - Functional Problem List  pain with walking, standing and sleeping    Factors Delaying/Impairing Wound Healing  Diabetes Mellitus;Vascular compromise;Multiple medical problems    Hydrotherapy Plan  Debridement;Pulsatile lavage with suction    Wound Therapy - Frequency  3X / week    Wound Therapy - Current Recommendations  PT    Wound Plan  continue with appropriate woundcare patietn to see MD on 4/1    Dressing   gauze with medihoney on the posterior wound, netting #6 then cotton and coban with minimal to no stretch. xeroform on blister spots on lower leg                 PT Short Term Goals - 04/07/19 1337      PT SHORT TERM GOAL #1   Title  PT wound to be 50% granulated    Baseline  both wounds > 50%    Time  4    Period  Weeks    Status  Achieved    Target Date  02/18/19      PT SHORT TERM GOAL #2   Title  Patient will demonstrate at least 50% improvement in swelling in toes per visual inspection by PT    Baseline  Improved but still swollen    Time  4    Period  Weeks    Status  Achieved    Target Date  02/18/19        PT Long Term Goals - 04/07/19 1341      PT LONG TERM GOAL #1   Title  Patient will report no more then 2/10 pain in his foot to improve QOL.    Baseline  current pan 0/10    Time  8    Period  Weeks    Status  Achieved      PT LONG TERM GOAL #2   Title   Wound will be 100% granulated    Baseline  one wound 100%  healed and one wound 30% granulated    Time  8    Period  Weeks    Status  On-going            Plan - 04/09/19 1143    Clinical Impression Statement  see above    Personal Factors and Comorbidities  Age;Comorbidity 1;Comorbidity 2;Fitness    Examination-Activity Limitations  Bathing;Dressing;Hygiene/Grooming;Locomotion Level;Stand;Stairs;Squat;Sleep;Transfers    Examination-Participation Restrictions  Cleaning;Driving;Community Activity    Stability/Clinical Decision Making  Evolving/Moderate complexity    Rehab Potential  Good    PT Frequency  2x / week    PT Duration  6 weeks   5 weeks   PT Treatment/Interventions  Other (comment);Compression bandaging;Scar mobilization;Patient/family education;Manual techniques   wound debridment, pulse lavage   PT Next Visit Plan  continue with POC - additinal visit this week to follow up with erythema    Consulted and Agree with Plan of Care  Patient       Patient will benefit from skilled therapeutic intervention in order to improve the following deficits and impairments:  Abnormal gait, Decreased activity tolerance, Decreased balance, Decreased endurance, Decreased knowledge of precautions, Decreased mobility, Decreased range of motion, Difficulty walking, Pain, Other (comment), Increased edema, Impaired sensation, Decreased skin integrity(wound)  Visit Diagnosis: Amputation of toe of right foot (HCC)  Open wound of right foot, initial encounter  Difficulty in walking, not elsewhere classified     Problem List Patient Active Problem List   Diagnosis Date Noted  . Closed fracture of proximal end of right humerus with routine healing 02/25/2019  . Pre-ulcerative calluses 04/20/2018  . 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 Diabetic foot ulcer associated with type 2 diabetes mellitus (HCC) 07/07/2014  . Tobacco abuse 07/07/2014  . Infestation, maggots 07/07/2014  . Plantar ulcer of right  foot (HCC) 05/12/2014  . Pulse weakness   . Peripheral neuropathy   . Cellulitis of right lower extremity 05/09/2014  . Obesity 05/09/2014  . Cellulitis of right lower leg 05/09/2014  . Hyperglycemia 03/21/2013  . IDDM (insulin dependent diabetes mellitus) 03/21/2013  . Noncompliance with medications due to cost issues 03/21/2013   11:58 AM, 04/09/19 Tereasa Coop, DPT Physical Therapy with [facility]  [phone] office   [facility] [address] [place], [place], [address] Phone: [phone]   Fax:  [phone]  Name: Porfirio Bollier MRN: [id number] Date of Birth: [birth year]

## 2019-04-10 ENCOUNTER — Ambulatory Visit (HOSPITAL_COMMUNITY): Payer: Medicare HMO

## 2019-04-10 ENCOUNTER — Encounter (HOSPITAL_COMMUNITY): Payer: Self-pay

## 2019-04-10 DIAGNOSIS — R262 Difficulty in walking, not elsewhere classified: Secondary | ICD-10-CM | POA: Diagnosis not present

## 2019-04-10 DIAGNOSIS — S98131A Complete traumatic amputation of one right lesser toe, initial encounter: Secondary | ICD-10-CM | POA: Diagnosis not present

## 2019-04-10 DIAGNOSIS — S91301A Unspecified open wound, right foot, initial encounter: Secondary | ICD-10-CM

## 2019-04-10 NOTE — Therapy (Addendum)
[facility] [address] [place], [place], [address] Phone: [phone]   Fax:  [phone]  Wound Care Therapy   Patient Details  Name: Mitchell [name] MRN: [id number] Date of Birth: 10/06/1950 Referring Provider (PT): Nicholes Rough   Encounter Date: 04/10/2019  PT End of Session - 04/10/19 1039    Visit Number  28    Number of Visits  32    Date for PT Re-Evaluation  05/19/19    Authorization Type  [employer]na Medicare    Authorization Time Period  POC dates 01/21/19 to 03/18/2019; NEW POC DATES 03/02/19 to 04/06/2019, RECERT and PN performed on visit 26, NEW POC DATES 04/07/19 to 05/19/2019    Authorization - Visit Number     Authorization - Number of Visits    Progress Note Due on Visit  32    PT Start Time  0942    PT Stop Time  1030    PT Time Calculation (min)  48 min    Activity Tolerance  Patient tolerated treatment well;No increased pain    Behavior During Therapy  WFL for tasks assessed/performed       Past Medical History:  Diagnosis Date  . Arthritis   . Cellulitis of right lower extremity 05/09/2014  . Diabetes mellitus without complication (HCC)   . Diabetic foot ulcer (HCC) 07/07/2014   Status post bedside debridement of multiple right plantar ulcers by podiatrist, Dr. Reynolds Bowl.  . Hypercholesterolemia   . Maggot infestation    right foot ulcer  . Obesity 05/09/2014  . Tobacco abuse 07/07/2014    Past Surgical History:  Procedure Laterality Date  . APPENDECTOMY    . PERIPHERAL VASCULAR CATHETERIZATION N/A 07/22/2015   Procedure: Abdominal Aortogram w/Lower Extremity;  Surgeon: Sherren Kerns, MD;  Location: [facility] INVASIVE CV LAB;  Service: Cardiovascular;  Laterality: N/A;    There were no vitals filed for this visit.   Subjective Assessment - 04/10/19 1845    Subjective  Pt arrived wiht dressings intact.  No reports of pain today                Wound Therapy - 04/10/19 1845    Subjective  No pain. Removed dressing this  morning to shower. Then rewrapped the leg where wound was. States he put Neosporin on the wound so it didn't stick.     Patient and Family Stated Goals  for incision/wound to heal    Date of Onset  12/15/18    Prior Treatments  betadine, wet to dry dressings, amputation of thrid toe on R foot    Pain Scale  0-10    Pain Score  0-No pain    Evaluation and Treatment Procedures Explained to Patient/Family  Yes    Evaluation and Treatment Procedures  agreed to    Wound Properties Date First Assessed: 03/23/19 Time First Assessed: 1140 Wound Type: Other (Comment) , blister  Location: Leg Location Orientation: Right;Posterior;Lower Wound Description (Comments): popped blister on posterior aspect of leg Present on Admission: No   Dressing Type  Compression wrap;Impregnated gauze (bismuth);Gauze (Comment)   gauze with medihoney, stockette #6, cotton and coban with no   Dressing Changed  Changed    Dressing Change Frequency  PRN    % Wound base Red or Granulating  55%    % Wound base Black/Eschar  0%    % Wound base Other/Granulation Tissue (Comment)  45%    Peri-wound Assessment  Edema;Erythema (non-blanchable);Induration    Wound  Length (cm)  2.2 cm    Wound Width (cm)  2.2 cm    Wound Depth (cm)  0 cm    Wound Volume (cm^3)  0 cm^3    Wound Surface Area (cm^2)  4.84 cm^2    Drainage Amount  Scant    Drainage Description  Serosanguineous    Treatment  Cleansed;Debridement (Selective)    Selective Debridement - Location  posterior aspect of RT calf     Selective Debridement - Tools Used  Forceps;Scalpel    Selective Debridement - Tissue Removed  fibrous tissue and devitalized tisuse    Wound Therapy - Clinical Statement  Vast improvements with decreased in redness anterior shin as well as surrounding posterior wound.  Redness has reduced in color and extends to 1.8 cm at 4:00 from posterior wound.  Able to debride all brack from posterior wound wiht scalpel and forceps.  Continues with medihoney  on gauze and minimal compression bandage for edema control.    Wound Therapy - Functional Problem List  pain with walking, standing and sleeping    Factors Delaying/Impairing Wound Healing  Diabetes Mellitus;Vascular compromise;Multiple medical problems    Hydrotherapy Plan  Debridement;Pulsatile lavage with suction    Wound Therapy - Frequency  3X / week    Wound Therapy - Current Recommendations  PT    Wound Plan  continue with appropriate woundcare patietn to see MD on 4/1    Dressing   gauze with medihoney, stockette #6, cotton and coban with no stretch;  xerofrom to anterior shin                PT Short Term Goals - 04/10/19 1852      PT SHORT TERM GOAL #1   Title  PT wound to be 50% granulated    Baseline  both wounds > 50%    Status  Achieved      PT SHORT TERM GOAL #2   Title  Patient will demonstrate at least 50% improvement in swelling in toes per visual inspection by PT    Status  Achieved        PT Long Term Goals - 04/10/19 1852      PT LONG TERM GOAL #1   Title  Patient will report no more then 2/10 pain in his foot to improve QOL.    Baseline  current pan 0/10    Status  Achieved      PT LONG TERM GOAL #2   Status  On-going              Patient will benefit from skilled therapeutic intervention in order to improve the following deficits and impairments:     Visit Diagnosis: Open wound of right foot, initial encounter  Difficulty in walking, not elsewhere classified  Amputation of toe of right foot [facility])     Problem List Patient Active Problem List   Diagnosis Date Noted  . Closed fracture of proximal end of right humerus with routine healing 02/25/2019  . Pre-ulcerative calluses 04/20/2018  . 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 Diabetic foot ulcer associated with type 2 diabetes mellitus ([place]) 07/07/2014  . Tobacco abuse 07/07/2014  . Infestation, maggots 07/07/2014  . Plantar ulcer of right foot ([place]) 05/12/2014  . Pulse weakness   .  Peripheral neuropathy   . Cellulitis of right lower extremity 05/09/2014  . Obesity 05/09/2014  . Cellulitis of right lower leg 05/09/2014  . Hyperglycemia 03/21/2013  . IDDM (insulin dependent diabetes mellitus) 03/21/2013  .  Noncompliance with medications due to cost issues 03/21/2013   Becky Sax, LPTA/CLT; CBIS [phone]  Juel Burrow 04/10/2019, 6:55 PM  [facility] [address] [place], [place], [address] Phone: [phone]   Fax:  [phone]  Name: Mitchell [name] MRN: [id number] Date of Birth: [birth year]

## 2019-04-14 ENCOUNTER — Other Ambulatory Visit: Payer: Self-pay

## 2019-04-14 ENCOUNTER — Encounter (HOSPITAL_COMMUNITY): Payer: Self-pay | Admitting: Physical Therapy

## 2019-04-14 ENCOUNTER — Ambulatory Visit (HOSPITAL_COMMUNITY): Payer: Medicare HMO | Admitting: Physical Therapy

## 2019-04-14 DIAGNOSIS — S98131A Complete traumatic amputation of one right lesser toe, initial encounter: Secondary | ICD-10-CM

## 2019-04-14 DIAGNOSIS — R262 Difficulty in walking, not elsewhere classified: Secondary | ICD-10-CM | POA: Diagnosis not present

## 2019-04-14 DIAGNOSIS — S91301A Unspecified open wound, right foot, initial encounter: Secondary | ICD-10-CM | POA: Diagnosis not present

## 2019-04-14 DIAGNOSIS — E119 Type 2 diabetes mellitus without complications: Secondary | ICD-10-CM | POA: Diagnosis not present

## 2019-04-14 NOTE — Therapy (Signed)
[place], [place], [address] Phone: [phone]   Fax:  [phone]  Wound Care Therapy  Patient Details  Name: Mitchell [name] MRN: [id number] Date of Birth: [birth year] Referring Provider (PT): [name]   Encounter Date: 04/14/2019  PT End of Session - 04/14/19 1225    Visit Number  29    Number of Visits  32    Date for PT Re-Evaluation  05/19/19    Authorization Type  [employer]na Medicare    Authorization Time Period  POC dates 01/21/19 to 03/18/2019; NEW POC DATES 03/02/19 to 3/71/0626, RECERT and PN performed on visit 26, NEW POC DATES 04/07/19 to 05/19/2019    Progress Note Due on Visit  32    PT Start Time  1130    PT Stop Time  1214    PT Time Calculation (min)  44 min    Activity Tolerance  Patient tolerated treatment well;No increased pain    Behavior During Therapy  WFL for tasks assessed/performed       Past Medical History:  Diagnosis Date  . Arthritis   . Cellulitis of right lower extremity 05/09/2014  . Diabetes mellitus without complication ([place])   . Diabetic foot ulcer ([place]) 07/07/2014   Status post bedside debridement of multiple right plantar ulcers by podiatrist, Dr. Laurena Spies.  . Hypercholesterolemia   . Maggot infestation    right foot ulcer  . Obesity 05/09/2014  . Tobacco abuse 07/07/2014    Past Surgical History:  Procedure Laterality Date  . APPENDECTOMY    . PERIPHERAL VASCULAR CATHETERIZATION N/A 07/22/2015   Procedure: Abdominal Aortogram w/Lower Extremity;  Surgeon: Elam Dutch, MD;  Location: [place] CV LAB;  Service: Cardiovascular;  Laterality: N/A;    There were no vitals filed for this visit.     [facility] PT Assessment - 04/14/19 0001      Assessment   Medical Diagnosis  diabetic ulcer of toe of right foot     Referring Provider (PT)  [name]    Onset Date/Surgical Date  12/15/18              Wound Therapy - 04/14/19 1227    Subjective  patient complains of itching  on the back of his leg that started this morning. States the bandages rolled down since last session.     Patient and Family Stated Goals  for incision/wound to heal    Date of Onset  12/15/18    Prior Treatments  betadine, wet to dry dressings, amputation of thrid toe on R foot    Pain Scale  0-10    Pain Score  0-No pain    Evaluation and Treatment Procedures Explained to Patient/Family  Yes    Evaluation and Treatment Procedures  agreed to    Wound Properties Date First Assessed: 03/23/19 Time First Assessed: 1140 Wound Type: Other (Comment) , blister  Location: Leg Location Orientation: Right;Posterior;Lower Wound Description (Comments): popped blister on posterior aspect of leg Present on Admission: No   Dressing Type  Compression wrap;Impregnated gauze (bismuth);Gauze (Comment)   medihoney on gauze   Dressing Changed  Changed    Dressing Status  Old drainage    Dressing Change Frequency  PRN    % Wound base Red or Granulating  70%    % Wound base Other/Granulation Tissue (Comment)  30%    Peri-wound Assessment  Edema;Erythema (non-blanchable);Induration    Drainage Amount  Scant    Drainage Description  Serosanguineous    Treatment  Cleansed;Debridement (Selective)    Selective Debridement - Location  posterior aspect of RT calf     Selective Debridement - Tools Used  Forceps;Scalpel    Selective Debridement - Tissue Removed  fibrous tissue and devitalized tisuse    Wound Therapy - Clinical Statement  Continued improvements in wound to posterior aspect of lower leg. Increase in swelling noted especially in proximal lower leg. Induration noted throughout lower leg so focus was on retrograde edema massage to this area. Debrided posterior wound and it continue to heal well. Continued with medihoney and coban with extra cotton secondary to proximal edema. Patient would continue to benefit from skilled physical therapy to promote optimal wound healing.     Wound Therapy - Functional Problem  List  pain with walking, standing and sleeping    Factors Delaying/Impairing Wound Healing  Diabetes Mellitus;Vascular compromise;Multiple medical problems    Hydrotherapy Plan  Debridement;Pulsatile lavage with suction    Wound Therapy - Frequency  2X / week    Wound Therapy - Current Recommendations  PT    Wound Plan  continue with appropriate woundcare patietn to see MD on 4/1    Dressing   gauze with medihoney, stockette #6, cotton and coban with no stretch;  xerofrom to anterior shin                PT Short Term Goals - 04/10/19 1852      PT SHORT TERM GOAL #1   Title  PT wound to be 50% granulated    Baseline  both wounds > 50%    Status  Achieved      PT SHORT TERM GOAL #2   Title  Patient will demonstrate at least 50% improvement in swelling in toes per visual inspection by PT    Status  Achieved        PT Long Term Goals - 04/10/19 1852      PT LONG TERM GOAL #1   Title  Patient will report no more then 2/10 pain in his foot to improve QOL.    Baseline  current pan 0/10    Status  Achieved      PT LONG TERM GOAL #2   Status  On-going            Plan - 04/14/19 1226    Clinical Impression Statement  see above    Personal Factors and Comorbidities  Age;Comorbidity 1;Comorbidity 2;Fitness    Examination-Activity Limitations  Bathing;Dressing;Hygiene/Grooming;Locomotion Level;Stand;Stairs;Squat;Sleep;Transfers    Examination-Participation Restrictions  Cleaning;Driving;Community Activity    Stability/Clinical Decision Making  Evolving/Moderate complexity    Rehab Potential  Good    PT Frequency  2x / week    PT Duration  6 weeks   5 weeks   PT Treatment/Interventions  Other (comment);Compression bandaging;Scar mobilization;Patient/family education;Manual techniques   wound debridment, pulse lavage   PT Next Visit Plan  continue with POC - additinal visit this week to follow up with erythema    Consulted and Agree with Plan of Care  Patient        Patient will benefit from skilled therapeutic intervention in order to improve the following deficits and impairments:  Abnormal gait, Decreased activity tolerance, Decreased balance, Decreased endurance, Decreased knowledge of precautions, Decreased mobility, Decreased range of motion, Difficulty walking, Pain, Other (comment), Increased edema, Impaired sensation, Decreased skin integrity(wound)  Visit Diagnosis: Open wound of right foot, initial encounter  Difficulty in walking, not elsewhere classified  Amputation of  toe of right foot [facility])     Problem List Patient Active Problem List   Diagnosis Date Noted  . Closed fracture of proximal end of right humerus with routine healing 02/25/2019  . Pre-ulcerative calluses 04/20/2018  . 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 Diabetic foot ulcer associated with type 2 diabetes mellitus (HCC) 07/07/2014  . Tobacco abuse 07/07/2014  . Infestation, maggots 07/07/2014  . Plantar ulcer of right foot (HCC) 05/12/2014  . Pulse weakness   . Peripheral neuropathy   . Cellulitis of right lower extremity 05/09/2014  . Obesity 05/09/2014  . Cellulitis of right lower leg 05/09/2014  . Hyperglycemia 03/21/2013  . IDDM (insulin dependent diabetes mellitus) 03/21/2013  . Noncompliance with medications due to cost issues 03/21/2013  12:31 PM, 04/14/19 Tereasa Coop, DPT Physical Therapy with [facility]  [phone] office  [facility] [address] [place], [place], [address] Phone: [phone]   Fax:  [phone]  Name: Mitchell [name] MRN: [id number] Date of Birth: 30-May-1950

## 2019-04-15 ENCOUNTER — Ambulatory Visit: Payer: Medicare HMO | Admitting: Podiatry

## 2019-04-15 DIAGNOSIS — S98131A Complete traumatic amputation of one right lesser toe, initial encounter: Secondary | ICD-10-CM

## 2019-04-15 DIAGNOSIS — M80871A Other osteoporosis with current pathological fracture, right ankle and foot, initial encounter for fracture: Secondary | ICD-10-CM | POA: Diagnosis not present

## 2019-04-15 DIAGNOSIS — M869 Osteomyelitis, unspecified: Secondary | ICD-10-CM | POA: Diagnosis not present

## 2019-04-16 ENCOUNTER — Encounter (HOSPITAL_COMMUNITY): Payer: Self-pay | Admitting: Physical Therapy

## 2019-04-16 ENCOUNTER — Ambulatory Visit (HOSPITAL_COMMUNITY): Payer: Medicare HMO | Attending: Podiatry | Admitting: Physical Therapy

## 2019-04-16 ENCOUNTER — Other Ambulatory Visit: Payer: Self-pay

## 2019-04-16 ENCOUNTER — Other Ambulatory Visit: Payer: Medicare HMO

## 2019-04-16 ENCOUNTER — Encounter: Payer: Self-pay | Admitting: Podiatry

## 2019-04-16 DIAGNOSIS — S98131A Complete traumatic amputation of one right lesser toe, initial encounter: Secondary | ICD-10-CM | POA: Insufficient documentation

## 2019-04-16 DIAGNOSIS — R29898 Other symptoms and signs involving the musculoskeletal system: Secondary | ICD-10-CM | POA: Insufficient documentation

## 2019-04-16 DIAGNOSIS — R262 Difficulty in walking, not elsewhere classified: Secondary | ICD-10-CM | POA: Diagnosis not present

## 2019-04-16 DIAGNOSIS — S91301A Unspecified open wound, right foot, initial encounter: Secondary | ICD-10-CM | POA: Diagnosis not present

## 2019-04-16 DIAGNOSIS — M25611 Stiffness of right shoulder, not elsewhere classified: Secondary | ICD-10-CM | POA: Insufficient documentation

## 2019-04-16 DIAGNOSIS — M25511 Pain in right shoulder: Secondary | ICD-10-CM | POA: Diagnosis not present

## 2019-04-16 NOTE — Progress Notes (Signed)
Subjective:  Patient ID: Mitchell [name], male    DOB: [birth year],  MRN: [id number]  Chief Complaint  Patient presents with  . Foot Pain    pt is here for a f/u on ulcer to the right foot, pt is doing a lot better, pt also states that the right foot is doing a lot better, pt is looking to wear regular shoes    69 y.o. 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  Patient states the wound care center nearby his house has been doing amazing job the wound is completely healed.  There is a small area of ulceration there is very superficial.  The wound care is continue to do the phenomenal job to decrease the wound.  At this time I will let the wound care center manage the wound completely.  However patient presents today for an MRI evaluation of the right lower extremity given that there was some pathologic fractures that was found in an acute setting with nonhealing wound and I was concerned for osteomyelitic changes.  Patient had a broken arm prior to getting the MRI done and was being treated for that and we have discussed to hold off the MRI for now until his arm has been evaluated and treated.  Patient now presents today for evaluation of the right foot and to get the MRI of the right foot.  He denies any other acute complaints.  No nausea fever chills.  No signs of infection.  Review of Systems: Negative except as noted in the HPI. Denies N/V/F/Ch.  Past Medical History:  Diagnosis Date  . Arthritis   . Cellulitis of right lower extremity 05/09/2014  . Diabetes mellitus without complication (HCC)   . Diabetic foot ulcer (HCC) 07/07/2014   Status post bedside debridement of multiple right plantar ulcers by podiatrist, Dr. Reynolds Bowl.  . Hypercholesterolemia   . Maggot infestation    right foot ulcer  . Obesity 05/09/2014  . Tobacco abuse 07/07/2014    Current Outpatient Medications:  .  aspirin EC 81 MG tablet, Take 81 mg by mouth  daily., Disp: , Rfl:  .  doxycycline (VIBRA-TABS) 100 MG tablet, Take 1 tablet (100 mg total) by mouth 2 (two) times daily., Disp: 28 tablet, Rfl: 0 .  furosemide (LASIX) 20 MG tablet, Take 1 tablet (20 mg total) by mouth daily., Disp: 30 tablet, Rfl: 0 .  gabapentin (NEURONTIN) 300 MG capsule, Take 1 capsule (300 mg total) by mouth 3 (three) times daily., Disp: 90 capsule, Rfl: 0 .  HYDROcodone-acetaminophen (NORCO/VICODIN) 5-325 MG tablet, Take 1-2 tablets by mouth every 6 (six) hours as needed for moderate pain., Disp: 40 tablet, Rfl: 0 .  ibuprofen (ADVIL) 800 MG tablet, Take 1 tablet (800 mg total) by mouth every 6 (six) hours as needed., Disp: 60 tablet, Rfl: 1 .  JARDIANCE 25 MG TABS tablet, Take 25 mg by mouth daily. , Disp: , Rfl:  .  lovastatin (MEVACOR) 20 MG tablet, Take 1 tablet (20 mg total) by mouth at bedtime., Disp: 30 tablet, Rfl: 0 .  metFORMIN (GLUCOPHAGE) 500 MG tablet, Take 1,000 mg by mouth 2 (two) times daily., Disp: , Rfl:  .  NOVOLOG FLEXPEN 100 UNIT/ML FlexPen, Inject 15-30 Units into the skin See admin instructions. 15 units with breakfast 30 units with lunch 30 units with dinner, Disp: , Rfl:  .  TRESIBA FLEXTOUCH 200 UNIT/ML SOPN, Inject 55 Units into the skin daily.,  Disp: , Rfl:   Social History   Tobacco Use  Smoking Status Current Every Day Smoker  . Packs/day: 1.50  . Types: Cigarettes  Smokeless Tobacco Never Used    Allergies  Allergen Reactions  . Penicillins Itching and Rash    Has patient had a PCN reaction causing immediate rash, facial/tongue/throat swelling, SOB or lightheadedness with hypotension: Yes Has patient had a PCN reaction causing severe rash involving mucus membranes or skin necrosis: No Has patient had a PCN reaction that required hospitalization No Has patient had a PCN reaction occurring within the last 10 years: No If all of the above answers are "NO", then may proceed with Cephalosporin use.    Objective:  There were no vitals  filed for this visit. There is no height or weight on file to calculate BMI. Constitutional Well developed. Well nourished.  Vascular Foot warm and well perfused. Capillary refill normal to all digits.   Neurologic Normal speech. Oriented to person, place, and time. Epicritic sensation to light touch grossly present bilaterally.  Dermatologic  knee amputation dehiscence wound is mostly closed and reepithelialized.  Small ulceration still present at the proximal portion of the incision.  This appears to be very superficial in nature.  No clinical signs of infection.  No maceration present.  Orthopedic: Tenderness to palpation noted about the surgical site.   Radiographs:None  Assessment:   No diagnosis found. Plan:  Patient was evaluated and treated and all questions answered.  History right third digit amputation -Completely healed and resolved.  No further management of wound noted. -Patient will transition to weightbearing as tolerated in diabetic shoes.  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  If there was any sign of acute infection the wound would not achieve closure. -I will see him back as needed and I have educated him with if there is any pressure sores that develop in the future come back and see me right away.  He states understanding.   No follow-ups on file.

## 2019-04-16 NOTE — Therapy (Signed)
[place], [place], [address] Phone: [phone]   Fax:  [phone]  Wound Care Therapy and Discharge Note  Patient Details  Name: Mitchell [name] MRN: [id number] Date of Birth: 01-09-1951 Referring Provider (PT): [name]  PHYSICAL THERAPY DISCHARGE SUMMARY  Visits from Start of Care: 30  Current functional level related to goals / functional outcomes: All goals met  Remaining deficits: Continued swelling and induration    Education / Equipment: See below Plan: Patient agrees to discharge.  Patient goals were met. Patient is being discharged due to meeting the stated rehab goals.  ?????       Encounter Date: 04/16/2019  PT End of Session - 04/16/19 1225    Visit Number  30    Number of Visits  32    Date for PT Re-Evaluation  05/19/19    Authorization Type  [employer] Medicare    Authorization Time Period  POC dates 01/21/19 to 03/18/2019; NEW POC DATES 03/02/19 to 6/71/2458, RECERT and PN performed on visit 26, NEW POC DATES 04/07/19 to 05/19/2019    Progress Note Due on Visit  32    PT Start Time  1045    PT Stop Time  1115    PT Time Calculation (min)  30 min    Activity Tolerance  Patient tolerated treatment well;No increased pain    Behavior During Therapy  WFL for tasks assessed/performed       Past Medical History:  Diagnosis Date  . Arthritis   . Cellulitis of right lower extremity 05/09/2014  . Diabetes mellitus without complication ([place])   . Diabetic foot ulcer ([place]) 07/07/2014   Status post bedside debridement of multiple right plantar ulcers by podiatrist, Dr. Laurena Spies.  . Hypercholesterolemia   . Maggot infestation    right foot ulcer  . Obesity 05/09/2014  . Tobacco abuse 07/07/2014    Past Surgical History:  Procedure Laterality Date  . APPENDECTOMY    . PERIPHERAL VASCULAR CATHETERIZATION N/A 07/22/2015   Procedure: Abdominal Aortogram w/Lower Extremity;  Surgeon: Elam Dutch, MD;  Location:  [facility];  Service: Cardiovascular;  Laterality: N/A;    There were no vitals filed for this visit.     [facility] PT Assessment - 04/16/19 0001      Assessment   Medical Diagnosis  diabetic ulcer of toe of right foot     Referring Provider (PT)  [name]    Onset Date/Surgical Date  12/15/18              Wound Therapy - 04/16/19 1227    Subjective  Patient saw MD and has been released from his care secondary to amputation site completely healed. States bandages rolled down again.    Patient and Family Stated Goals  for incision/wound to heal    Date of Onset  12/15/18    Prior Treatments  betadine, wet to dry dressings, amputation of thrid toe on R foot    Pain Scale  0-10    Pain Score  0-No pain    Evaluation and Treatment Procedures Explained to Patient/Family  Yes    Evaluation and Treatment Procedures  agreed to    Wound Properties Date First Assessed: 03/23/19 Time First Assessed: 1140 Wound Type: Other (Comment) , blister  Location: Leg Location Orientation: Right;Posterior;Lower Wound Description (Comments): popped blister on posterior aspect of leg Present on Admission: No Final Assessment Date: 04/16/19 Final Assessment Time: 1058   Selective  Debridement - Location  posterior aspect of RT calf     Selective Debridement - Tools Used  Forceps    Selective Debridement - Tissue Removed  devitalized tisuse    Wound Therapy - Clinical Statement  Patient presents with scabbing and devitalized tissue over posterior a calf, this tissue easily came up and revealed completely heal wound. Skin irritation (like heat rash) noted on front of leg where bandage was bunched up. Instructed patient to monitor this, anticipate this to clear up now that bandaging has been moved  and will no longer be worn. Discussed continued skin care and daily feet inspections. Instructed patient to continue to elevate his feet as he cannot don compression stockings at home for edema management.  Patient to be discharged from physical therapy at this time to self management strategies secondary to wounds completely healed.     Wound Therapy - Functional Problem List  pain with walking, standing and sleeping    Factors Delaying/Impairing Wound Healing  Diabetes Mellitus;Vascular compromise;Multiple medical problems    Hydrotherapy Plan  Debridement;Pulsatile lavage with suction    Wound Therapy - Frequency  2X / week    Wound Therapy - Current Recommendations  PT    Wound Plan  Patient to be dischargef from PT to self management strategies to prevent additional wounds              PT Education - 04/16/19 1225    Education Details  on continued skin care, keeping toes dry and wearing diabetic shoes. Inspecting feet daily    Person(s) Educated  Patient    Methods  Explanation    Comprehension  Verbalized understanding       PT Short Term Goals - 04/10/19 1852      PT SHORT TERM GOAL #1   Title  PT wound to be 50% granulated    Baseline  both wounds > 50%    Status  Achieved      PT SHORT TERM GOAL #2   Title  Patient will demonstrate at least 50% improvement in swelling in toes per visual inspection by PT    Status  Achieved        PT Long Term Goals - 04/16/19 1232      PT LONG TERM GOAL #1   Title  Patient will report no more then 2/10 pain in his foot to improve QOL.    Baseline  current pan 0/10    Status  Achieved      PT LONG TERM GOAL #2   Title  Wound will be 100% granulated    Baseline  one wound 100% healed and one wound 30% granulated    Status  Achieved            Plan - 04/16/19 1225    Clinical Impression Statement  see above    Personal Factors and Comorbidities  Age;Comorbidity 1;Comorbidity 2;Fitness    Examination-Activity Limitations  Bathing;Dressing;Hygiene/Grooming;Locomotion Level;Stand;Stairs;Squat;Sleep;Transfers    Examination-Participation Restrictions  Cleaning;Driving;Community Activity    Stability/Clinical Decision  Making  Evolving/Moderate complexity    Rehab Potential  Good    PT Frequency  2x / week    PT Duration  6 weeks   5 weeks   PT Treatment/Interventions  Other (comment);Compression bandaging;Scar mobilization;Patient/family education;Manual techniques   wound debridment, pulse lavage   PT Next Visit Plan  patient to discharge from PT to self care at this time    Consulted and Agree with Plan of Care  Patient  Patient will benefit from skilled therapeutic intervention in order to improve the following deficits and impairments:  Abnormal gait, Decreased activity tolerance, Decreased balance, Decreased endurance, Decreased knowledge of precautions, Decreased mobility, Decreased range of motion, Difficulty walking, Pain, Other (comment), Increased edema, Impaired sensation, Decreased skin integrity(wound)  Visit Diagnosis: Open wound of right foot, initial encounter  Difficulty in walking, not elsewhere classified  Amputation of toe of right foot [facility])     Problem List Patient Active Problem List   Diagnosis Date Noted  . Closed fracture of proximal end of right humerus with routine healing 02/25/2019  . Pre-ulcerative calluses 04/20/2018  . 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 Diabetic foot ulcer associated with type 2 diabetes mellitus ([place]) 07/07/2014  . Tobacco abuse 07/07/2014  . Infestation, maggots 07/07/2014  . Plantar ulcer of right foot ([place]) 05/12/2014  . Pulse weakness   . Peripheral neuropathy   . Cellulitis of right lower extremity 05/09/2014  . Obesity 05/09/2014  . Cellulitis of right lower leg 05/09/2014  . Hyperglycemia 03/21/2013  . IDDM (insulin dependent diabetes mellitus) 03/21/2013  . Noncompliance with medications due to cost issues 03/21/2013    12:34 PM, 04/16/19 Jerene Pitch, DPT Physical Therapy with [facility]  [phone] office  [place] [address] [place], [place],  [address] Phone: [phone]   Fax:  [phone]  Name: Mitchell [name] MRN: [id number] Date of Birth: 03-Apr-1950

## 2019-04-17 DIAGNOSIS — E119 Type 2 diabetes mellitus without complications: Secondary | ICD-10-CM | POA: Diagnosis not present

## 2019-04-17 DIAGNOSIS — E7800 Pure hypercholesterolemia, unspecified: Secondary | ICD-10-CM | POA: Diagnosis not present

## 2019-04-21 ENCOUNTER — Ambulatory Visit (HOSPITAL_COMMUNITY): Payer: Self-pay | Admitting: Physical Therapy

## 2019-04-23 ENCOUNTER — Ambulatory Visit: Payer: Medicare HMO | Admitting: Orthopaedic Surgery

## 2019-04-28 ENCOUNTER — Ambulatory Visit (HOSPITAL_COMMUNITY): Payer: Self-pay | Admitting: Physical Therapy

## 2019-04-29 ENCOUNTER — Ambulatory Visit: Payer: Medicare HMO | Admitting: Physician Assistant

## 2019-04-29 ENCOUNTER — Ambulatory Visit (INDEPENDENT_AMBULATORY_CARE_PROVIDER_SITE_OTHER): Payer: Medicare HMO

## 2019-04-29 ENCOUNTER — Ambulatory Visit (HOSPITAL_COMMUNITY): Payer: Self-pay | Admitting: Physical Therapy

## 2019-04-29 ENCOUNTER — Encounter: Payer: Self-pay | Admitting: Physician Assistant

## 2019-04-29 ENCOUNTER — Other Ambulatory Visit: Payer: Self-pay

## 2019-04-29 DIAGNOSIS — S42291D Other displaced fracture of upper end of right humerus, subsequent encounter for fracture with routine healing: Secondary | ICD-10-CM

## 2019-04-29 NOTE — Progress Notes (Signed)
Office Visit Note   Patient: Mitchell [name]           Date of Birth: 12/07/1950           MRN: [id number] Visit Date: 04/29/2019              Requested by: Kirstie Peri, MD [address] [place],  [place] [address] PCP: Kirstie Peri, MD   Assessment & Plan: Visit Diagnoses:  1. Other closed displaced fracture of proximal end of right humerus with routine healing, subsequent encounter     Plan: We will send him to physical therapy to work on range of motion and strengthening of the right shoulder.  At his request he like to be seen back in 6 weeks to see how he is doing is came along with physical therapy no radiographs at that time unless clinically indicated.  Questions encouraged and answered.  Follow-Up Instructions: Return in about 6 weeks (around 06/10/2019).   Orders:  Orders Placed This Encounter  Procedures  . XR Shoulder 1V Right   No orders of the defined types were placed in this encounter.     Procedures: No procedures performed   Clinical Data: No additional findings.   Subjective: Chief Complaint  Patient presents with  . Right Shoulder - Follow-up    HPI [name] returns now approximately 10 weeks status post right proximal humerus fracture.  He states overall he is doing better.  His range of motion is still decreased.  He is not wearing a sling.  He is trying to do some home exercises. Review of Systems See HPI otherwise negative or noncontributory.  Objective: Vital Signs: There were no vitals taken for this visit.  Physical Exam Constitutional:      Appearance: He is not ill-appearing or diaphoretic.  Pulmonary:     Effort: Pulmonary effort is normal.  Neurological:     Mental Status: He is alert.  Psychiatric:        Behavior: Behavior normal.     Ortho Exam Right shoulder limited forward flexion actively to approximately 85 degrees passively I can bring the 120 degrees.  He has limited external rotation of the right shoulder.  Good range of  motion the elbow full supination pronation forearm. Specialty Comments:  No specialty comments available.  Imaging: XR Shoulder 1V Right  Result Date: 04/29/2019 AP shoulder: Shoulder appears located.  Fracture good consolidation.  Fracture still slightly visible.  No change in overall position alignment.    PMFS History: Patient Active Problem List   Diagnosis Date Noted  . Closed fracture of proximal end of right humerus with routine healing 02/25/2019  . Pre-ulcerative calluses 04/20/2018  . 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 Diabetic foot ulcer associated with type 2 diabetes mellitus (HCC) 07/07/2014  . Tobacco abuse 07/07/2014  . Infestation, maggots 07/07/2014  . Plantar ulcer of right foot (HCC) 05/12/2014  . Pulse weakness   . Peripheral neuropathy   . Cellulitis of right lower extremity 05/09/2014  . Obesity 05/09/2014  . Cellulitis of right lower leg 05/09/2014  . Hyperglycemia 03/21/2013  . IDDM (insulin dependent diabetes mellitus) 03/21/2013  . Noncompliance with medications due to cost issues 03/21/2013   Past Medical History:  Diagnosis Date  . Arthritis   . Cellulitis of right lower extremity 05/09/2014  . Diabetes mellitus without complication (HCC)   . Diabetic foot ulcer (HCC) 07/07/2014   Status post bedside debridement of multiple right plantar ulcers by podiatrist, Dr. Reynolds Bowl.  . Hypercholesterolemia   .  Maggot infestation    right foot ulcer  . Obesity 05/09/2014  . Tobacco abuse 07/07/2014    History reviewed. No pertinent family history.  Past Surgical History:  Procedure Laterality Date  . APPENDECTOMY    . PERIPHERAL VASCULAR CATHETERIZATION N/A 07/22/2015   Procedure: Abdominal Aortogram w/Lower Extremity;  Surgeon: Elam Dutch, MD;  Location: [facility];  Service: Cardiovascular;  Laterality: N/A;   Social History   Occupational History  . Not on file  Tobacco Use  . Smoking status: Current Every Day Smoker    Packs/day: 1.50    Types:  Cigarettes  . Smokeless tobacco: Never Used  Substance and Sexual Activity  . Alcohol use: Yes    Alcohol/week: 0.0 standard drinks    Comment: occ-twice a week beer approx 2  . Drug use: No  . Sexual activity: Not on file

## 2019-04-30 ENCOUNTER — Telehealth: Payer: Self-pay

## 2019-04-30 ENCOUNTER — Other Ambulatory Visit: Payer: Self-pay | Admitting: Radiology

## 2019-04-30 DIAGNOSIS — S42291D Other displaced fracture of upper end of right humerus, subsequent encounter for fracture with routine healing: Secondary | ICD-10-CM

## 2019-04-30 NOTE — Telephone Encounter (Signed)
Yolanda from [facility] physical therapy. Stated they received referral for patient but it needs to be changed from physical therapy to occupational therapy since it is for the shoulder. She said as soon as referral is changed they can get patient scheduled.

## 2019-05-01 NOTE — Telephone Encounter (Signed)
Order changed. Sent message to [place].

## 2019-05-08 ENCOUNTER — Ambulatory Visit (HOSPITAL_COMMUNITY): Payer: Medicare HMO | Admitting: Occupational Therapy

## 2019-05-08 ENCOUNTER — Other Ambulatory Visit: Payer: Self-pay

## 2019-05-08 ENCOUNTER — Encounter (HOSPITAL_COMMUNITY): Payer: Self-pay | Admitting: Occupational Therapy

## 2019-05-08 DIAGNOSIS — M25511 Pain in right shoulder: Secondary | ICD-10-CM

## 2019-05-08 DIAGNOSIS — S98131A Complete traumatic amputation of one right lesser toe, initial encounter: Secondary | ICD-10-CM | POA: Diagnosis not present

## 2019-05-08 DIAGNOSIS — M25611 Stiffness of right shoulder, not elsewhere classified: Secondary | ICD-10-CM | POA: Diagnosis not present

## 2019-05-08 DIAGNOSIS — R262 Difficulty in walking, not elsewhere classified: Secondary | ICD-10-CM | POA: Diagnosis not present

## 2019-05-08 DIAGNOSIS — S91301A Unspecified open wound, right foot, initial encounter: Secondary | ICD-10-CM | POA: Diagnosis not present

## 2019-05-08 DIAGNOSIS — R29898 Other symptoms and signs involving the musculoskeletal system: Secondary | ICD-10-CM

## 2019-05-08 NOTE — Patient Instructions (Signed)
1) SHOULDER: Flexion On Table   Place hands on towel placed on table, elbows straight. Lean forward with you upper body, pushing towel away from body.  _15__ reps per set, _3__ sets per day  2) Abduction (Passive)   With arm out to side, resting on towel placed on table with palm DOWN, keeping trunk away from table, lean to the side while pushing towel away from body.  Repeat __15__ times. Do __3__ sessions per day.  Copyright  VHI. All rights reserved.     3) Internal Rotation (Assistive)   Seated with elbow bent at right angle and held against side, slide arm on table surface in an inward arc keeping elbow anchored in place. Repeat _15___ times. Do __3__ sessions per day. Activity: Use this motion to brush crumbs off the table.  Copyright  VHI. All rights reserved.    

## 2019-05-08 NOTE — Therapy (Signed)
[facility] [address] [place], [place], [address] Phone: [phone]   Fax:  [phone]  Occupational Therapy Evaluation  Patient Details  Name: Mitchell [name] MRN: [id number] Date of Birth: 08-Apr-1950 Referring Provider (OT): Richardean Canal, PA-C   Encounter Date: 05/08/2019  OT End of Session - 05/08/19 1507    Visit Number  1    Number of Visits  16    Date for OT Re-Evaluation  07/07/19   Mini reassessment 06/05/19   Authorization Type  [employer] Medicare, $20 copay    Authorization Time Period  No visit limit    Progress Note Due on Visit  10    OT Start Time  1433    OT Stop Time  1501    OT Time Calculation (min)  28 min    Activity Tolerance  Patient tolerated treatment well    Behavior During Therapy  [facility] for tasks assessed/performed       Past Medical History:  Diagnosis Date  . Arthritis   . Cellulitis of right lower extremity 05/09/2014  . Diabetes mellitus without complication (HCC)   . Diabetic foot ulcer (HCC) 07/07/2014   Status post bedside debridement of multiple right plantar ulcers by podiatrist, Dr. Reynolds Bowl.  . Hypercholesterolemia   . Maggot infestation    right foot ulcer  . Obesity 05/09/2014  . Tobacco abuse 07/07/2014    Past Surgical History:  Procedure Laterality Date  . APPENDECTOMY    . PERIPHERAL VASCULAR CATHETERIZATION N/A 07/22/2015   Procedure: Abdominal Aortogram w/Lower Extremity;  Surgeon: Sherren Kerns, MD;  Location: [facility] INVASIVE CV LAB;  Service: Cardiovascular;  Laterality: N/A;    There were no vitals filed for this visit.  Subjective Assessment - 05/08/19 1454    Subjective   S: I hope you can do something with this arm.    Pertinent History  Pt is a 69 y/o male s/p right proximal humerus fx sustained on 02/13/19 after falling at a restaurant. Pt reports he has very limited use of his RUE. Pt was referred to occupational therapy for evaluation and treatment by Richardean Canal, PA-C.    Special  Tests  FOTO: 48/100    Patient Stated Goals  To be able to go fishing again.    Currently in Pain?  No/denies        [facility] OT Assessment - 05/08/19 1426      Assessment   Medical Diagnosis  right proximal humerus fracture    Referring Provider (OT)  Richardean Canal, PA-C    Onset Date/Surgical Date  02/13/19    Hand Dominance  Right    Next MD Visit  06/10/19    Prior Therapy  None for this issue      Precautions   Precautions  None      Restrictions   Weight Bearing Restrictions  No      Balance Screen   Has the patient fallen in the past 6 months  Yes    How many times?  1    Has the patient had a decrease in activity level because of a fear of falling?   No    Is the patient reluctant to leave their home because of a fear of falling?   No      Prior Function   Level of Independence  Independent    Vocation  On disability    Leisure  fishing, gardening, yardwork.       ADL  ADL comments  Pt is having difficulty with reaching overhead, behind back, dressing, bathing, doing housework, and driving.       Written Expression   Dominant Hand  Right      Cognition   Overall Cognitive Status  Within Functional Limits for tasks assessed      Observation/Other Assessments   Focus on Therapeutic Outcomes (FOTO)   48/100      ROM / Strength   AROM / PROM / Strength  AROM;PROM;Strength      Palpation   Palpation comment  Max fascial restrictions in left upper arm, trapezius, and scapular regions      AROM   Overall AROM Comments  Assessed seated, er/IR adducted    AROM Assessment Site  Shoulder    Right/Left Shoulder  Right    Right Shoulder Flexion  60 Degrees    Right Shoulder ABduction  51 Degrees    Right Shoulder Internal Rotation  90 Degrees    Right Shoulder External Rotation  27 Degrees      PROM   Overall PROM Comments  Assessed supine, er/IR adducted    PROM Assessment Site  Shoulder    Right/Left Shoulder  Right    Right Shoulder Flexion  96 Degrees     Right Shoulder ABduction  74 Degrees    Right Shoulder Internal Rotation  90 Degrees    Right Shoulder External Rotation  14 Degrees      Strength   Overall Strength Comments  Assessed seated, er/IR adducted    Strength Assessment Site  Shoulder    Right/Left Shoulder  Right    Right Shoulder Flexion  2+/5    Right Shoulder ABduction  2+/5    Right Shoulder Internal Rotation  3/5    Right Shoulder External Rotation  3-/5                      OT Education - 05/08/19 1453    Education Details  table slides    Person(s) Educated  Patient    Methods  Explanation;Demonstration;Handout    Comprehension  Verbalized understanding;Returned demonstration       OT Short Term Goals - 05/08/19 1511      OT SHORT TERM GOAL #1   Title  Pt will be provided with and educated on HEP to improve mobility required for ADL completion using RUE.    Time  4    Period  Weeks    Status  New    Target Date  06/07/19      OT SHORT TERM GOAL #2   Title  Pt will increase RUE P/ROM to [facility] to improve ability to use RUE as assist during dressing tasks.    Time  4    Period  Weeks    Status  New      OT SHORT TERM GOAL #3   Title  Pt will improve RUE strength to 3+/5 to improve ability to reach items at waist to chest height.    Time  4    Period  Weeks    Status  New        OT Long Term Goals - 05/08/19 1512      OT LONG TERM GOAL #1   Title  Pt will decrease pain in RUE to 2/10 or less to improve ability to use RUE as dominant during ADL completion.    Time  8    Period  Weeks    Status  New    Target Date  07/07/19      OT LONG TERM GOAL #2   Title  Pt will increase RUE A/ROM to [facility] to improve ability to reach overhead into cabinets when completing housework.    Time  8    Period  Weeks    Status  New      OT LONG TERM GOAL #3   Title  Pt will decrease RUE fascial restrictions to minimal amounts or less to improve mobility required for functional reaching tasks.     Time  8    Period  Weeks    Status  New      OT LONG TERM GOAL #4   Title  Pt will increase RUE strength to 4/5 or greater to improve ability to perform fishing activities.    Time  8    Period  Weeks    Status  New            Plan - 05/08/19 1508    Clinical Impression Statement  A: Pt is a 70 y/o male s/p right proximal humerus fx on 02/13/19 after falling in a restaurant. Pt reports deficits limiting functional use of RUE during ADL and leisure activities. Pt educated on table slides for stretching and use of heat for muscle relaxation today.    OT Occupational Profile and History  Problem Focused Assessment - Including review of records relating to presenting problem    Occupational performance deficits (Please refer to evaluation for details):  ADL's;IADL's;Leisure    Body Structure / Function / Physical Skills  ADL;Endurance;UE functional use;Fascial restriction;Pain;ROM;IADL;Strength;Edema    Rehab Potential  Good    Clinical Decision Making  Limited treatment options, no task modification necessary    Comorbidities Affecting Occupational Performance:  None    Modification or Assistance to Complete Evaluation   No modification of tasks or assist necessary to complete eval    OT Frequency  2x / week    OT Duration  8 weeks    OT Treatment/Interventions  Self-care/ADL training;Ultrasound;Patient/family education;Passive range of motion;Cryotherapy;Electrical Stimulation;Moist Heat;Therapeutic exercise;Manual Therapy;Therapeutic activities    Plan  P: Pt will benefit from skilled OT services to decrease pain and fascial restrictions, increase joint ROM, strength, and functional use during ADL tasks. Treatment plan: Myofascial release and manual techniques, P/ROM, AA/ROM, A/ROM, general RUE strengthening, scapular mobility and strengthening, modalities prn    OT Home Exercise Plan  eval: table slides    Consulted and Agree with Plan of Care  Patient       Patient will benefit  from skilled therapeutic intervention in order to improve the following deficits and impairments:   Body Structure / Function / Physical Skills: ADL, Endurance, UE functional use, Fascial restriction, Pain, ROM, IADL, Strength, Edema       Visit Diagnosis: Acute pain of right shoulder  Stiffness of right shoulder, not elsewhere classified  Other symptoms and signs involving the musculoskeletal system    Problem List Patient Active Problem List   Diagnosis Date Noted  . Closed fracture of proximal end of right humerus with routine healing 02/25/2019  . Pre-ulcerative calluses 04/20/2018  . 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 Diabetic foot ulcer associated with type 2 diabetes mellitus (HCC) 07/07/2014  . Tobacco abuse 07/07/2014  . Infestation, maggots 07/07/2014  . Plantar ulcer of right foot (HCC) 05/12/2014  . Pulse weakness   . Peripheral neuropathy   . Cellulitis of right lower extremity 05/09/2014  . Obesity 05/09/2014  .  Cellulitis of right lower leg 05/09/2014  . Hyperglycemia 03/21/2013  . IDDM (insulin dependent diabetes mellitus) 03/21/2013  . Noncompliance with medications due to cost issues 03/21/2013   Ezra Sites, OTR/L  [phone] 05/08/2019, 4:08 PM  [facility] [address] [place], [place], [address] Phone: [phone]   Fax:  [phone]  Name: Lamont Tant MRN: [id number] Date of Birth: 12/25/1950

## 2019-05-13 ENCOUNTER — Other Ambulatory Visit: Payer: Self-pay

## 2019-05-13 ENCOUNTER — Encounter (HOSPITAL_COMMUNITY): Payer: Self-pay

## 2019-05-13 ENCOUNTER — Ambulatory Visit (HOSPITAL_COMMUNITY): Payer: Medicare HMO

## 2019-05-13 DIAGNOSIS — M25511 Pain in right shoulder: Secondary | ICD-10-CM | POA: Diagnosis not present

## 2019-05-13 DIAGNOSIS — R262 Difficulty in walking, not elsewhere classified: Secondary | ICD-10-CM | POA: Diagnosis not present

## 2019-05-13 DIAGNOSIS — S98131A Complete traumatic amputation of one right lesser toe, initial encounter: Secondary | ICD-10-CM | POA: Diagnosis not present

## 2019-05-13 DIAGNOSIS — R29898 Other symptoms and signs involving the musculoskeletal system: Secondary | ICD-10-CM | POA: Diagnosis not present

## 2019-05-13 DIAGNOSIS — M25611 Stiffness of right shoulder, not elsewhere classified: Secondary | ICD-10-CM | POA: Diagnosis not present

## 2019-05-13 DIAGNOSIS — S91301A Unspecified open wound, right foot, initial encounter: Secondary | ICD-10-CM | POA: Diagnosis not present

## 2019-05-13 NOTE — Therapy (Signed)
[place], [place], [address] Phone: [phone]   Fax:  [phone]  Occupational Therapy Treatment  Patient Details  Name: Mitchell [name] MRN: [id number] Date of Birth: [birth year] Referring Provider (OT): Erskine Emery, PA-C   Encounter Date: 05/13/2019  OT End of Session - 05/13/19 1645    Visit Number  2    Number of Visits  16    Date for OT Re-Evaluation  07/07/19   Mini reassessment 06/05/19   Authorization Type  [employer]na Medicare, $20 copay    Authorization Time Period  No visit limit    Progress Note Due on Visit  10    OT Start Time  1600    OT Stop Time  1638    OT Time Calculation (min)  38 min    Activity Tolerance  Patient tolerated treatment well    Behavior During Therapy  [facility] for tasks assessed/performed       Past Medical History:  Diagnosis Date  . Arthritis   . Cellulitis of right lower extremity 05/09/2014  . Diabetes mellitus without complication ([place])   . Diabetic foot ulcer ([place]) 07/07/2014   Status post bedside debridement of multiple right plantar ulcers by podiatrist, Dr. Laurena Spies.  . Hypercholesterolemia   . Maggot infestation    right foot ulcer  . Obesity 05/09/2014  . Tobacco abuse 07/07/2014    Past Surgical History:  Procedure Laterality Date  . APPENDECTOMY    . PERIPHERAL VASCULAR CATHETERIZATION N/A 07/22/2015   Procedure: Abdominal Aortogram w/Lower Extremity;  Surgeon: Elam Dutch, MD;  Location: [facility] CV LAB;  Service: Cardiovascular;  Laterality: N/A;    There were no vitals filed for this visit.  Subjective Assessment - 05/13/19 1629    Subjective   S: I will be happy if I can go back to fishing.    Currently in Pain?  Yes    Pain Score  8     Pain Location  Shoulder    Pain Orientation  Right    Pain Descriptors / Indicators  Sore    Pain Type  Acute pain    Pain Radiating Towards  N/A    Pain Onset  Yesterday    Pain Frequency  Constant    Aggravating  Factors   movement and use (using a screwdriver to change the license plate yesterday)    Pain Relieving Factors  pain medication    Effect of Pain on Daily Activities  severe effect         [facility] OT Assessment - 05/13/19 1632      Assessment   Medical Diagnosis  right proximal humerus fracture      Precautions   Precautions  None               OT Treatments/Exercises (OP) - 05/13/19 1632      Exercises   Exercises  Shoulder      Shoulder Exercises: Supine   Protraction  PROM;10 reps    Horizontal ABduction  PROM;10 reps    External Rotation  PROM;10 reps    Internal Rotation  PROM;10 reps    Flexion  PROM;10 reps    ABduction  PROM;10 reps      Shoulder Exercises: Seated   Extension  AROM;10 reps    Row  AROM;10 reps      Shoulder Exercises: Therapy Ball   Flexion  10 reps    ABduction  10 reps  Shoulder Exercises: ROM/Strengthening   Other ROM/Strengthening Exercises  pvc pipe slide; 10X      Manual Therapy   Manual Therapy  Myofascial release    Manual therapy comments  Manual therapy completed prior to exercises.     Myofascial Release  myofascial release and manual stretching completed to right upper arm, trapezius, and scapularis region to decrease fascial restrictions and increase joint mobility in a pain free zone.              OT Education - 05/13/19 1644    Education Details  reviewed goals. recommended that patient either sit or stand at table after completing table slides and wipe the table for 1 minute to work on ROM.    Person(s) Educated  Patient    Methods  Explanation    Comprehension  Verbalized understanding       OT Short Term Goals - 05/13/19 1647      OT SHORT TERM GOAL #1   Title  Pt will be provided with and educated on HEP to improve mobility required for ADL completion using RUE.    Time  4    Period  Weeks    Status  On-going    Target Date  06/07/19      OT SHORT TERM GOAL #2   Title  Pt will increase RUE  P/ROM to [facility] to improve ability to use RUE as assist during dressing tasks.    Time  4    Period  Weeks    Status  On-going      OT SHORT TERM GOAL #3   Title  Pt will improve RUE strength to 3+/5 to improve ability to reach items at waist to chest height.    Time  4    Period  Weeks    Status  On-going        OT Long Term Goals - 05/13/19 1647      OT LONG TERM GOAL #1   Title  Pt will decrease pain in RUE to 2/10 or less to improve ability to use RUE as dominant during ADL completion.    Time  8    Period  Weeks    Status  On-going      OT LONG TERM GOAL #2   Title  Pt will increase RUE A/ROM to [facility] to improve ability to reach overhead into cabinets when completing housework.    Time  8    Period  Weeks    Status  On-going      OT LONG TERM GOAL #3   Title  Pt will decrease RUE fascial restrictions to minimal amounts or less to improve mobility required for functional reaching tasks.    Time  8    Period  Weeks    Status  On-going      OT LONG TERM GOAL #4   Title  Pt will increase RUE strength to 4/5 or greater to improve ability to perform fishing activities.    Time  8    Period  Weeks    Status  On-going            Plan - 05/13/19 1645    Clinical Impression Statement  A: Initiated myofascial release, manual stretching, therapy ball stretching, and pvc pipe slide. patient responded well to manual techniques and passive stretching as he was able to increase his ROM slightly from evaluation. Patient was provided VC for form and technique. Manual techniques completed to address fascial  restrictions.    Body Structure / Function / Physical Skills  ADL;Endurance;UE functional use;Fascial restriction;Pain;ROM;IADL;Strength;Edema    Plan  P: Continue with manual techniques and passive stretching. Add pulleys.    Consulted and Agree with Plan of Care  Patient       Patient will benefit from skilled therapeutic intervention in order to improve the following  deficits and impairments:   Body Structure / Function / Physical Skills: ADL, Endurance, UE functional use, Fascial restriction, Pain, ROM, IADL, Strength, Edema       Visit Diagnosis: Stiffness of right shoulder, not elsewhere classified  Other symptoms and signs involving the musculoskeletal system  Acute pain of right shoulder    Problem List Patient Active Problem List   Diagnosis Date Noted  . Closed fracture of proximal end of right humerus with routine healing 02/25/2019  . Pre-ulcerative calluses 04/20/2018  . 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 Diabetic foot ulcer associated with type 2 diabetes mellitus (HCC) 07/07/2014  . Tobacco abuse 07/07/2014  . Infestation, maggots 07/07/2014  . Plantar ulcer of right foot (HCC) 05/12/2014  . Pulse weakness   . Peripheral neuropathy   . Cellulitis of right lower extremity 05/09/2014  . Obesity 05/09/2014  . Cellulitis of right lower leg 05/09/2014  . Hyperglycemia 03/21/2013  . IDDM (insulin dependent diabetes mellitus) 03/21/2013  . Noncompliance with medications due to cost issues 03/21/2013   Limmie Patricia, OTR/L,CBIS  [phone]  05/13/2019, 5:18 PM  [facility] [address] [place], [place], [address] Phone: [phone]   Fax:  [phone]  Name: Erin Uecker MRN: [id number] Date of Birth: [birth year]

## 2019-05-14 ENCOUNTER — Ambulatory Visit (HOSPITAL_COMMUNITY): Payer: Medicare HMO

## 2019-05-14 DIAGNOSIS — E669 Obesity, unspecified: Secondary | ICD-10-CM | POA: Diagnosis not present

## 2019-05-14 DIAGNOSIS — Z008 Encounter for other general examination: Secondary | ICD-10-CM | POA: Diagnosis not present

## 2019-05-14 DIAGNOSIS — E1142 Type 2 diabetes mellitus with diabetic polyneuropathy: Secondary | ICD-10-CM | POA: Diagnosis not present

## 2019-05-14 DIAGNOSIS — Z794 Long term (current) use of insulin: Secondary | ICD-10-CM | POA: Diagnosis not present

## 2019-05-14 DIAGNOSIS — E785 Hyperlipidemia, unspecified: Secondary | ICD-10-CM | POA: Diagnosis not present

## 2019-05-14 DIAGNOSIS — R69 Illness, unspecified: Secondary | ICD-10-CM | POA: Diagnosis not present

## 2019-05-14 DIAGNOSIS — R609 Edema, unspecified: Secondary | ICD-10-CM | POA: Diagnosis not present

## 2019-05-14 DIAGNOSIS — Z7982 Long term (current) use of aspirin: Secondary | ICD-10-CM | POA: Diagnosis not present

## 2019-05-14 DIAGNOSIS — Z803 Family history of malignant neoplasm of breast: Secondary | ICD-10-CM | POA: Diagnosis not present

## 2019-05-14 DIAGNOSIS — R030 Elevated blood-pressure reading, without diagnosis of hypertension: Secondary | ICD-10-CM | POA: Diagnosis not present

## 2019-05-14 DIAGNOSIS — Z6836 Body mass index (BMI) 36.0-36.9, adult: Secondary | ICD-10-CM | POA: Diagnosis not present

## 2019-05-15 DIAGNOSIS — E119 Type 2 diabetes mellitus without complications: Secondary | ICD-10-CM | POA: Diagnosis not present

## 2019-05-19 ENCOUNTER — Other Ambulatory Visit: Payer: Self-pay

## 2019-05-19 ENCOUNTER — Ambulatory Visit (HOSPITAL_COMMUNITY): Payer: Medicare HMO | Attending: Podiatry | Admitting: Occupational Therapy

## 2019-05-19 ENCOUNTER — Encounter (HOSPITAL_COMMUNITY): Payer: Self-pay | Admitting: Occupational Therapy

## 2019-05-19 DIAGNOSIS — R29898 Other symptoms and signs involving the musculoskeletal system: Secondary | ICD-10-CM | POA: Diagnosis not present

## 2019-05-19 DIAGNOSIS — M25511 Pain in right shoulder: Secondary | ICD-10-CM | POA: Diagnosis not present

## 2019-05-19 DIAGNOSIS — M25611 Stiffness of right shoulder, not elsewhere classified: Secondary | ICD-10-CM | POA: Diagnosis not present

## 2019-05-19 NOTE — Therapy (Signed)
[place] [facility] [address] [place], [place], [address] Phone: [phone]   Fax:  [phone]  Occupational Therapy Treatment  Patient Details  Name: Mitchell [name] MRN: [id number] Date of Birth: 10-May-1950 Referring Provider (OT): Richardean Canal, PA-C   Encounter Date: 05/19/2019  OT End of Session - 05/19/19 1727    Visit Number  3    Number of Visits  16    Date for OT Re-Evaluation  07/07/19   Mini reassessment 06/05/19   Authorization Type  [employer] Medicare, $20 copay    Authorization Time Period  No visit limit    Progress Note Due on Visit  10    OT Start Time  1645    OT Stop Time  1726    OT Time Calculation (min)  41 min    Activity Tolerance  Patient tolerated treatment well    Behavior During Therapy  [facility] for tasks assessed/performed       Past Medical History:  Diagnosis Date  . Arthritis   . Cellulitis of right lower extremity 05/09/2014  . Diabetes mellitus without complication (HCC)   . Diabetic foot ulcer (HCC) 07/07/2014   Status post bedside debridement of multiple right plantar ulcers by podiatrist, Dr. Reynolds Bowl.  . Hypercholesterolemia   . Maggot infestation    right foot ulcer  . Obesity 05/09/2014  . Tobacco abuse 07/07/2014    Past Surgical History:  Procedure Laterality Date  . APPENDECTOMY    . PERIPHERAL VASCULAR CATHETERIZATION N/A 07/22/2015   Procedure: Abdominal Aortogram w/Lower Extremity;  Surgeon: Sherren Kerns, MD;  Location: [facility] INVASIVE CV LAB;  Service: Cardiovascular;  Laterality: N/A;    There were no vitals filed for this visit.  Subjective Assessment - 05/19/19 1645    Subjective   S:    Currently in Pain?  Yes    Pain Score  5     Pain Location  Shoulder    Pain Orientation  Right    Pain Descriptors / Indicators  Sore    Pain Type  Acute pain    Pain Radiating Towards  N/A    Pain Onset  In the past 7 days    Pain Frequency  Constant    Aggravating Factors   movement and use    Pain  Relieving Factors  pain medication    Effect of Pain on Daily Activities  max effect    Multiple Pain Sites  No         [facility] OT Assessment - 05/19/19 1644      Assessment   Medical Diagnosis  right proximal humerus fracture      Precautions   Precautions  None               OT Treatments/Exercises (OP) - 05/19/19 1649      Exercises   Exercises  Shoulder      Shoulder Exercises: Supine   Protraction  PROM;5 reps;AAROM;10 reps    Horizontal ABduction  PROM;5 reps;AAROM;10 reps    External Rotation  PROM;5 reps;AAROM;10 reps    Internal Rotation  PROM;5 reps;AAROM;10 reps    Flexion  PROM;5 reps;AAROM;10 reps    ABduction  PROM;5 reps;AAROM;10 reps    ABduction Limitations  OT unweighting elbow      Shoulder Exercises: Seated   Extension  AROM;10 reps    Row  AROM;10 reps      Shoulder Exercises: Pulleys   Flexion  1 minute    ABduction  1 minute      Shoulder Exercises: ROM/Strengthening   Wall Wash  1'    Other ROM/Strengthening Exercises  pvc pipe slide; 10X      Manual Therapy   Manual Therapy  Myofascial release    Manual therapy comments  Manual therapy completed prior to exercises.     Myofascial Release  myofascial release and manual stretching completed to right upper arm, trapezius, and scapularis region to decrease fascial restrictions and increase joint mobility in a pain free zone.                OT Short Term Goals - 05/13/19 1647      OT SHORT TERM GOAL #1   Title  Pt will be provided with and educated on HEP to improve mobility required for ADL completion using RUE.    Time  4    Period  Weeks    Status  On-going    Target Date  06/07/19      OT SHORT TERM GOAL #2   Title  Pt will increase RUE P/ROM to [facility] to improve ability to use RUE as assist during dressing tasks.    Time  4    Period  Weeks    Status  On-going      OT SHORT TERM GOAL #3   Title  Pt will improve RUE strength to 3+/5 to improve ability to reach items  at waist to chest height.    Time  4    Period  Weeks    Status  On-going        OT Long Term Goals - 05/13/19 1647      OT LONG TERM GOAL #1   Title  Pt will decrease pain in RUE to 2/10 or less to improve ability to use RUE as dominant during ADL completion.    Time  8    Period  Weeks    Status  On-going      OT LONG TERM GOAL #2   Title  Pt will increase RUE A/ROM to [facility] to improve ability to reach overhead into cabinets when completing housework.    Time  8    Period  Weeks    Status  On-going      OT LONG TERM GOAL #3   Title  Pt will decrease RUE fascial restrictions to minimal amounts or less to improve mobility required for functional reaching tasks.    Time  8    Period  Weeks    Status  On-going      OT LONG TERM GOAL #4   Title  Pt will increase RUE strength to 4/5 or greater to improve ability to perform fishing activities.    Time  8    Period  Weeks    Status  On-going            Plan - 05/19/19 1703    Clinical Impression Statement  A: Pt reports he is able to reach up and get a coffee cup now. Continued with manual techniques to address fascial restrictions. Pt able to tolerate ROM to 50% during passive stretching. Progressed to AA/ROM today with pt gaining ROM slightly greater than 50%. Added pulleys and wall wash. Verbal cuing for form and technique.    Body Structure / Function / Physical Skills  ADL;Endurance;UE functional use;Fascial restriction;Pain;ROM;IADL;Strength;Edema    Plan  P: Continue with passive stretching and AA/ROM, update for HEP if pt completing with good form  OT Home Exercise Plan  eval: table slides       Patient will benefit from skilled therapeutic intervention in order to improve the following deficits and impairments:   Body Structure / Function / Physical Skills: ADL, Endurance, UE functional use, Fascial restriction, Pain, ROM, IADL, Strength, Edema       Visit Diagnosis: Stiffness of right shoulder, not  elsewhere classified  Other symptoms and signs involving the musculoskeletal system  Acute pain of right shoulder    Problem List Patient Active Problem List   Diagnosis Date Noted  . Closed fracture of proximal end of right humerus with routine healing 02/25/2019  . Pre-ulcerative calluses 04/20/2018  . 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 Diabetic foot ulcer associated with type 2 diabetes mellitus (HCC) 07/07/2014  . Tobacco abuse 07/07/2014  . Infestation, maggots 07/07/2014  . Plantar ulcer of right foot (HCC) 05/12/2014  . Pulse weakness   . Peripheral neuropathy   . Cellulitis of right lower extremity 05/09/2014  . Obesity 05/09/2014  . Cellulitis of right lower leg 05/09/2014  . Hyperglycemia 03/21/2013  . IDDM (insulin dependent diabetes mellitus) 03/21/2013  . Noncompliance with medications due to cost issues 03/21/2013   Ezra Sites, OTR/L  [phone] 05/19/2019, 5:28 PM  [facility] [address] [place], [place], [address] Phone: [phone]   Fax:  [phone]  Name: Damieon Armendariz MRN: [id number] Date of Birth: 1950/06/24

## 2019-05-20 DIAGNOSIS — Z6841 Body Mass Index (BMI) 40.0 and over, adult: Secondary | ICD-10-CM | POA: Diagnosis not present

## 2019-05-20 DIAGNOSIS — E1165 Type 2 diabetes mellitus with hyperglycemia: Secondary | ICD-10-CM | POA: Diagnosis not present

## 2019-05-20 DIAGNOSIS — E1142 Type 2 diabetes mellitus with diabetic polyneuropathy: Secondary | ICD-10-CM | POA: Diagnosis not present

## 2019-05-20 DIAGNOSIS — F17210 Nicotine dependence, cigarettes, uncomplicated: Secondary | ICD-10-CM | POA: Diagnosis not present

## 2019-05-20 DIAGNOSIS — I83009 Varicose veins of unspecified lower extremity with ulcer of unspecified site: Secondary | ICD-10-CM | POA: Diagnosis not present

## 2019-05-20 DIAGNOSIS — R69 Illness, unspecified: Secondary | ICD-10-CM | POA: Diagnosis not present

## 2019-05-20 DIAGNOSIS — Z299 Encounter for prophylactic measures, unspecified: Secondary | ICD-10-CM | POA: Diagnosis not present

## 2019-05-20 DIAGNOSIS — S98131A Complete traumatic amputation of one right lesser toe, initial encounter: Secondary | ICD-10-CM | POA: Diagnosis not present

## 2019-05-21 ENCOUNTER — Ambulatory Visit (HOSPITAL_COMMUNITY): Payer: Medicare HMO | Admitting: Occupational Therapy

## 2019-05-21 ENCOUNTER — Encounter (HOSPITAL_COMMUNITY): Payer: Self-pay | Admitting: Occupational Therapy

## 2019-05-21 ENCOUNTER — Other Ambulatory Visit: Payer: Self-pay

## 2019-05-21 DIAGNOSIS — M25511 Pain in right shoulder: Secondary | ICD-10-CM | POA: Diagnosis not present

## 2019-05-21 DIAGNOSIS — M25611 Stiffness of right shoulder, not elsewhere classified: Secondary | ICD-10-CM

## 2019-05-21 DIAGNOSIS — R29898 Other symptoms and signs involving the musculoskeletal system: Secondary | ICD-10-CM

## 2019-05-21 DIAGNOSIS — E1165 Type 2 diabetes mellitus with hyperglycemia: Secondary | ICD-10-CM | POA: Diagnosis not present

## 2019-05-21 DIAGNOSIS — Z299 Encounter for prophylactic measures, unspecified: Secondary | ICD-10-CM | POA: Diagnosis not present

## 2019-05-21 NOTE — Therapy (Signed)
[facility] [address] [place], [place], [address] Phone: [phone]   Fax:  [phone]  Occupational Therapy Treatment  Patient Details  Name: Mitchell [name] MRN: [id number] Date of Birth: March 30, 1950 Referring Provider (OT): Richardean Canal, PA-C   Encounter Date: 05/21/2019  OT End of Session - 05/21/19 1518    Visit Number  4    Number of Visits  16    Date for OT Re-Evaluation  07/07/19   Mini reassessment 06/05/19   Authorization Type  [employer] Medicare, $20 copay    Authorization Time Period  No visit limit    Progress Note Due on Visit  10    OT Start Time  1433    OT Stop Time  1512    OT Time Calculation (min)  39 min    Activity Tolerance  Patient tolerated treatment well    Behavior During Therapy  [facility] for tasks assessed/performed       Past Medical History:  Diagnosis Date  . Arthritis   . Cellulitis of right lower extremity 05/09/2014  . Diabetes mellitus without complication (HCC)   . Diabetic foot ulcer (HCC) 07/07/2014   Status post bedside debridement of multiple right plantar ulcers by podiatrist, Dr. Reynolds Bowl.  . Hypercholesterolemia   . Maggot infestation    right foot ulcer  . Obesity 05/09/2014  . Tobacco abuse 07/07/2014    Past Surgical History:  Procedure Laterality Date  . APPENDECTOMY    . PERIPHERAL VASCULAR CATHETERIZATION N/A 07/22/2015   Procedure: Abdominal Aortogram w/Lower Extremity;  Surgeon: Sherren Kerns, MD;  Location: [facility] INVASIVE CV LAB;  Service: Cardiovascular;  Laterality: N/A;    There were no vitals filed for this visit.  Subjective Assessment - 05/21/19 1434    Subjective   S: It's sore today, especially behind my shoulder blade.    Currently in Pain?  Yes    Pain Score  6     Pain Location  Shoulder    Pain Orientation  Right    Pain Descriptors / Indicators  Sore    Pain Type  Acute pain    Pain Radiating Towards  N/A    Pain Onset  In the past 7 days    Pain Frequency  Constant     Aggravating Factors   movement, use    Pain Relieving Factors  pain medication    Effect of Pain on Daily Activities  mod to max effect    Multiple Pain Sites  No         [facility] OT Assessment - 05/21/19 1434      Assessment   Medical Diagnosis  right proximal humerus fracture      Precautions   Precautions  None               OT Treatments/Exercises (OP) - 05/21/19 1435      Exercises   Exercises  Shoulder      Shoulder Exercises: Supine   Protraction  PROM;5 reps;AAROM;10 reps    Horizontal ABduction  PROM;5 reps;AAROM;10 reps    External Rotation  PROM;5 reps;AAROM;10 reps    Internal Rotation  PROM;5 reps;AAROM;10 reps    Flexion  PROM;5 reps;AAROM;10 reps    ABduction  PROM;5 reps;AAROM;10 reps      Shoulder Exercises: Seated   Row  AROM;10 reps    Protraction  AAROM;10 reps    Horizontal ABduction  AAROM;10 reps    External Rotation  AAROM;10 reps  Internal Rotation  AAROM;10 reps    Flexion  AAROM;10 reps    Abduction  AAROM;10 reps      Shoulder Exercises: ROM/Strengthening   Wall Wash  1'    Other ROM/Strengthening Exercises  pvc pipe slide; 10X      Manual Therapy   Manual Therapy  Myofascial release    Manual therapy comments  Manual therapy completed prior to exercises.     Myofascial Release  myofascial release and manual stretching completed to right upper arm, trapezius, and scapularis region to decrease fascial restrictions and increase joint mobility in a pain free zone.                OT Short Term Goals - 05/13/19 1647      OT SHORT TERM GOAL #1   Title  Pt will be provided with and educated on HEP to improve mobility required for ADL completion using RUE.    Time  4    Period  Weeks    Status  On-going    Target Date  06/07/19      OT SHORT TERM GOAL #2   Title  Pt will increase RUE P/ROM to [facility] to improve ability to use RUE as assist during dressing tasks.    Time  4    Period  Weeks    Status  On-going      OT  SHORT TERM GOAL #3   Title  Pt will improve RUE strength to 3+/5 to improve ability to reach items at waist to chest height.    Time  4    Period  Weeks    Status  On-going        OT Long Term Goals - 05/13/19 1647      OT LONG TERM GOAL #1   Title  Pt will decrease pain in RUE to 2/10 or less to improve ability to use RUE as dominant during ADL completion.    Time  8    Period  Weeks    Status  On-going      OT LONG TERM GOAL #2   Title  Pt will increase RUE A/ROM to [facility] to improve ability to reach overhead into cabinets when completing housework.    Time  8    Period  Weeks    Status  On-going      OT LONG TERM GOAL #3   Title  Pt will decrease RUE fascial restrictions to minimal amounts or less to improve mobility required for functional reaching tasks.    Time  8    Period  Weeks    Status  On-going      OT LONG TERM GOAL #4   Title  Pt will increase RUE strength to 4/5 or greater to improve ability to perform fishing activities.    Time  8    Period  Weeks    Status  On-going            Plan - 05/21/19 1518    Clinical Impression Statement  A: Pt reports soreness in right shoulder region today. Continued with manual therapy to address fascial restrictions and trialed massage gun for ~2 minutes which pt enjoyed. Continued with AA/ROM and added in sitting. OT notes improvement in ROM during AA/ROM and pvc pipe slide exercises. Verbal cuing for form and technique.    Body Structure / Function / Physical Skills  ADL;Endurance;UE functional use;Fascial restriction;Pain;ROM;IADL;Strength;Edema    Plan  P: Update HEP, attempt  scapular theraband    OT Home Exercise Plan  eval: table slides    Consulted and Agree with Plan of Care  Patient       Patient will benefit from skilled therapeutic intervention in order to improve the following deficits and impairments:   Body Structure / Function / Physical Skills: ADL, Endurance, UE functional use, Fascial restriction,  Pain, ROM, IADL, Strength, Edema       Visit Diagnosis: Stiffness of right shoulder, not elsewhere classified  Other symptoms and signs involving the musculoskeletal system  Acute pain of right shoulder    Problem List Patient Active Problem List   Diagnosis Date Noted  . Closed fracture of proximal end of right humerus with routine healing 02/25/2019  . Pre-ulcerative calluses 04/20/2018  . 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 Diabetic foot ulcer associated with type 2 diabetes mellitus ([place]) 07/07/2014  . Tobacco abuse 07/07/2014  . Infestation, maggots 07/07/2014  . Plantar ulcer of right foot ([place]) 05/12/2014  . Pulse weakness   . Peripheral neuropathy   . Cellulitis of right lower extremity 05/09/2014  . Obesity 05/09/2014  . Cellulitis of right lower leg 05/09/2014  . Hyperglycemia 03/21/2013  . IDDM (insulin dependent diabetes mellitus) 03/21/2013  . Noncompliance with medications due to cost issues 03/21/2013   Guadelupe Sabin, OTR/L  [phone] 05/21/2019, 3:20 PM  [place], [place], [address] Phone: [phone]   Fax:  [phone]  Name: Jawon Dipiero MRN: [id number] Date of Birth: 02-Jul-1950

## 2019-05-26 ENCOUNTER — Encounter (HOSPITAL_COMMUNITY): Payer: Self-pay

## 2019-05-26 ENCOUNTER — Other Ambulatory Visit: Payer: Self-pay

## 2019-05-26 ENCOUNTER — Ambulatory Visit (HOSPITAL_COMMUNITY): Payer: Medicare HMO

## 2019-05-26 DIAGNOSIS — M25511 Pain in right shoulder: Secondary | ICD-10-CM | POA: Diagnosis not present

## 2019-05-26 DIAGNOSIS — R29898 Other symptoms and signs involving the musculoskeletal system: Secondary | ICD-10-CM

## 2019-05-26 DIAGNOSIS — M25611 Stiffness of right shoulder, not elsewhere classified: Secondary | ICD-10-CM | POA: Diagnosis not present

## 2019-05-26 NOTE — Therapy (Addendum)
[place], [place], [address] Phone: [phone]   Fax:  [phone]  Occupational Therapy Treatment  Patient Details  Name: Mitchell [name] MRN: [id number] Date of Birth: 12-Jul-1950 Referring Provider (OT): Erskine Emery, PA-C   Encounter Date: 05/26/2019   Time: 1093-2355 OT End of Session - 05/26/19 7322    Visit Number  5    Number of Visits  17    Date for OT Re-Evaluation  07/07/19    Authorization Type  [employer] Medicare, $20 copay    Authorization Time Period  No visit limit    Progress Note Due on Visit  10    Activity Tolerance  Patient tolerated treatment well    Behavior During Therapy  [facility] for tasks assessed/performed       Past Medical History:  Diagnosis Date  . Arthritis   . Cellulitis of right lower extremity 05/09/2014  . Diabetes mellitus without complication ([place])   . Diabetic foot ulcer ([place]) 07/07/2014   Status post bedside debridement of multiple right plantar ulcers by podiatrist, Dr. Laurena Spies.  . Hypercholesterolemia   . Maggot infestation    right foot ulcer  . Obesity 05/09/2014  . Tobacco abuse 07/07/2014    Past Surgical History:  Procedure Laterality Date  . APPENDECTOMY    . PERIPHERAL VASCULAR CATHETERIZATION N/A 07/22/2015   Procedure: Abdominal Aortogram w/Lower Extremity;  Surgeon: Elam Dutch, MD;  Location: [place] CV LAB;  Service: Cardiovascular;  Laterality: N/A;    There were no vitals filed for this visit.  Subjective Assessment - 05/26/19 1708    Subjective   S: "Doesn't feel as sore after I stretch it."    Pain Score  6     Pain Location  Shoulder    Pain Orientation  Right    Pain Descriptors / Indicators  Sore    Pain Type  Acute pain         [facility] OT Assessment - 05/26/19 1745      Assessment   Medical Diagnosis  right proximal humerus fracture      Precautions   Precautions  None               OT Treatments/Exercises (OP) - 05/26/19  1359      Exercises   Exercises  Shoulder      Shoulder Exercises: Supine   Horizontal ABduction  PROM;Right;10 reps    External Rotation  PROM;Right;10 reps    Internal Rotation  PROM;Right;10 reps    Flexion  PROM;Right;5 reps    ABduction  PROM;Right;10 reps    ABduction Limitations  OT unweighting elbow      Shoulder Exercises: Seated   Protraction  AAROM;10 reps    Horizontal ABduction  AAROM;10 reps    External Rotation  AAROM;10 reps   with towel roll   Internal Rotation  AAROM;10 reps   wuth towel roll   Flexion  AAROM;10 reps    Abduction  AAROM;10 reps      Shoulder Exercises: Standing   ABduction  AAROM;10 reps    Row  Theraband;10 reps    Theraband Level (Shoulder Row)  Level 2 (Red)    Retraction  Theraband;10 reps    Theraband Level (Shoulder Retraction)  Level 2 (Red)      Shoulder Exercises: Pulleys   Flexion  1 minute;Other (comment)   standing   ABduction  1 minute;Other (comment)   standing     Manual Therapy  Manual Therapy  Myofascial release;Scapular mobilization    Manual therapy comments  Manual therapy completed prior to exercises.     Myofascial Release  myofascial release and manual stretching completed to right upper arm, trapezius, and scapularis region to decrease fascial restrictions and increase joint mobility in a pain free zone.     Scapular Mobilization  Completed scapular mobilization supine during passive stretching.              OT Education - 05/26/19 1748    Education Details  Bicept stretching with weight bearing while seated for performing at home.    Person(s) Educated  Patient    Methods  Explanation    Comprehension  Verbalized understanding;Returned demonstration       OT Short Term Goals - 05/13/19 1647      OT SHORT TERM GOAL #1   Title  Pt will be provided with and educated on HEP to improve mobility required for ADL completion using RUE.    Time  4    Period  Weeks    Status  On-going    Target Date   06/07/19      OT SHORT TERM GOAL #2   Title  Pt will increase RUE P/ROM to [facility] to improve ability to use RUE as assist during dressing tasks.    Time  4    Period  Weeks    Status  On-going      OT SHORT TERM GOAL #3   Title  Pt will improve RUE strength to 3+/5 to improve ability to reach items at waist to chest height.    Time  4    Period  Weeks    Status  On-going        OT Long Term Goals - 05/13/19 1647      OT LONG TERM GOAL #1   Title  Pt will decrease pain in RUE to 2/10 or less to improve ability to use RUE as dominant during ADL completion.    Time  8    Period  Weeks    Status  On-going      OT LONG TERM GOAL #2   Title  Pt will increase RUE A/ROM to [facility] to improve ability to reach overhead into cabinets when completing housework.    Time  8    Period  Weeks    Status  On-going      OT LONG TERM GOAL #3   Title  Pt will decrease RUE fascial restrictions to minimal amounts or less to improve mobility required for functional reaching tasks.    Time  8    Period  Weeks    Status  On-going      OT LONG TERM GOAL #4   Title  Pt will increase RUE strength to 4/5 or greater to improve ability to perform fishing activities.    Time  8    Period  Weeks    Status  On-going            Plan - 05/26/19 1715    Clinical Impression Statement  A: Pt report soreness at R shoulder region. Continued with manual threrapy and P/ROM to address fascial restrictions. Continued AA/ROM in sitting and standing with 1lb bar. Introduced pulleys for AA/ROM and pt reporting "I can do this. I like it." Performing row and protraction in standin with red theraband.  Providing verbal cues for techniques and normalized movement patterns.    OT Occupational Profile and History  Problem Focused Assessment - Including review of records relating to presenting problem    Occupational performance deficits (Please refer to evaluation for details):  ADL's;IADL's;Leisure    Body Structure /  Function / Physical Skills  ADL;Endurance;UE functional use;Fascial restriction;Pain;ROM;IADL;Strength;Edema    Rehab Potential  Good    Plan  P: Update HEP. Conitnue manual therapy and scapular ROM. Attempt functional reaching task.    Consulted and Agree with Plan of Care  Patient       Patient will benefit from skilled therapeutic intervention in order to improve the following deficits and impairments:   Body Structure / Function / Physical Skills: ADL, Endurance, UE functional use, Fascial restriction, Pain, ROM, IADL, Strength, Edema       Visit Diagnosis: Stiffness of right shoulder, not elsewhere classified  Other symptoms and signs involving the musculoskeletal system  Acute pain of right shoulder    Problem List Patient Active Problem List   Diagnosis Date Noted  . Closed fracture of proximal end of right humerus with routine healing 02/25/2019  . Pre-ulcerative calluses 04/20/2018  . 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 Diabetic foot ulcer associated with type 2 diabetes mellitus (HCC) 07/07/2014  . Tobacco abuse 07/07/2014  . Infestation, maggots 07/07/2014  . Plantar ulcer of right foot (HCC) 05/12/2014  . Pulse weakness   . Peripheral neuropathy   . Cellulitis of right lower extremity 05/09/2014  . Obesity 05/09/2014  . Cellulitis of right lower leg 05/09/2014  . Hyperglycemia 03/21/2013  . IDDM (insulin dependent diabetes mellitus) 03/21/2013  . Noncompliance with medications due to cost issues 03/21/2013    Annikah Lovins M Gavynn Duvall Aamina Skiff MSOT, OTR/L Acute Rehab and OP Rehab Pager: [phone] 05/26/2019, 5:52 PM  [place] [facility] [address] [place], [place], [address] Phone: [phone]   Fax:  [phone]  Name: Mitchell [name] MRN: [id number] Date of Birth: [birth year]

## 2019-05-28 ENCOUNTER — Other Ambulatory Visit: Payer: Self-pay

## 2019-05-28 ENCOUNTER — Ambulatory Visit (HOSPITAL_COMMUNITY): Payer: Medicare HMO | Admitting: Occupational Therapy

## 2019-05-28 ENCOUNTER — Encounter (HOSPITAL_COMMUNITY): Payer: Self-pay | Admitting: Occupational Therapy

## 2019-05-28 DIAGNOSIS — M25611 Stiffness of right shoulder, not elsewhere classified: Secondary | ICD-10-CM | POA: Diagnosis not present

## 2019-05-28 DIAGNOSIS — M25511 Pain in right shoulder: Secondary | ICD-10-CM | POA: Diagnosis not present

## 2019-05-28 DIAGNOSIS — R29898 Other symptoms and signs involving the musculoskeletal system: Secondary | ICD-10-CM

## 2019-05-28 NOTE — Therapy (Signed)
[facility] [address] [place], [place], [address] Phone: [phone]   Fax:  [phone]  Occupational Therapy Treatment  Patient Details  Name: Mitchell [name] MRN: [id number] Date of Birth: December 22, 1950 Referring Provider (OT): Richardean Canal, PA-C   Encounter Date: 05/28/2019  OT End of Session - 05/28/19 1157    Visit Number  6    Number of Visits  17    Date for OT Re-Evaluation  07/07/19    Authorization Type  [employer] Medicare, $20 copay    Authorization Time Period  No visit limit    Progress Note Due on Visit  10    OT Start Time  1115    OT Stop Time  1156    OT Time Calculation (min)  41 min    Activity Tolerance  Patient tolerated treatment well    Behavior During Therapy  [facility] for tasks assessed/performed       Past Medical History:  Diagnosis Date  . Arthritis   . Cellulitis of right lower extremity 05/09/2014  . Diabetes mellitus without complication (HCC)   . Diabetic foot ulcer (HCC) 07/07/2014   Status post bedside debridement of multiple right plantar ulcers by podiatrist, Dr. Reynolds Bowl.  . Hypercholesterolemia   . Maggot infestation    right foot ulcer  . Obesity 05/09/2014  . Tobacco abuse 07/07/2014    Past Surgical History:  Procedure Laterality Date  . APPENDECTOMY    . PERIPHERAL VASCULAR CATHETERIZATION N/A 07/22/2015   Procedure: Abdominal Aortogram w/Lower Extremity;  Surgeon: Sherren Kerns, MD;  Location: [facility] INVASIVE CV LAB;  Service: Cardiovascular;  Laterality: N/A;    There were no vitals filed for this visit.  Subjective Assessment - 05/28/19 1117    Subjective   S: I drove myself here today.    Currently in Pain?  Yes    Pain Score  5     Pain Location  Shoulder    Pain Orientation  Right    Pain Descriptors / Indicators  Sore    Pain Type  Acute pain    Pain Radiating Towards  N/A    Pain Onset  1 to 4 weeks ago    Pain Frequency  Intermittent    Aggravating Factors   movement, use    Pain  Relieving Factors  pain medication    Effect of Pain on Daily Activities  mod effect on ADLs    Multiple Pain Sites  No         [facility] OT Assessment - 05/28/19 1116      Assessment   Medical Diagnosis  right proximal humerus fracture      Precautions   Precautions  None               OT Treatments/Exercises (OP) - 05/28/19 1117      Exercises   Exercises  Shoulder      Shoulder Exercises: Supine   Protraction  PROM;5 reps;AAROM;12 reps    Horizontal ABduction  PROM;5 reps;AAROM;12 reps    External Rotation  PROM;5 reps;AAROM;12 reps    Internal Rotation  PROM;5 reps;AAROM;12 reps    Flexion  PROM;5 reps;AAROM;12 reps    ABduction  PROM;5 reps;AAROM;12 reps      Shoulder Exercises: Seated   Row  AROM;10 reps    Protraction  AAROM;10 reps    Horizontal ABduction  AAROM;10 reps    External Rotation  AAROM;10 reps    Internal Rotation  AAROM;10 reps  Flexion  AAROM;10 reps    Abduction  AAROM;10 reps      Functional Reaching Activities   Mid Level  Pt completing pinch tree activity, placing 24 clothespins on tree.       Manual Therapy   Manual Therapy  Myofascial release    Manual therapy comments  Manual therapy completed prior to exercises.     Myofascial Release  myofascial release and manual stretching completed to right upper arm, trapezius, and scapularis region to decrease fascial restrictions and increase joint mobility in a pain free zone.                OT Short Term Goals - 05/13/19 1647      OT SHORT TERM GOAL #1   Title  Pt will be provided with and educated on HEP to improve mobility required for ADL completion using RUE.    Time  4    Period  Weeks    Status  On-going    Target Date  06/07/19      OT SHORT TERM GOAL #2   Title  Pt will increase RUE P/ROM to [facility] to improve ability to use RUE as assist during dressing tasks.    Time  4    Period  Weeks    Status  On-going      OT SHORT TERM GOAL #3   Title  Pt will improve RUE  strength to 3+/5 to improve ability to reach items at waist to chest height.    Time  4    Period  Weeks    Status  On-going        OT Long Term Goals - 05/13/19 1647      OT LONG TERM GOAL #1   Title  Pt will decrease pain in RUE to 2/10 or less to improve ability to use RUE as dominant during ADL completion.    Time  8    Period  Weeks    Status  On-going      OT LONG TERM GOAL #2   Title  Pt will increase RUE A/ROM to [facility] to improve ability to reach overhead into cabinets when completing housework.    Time  8    Period  Weeks    Status  On-going      OT LONG TERM GOAL #3   Title  Pt will decrease RUE fascial restrictions to minimal amounts or less to improve mobility required for functional reaching tasks.    Time  8    Period  Weeks    Status  On-going      OT LONG TERM GOAL #4   Title  Pt will increase RUE strength to 4/5 or greater to improve ability to perform fishing activities.    Time  8    Period  Weeks    Status  On-going            Plan - 05/28/19 1157    Clinical Impression Statement  A: Continued with manual therapy to address fascial restrictions. Pt with improved tolerance and ROM during passive stretching. Continued with AA/ROM and added functional reaching task today. Rest break provided as needed for fatigue. Verbal cuing for form and technique during exercises.    Body Structure / Function / Physical Skills  ADL;Endurance;UE functional use;Fascial restriction;Pain;ROM;IADL;Strength;Edema    Plan  P: Resume pulleys, update HEP for AA/ROM, continue with functional reaching    OT Home Exercise Plan  eval: table slides  Consulted and Agree with Plan of Care  Patient       Patient will benefit from skilled therapeutic intervention in order to improve the following deficits and impairments:   Body Structure / Function / Physical Skills: ADL, Endurance, UE functional use, Fascial restriction, Pain, ROM, IADL, Strength, Edema       Visit  Diagnosis: Stiffness of right shoulder, not elsewhere classified  Other symptoms and signs involving the musculoskeletal system  Acute pain of right shoulder    Problem List Patient Active Problem List   Diagnosis Date Noted  . Closed fracture of proximal end of right humerus with routine healing 02/25/2019  . Pre-ulcerative calluses 04/20/2018  . 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 Diabetic foot ulcer associated with type 2 diabetes mellitus (HCC) 07/07/2014  . Tobacco abuse 07/07/2014  . Infestation, maggots 07/07/2014  . Plantar ulcer of right foot (HCC) 05/12/2014  . Pulse weakness   . Peripheral neuropathy   . Cellulitis of right lower extremity 05/09/2014  . Obesity 05/09/2014  . Cellulitis of right lower leg 05/09/2014  . Hyperglycemia 03/21/2013  . IDDM (insulin dependent diabetes mellitus) 03/21/2013  . Noncompliance with medications due to cost issues 03/21/2013   Ezra Sites, OTR/L  [phone] 05/28/2019, 11:59 AM  [place] [facility] [address] [place], [place], [address] Phone: [phone]   Fax:  [phone]  Name: Mitchell [name] MRN: [id number] Date of Birth: 1950/07/08

## 2019-05-29 DIAGNOSIS — M8468XA Pathological fracture in other disease, other site, initial encounter for fracture: Secondary | ICD-10-CM | POA: Diagnosis not present

## 2019-06-03 ENCOUNTER — Ambulatory Visit (HOSPITAL_COMMUNITY): Payer: Medicare HMO | Admitting: Occupational Therapy

## 2019-06-03 ENCOUNTER — Encounter (HOSPITAL_COMMUNITY): Payer: Self-pay | Admitting: Occupational Therapy

## 2019-06-03 ENCOUNTER — Other Ambulatory Visit: Payer: Self-pay

## 2019-06-03 DIAGNOSIS — M25611 Stiffness of right shoulder, not elsewhere classified: Secondary | ICD-10-CM | POA: Diagnosis not present

## 2019-06-03 DIAGNOSIS — R29898 Other symptoms and signs involving the musculoskeletal system: Secondary | ICD-10-CM

## 2019-06-03 DIAGNOSIS — M25511 Pain in right shoulder: Secondary | ICD-10-CM | POA: Diagnosis not present

## 2019-06-03 NOTE — Therapy (Signed)
[place], [place], [address] Phone: [phone]   Fax:  [phone]  Occupational Therapy Treatment  Patient Details  Name: Mitchell [name] MRN: [id number] Date of Birth: 08-20-1950 Referring Provider (OT): Erskine Emery, PA-C   Encounter Date: 06/03/2019  OT End of Session - 06/03/19 1148    Visit Number  7    Number of Visits  17    Date for OT Re-Evaluation  07/07/19    Authorization Type  [employer] Medicare, $20 copay    Authorization Time Period  No visit limit    Progress Note Due on Visit  10    OT Start Time  1116    OT Stop Time  1158    OT Time Calculation (min)  42 min    Activity Tolerance  Patient tolerated treatment well    Behavior During Therapy  [facility] for tasks assessed/performed       Past Medical History:  Diagnosis Date  . Arthritis   . Cellulitis of right lower extremity 05/09/2014  . Diabetes mellitus without complication ([place])   . Diabetic foot ulcer ([place]) 07/07/2014   Status post bedside debridement of multiple right plantar ulcers by podiatrist, Dr. Laurena Spies.  . Hypercholesterolemia   . Maggot infestation    right foot ulcer  . Obesity 05/09/2014  . Tobacco abuse 07/07/2014    Past Surgical History:  Procedure Laterality Date  . APPENDECTOMY    . PERIPHERAL VASCULAR CATHETERIZATION N/A 07/22/2015   Procedure: Abdominal Aortogram w/Lower Extremity;  Surgeon: Elam Dutch, MD;  Location: [place] [facility];  Service: Cardiovascular;  Laterality: N/A;    There were no vitals filed for this visit.  Subjective Assessment - 06/03/19 1118    Subjective   S: I can do a little more movements.    Currently in Pain?  No/denies         [facility] OT Assessment - 06/03/19 1117      Assessment   Medical Diagnosis  right proximal humerus fracture      Precautions   Precautions  None               OT Treatments/Exercises (OP) - 06/03/19 1118      Exercises   Exercises  Shoulder      Shoulder Exercises: Supine   Protraction  PROM;5 reps;AROM;10 reps    Horizontal ABduction  PROM;5 reps;AAROM;12 reps    External Rotation  PROM;5 reps;AAROM;12 reps    Internal Rotation  PROM;5 reps;AAROM;12 reps    Flexion  PROM;5 reps;AAROM;12 reps    ABduction  PROM;5 reps;AAROM;12 reps      Modalities   Modalities  Electrical Stimulation;Moist Heat      Moist Heat Therapy   Number Minutes Moist Heat  15 Minutes    Moist Heat Location  Shoulder      Electrical Stimulation   Electrical Stimulation Location  right shoulder    Electrical Stimulation Action  interferential    Electrical Stimulation Parameters  22 CV for 5 min, reduced to 19 CV for 5 min, reduced to 14 CV for remaining 5 mins    Electrical Stimulation Goals  Pain      Manual Therapy   Manual Therapy  Myofascial release    Manual therapy comments  Manual therapy completed prior to exercises.     Myofascial Release  myofascial release and manual stretching completed to right upper arm, trapezius, and scapularis region to decrease fascial restrictions and increase  joint mobility in a pain free zone.                OT Short Term Goals - 05/13/19 1647      OT SHORT TERM GOAL #1   Title  Pt will be provided with and educated on HEP to improve mobility required for ADL completion using RUE.    Time  4    Period  Weeks    Status  On-going    Target Date  06/07/19      OT SHORT TERM GOAL #2   Title  Pt will increase RUE P/ROM to [facility] to improve ability to use RUE as assist during dressing tasks.    Time  4    Period  Weeks    Status  On-going      OT SHORT TERM GOAL #3   Title  Pt will improve RUE strength to 3+/5 to improve ability to reach items at waist to chest height.    Time  4    Period  Weeks    Status  On-going        OT Long Term Goals - 05/13/19 1647      OT LONG TERM GOAL #1   Title  Pt will decrease pain in RUE to 2/10 or less to improve ability to use RUE as dominant during ADL  completion.    Time  8    Period  Weeks    Status  On-going      OT LONG TERM GOAL #2   Title  Pt will increase RUE A/ROM to [facility] to improve ability to reach overhead into cabinets when completing housework.    Time  8    Period  Weeks    Status  On-going      OT LONG TERM GOAL #3   Title  Pt will decrease RUE fascial restrictions to minimal amounts or less to improve mobility required for functional reaching tasks.    Time  8    Period  Weeks    Status  On-going      OT LONG TERM GOAL #4   Title  Pt will increase RUE strength to 4/5 or greater to improve ability to perform fishing activities.    Time  8    Period  Weeks    Status  On-going            Plan - 06/03/19 1146    Clinical Impression Statement  A: Continued with manual therapy to address fascial restrictions in trapezius and anterior shoulder. Progress to A/ROM protraction supine, remaining exercises AA/ROM. Pt reports significantly increased pain after supine exercises today, ES applied for pain management. Verbal cuing for form and technique during exercises. Pt reports pain down to 5/10 from 9/10 prior to ES.    Body Structure / Function / Physical Skills  ADL;Endurance;UE functional use;Fascial restriction;Pain;ROM;IADL;Strength;Edema    Plan  P: resume pulleys, continue with functional reaching, update HEP for AA/ROM if pt with good tolerance during session    OT Home Exercise Plan  eval: table slides    Consulted and Agree with Plan of Care  Patient       Patient will benefit from skilled therapeutic intervention in order to improve the following deficits and impairments:   Body Structure / Function / Physical Skills: ADL, Endurance, UE functional use, Fascial restriction, Pain, ROM, IADL, Strength, Edema       Visit Diagnosis: Stiffness of right shoulder, not elsewhere classified  Other symptoms and signs involving the musculoskeletal system  Acute pain of right shoulder    Problem List Patient  Active Problem List   Diagnosis Date Noted  . Closed fracture of proximal end of right humerus with routine healing 02/25/2019  . Pre-ulcerative calluses 04/20/2018  . 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 Diabetic foot ulcer associated with type 2 diabetes mellitus (HCC) 07/07/2014  . Tobacco abuse 07/07/2014  . Infestation, maggots 07/07/2014  . Plantar ulcer of right foot (HCC) 05/12/2014  . Pulse weakness   . Peripheral neuropathy   . Cellulitis of right lower extremity 05/09/2014  . Obesity 05/09/2014  . Cellulitis of right lower leg 05/09/2014  . Hyperglycemia 03/21/2013  . IDDM (insulin dependent diabetes mellitus) 03/21/2013  . Noncompliance with medications due to cost issues 03/21/2013   Ezra Sites, OTR/L  [phone] 06/03/2019, 12:03 PM  [facility] [address] [place], [place], [address] Phone: [phone]   Fax:  [phone]  Name: Mitchell [name] MRN: [id number] Date of Birth: [birth year]

## 2019-06-05 ENCOUNTER — Other Ambulatory Visit: Payer: Self-pay

## 2019-06-05 ENCOUNTER — Ambulatory Visit (HOSPITAL_COMMUNITY): Payer: Medicare HMO | Admitting: Occupational Therapy

## 2019-06-05 ENCOUNTER — Encounter (HOSPITAL_COMMUNITY): Payer: Self-pay | Admitting: Occupational Therapy

## 2019-06-05 DIAGNOSIS — M25511 Pain in right shoulder: Secondary | ICD-10-CM | POA: Diagnosis not present

## 2019-06-05 DIAGNOSIS — R29898 Other symptoms and signs involving the musculoskeletal system: Secondary | ICD-10-CM

## 2019-06-05 DIAGNOSIS — M25611 Stiffness of right shoulder, not elsewhere classified: Secondary | ICD-10-CM

## 2019-06-05 NOTE — Patient Instructions (Signed)

## 2019-06-05 NOTE — Therapy (Addendum)
[place], [place], [address] Phone: [phone]   Fax:  [phone]  Occupational Therapy Treatment  Patient Details  Name: Mitchell [name] MRN: [id number] Date of Birth: 1950/05/04 Referring Provider (OT): Erskine Emery, PA-C   Encounter Date: 06/05/2019  OT End of Session - 06/05/19 1611    Visit Number  8    Number of Visits  17   Date for OT Re-Evaluation  07/07/19    Authorization Type  [employer] Medicare, $20 copay    Authorization Time Period  No visit limit    Progress Note Due on Visit  10    OT Start Time  1431    OT Stop Time  1515    OT Time Calculation (min)  44 min    Activity Tolerance  Patient tolerated treatment well    Behavior During Therapy  [facility] for tasks assessed/performed       Past Medical History:  Diagnosis Date  . Arthritis   . Cellulitis of right lower extremity 05/09/2014  . Diabetes mellitus without complication ([place])   . Diabetic foot ulcer ([place]) 07/07/2014   Status post bedside debridement of multiple right plantar ulcers by podiatrist, Dr. Laurena Spies.  . Hypercholesterolemia   . Maggot infestation    right foot ulcer  . Obesity 05/09/2014  . Tobacco abuse 07/07/2014    Past Surgical History:  Procedure Laterality Date  . APPENDECTOMY    . PERIPHERAL VASCULAR CATHETERIZATION N/A 07/22/2015   Procedure: Abdominal Aortogram w/Lower Extremity;  Surgeon: Elam Dutch, MD;  Location: [place] [facility];  Service: Cardiovascular;  Laterality: N/A;    There were no vitals filed for this visit.  Subjective Assessment - 06/05/19 1433    Subjective   S: I am feel alot better than I was last time    Pertinent History  Pt is a 69 y/o male s/p right proximal humerus fx sustained on 02/13/19 after falling at a restaurant. Pt reports he has very limited use of his RUE. Pt was referred to occupational therapy for evaluation and treatment by Erskine Emery, PA-C.    Currently in Pain?  Yes    Pain  Score  3     Pain Location  Shoulder    Pain Orientation  Right    Pain Descriptors / Indicators  Sore    Pain Type  Acute pain    Pain Radiating Towards  NA                   OT Treatments/Exercises (OP) - 06/05/19 1433      Exercises   Exercises  Shoulder      Shoulder Exercises: Supine   Protraction  AAROM;10 reps;Other (comment)   with green therapy ball   Horizontal ABduction  PROM;5 reps;AAROM;10 reps   green therapy ball   External Rotation  PROM;10 reps    Internal Rotation  PROM;10 reps    Flexion  PROM;5 reps;AAROM;10 reps   green therapy ball   ABduction  PROM;5 reps      Shoulder Exercises: Seated   Extension  AROM;10 reps   PVC   Retraction  AROM;10 reps   PVC   Protraction  AAROM;10 reps   PVC   Horizontal ABduction  AAROM;10 reps   PVC   External Rotation  AAROM;10 reps   PVC   Internal Rotation  AAROM;10 reps   PVC   Flexion  AAROM;10 reps   PVC   Abduction  AAROM;10 reps   PVC     Shoulder Exercises: Pulleys   Flexion  1 minute   standing   ABduction  1 minute   standing     Shoulder Exercises: Stretch   Other Shoulder Stretches  Holding onto door handle with RUE. Turning trunk and head to left. Focused stretch on bicep for increased ROM and pain relief.       Modalities   Modalities  Moist Heat      Moist Heat Therapy   Number Minutes Moist Heat  5 Minutes    Moist Heat Location  Shoulder      Manual Therapy   Manual Therapy  Myofascial release    Manual therapy comments  Manual therapy completed prior to exercises.     Myofascial Release  myofascial release and manual stretching completed to right upper arm, trapezius, and scapularis region to decrease fascial restrictions and increase joint mobility in a pain free zone.              OT Education - 06/05/19 1610    Education Details  Provided handout for AAROM with PVC pipe for increasing ROM at RUE. Pt demonstrated and verbalized understanding.    Person(s)  Educated  Patient    Methods  Explanation;Demonstration;Handout    Comprehension  Verbalized understanding;Returned demonstration       OT Short Term Goals - 05/13/19 1647      OT SHORT TERM GOAL #1   Title  Pt will be provided with and educated on HEP to improve mobility required for ADL completion using RUE.    Time  4    Period  Weeks    Status  On-going    Target Date  06/07/19      OT SHORT TERM GOAL #2   Title  Pt will increase RUE P/ROM to [facility] to improve ability to use RUE as assist during dressing tasks.    Time  4    Period  Weeks    Status  On-going      OT SHORT TERM GOAL #3   Title  Pt will improve RUE strength to 3+/5 to improve ability to reach items at waist to chest height.    Time  4    Period  Weeks    Status  On-going        OT Long Term Goals - 05/13/19 1647      OT LONG TERM GOAL #1   Title  Pt will decrease pain in RUE to 2/10 or less to improve ability to use RUE as dominant during ADL completion.    Time  8    Period  Weeks    Status  On-going      OT LONG TERM GOAL #2   Title  Pt will increase RUE A/ROM to [facility] to improve ability to reach overhead into cabinets when completing housework.    Time  8    Period  Weeks    Status  On-going      OT LONG TERM GOAL #3   Title  Pt will decrease RUE fascial restrictions to minimal amounts or less to improve mobility required for functional reaching tasks.    Time  8    Period  Weeks    Status  On-going      OT LONG TERM GOAL #4   Title  Pt will increase RUE strength to 4/5 or greater to improve ability to perform fishing activities.    Time  8    Period  Weeks    Status  On-going            Plan - 06/05/19 1612    Clinical Impression Statement  A: Continues manual therapy to address fascial restrictions in trapezius and anterior shoulder. Pt reporting he has more movement at RUE than compared to last session "when I was in a lot of pain." Continues to report pain at shoulder and bicept  particularly with elbow extenion. Continues AAROM with use of large therapy ball and PVC pipe. Provided moist heat between exercises for pain. reintroduced pullows for flexion and abduction. Provided handout to add to HEP for AAROM.    Occupational performance deficits (Please refer to evaluation for details):  ADL's;IADL's;Leisure    Body Structure / Function / Physical Skills  ADL;Endurance;UE functional use;Fascial restriction;Pain;ROM;IADL;Strength;Edema    Plan  P: Continue pulleys. Focus on R elbow extension and good form within AAROM. Review HEP for AAROM to promote carry over to home.    OT Home Exercise Plan  AAROM with PVC pipe; hand out provided    Consulted and Agree with Plan of Care  Patient       Patient will benefit from skilled therapeutic intervention in order to improve the following deficits and impairments:   Body Structure / Function / Physical Skills: ADL, Endurance, UE functional use, Fascial restriction, Pain, ROM, IADL, Strength, Edema       Visit Diagnosis: Stiffness of right shoulder, not elsewhere classified  Other symptoms and signs involving the musculoskeletal system  Acute pain of right shoulder    Problem List Patient Active Problem List   Diagnosis Date Noted  . Closed fracture of proximal end of right humerus with routine healing 02/25/2019  . Pre-ulcerative calluses 04/20/2018  . 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 Diabetic foot ulcer associated with type 2 diabetes mellitus (HCC) 07/07/2014  . Tobacco abuse 07/07/2014  . Infestation, maggots 07/07/2014  . Plantar ulcer of right foot (HCC) 05/12/2014  . Pulse weakness   . Peripheral neuropathy   . Cellulitis of right lower extremity 05/09/2014  . Obesity 05/09/2014  . Cellulitis of right lower leg 05/09/2014  . Hyperglycemia 03/21/2013  . IDDM (insulin dependent diabetes mellitus) 03/21/2013  . Noncompliance with medications due to cost issues 03/21/2013    Gabriel Rung, MSOT, OTR/L 06/05/2019,  4:19 PM  [place] [facility] [address] [place], [place], [address] Phone: [phone]   Fax:  [phone]  Name: Mitchell [name] MRN: [id number] Date of Birth: 17-May-1950

## 2019-06-09 ENCOUNTER — Ambulatory Visit (HOSPITAL_COMMUNITY): Payer: Medicare HMO | Admitting: Occupational Therapy

## 2019-06-09 ENCOUNTER — Other Ambulatory Visit: Payer: Self-pay

## 2019-06-09 DIAGNOSIS — M25611 Stiffness of right shoulder, not elsewhere classified: Secondary | ICD-10-CM | POA: Diagnosis not present

## 2019-06-09 DIAGNOSIS — M25511 Pain in right shoulder: Secondary | ICD-10-CM | POA: Diagnosis not present

## 2019-06-09 DIAGNOSIS — R29898 Other symptoms and signs involving the musculoskeletal system: Secondary | ICD-10-CM

## 2019-06-09 NOTE — Therapy (Signed)
[place], [place], [address] Phone: [phone]   Fax:  [phone]  Occupational Therapy Treatment  Patient Details  Name: Mitchell [name] MRN: [id number] Date of Birth: Jun 17, 1950 Referring Provider (OT): Erskine Emery, PA-C   Encounter Date: 06/09/2019  OT End of Session - 06/09/19 1359    Visit Number  9    Number of Visits  17    Date for OT Re-Evaluation  07/07/19    Authorization Type  [employer] Medicare, $20 copay    Authorization Time Period  No visit limit    Progress Note Due on Visit  10    OT Start Time  1257    OT Stop Time  1341    OT Time Calculation (min)  44 min    Activity Tolerance  Patient tolerated treatment well    Behavior During Therapy  [facility] for tasks assessed/performed       Past Medical History:  Diagnosis Date  . Arthritis   . Cellulitis of right lower extremity 05/09/2014  . Diabetes mellitus without complication ([place])   . Diabetic foot ulcer ([place]) 07/07/2014   Status post bedside debridement of multiple right plantar ulcers by podiatrist, Dr. Laurena Spies.  . Hypercholesterolemia   . Maggot infestation    right foot ulcer  . Obesity 05/09/2014  . Tobacco abuse 07/07/2014    Past Surgical History:  Procedure Laterality Date  . APPENDECTOMY    . PERIPHERAL VASCULAR CATHETERIZATION N/A 07/22/2015   Procedure: Abdominal Aortogram w/Lower Extremity;  Surgeon: Elam Dutch, MD;  Location: [place] [facility];  Service: Cardiovascular;  Laterality: N/A;    There were no vitals filed for this visit.                OT Treatments/Exercises (OP) - 06/09/19 1305      Exercises   Exercises  Shoulder      Shoulder Exercises: Supine   Protraction  AAROM;20 reps   10x with PVC and 10x then 2lb dowel   Horizontal ABduction  PROM;5 reps    External Rotation  PROM;5 reps;AAROM;10 reps   PVC   External Rotation Limitations  support at elbow    Internal Rotation  PROM;5 reps;AAROM;10  reps   PVC   Flexion  PROM;5 reps;AAROM;10 reps   PVC   ABduction  PROM;5 reps;AAROM;10 reps   PVC     Shoulder Exercises: Standing   External Rotation  AROM;10 reps;Theraband    Theraband Level (Shoulder External Rotation)  Level 2 (Red)    Internal Rotation  AROM;10 reps;Theraband    Theraband Level (Shoulder Internal Rotation)  Level 2 (Red)    Extension  AROM;10 reps;Theraband    Theraband Level (Shoulder Extension)  Level 2 (Red)    Retraction  AROM;10 reps;Theraband    Theraband Level (Shoulder Retraction)  Level 2 (Red)      Shoulder Exercises: Pulleys   Flexion  Other (comment)   10x   [place] Limitations  elbow support for form    ABduction  Other (comment)   10x   ABduction Limitations  elbow support fpor form      Shoulder Exercises: Therapy Ball   Flexion  10 reps    Flexion Limitations  Support at elbow    Other Therapy Ball Exercises  chest press; 10x      Shoulder Exercises: ROM/Strengthening   Wall Wash  1'; forward flexion    Other ROM/Strengthening Exercises  Wash the table; 1'; flexion  Shoulder Exercises: Stretch   Other Shoulder Stretches  Holding onto door handle with RUE. Turning trunk and head to left. Focused stretch on bicep for increased ROM and pain relief.       Manual Therapy   Manual Therapy  Myofascial release    Manual therapy comments  Manual therapy completed prior to exercises.     Myofascial Release  myofascial release and manual stretching completed to right upper arm, trapezius, and scapularis region to decrease fascial restrictions and increase joint mobility in a pain free zone.              OT Education - 06/09/19 1358    Education Details  Educating on use of wall stretch for external rotation. Pt verbalized understanding    Person(s) Educated  Patient    Methods  Explanation;Demonstration;Handout    Comprehension  Verbalized understanding;Returned demonstration       OT Short Term Goals - 05/13/19 1647       OT SHORT TERM GOAL #1   Title  Pt will be provided with and educated on HEP to improve mobility required for ADL completion using RUE.    Time  4    Period  Weeks    Status  On-going    Target Date  06/07/19      OT SHORT TERM GOAL #2   Title  Pt will increase RUE P/ROM to [facility] to improve ability to use RUE as assist during dressing tasks.    Time  4    Period  Weeks    Status  On-going      OT SHORT TERM GOAL #3   Title  Pt will improve RUE strength to 3+/5 to improve ability to reach items at waist to chest height.    Time  4    Period  Weeks    Status  On-going        OT Long Term Goals - 05/13/19 1647      OT LONG TERM GOAL #1   Title  Pt will decrease pain in RUE to 2/10 or less to improve ability to use RUE as dominant during ADL completion.    Time  8    Period  Weeks    Status  On-going      OT LONG TERM GOAL #2   Title  Pt will increase RUE A/ROM to [facility] to improve ability to reach overhead into cabinets when completing housework.    Time  8    Period  Weeks    Status  On-going      OT LONG TERM GOAL #3   Title  Pt will decrease RUE fascial restrictions to minimal amounts or less to improve mobility required for functional reaching tasks.    Time  8    Period  Weeks    Status  On-going      OT LONG TERM GOAL #4   Title  Pt will increase RUE strength to 4/5 or greater to improve ability to perform fishing activities.    Time  8    Period  Weeks    Status  On-going            Plan - 06/09/19 1400    Clinical Impression Statement  A: Initated manual therapy for fascial release in anterior shoulder. Pt demonstrating increased ROM at shoulder for forward flexion ~110 with elbow in extension. Pt reporting he has been performing HEP at home with broom stick for dowel. Continued strengthing exercises  with AROM and theraband. Pt benefitting from alternating between stretching and strengthening. Providing tactile cues and verbal cues for correct form as well as  scapular and elbow support throughout exercises.    Occupational performance deficits (Please refer to evaluation for details):  ADL's;IADL's;Leisure    Body Structure / Function / Physical Skills  ADL;Endurance;UE functional use;Fascial restriction;Pain;ROM;IADL;Strength;Edema    Plan  P: After myofascial release, let pt perform AAROM in supine. Continue AAROM with dowel and strengthing with theraband. Continue cues for elbow extension.       Patient will benefit from skilled therapeutic intervention in order to improve the following deficits and impairments:   Body Structure / Function / Physical Skills: ADL, Endurance, UE functional use, Fascial restriction, Pain, ROM, IADL, Strength, Edema       Visit Diagnosis: Acute pain of right shoulder  Stiffness of right shoulder, not elsewhere classified  Other symptoms and signs involving the musculoskeletal system    Problem List Patient Active Problem List   Diagnosis Date Noted  . Closed fracture of proximal end of right humerus with routine healing 02/25/2019  . Pre-ulcerative calluses 04/20/2018  . 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 Diabetic foot ulcer associated with type 2 diabetes mellitus ([facility]) 07/07/2014  . Tobacco abuse 07/07/2014  . Infestation, maggots 07/07/2014  . Plantar ulcer of right foot ([facility]CC) 05/12/2014  . Pulse weakness   . Peripheral neuropathy   . Cellulitis of right lower extremity 05/09/2014  . Obesity 05/09/2014  . Cellulitis of right lower leg 05/09/2014  . Hyperglycemia 03/21/2013  . IDDM (insulin dependent diabetes mellitus) 03/21/2013  . Noncompliance with medications due to cost issues 03/21/2013    Gabriel Rung, MSOT, OTR/L 06/09/2019, 2:08 PM  [facility] [address] [place], [place], [address] Phone: [phone]   Fax:  [phone]  Name: Mitchell [name] MRN: [id number] Date of Birth: 1950-12-19

## 2019-06-10 ENCOUNTER — Encounter: Payer: Self-pay | Admitting: Physician Assistant

## 2019-06-10 ENCOUNTER — Ambulatory Visit: Payer: Medicare HMO | Admitting: Physician Assistant

## 2019-06-10 DIAGNOSIS — S42291D Other displaced fracture of upper end of right humerus, subsequent encounter for fracture with routine healing: Secondary | ICD-10-CM | POA: Diagnosis not present

## 2019-06-10 NOTE — Progress Notes (Signed)
Office Visit Note   Patient: Mitchell [name]           Date of Birth: February 02, 1950           MRN: [id number] Visit Date: 06/10/2019              Requested by: Mitchell Blitz, MD [address] [place],  [place] [address] PCP: Mitchell Blitz, MD   Assessment & Plan: Visit Diagnoses:  1. Other closed displaced fracture of proximal end of right humerus with routine healing, subsequent encounter     Plan: We will have him continue to work with physical therapy on range of motion strengthening right shoulder.  See him back in 6 weeks to check his progress.  We did discuss with him getting a home pulley system to work on overhead motion.  He is also given exercises to work on regaining his full supination of the right forearm.  Questions were encouraged and answered.  Follow-Up Instructions: Return in about 2 months (around 08/10/2019).   Orders:  No orders of the defined types were placed in this encounter.  No orders of the defined types were placed in this encounter.     Procedures: No procedures performed   Clinical Data: No additional findings.   Subjective: Chief Complaint  Patient presents with  . Right Shoulder - Follow-up    HPI Mr. Mitchell [name] returns today follow-up of his right shoulder proximal humerus fracture.  He states overall he is doing good.  His pain is getting less he feels like he is gaining strength and range of motion.  He still does not have full overhead activity or ability to reach behind him.  He does report he is back to sleeping in the bed.  He is taking ibuprofen and that helps with the aching pain that he has from time to time.  He is now approximately 16 weeks status post fracture of the proximal right humerus. Review of Systems See HPI otherwise negative  Objective: Vital Signs: There were no vitals taken for this visit.  Physical Exam General: Well-developed well-nourished male no acute distress Ortho Exam Shoulders 5 5 strength with external and internal  rotation against resistance.  Overhead activity full flexion right shoulder actively, to approximately 120 degrees passively and bring him to 150 to 160 degrees.  He has limited external rotation.  He has full range of motion bilateral elbows.  Slight loss of supination of the right forearm compared to left. Specialty Comments:  No specialty comments available.  Imaging: No results found.   PMFS History: Patient Active Problem List   Diagnosis Date Noted  . Closed fracture of proximal end of right humerus with routine healing 02/25/2019  . Pre-ulcerative calluses 04/20/2018  . 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 Diabetic foot ulcer associated with type 2 diabetes mellitus ([place]) 07/07/2014  . Tobacco abuse 07/07/2014  . Infestation, maggots 07/07/2014  . Plantar ulcer of right foot ([place]) 05/12/2014  . Pulse weakness   . Peripheral neuropathy   . Cellulitis of right lower extremity 05/09/2014  . Obesity 05/09/2014  . Cellulitis of right lower leg 05/09/2014  . Hyperglycemia 03/21/2013  . IDDM (insulin dependent diabetes mellitus) 03/21/2013  . Noncompliance with medications due to cost issues 03/21/2013   Past Medical History:  Diagnosis Date  . Arthritis   . Cellulitis of right lower extremity 05/09/2014  . Diabetes mellitus without complication ([place])   . Diabetic foot ulcer ([place]) 07/07/2014   Status post bedside debridement of multiple right plantar  ulcers by podiatrist, Dr. Reynolds [name].  . Hypercholesterolemia   . Maggot infestation    right foot ulcer  . Obesity 05/09/2014  . Tobacco abuse 07/07/2014    History reviewed. No pertinent family history.  Past Surgical History:  Procedure Laterality Date  . APPENDECTOMY    . PERIPHERAL VASCULAR CATHETERIZATION N/A 07/22/2015   Procedure: Abdominal Aortogram w/Lower Extremity;  Surgeon: Mitchell Kerns, MD;  Location: [facility] INVASIVE CV LAB;  Service: Cardiovascular;  Laterality: N/A;   Social History   Occupational History  . Not on file  Tobacco  Use  . Smoking status: Current Every Day Smoker    Packs/day: 1.50    Types: Cigarettes  . Smokeless tobacco: Never Used  Substance and Sexual Activity  . Alcohol use: Yes    Alcohol/week: 0.0 standard drinks    Comment: occ-twice a week beer approx 2  . Drug use: No  . Sexual activity: Not on file

## 2019-06-11 ENCOUNTER — Encounter (HOSPITAL_COMMUNITY): Payer: Self-pay | Admitting: Occupational Therapy

## 2019-06-11 ENCOUNTER — Other Ambulatory Visit: Payer: Self-pay

## 2019-06-11 ENCOUNTER — Ambulatory Visit (HOSPITAL_COMMUNITY): Payer: Medicare HMO | Admitting: Occupational Therapy

## 2019-06-11 DIAGNOSIS — M25511 Pain in right shoulder: Secondary | ICD-10-CM | POA: Diagnosis not present

## 2019-06-11 DIAGNOSIS — M25611 Stiffness of right shoulder, not elsewhere classified: Secondary | ICD-10-CM | POA: Diagnosis not present

## 2019-06-11 DIAGNOSIS — R29898 Other symptoms and signs involving the musculoskeletal system: Secondary | ICD-10-CM | POA: Diagnosis not present

## 2019-06-11 NOTE — Therapy (Addendum)
[place], [place], [address] Phone: [phone]   Fax:  [phone]  Occupational Therapy Treatment  Patient Details  Name: Mitchell [name] MRN: [id number] Date of Birth: [birth year] Referring Provider (OT): Erskine Emery, PA-C  Progress Note Reporting Period 05/08/19 to 06/11/19  See note below for Objective Data and Assessment of Progress/Goals.      Encounter Date: 06/11/2019  OT End of Session - 06/11/19 1718    Visit Number  10    Number of Visits  17    Date for OT Re-Evaluation  07/07/19    Authorization Type  [employer] Medicare, $20 copay    Authorization Time Period  No visit limit    Progress Note Due on Visit  20   OT Start Time  1256    OT Stop Time  1343    OT Time Calculation (min)  47 min    Activity Tolerance  Patient tolerated treatment well    Behavior During Therapy  WFL for tasks assessed/performed       Past Medical History:  Diagnosis Date  . Arthritis   . Cellulitis of right lower extremity 05/09/2014  . Diabetes mellitus without complication ([place])   . Diabetic foot ulcer ([place]) 07/07/2014   Status post bedside debridement of multiple right plantar ulcers by podiatrist, Dr. Laurena Spies.  . Hypercholesterolemia   . Maggot infestation    right foot ulcer  . Obesity 05/09/2014  . Tobacco abuse 07/07/2014    Past Surgical History:  Procedure Laterality Date  . APPENDECTOMY    . PERIPHERAL VASCULAR CATHETERIZATION N/A 07/22/2015   Procedure: Abdominal Aortogram w/Lower Extremity;  Surgeon: Elam Dutch, MD;  Location: [facility] CV LAB;  Service: Cardiovascular;  Laterality: N/A;    There were no vitals filed for this visit.  Subjective Assessment - 06/11/19 1706    Subjective   S: I went to see Dr. Ninfa Linden yesterday and he says he will put in for me to get more therapy. He was impressed with my movement yesterday.    Pertinent History  Pt is a 69 y/o male s/p right proximal humerus fx  sustained on 02/13/19 after falling at a restaurant. Pt reports he has very limited use of his RUE. Pt was referred to occupational therapy for evaluation and treatment by Erskine Emery, PA-C.    Currently in Pain?  Yes    Pain Score  2     Pain Location  Shoulder    Pain Orientation  Right    Pain Descriptors / Indicators  Sore                   OT Treatments/Exercises (OP) - 06/11/19 1707      Exercises   Exercises  Shoulder      Shoulder Exercises: Supine   Horizontal ABduction  PROM;10 reps    External Rotation  PROM;10 reps    Internal Rotation  PROM;10 reps    Flexion  PROM;10 reps    ABduction  PROM;10 reps      Shoulder Exercises: Seated   Extension  AROM;10 reps   PVC   Retraction  AROM;10 reps   PVC   Horizontal ABduction  AAROM;10 reps   PVC   External Rotation  AAROM;10 reps   PVC   Internal Rotation  AAROM;10 reps   PVC   Flexion  AAROM;10 reps   PVC   Abduction  AAROM;10 reps   PVC  Shoulder Exercises: Standing   External Rotation  AROM;10 reps;Theraband    Theraband Level (Shoulder External Rotation)  Level 2 (Red)    Internal Rotation  AROM;10 reps;Theraband    Theraband Level (Shoulder Internal Rotation)  Level 2 (Red)    Extension  AROM;10 reps;Theraband    Theraband Level (Shoulder Extension)  Level 2 (Red)    Retraction  AROM;10 reps;Theraband    Theraband Level (Shoulder Retraction)  Level 2 (Red)      Shoulder Exercises: Pulleys   Flexion  Other (comment)   10x; standing   [place] Limitations  Demonstrating increased control of elbow extension    ABduction  Other (comment)   10x; standing     Shoulder Exercises: ROM/Strengthening   Over Head Lace  1' lacing and 1' taking out      Manual Therapy   Manual Therapy  Myofascial release    Manual therapy comments  Manual therapy completed prior to exercises.     Myofascial Release  myofascial release and manual stretching completed to right upper arm, trapezius, and scapularis  region to decrease fascial restrictions and increase joint mobility in a pain free zone.              OT Education - 06/11/19 1717    Education Details  Educating pt on use of red theraband for strengthing exercises. Provided hand out to add to HEP.    Person(s) Educated  Patient    Methods  Explanation;Demonstration;Handout    Comprehension  Verbalized understanding;Returned demonstration       OT Short Term Goals - 05/13/19 1647      OT SHORT TERM GOAL #1   Title  Pt will be provided with and educated on HEP to improve mobility required for ADL completion using RUE.    Time  4    Period  Weeks    Status  On-going    Target Date  06/07/19      OT SHORT TERM GOAL #2   Title  Pt will increase RUE P/ROM to [facility] to improve ability to use RUE as assist during dressing tasks.    Time  4    Period  Weeks    Status  On-going      OT SHORT TERM GOAL #3   Title  Pt will improve RUE strength to 3+/5 to improve ability to reach items at waist to chest height.    Time  4    Period  Weeks    Status  On-going        OT Long Term Goals - 05/13/19 1647      OT LONG TERM GOAL #1   Title  Pt will decrease pain in RUE to 2/10 or less to improve ability to use RUE as dominant during ADL completion.    Time  8    Period  Weeks    Status  On-going      OT LONG TERM GOAL #2   Title  Pt will increase RUE A/ROM to [facility] to improve ability to reach overhead into cabinets when completing housework.    Time  8    Period  Weeks    Status  On-going      OT LONG TERM GOAL #3   Title  Pt will decrease RUE fascial restrictions to minimal amounts or less to improve mobility required for functional reaching tasks.    Time  8    Period  Weeks    Status  On-going  OT LONG TERM GOAL #4   Title  Pt will increase RUE strength to 4/5 or greater to improve ability to perform fishing activities.    Time  8    Period  Weeks    Status  On-going            Plan - 06/11/19 1719     Clinical Impression Statement  A: Initated with myofasical release and PROM. Pt demonstrating increased both PROM and AROM. Pt performing AAROM with dowel demonsrtrating understanding of HEP. Pt also demonstrating increased controld of movement maintaining elbow extension. Focused on strengthening with use of theraband and over head lacing. Educating pt on use of theraband at door way for anchor and provided hand out and red theraband to add to HEP.  providing verbal and tactile cues throughout for positiong and form.    Body Structure / Function / Physical Skills  ADL;Endurance;UE functional use;Fascial restriction;Pain;ROM;IADL;Strength;Edema    Plan  P: review HEP with theraband. Continue AROM, strengthening, and functional reach.       Patient will benefit from skilled therapeutic intervention in order to improve the following deficits and impairments:   Body Structure / Function / Physical Skills: ADL, Endurance, UE functional use, Fascial restriction, Pain, ROM, IADL, Strength, Edema       Visit Diagnosis: Acute pain of right shoulder  Stiffness of right shoulder, not elsewhere classified  Other symptoms and signs involving the musculoskeletal system    Problem List Patient Active Problem List   Diagnosis Date Noted  . Closed fracture of proximal end of right humerus with routine healing 02/25/2019  . Pre-ulcerative calluses 04/20/2018  . 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 Diabetic foot ulcer associated with type 2 diabetes mellitus ([facility]) 07/07/2014  . Tobacco abuse 07/07/2014  . Infestation, maggots 07/07/2014  . Plantar ulcer of right foot ([facility]) 05/12/2014  . Pulse weakness   . Peripheral neuropathy   . Cellulitis of right lower extremity 05/09/2014  . Obesity 05/09/2014  . Cellulitis of right lower leg 05/09/2014  . Hyperglycemia 03/21/2013  . IDDM (insulin dependent diabetes mellitus) 03/21/2013  . Noncompliance with medications due to cost issues 03/21/2013    Gabriel Rung, MSOT, OTR/L 06/11/2019, 5:27 PM  [facility] [address] [place], [place], [address] Phone: [phone]   Fax:  [phone]  Name: Dayvian Blixt MRN: [id number] Date of Birth: 03-19-1950

## 2019-06-11 NOTE — Patient Instructions (Signed)
Shoulder Exercises with Theraband  10 reps; 1 set; once daily Educating on use of door frame from anchoring.    Standing Shoulder Horizontal Abduction with Resistance Setup Begin in a standing position holding a resistance band in each hand with your arms straight in front of your body and fists facing the floor. Movement Pull your hands apart until they are directly to your sides, then return to the starting position and repeat. Tip Make sure to keep your arms level and think of squeezing your shoulder blades together as you pull the band. Maintain good posture during the exercise and avoid shrugging your shoulders.    Shoulder Internal Rotation with Resistance Setup Begin in a standing upright position with your elbow bent at 90 degrees and a towel roll tucked under your arm, holding a resistance band.  The anchor point should be on the side closest to your bent arm. Movement Slowly rotate your arm inward. Tip Make sure to keep your hips and shoulders facing forward and maintain a gentle chin tuck throughout the exercise.    Standing Row with Resistance Setup Begin standing tall with your arms straight forward holding the ends of a band that is anchored in front of you. Movement Gently squeeze your shoulder blades together and pull the band towards you, keeping your elbows by your sides. Slowly return to the starting position and repeat. Tip Make sure to stay standing tall and do not shrug your shoulders or arch your back.   Scapular Retraction with Resistance Advanced Setup Begin in a standing upright position, holding both ends of a resistance band anchored in front of your body, with your elbows straight. Movement Squeeze your shoulder blades together and downward, pulling against the resistance band. Hold, then relax and repeat. Tip Make sure to keep your back straight during the exercise.

## 2019-06-14 DIAGNOSIS — E7800 Pure hypercholesterolemia, unspecified: Secondary | ICD-10-CM | POA: Diagnosis not present

## 2019-06-14 DIAGNOSIS — E119 Type 2 diabetes mellitus without complications: Secondary | ICD-10-CM | POA: Diagnosis not present

## 2019-06-16 ENCOUNTER — Other Ambulatory Visit: Payer: Self-pay

## 2019-06-16 ENCOUNTER — Ambulatory Visit (HOSPITAL_COMMUNITY): Payer: Medicare HMO | Attending: Podiatry | Admitting: Occupational Therapy

## 2019-06-16 DIAGNOSIS — R29898 Other symptoms and signs involving the musculoskeletal system: Secondary | ICD-10-CM | POA: Insufficient documentation

## 2019-06-16 DIAGNOSIS — M25611 Stiffness of right shoulder, not elsewhere classified: Secondary | ICD-10-CM

## 2019-06-16 DIAGNOSIS — M25511 Pain in right shoulder: Secondary | ICD-10-CM | POA: Diagnosis not present

## 2019-06-16 NOTE — Therapy (Signed)
[facility] [address] [place], [place], [address] Phone: [phone]   Fax:  [phone]  Occupational Therapy Treatment  Patient Details  Name: Mitchell [name] MRN: [id number] Date of Birth: [birth year] Referring Provider (OT): Richardean Canal, PA-C   Encounter Date: 06/16/2019  OT End of Session - 06/16/19 1610    Visit Number  11    Number of Visits  16    Date for OT Re-Evaluation  07/07/19    Authorization Type  [employer] Medicare, $20 copay    Authorization Time Period  No visit limit    Progress Note Due on Visit  10    OT Start Time  1346    OT Stop Time  1430    OT Time Calculation (min)  44 min       Past Medical History:  Diagnosis Date  . Arthritis   . Cellulitis of right lower extremity 05/09/2014  . Diabetes mellitus without complication ([facility])   . Diabetic foot ulcer ([facility]) 07/07/2014   Status post bedside debridement of multiple right plantar ulcers by podiatrist, Dr. Reynolds Bowl.  . Hypercholesterolemia   . Maggot infestation    right foot ulcer  . Obesity 05/09/2014  . Tobacco abuse 07/07/2014    Past Surgical History:  Procedure Laterality Date  . APPENDECTOMY    . PERIPHERAL VASCULAR CATHETERIZATION N/A 07/22/2015   Procedure: Abdominal Aortogram w/Lower Extremity;  Surgeon: Sherren Kerns, MD;  Location: [facility] INVASIVE CV LAB;  Service: Cardiovascular;  Laterality: N/A;    There were no vitals filed for this visit.  Subjective Assessment - 06/16/19 1348    Subjective   S:    Currently in Pain?  Yes    Pain Score  1     Pain Location  Arm    Pain Orientation  Right         [facility] OT Assessment - 06/16/19 1559      Assessment   Medical Diagnosis  right proximal humerus fracture      Precautions   Precautions  None               OT Treatments/Exercises (OP) - 06/16/19 1349      Exercises   Exercises  Shoulder      Shoulder Exercises: Supine   Horizontal ABduction  PROM;10 reps    External Rotation   PROM;10 reps    Internal Rotation  PROM;10 reps    Flexion  PROM;10 reps    ABduction  PROM;10 reps      Shoulder Exercises: Seated   Extension  AROM;10 reps    Retraction  AROM;10 reps    Row  AROM;20 reps    Horizontal ABduction  AAROM;10 reps    External Rotation  AAROM;10 reps    Internal Rotation  AAROM;10 reps    Flexion  AAROM;20 reps   PVC; providing support at traps to provent shoulder hiking   Abduction  AAROM;20 reps   PVC   Other Seated Exercises  Passing cone from RUE to LUE behind back; x10. Reverse x10.      Shoulder Exercises: Standing   External Rotation  Strengthening;10 reps;Theraband    Theraband Level (Shoulder External Rotation)  Level 2 (Red)    Internal Rotation  Strengthening;20 reps;Theraband    Theraband Level (Shoulder Internal Rotation)  Level 2 (Red)    Extension  10 reps;Theraband;Strengthening    Theraband Level (Shoulder Extension)  Level 2 (Red)    Row  Theraband;10 reps;Strengthening  Theraband Level (Shoulder Row)  Level 2 (Red)    Retraction  10 reps;Theraband;Strengthening    Theraband Level (Shoulder Retraction)  Level 2 (Red)    Other Standing Exercises  Bicept curls; x20; red      Shoulder Exercises: Pulleys   Flexion  1 minute   standing   ABduction  1 minute   standing     Functional Reaching Activities   Low Level  6 cones    Mid Level  6 cones      Manual Therapy   Manual Therapy  Myofascial release    Manual therapy comments  Manual therapy completed prior to exercises.     Myofascial Release  myofascial release and manual stretching completed to right upper arm, trapezius, and scapularis region to decrease fascial restrictions and increase joint mobility in a pain free zone.                OT Short Term Goals - 05/13/19 1647      OT SHORT TERM GOAL #1   Title  Pt will be provided with and educated on HEP to improve mobility required for ADL completion using RUE.    Time  4    Period  Weeks    Status   On-going    Target Date  06/07/19      OT SHORT TERM GOAL #2   Title  Pt will increase RUE P/ROM to [facility] to improve ability to use RUE as assist during dressing tasks.    Time  4    Period  Weeks    Status  On-going      OT SHORT TERM GOAL #3   Title  Pt will improve RUE strength to 3+/5 to improve ability to reach items at waist to chest height.    Time  4    Period  Weeks    Status  On-going        OT Long Term Goals - 05/13/19 1647      OT LONG TERM GOAL #1   Title  Pt will decrease pain in RUE to 2/10 or less to improve ability to use RUE as dominant during ADL completion.    Time  8    Period  Weeks    Status  On-going      OT LONG TERM GOAL #2   Title  Pt will increase RUE A/ROM to [facility] to improve ability to reach overhead into cabinets when completing housework.    Time  8    Period  Weeks    Status  On-going      OT LONG TERM GOAL #3   Title  Pt will decrease RUE fascial restrictions to minimal amounts or less to improve mobility required for functional reaching tasks.    Time  8    Period  Weeks    Status  On-going      OT LONG TERM GOAL #4   Title  Pt will increase RUE strength to 4/5 or greater to improve ability to perform fishing activities.    Time  8    Period  Weeks    Status  On-going            Plan - 06/16/19 1611    Clinical Impression Statement  A: Continued myofascial release and AAROM. Pt demonstrating increased PROM and AAROM. Also reporting decreased pain and soreness. Introduced behind back reaches to internal rotation and increasing functional reach. Pt performing functional reach task, pulleys, and  therabands to increase AROM and strength.    Body Structure / Function / Physical Skills  ADL;Endurance;UE functional use;Fascial restriction;Pain;ROM;IADL;Strength;Edema    Plan  P: Continue AROM, strengthing, and functional reach.       Patient will benefit from skilled therapeutic intervention in order to improve the following deficits  and impairments:   Body Structure / Function / Physical Skills: ADL, Endurance, UE functional use, Fascial restriction, Pain, ROM, IADL, Strength, Edema       Visit Diagnosis: Acute pain of right shoulder  Stiffness of right shoulder, not elsewhere classified  Other symptoms and signs involving the musculoskeletal system    Problem List Patient Active Problem List   Diagnosis Date Noted  . Closed fracture of proximal end of right humerus with routine healing 02/25/2019  . Pre-ulcerative calluses 04/20/2018  . 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 Diabetic foot ulcer associated with type 2 diabetes mellitus ([facility]) 07/07/2014  . Tobacco abuse 07/07/2014  . Infestation, maggots 07/07/2014  . Plantar ulcer of right foot ([facility]) 05/12/2014  . Pulse weakness   . Peripheral neuropathy   . Cellulitis of right lower extremity 05/09/2014  . Obesity 05/09/2014  . Cellulitis of right lower leg 05/09/2014  . Hyperglycemia 03/21/2013  . IDDM (insulin dependent diabetes mellitus) 03/21/2013  . Noncompliance with medications due to cost issues 03/21/2013    Gabriel Rung, MSOT, OTR/L 06/16/2019, 4:16 PM  [facility] [address] [place], [place], [address] Phone: [phone]   Fax:  [phone]  Name: Mitchell [name] MRN: [id number] Date of Birth: Jul 01, 1950

## 2019-06-18 ENCOUNTER — Ambulatory Visit (HOSPITAL_COMMUNITY): Payer: Medicare HMO | Admitting: Occupational Therapy

## 2019-06-18 ENCOUNTER — Other Ambulatory Visit: Payer: Self-pay

## 2019-06-18 ENCOUNTER — Encounter (HOSPITAL_COMMUNITY): Payer: Self-pay | Admitting: Occupational Therapy

## 2019-06-18 DIAGNOSIS — M25611 Stiffness of right shoulder, not elsewhere classified: Secondary | ICD-10-CM

## 2019-06-18 DIAGNOSIS — M25511 Pain in right shoulder: Secondary | ICD-10-CM | POA: Diagnosis not present

## 2019-06-18 DIAGNOSIS — R29898 Other symptoms and signs involving the musculoskeletal system: Secondary | ICD-10-CM

## 2019-06-18 NOTE — Therapy (Signed)
[place], [place], [address] Phone: [phone]   Fax:  [phone]  Occupational Therapy Treatment  Patient Details  Name: Mitchell [name] MRN: [id number] Date of Birth: 1950-08-16 Referring Provider (OT): Erskine Emery, PA-C   Encounter Date: 06/18/2019  OT End of Session - 06/18/19 1240    Visit Number  12    Number of Visits  16    Date for OT Re-Evaluation  07/07/19    Authorization Type  [employer] Medicare, $20 copay    Authorization Time Period  No visit limit    Progress Note Due on Visit  10    OT Start Time  1130   pt arrived late   OT Stop Time  1201    OT Time Calculation (min)  31 min    Activity Tolerance  Patient tolerated treatment well    Behavior During Therapy  [facility] for tasks assessed/performed       Past Medical History:  Diagnosis Date  . Arthritis   . Cellulitis of right lower extremity 05/09/2014  . Diabetes mellitus without complication ([place])   . Diabetic foot ulcer ([place]) 07/07/2014   Status post bedside debridement of multiple right plantar ulcers by podiatrist, Dr. Laurena Spies.  . Hypercholesterolemia   . Maggot infestation    right foot ulcer  . Obesity 05/09/2014  . Tobacco abuse 07/07/2014    Past Surgical History:  Procedure Laterality Date  . APPENDECTOMY    . PERIPHERAL VASCULAR CATHETERIZATION N/A 07/22/2015   Procedure: Abdominal Aortogram w/Lower Extremity;  Surgeon: Elam Dutch, MD;  Location: [place] CV LAB;  Service: Cardiovascular;  Laterality: N/A;    There were no vitals filed for this visit.  Subjective Assessment - 06/18/19 1132    Subjective   S: It's been going pretty good at home.    Currently in Pain?  No/denies         [facility] OT Assessment - 06/18/19 1131      Assessment   Medical Diagnosis  right proximal humerus fracture      Precautions   Precautions  None               OT Treatments/Exercises (OP) - 06/18/19 1132      Exercises   Exercises  Shoulder      Shoulder Exercises: Supine   Protraction  PROM;5 reps    Horizontal ABduction  PROM;5 reps    External Rotation  PROM;5 reps    Internal Rotation  PROM;5 reps    Flexion  PROM;5 reps    ABduction  PROM;5 reps      Shoulder Exercises: Seated   Protraction  AAROM;10 reps    Horizontal ABduction  AAROM;10 reps    External Rotation  AAROM;10 reps    Internal Rotation  AAROM;10 reps    Flexion  AAROM;20 reps    Abduction  AAROM;20 reps      Shoulder Exercises: Standing   Protraction  Theraband;10 reps    Theraband Level (Shoulder Protraction)  Level 2 (Red)    External Rotation  Theraband;10 reps    Theraband Level (Shoulder External Rotation)  Level 2 (Red)    Internal Rotation  Theraband;10 reps    Theraband Level (Shoulder Internal Rotation)  Level 2 (Red)    Extension  Theraband;10 reps    Theraband Level (Shoulder Extension)  Level 2 (Red)    Row  Theraband;10 reps    Theraband Level (Shoulder Row)  Level 2 (  Red)    Retraction  Theraband;10 reps    Theraband Level (Shoulder Retraction)  Level 2 (Red)      Shoulder Exercises: Pulleys   Flexion  1 minute   standing   ABduction  1 minute   standing     Shoulder Exercises: Therapy Ball   Other Therapy Ball Exercises  green therapy ball: chest press, flexion, circles each direction, diagonals, 10X      Functional Reaching Activities   Mid Level  pt placed and removed 17 squigz to/from doorway working on flexion               OT Short Term Goals - 05/13/19 1647      OT SHORT TERM GOAL #1   Title  Pt will be provided with and educated on HEP to improve mobility required for ADL completion using RUE.    Time  4    Period  Weeks    Status  On-going    Target Date  06/07/19      OT SHORT TERM GOAL #2   Title  Pt will increase RUE P/ROM to [facility] to improve ability to use RUE as assist during dressing tasks.    Time  4    Period  Weeks    Status  On-going      OT SHORT TERM GOAL #3    Title  Pt will improve RUE strength to 3+/5 to improve ability to reach items at waist to chest height.    Time  4    Period  Weeks    Status  On-going        OT Long Term Goals - 05/13/19 1647      OT LONG TERM GOAL #1   Title  Pt will decrease pain in RUE to 2/10 or less to improve ability to use RUE as dominant during ADL completion.    Time  8    Period  Weeks    Status  On-going      OT LONG TERM GOAL #2   Title  Pt will increase RUE A/ROM to [facility] to improve ability to reach overhead into cabinets when completing housework.    Time  8    Period  Weeks    Status  On-going      OT LONG TERM GOAL #3   Title  Pt will decrease RUE fascial restrictions to minimal amounts or less to improve mobility required for functional reaching tasks.    Time  8    Period  Weeks    Status  On-going      OT LONG TERM GOAL #4   Title  Pt will increase RUE strength to 4/5 or greater to improve ability to perform fishing activities.    Time  8    Period  Weeks    Status  On-going            Plan - 06/18/19 1241    Clinical Impression Statement  A: Pt arrived late for session, therefore no manual techniques completed this session. Pt completed AA/ROM in sitting and theraband tasks in standing. Added functional reaching task with squigz on doorway and therapy ball exercises for shoulder strengthening and stability. Verbal cuing for form and technique.    Body Structure / Function / Physical Skills  ADL;Endurance;UE functional use;Fascial restriction;Pain;ROM;IADL;Strength;Edema    Plan  P: Functional reaching, continue with IR/er behind back    OT Home Exercise Plan  AAROM with PVC pipe; hand out  provided    Consulted and Agree with Plan of Care  Patient       Patient will benefit from skilled therapeutic intervention in order to improve the following deficits and impairments:   Body Structure / Function / Physical Skills: ADL, Endurance, UE functional use, Fascial restriction, Pain,  ROM, IADL, Strength, Edema       Visit Diagnosis: Acute pain of right shoulder  Stiffness of right shoulder, not elsewhere classified  Other symptoms and signs involving the musculoskeletal system    Problem List Patient Active Problem List   Diagnosis Date Noted  . Closed fracture of proximal end of right humerus with routine healing 02/25/2019  . Pre-ulcerative calluses 04/20/2018  . 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 Diabetic foot ulcer associated with type 2 diabetes mellitus (HCC) 07/07/2014  . Tobacco abuse 07/07/2014  . Infestation, maggots 07/07/2014  . Plantar ulcer of right foot ([facility]) 05/12/2014  . Pulse weakness   . Peripheral neuropathy   . Cellulitis of right lower extremity 05/09/2014  . Obesity 05/09/2014  . Cellulitis of right lower leg 05/09/2014  . Hyperglycemia 03/21/2013  . IDDM (insulin dependent diabetes mellitus) 03/21/2013  . Noncompliance with medications due to cost issues 03/21/2013   Ezra Sites, OTR/L  [phone] 06/18/2019, 12:44 PM  [facility] [address] [place], [place], [address] Phone: [phone]   Fax:  [phone]  Name: Mitchell [name] MRN: [id number] Date of Birth: 09/03/1950

## 2019-06-23 ENCOUNTER — Ambulatory Visit (HOSPITAL_COMMUNITY): Payer: Medicare HMO | Admitting: Occupational Therapy

## 2019-06-23 ENCOUNTER — Encounter (HOSPITAL_COMMUNITY): Payer: Self-pay | Admitting: Occupational Therapy

## 2019-06-23 ENCOUNTER — Other Ambulatory Visit: Payer: Self-pay

## 2019-06-23 DIAGNOSIS — R29898 Other symptoms and signs involving the musculoskeletal system: Secondary | ICD-10-CM

## 2019-06-23 DIAGNOSIS — M25611 Stiffness of right shoulder, not elsewhere classified: Secondary | ICD-10-CM

## 2019-06-23 DIAGNOSIS — M25511 Pain in right shoulder: Secondary | ICD-10-CM

## 2019-06-23 NOTE — Therapy (Signed)
[name] [address] [place], [place], [address] Phone: [phone]   Fax:  [phone]  Occupational Therapy Treatment  Patient Details  Name: Mitchell [name] MRN: [id number] Date of Birth: [birth year] Referring Provider (OT): Richardean Canal, PA-C   Encounter Date: 06/23/2019  OT End of Session - 06/23/19 1311    Visit Number  13    Number of Visits  16    Date for OT Re-Evaluation  07/07/19    Authorization Type  [employer] Medicare, $20 copay    Authorization Time Period  No visit limit    Progress Note Due on Visit  10    OT Start Time  1300    OT Stop Time  1344    OT Time Calculation (min)  44 min    Activity Tolerance  Patient tolerated treatment well    Behavior During Therapy  [facility] for tasks assessed/performed       Past Medical History:  Diagnosis Date  . Arthritis   . Cellulitis of right lower extremity 05/09/2014  . Diabetes mellitus without complication ([facility])   . Diabetic foot ulcer ([facility]) 07/07/2014   Status post bedside debridement of multiple right plantar ulcers by podiatrist, Dr. Reynolds Bowl.  . Hypercholesterolemia   . Maggot infestation    right foot ulcer  . Obesity 05/09/2014  . Tobacco abuse 07/07/2014    Past Surgical History:  Procedure Laterality Date  . APPENDECTOMY    . PERIPHERAL VASCULAR CATHETERIZATION N/A 07/22/2015   Procedure: Abdominal Aortogram w/Lower Extremity;  Surgeon: Mitchell Kerns, MD;  Location: [facility] INVASIVE CV LAB;  Service: Cardiovascular;  Laterality: N/A;    There were no vitals filed for this visit.  Subjective Assessment - 06/23/19 1310    Subjective   S: "I am as stiff as a board"    Pertinent History  Pt is a 69 y/o male s/p right proximal humerus fx sustained on 02/13/19 after falling at a restaurant. Pt reports he has very limited use of his RUE. Pt was referred to occupational therapy for evaluation and treatment by Richardean Canal, PA-C.    Currently in Pain?  Yes    Pain Score  5     Pain  Location  Shoulder    Pain Orientation  Right    Pain Descriptors / Indicators  Sore         [facility] OT Assessment - 06/23/19 1410      Assessment   Medical Diagnosis  right proximal humerus fracture    Referring Provider (OT)  Richardean Canal, PA-C               OT Treatments/Exercises (OP) - 06/23/19 1311      Exercises   Exercises  Shoulder      Shoulder Exercises: Supine   Horizontal ABduction  PROM;5 reps;AAROM;10 reps    External Rotation  PROM;5 reps;AAROM;10 reps    Internal Rotation  PROM;5 reps;AAROM;10 reps    Flexion  PROM;5 reps;AAROM;10 reps    ABduction  PROM;5 reps;AAROM;10 reps      Shoulder Exercises: Seated   Elevation  AROM;10 reps    Retraction  AROM;10 reps    Other Seated Exercises  Shoulder rolls forwards and backwards. 10x each      Shoulder Exercises: Pulleys   Flexion  1 minute   Standing; x2   ABduction  1 minute   standing; x2     Shoulder Exercises: Therapy [place] [employer]   Other Therapy [place] Exercises  green therapy ball: chest press, flexion, circles each direction, diagonals, 10X      Shoulder Exercises: ROM/Strengthening   Other ROM/Strengthening Exercises  Passing short PVC pipe behind back; RUE >LUE 10x; LUE>RUE 10x      Functional Reaching Activities   High Level  Taking clothes pins off pole for 1 minute; then placing back on for 1 min.  Performed in standing      Modalities   Modalities  Moist Heat      Moist Heat Therapy   Number Minutes Moist Heat  5 Minutes    Moist Heat Location  Shoulder      Manual Therapy   Manual Therapy  Myofascial release    Manual therapy comments  Manual therapy completed prior to exercises.     Myofascial Release  myofascial release and manual stretching completed to right upper arm, trapezius, and scapularis region to decrease fascial restrictions and increase joint mobility in a pain free zone.                OT Short Term Goals - 05/13/19 1647      OT SHORT TERM GOAL #1   Title   Pt will be provided with and educated on HEP to improve mobility required for ADL completion using RUE.    Time  4    Period  Weeks    Status  On-going    Target Date  06/07/19      OT SHORT TERM GOAL #2   Title  Pt will increase RUE P/ROM to [facility] to improve ability to use RUE as assist during dressing tasks.    Time  4    Period  Weeks    Status  On-going      OT SHORT TERM GOAL #3   Title  Pt will improve RUE strength to 3+/5 to improve ability to reach items at waist to chest height.    Time  4    Period  Weeks    Status  On-going        OT Long Term Goals - 05/13/19 1647      OT LONG TERM GOAL #1   Title  Pt will decrease pain in RUE to 2/10 or less to improve ability to use RUE as dominant during ADL completion.    Time  8    Period  Weeks    Status  On-going      OT LONG TERM GOAL #2   Title  Pt will increase RUE A/ROM to [facility] to improve ability to reach overhead into cabinets when completing housework.    Time  8    Period  Weeks    Status  On-going      OT LONG TERM GOAL #3   Title  Pt will decrease RUE fascial restrictions to minimal amounts or less to improve mobility required for functional reaching tasks.    Time  8    Period  Weeks    Status  On-going      OT LONG TERM GOAL #4   Title  Pt will increase RUE strength to 4/5 or greater to improve ability to perform fishing activities.    Time  8    Period  Weeks    Status  On-going            Plan - 06/23/19 1452    Clinical Impression Statement  A: Upon arrival, pt reporting he is very stiff as he did not do his exercises  over the weekend while he was out of town. Providing myofascial release, PROM, and AAROM with dowel rode in supine. Pt performing AAROM with therapy ball and pulleys. After stretching and AAROM, pt able to engage in functional reach task to take clothe pins off pole and then place back on.    Body Structure / Function / Physical Skills  ADL;Endurance;UE functional use;Fascial  restriction;Pain;ROM;IADL;Strength;Edema    Plan  P: Continue functional reach, strengthing, and IR/ER behind back.    OT Home Exercise Plan  --    Consulted and Agree with Plan of Care  Patient       Patient will benefit from skilled therapeutic intervention in order to improve the following deficits and impairments:   Body Structure / Function / Physical Skills: ADL, Endurance, UE functional use, Fascial restriction, Pain, ROM, IADL, Strength, Edema       Visit Diagnosis: Acute pain of right shoulder  Stiffness of right shoulder, not elsewhere classified  Other symptoms and signs involving the musculoskeletal system    Problem List Patient Active Problem List   Diagnosis Date Noted  . Closed fracture of proximal end of right humerus with routine healing 02/25/2019  . Pre-ulcerative calluses 04/20/2018  . 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 Diabetic foot ulcer associated with type 2 diabetes mellitus ([facility]) 07/07/2014  . Tobacco abuse 07/07/2014  . Infestation, maggots 07/07/2014  . Plantar ulcer of right foot ([facility]) 05/12/2014  . Pulse weakness   . Peripheral neuropathy   . Cellulitis of right lower extremity 05/09/2014  . Obesity 05/09/2014  . Cellulitis of right lower leg 05/09/2014  . Hyperglycemia 03/21/2013  . IDDM (insulin dependent diabetes mellitus) 03/21/2013  . Noncompliance with medications due to cost issues 03/21/2013    Gabriel Rung, MSOT,OTR/L 06/23/2019, 3:01 PM  [facility] [address] [place], [place], [address] Phone: [phone]   Fax:  [phone]  Name: Maurice Fotheringham MRN: [id number] Date of Birth: 09/18/1950

## 2019-06-25 ENCOUNTER — Encounter (HOSPITAL_COMMUNITY): Payer: Self-pay | Admitting: Occupational Therapy

## 2019-06-25 ENCOUNTER — Other Ambulatory Visit: Payer: Self-pay

## 2019-06-25 ENCOUNTER — Ambulatory Visit (HOSPITAL_COMMUNITY): Payer: Medicare HMO | Admitting: Occupational Therapy

## 2019-06-25 DIAGNOSIS — M25511 Pain in right shoulder: Secondary | ICD-10-CM

## 2019-06-25 DIAGNOSIS — M25611 Stiffness of right shoulder, not elsewhere classified: Secondary | ICD-10-CM

## 2019-06-25 DIAGNOSIS — R29898 Other symptoms and signs involving the musculoskeletal system: Secondary | ICD-10-CM | POA: Diagnosis not present

## 2019-06-25 NOTE — Therapy (Addendum)
[facility] [address] [place], [place], [address] Phone: [phone]   Fax:  [phone]  Occupational Therapy Treatment  Patient Details  Name: Mitchell [name] MRN: [id number] Date of Birth: Sep 14, 1950 Referring Provider (OT): Richardean Canal, PA-C   Encounter Date: 06/25/2019   OT End of Session - 06/25/19 1503    Visit Number 14    Number of Visits 16    Date for OT Re-Evaluation 07/07/19    Authorization Type [employer] Medicare, $20 copay    Authorization Time Period No visit limit    Progress Note Due on Visit 10    OT Start Time 1300    OT Stop Time 1345    OT Time Calculation (min) 45 min    Activity Tolerance Patient tolerated treatment well    Behavior During Therapy [facility] for tasks assessed/performed           Past Medical History:  Diagnosis Date  . Arthritis   . Cellulitis of right lower extremity 05/09/2014  . Diabetes mellitus without complication ([facility])   . Diabetic foot ulcer ([facility]) 07/07/2014   Status post bedside debridement of multiple right plantar ulcers by podiatrist, Dr. Reynolds Bowl.  . Hypercholesterolemia   . Maggot infestation    right foot ulcer  . Obesity 05/09/2014  . Tobacco abuse 07/07/2014    Past Surgical History:  Procedure Laterality Date  . APPENDECTOMY    . PERIPHERAL VASCULAR CATHETERIZATION N/A 07/22/2015   Procedure: Abdominal Aortogram w/Lower Extremity;  Surgeon: Sherren Kerns, MD;  Location: [facility] INVASIVE CV LAB;  Service: Cardiovascular;  Laterality: N/A;    There were no vitals filed for this visit.   Subjective Assessment - 06/25/19 1300    Subjective  S: "It feels like an arm."    Pertinent History Pt is a 69 y/o male s/p right proximal humerus fx sustained on 02/13/19 after falling at a restaurant. Pt reports he has very limited use of his RUE. Pt was referred to occupational therapy for evaluation and treatment by Richardean Canal, PA-C.    Currently in Pain? Yes    Pain Score 3     Pain Location  Shoulder    Pain Orientation Right    Pain Descriptors / Indicators Sore              [facility] OT Assessment - 06/25/19 0001      Assessment   Medical Diagnosis right proximal humerus fracture    Referring Provider (OT) Richardean Canal, PA-C      Precautions   Precautions None                    OT Treatments/Exercises (OP) - 06/25/19 1307      Exercises   Exercises Shoulder      Shoulder Exercises: Supine   Protraction AAROM;10 reps    Protraction Weight (lbs) 2    Horizontal ABduction AAROM;10 reps;Weights    Horizontal ABduction Weight (lbs) 2    External Rotation AAROM;10 reps    External Rotation Weight (lbs) 2    Internal Rotation AAROM;10 reps    Internal Rotation Weight (lbs) 2    Flexion AAROM;10 reps    Shoulder Flexion Weight (lbs) 2    ABduction AAROM;10 reps    Shoulder ABduction Weight (lbs) 2      Shoulder Exercises: Seated   Elevation --    Retraction --    Flexion 10 reps;Weights;Strengthening    Other Seated Exercises Shoulder rolls forwards  and backwards. 10x each    Other Seated Exercises bicept curls 2# 10x; 4# 10x      Shoulder Exercises: Standing   Internal Rotation Theraband;10 reps    Theraband Level (Shoulder Internal Rotation) Level 2 (Red)    Extension Theraband;10 reps    Theraband Level (Shoulder Extension) Level 2 (Red)    Row Theraband;10 reps    Theraband Level (Shoulder Row) Level 2 (Red)      Shoulder Exercises: Pulleys   Flexion 1 minute   Standing   ABduction 1 minute   standing     Shoulder Exercises: Therapy Ball   Other Therapy Ball Exercises green therapy ball: chest press, flexion, circles each direction, 10X      Shoulder Exercises: ROM/Strengthening   Wall Wash 10x forward flexion   Therapist providing tactile cues and support at elbow   Proximal Shoulder Strengthening, Supine 30 sec in each position    Other ROM/Strengthening Exercises Passing short PVC pipe behind back; RUE >LUE 10x; LUE>RUE 10x     Other ROM/Strengthening Exercises Wall push up with RUE. 10x      Functional Reaching Activities   Mid Level into cabinet and back down. 10x forward flexion. abduction 10x      Modalities   Modalities --      Moist Heat Therapy   Moist Heat Location --      Manual Therapy   Manual Therapy Myofascial release    Manual therapy comments Manual therapy completed prior to exercises.     Myofascial Release myofascial release and manual stretching completed to right upper arm, trapezius, and scapularis region to decrease fascial restrictions and increase joint mobility in a pain free zone.                     OT Short Term Goals - 05/13/19 1647      OT SHORT TERM GOAL #1   Title Pt will be provided with and educated on HEP to improve mobility required for ADL completion using RUE.    Time 4    Period Weeks    Status On-going    Target Date 06/07/19      OT SHORT TERM GOAL #2   Title Pt will increase RUE P/ROM to [facility] to improve ability to use RUE as assist during dressing tasks.    Time 4    Period Weeks    Status On-going      OT SHORT TERM GOAL #3   Title Pt will improve RUE strength to 3+/5 to improve ability to reach items at waist to chest height.    Time 4    Period Weeks    Status On-going             OT Long Term Goals - 05/13/19 1647      OT LONG TERM GOAL #1   Title Pt will decrease pain in RUE to 2/10 or less to improve ability to use RUE as dominant during ADL completion.    Time 8    Period Weeks    Status On-going      OT LONG TERM GOAL #2   Title Pt will increase RUE A/ROM to [facility] to improve ability to reach overhead into cabinets when completing housework.    Time 8    Period Weeks    Status On-going      OT LONG TERM GOAL #3   Title Pt will decrease RUE fascial restrictions to minimal amounts or less  to improve mobility required for functional reaching tasks.    Time 8    Period Weeks    Status On-going      OT LONG TERM GOAL #4    Title Pt will increase RUE strength to 4/5 or greater to improve ability to perform fishing activities.    Time 8    Period Weeks    Status On-going                 Plan - 06/25/19 1504    Clinical Impression Statement A: Focused session on strengthing and AAROM. Starting with myofasical release and then transitioning to [facility] in supine with 2# dowel and proximal strengthing. Continuing AAROM, strenthening, and functional reach.    Occupational performance deficits (Please refer to evaluation for details): ADL's;IADL's;Leisure    Body Structure / Function / Physical Skills ADL;Endurance;UE functional use;Fascial restriction;Pain;ROM;IADL;Strength;Edema    Plan P: Continue strengthing, functional reach, and IR/ER . Mini reassess next session as 15/16 session?          Patient will benefit from skilled therapeutic intervention in order to improve the following deficits and impairments:   Body Structure / Function / Physical Skills: ADL, Endurance, UE functional use, Fascial restriction, Pain, ROM, IADL, Strength, Edema       Visit Diagnosis: Acute pain of right shoulder  Stiffness of right shoulder, not elsewhere classified  Other symptoms and signs involving the musculoskeletal system    Problem List Patient Active Problem List   Diagnosis Date Noted  . Closed fracture of proximal end of right humerus with routine healing 02/25/2019  . Pre-ulcerative calluses 04/20/2018  . 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 Diabetic foot ulcer associated with type 2 diabetes mellitus ([facility]) 07/07/2014  . Tobacco abuse 07/07/2014  . Infestation, maggots 07/07/2014  . Plantar ulcer of right foot ([facility]CC) 05/12/2014  . Pulse weakness   . Peripheral neuropathy   . Cellulitis of right lower extremity 05/09/2014  . Obesity 05/09/2014  . Cellulitis of right lower leg 05/09/2014  . Hyperglycemia 03/21/2013  . IDDM (insulin dependent diabetes mellitus) 03/21/2013  . Noncompliance with medications due to  cost issues 03/21/2013    Gabriel Rung, MSOT, OTR/L 06/25/2019, 3:08 PM  [facility] [address] [place], [place], [address] Phone: [phone]   Fax:  [phone]  Name: Lyman Balingit MRN: [id number] Date of Birth: [birth year]

## 2019-06-30 ENCOUNTER — Other Ambulatory Visit: Payer: Self-pay

## 2019-06-30 ENCOUNTER — Ambulatory Visit (HOSPITAL_COMMUNITY): Payer: Medicare HMO | Admitting: Occupational Therapy

## 2019-06-30 DIAGNOSIS — M25511 Pain in right shoulder: Secondary | ICD-10-CM | POA: Diagnosis not present

## 2019-06-30 DIAGNOSIS — M25611 Stiffness of right shoulder, not elsewhere classified: Secondary | ICD-10-CM | POA: Diagnosis not present

## 2019-06-30 DIAGNOSIS — R29898 Other symptoms and signs involving the musculoskeletal system: Secondary | ICD-10-CM | POA: Diagnosis not present

## 2019-06-30 NOTE — Therapy (Signed)
[facility] [address] [place], [place], [address] Phone: [phone]   Fax:  [phone]  Occupational Therapy Treatment  Patient Details  Name: Mitchell [name] MRN: [id number] Date of Birth: 12-28-1950 Referring Provider (OT): Richardean Canal, PA-C   Encounter Date: 06/30/2019   OT End of Session - 06/30/19 1355    Visit Number 15    Number of Visits 16    Date for OT Re-Evaluation 07/07/19    Authorization Type [employer] Medicare, $20 copay    Authorization Time Period No visit limit    Progress Note Due on Visit 10    OT Start Time 1303    OT Stop Time 1344    OT Time Calculation (min) 41 min    Activity Tolerance Patient tolerated treatment well    Behavior During Therapy [facility] for tasks assessed/performed           Past Medical History:  Diagnosis Date  . Arthritis   . Cellulitis of right lower extremity 05/09/2014  . Diabetes mellitus without complication (HCC)   . Diabetic foot ulcer (HCC) 07/07/2014   Status post bedside debridement of multiple right plantar ulcers by podiatrist, Dr. Reynolds Bowl.  . Hypercholesterolemia   . Maggot infestation    right foot ulcer  . Obesity 05/09/2014  . Tobacco abuse 07/07/2014    Past Surgical History:  Procedure Laterality Date  . APPENDECTOMY    . PERIPHERAL VASCULAR CATHETERIZATION N/A 07/22/2015   Procedure: Abdominal Aortogram w/Lower Extremity;  Surgeon: Sherren Kerns, MD;  Location: [facility] INVASIVE CV LAB;  Service: Cardiovascular;  Laterality: N/A;    There were no vitals filed for this visit.   Subjective Assessment - 06/30/19 1306    Subjective  S: Im not getting full range on all my stuff."    Pertinent History Pt is a 69 y/o male s/p right proximal humerus fx sustained on 02/13/19 after falling at a restaurant. Pt reports he has very limited use of his RUE. Pt was referred to occupational therapy for evaluation and treatment by Richardean Canal, PA-C.    Currently in Pain? Yes    Pain Score 6      Pain Location Shoulder    Pain Orientation Right    Pain Descriptors / Indicators Sore                        OT Treatments/Exercises (OP) - 06/30/19 1307      Exercises   Exercises Shoulder      Shoulder Exercises: Seated   Extension AAROM;10 reps    Row AAROM;20 reps   dowel   External Rotation AAROM;10 reps   dowel   Internal Rotation AAROM;10 reps   dowel   Flexion AROM;10 reps    Abduction AAROM;10 reps   dowel     Shoulder Exercises: ROM/Strengthening   UBE (Upper Arm Bike) 2 minutes. forward. pace 4-5    Wall Wash 20x forward flexion    Other ROM/Strengthening Exercises Passing short PVC pipe behind back; RUE >LUE 10x; LUE>RUE 10x      Shoulder Exercises: Stretch   Other Shoulder Stretches Holding onto door handle with RUE. Turning trunk and head to left. Focused stretch on bicep for increased ROM and pain relief.       Functional Reaching Activities   Mid Level 10 cones. forward flexion. abduction.  OT Short Term Goals - 05/13/19 1647      OT SHORT TERM GOAL #1   Title Pt will be provided with and educated on HEP to improve mobility required for ADL completion using RUE.    Time 4    Period Weeks    Status On-going    Target Date 06/07/19      OT SHORT TERM GOAL #2   Title Pt will increase RUE P/ROM to [facility] to improve ability to use RUE as assist during dressing tasks.    Time 4    Period Weeks    Status On-going      OT SHORT TERM GOAL #3   Title Pt will improve RUE strength to 3+/5 to improve ability to reach items at waist to chest height.    Time 4    Period Weeks    Status On-going             OT Long Term Goals - 05/13/19 1647      OT LONG TERM GOAL #1   Title Pt will decrease pain in RUE to 2/10 or less to improve ability to use RUE as dominant during ADL completion.    Time 8    Period Weeks    Status On-going      OT LONG TERM GOAL #2   Title Pt will increase RUE A/ROM to [facility] to improve  ability to reach overhead into cabinets when completing housework.    Time 8    Period Weeks    Status On-going      OT LONG TERM GOAL #3   Title Pt will decrease RUE fascial restrictions to minimal amounts or less to improve mobility required for functional reaching tasks.    Time 8    Period Weeks    Status On-going      OT LONG TERM GOAL #4   Title Pt will increase RUE strength to 4/5 or greater to improve ability to perform fishing activities.    Time 8    Period Weeks    Status On-going                 Plan - 06/30/19 1357    Clinical Impression Statement A: Focused session on strengthing, functional reach, and AAROM. Started session with myofascial release in sitting. Pt demonstrating increased tone at anterior portion of shoulder into pecs region and bicep. AAROM with dowel exercises and wall washes. Introduced arm bike for strengthening; pt reporting bike feeling good for RUE. Providing verbal and tactile cues thorughout for form and technique.    Body Structure / Function / Physical Skills ADL;Endurance;UE functional use;Fascial restriction;Pain;ROM;IADL;Strength;Edema    OT Treatment/Interventions Self-care/ADL training;Ultrasound;Patient/family education;Passive range of motion;Cryotherapy;Electrical Stimulation;Moist Heat;Therapeutic exercise;Manual Therapy;Therapeutic activities    Plan P: Will continue stregthening, ROM, functional reach, and IR/ER. Attempt high reach for forward flexion.           Patient will benefit from skilled therapeutic intervention in order to improve the following deficits and impairments:   Body Structure / Function / Physical Skills: ADL, Endurance, UE functional use, Fascial restriction, Pain, ROM, IADL, Strength, Edema       Visit Diagnosis: Acute pain of right shoulder  Stiffness of right shoulder, not elsewhere classified  Other symptoms and signs involving the musculoskeletal system    Problem List Patient Active  Problem List   Diagnosis Date Noted  . Closed fracture of proximal end of right humerus with routine healing 02/25/2019  . Pre-ulcerative  calluses 04/20/2018  . 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 Diabetic foot ulcer associated with type 2 diabetes mellitus ([place]) 07/07/2014  . Tobacco abuse 07/07/2014  . Infestation, maggots 07/07/2014  . Plantar ulcer of right foot ([place]) 05/12/2014  . Pulse weakness   . Peripheral neuropathy   . Cellulitis of right lower extremity 05/09/2014  . Obesity 05/09/2014  . Cellulitis of right lower leg 05/09/2014  . Hyperglycemia 03/21/2013  . IDDM (insulin dependent diabetes mellitus) 03/21/2013  . Noncompliance with medications due to cost issues 03/21/2013    Neal Dy, MSOT, OTR/L 06/30/2019, 2:06 PM  [place] [address] [place], [place], [address] Phone: [phone]   Fax:  [phone]  Name: Mitchell [name] MRN: [id number] Date of Birth: Jul 20, 1950

## 2019-07-02 ENCOUNTER — Other Ambulatory Visit: Payer: Self-pay

## 2019-07-02 ENCOUNTER — Encounter (HOSPITAL_COMMUNITY): Payer: Self-pay | Admitting: Occupational Therapy

## 2019-07-02 ENCOUNTER — Ambulatory Visit (HOSPITAL_COMMUNITY): Payer: Medicare HMO | Admitting: Occupational Therapy

## 2019-07-02 DIAGNOSIS — R29898 Other symptoms and signs involving the musculoskeletal system: Secondary | ICD-10-CM

## 2019-07-02 DIAGNOSIS — M25511 Pain in right shoulder: Secondary | ICD-10-CM

## 2019-07-02 DIAGNOSIS — M25611 Stiffness of right shoulder, not elsewhere classified: Secondary | ICD-10-CM | POA: Diagnosis not present

## 2019-07-02 NOTE — Addendum Note (Signed)
Addended by: Shirlean Mylar on: 07/02/2019 05:45 PM   Modules accepted: Orders

## 2019-07-02 NOTE — Therapy (Signed)
[facility] [address] [place], [place], [address] Phone: [phone]   Fax:  [phone]  Occupational Therapy Treatment  Patient Details  Name: Mitchell [name] MRN: [id number] Date of Birth: 27-Oct-1950 Referring Provider (OT): Richardean Canal, PA-C   Encounter Date: 07/02/2019   OT End of Session - 07/02/19 1410    Visit Number 16   Performed Mini-reassessment   Number of Visits 24    Date for OT Re-Evaluation 07/30/19    Authorization Type [employer] Medicare, $20 copay    Authorization Time Period No visit limit    Progress Note Due on Visit 20    OT Start Time 1302    OT Stop Time 1345    OT Time Calculation (min) 43 min    Activity Tolerance Patient tolerated treatment well    Behavior During Therapy [facility] for tasks assessed/performed           Past Medical History:  Diagnosis Date  . Arthritis   . Cellulitis of right lower extremity 05/09/2014  . Diabetes mellitus without complication (HCC)   . Diabetic foot ulcer (HCC) 07/07/2014   Status post bedside debridement of multiple right plantar ulcers by podiatrist, Dr. Reynolds Bowl.  . Hypercholesterolemia   . Maggot infestation    right foot ulcer  . Obesity 05/09/2014  . Tobacco abuse 07/07/2014    Past Surgical History:  Procedure Laterality Date  . APPENDECTOMY    . PERIPHERAL VASCULAR CATHETERIZATION N/A 07/22/2015   Procedure: Abdominal Aortogram w/Lower Extremity;  Surgeon: Sherren Kerns, MD;  Location: [facility] INVASIVE CV LAB;  Service: Cardiovascular;  Laterality: N/A;    There were no vitals filed for this visit.       [facility] OT Assessment - 07/02/19 1304      Assessment   Medical Diagnosis right proximal humerus fracture    Referring Provider (OT) Richardean Canal, PA-C      Precautions   Precautions None      ROM / Strength   AROM / PROM / Strength AROM;PROM;Strength      AROM   Overall AROM Comments Assessed seated    AROM Assessment Site Shoulder    Right/Left Shoulder  Right    Right Shoulder Flexion 106 Degrees   60 previous   Right Shoulder ABduction 86 Degrees   51 previous   Right Shoulder Internal Rotation 90 Degrees    Right Shoulder External Rotation 36 Degrees   27 previous     PROM   Overall PROM Comments Assessed supine, er/IR adducted    PROM Assessment Site Shoulder    Right/Left Shoulder Right    Right Shoulder Flexion 124 Degrees   96 previous   Right Shoulder ABduction 90 Degrees   previous 74   Right Shoulder Internal Rotation 90 Degrees    Right Shoulder External Rotation 44 Degrees   14 previous     Strength   Overall Strength Comments Assessed seated, er/IR adducted    Strength Assessment Site Shoulder    Right/Left Shoulder Right    Right Shoulder Flexion 3+/5    Right Shoulder ABduction 3+/5    Right Shoulder Internal Rotation 4/5    Right Shoulder External Rotation 4-/5                    OT Treatments/Exercises (OP) - 07/02/19 1328      Exercises   Exercises Shoulder      Shoulder Exercises: Pulleys   Flexion 1 minute  Flexion Limitations Tactile cues at shoulder to decreaser compensatory hiking     ABduction 1 minute    ABduction Limitations Tactile cues at shoulder to decreaser compensatory hiking       Shoulder Exercises: ROM/Strengthening   UBE (Upper Arm Bike) 2 minutes. forward. pace 5-6    Wall Wash 10x forward flexion; 10x abduction      Functional Reaching Activities   Mid Level 10 cones. abduction.    High Level 10 cones. forward flexion.      Manual Therapy   Manual Therapy Myofascial release    Manual therapy comments Manual therapy completed prior to exercises.     Myofascial Release myofascial release and manual stretching completed to right upper arm, trapezius, and scapularis region to decrease fascial restrictions and increase joint mobility in a pain free zone.                     OT Short Term Goals - 05/13/19 1647      OT SHORT TERM GOAL #1   Title Pt will be  provided with and educated on HEP to improve mobility required for ADL completion using RUE.    Time 4    Period Weeks    Status On-going    Target Date 06/07/19      OT SHORT TERM GOAL #2   Title Pt will increase RUE P/ROM to [facility] to improve ability to use RUE as assist during dressing tasks.    Time 4    Period Weeks    Status On-going      OT SHORT TERM GOAL #3   Title Pt will improve RUE strength to 3+/5 to improve ability to reach items at waist to chest height.    Time 4    Period Weeks    Status On-going             OT Long Term Goals - 05/13/19 1647      OT LONG TERM GOAL #1   Title Pt will decrease pain in RUE to 2/10 or less to improve ability to use RUE as dominant during ADL completion.    Time 8    Period Weeks    Status On-going      OT LONG TERM GOAL #2   Title Pt will increase RUE A/ROM to [facility] to improve ability to reach overhead into cabinets when completing housework.    Time 8    Period Weeks    Status On-going      OT LONG TERM GOAL #3   Title Pt will decrease RUE fascial restrictions to minimal amounts or less to improve mobility required for functional reaching tasks.    Time 8    Period Weeks    Status On-going      OT LONG TERM GOAL #4   Title Pt will increase RUE strength to 4/5 or greater to improve ability to perform fishing activities.    Time 8    Period Weeks    Status On-going                 Plan - 07/02/19 1459    Clinical Impression Statement A: Mini Reassessment completed. Pt with increased strength, AROM, and PROM. Pt reports still having difficulty with reaching and sustaining reach overhead as well as activities involving abduction. Due to continued decrease in ROM, strength, and functional performance during ADLs, IADLs, and leisure, feel pt would benefit from additional 4 wks of therapy to optimize  independence and decrease pain. Continued myofascial release in sitting. Continued AAROM and fucntional reach with pulleys,  cones, UE bike, and wall washes. Providing verbal and tactile cues throughout for form and technique.    Body Structure / Function / Physical Skills ADL;Endurance;UE functional use;Fascial restriction;Pain;ROM;IADL;Strength;Edema    OT Treatment/Interventions Self-care/ADL training;Ultrasound;Patient/family education;Passive range of motion;Cryotherapy;Electrical Stimulation;Moist Heat;Therapeutic exercise;Manual Therapy;Therapeutic activities    Plan P: Will continue strengthening, ROM, functional reach, and IR/ER.           Patient will benefit from skilled therapeutic intervention in order to improve the following deficits and impairments:   Body Structure / Function / Physical Skills: ADL, Endurance, UE functional use, Fascial restriction, Pain, ROM, IADL, Strength, Edema       Visit Diagnosis: Acute pain of right shoulder  Stiffness of right shoulder, not elsewhere classified  Other symptoms and signs involving the musculoskeletal system    Problem List Patient Active Problem List   Diagnosis Date Noted  . Closed fracture of proximal end of right humerus with routine healing 02/25/2019  . Pre-ulcerative calluses 04/20/2018  . 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 Diabetic foot ulcer associated with type 2 diabetes mellitus ([place]) 07/07/2014  . Tobacco abuse 07/07/2014  . Infestation, maggots 07/07/2014  . Plantar ulcer of right foot ([place]) 05/12/2014  . Pulse weakness   . Peripheral neuropathy   . Cellulitis of right lower extremity 05/09/2014  . Obesity 05/09/2014  . Cellulitis of right lower leg 05/09/2014  . Hyperglycemia 03/21/2013  . IDDM (insulin dependent diabetes mellitus) 03/21/2013  . Noncompliance with medications due to cost issues 03/21/2013    Neal Dy, MSOT, OTR/L 07/02/2019, 3:13 PM  [place] [facility] [address] [place], [place], [address] Phone: [phone]   Fax:  [phone]  Name: Mitchell [name] MRN:  [id number] Date of Birth: 06-Jun-1950

## 2019-07-09 ENCOUNTER — Other Ambulatory Visit: Payer: Self-pay

## 2019-07-09 ENCOUNTER — Ambulatory Visit (HOSPITAL_COMMUNITY): Payer: Medicare HMO | Admitting: Occupational Therapy

## 2019-07-09 ENCOUNTER — Encounter (HOSPITAL_COMMUNITY): Payer: Self-pay | Admitting: Occupational Therapy

## 2019-07-09 DIAGNOSIS — M25611 Stiffness of right shoulder, not elsewhere classified: Secondary | ICD-10-CM | POA: Diagnosis not present

## 2019-07-09 DIAGNOSIS — R29898 Other symptoms and signs involving the musculoskeletal system: Secondary | ICD-10-CM | POA: Diagnosis not present

## 2019-07-09 DIAGNOSIS — M25511 Pain in right shoulder: Secondary | ICD-10-CM

## 2019-07-09 NOTE — Therapy (Signed)
[facility] [address] [place], [place], [address] Phone: [phone]   Fax:  [phone]  Occupational Therapy Treatment  Patient Details  Name: Mitchell [name] MRN: [id number] Date of Birth: [birth year] Referring Provider (OT): Richardean Canal, PA-C   Encounter Date: 07/09/2019   OT End of Session - 07/09/19 1700    Visit Number 17    Number of Visits 24    Date for OT Re-Evaluation 07/30/19    Authorization Type [employer] Medicare, $20 copay    Authorization Time Period No visit limit    Progress Note Due on Visit 20    OT Start Time 0815    OT Stop Time 0858    OT Time Calculation (min) 43 min    Activity Tolerance Patient tolerated treatment well    Behavior During Therapy [facility] for tasks assessed/performed           Past Medical History:  Diagnosis Date  . Arthritis   . Cellulitis of right lower extremity 05/09/2014  . Diabetes mellitus without complication (HCC)   . Diabetic foot ulcer (HCC) 07/07/2014   Status post bedside debridement of multiple right plantar ulcers by podiatrist, Dr. Reynolds Bowl.  . Hypercholesterolemia   . Maggot infestation    right foot ulcer  . Obesity 05/09/2014  . Tobacco abuse 07/07/2014    Past Surgical History:  Procedure Laterality Date  . APPENDECTOMY    . PERIPHERAL VASCULAR CATHETERIZATION N/A 07/22/2015   Procedure: Abdominal Aortogram w/Lower Extremity;  Surgeon: Sherren Kerns, MD;  Location: [facility] INVASIVE CV LAB;  Service: Cardiovascular;  Laterality: N/A;    There were no vitals filed for this visit.   Subjective Assessment - 07/09/19 0816    Subjective  S: "I work it a bit this morning" (refering to his shoulder)    Pertinent History Pt is a 69 y/o male s/p right proximal humerus fx sustained on 02/13/19 after falling at a restaurant. Pt reports he has very limited use of his RUE. Pt was referred to occupational therapy for evaluation and treatment by Richardean Canal, PA-C.    Currently in Pain? Yes      Pain Score 5     Pain Location Shoulder    Pain Orientation Right    Pain Type Acute pain                        OT Treatments/Exercises (OP) - 07/09/19 0837      Exercises   Exercises Shoulder      Shoulder Exercises: Seated   Row AAROM;20 reps    External Rotation AAROM;10 reps    Internal Rotation AAROM;10 reps    Flexion AROM;10 reps    Abduction AAROM;10 reps      Shoulder Exercises: Pulleys   Flexion Other (comment)   x10   Flexion Limitations Tactile cues at shoulder to decreaser compensatory hiking     ABduction Other (comment)   10x   ABduction Limitations Tactile cues at shoulder to decreaser compensatory hiking       Shoulder Exercises: ROM/Strengthening   UBE (Upper Arm Bike) resistence 3. pace ~5. 1' forward. 1' backwards    Wall Wash 10x forward flexion; 10x abduction    Over Head Lace 1' lacing and 1' taking out    Other ROM/Strengthening Exercises Forward flexion to place resistence pins on bar overhead; 1' on and 1' down      Manual Therapy   Manual Therapy Myofascial release  Manual therapy comments Manual therapy completed prior to exercises.     Myofascial Release myofascial release and manual stretching completed to right upper arm, trapezius, and scapularis region to decrease fascial restrictions and increase joint mobility in a pain free zone.                     OT Short Term Goals - 05/13/19 1647      OT SHORT TERM GOAL #1   Title Pt will be provided with and educated on HEP to improve mobility required for ADL completion using RUE.    Time 4    Period Weeks    Status On-going    Target Date 06/07/19      OT SHORT TERM GOAL #2   Title Pt will increase RUE P/ROM to [facility] to improve ability to use RUE as assist during dressing tasks.    Time 4    Period Weeks    Status On-going      OT SHORT TERM GOAL #3   Title Pt will improve RUE strength to 3+/5 to improve ability to reach items at waist to chest height.     Time 4    Period Weeks    Status On-going             OT Long Term Goals - 05/13/19 1647      OT LONG TERM GOAL #1   Title Pt will decrease pain in RUE to 2/10 or less to improve ability to use RUE as dominant during ADL completion.    Time 8    Period Weeks    Status On-going      OT LONG TERM GOAL #2   Title Pt will increase RUE A/ROM to [facility] to improve ability to reach overhead into cabinets when completing housework.    Time 8    Period Weeks    Status On-going      OT LONG TERM GOAL #3   Title Pt will decrease RUE fascial restrictions to minimal amounts or less to improve mobility required for functional reaching tasks.    Time 8    Period Weeks    Status On-going      OT LONG TERM GOAL #4   Title Pt will increase RUE strength to 4/5 or greater to improve ability to perform fishing activities.    Time 8    Period Weeks    Status On-going                 Plan - 07/09/19 1701    Clinical Impression Statement A: Continued with myofascial release and PROM to decrease pain and restriction with ROM. Covused session on AAROM, AROM, and strengthening. Pt performing AAROM with dowel rod, wall stretch, and pulleys. Pt also performing over head lacing, clothes pin over head, and UE bike. Providing cues throughout on for and technique.    Body Structure / Function / Physical Skills ADL;Endurance;UE functional use;Fascial restriction;Pain;ROM;IADL;Strength;Edema    OT Treatment/Interventions Self-care/ADL training;Ultrasound;Patient/family education;Passive range of motion;Cryotherapy;Electrical Stimulation;Moist Heat;Therapeutic exercise;Manual Therapy;Therapeutic activities    Plan P: Will continue strengthening, ROM, functional reach, and IR/ER.           Patient will benefit from skilled therapeutic intervention in order to improve the following deficits and impairments:   Body Structure / Function / Physical Skills: ADL, Endurance, UE functional use, Fascial  restriction, Pain, ROM, IADL, Strength, Edema       Visit Diagnosis: Acute pain of right shoulder  Stiffness of right shoulder, not elsewhere classified  Other symptoms and signs involving the musculoskeletal system    Problem List Patient Active Problem List   Diagnosis Date Noted  . Closed fracture of proximal end of right humerus with routine healing 02/25/2019  . Pre-ulcerative calluses 04/20/2018  . 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 Diabetic foot ulcer associated with type 2 diabetes mellitus ([place]) 07/07/2014  . Tobacco abuse 07/07/2014  . Infestation, maggots 07/07/2014  . Plantar ulcer of right foot ([place]) 05/12/2014  . Pulse weakness   . Peripheral neuropathy   . Cellulitis of right lower extremity 05/09/2014  . Obesity 05/09/2014  . Cellulitis of right lower leg 05/09/2014  . Hyperglycemia 03/21/2013  . IDDM (insulin dependent diabetes mellitus) 03/21/2013  . Noncompliance with medications due to cost issues 03/21/2013    Neal Dy, MSOT, OTR/L 07/09/2019, 5:05 PM  [place] [address] [place], [place], [address] Phone: [phone]   Fax:  [phone]  Name: Mitchell [name] MRN: [id number] Date of Birth: 19-Jun-1950

## 2019-07-10 ENCOUNTER — Encounter (HOSPITAL_COMMUNITY): Payer: Self-pay | Admitting: Occupational Therapy

## 2019-07-10 ENCOUNTER — Ambulatory Visit (HOSPITAL_COMMUNITY): Payer: Medicare HMO | Admitting: Occupational Therapy

## 2019-07-10 DIAGNOSIS — R29898 Other symptoms and signs involving the musculoskeletal system: Secondary | ICD-10-CM

## 2019-07-10 DIAGNOSIS — M25511 Pain in right shoulder: Secondary | ICD-10-CM | POA: Diagnosis not present

## 2019-07-10 DIAGNOSIS — M25611 Stiffness of right shoulder, not elsewhere classified: Secondary | ICD-10-CM

## 2019-07-10 NOTE — Therapy (Signed)
[facility] [address] [place], [place], [address] Phone: [phone]   Fax:  [phone]  Occupational Therapy Treatment  Patient Details  Name: Mitchell [name] MRN: [id number] Date of Birth: 07/29/1950 Referring Provider (OT): Richardean Canal, PA-C   Encounter Date: 07/10/2019   OT End of Session - 07/10/19 1603    Visit Number 18    Number of Visits 24    Date for OT Re-Evaluation 07/30/19    Authorization Type [employer] Medicare, $20 copay    Authorization Time Period No visit limit    Progress Note Due on Visit 20    OT Start Time 1515    OT Stop Time 1554    OT Time Calculation (min) 39 min    Activity Tolerance Patient tolerated treatment well    Behavior During Therapy [facility] for tasks assessed/performed           Past Medical History:  Diagnosis Date  . Arthritis   . Cellulitis of right lower extremity 05/09/2014  . Diabetes mellitus without complication (HCC)   . Diabetic foot ulcer (HCC) 07/07/2014   Status post bedside debridement of multiple right plantar ulcers by podiatrist, Dr. Reynolds Bowl.  . Hypercholesterolemia   . Maggot infestation    right foot ulcer  . Obesity 05/09/2014  . Tobacco abuse 07/07/2014    Past Surgical History:  Procedure Laterality Date  . APPENDECTOMY    . PERIPHERAL VASCULAR CATHETERIZATION N/A 07/22/2015   Procedure: Abdominal Aortogram w/Lower Extremity;  Surgeon: Sherren Kerns, MD;  Location: [facility] INVASIVE CV LAB;  Service: Cardiovascular;  Laterality: N/A;    There were no vitals filed for this visit.   Subjective Assessment - 07/10/19 1513    Subjective  S: It's not been too bad.    Currently in Pain? Yes    Pain Score 4     Pain Location Shoulder    Pain Orientation Right    Pain Descriptors / Indicators Sore    Pain Type Acute pain    Pain Radiating Towards N/A    Pain Onset More than a month ago    Pain Frequency Intermittent    Aggravating Factors  movement, use    Pain Relieving Factors  pain medication    Effect of Pain on Daily Activities mod effect on ADLs    Multiple Pain Sites No              [facility] OT Assessment - 07/10/19 1513      Assessment   Medical Diagnosis right proximal humerus fracture    Referring Provider (OT) Richardean Canal, PA-C      Precautions   Precautions None                    OT Treatments/Exercises (OP) - 07/10/19 1518      Exercises   Exercises Shoulder      Shoulder Exercises: Supine   Protraction Strengthening;10 reps    Protraction Weight (lbs) 2    Horizontal ABduction Strengthening;10 reps    Horizontal ABduction Weight (lbs) 2    External Rotation Strengthening;10 reps    External Rotation Weight (lbs) 2    Internal Rotation Strengthening;10 reps    Internal Rotation Weight (lbs) 2    Flexion Strengthening;10 reps    Shoulder Flexion Weight (lbs) 2    ABduction Strengthening;10 reps    Shoulder ABduction Weight (lbs) 2      Shoulder Exercises: Standing   Horizontal ABduction Theraband;10 reps  Theraband Level (Shoulder Horizontal ABduction) Level 2 (Red)    Diagonals Theraband;10 reps    Theraband Level (Shoulder Diagonals) Level 2 (Red)      Shoulder Exercises: Therapy Ball   Other Therapy Ball Exercises green therapy ball: chest press, flexion, circles each direction, 10X      Shoulder Exercises: ROM/Strengthening   Other ROM/Strengthening Exercises modified 90/90 carry: 2# holding weighted green ball      Functional Reaching Activities   Mid Level Pt working on casting fishing line from right side and across body. No difficulty with short casts.                     OT Short Term Goals - 05/13/19 1647      OT SHORT TERM GOAL #1   Title Pt will be provided with and educated on HEP to improve mobility required for ADL completion using RUE.    Time 4    Period Weeks    Status On-going    Target Date 06/07/19      OT SHORT TERM GOAL #2   Title Pt will increase RUE P/ROM to [facility] to  improve ability to use RUE as assist during dressing tasks.    Time 4    Period Weeks    Status On-going      OT SHORT TERM GOAL #3   Title Pt will improve RUE strength to 3+/5 to improve ability to reach items at waist to chest height.    Time 4    Period Weeks    Status On-going             OT Long Term Goals - 05/13/19 1647      OT LONG TERM GOAL #1   Title Pt will decrease pain in RUE to 2/10 or less to improve ability to use RUE as dominant during ADL completion.    Time 8    Period Weeks    Status On-going      OT LONG TERM GOAL #2   Title Pt will increase RUE A/ROM to [facility] to improve ability to reach overhead into cabinets when completing housework.    Time 8    Period Weeks    Status On-going      OT LONG TERM GOAL #3   Title Pt will decrease RUE fascial restrictions to minimal amounts or less to improve mobility required for functional reaching tasks.    Time 8    Period Weeks    Status On-going      OT LONG TERM GOAL #4   Title Pt will increase RUE strength to 4/5 or greater to improve ability to perform fishing activities.    Time 8    Period Weeks    Status On-going                 Plan - 07/10/19 1603    Clinical Impression Statement A: Pt reports difficulty with casting fishing line and and moving furniture around the house. Does not have difficulty with dressing or bathing tasks at this time. Session focusing on strengthening and functional reaching needed to successfully cast a line and complete heavy work tasks. Pt completing supine strengthening with 2# and therapy ball strengthening today. Added pnf pattern with theraband and a modified 90/90 carry. Verbal cuing for form and technique.    Body Structure / Function / Physical Skills ADL;Endurance;UE functional use;Fascial restriction;Pain;ROM;IADL;Strength;Edema    Plan P: discontinue manual therapy, focus on functional reaching,  pnf patterns with theraband, IR/er work           Patient  will benefit from skilled therapeutic intervention in order to improve the following deficits and impairments:   Body Structure / Function / Physical Skills: ADL, Endurance, UE functional use, Fascial restriction, Pain, ROM, IADL, Strength, Edema       Visit Diagnosis: Acute pain of right shoulder  Stiffness of right shoulder, not elsewhere classified  Other symptoms and signs involving the musculoskeletal system    Problem List Patient Active Problem List   Diagnosis Date Noted  . Closed fracture of proximal end of right humerus with routine healing 02/25/2019  . Pre-ulcerative calluses 04/20/2018  . 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 Diabetic foot ulcer associated with type 2 diabetes mellitus (HCC) 07/07/2014  . Tobacco abuse 07/07/2014  . Infestation, maggots 07/07/2014  . Plantar ulcer of right foot (HCC) 05/12/2014  . Pulse weakness   . Peripheral neuropathy   . Cellulitis of right lower extremity 05/09/2014  . Obesity 05/09/2014  . Cellulitis of right lower leg 05/09/2014  . Hyperglycemia 03/21/2013  . IDDM (insulin dependent diabetes mellitus) 03/21/2013  . Noncompliance with medications due to cost issues 03/21/2013   Ezra Sites, OTR/L  [phone] 07/10/2019, 4:06 PM  [facility] [address] [place], [place], [address] Phone: [phone]   Fax:  [phone]  Name: Bryten Maher MRN: [id number] Date of Birth: 09-25-1950

## 2019-07-15 DIAGNOSIS — E119 Type 2 diabetes mellitus without complications: Secondary | ICD-10-CM | POA: Diagnosis not present

## 2019-07-15 DIAGNOSIS — E7800 Pure hypercholesterolemia, unspecified: Secondary | ICD-10-CM | POA: Diagnosis not present

## 2019-07-16 ENCOUNTER — Ambulatory Visit (HOSPITAL_COMMUNITY): Payer: Medicare HMO | Attending: Podiatry | Admitting: Specialist

## 2019-07-16 ENCOUNTER — Encounter (HOSPITAL_COMMUNITY): Payer: Self-pay | Admitting: Specialist

## 2019-07-16 ENCOUNTER — Other Ambulatory Visit: Payer: Self-pay

## 2019-07-16 DIAGNOSIS — R29898 Other symptoms and signs involving the musculoskeletal system: Secondary | ICD-10-CM | POA: Diagnosis not present

## 2019-07-16 DIAGNOSIS — M25611 Stiffness of right shoulder, not elsewhere classified: Secondary | ICD-10-CM | POA: Diagnosis not present

## 2019-07-16 DIAGNOSIS — M25511 Pain in right shoulder: Secondary | ICD-10-CM | POA: Insufficient documentation

## 2019-07-16 NOTE — Therapy (Signed)
[place] [facility] [address] [place], [place], [address] Phone: [phone]   Fax:  [phone]  Occupational Therapy Treatment  Patient Details  Name: Mitchell [name] MRN: [id number] Date of Birth: 1950-04-22 Referring Provider (OT): Richardean Canal, PA-C   Encounter Date: 07/16/2019   OT End of Session - 07/16/19 1618    Visit Number 19    Number of Visits 24    Date for OT Re-Evaluation 07/30/19    Authorization Type [employer] Medicare, $20 copay    Authorization Time Period No visit limit    Progress Note Due on Visit 20    OT Start Time 1435    OT Stop Time 1520    OT Time Calculation (min) 45 min    Activity Tolerance Patient tolerated treatment well    Behavior During Therapy [facility] for tasks assessed/performed           Past Medical History:  Diagnosis Date  . Arthritis   . Cellulitis of right lower extremity 05/09/2014  . Diabetes mellitus without complication (HCC)   . Diabetic foot ulcer (HCC) 07/07/2014   Status post bedside debridement of multiple right plantar ulcers by podiatrist, Dr. Reynolds Bowl.  . Hypercholesterolemia   . Maggot infestation    right foot ulcer  . Obesity 05/09/2014  . Tobacco abuse 07/07/2014    Past Surgical History:  Procedure Laterality Date  . APPENDECTOMY    . PERIPHERAL VASCULAR CATHETERIZATION N/A 07/22/2015   Procedure: Abdominal Aortogram w/Lower Extremity;  Surgeon: Sherren Kerns, MD;  Location: [facility] INVASIVE CV LAB;  Service: Cardiovascular;  Laterality: N/A;    There were no vitals filed for this visit.   Subjective Assessment - 07/16/19 1616    Subjective  S:  I have some soreness under my arm.    Currently in Pain? Yes    Pain Score 4     Pain Location Shoulder    Pain Orientation Right    Pain Descriptors / Indicators Aching    Pain Type Chronic pain              [facility] OT Assessment - 07/16/19 0001      Assessment   Medical Diagnosis right proximal humerus fracture    Referring Provider  (OT) Richardean Canal, PA-C      Precautions   Precautions None                    OT Treatments/Exercises (OP) - 07/16/19 0001      Shoulder Exercises: Supine   Protraction PROM;10 reps    Horizontal ABduction PROM;10 reps    External Rotation PROM;10 reps    Internal Rotation PROM;10 reps    Flexion PROM;10 reps    ABduction PROM;10 reps      Shoulder Exercises: Standing   External Rotation Theraband;10 reps    Theraband Level (Shoulder External Rotation) Level 3 (Green)    External Rotation Weight (lbs) holding towel between arm and body to avoid compensatory movments of arm and improve form    Internal Rotation Theraband;15 reps    Theraband Level (Shoulder Internal Rotation) Level 3 (Green)    Internal Rotation Limitations holding towel between arm and side for improved form and technique    Other Standing Exercises Standing at kitchen counter, patient removed food boxes from first, second , third shelf of cabinet and then replaced, reacing first and second shelf without difficulty and 3rd with slight diffiuclty.       Shoulder Exercises:  Therapy Ball   Other Therapy Ball Exercises holding large pink beachball completed chest press, overhead press, overhead v, flexion, and PNF 1 and 2 15 times each.        Manual Therapy   Manual Therapy Myofascial release    Manual therapy comments Manual therapy completed prior to exercises.     Myofascial Release myofascial release and manual stretching completed to right upper arm, trapezius, and scapularis region to decrease fascial restrictions and increase joint mobility in a pain free zone.                     OT Short Term Goals - 05/13/19 1647      OT SHORT TERM GOAL #1   Title Pt will be provided with and educated on HEP to improve mobility required for ADL completion using RUE.    Time 4    Period Weeks    Status On-going    Target Date 06/07/19      OT SHORT TERM GOAL #2   Title Pt will increase RUE  P/ROM to [facility] to improve ability to use RUE as assist during dressing tasks.    Time 4    Period Weeks    Status On-going      OT SHORT TERM GOAL #3   Title Pt will improve RUE strength to 3+/5 to improve ability to reach items at waist to chest height.    Time 4    Period Weeks    Status On-going             OT Long Term Goals - 05/13/19 1647      OT LONG TERM GOAL #1   Title Pt will decrease pain in RUE to 2/10 or less to improve ability to use RUE as dominant during ADL completion.    Time 8    Period Weeks    Status On-going      OT LONG TERM GOAL #2   Title Pt will increase RUE A/ROM to [facility] to improve ability to reach overhead into cabinets when completing housework.    Time 8    Period Weeks    Status On-going      OT LONG TERM GOAL #3   Title Pt will decrease RUE fascial restrictions to minimal amounts or less to improve mobility required for functional reaching tasks.    Time 8    Period Weeks    Status On-going      OT LONG TERM GOAL #4   Title Pt will increase RUE strength to 4/5 or greater to improve ability to perform fishing activities.    Time 8    Period Weeks    Status On-going                 Plan - 07/16/19 1618    Clinical Impression Statement A:  Patient reports soreness in right axillary region, therefore completed manual therapy this date.  session focused on functional reaching and sustained activity tolerance in right shoulder region.  Patient able to reach 1st and 2nd shelf of cabinet in kitchen at this clinic without difficulty.  Patient able to reach 3rd shelf with slight difficulty.    Body Structure / Function / Physical Skills ADL;Endurance;UE functional use;Fascial restriction;Pain;ROM;IADL;Strength;Edema    Plan P:  discontinue manual therapy to as needed, follow up on HEP (what is the functional task that you have difficulty completing).  Continue IR ER carry and other functional reaching tasks.  Patient will benefit  from skilled therapeutic intervention in order to improve the following deficits and impairments:   Body Structure / Function / Physical Skills: ADL, Endurance, UE functional use, Fascial restriction, Pain, ROM, IADL, Strength, Edema       Visit Diagnosis: Acute pain of right shoulder  Stiffness of right shoulder, not elsewhere classified  Other symptoms and signs involving the musculoskeletal system    Problem List Patient Active Problem List   Diagnosis Date Noted  . Closed fracture of proximal end of right humerus with routine healing 02/25/2019  . Pre-ulcerative calluses 04/20/2018  . 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 Diabetic foot ulcer associated with type 2 diabetes mellitus (HCC) 07/07/2014  . Tobacco abuse 07/07/2014  . Infestation, maggots 07/07/2014  . Plantar ulcer of right foot (HCC) 05/12/2014  . Pulse weakness   . Peripheral neuropathy   . Cellulitis of right lower extremity 05/09/2014  . Obesity 05/09/2014  . Cellulitis of right lower leg 05/09/2014  . Hyperglycemia 03/21/2013  . IDDM (insulin dependent diabetes mellitus) 03/21/2013  . Noncompliance with medications due to cost issues 03/21/2013    Shirlean Mylar, [place], OTR/L [phone]  07/16/2019, 4:25 PM  [place] [facility] [address] [place], [place], [address] Phone: [phone]   Fax:  [phone]  Name: Mitchell [name] MRN: [id number] Date of Birth: March 17, 1950

## 2019-07-17 ENCOUNTER — Encounter (HOSPITAL_COMMUNITY): Payer: Self-pay | Admitting: Occupational Therapy

## 2019-07-17 ENCOUNTER — Ambulatory Visit (HOSPITAL_COMMUNITY): Payer: Medicare HMO | Admitting: Occupational Therapy

## 2019-07-17 DIAGNOSIS — M25611 Stiffness of right shoulder, not elsewhere classified: Secondary | ICD-10-CM | POA: Diagnosis not present

## 2019-07-17 DIAGNOSIS — M25511 Pain in right shoulder: Secondary | ICD-10-CM | POA: Diagnosis not present

## 2019-07-17 DIAGNOSIS — R29898 Other symptoms and signs involving the musculoskeletal system: Secondary | ICD-10-CM | POA: Diagnosis not present

## 2019-07-17 NOTE — Therapy (Signed)
[place] [facility] [address] [place], [place], [address] Phone: [phone]   Fax:  [phone]  Occupational Therapy Treatment  Patient Details  Name: Mitchell [name] MRN: [id number] Date of Birth: March 06, 1950 Referring Provider (OT): Richardean Canal, PA-C   Progress Note Reporting Period 06/16/2019 to 07/17/2019  See note below for Objective Data and Assessment of Progress/Goals.   Encounter Date: 07/17/2019   OT End of Session - 07/17/19 1138    Visit Number 20    Number of Visits 24    Date for OT Re-Evaluation 07/30/19    Authorization Type [employer] Medicare, $20 copay    Authorization Time Period No visit limit    Progress Note Due on Visit 30   OT Start Time 1033    OT Stop Time 1116    OT Time Calculation (min) 43 min    Activity Tolerance Patient tolerated treatment well    Behavior During Therapy [facility] for tasks assessed/performed           Past Medical History:  Diagnosis Date  . Arthritis   . Cellulitis of right lower extremity 05/09/2014  . Diabetes mellitus without complication (HCC)   . Diabetic foot ulcer (HCC) 07/07/2014   Status post bedside debridement of multiple right plantar ulcers by podiatrist, Dr. Reynolds Bowl.  . Hypercholesterolemia   . Maggot infestation    right foot ulcer  . Obesity 05/09/2014  . Tobacco abuse 07/07/2014    Past Surgical History:  Procedure Laterality Date  . APPENDECTOMY    . PERIPHERAL VASCULAR CATHETERIZATION N/A 07/22/2015   Procedure: Abdominal Aortogram w/Lower Extremity;  Surgeon: Sherren Kerns, MD;  Location: [facility] INVASIVE CV LAB;  Service: Cardiovascular;  Laterality: N/A;    There were no vitals filed for this visit.   Subjective Assessment - 07/17/19 1035    Subjective  S; I don't understand why I am still having pain, it's getting to where I can use it more but it still hurts    Pertinent History Pt is a 69 y/o male s/p right proximal humerus fx sustained on 02/13/19 after falling at a  restaurant. Pt reports he has very limited use of his RUE. Pt was referred to occupational therapy for evaluation and treatment by Richardean Canal, PA-C.    Pain Score 5     Pain Location Shoulder    Pain Orientation Right    Pain Descriptors / Indicators Aching    Pain Type Chronic pain    Pain Onset More than a month ago    Pain Frequency Intermittent    Aggravating Factors  exercise, movement    Pain Relieving Factors pain medication    Effect of Pain on Daily Activities mod effect on ADLs    Multiple Pain Sites No                        OT Treatments/Exercises (OP) - 07/17/19 1036      Exercises   Exercises Shoulder      Shoulder Exercises: Supine   Protraction PROM;10 reps    Horizontal ABduction PROM;10 reps    External Rotation PROM;10 reps    Internal Rotation PROM;10 reps    Flexion PROM;10 reps    ABduction PROM;10 reps      Shoulder Exercises: Seated   Extension AAROM;10 reps    Row AAROM;20 reps    Flexion AAROM;10 reps    Abduction AAROM;10 reps      Shoulder Exercises: Standing  External Rotation Theraband;10 reps    Theraband Level (Shoulder External Rotation) Level 3 (Green)    Internal Rotation Theraband;15 reps    Theraband Level (Shoulder Internal Rotation) Level 3 (Green)    Internal Rotation Limitations holding towel between arm and side for improved form and technique    Other Standing Exercises Standing at kitchen counter, Pt removed 10 boxes from 1st and 2nd shelf and then replaced. Rest break between replacing items due to increased muscle tension      Shoulder Exercises: ROM/Strengthening   UBE (Upper Arm Bike) 2 minutes forward, 2 minutes backwards, 5 pace     Wall Wash 10x forward flexion; 10x abduction      Moist Heat Therapy   Number Minutes Moist Heat 5 Minutes    Moist Heat Location Shoulder                    OT Short Term Goals - 05/13/19 1647      OT SHORT TERM GOAL #1   Title Pt will be provided with and  educated on HEP to improve mobility required for ADL completion using RUE.    Time 4    Period Weeks    Status On-going    Target Date 06/07/19      OT SHORT TERM GOAL #2   Title Pt will increase RUE P/ROM to [facility] to improve ability to use RUE as assist during dressing tasks.    Time 4    Period Weeks    Status On-going      OT SHORT TERM GOAL #3   Title Pt will improve RUE strength to 3+/5 to improve ability to reach items at waist to chest height.    Time 4    Period Weeks    Status On-going             OT Long Term Goals - 05/13/19 1647      OT LONG TERM GOAL #1   Title Pt will decrease pain in RUE to 2/10 or less to improve ability to use RUE as dominant during ADL completion.    Time 8    Period Weeks    Status On-going      OT LONG TERM GOAL #2   Title Pt will increase RUE A/ROM to [facility] to improve ability to reach overhead into cabinets when completing housework.    Time 8    Period Weeks    Status On-going      OT LONG TERM GOAL #3   Title Pt will decrease RUE fascial restrictions to minimal amounts or less to improve mobility required for functional reaching tasks.    Time 8    Period Weeks    Status On-going      OT LONG TERM GOAL #4   Title Pt will increase RUE strength to 4/5 or greater to improve ability to perform fishing activities.    Time 8    Period Weeks    Status On-going                 Plan - 07/17/19 1140    Clinical Impression Statement A: Patient reports continued tightness in axillary region. Trialed use of heat and PROM with good success. Patient reports he is able to complete all ADL tasks except for picking up his dog to bathe him. Continued use of funcitonal reaching and theraband, verbal cues provided for form and technique.    Body Structure / Function / Physical Skills  ADL;Endurance;UE functional use;Fascial restriction;Pain;ROM;IADL;Strength;Edema    Plan P:  discontinue manual therapy to as needed, follow up on HEP (what is  the functional task that you have difficulty completing).  Continue IR ER carry and other functional reaching tasks.    OT Home Exercise Plan AAROM with PVC pipe; hand out provided    Consulted and Agree with Plan of Care Patient           Patient will benefit from skilled therapeutic intervention in order to improve the following deficits and impairments:   Body Structure / Function / Physical Skills: ADL, Endurance, UE functional use, Fascial restriction, Pain, ROM, IADL, Strength, Edema       Visit Diagnosis: Acute pain of right shoulder  Stiffness of right shoulder, not elsewhere classified  Other symptoms and signs involving the musculoskeletal system    Problem List Patient Active Problem List   Diagnosis Date Noted  . Closed fracture of proximal end of right humerus with routine healing 02/25/2019  . Pre-ulcerative calluses 04/20/2018  . 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 Diabetic foot ulcer associated with type 2 diabetes mellitus (HCC) 07/07/2014  . Tobacco abuse 07/07/2014  . Infestation, maggots 07/07/2014  . Plantar ulcer of right foot (HCC) 05/12/2014  . Pulse weakness   . Peripheral neuropathy   . Cellulitis of right lower extremity 05/09/2014  . Obesity 05/09/2014  . Cellulitis of right lower leg 05/09/2014  . Hyperglycemia 03/21/2013  . IDDM (insulin dependent diabetes mellitus) 03/21/2013  . Noncompliance with medications due to cost issues 03/21/2013    Horris Latino, OTR/L 07/17/2019, 4:06 PM  [place] [facility] [address] [place], [place], [address] Phone: [phone]   Fax:  [phone]  Name: Ladarrius Bogdanski MRN: [id number] Date of Birth: [birth year]

## 2019-07-24 ENCOUNTER — Ambulatory Visit (HOSPITAL_COMMUNITY): Payer: Medicare HMO | Admitting: Occupational Therapy

## 2019-07-24 ENCOUNTER — Encounter (HOSPITAL_COMMUNITY): Payer: Self-pay | Admitting: Occupational Therapy

## 2019-07-24 ENCOUNTER — Other Ambulatory Visit: Payer: Self-pay

## 2019-07-24 DIAGNOSIS — M25511 Pain in right shoulder: Secondary | ICD-10-CM

## 2019-07-24 DIAGNOSIS — M25611 Stiffness of right shoulder, not elsewhere classified: Secondary | ICD-10-CM

## 2019-07-24 DIAGNOSIS — R29898 Other symptoms and signs involving the musculoskeletal system: Secondary | ICD-10-CM | POA: Diagnosis not present

## 2019-07-24 NOTE — Patient Instructions (Signed)

## 2019-07-24 NOTE — Therapy (Signed)
[facility] [address] [place], [place], [address] Phone: [phone]   Fax:  [phone]  Occupational Therapy Treatment  Patient Details  Name: Isac Lincks MRN: [id number] Date of Birth: 1950/06/01 Referring Provider (OT): Richardean Canal, PA-C   Encounter Date: 07/24/2019   OT End of Session - 07/24/19 1122    Visit Number 21    Number of Visits 24    Date for OT Re-Evaluation 07/30/19    Authorization Type [employer], $20 copay    Authorization Time Period No visit limit    Progress Note Due on Visit 30    OT Start Time 1030    OT Stop Time 1113    OT Time Calculation (min) 43 min    Activity Tolerance Patient tolerated treatment well    Behavior During Therapy [facility] for tasks assessed/performed           Past Medical History:  Diagnosis Date  . Arthritis   . Cellulitis of right lower extremity 05/09/2014  . Diabetes mellitus without complication (HCC)   . Diabetic foot ulcer (HCC) 07/07/2014   Status post bedside debridement of multiple right plantar ulcers by podiatrist, Dr. Reynolds Bowl.  . Hypercholesterolemia   . Maggot infestation    right foot ulcer  . Obesity 05/09/2014  . Tobacco abuse 07/07/2014    Past Surgical History:  Procedure Laterality Date  . APPENDECTOMY    . PERIPHERAL VASCULAR CATHETERIZATION N/A 07/22/2015   Procedure: Abdominal Aortogram w/Lower Extremity;  Surgeon: Sherren Kerns, MD;  Location: [facility] INVASIVE CV LAB;  Service: Cardiovascular;  Laterality: N/A;    There were no vitals filed for this visit.   Subjective Assessment - 07/24/19 1033    Subjective  S: Every morning I am waking up sore.    Pertinent History Pt is a 69 y/o male s/p right proximal humerus fx sustained on 02/13/19 after falling at a restaurant. Pt reports he has very limited use of his RUE. Pt was referred to occupational therapy for evaluation and treatment by Richardean Canal, PA-C.    Currently in Pain? Yes    Pain Score 4     Pain  Location Shoulder    Pain Orientation Right                        OT Treatments/Exercises (OP) - 07/24/19 1038      ADLs   Leisure Casting activity ourdoors. Pt casting fishing rod ~30 times. Demonstrating increased ROM and tolerance. Concerned about heavier fish to real in. Discussed way pt's will need to protect his arm such as having a friend real in heavier objects for him.       Exercises   Exercises Shoulder      Shoulder Exercises: Seated   Elevation AROM;10 reps   Hold three sec   Extension AROM;10 reps   Hold for 3 sec   Retraction AROM;10 reps   hold for 3 sec   Other Seated Exercises Pink beach ball; AAROM. Chest presses, Overhead press, shoulder flexion, horizonatal abduction. 20x      Shoulder Exercises: Stretch   Cross Chest Stretch 2 reps;10 seconds    Wall Stretch - Flexion 2 reps;10 seconds    Wall Stretch - ABduction 2 reps;10 seconds    Other Shoulder Stretches Door stretch; hold 10 sec; 2x    Other Shoulder Stretches Internal rotation with horizontal towel; 10 sec, 2x      Manual Therapy  Manual Therapy Myofascial release    Manual therapy comments Manual therapy completed prior to exercises.     Myofascial Release myofascial release and manual stretching completed to right upper arm, trapezius, and scapularis region to decrease fascial restrictions and increase joint mobility in a pain free zone.                   OT Education - 07/24/19 1121    Education Details Providing education and handout on shoulder stretches. Pt demonstrating understanding. Also issuing green theraband and reviewed theraband exercises.    Person(s) Educated Patient    Methods Explanation;Demonstration;Handout    Comprehension Verbalized understanding;Returned demonstration            OT Short Term Goals - 05/13/19 1647      OT SHORT TERM GOAL #1   Title Pt will be provided with and educated on HEP to improve mobility required for ADL completion using  RUE.    Time 4    Period Weeks    Status On-going    Target Date 06/07/19      OT SHORT TERM GOAL #2   Title Pt will increase RUE P/ROM to [facility] to improve ability to use RUE as assist during dressing tasks.    Time 4    Period Weeks    Status On-going      OT SHORT TERM GOAL #3   Title Pt will improve RUE strength to 3+/5 to improve ability to reach items at waist to chest height.    Time 4    Period Weeks    Status On-going             OT Long Term Goals - 05/13/19 1647      OT LONG TERM GOAL #1   Title Pt will decrease pain in RUE to 2/10 or less to improve ability to use RUE as dominant during ADL completion.    Time 8    Period Weeks    Status On-going      OT LONG TERM GOAL #2   Title Pt will increase RUE A/ROM to [facility] to improve ability to reach overhead into cabinets when completing housework.    Time 8    Period Weeks    Status On-going      OT LONG TERM GOAL #3   Title Pt will decrease RUE fascial restrictions to minimal amounts or less to improve mobility required for functional reaching tasks.    Time 8    Period Weeks    Status On-going      OT LONG TERM GOAL #4   Title Pt will increase RUE strength to 4/5 or greater to improve ability to perform fishing activities.    Time 8    Period Weeks    Status On-going                 Plan - 07/24/19 1123    Clinical Impression Statement A: Reporting soreness at shoulder and traps. Performing myofascial release minimally at begining of session to allow for decrease pain and increase normalized movement. Performing AAROM with pink beach ball for strengthening and endurance. Providing handout and education on shoulder stretches. Pt demonstrating understanding. Performing casting simulation and pt reporting increased ROM to cast fishing rod. However, concerned about realing in heavier fish. Discussed way in which pt will need to protect that arm such as cutting line or having friend real in heavier fish.  Providing green theraband and reviewed exercises. Cues  thorughout for form and technique.    Body Structure / Function / Physical Skills ADL;Endurance;UE functional use;Fascial restriction;Pain;ROM;IADL;Strength;Edema    Plan P:  Re-assessment. measurements, and dc. Follow up on HEP and practice fishing. Continue to discuss activities that are difficult and discuss compensatory techniques.    OT Home Exercise Plan AAROM with PVC pipe; hand out provided. 7/9 shoulder stretches handout and green theraband.    Consulted and Agree with Plan of Care Patient           Patient will benefit from skilled therapeutic intervention in order to improve the following deficits and impairments:   Body Structure / Function / Physical Skills: ADL, Endurance, UE functional use, Fascial restriction, Pain, ROM, IADL, Strength, Edema       Visit Diagnosis: Acute pain of right shoulder  Stiffness of right shoulder, not elsewhere classified  Other symptoms and signs involving the musculoskeletal system    Problem List Patient Active Problem List   Diagnosis Date Noted  . Closed fracture of proximal end of right humerus with routine healing 02/25/2019  . Pre-ulcerative calluses 04/20/2018  . 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 Diabetic foot ulcer associated with type 2 diabetes mellitus (HCC) 07/07/2014  . Tobacco abuse 07/07/2014  . Infestation, maggots 07/07/2014  . Plantar ulcer of right foot (HCC) 05/12/2014  . Pulse weakness   . Peripheral neuropathy   . Cellulitis of right lower extremity 05/09/2014  . Obesity 05/09/2014  . Cellulitis of right lower leg 05/09/2014  . Hyperglycemia 03/21/2013  . IDDM (insulin dependent diabetes mellitus) 03/21/2013  . Noncompliance with medications due to cost issues 03/21/2013    Gabriel Rung, MSOT, OTR/L 07/24/2019, 11:41 AM  [facility] [address] [place], [place], [address] Phone: [phone]   Fax:   [phone]  Name: Sameul Tagle MRN: [id number] Date of Birth: Jul 21, 1950

## 2019-07-31 ENCOUNTER — Ambulatory Visit (HOSPITAL_COMMUNITY): Payer: Medicare HMO | Admitting: Occupational Therapy

## 2019-07-31 ENCOUNTER — Other Ambulatory Visit: Payer: Self-pay

## 2019-07-31 DIAGNOSIS — M25511 Pain in right shoulder: Secondary | ICD-10-CM | POA: Diagnosis not present

## 2019-07-31 DIAGNOSIS — R29898 Other symptoms and signs involving the musculoskeletal system: Secondary | ICD-10-CM

## 2019-07-31 DIAGNOSIS — M25611 Stiffness of right shoulder, not elsewhere classified: Secondary | ICD-10-CM | POA: Diagnosis not present

## 2019-07-31 NOTE — Patient Instructions (Signed)

## 2019-07-31 NOTE — Therapy (Signed)
[facility] [place], [place], [address] Phone: [phone]   Fax:  [phone]  Occupational Therapy Treatment  Patient Details  Name: Mitchell [name] MRN: [id number] Date of Birth: Jan 16, 1950 Referring Provider (OT): Erskine Emery, PA-C   OCCUPATIONAL THERAPY DISCHARGE SUMMARY  Visits from Start of Care: 05/08/19  Current functional level related to goals / functional outcomes: Able to perform majority of ADLs and IADLs.    Remaining deficits: Limited AROM in abduction and continued pain.    Education / Equipment: Provided further HEP for stretches, strengthening, and ROM.   Plan: Patient agrees to discharge.  Patient goals were partially met. Patient is being discharged due to meeting the stated rehab goals.  ?????          Encounter Date: 07/31/2019   OT End of Session - 07/31/19 1443    Visit Number 22    Number of Visits 24    Date for OT Re-Evaluation 07/30/19    Authorization Type [employer] Medicare, $20 copay    Authorization Time Period No visit limit    Progress Note Due on Visit 30    OT Start Time 1345    OT Stop Time 1433    OT Time Calculation (min) 48 min    Activity Tolerance Patient tolerated treatment well    Behavior During Therapy WFL for tasks assessed/performed           Past Medical History:  Diagnosis Date  . Arthritis   . Cellulitis of right lower extremity 05/09/2014  . Diabetes mellitus without complication ([place])   . Diabetic foot ulcer ([place]) 07/07/2014   Status post bedside debridement of multiple right plantar ulcers by podiatrist, Dr. Laurena Spies.  . Hypercholesterolemia   . Maggot infestation    right foot ulcer  . Obesity 05/09/2014  . Tobacco abuse 07/07/2014    Past Surgical History:  Procedure Laterality Date  . APPENDECTOMY    . PERIPHERAL VASCULAR CATHETERIZATION N/A 07/22/2015   Procedure: Abdominal Aortogram w/Lower Extremity;  Surgeon: Elam Dutch, MD;  Location: [facility] CV LAB;  Service: Cardiovascular;  Laterality: N/A;    There were no vitals filed for this visit.   Subjective Assessment - 07/31/19 1408    Subjective  S: I am just waking up each morning and it is all stiff.    Pertinent History Pt is a 69 y/o male s/p right proximal humerus fx sustained on 02/13/19 after falling at a restaurant. Pt reports he has very limited use of his RUE. Pt was referred to occupational therapy for evaluation and treatment by Erskine Emery, PA-C.    Currently in Pain? Yes    Pain Score 3     Pain Location Shoulder    Pain Orientation Right              [facility] OT Assessment - 07/31/19 1345      Assessment   Medical Diagnosis right proximal humerus fracture    Referring Provider (OT) Erskine Emery, PA-C      Precautions   Precautions None      AROM   Overall AROM Comments Assessed seated    AROM Assessment Site Shoulder    Right/Left Shoulder Right    Right Shoulder Flexion 113 Degrees   106   Right Shoulder ABduction 93 Degrees   86   Right Shoulder Internal Rotation 90 Degrees   90   Right Shoulder External Rotation 46 Degrees   36  PROM   Overall PROM Comments Assessed supine, er/IR adducted    PROM Assessment Site Shoulder    Right/Left Shoulder Right    Right Shoulder Flexion 131 Degrees   124   Right Shoulder ABduction 94 Degrees   90   Right Shoulder Internal Rotation 90 Degrees   90   Right Shoulder External Rotation 41 Degrees   44     Strength   Overall Strength Comments Assessed seated, er/IR adducted    Strength Assessment Site Shoulder    Right/Left Shoulder Right    Right Shoulder Flexion 4/5    Right Shoulder ABduction 4/5    Right Shoulder Internal Rotation 4/5    Right Shoulder External Rotation 4/5                    OT Treatments/Exercises (OP) - 07/31/19 0001      Exercises   Exercises Shoulder      Shoulder Exercises: Supine   External Rotation PROM;10 reps    Internal Rotation PROM;10 reps      Flexion PROM;10 reps;AAROM;20 reps    ABduction PROM;10 reps      Shoulder Exercises: Standing   Protraction Strengthening;10 reps;Theraband    Theraband Level (Shoulder Protraction) Level 3 (Green)    Horizontal ABduction Strengthening;10 reps;Theraband    Theraband Level (Shoulder Horizontal ABduction) Level 3 (Green)    External Rotation Strengthening;10 reps;Theraband    Theraband Level (Shoulder External Rotation) Level 3 (Green)    Internal Rotation Strengthening;10 reps;Theraband    Theraband Level (Shoulder Internal Rotation) Level 3 (Green)      Shoulder Exercises: ROM/Strengthening   UBE (Upper Arm Bike) level 3. 2 minutes forward, 2 minutes backwards, 5 pace       Manual Therapy   Manual Therapy Myofascial release    Manual therapy comments Manual therapy completed prior to exercises.     Myofascial Release myofascial release and manual stretching completed to right upper arm, trapezius, and scapularis region to decrease fascial restrictions and increase joint mobility in a pain free zone.                     OT Short Term Goals - 07/31/19 1446      OT SHORT TERM GOAL #1   Title Pt will be provided with and educated on HEP to improve mobility required for ADL completion using RUE.    Time 4    Period Weeks    Status Achieved    Target Date 06/07/19      OT SHORT TERM GOAL #2   Title Pt will increase RUE P/ROM to [facility] to improve ability to use RUE as assist during dressing tasks.    Time 4    Period Weeks    Status Achieved      OT SHORT TERM GOAL #3   Title Pt will improve RUE strength to 3+/5 to improve ability to reach items at waist to chest height.    Time 4    Period Weeks    Status Achieved             OT Long Term Goals - 07/31/19 1447      OT LONG TERM GOAL #1   Title Pt will decrease pain in RUE to 2/10 or less to improve ability to use RUE as dominant during ADL completion.    Time 8    Period Weeks    Status Not Met      OT  LONG TERM GOAL #2   Title Pt will increase RUE A/ROM to [facility] to improve ability to reach overhead into cabinets when completing housework.    Time 8    Period Weeks    Status Achieved      OT LONG TERM GOAL #3   Title Pt will decrease RUE fascial restrictions to minimal amounts or less to improve mobility required for functional reaching tasks.    Time 8    Period Weeks    Status Partially Met      OT LONG TERM GOAL #4   Title Pt will increase RUE strength to 4/5 or greater to improve ability to perform fishing activities.    Time 8    Period Weeks    Status Achieved                 Plan - 07/31/19 1443    Clinical Impression Statement A: Reassessing and taking measurements of PROM, AROM, and strength. Pt demonsatrating progress. Conitnues to report pain at 3-5/10 daily with increased stiffness in mornings. Pt reporting he is performing exercises daily. Provided further therabanad exercises for HEP; pt demonsatrated understanding. Encouraging pt to discuss with MD the continued stiffness and pain. Pt reaching majority of goals with exception of pain. Cues provided thorughout for form.    Body Structure / Function / Physical Skills ADL;Endurance;UE functional use;Fascial restriction;Pain;ROM;IADL;Strength;Edema    Plan P: Discharge from skilled therapy.    OT Home Exercise Plan AAROM with PVC pipe; hand out provided. 7/9 shoulder stretches handout and green theraband.    Consulted and Agree with Plan of Care Patient           Patient will benefit from skilled therapeutic intervention in order to improve the following deficits and impairments:   Body Structure / Function / Physical Skills: ADL, Endurance, UE functional use, Fascial restriction, Pain, ROM, IADL, Strength, Edema       Visit Diagnosis: Acute pain of right shoulder  Stiffness of right shoulder, not elsewhere classified  Other symptoms and signs involving the musculoskeletal system    Problem  List Patient Active Problem List   Diagnosis Date Noted  . Closed fracture of proximal end of right humerus with routine healing 02/25/2019  . Pre-ulcerative calluses 04/20/2018  . 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 Diabetic foot ulcer associated with type 2 diabetes mellitus ([place]) 07/07/2014  . Tobacco abuse 07/07/2014  . Infestation, maggots 07/07/2014  . Plantar ulcer of right foot ([place]) 05/12/2014  . Pulse weakness   . Peripheral neuropathy   . Cellulitis of right lower extremity 05/09/2014  . Obesity 05/09/2014  . Cellulitis of right lower leg 05/09/2014  . Hyperglycemia 03/21/2013  . IDDM (insulin dependent diabetes mellitus) 03/21/2013  . Noncompliance with medications due to cost issues 03/21/2013    Neal Dy, MSOT, OTR/L 07/31/2019, 2:51 PM  [place], [place], [address] Phone: [phone]   Fax:  [phone]  Name: Mitchell [name] MRN: [id number] Date of Birth: 03/07/1950

## 2019-08-04 ENCOUNTER — Encounter (HOSPITAL_COMMUNITY): Payer: Medicare HMO | Admitting: Occupational Therapy

## 2019-08-06 ENCOUNTER — Encounter (HOSPITAL_COMMUNITY): Payer: Medicare HMO | Admitting: Occupational Therapy

## 2019-08-11 ENCOUNTER — Encounter (HOSPITAL_COMMUNITY): Payer: Medicare HMO | Admitting: Occupational Therapy

## 2019-08-13 ENCOUNTER — Ambulatory Visit (INDEPENDENT_AMBULATORY_CARE_PROVIDER_SITE_OTHER): Payer: Medicare HMO

## 2019-08-13 ENCOUNTER — Other Ambulatory Visit: Payer: Self-pay

## 2019-08-13 ENCOUNTER — Ambulatory Visit (INDEPENDENT_AMBULATORY_CARE_PROVIDER_SITE_OTHER): Payer: Medicare HMO | Admitting: Physician Assistant

## 2019-08-13 ENCOUNTER — Encounter: Payer: Self-pay | Admitting: Physician Assistant

## 2019-08-13 DIAGNOSIS — S42291D Other displaced fracture of upper end of right humerus, subsequent encounter for fracture with routine healing: Secondary | ICD-10-CM

## 2019-08-13 NOTE — Progress Notes (Signed)
Office Visit Note   Patient: Mitchell [name]           Date of Birth: 1950-10-16           MRN: [id number] Visit Date: 08/13/2019              Requested by: Mitchell Peri, MD [address] [place],  [place] [address] PCP: Mitchell Peri, MD   Assessment & Plan: Visit Diagnoses:  1. Other closed displaced fracture of proximal end of right humerus with routine healing, subsequent encounter     Plan: Discussed with his primary care physician he feels that his glucose levels are either under control none or could be controlled post injections.  So that he may have a intra-articular injection of the right shoulder with cortisone.  He will let us know if his primary care physician will be on board for him to have the cortisone injection under ultrasound in the right shoulder.  He will continue to work with therapy on range of motion strengthening the shoulder.  He undergoes the injection as the performed by Mitchell [name] under ultrasound.  Like to see him back 1 month after the injection.  Questions were encouraged and answered.  Follow-Up Instructions: Return 1 month after injection.   Orders:  Orders Placed This Encounter  Procedures  . XR Shoulder Right   No orders of the defined types were placed in this encounter.     Procedures: No procedures performed   Clinical Data: No additional findings.   Subjective: Chief Complaint  Patient presents with  . Right Shoulder - Follow-up    HPI Mitchell [name] returns today follow-up of his right proximal humerus fracture.  He continues to have soreness in the shoulder with overhead activity particularly in the morning.  He has better range of motion still has problems with internal rotation.  He is asking if massage therapy could help with the shoulder.  He is taking ibuprofen for pain.  He reports that his glucose levels this morning 139 however his last hemoglobin A1c was 8.0 per patient.  Review of Systems See HPI otherwise  negative  Objective: Vital Signs: There were no vitals taken for this visit.  Physical Exam General: Well-developed well-nourished male no acute distress mood and affect appropriate. Ortho Exam Right shoulder forward flexion actively to 120 range 240 degrees passively.  Abduction 90 degrees passive and active.  5 out of 5 strength with external and internal rotation against resistance bilateral shoulders.  Fluid motion of the shoulder passively without significant pain.  Specialty Comments:  No specialty comments available.  Imaging: XR Shoulder Right  Result Date: 08/13/2019 Right shoulder 2 views: Shoulders well located.  The proximal humerus fracture is well-healed.  There is no significant arthritic changes.    PMFS History: Patient Active Problem List   Diagnosis Date Noted  . Closed fracture of proximal end of right humerus with routine healing 02/25/2019  . Pre-ulcerative calluses 04/20/2018  . 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 Diabetic foot ulcer associated with type 2 diabetes mellitus (HCC) 07/07/2014  . Tobacco abuse 07/07/2014  . Infestation, maggots 07/07/2014  . Plantar ulcer of right foot (HCC) 05/12/2014  . Pulse weakness   . Peripheral neuropathy   . Cellulitis of right lower extremity 05/09/2014  . Obesity 05/09/2014  . Cellulitis of right lower leg 05/09/2014  . Hyperglycemia 03/21/2013  . IDDM (insulin dependent diabetes mellitus) 03/21/2013  . Noncompliance with medications due to cost issues 03/21/2013   Past Medical History:  Diagnosis Date  . Arthritis   . Cellulitis of right lower extremity 05/09/2014  . Diabetes mellitus without complication (HCC)   . Diabetic foot ulcer (HCC) 07/07/2014   Status post bedside debridement of multiple right plantar ulcers by podiatrist, Dr. Reynolds [name].  . Hypercholesterolemia   . Maggot infestation    right foot ulcer  . Obesity 05/09/2014  . Tobacco abuse 07/07/2014    History reviewed. No pertinent family history.  Past  Surgical History:  Procedure Laterality Date  . APPENDECTOMY    . PERIPHERAL VASCULAR CATHETERIZATION N/A 07/22/2015   Procedure: Abdominal Aortogram w/Lower Extremity;  Surgeon: Mitchell Kerns, MD;  Location: [facility] INVASIVE CV LAB;  Service: Cardiovascular;  Laterality: N/A;   Social History   Occupational History  . Not on file  Tobacco Use  . Smoking status: Current Every Day Smoker    Packs/day: 1.50    Types: Cigarettes  . Smokeless tobacco: Never Used  Substance and Sexual Activity  . Alcohol use: Yes    Alcohol/week: 0.0 standard drinks    Comment: occ-twice a week beer approx 2  . Drug use: No  . Sexual activity: Not on file

## 2019-08-14 ENCOUNTER — Encounter (HOSPITAL_COMMUNITY): Payer: Medicare HMO | Admitting: Occupational Therapy

## 2019-08-14 DIAGNOSIS — E119 Type 2 diabetes mellitus without complications: Secondary | ICD-10-CM | POA: Diagnosis not present

## 2019-09-15 DIAGNOSIS — E119 Type 2 diabetes mellitus without complications: Secondary | ICD-10-CM | POA: Diagnosis not present

## 2019-10-15 DIAGNOSIS — E119 Type 2 diabetes mellitus without complications: Secondary | ICD-10-CM | POA: Diagnosis not present

## 2019-11-14 DIAGNOSIS — E119 Type 2 diabetes mellitus without complications: Secondary | ICD-10-CM | POA: Diagnosis not present

## 2019-12-15 DIAGNOSIS — E119 Type 2 diabetes mellitus without complications: Secondary | ICD-10-CM | POA: Diagnosis not present

## 2020-01-11 ENCOUNTER — Other Ambulatory Visit: Payer: Self-pay | Admitting: Podiatry

## 2020-01-11 DIAGNOSIS — M79661 Pain in right lower leg: Secondary | ICD-10-CM

## 2020-01-11 DIAGNOSIS — L539 Erythematous condition, unspecified: Secondary | ICD-10-CM

## 2020-01-11 DIAGNOSIS — R609 Edema, unspecified: Secondary | ICD-10-CM

## 2020-01-14 DIAGNOSIS — E119 Type 2 diabetes mellitus without complications: Secondary | ICD-10-CM | POA: Diagnosis not present

## 2020-02-15 DIAGNOSIS — E119 Type 2 diabetes mellitus without complications: Secondary | ICD-10-CM | POA: Diagnosis not present

## 2020-02-29 DIAGNOSIS — R69 Illness, unspecified: Secondary | ICD-10-CM | POA: Diagnosis not present

## 2020-02-29 DIAGNOSIS — Z6838 Body mass index (BMI) 38.0-38.9, adult: Secondary | ICD-10-CM | POA: Diagnosis not present

## 2020-02-29 DIAGNOSIS — E1142 Type 2 diabetes mellitus with diabetic polyneuropathy: Secondary | ICD-10-CM | POA: Diagnosis not present

## 2020-02-29 DIAGNOSIS — Z794 Long term (current) use of insulin: Secondary | ICD-10-CM | POA: Diagnosis not present

## 2020-02-29 DIAGNOSIS — F17210 Nicotine dependence, cigarettes, uncomplicated: Secondary | ICD-10-CM | POA: Diagnosis not present

## 2020-02-29 DIAGNOSIS — Z299 Encounter for prophylactic measures, unspecified: Secondary | ICD-10-CM | POA: Diagnosis not present

## 2020-02-29 DIAGNOSIS — E7800 Pure hypercholesterolemia, unspecified: Secondary | ICD-10-CM | POA: Diagnosis not present

## 2020-02-29 DIAGNOSIS — E1165 Type 2 diabetes mellitus with hyperglycemia: Secondary | ICD-10-CM | POA: Diagnosis not present

## 2020-03-14 DIAGNOSIS — E119 Type 2 diabetes mellitus without complications: Secondary | ICD-10-CM | POA: Diagnosis not present

## 2020-03-24 DIAGNOSIS — E1129 Type 2 diabetes mellitus with other diabetic kidney complication: Secondary | ICD-10-CM | POA: Diagnosis not present

## 2020-03-25 DIAGNOSIS — E1142 Type 2 diabetes mellitus with diabetic polyneuropathy: Secondary | ICD-10-CM | POA: Diagnosis not present

## 2020-03-25 DIAGNOSIS — E1151 Type 2 diabetes mellitus with diabetic peripheral angiopathy without gangrene: Secondary | ICD-10-CM | POA: Diagnosis not present

## 2020-03-25 DIAGNOSIS — E785 Hyperlipidemia, unspecified: Secondary | ICD-10-CM | POA: Diagnosis not present

## 2020-03-25 DIAGNOSIS — Z794 Long term (current) use of insulin: Secondary | ICD-10-CM | POA: Diagnosis not present

## 2020-03-25 DIAGNOSIS — G8929 Other chronic pain: Secondary | ICD-10-CM | POA: Diagnosis not present

## 2020-03-25 DIAGNOSIS — I10 Essential (primary) hypertension: Secondary | ICD-10-CM | POA: Diagnosis not present

## 2020-03-25 DIAGNOSIS — E1165 Type 2 diabetes mellitus with hyperglycemia: Secondary | ICD-10-CM | POA: Diagnosis not present

## 2020-03-25 DIAGNOSIS — R69 Illness, unspecified: Secondary | ICD-10-CM | POA: Diagnosis not present

## 2020-03-28 DIAGNOSIS — Z299 Encounter for prophylactic measures, unspecified: Secondary | ICD-10-CM | POA: Diagnosis not present

## 2020-03-28 DIAGNOSIS — Z6841 Body Mass Index (BMI) 40.0 and over, adult: Secondary | ICD-10-CM | POA: Diagnosis not present

## 2020-03-28 DIAGNOSIS — R69 Illness, unspecified: Secondary | ICD-10-CM | POA: Diagnosis not present

## 2020-03-28 DIAGNOSIS — E1165 Type 2 diabetes mellitus with hyperglycemia: Secondary | ICD-10-CM | POA: Diagnosis not present

## 2020-03-28 DIAGNOSIS — E1142 Type 2 diabetes mellitus with diabetic polyneuropathy: Secondary | ICD-10-CM | POA: Diagnosis not present

## 2020-03-28 DIAGNOSIS — F17210 Nicotine dependence, cigarettes, uncomplicated: Secondary | ICD-10-CM | POA: Diagnosis not present

## 2020-03-28 DIAGNOSIS — Z794 Long term (current) use of insulin: Secondary | ICD-10-CM | POA: Diagnosis not present

## 2020-04-09 DIAGNOSIS — E1129 Type 2 diabetes mellitus with other diabetic kidney complication: Secondary | ICD-10-CM | POA: Diagnosis not present

## 2020-04-14 DIAGNOSIS — E119 Type 2 diabetes mellitus without complications: Secondary | ICD-10-CM | POA: Diagnosis not present

## 2020-04-19 DIAGNOSIS — R69 Illness, unspecified: Secondary | ICD-10-CM | POA: Diagnosis not present

## 2020-04-19 DIAGNOSIS — Z299 Encounter for prophylactic measures, unspecified: Secondary | ICD-10-CM | POA: Diagnosis not present

## 2020-04-19 DIAGNOSIS — L309 Dermatitis, unspecified: Secondary | ICD-10-CM | POA: Diagnosis not present

## 2020-04-19 DIAGNOSIS — F17210 Nicotine dependence, cigarettes, uncomplicated: Secondary | ICD-10-CM | POA: Diagnosis not present

## 2020-04-19 DIAGNOSIS — Z6841 Body Mass Index (BMI) 40.0 and over, adult: Secondary | ICD-10-CM | POA: Diagnosis not present

## 2020-04-19 DIAGNOSIS — E1142 Type 2 diabetes mellitus with diabetic polyneuropathy: Secondary | ICD-10-CM | POA: Diagnosis not present

## 2020-04-19 DIAGNOSIS — E1165 Type 2 diabetes mellitus with hyperglycemia: Secondary | ICD-10-CM | POA: Diagnosis not present

## 2020-04-19 DIAGNOSIS — I10 Essential (primary) hypertension: Secondary | ICD-10-CM | POA: Diagnosis not present

## 2020-05-02 DIAGNOSIS — E1129 Type 2 diabetes mellitus with other diabetic kidney complication: Secondary | ICD-10-CM | POA: Diagnosis not present

## 2020-05-14 DIAGNOSIS — E119 Type 2 diabetes mellitus without complications: Secondary | ICD-10-CM | POA: Diagnosis not present

## 2020-05-30 DIAGNOSIS — Z0100 Encounter for examination of eyes and vision without abnormal findings: Secondary | ICD-10-CM | POA: Diagnosis not present

## 2020-05-30 DIAGNOSIS — H5200 Hypermetropia, unspecified eye: Secondary | ICD-10-CM | POA: Diagnosis not present

## 2020-06-02 DIAGNOSIS — E119 Type 2 diabetes mellitus without complications: Secondary | ICD-10-CM | POA: Diagnosis not present

## 2020-06-03 DIAGNOSIS — R69 Illness, unspecified: Secondary | ICD-10-CM | POA: Diagnosis not present

## 2020-06-03 DIAGNOSIS — R030 Elevated blood-pressure reading, without diagnosis of hypertension: Secondary | ICD-10-CM | POA: Diagnosis not present

## 2020-06-03 DIAGNOSIS — F17210 Nicotine dependence, cigarettes, uncomplicated: Secondary | ICD-10-CM | POA: Diagnosis not present

## 2020-06-03 DIAGNOSIS — I10 Essential (primary) hypertension: Secondary | ICD-10-CM | POA: Diagnosis not present

## 2020-06-03 DIAGNOSIS — R609 Edema, unspecified: Secondary | ICD-10-CM | POA: Diagnosis not present

## 2020-06-03 DIAGNOSIS — N5089 Other specified disorders of the male genital organs: Secondary | ICD-10-CM | POA: Diagnosis not present

## 2020-06-03 DIAGNOSIS — Z299 Encounter for prophylactic measures, unspecified: Secondary | ICD-10-CM | POA: Diagnosis not present

## 2020-06-03 DIAGNOSIS — Z6841 Body Mass Index (BMI) 40.0 and over, adult: Secondary | ICD-10-CM | POA: Diagnosis not present

## 2020-06-03 DIAGNOSIS — E1165 Type 2 diabetes mellitus with hyperglycemia: Secondary | ICD-10-CM | POA: Diagnosis not present

## 2020-06-20 ENCOUNTER — Ambulatory Visit (HOSPITAL_COMMUNITY): Payer: Medicare HMO | Attending: Internal Medicine | Admitting: Physical Therapy

## 2020-06-20 ENCOUNTER — Other Ambulatory Visit: Payer: Self-pay

## 2020-06-20 ENCOUNTER — Encounter (HOSPITAL_COMMUNITY): Payer: Self-pay | Admitting: Physical Therapy

## 2020-06-20 DIAGNOSIS — M79662 Pain in left lower leg: Secondary | ICD-10-CM | POA: Diagnosis not present

## 2020-06-20 DIAGNOSIS — M79661 Pain in right lower leg: Secondary | ICD-10-CM | POA: Diagnosis not present

## 2020-06-20 DIAGNOSIS — S81802S Unspecified open wound, left lower leg, sequela: Secondary | ICD-10-CM | POA: Diagnosis not present

## 2020-06-20 DIAGNOSIS — S81801A Unspecified open wound, right lower leg, initial encounter: Secondary | ICD-10-CM

## 2020-06-20 NOTE — Therapy (Signed)
[facility] [address] [place], [place], [address] Phone: [phone]   Fax:  [phone]  Wound Care Evaluation  Patient Details  Name: Mitchell [name] MRN: [id number] Date of Birth: 1950/02/05 Referring Provider (PT): Kirstie PeriShah Ashish   Encounter Date: 06/20/2020   PT End of Session - 06/20/20 1501    Visit Number 1    Number of Visits 8    Date for PT Re-Evaluation 07/20/20    Authorization Type [employer] medicare -no visit limit or auth    Progress Note Due on Visit 8    PT Start Time 1325   pt late for appointment   PT Stop Time 1430    PT Time Calculation (min) 65 min    Activity Tolerance Patient tolerated treatment well    Behavior [facility] Therapy [facility] for tasks assessed/performed           Past Medical History:  Diagnosis Date  . Arthritis   . Cellulitis of right lower extremity 05/09/2014  . Diabetes mellitus without complication (HCC)   . Diabetic foot ulcer (HCC) 07/07/2014   Status post bedside debridement of multiple right plantar ulcers by podiatrist, Dr. Reynolds BowlLu.  . Hypercholesterolemia   . Maggot infestation    right foot ulcer  . Obesity 05/09/2014  . Tobacco abuse 07/07/2014    Past Surgical History:  Procedure Laterality Date  . APPENDECTOMY    . PERIPHERAL VASCULAR CATHETERIZATION N/A 07/22/2015   Procedure: Abdominal Aortogram w/Lower Extremity;  Surgeon: Sherren Kernsharles E Fields, MD;  [facility];  Service: Cardiovascular;  Laterality: N/A;    There were no vitals filed for this visit.     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  Yes    Is the patient reluctant to leave their home because of a fear of falling?  No      Home Environment   Living Environment Private residence      Prior Function   Level of Independence Independent with basic ADLs      Cognition   Overall Cognitive Status Within Functional Limits for tasks assessed           Wound Therapy - 06/20/20 0001    Subjective Mitchell [name] states that his legs have been weeping for about a week.  He has not gotten compression as it is to difficult for him to get on.  He states that his legs feel better when they are down despite the swelling    Patient and Family Stated Goals wounds to heal, weeping to stop    Date of Onset 06/14/20    Prior Treatments self care    Pain Scale 0-10    Pain Score 3     Pain Type Acute pain    Pain Location Leg  Pain Orientation Right;Left    Pain Descriptors / Indicators Discomfort    Patients Stated Pain Goal 0    Evaluation and Treatment Procedures agreed to    Wound Properties Date First Assessed: 06/20/20 Time First Assessed: 1325 Wound Type: Venous stasis ulcer Location: Pretibial Location Orientation: Right Present on Admission: Yes   Wound Image View All Images View Images    Dressing Type None    Dressing Change Frequency PRN    Site / Wound Assessment Dry    % Wound base Red or Granulating 100%   10 + scattered open wounds.   Peri-wound Assessment Intact;Induration    Drainage Amount None    Treatment Cleansed;Other (Comment)    Wound Properties Date First Assessed: 06/20/20 Time First Assessed: 1330 Location: Pretibial Location Orientation: Left Wound Description (Comments): multiple small scabs on the front with one large scab on the back   Wound Image View All Images View Images    Dressing Type None    Dressing Change Frequency PRN    Site / Wound Assessment Dry;Dusky    %  Wound base Red or Granulating 0%   8+ scattered scabs.   % Wound base Yellow/Fibrinous Exudate 100%    Peri-wound Assessment Intact;Induration    Drainage Amount None    Treatment Cleansed    Wound Therapy - Clinical Statement Mitchell [name] has been referred to this clinic for weeping wounds with swelling in B LE .  Pt was a pt at this clinic for wound carea for 30 from 1/6/thru 4/1 20021.   At dischage he was urged to wear compression garments which he has not been doing due to the difficulty of donning them.  He states that the wounds appeared about a week ago and were weeping .  At this time the weeping has stopped but the wounds remain.  The pt notes that his legs actually feel better down than raised despite the swelling.   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 Both LE were cleansed and moisturized followed by light compression with profore lite with instructions that his leg pain should not increase at all and if it does he should take the bandages off immediately.  The therapist then educated pt on the use of a Buter and had the pt practice doning and doffing a compression garment.    Wound Therapy - Functional Problem List difficulty in dressing , nonhealing wounds    Factors Delaying/Impairing Wound Healing Altered sensation;Diabetes Mellitus;Multiple medical problems;Tobacco use;Vascular compromise    Hydrotherapy Plan Dressing change;Debridement;Patient/family education    Wound Therapy - Frequency 2X / week   x4 weeks   Wound Therapy - Current Recommendations PT    Wound Therapy - Follow Up Recommendations --   op Pt   Wound Plan cleans, debride and lite compression until all wounds are healed then [facility] due to possible arterial component pt may do better with 15-20 vx 20-30.    Dressing  profore lite B                 Objective measurements completed on examination: See above  findings.             PT Education - 06/20/20 1500    Education Details donning of compression garments, the importance of removing bandage if pain increases.    Person(s) Educated Patient    Methods Explanation;Demonstration    Comprehension Verbalized understanding;Returned  demonstration            PT Short Term Goals - 06/20/20 1506      PT SHORT TERM GOAL #1   Title Less than five wounds on Rt LE as well as LE LE    Time 2    Period Weeks    Target Date 07/04/20      PT SHORT TERM GOAL #2   Title Wounds to be 100% granulated to allow healing    Time 2    Period Weeks    Status New      PT SHORT TERM GOAL #3   Title LE to no longer be indurated to allow improved healing    Time 2    Period Weeks    Status New             PT Long Term Goals - 06/20/20 1507      PT LONG TERM GOAL #1   Title PT to have no pain in his legs.    Time 4    Period Weeks    Status New      PT LONG TERM GOAL #2   Title Wounds to be healed    Time 4    Period Weeks    Status New      PT LONG TERM GOAL #3   Title PT to be able to demonstrate the ability to don compression garment on to prevent future wounds    Time 4    Period Weeks    Status New                Plan - 06/20/20 1503    Clinical Impression Statement see above    Personal Factors and Comorbidities Comorbidity 3+    Comorbidities smoker. DM, vascular compromise    Examination-Activity Limitations Bathing;Dressing;Locomotion Level    Examination-Participation Restrictions Yard Work;Shop    Stability/Clinical Decision Making Stable/Uncomplicated    Clinical Decision Making Moderate    Rehab Potential Good    PT Frequency 2x / week    PT Duration 4 weeks    PT Treatment/Interventions Other (comment)   cleans, moisturize, debridement and dressing change   PT Next Visit Plan assess wound and comfort of bandaging.   As soon as wounds are healed refer for compression garment .           Patient  will benefit from skilled therapeutic intervention in order to improve the following deficits and impairments:  Pain,Other (comment),Increased edema (non healing wounds.)  Visit Diagnosis: Pain in left lower leg  Pain in right lower leg  Multiple open wounds of right lower leg  Multiple open wounds of lower leg, left, sequela    Problem List Patient Active Problem List   Diagnosis Date Noted  . Closed fracture of proximal end of right humerus with routine healing 02/25/2019  . Pre-ulcerative calluses 04/20/2018  . 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 Diabetic foot ulcer associated with type 2 diabetes mellitus (HCC) 07/07/2014  . Tobacco abuse 07/07/2014  . Infestation, maggots 07/07/2014  . Plantar ulcer of right foot (HCC) 05/12/2014  . Pulse weakness   . Peripheral neuropathy   . Cellulitis of right lower extremity 05/09/2014  . Obesity 05/09/2014  . Cellulitis of right lower leg 05/09/2014  . Hyperglycemia 03/21/2013  . IDDM (insulin dependent diabetes mellitus) 03/21/2013  . Noncompliance with medications due to cost issues 03/21/2013    Virgina Organ, PT CLT [phone] 06/20/2020, 3:13 PM  [place] [facility]  [facility] [address] [place], [place], [address] Phone: [phone]   Fax:  [phone]  Name: Mitchell [name] MRN: [id number] Date of Birth: 05-31-1950

## 2020-06-22 ENCOUNTER — Ambulatory Visit (HOSPITAL_COMMUNITY): Payer: Medicare HMO | Admitting: Physical Therapy

## 2020-06-24 ENCOUNTER — Other Ambulatory Visit: Payer: Self-pay

## 2020-06-24 ENCOUNTER — Ambulatory Visit (HOSPITAL_COMMUNITY): Payer: Medicare HMO

## 2020-06-24 ENCOUNTER — Encounter (HOSPITAL_COMMUNITY): Payer: Self-pay

## 2020-06-24 DIAGNOSIS — S81801A Unspecified open wound, right lower leg, initial encounter: Secondary | ICD-10-CM | POA: Diagnosis not present

## 2020-06-24 DIAGNOSIS — M79661 Pain in right lower leg: Secondary | ICD-10-CM | POA: Diagnosis not present

## 2020-06-24 DIAGNOSIS — S81802S Unspecified open wound, left lower leg, sequela: Secondary | ICD-10-CM

## 2020-06-24 DIAGNOSIS — M79662 Pain in left lower leg: Secondary | ICD-10-CM | POA: Diagnosis not present

## 2020-06-24 NOTE — Therapy (Signed)
[facility] [address] [place], [place], [address] Phone: [phone]   Fax:  [phone]  Physical Therapy Treatment  Patient Details  Name: Mitchell [name] MRN: [id number] Date of Birth: [birth year] Referring Provider (PT): Kirstie Peri   Encounter Date: 06/24/2020   PT End of Session - 06/24/20 1809     Visit Number 2    Number of Visits 8    Date for PT Re-Evaluation 07/20/20    Authorization Type [employer] medicare -no visit limit or auth    Progress Note Due on Visit 8    PT Start Time 1705    PT Stop Time 1745    PT Time Calculation (min) 40 min    Activity Tolerance Patient tolerated treatment well    Behavior During Therapy [facility] for tasks assessed/performed             Past Medical History:  Diagnosis Date   Arthritis    Cellulitis of right lower extremity 05/09/2014   Diabetes mellitus without complication (HCC)    Diabetic foot ulcer (HCC) 07/07/2014   Status post bedside debridement of multiple right plantar ulcers by podiatrist, Dr. Reynolds Bowl.   Hypercholesterolemia    Maggot infestation    right foot ulcer   Obesity 05/09/2014   Tobacco abuse 07/07/2014    Past Surgical History:  Procedure Laterality Date   APPENDECTOMY     PERIPHERAL VASCULAR CATHETERIZATION N/A 07/22/2015   Procedure: Abdominal Aortogram w/Lower Extremity;  Surgeon: Sherren Kerns, MD;  Location: [facility] INVASIVE CV LAB;  Service: Cardiovascular;  Laterality: N/A;    There were no vitals filed for this visit.   Subjective Assessment - 06/24/20 1801     Subjective Pt arrived with dressings intact though have slid down.  No reoprts of pain, just some discomfort with dressings sliding down.    Currently in Pain? No/denies                             Wound Therapy - 06/24/20 0001     Subjective Pt arrived with dressings intact though have slid down.  No reoprts of pain, just some discomfort with dressings sliding down.    Patient and  Family Stated Goals wounds to heal, weeping to stop    Date of Onset 06/14/20    Prior Treatments self care    Pain Scale 0-10    Pain Score 0-No pain    Evaluation and Treatment Procedures Explained to Patient/Family Yes    Evaluation and Treatment Procedures agreed to    Wound Properties Date First Assessed: 06/20/20 Time First Assessed: 1325 Wound Type: Venous stasis ulcer Location: Pretibial Location Orientation: Right Present on Admission: Yes   Dressing Type Compression wrap   vaseline/lotion   Dressing Changed Changed    Dressing Status Old drainage    Dressing Change Frequency PRN    Site / Wound Assessment Dry    % Wound base Red or Granulating 100%   10+ scattered open wounds   Peri-wound Assessment Intact;Induration    Drainage Amount None    Treatment Cleansed;Debridement (Selective)    Wound Properties Date First Assessed: 06/20/20 Time First Assessed: 1330 Location: Pretibial Location Orientation: Left Wound Description (Comments): multiple small scabs on the front with one large scab on the back   Dressing Type Compression wrap   vaseline/lotion   Dressing Changed Changed    Dressing Status Old drainage    Dressing Change  Frequency PRN    Site / Wound Assessment Dry    % Wound base Red or Granulating 50%   8+ scattered open wounds   % Wound base Yellow/Fibrinous Exudate 50%    Peri-wound Assessment Intact;Induration    Treatment Cleansed;Debridement (Selective)    Selective Debridement - Location BLE anterior, Lt posterior    Selective Debridement - Tools Used Forceps    Selective Debridement - Tissue Removed dry skin, scab    Wound Therapy - Clinical Statement Pt dressings have slid down with bottle neck superior.  Selective debridement for removal of dry skin and large posterior scab.  Noted wheeping from Lt superior wound that was exposed due to dressings sliding down.  COntinued wiht profore lite with additonal ABD pad and cotton for cone shape to hopefully reduce  sliding down.  Pt may need foam next session if continues to slide down.  Plan to measure and give handout next session for compression garment.  Did not complete this session as bottleneck that was reduced with short duration decongesitve massage.    Wound Therapy - Functional Problem List difficulty in dressing , nonhealing wounds    Factors Delaying/Impairing Wound Healing Altered sensation;Diabetes Mellitus;Multiple medical problems;Tobacco use;Vascular compromise    Hydrotherapy Plan Dressing change;Debridement;Patient/family education    Wound Therapy - Frequency 2X / week   4 weeks   Wound Therapy - Current Recommendations PT    Wound Therapy - Follow Up Recommendations --   op PT   Wound Plan cleans, debride and lite compression until all wounds are healed then [facility] or [facility] due to possible arterial component pt may do better with 15-20 vx 20-30.    Dressing  vaseline/lotion, profore lite B wiht additonal ABD pad and cotton for cone shape                        PT Short Term Goals - 06/20/20 1506       PT SHORT TERM GOAL #1   Title Less than five wounds on Rt LE as well as LE LE    Time 2    Period Weeks    Target Date 07/04/20      PT SHORT TERM GOAL #2   Title Wounds to be 100% granulated to allow healing    Time 2    Period Weeks    Status New      PT SHORT TERM GOAL #3   Title LE to no longer be indurated to allow improved healing    Time 2    Period Weeks    Status New               PT Long Term Goals - 06/20/20 1507       PT LONG TERM GOAL #1   Title PT to have no pain in his legs.    Time 4    Period Weeks    Status New      PT LONG TERM GOAL #2   Title Wounds to be healed    Time 4    Period Weeks    Status New      PT LONG TERM GOAL #3   Title PT to be able to demonstrate the ability to don compression garment on to prevent future wounds    Time 4    Period Weeks     Status New  Patient will benefit from skilled therapeutic intervention in order to improve the following deficits and impairments:     Visit Diagnosis: Pain in left lower leg  Pain in right lower leg  Multiple open wounds of right lower leg  Multiple open wounds of lower leg, left, sequela     Problem List Patient Active Problem List   Diagnosis Date Noted   Closed fracture of proximal end of right humerus with routine healing 02/25/2019   Pre-ulcerative calluses 04/20/2018   Myositis 07/08/2014   Diabetic foot ulcer associated with type 2 diabetes mellitus (HCC) 07/07/2014   Tobacco abuse 07/07/2014   Infestation, maggots 07/07/2014   Plantar ulcer of right foot (HCC) 05/12/2014   Pulse weakness    Peripheral neuropathy    Cellulitis of right lower extremity 05/09/2014   Obesity 05/09/2014   Cellulitis of right lower leg 05/09/2014   Hyperglycemia 03/21/2013   IDDM (insulin dependent diabetes mellitus) 03/21/2013   Noncompliance with medications due to cost issues 03/21/2013   Becky Sax, LPTA/CLT; CBIS [phone]  Juel Burrow 06/24/2020, 6:10 PM  [facility] [address] [place], [place], [address] Phone: [phone]   Fax:  [phone]  Name: Mitchell [name] MRN: [id number] Date of Birth: [birth year]

## 2020-06-27 ENCOUNTER — Other Ambulatory Visit: Payer: Self-pay

## 2020-06-27 ENCOUNTER — Ambulatory Visit (HOSPITAL_COMMUNITY): Payer: Medicare HMO | Admitting: Physical Therapy

## 2020-06-27 ENCOUNTER — Encounter (HOSPITAL_COMMUNITY): Payer: Self-pay | Admitting: Physical Therapy

## 2020-06-27 DIAGNOSIS — M79661 Pain in right lower leg: Secondary | ICD-10-CM | POA: Diagnosis not present

## 2020-06-27 DIAGNOSIS — S81802S Unspecified open wound, left lower leg, sequela: Secondary | ICD-10-CM | POA: Diagnosis not present

## 2020-06-27 DIAGNOSIS — M79662 Pain in left lower leg: Secondary | ICD-10-CM | POA: Diagnosis not present

## 2020-06-27 DIAGNOSIS — S81801A Unspecified open wound, right lower leg, initial encounter: Secondary | ICD-10-CM

## 2020-06-27 NOTE — Therapy (Signed)
[facility] [address] [place], [place], [address] Phone: [phone]   Fax:  [phone]  Wound Care Therapy  Patient Details  Name: Mitchell [name] MRN: [id number] Date of Birth: [birth year] Referring Provider (PT): Kirstie Peri   Encounter Date: 06/27/2020   PT End of Session - 06/27/20 1624     Visit Number 3    Number of Visits 8    Date for PT Re-Evaluation 07/20/20    Authorization Type [employer] medicare -no visit limit or auth    Progress Note Due on Visit 8    PT Start Time 1530    PT Stop Time 1625    PT Time Calculation (min) 55 min    Activity Tolerance Patient tolerated treatment well    Behavior During Therapy [facility] for tasks assessed/performed             Past Medical History:  Diagnosis Date   Arthritis    Cellulitis of right lower extremity 05/09/2014   Diabetes mellitus without complication (HCC)    Diabetic foot ulcer (HCC) 07/07/2014   Status post bedside debridement of multiple right plantar ulcers by podiatrist, Dr. Reynolds Bowl.   Hypercholesterolemia    Maggot infestation    right foot ulcer   Obesity 05/09/2014   Tobacco abuse 07/07/2014    Past Surgical History:  Procedure Laterality Date   APPENDECTOMY     PERIPHERAL VASCULAR CATHETERIZATION N/A 07/22/2015   Procedure: Abdominal Aortogram w/Lower Extremity;  Surgeon: Sherren Kerns, MD;  Location: [facility] INVASIVE CV LAB;  Service: Cardiovascular;  Laterality: N/A;    There were no vitals filed for this visit.      [facility] PT Assessment - 06/27/20 0001       Assessment   Medical Diagnosis non healing wounds B LE    Referring Provider (PT) Kirstie Peri    Onset Date/Surgical Date 06/14/20                     Wound Therapy - 06/27/20 0001     Subjective Reports no difficulties since last session, garments did not slide down as much    Patient and Family Stated Goals wounds to heal, weeping to stop    Date of Onset 06/14/20    Prior Treatments self  care    Pain Score 0-No pain    Evaluation and Treatment Procedures Explained to Patient/Family Yes    Evaluation and Treatment Procedures agreed to    Wound Properties Date First Assessed: 06/20/20 Time First Assessed: 1325 Wound Type: Venous stasis ulcer Location: Pretibial Location Orientation: Right Present on Admission: Yes   Dressing Type Compression wrap    Dressing Changed Changed    Dressing Status Old drainage    Dressing Change Frequency PRN    Site / Wound Assessment Dry    % Wound base Red or Granulating 100%   4+ wounds scattered throughout leg   Peri-wound Assessment Intact;Edema   indurated   Drainage Amount Scant    Drainage Description Serosanguineous    Treatment Cleansed;Debridement (Selective)    Wound Properties Date First Assessed: 06/20/20 Time First Assessed: 1330 Location: Pretibial Location Orientation: Left Wound Description (Comments): multiple small scabs on the front with one large scab on the back   Dressing Type Compression wrap    Dressing Changed Changed    Dressing Status Old drainage    Dressing Change Frequency PRN    Site / Wound Assessment Dry    % Wound  base Red or Granulating 100%   6+ wounds on left leg   Peri-wound Assessment Intact;Edema   indurated   Drainage Amount Scant    Drainage Description Serosanguineous    Treatment Cleansed;Debridement (Selective)    Selective Debridement - Location BLE anterior, Lt posterior    Selective Debridement - Tools Used Forceps    Selective Debridement - Tissue Removed dry skin, scab    Wound Therapy - Clinical Statement Patient with reduced number of wounds on both legs and improved granulation tissue. Discussed gettign butler but patietn with financial limitiaotns, will look into getting butler at beginning of next month. Instructed patient to bring in compression garments to practice using butler prior to buying it. Will continue with current POC as tolerated.    Wound Therapy - Functional Problem List  difficulty in dressing , nonhealing wounds    Factors Delaying/Impairing Wound Healing Altered sensation;Diabetes Mellitus;Multiple medical problems;Tobacco use;Vascular compromise    Hydrotherapy Plan Dressing change;Debridement;Patient/family education    Wound Therapy - Frequency 2X / week   4 weeks   Wound Therapy - Current Recommendations PT    Wound Therapy - Follow Up Recommendations --   op PT   Wound Plan cleans, debride and lite compression until all wounds are healed then [facility] or [facility] fitting due to possible arterial component pt may do better with 15-20 vx 20-30.    Dressing  vaseline/lotion, profore lite B wiht additonal ABD pad and cotton for cone shape                     PT Education - 06/27/20 1625     Education Details about size of butler to get, how it will help, to bring in compressin garments in next sesion    Person(s) Educated Patient    Methods Explanation    Comprehension Verbalized understanding              PT Short Term Goals - 06/20/20 1506       PT SHORT TERM GOAL #1   Title Less than five wounds on Rt LE as well as LE LE    Time 2    Period Weeks    Target Date 07/04/20      PT SHORT TERM GOAL #2   Title Wounds to be 100% granulated to allow healing    Time 2    Period Weeks    Status New      PT SHORT TERM GOAL #3   Title LE to no longer be indurated to allow improved healing    Time 2    Period Weeks    Status New               PT Long Term Goals - 06/20/20 1507       PT LONG TERM GOAL #1   Title PT to have no pain in his legs.    Time 4    Period Weeks    Status New      PT LONG TERM GOAL #2   Title Wounds to be healed    Time 4    Period Weeks    Status New      PT LONG TERM GOAL #3   Title PT to be able to demonstrate the ability to don compression garment on to prevent future wounds    Time 4    Period Weeks    Status New  Plan - 06/27/20 1624     Clinical Impression Statement see above    Personal Factors and Comorbidities Comorbidity 3+    Comorbidities smoker. DM, vascular compromise    Examination-Activity Limitations Bathing;Dressing;Locomotion Level    Examination-Participation Restrictions Yard Work;Shop    Stability/Clinical Decision Making Stable/Uncomplicated    Rehab Potential Good    PT Frequency 2x / week    PT Duration 4 weeks    PT Treatment/Interventions Other (comment)   cleans, moisturize, debridement and dressing change   PT Next Visit Plan assess wound and comfort of bandaging.   As soon as wounds are healed refer for compression garment .             Patient will benefit from skilled therapeutic intervention in order to improve the following deficits and impairments:  Pain, Other (comment), Increased edema (non healing wounds.)  Visit Diagnosis: Pain in left lower leg  Multiple open wounds of right lower leg  Pain in right lower leg  Multiple open wounds of lower leg, left, sequela     Problem List Patient Active Problem List   Diagnosis Date Noted   Closed fracture of proximal end of right humerus with routine healing 02/25/2019   Pre-ulcerative calluses 04/20/2018   Myositis 07/08/2014   Diabetic foot ulcer associated with type 2 diabetes mellitus (HCC) 07/07/2014   Tobacco abuse 07/07/2014   Infestation, maggots 07/07/2014   Plantar ulcer of right foot (HCC) 05/12/2014   Pulse weakness    Peripheral neuropathy    Cellulitis of right lower extremity 05/09/2014   Obesity 05/09/2014   Cellulitis of right lower leg 05/09/2014   Hyperglycemia 03/21/2013   IDDM (insulin dependent diabetes mellitus) 03/21/2013   Noncompliance with medications due to cost issues 03/21/2013   4:30 PM, 06/27/20 Tereasa Coop, DPT Physical Therapy with [facility]  [phone] office   [facility] [address] [place], [place], [address] Phone: [phone]   Fax:  [phone]  Name: Mitchell [name] MRN: [id number] Date of Birth: 1950-06-29

## 2020-06-29 ENCOUNTER — Ambulatory Visit (HOSPITAL_COMMUNITY): Payer: Medicare HMO | Admitting: Physical Therapy

## 2020-07-01 ENCOUNTER — Other Ambulatory Visit: Payer: Self-pay

## 2020-07-01 ENCOUNTER — Ambulatory Visit (HOSPITAL_COMMUNITY): Payer: Medicare HMO | Admitting: Physical Therapy

## 2020-07-01 ENCOUNTER — Encounter (HOSPITAL_COMMUNITY): Payer: Self-pay | Admitting: Physical Therapy

## 2020-07-01 DIAGNOSIS — S81802S Unspecified open wound, left lower leg, sequela: Secondary | ICD-10-CM

## 2020-07-01 DIAGNOSIS — M79662 Pain in left lower leg: Secondary | ICD-10-CM | POA: Diagnosis not present

## 2020-07-01 DIAGNOSIS — S81801A Unspecified open wound, right lower leg, initial encounter: Secondary | ICD-10-CM | POA: Diagnosis not present

## 2020-07-01 DIAGNOSIS — M79661 Pain in right lower leg: Secondary | ICD-10-CM

## 2020-07-01 NOTE — Therapy (Signed)
[facility] [address] [place], [place], [address] Phone: [phone]   Fax:  [phone]  Wound Care Therapy  Patient Details  Name: Mitchell [name] MRN: [id number] Date of Birth: [birth year] Referring Provider (PT): Kirstie Peri   Encounter Date: 07/01/2020   PT End of Session - 07/01/20 1352     Visit Number 4    Number of Visits 8    Date for PT Re-Evaluation 07/20/20    Authorization Type [employer] medicare -no visit limit or auth    Progress Note Due on Visit 8    PT Start Time 1400    PT Stop Time 1450    PT Time Calculation (min) 50 min    Activity Tolerance Patient tolerated treatment well    Behavior During Therapy [facility] for tasks assessed/performed             Past Medical History:  Diagnosis Date   Arthritis    Cellulitis of right lower extremity 05/09/2014   Diabetes mellitus without complication (HCC)    Diabetic foot ulcer (HCC) 07/07/2014   Status post bedside debridement of multiple right plantar ulcers by podiatrist, Dr. Reynolds Bowl.   Hypercholesterolemia    Maggot infestation    right foot ulcer   Obesity 05/09/2014   Tobacco abuse 07/07/2014    Past Surgical History:  Procedure Laterality Date   APPENDECTOMY     PERIPHERAL VASCULAR CATHETERIZATION N/A 07/22/2015   Procedure: Abdominal Aortogram w/Lower Extremity;  Surgeon: Sherren Kerns, MD;  Location: [facility] INVASIVE CV LAB;  Service: Cardiovascular;  Laterality: N/A;    There were no vitals filed for this visit.      [facility] PT Assessment - 07/01/20 0001       Assessment   Medical Diagnosis non healing wounds B LE    Referring Provider (PT) Kirstie Peri    Onset Date/Surgical Date 06/14/20                     Wound Therapy - 07/01/20 0001     Subjective States he wants to take a shower    Patient and Family Stated Goals wounds to heal, weeping to stop    Date of Onset 06/14/20    Prior Treatments self care    Pain Score 0-No pain    Evaluation and  Treatment Procedures Explained to Patient/Family Yes    Evaluation and Treatment Procedures agreed to    Wound Properties Date First Assessed: 06/20/20 Time First Assessed: 1325 Wound Type: Venous stasis ulcer Location: Pretibial Location Orientation: Right Present on Admission: Yes   Dressing Type Compression wrap    Dressing Changed Changed    Dressing Status Old drainage    Dressing Change Frequency PRN    Site / Wound Assessment Dry;Granulation tissue    % Wound base Red or Granulating 100%   4 wounds on left   Peri-wound Assessment Intact;Edema   indurated   Drainage Amount Scant    Drainage Description Serosanguineous    Treatment Cleansed;Debridement (Selective)    Wound Properties Date First Assessed: 06/20/20 Time First Assessed: 1330 Location: Pretibial Location Orientation: Left Wound Description (Comments): multiple small scabs on the front with one large scab on the back   Dressing Type Compression wrap    Dressing Changed Changed    Dressing Status Old drainage    Dressing Change Frequency PRN    Site / Wound Assessment Dry    % Wound base Red or Granulating 100%  2 wounds on right - to note there is a pressure spot on base of right foot - see media   Peri-wound Assessment Intact;Edema   indurated   Drainage Amount Scant    Drainage Description Serosanguineous    Treatment Cleansed;Debridement (Selective)    Selective Debridement - Location BLE anterior, Lt posterior    Selective Debridement - Tools Used Forceps    Selective Debridement - Tissue Removed dry skin, scab    Wound Therapy - Clinical Statement Number of wounds on legs continues to decrease. During right foot inspection, noted callus and dark purple spot along fifth metatarsal. Took picture and will continue to monitor. Educated patient on importance of wearing his diabetic shoes which he says he doesn't wear as they are heavy and uncomfortable. Showed picture of pressure spot to patient to help encourage him to  wear his diabetic shoes. Will follow up with this next session. Continued with cleansing and massage of both legs. Applied xeroform to anterior right shin today but continued with vaseoline and profore lite elsewhere.    Wound Therapy - Functional Problem List difficulty in dressing , nonhealing wounds    Factors Delaying/Impairing Wound Healing Altered sensation;Diabetes Mellitus;Multiple medical problems;Tobacco use;Vascular compromise    Hydrotherapy Plan Dressing change;Debridement;Patient/family education    Wound Therapy - Frequency 2X / week   4 weeks   Wound Therapy - Current Recommendations PT    Wound Therapy - Follow Up Recommendations --   op PT   Wound Plan cleans, debride and lite compression until all wounds are healed then send pt to either [facility] or [facility] compression fitting due to possible arterial component pt may do better with 15-20 vx 20-30.    Dressing  vaseline/lotion, profore lite B wiht additonal ABD pad and cotton for cone shape. xeroform on one spot on right shin                     PT Education - 07/01/20 1507     Education Details about importance of wearing diabetic shoes to reduce pressure on right foot.    Person(s) Educated Patient    Methods Explanation    Comprehension Verbalized understanding              PT Short Term Goals - 06/20/20 1506       PT SHORT TERM GOAL #1   Title Less than five wounds on Rt LE as well as LE LE    Time 2    Period Weeks    Target Date 07/04/20      PT SHORT TERM GOAL #2   Title Wounds to be 100% granulated to allow healing    Time 2    Period Weeks    Status New      PT SHORT TERM GOAL #3   Title LE to no longer be indurated to allow improved healing    Time 2    Period Weeks    Status New               PT Long Term Goals - 06/20/20 1507       PT LONG TERM GOAL #1   Title PT to have no pain in his legs.    Time 4    Period Weeks    Status New      PT  LONG TERM GOAL #2   Title Wounds to be healed    Time 4    Period Weeks  Status New      PT LONG TERM GOAL #3   Title PT to be able to demonstrate the ability to don compression garment on to prevent future wounds    Time 4    Period Weeks    Status New                   Plan - 07/01/20 1352     Clinical Impression Statement see above    Personal Factors and Comorbidities Comorbidity 3+    Comorbidities smoker. DM, vascular compromise    Examination-Activity Limitations Bathing;Dressing;Locomotion Level    Examination-Participation Restrictions Yard Work;Shop    Stability/Clinical Decision Making Stable/Uncomplicated    Rehab Potential Good    PT Frequency 2x / week    PT Duration 4 weeks    PT Treatment/Interventions Other (comment)   cleans, moisturize, debridement and dressing change   PT Next Visit Plan assess wound and comfort of bandaging.   As soon as wounds are healed refer for compression garment .             Patient will benefit from skilled therapeutic intervention in order to improve the following deficits and impairments:  Pain, Other (comment), Increased edema (non healing wounds.)  Visit Diagnosis: Pain in left lower leg  Multiple open wounds of right lower leg  Multiple open wounds of lower leg, left, sequela  Pain in right lower leg     Problem List Patient Active Problem List   Diagnosis Date Noted   Closed fracture of proximal end of right humerus with routine healing 02/25/2019   Pre-ulcerative calluses 04/20/2018   Myositis 07/08/2014   Diabetic foot ulcer associated with type 2 diabetes mellitus (HCC) 07/07/2014   Tobacco abuse 07/07/2014   Infestation, maggots 07/07/2014   Plantar ulcer of right foot (HCC) 05/12/2014   Pulse weakness    Peripheral neuropathy    Cellulitis of right lower extremity 05/09/2014   Obesity 05/09/2014   Cellulitis of right lower leg 05/09/2014   Hyperglycemia 03/21/2013   IDDM (insulin  dependent diabetes mellitus) 03/21/2013   Noncompliance with medications due to cost issues 03/21/2013   3:08 PM, 07/01/20 Tereasa Coop, DPT Physical Therapy with [facility]  [phone] office   [facility] [address] [place], [place], [address] Phone: [phone]   Fax:  [phone]  Name: Hersey Maclellan MRN: [id number] Date of Birth: Aug 08, 1950

## 2020-07-04 ENCOUNTER — Ambulatory Visit (HOSPITAL_COMMUNITY): Payer: Medicare HMO | Admitting: Physical Therapy

## 2020-07-05 ENCOUNTER — Ambulatory Visit (HOSPITAL_COMMUNITY): Payer: Medicare HMO

## 2020-07-05 ENCOUNTER — Encounter (HOSPITAL_COMMUNITY): Payer: Self-pay

## 2020-07-05 ENCOUNTER — Other Ambulatory Visit: Payer: Self-pay

## 2020-07-05 DIAGNOSIS — M79662 Pain in left lower leg: Secondary | ICD-10-CM

## 2020-07-05 DIAGNOSIS — S81801A Unspecified open wound, right lower leg, initial encounter: Secondary | ICD-10-CM

## 2020-07-05 DIAGNOSIS — S81802S Unspecified open wound, left lower leg, sequela: Secondary | ICD-10-CM | POA: Diagnosis not present

## 2020-07-05 DIAGNOSIS — M79661 Pain in right lower leg: Secondary | ICD-10-CM | POA: Diagnosis not present

## 2020-07-05 NOTE — Therapy (Signed)
[facility] [address] [place], [place], [address] Phone: [phone]   Fax:  [phone]  Wound Care Therapy  Patient Details  Name: Alize Acy MRN: [id number] Date of Birth: 05-10-1950 Referring Provider (PT): Kirstie Peri   Encounter Date: 07/05/2020   PT End of Session - 07/05/20 1532     Visit Number 5    Number of Visits 8    Date for PT Re-Evaluation 07/20/20    Authorization Type [employer] medicare -no visit limit or auth    Progress Note Due on Visit 8    PT Start Time 1450    PT Stop Time 1528    PT Time Calculation (min) 38 min    Activity Tolerance Patient tolerated treatment well    Behavior During Therapy [facility] for tasks assessed/performed             Past Medical History:  Diagnosis Date   Arthritis    Cellulitis of right lower extremity 05/09/2014   Diabetes mellitus without complication (HCC)    Diabetic foot ulcer (HCC) 07/07/2014   Status post bedside debridement of multiple right plantar ulcers by podiatrist, Dr. Reynolds Bowl.   Hypercholesterolemia    Maggot infestation    right foot ulcer   Obesity 05/09/2014   Tobacco abuse 07/07/2014    Past Surgical History:  Procedure Laterality Date   APPENDECTOMY     PERIPHERAL VASCULAR CATHETERIZATION N/A 07/22/2015   Procedure: Abdominal Aortogram w/Lower Extremity;  Surgeon: Sherren Kerns, MD;  Location: [facility] INVASIVE CV LAB;  Service: Cardiovascular;  Laterality: N/A;    There were no vitals filed for this visit.    Subjective Assessment - 07/05/20 1553     Subjective Pt arrived with dressings intact and reports of comfort, pt wearing crocs shoes.  Stated the diabetic shoes are heavy.    Currently in Pain? No/denies                       Wound Therapy - 07/05/20 0001     Subjective Pt arrived with dressings intact and reports of comfort, pt wearing crocs shoes.  Stated the diabetic shoes are heavy.    Patient and Family Stated Goals wounds to heal,  weeping to stop    Date of Onset 06/14/20    Prior Treatments self care    Pain Scale 0-10    Pain Score 0-No pain    Evaluation and Treatment Procedures Explained to Patient/Family Yes    Evaluation and Treatment Procedures agreed to    Wound Properties Date First Assessed: 06/20/20 Time First Assessed: 1325 Wound Type: Venous stasis ulcer Location: Pretibial Location Orientation: Right Present on Admission: Yes   Dressing Type Compression wrap    Dressing Changed Changed    Dressing Status Old drainage    Dressing Change Frequency PRN    Site / Wound Assessment Dry;Granulation tissue    % Wound base Red or Granulating 100%   2 small open anterior shin   % Wound base Yellow/Fibrinous Exudate 0%    Peri-wound Assessment Intact;Edema;Induration    Drainage Amount Scant    Drainage Description Serosanguineous    Treatment Cleansed    Wound Properties Date First Assessed: 06/20/20 Time First Assessed: 1330 Location: Pretibial Location Orientation: Left Wound Description (Comments): multiple small scabs on the front with one large scab on the back   Dressing Type Compression wrap    Dressing Changed Changed    Dressing Status Old drainage  Dressing Change Frequency PRN    Site / Wound Assessment Dry    % Wound base Red or Granulating 100%   1 wound present anterior   % Wound base Yellow/Fibrinous Exudate 0%    Peri-wound Assessment Intact;Edema;Induration    Drainage Amount Scant    Drainage Description Serosanguineous    Treatment Cleansed;Debridement (Selective)    Selective Debridement - Location BLE anterior, Lt posterior    Selective Debridement - Tools Used Forceps    Selective Debridement - Tissue Removed dry skin, scab    Wound Therapy - Clinical Statement Pt arrived wearing crocs shoes, educated and reinforced importance of pressure relief shoes to assist with foot wound.  Overall wounds are progressing well.  Educated importance of wearing compression garments for edema and  prevention of future wounds.  Pt stated he has a old pair.  Measurements taken and handout given for new 15-20 mmHg compression garments.  Feel pt will need butler as well to assist wiht donning.    Wound Therapy - Functional Problem List difficulty in dressing , nonhealing wounds    Factors Delaying/Impairing Wound Healing Altered sensation;Diabetes Mellitus;Multiple medical problems;Tobacco use;Vascular compromise    Hydrotherapy Plan Dressing change;Debridement;Patient/family education    Wound Therapy - Frequency 2X / week   4 weeks   Wound Therapy - Current Recommendations PT    Wound Therapy - Follow Up Recommendations --   op PT   Wound Plan cleans, debride and lite compression until all wounds are healed then send pt to either [facility] or [facility] compression fitting due to possible arterial component pt may do better with 15-20 vx 20-30.    Dressing  vaseline/lotion, profore lite B wiht additonal ABD pad and cotton for cone shape. xeroform on one spot on right shin                     PT Education - 07/05/20 1609     Education Details Importance of wearing diabetic shoes for pressure relief, Educated benefits, measurements taken and handout given for new compression garments.  Discussed benefits with quitting smoking, given smoking cessessation handout.    Person(s) Educated Patient    Methods Explanation;Handout    Comprehension Verbalized understanding              PT Short Term Goals - 06/20/20 1506       PT SHORT TERM GOAL #1   Title Less than five wounds on Rt LE as well as LE LE    Time 2    Period Weeks    Target Date 07/04/20      PT SHORT TERM GOAL #2   Title Wounds to be 100% granulated to allow healing    Time 2    Period Weeks    Status New      PT SHORT TERM GOAL #3   Title LE to no longer be indurated to allow improved healing    Time 2    Period Weeks    Status New               PT Long Term Goals - 06/20/20  1507       PT LONG TERM GOAL #1   Title PT to have no pain in his legs.    Time 4    Period Weeks    Status New      PT LONG TERM GOAL #2   Title Wounds to be healed    Time  4    Period Weeks    Status New      PT LONG TERM GOAL #3   Title PT to be able to demonstrate the ability to don compression garment on to prevent future wounds    Time 4    Period Weeks    Status New                    Patient will benefit from skilled therapeutic intervention in order to improve the following deficits and impairments:     Visit Diagnosis: Pain in left lower leg  Multiple open wounds of right lower leg  Multiple open wounds of lower leg, left, sequela  Pain in right lower leg     Problem List Patient Active Problem List   Diagnosis Date Noted   Closed fracture of proximal end of right humerus with routine healing 02/25/2019   Pre-ulcerative calluses 04/20/2018   Myositis 07/08/2014   Diabetic foot ulcer associated with type 2 diabetes mellitus (HCC) 07/07/2014   Tobacco abuse 07/07/2014   Infestation, maggots 07/07/2014   Plantar ulcer of right foot (HCC) 05/12/2014   Pulse weakness    Peripheral neuropathy    Cellulitis of right lower extremity 05/09/2014   Obesity 05/09/2014   Cellulitis of right lower leg 05/09/2014   Hyperglycemia 03/21/2013   IDDM (insulin dependent diabetes mellitus) 03/21/2013   Noncompliance with medications due to cost issues 03/21/2013   Becky Sax, LPTA/CLT; CBIS [phone]  Juel Burrow 07/05/2020, 6:58 PM  [facility] [address] [place], [place], [address] Phone: [phone]   Fax:  [phone]  Name: Roylee Chaffin MRN: [id number] Date of Birth: 25-Nov-1950

## 2020-07-06 ENCOUNTER — Ambulatory Visit (HOSPITAL_COMMUNITY): Payer: Medicare HMO | Admitting: Physical Therapy

## 2020-07-06 ENCOUNTER — Telehealth (HOSPITAL_COMMUNITY): Payer: Self-pay

## 2020-07-06 NOTE — Telephone Encounter (Signed)
Patient called today wanted to go over his schedule - confirmed patients future appointments and he confirmed he will be here. Patient did not have the correct copy of his schedule.

## 2020-07-08 ENCOUNTER — Ambulatory Visit (HOSPITAL_COMMUNITY): Payer: Medicare HMO

## 2020-07-08 ENCOUNTER — Telehealth (HOSPITAL_COMMUNITY): Payer: Self-pay

## 2020-07-08 DIAGNOSIS — E1129 Type 2 diabetes mellitus with other diabetic kidney complication: Secondary | ICD-10-CM | POA: Diagnosis not present

## 2020-07-08 NOTE — Telephone Encounter (Signed)
No show, called concerning missed apt today.  No answer, mailbox full unable to leave message.  Becky Sax, LPTA/CLT; Rowe Clack [phone]

## 2020-07-11 ENCOUNTER — Other Ambulatory Visit: Payer: Self-pay

## 2020-07-11 ENCOUNTER — Ambulatory Visit (HOSPITAL_COMMUNITY): Payer: Medicare HMO | Admitting: Physical Therapy

## 2020-07-11 DIAGNOSIS — S81802S Unspecified open wound, left lower leg, sequela: Secondary | ICD-10-CM | POA: Diagnosis not present

## 2020-07-11 DIAGNOSIS — M79661 Pain in right lower leg: Secondary | ICD-10-CM

## 2020-07-11 DIAGNOSIS — M79662 Pain in left lower leg: Secondary | ICD-10-CM | POA: Diagnosis not present

## 2020-07-11 DIAGNOSIS — S81801A Unspecified open wound, right lower leg, initial encounter: Secondary | ICD-10-CM | POA: Diagnosis not present

## 2020-07-11 NOTE — Therapy (Signed)
[facility] [address] [place], [place], [address] Phone: [phone]   Fax:  [phone]  Physical Therapy Treatment  Patient Details  Name: Mitchell [name] MRN: [id number] Date of Birth: 22-Sep-1950 Referring Provider (PT): Kirstie Peri   Encounter Date: 07/11/2020   PT End of Session - 07/11/20 1615     Visit Number 6    Number of Visits 8    Date for PT Re-Evaluation 07/20/20    Authorization Type [employer] medicare -no visit limit or auth    Progress Note Due on Visit 8    PT Start Time 1320    PT Stop Time 1400    PT Time Calculation (min) 40 min    Activity Tolerance Patient tolerated treatment well    Behavior During Therapy [facility] for tasks assessed/performed             Past Medical History:  Diagnosis Date   Arthritis    Cellulitis of right lower extremity 05/09/2014   Diabetes mellitus without complication (HCC)    Diabetic foot ulcer (HCC) 07/07/2014   Status post bedside debridement of multiple right plantar ulcers by podiatrist, Dr. Reynolds Bowl.   Hypercholesterolemia    Maggot infestation    right foot ulcer   Obesity 05/09/2014   Tobacco abuse 07/07/2014    Past Surgical History:  Procedure Laterality Date   APPENDECTOMY     PERIPHERAL VASCULAR CATHETERIZATION N/A 07/22/2015   Procedure: Abdominal Aortogram w/Lower Extremity;  Surgeon: Sherren Kerns, MD;  Location: [facility] INVASIVE CV LAB;  Service: Cardiovascular;  Laterality: N/A;    There were no vitals filed for this visit.   Subjective Assessment - 07/11/20 1613     Subjective pt states he did not get his stockings; states he does not get "paid" until 7/1 and he does not have a credit card to order stockings.  States he will get them locally. No reason for missed last appt.  No pain or issues.                                       PT Education - 07/11/20 1616     Education Details He must get his stockings prior to next visit as we can no  longer see him due to wounds are now healed.  Educated to continue wearing his diabetic shoes    Person(s) Educated Patient    Methods Explanation    Comprehension Verbalized understanding              PT Short Term Goals - 06/20/20 1506       PT SHORT TERM GOAL #1   Title Less than five wounds on Rt LE as well as LE LE    Time 2    Period Weeks    Target Date 07/04/20      PT SHORT TERM GOAL #2   Title Wounds to be 100% granulated to allow healing    Time 2    Period Weeks    Status New      PT SHORT TERM GOAL #3   Title LE to no longer be indurated to allow improved healing    Time 2    Period Weeks    Status New               PT Long Term Goals - 06/20/20 1507  PT LONG TERM GOAL #1   Title PT to have no pain in his legs.    Time 4    Period Weeks    Status New      PT LONG TERM GOAL #2   Title Wounds to be healed    Time 4    Period Weeks    Status New      PT LONG TERM GOAL #3   Title PT to be able to demonstrate the ability to don compression garment on to prevent future wounds    Time 4    Period Weeks    Status New                   Plan - 07/11/20 1617     Clinical Impression Statement All wounds are now healed and patient's LE's are decompressed using the profores bilaterally.  Explained to pateint He must get his stockings prior to next visit as we can no longer see him due to wounds are now healed.  Educated to continue wearing his diabetic shoes.  LE's cleansed, moisturized and rebandadaged using profores bilaterally.    Personal Factors and Comorbidities Comorbidity 3+    Comorbidities smoker. DM, vascular compromise    Examination-Activity Limitations Bathing;Dressing;Locomotion Level    Examination-Participation Restrictions Yard Work;Shop    Stability/Clinical Decision Making Stable/Uncomplicated    Rehab Potential Good    PT Frequency 2x / week    PT Duration 4 weeks    PT Treatment/Interventions Other (comment)    cleans, moisturize, debridement and dressing change   PT Next Visit Plan Assure pt is able to don/doff his compression garments next visit and discharge garment .             Patient will benefit from skilled therapeutic intervention in order to improve the following deficits and impairments:  Pain, Other (comment), Increased edema (non healing wounds.)  Visit Diagnosis: Pain in left lower leg  Multiple open wounds of right lower leg  Multiple open wounds of lower leg, left, sequela  Pain in right lower leg     Problem List Patient Active Problem List   Diagnosis Date Noted   Closed fracture of proximal end of right humerus with routine healing 02/25/2019   Pre-ulcerative calluses 04/20/2018   Myositis 07/08/2014   Diabetic foot ulcer associated with type 2 diabetes mellitus (HCC) 07/07/2014   Tobacco abuse 07/07/2014   Infestation, maggots 07/07/2014   Plantar ulcer of right foot (HCC) 05/12/2014   Pulse weakness    Peripheral neuropathy    Cellulitis of right lower extremity 05/09/2014   Obesity 05/09/2014   Cellulitis of right lower leg 05/09/2014   Hyperglycemia 03/21/2013   IDDM (insulin dependent diabetes mellitus) 03/21/2013   Noncompliance with medications due to cost issues 03/21/2013   Lurena Nida, PTA/CLT [phone]  Lurena Nida 07/11/2020, 4:20 PM  [facility] [address] [place], [place], [address] Phone: [phone]   Fax:  [phone]  Name: Mitchell [name] MRN: [id number] Date of Birth: 12/29/1950

## 2020-07-13 ENCOUNTER — Ambulatory Visit (HOSPITAL_COMMUNITY): Payer: Medicare HMO

## 2020-07-14 DIAGNOSIS — E1165 Type 2 diabetes mellitus with hyperglycemia: Secondary | ICD-10-CM | POA: Diagnosis not present

## 2020-07-14 DIAGNOSIS — I10 Essential (primary) hypertension: Secondary | ICD-10-CM | POA: Diagnosis not present

## 2020-07-14 DIAGNOSIS — Z794 Long term (current) use of insulin: Secondary | ICD-10-CM | POA: Diagnosis not present

## 2020-07-14 DIAGNOSIS — Z299 Encounter for prophylactic measures, unspecified: Secondary | ICD-10-CM | POA: Diagnosis not present

## 2020-07-14 DIAGNOSIS — F17210 Nicotine dependence, cigarettes, uncomplicated: Secondary | ICD-10-CM | POA: Diagnosis not present

## 2020-07-14 DIAGNOSIS — Z6841 Body Mass Index (BMI) 40.0 and over, adult: Secondary | ICD-10-CM | POA: Diagnosis not present

## 2020-07-14 DIAGNOSIS — R030 Elevated blood-pressure reading, without diagnosis of hypertension: Secondary | ICD-10-CM | POA: Diagnosis not present

## 2020-07-14 DIAGNOSIS — R69 Illness, unspecified: Secondary | ICD-10-CM | POA: Diagnosis not present

## 2020-07-15 ENCOUNTER — Encounter (HOSPITAL_COMMUNITY): Payer: Self-pay | Admitting: Physical Therapy

## 2020-07-15 ENCOUNTER — Ambulatory Visit (HOSPITAL_COMMUNITY): Payer: Medicare HMO | Attending: Internal Medicine | Admitting: Physical Therapy

## 2020-07-15 ENCOUNTER — Other Ambulatory Visit: Payer: Self-pay

## 2020-07-15 DIAGNOSIS — M79662 Pain in left lower leg: Secondary | ICD-10-CM | POA: Insufficient documentation

## 2020-07-15 DIAGNOSIS — S81801A Unspecified open wound, right lower leg, initial encounter: Secondary | ICD-10-CM | POA: Diagnosis not present

## 2020-07-15 DIAGNOSIS — S81802S Unspecified open wound, left lower leg, sequela: Secondary | ICD-10-CM

## 2020-07-15 DIAGNOSIS — M79661 Pain in right lower leg: Secondary | ICD-10-CM | POA: Insufficient documentation

## 2020-07-15 NOTE — Therapy (Signed)
[facility] [address] [place], [place], [address] Phone: [phone]   Fax:  [phone]  Wound Care Therapy  Patient Details  Name: Mitchell [name] MRN: [id number] Date of Birth: January 02, 1951 Referring Provider (PT): Kirstie Peri   Encounter Date: 07/15/2020   PT End of Session - 07/15/20 1403     Visit Number 7    Number of Visits 8    Date for PT Re-Evaluation 07/20/20    Authorization Type [employer] medicare -no visit limit or auth    Progress Note Due on Visit 8    PT Start Time 1320    PT Stop Time 1355    PT Time Calculation (min) 35 min    Activity Tolerance Patient tolerated treatment well    Behavior During Therapy [facility] for tasks assessed/performed             Past Medical History:  Diagnosis Date   Arthritis    Cellulitis of right lower extremity 05/09/2014   Diabetes mellitus without complication (HCC)    Diabetic foot ulcer (HCC) 07/07/2014   Status post bedside debridement of multiple right plantar ulcers by podiatrist, Dr. Reynolds Bowl.   Hypercholesterolemia    Maggot infestation    right foot ulcer   Obesity 05/09/2014   Tobacco abuse 07/07/2014    Past Surgical History:  Procedure Laterality Date   APPENDECTOMY     PERIPHERAL VASCULAR CATHETERIZATION N/A 07/22/2015   Procedure: Abdominal Aortogram w/Lower Extremity;  Surgeon: Sherren Kerns, MD;  Location: [facility] INVASIVE CV LAB;  Service: Cardiovascular;  Laterality: N/A;    There were no vitals filed for this visit.    Subjective Assessment - 07/15/20 1406     Subjective reports that he bought garments and has been wearing his diabetic shoes                                PT Education - 07/15/20 1357     Education Details on donning and doffing compression garments with butler including making a handle - able to get on but very challenging - unable to remove - discussed getting less compression and larger size    Person(s) Educated Patient     Methods Explanation    Comprehension Verbalized understanding              PT Short Term Goals - 06/20/20 1506       PT SHORT TERM GOAL #1   Title Less than five wounds on Rt LE as well as LE LE    Time 2    Period Weeks    Target Date 07/04/20      PT SHORT TERM GOAL #2   Title Wounds to be 100% granulated to allow healing    Time 2    Period Weeks    Status New      PT SHORT TERM GOAL #3   Title LE to no longer be indurated to allow improved healing    Time 2    Period Weeks    Status New               PT Long Term Goals - 06/20/20 1507       PT LONG TERM GOAL #1   Title PT to have no pain in his legs.    Time 4    Period Weeks    Status New      PT  LONG TERM GOAL #2   Title Wounds to be healed    Time 4    Period Weeks    Status New      PT LONG TERM GOAL #3   Title PT to be able to demonstrate the ability to don compression garment on to prevent future wounds    Time 4    Period Weeks    Status New                   Plan - 07/15/20 1406     Clinical Impression Statement wounds all healed. Discussed getting larger compression garments as patient was referring to measurements for butler. Practiced donning and doffing garments and using large butler. Paitent will obtain new garment and return to clinic. Did not wrap patient on this date.    Personal Factors and Comorbidities Comorbidity 3+    Comorbidities smoker. DM, vascular compromise    Examination-Activity Limitations Bathing;Dressing;Locomotion Level    Examination-Participation Restrictions Yard Work;Shop    Stability/Clinical Decision Making Stable/Uncomplicated    Rehab Potential Good    PT Frequency 2x / week    PT Duration 4 weeks    PT Treatment/Interventions Other (comment)   cleans, moisturize, debridement and dressing change   PT Next Visit Plan Assure pt is able to don/doff his compression garments next visit and discharge garment .             Patient will  benefit from skilled therapeutic intervention in order to improve the following deficits and impairments:  Pain, Other (comment), Increased edema (non healing wounds.)  Visit Diagnosis: Pain in left lower leg  Multiple open wounds of right lower leg  Pain in right lower leg  Multiple open wounds of lower leg, left, sequela     Problem List Patient Active Problem List   Diagnosis Date Noted   Closed fracture of proximal end of right humerus with routine healing 02/25/2019   Pre-ulcerative calluses 04/20/2018   Myositis 07/08/2014   Diabetic foot ulcer associated with type 2 diabetes mellitus (HCC) 07/07/2014   Tobacco abuse 07/07/2014   Infestation, maggots 07/07/2014   Plantar ulcer of right foot (HCC) 05/12/2014   Pulse weakness    Peripheral neuropathy    Cellulitis of right lower extremity 05/09/2014   Obesity 05/09/2014   Cellulitis of right lower leg 05/09/2014   Hyperglycemia 03/21/2013   IDDM (insulin dependent diabetes mellitus) 03/21/2013   Noncompliance with medications due to cost issues 03/21/2013   2:40 PM, 07/15/20 Tereasa Coop, DPT Physical Therapy with [facility]  [phone] office   [facility] [address] [place], [place], [address] Phone: [phone]   Fax:  [phone]  Name: Mitchell [name] MRN: [id number] Date of Birth: Jul 14, 1950

## 2020-07-21 ENCOUNTER — Ambulatory Visit (HOSPITAL_COMMUNITY): Payer: Medicare HMO | Admitting: Physical Therapy

## 2020-07-21 ENCOUNTER — Encounter (HOSPITAL_COMMUNITY): Payer: Self-pay | Admitting: Physical Therapy

## 2020-07-21 ENCOUNTER — Other Ambulatory Visit: Payer: Self-pay

## 2020-07-21 DIAGNOSIS — M79661 Pain in right lower leg: Secondary | ICD-10-CM

## 2020-07-21 DIAGNOSIS — M79662 Pain in left lower leg: Secondary | ICD-10-CM

## 2020-07-21 DIAGNOSIS — S81801A Unspecified open wound, right lower leg, initial encounter: Secondary | ICD-10-CM | POA: Diagnosis not present

## 2020-07-21 DIAGNOSIS — S81802S Unspecified open wound, left lower leg, sequela: Secondary | ICD-10-CM | POA: Diagnosis not present

## 2020-07-21 NOTE — Therapy (Signed)
[place], [place], [address] Phone: [phone]   Fax:  [phone]  Physical Therapy Treatment, and RECERT and DC   Patient Details  Name: Mitchell [name] MRN: [id number] Date of Birth: Oct 16, 1950 Referring Provider (PT): Monico Blitz  PHYSICAL THERAPY DISCHARGE SUMMARY  Visits from Start of Care: 8  Current functional level related to goals / functional outcomes: See below    Remaining deficits: See below   Education / Equipment: See below  Patient agrees to discharge. Patient goals were met. Patient is being discharged due to meeting the stated rehab goals.   Encounter Date: 07/21/2020   PT End of Session - 07/21/20 0918     Visit Number 8    Number of Visits 8    Date for PT Re-Evaluation 07/20/20    Authorization Type [employer] medicare -no visit limit or auth    Progress Note Due on Visit 8    PT Start Time 0918    PT Stop Time 0945    PT Time Calculation (min) 27 min    Activity Tolerance Patient tolerated treatment well    Behavior During Therapy [facility] for tasks assessed/performed             Past Medical History:  Diagnosis Date   Arthritis    Cellulitis of right lower extremity 05/09/2014   Diabetes mellitus without complication ([place])    Diabetic foot ulcer ([place]) 07/07/2014   Status post bedside debridement of multiple right plantar ulcers by podiatrist, Dr. Laurena Spies.   Hypercholesterolemia    Maggot infestation    right foot ulcer   Obesity 05/09/2014   Tobacco abuse 07/07/2014    Past Surgical History:  Procedure Laterality Date   APPENDECTOMY     PERIPHERAL VASCULAR CATHETERIZATION N/A 07/22/2015   Procedure: Abdominal Aortogram w/Lower Extremity;  Surgeon: Elam Dutch, MD;  Location: [place] CV LAB;  Service: Cardiovascular;  Laterality: N/A;    There were no vitals filed for this visit.   Subjective Assessment - 07/21/20 0941     Subjective States that he got his compression  garments and has been wearing them but that it is quite a bit of difficulty getting them on and off.    Currently in Pain? No/denies                                       PT Education - 07/21/20 0933     Education Details on how to don/doff socks with butler - practiced - prior demonstration and practice, answered all questions and provided pt with butler on different strategies to achieve motion needed.    Person(s) Educated Patient    Methods Explanation    Comprehension Verbalized understanding              PT Short Term Goals - 07/21/20 0940       PT SHORT TERM GOAL #1   Title Less than five wounds on Rt LE as well as LE LE    Time 2    Period Weeks    Status Achieved    Target Date 07/04/20      PT SHORT TERM GOAL #2   Title Wounds to be 100% granulated to allow healing    Time 2    Period Weeks    Status Achieved      PT SHORT TERM GOAL #3  Title LE to no longer be indurated to allow improved healing    Time 2    Period Weeks    Status Achieved               PT Long Term Goals - 07/21/20 0941       PT LONG TERM GOAL #1   Title PT to have no pain in his legs.    Time 4    Period Weeks    Status Achieved      PT LONG TERM GOAL #2   Title Wounds to be healed    Time 4    Period Weeks    Status Achieved      PT LONG TERM GOAL #3   Title PT to be able to demonstrate the ability to don compression garment on to prevent future wounds    Time 4    Period Weeks    Status Achieved                   Plan - 07/21/20 0949     Clinical Impression Statement Session focused on educating patient on how to properly don/doff compression garments with use of butler. Patient able to perform with new socks that have reduced compression. Answered all questions and provided patient with extra butler to continue to achieve goals at home. Patient to discharge from PT to HEP at this time.    Personal Factors and Comorbidities  Comorbidity 3+    Comorbidities smoker. DM, vascular compromise    Examination-Activity Limitations Bathing;Dressing;Locomotion Level    Examination-Participation Restrictions Yard Work;Shop    Stability/Clinical Decision Making Stable/Uncomplicated    Rehab Potential Good    PT Frequency 1x / week    PT Duration 2 weeks    PT Treatment/Interventions Other (comment)   cleans, moisturize, debridement and dressing change   PT Next Visit Plan DC to HEP             Patient will benefit from skilled therapeutic intervention in order to improve the following deficits and impairments:  Pain, Other (comment), Increased edema (non healing wounds.)  Visit Diagnosis: Pain in left lower leg  Multiple open wounds of right lower leg  Pain in right lower leg  Multiple open wounds of lower leg, left, sequela     Problem List Patient Active Problem List   Diagnosis Date Noted   Closed fracture of proximal end of right humerus with routine healing 02/25/2019   Pre-ulcerative calluses 04/20/2018   Myositis 07/08/2014   Diabetic foot ulcer associated with type 2 diabetes mellitus ([place]) 07/07/2014   Tobacco abuse 07/07/2014   Infestation, maggots 07/07/2014   Plantar ulcer of right foot ([place]) 05/12/2014   Pulse weakness    Peripheral neuropathy    Cellulitis of right lower extremity 05/09/2014   Obesity 05/09/2014   Cellulitis of right lower leg 05/09/2014   Hyperglycemia 03/21/2013   IDDM (insulin dependent diabetes mellitus) 03/21/2013   Noncompliance with medications due to cost issues 03/21/2013    9:50 AM, 07/21/20 Jerene Pitch, DPT Physical Therapy with [facility]  [phone] office   [place], [place], [address] Phone: [phone]   Fax:  [phone]  Name: Mitchell [name] MRN: [id number] Date of Birth: October 03, 1950

## 2020-08-01 DIAGNOSIS — E1129 Type 2 diabetes mellitus with other diabetic kidney complication: Secondary | ICD-10-CM | POA: Diagnosis not present

## 2020-09-07 DIAGNOSIS — R030 Elevated blood-pressure reading, without diagnosis of hypertension: Secondary | ICD-10-CM | POA: Diagnosis not present

## 2020-09-07 DIAGNOSIS — Z299 Encounter for prophylactic measures, unspecified: Secondary | ICD-10-CM | POA: Diagnosis not present

## 2020-09-07 DIAGNOSIS — M869 Osteomyelitis, unspecified: Secondary | ICD-10-CM | POA: Diagnosis not present

## 2020-09-07 DIAGNOSIS — E1165 Type 2 diabetes mellitus with hyperglycemia: Secondary | ICD-10-CM | POA: Diagnosis not present

## 2020-09-07 DIAGNOSIS — F17210 Nicotine dependence, cigarettes, uncomplicated: Secondary | ICD-10-CM | POA: Diagnosis not present

## 2020-09-07 DIAGNOSIS — E11621 Type 2 diabetes mellitus with foot ulcer: Secondary | ICD-10-CM | POA: Diagnosis not present

## 2020-09-07 DIAGNOSIS — L97509 Non-pressure chronic ulcer of other part of unspecified foot with unspecified severity: Secondary | ICD-10-CM | POA: Diagnosis not present

## 2020-09-07 DIAGNOSIS — R69 Illness, unspecified: Secondary | ICD-10-CM | POA: Diagnosis not present

## 2020-09-07 DIAGNOSIS — I10 Essential (primary) hypertension: Secondary | ICD-10-CM | POA: Diagnosis not present

## 2020-09-07 DIAGNOSIS — Z6841 Body Mass Index (BMI) 40.0 and over, adult: Secondary | ICD-10-CM | POA: Diagnosis not present

## 2020-09-20 ENCOUNTER — Other Ambulatory Visit: Payer: Self-pay

## 2020-09-20 ENCOUNTER — Encounter (HOSPITAL_COMMUNITY): Payer: Self-pay | Admitting: Physical Therapy

## 2020-09-20 ENCOUNTER — Ambulatory Visit (HOSPITAL_COMMUNITY): Payer: Medicare HMO | Attending: Internal Medicine | Admitting: Physical Therapy

## 2020-09-20 DIAGNOSIS — R262 Difficulty in walking, not elsewhere classified: Secondary | ICD-10-CM | POA: Diagnosis present

## 2020-09-20 DIAGNOSIS — S91301A Unspecified open wound, right foot, initial encounter: Secondary | ICD-10-CM | POA: Insufficient documentation

## 2020-09-20 NOTE — Therapy (Signed)
[place] [facility] [address] [place], [place], [address] Phone: [phone]   Fax:  [phone]  Wound Care Evaluation  Patient Details  Name: Mitchell [name] MRN: [id number] Date of Birth: 1950-10-20 Referring Provider (PT): Kirstie Peri MD   Encounter Date: 09/20/2020   PT End of Session - 09/20/20 1354     Visit Number 1    Number of Visits 12    Date for PT Re-Evaluation 11/01/20    Authorization Type [employer] Medicare  (no vl, no auth)    Progress Note Due on Visit 10    PT Start Time 1300    PT Stop Time 1348    PT Time Calculation (min) 48 min    Activity Tolerance Patient tolerated treatment well    Behavior During Therapy [facility] for tasks assessed/performed             Past Medical History:  Diagnosis Date   Arthritis    Cellulitis of right lower extremity 05/09/2014   Diabetes mellitus without complication (HCC)    Diabetic foot ulcer (HCC) 07/07/2014   Status post bedside debridement of multiple right plantar ulcers by podiatrist, Dr. Reynolds Bowl.   Hypercholesterolemia    Maggot infestation    right foot ulcer   Obesity 05/09/2014   Tobacco abuse 07/07/2014    Past Surgical History:  Procedure Laterality Date   APPENDECTOMY     PERIPHERAL VASCULAR CATHETERIZATION N/A 07/22/2015   Procedure: Abdominal Aortogram w/Lower Extremity;  Surgeon: Sherren Kerns, MD;  Location: [facility] INVASIVE CV LAB;  Service: Cardiovascular;  Laterality: N/A;    There were no vitals filed for this visit.     [facility] PT Assessment - 09/20/20 0001       Assessment   Medical Diagnosis R foot ulcer    Referring Provider (PT) Kirstie Peri MD    Onset Date/Surgical Date 09/13/20    Prior Therapy yes      Precautions   Precautions Other (comment);Fall   wound     Balance Screen   Has the patient fallen in the past 6 months No    Has the patient had a decrease in activity level because of a fear of falling?  No    Is the patient reluctant to leave their home  because of a fear of falling?  No             Wound Therapy - 09/20/20 0001     Subjective Patient states he has not been wearing his compression since wound started draining. He had a blister on his foot that turned into a wound about a week ago. He does not check his feet every day.    Patient and Family Stated Goals wounds to heal    Date of Onset 09/13/20    Prior Treatments self care    Pain Scale 0-10    Pain Score 0-No pain    Evaluation and Treatment Procedures Explained to Patient/Family Yes    Evaluation and Treatment Procedures agreed to    Wound Properties Date First Assessed: 09/20/20 Time First Assessed: 1300 Wound Type: Diabetic ulcer Location: Heel Location Orientation: Right Wound Description (Comments): R lateral plantar wound Present on Admission: Yes   Wound Image Images linked: 2    Dressing Type None    Dressing Changed Changed    Dressing Status None    Dressing Change Frequency PRN    Site / Wound Assessment Pink;Yellow;Dusky;Red;Other (Comment)   pale   % Wound  base Red or Granulating 50%    % Wound base Yellow/Fibrinous Exudate 50%    Peri-wound Assessment Maceration;Other (Comment)   callus   Wound Length (cm) 1.5 cm    Wound Width (cm) 1.6 cm    Wound Depth (cm) 1.2 cm    Wound Volume (cm^3) 2.88 cm^3    Wound Surface Area (cm^2) 2.4 cm^2    Drainage Amount Scant    Drainage Description Serous    Treatment Cleansed;Debridement (Selective)    Selective Debridement - Location R plantar wound and periwound    Selective Debridement - Tools Used Forceps;Scalpel;Scissors    Selective Debridement - Tissue Removed callus, wound bed    Wound Therapy - Clinical Statement Patient is a 70 y.o. male who presents to physical therapy with referral for right foot wound. Patient stating, he had a blister that turned into wound about a week ago. He was scheduled for next week but wanted to attend sooner due to increased drainage and odor recently. Patient has not been  using compression since wound started draining. Patient with induration present throughout bilateral LE. He has minimal draining R plantar lateral foot wound over metatarsal heads with macerated skin and callus surrounding. Cleansed and applied lotion/Vaseline to RLE as skin was dry throughout. Patient tolerates debridement of devitalized tissue surrounding wound bed with continued adherent slough in wound bed. Applied medihoney colloid, 4x4 and profore lite. Patient foot would not fit into shoe, gave patient darco shoe that of loads metatarsal heads. Patient will benefit from skilled physical therapy in order to promote wound healing.    Wound Therapy - Functional Problem List dressing, bathing, walking    Factors Delaying/Impairing Wound Healing Altered sensation;Diabetes Mellitus;Multiple medical problems;Tobacco use;Vascular compromise    Hydrotherapy Plan Debridement;Dressing change;Electrical stimulation;Patient/family education;Pulsatile lavage with suction    Wound Therapy - Frequency 2X / week   6 weeks   Wound Therapy - Current Recommendations PT    Wound Plan cleanse, debride, dressing changes for wound healing    Dressing  vaseline/lotion, medihoney colloid, 4x4, profore lite, #5 netting, darco shoe for offweighting toes                   Objective measurements completed on examination: See above findings.             PT Education - 09/20/20 1353     Education Details exam findings, POC, scope of PT, wearing comression garments    Person(s) Educated Patient    Methods Explanation;Demonstration    Comprehension Verbalized understanding;Returned demonstration              PT Short Term Goals - 09/20/20 1356       PT SHORT TERM GOAL #1   Title Patient will be able to verbalize daily skin checks.    Time 3    Period Weeks    Status New    Target Date 10/11/20      PT SHORT TERM GOAL #2   Title Wounds to be 100% granulated to allow healing    Time 3     Period Weeks    Status New    Target Date 10/11/20      PT SHORT TERM GOAL #3   Title LE to no longer be indurated to allow improved healing    Time 3    Period Weeks    Status New    Target Date 10/11/20  PT Long Term Goals - 09/20/20 1357       PT LONG TERM GOAL #1   Title Paitent's wound will be healed.    Time 6    Period Weeks    Status New    Target Date 11/01/20      PT LONG TERM GOAL #2   Title Patient will wear compression garments daily and continue skin checks.    Time 6    Period Weeks    Status New    Target Date 11/01/20                  Plan - 09/20/20 1355     Clinical Impression Statement see above    Personal Factors and Comorbidities Comorbidity 3+    Comorbidities smoker. DM, vascular compromise    Examination-Activity Limitations Bathing;Dressing;Locomotion Level    Examination-Participation Restrictions Yard Work;Shop    Stability/Clinical Decision Making Stable/Uncomplicated    Clinical Decision Making Low    Rehab Potential Good    PT Frequency 2x / week    PT Duration 6 weeks    PT Treatment/Interventions ADLs/Self Care Home Management;Gait training;Stair training;Functional mobility training;Therapeutic activities;Therapeutic exercise;Balance training;DME Instruction;Ultrasound;Neuromuscular re-education;Patient/family education;Compression bandaging;Manual lymph drainage;Manual techniques    PT Next Visit Plan debride and dressing changes to promote wound healing    Consulted and Agree with Plan of Care Patient             Patient will benefit from skilled therapeutic intervention in order to improve the following deficits and impairments:  Decreased skin integrity, Difficulty walking, Abnormal gait, Impaired sensation, Decreased mobility  Visit Diagnosis: Open wound of right foot, initial encounter  Difficulty in walking, not elsewhere classified    Problem List Patient Active Problem List    Diagnosis Date Noted   Closed fracture of proximal end of right humerus with routine healing 02/25/2019   Pre-ulcerative calluses 04/20/2018   Myositis 07/08/2014   Diabetic foot ulcer associated with type 2 diabetes mellitus (HCC) 07/07/2014   Tobacco abuse 07/07/2014   Infestation, maggots 07/07/2014   Plantar ulcer of right foot (HCC) 05/12/2014   Pulse weakness    Peripheral neuropathy    Cellulitis of right lower extremity 05/09/2014   Obesity 05/09/2014   Cellulitis of right lower leg 05/09/2014   Hyperglycemia 03/21/2013   IDDM (insulin dependent diabetes mellitus) 03/21/2013   Noncompliance with medications due to cost issues 03/21/2013   3:41 PM, 09/20/20 Wyman Songster PT, DPT Physical Therapist at [facility]   [facility] [facility] [address] [place], [place], [address] Phone: [phone]   Fax:  [phone]  Name: Esteven Overfelt MRN: [id number] Date of Birth: [birth year]

## 2020-09-22 ENCOUNTER — Ambulatory Visit (HOSPITAL_COMMUNITY): Payer: Medicare HMO | Admitting: Physical Therapy

## 2020-09-22 ENCOUNTER — Other Ambulatory Visit: Payer: Self-pay

## 2020-09-22 DIAGNOSIS — S91301A Unspecified open wound, right foot, initial encounter: Secondary | ICD-10-CM

## 2020-09-22 DIAGNOSIS — R262 Difficulty in walking, not elsewhere classified: Secondary | ICD-10-CM | POA: Diagnosis not present

## 2020-09-22 NOTE — Therapy (Signed)
[place] [facility] [address] [place], [place], [address] Phone: [phone]   Fax:  [phone]  Wound Care Therapy  Patient Details  Name: Mitchell [name] MRN: [id number] Date of Birth: Jun 24, 1950 Referring Provider (PT): Kirstie Peri MD   Encounter Date: 09/22/2020   PT End of Session - 09/22/20 1721     Visit Number 2    Number of Visits 12    Date for PT Re-Evaluation 11/01/20    Authorization Type A[employer]na Medicare  (no vl, no auth)    Progress Note Due on Visit 10    PT Start Time 1616    PT Stop Time 1700    PT Time Calculation (min) 44 min    Activity Tolerance Patient tolerated treatment well    Behavior During Therapy [facility] for tasks assessed/performed             Past Medical History:  Diagnosis Date   Arthritis    Cellulitis of right lower extremity 05/09/2014   Diabetes mellitus without complication (HCC)    Diabetic foot ulcer (HCC) 07/07/2014   Status post bedside debridement of multiple right plantar ulcers by podiatrist, Dr. Reynolds Bowl.   Hypercholesterolemia    Maggot infestation    right foot ulcer   Obesity 05/09/2014   Tobacco abuse 07/07/2014    Past Surgical History:  Procedure Laterality Date   APPENDECTOMY     PERIPHERAL VASCULAR CATHETERIZATION N/A 07/22/2015   Procedure: Abdominal Aortogram w/Lower Extremity;  Surgeon: Sherren Kerns, MD;  Location: [facility] INVASIVE CV LAB;  Service: Cardiovascular;  Laterality: N/A;    There were no vitals filed for this visit.               Wound Therapy - 09/22/20 1714     Subjective pt states the dressing slid down but he kept it on.    Patient and Family Stated Goals wounds to heal    Date of Onset 09/13/20    Prior Treatments self care    Pain Scale 0-10    Pain Score 0-No pain    Evaluation and Treatment Procedures Explained to Patient/Family Yes    Evaluation and Treatment Procedures agreed to    Wound Properties Date First Assessed: 09/20/20 Time First  Assessed: 1300 Wound Type: Diabetic ulcer Location: Heel Location Orientation: Right Wound Description (Comments): R lateral plantar wound Present on Admission: Yes   Wound Image Images linked: 1    Dressing Type Gauze (Comment)   medihoney colloid   Dressing Changed Changed    Dressing Status Old drainage    Dressing Change Frequency PRN    Site / Wound Assessment Yellow;Pink    % Wound base Red or Granulating 75%    % Wound base Yellow/Fibrinous Exudate 5%    Peri-wound Assessment Maceration   callous   Tunneling (cm) central wound with 0.8cm opening and at least 1cm depth    Drainage Amount Minimal    Drainage Description Serosanguineous    Treatment Cleansed;Debridement (Selective)    Selective Debridement - Location R plantar wound and periwound    Selective Debridement - Tools Used Forceps;Scalpel;Scissors    Selective Debridement - Tissue Removed callus, wound bed    Wound Therapy - Clinical Statement OVerall perimeter of wound and approximation of border has improved.  Discovered this session a 0.8cm slit in central wound that has at least 1cm in depth.  Cleansed this well and packed with hydrogel gauze prior to placing medihoney colloid over wound bed.  Able to remove more callous from perimeter to expose borders.  Pt tolerated well.  Cleansed and moisturized Rt LE well prior to redressing with profore lite.  Extra cotton used around ankle to promote compression and keep dressing form sliding down.    Wound Therapy - Functional Problem List dressing, bathing, walking    Factors Delaying/Impairing Wound Healing Altered sensation;Diabetes Mellitus;Multiple medical problems;Tobacco use;Vascular compromise    Hydrotherapy Plan Debridement;Dressing change;Electrical stimulation;Patient/family education;Pulsatile lavage with suction    Wound Therapy - Frequency 2X / week   6 weeks   Wound Therapy - Current Recommendations PT    Wound Plan cleanse, debride, dressing changes for wound healing     Dressing  vaseline/lotion, hydrogel gauze packed into central wound,  medihoney colloid, 4x4, profore lite, #5 netting, darco shoe for offweighting toes                     PT Education - 09/22/20 1722     Education Details instructed to contact MD regarding new shoes/pressure relief as his are over 89 years old.  Educated on compression garments and these have to be worn everyday.    Person(s) Educated Patient    Methods Explanation    Comprehension Verbalized understanding              PT Short Term Goals - 09/20/20 1356       PT SHORT TERM GOAL #1   Title Patient will be able to verbalize daily skin checks.    Time 3    Period Weeks    Status New    Target Date 10/11/20      PT SHORT TERM GOAL #2   Title Wounds to be 100% granulated to allow healing    Time 3    Period Weeks    Status New    Target Date 10/11/20      PT SHORT TERM GOAL #3   Title LE to no longer be indurated to allow improved healing    Time 3    Period Weeks    Status New    Target Date 10/11/20               PT Long Term Goals - 09/20/20 1357       PT LONG TERM GOAL #1   Title Paitent's wound will be healed.    Time 6    Period Weeks    Status New    Target Date 11/01/20      PT LONG TERM GOAL #2   Title Patient will wear compression garments daily and continue skin checks.    Time 6    Period Weeks    Status New    Target Date 11/01/20                    Patient will benefit from skilled therapeutic intervention in order to improve the following deficits and impairments:     Visit Diagnosis: Open wound of right foot, initial encounter     Problem List Patient Active Problem List   Diagnosis Date Noted   Closed fracture of proximal end of right humerus with routine healing 02/25/2019   Pre-ulcerative calluses 04/20/2018   Myositis 07/08/2014   Diabetic foot ulcer associated with type 2 diabetes mellitus (HCC) 07/07/2014   Tobacco abuse  07/07/2014   Infestation, maggots 07/07/2014   Plantar ulcer of right foot (HCC) 05/12/2014   Pulse weakness    Peripheral neuropathy  Cellulitis of right lower extremity 05/09/2014   Obesity 05/09/2014   Cellulitis of right lower leg 05/09/2014   Hyperglycemia 03/21/2013   IDDM (insulin dependent diabetes mellitus) 03/21/2013   Noncompliance with medications due to cost issues 03/21/2013   Lurena Nida, PTA/CLT [phone]  Lurena Nida 09/22/2020, 5:23 PM  [place] [facility] [address] [place], [place], [address] Phone: [phone]   Fax:  [phone]  Name: Mitchell [name] MRN: [id number] Date of Birth: [birth year]

## 2020-09-27 ENCOUNTER — Ambulatory Visit (HOSPITAL_COMMUNITY): Payer: Medicare HMO | Admitting: Physical Therapy

## 2020-09-27 ENCOUNTER — Other Ambulatory Visit: Payer: Self-pay

## 2020-09-27 DIAGNOSIS — R262 Difficulty in walking, not elsewhere classified: Secondary | ICD-10-CM | POA: Diagnosis not present

## 2020-09-27 DIAGNOSIS — S91301A Unspecified open wound, right foot, initial encounter: Secondary | ICD-10-CM | POA: Diagnosis not present

## 2020-09-27 NOTE — Therapy (Signed)
[facility] [address] [place], [place], [address] Phone: [phone]   Fax:  [phone]  Wound Care Therapy  Patient Details  Name: Mitchell [name] MRN: [id number] Date of Birth: [birth year] Referring Provider (PT): Kirstie Peri MD   Encounter Date: 09/27/2020   PT End of Session - 09/27/20 1655     Visit Number 3    Number of Visits 12    Date for PT Re-Evaluation 11/01/20    Authorization Type [employer]na Medicare  (no vl, no auth)    Progress Note Due on Visit 10    PT Start Time 1620    PT Stop Time 1650    PT Time Calculation (min) 30 min    Activity Tolerance Patient tolerated treatment well    Behavior During Therapy [facility] for tasks assessed/performed             Past Medical History:  Diagnosis Date   Arthritis    Cellulitis of right lower extremity 05/09/2014   Diabetes mellitus without complication (HCC)    Diabetic foot ulcer (HCC) 07/07/2014   Status post bedside debridement of multiple right plantar ulcers by podiatrist, Dr. Reynolds Bowl.   Hypercholesterolemia    Maggot infestation    right foot ulcer   Obesity 05/09/2014   Tobacco abuse 07/07/2014    Past Surgical History:  Procedure Laterality Date   APPENDECTOMY     PERIPHERAL VASCULAR CATHETERIZATION N/A 07/22/2015   Procedure: Abdominal Aortogram w/Lower Extremity;  Surgeon: Sherren Kerns, MD;  Location: [facility] INVASIVE CV LAB;  Service: Cardiovascular;  Laterality: N/A;    There were no vitals filed for this visit.               Wound Therapy - 09/27/20 1651     Subjective pt states his wound stinks today and it has really been draining.  No pain or issues    Patient and Family Stated Goals wounds to heal    Date of Onset 09/13/20    Prior Treatments self care    Pain Scale 0-10    Pain Score 0-No pain    Evaluation and Treatment Procedures Explained to Patient/Family Yes    Evaluation and Treatment Procedures agreed to    Wound Properties Date First  Assessed: 09/20/20 Time First Assessed: 1300 Wound Type: Diabetic ulcer Location: Heel Location Orientation: Right Wound Description (Comments): R lateral plantar wound Present on Admission: Yes   Dressing Type Gauze (Comment)    Dressing Changed Changed    Dressing Status Old drainage    Dressing Change Frequency PRN    Site / Wound Assessment Yellow;Pink    % Wound base Red or Granulating 75%    % Wound base Yellow/Fibrinous Exudate 25%    Peri-wound Assessment Maceration    Drainage Amount Moderate    Drainage Description Serosanguineous    Treatment Cleansed;Debridement (Selective)    Selective Debridement - Location R plantar wound and periwound    Selective Debridement - Tools Used Forceps;Scalpel;Scissors    Selective Debridement - Tissue Removed callus, wound bed    Wound Therapy - Clinical Statement Increased drainage that became purulent due to stagnant on dressings.  Cleansed wound well with debridement of perimeter and slough central wound.  Perimeter without redness or signs/symptoms of infection.  Foot does remain slightly swollen as well as LE despite use of profore.  Moisturzied well and changed dressing to silver hydrofiber due to increase in drainage.  Also added ABD for drainage.  Wound Therapy - Functional Problem List dressing, bathing, walking    Factors Delaying/Impairing Wound Healing Altered sensation;Diabetes Mellitus;Multiple medical problems;Tobacco use;Vascular compromise    Hydrotherapy Plan Debridement;Dressing change;Electrical stimulation;Patient/family education;Pulsatile lavage with suction    Wound Therapy - Frequency 2X / week   6 weeks   Wound Therapy - Current Recommendations PT    Wound Plan cleanse, debride, dressing changes for wound healing    Dressing  vaseline/lotion, silver hydrofiber packed into central wound, 4x4, ABD pad profore lite, #5 netting, darco shoe for offweighting toes                       PT Short Term Goals -  09/20/20 1356       PT SHORT TERM GOAL #1   Title Patient will be able to verbalize daily skin checks.    Time 3    Period Weeks    Status New    Target Date 10/11/20      PT SHORT TERM GOAL #2   Title Wounds to be 100% granulated to allow healing    Time 3    Period Weeks    Status New    Target Date 10/11/20      PT SHORT TERM GOAL #3   Title LE to no longer be indurated to allow improved healing    Time 3    Period Weeks    Status New    Target Date 10/11/20               PT Long Term Goals - 09/20/20 1357       PT LONG TERM GOAL #1   Title Paitent's wound will be healed.    Time 6    Period Weeks    Status New    Target Date 11/01/20      PT LONG TERM GOAL #2   Title Patient will wear compression garments daily and continue skin checks.    Time 6    Period Weeks    Status New    Target Date 11/01/20                    Patient will benefit from skilled therapeutic intervention in order to improve the following deficits and impairments:     Visit Diagnosis: Open wound of right foot, initial encounter  Difficulty in walking, not elsewhere classified     Problem List Patient Active Problem List   Diagnosis Date Noted   Closed fracture of proximal end of right humerus with routine healing 02/25/2019   Pre-ulcerative calluses 04/20/2018   Myositis 07/08/2014   Diabetic foot ulcer associated with type 2 diabetes mellitus (HCC) 07/07/2014   Tobacco abuse 07/07/2014   Infestation, maggots 07/07/2014   Plantar ulcer of right foot (HCC) 05/12/2014   Pulse weakness    Peripheral neuropathy    Cellulitis of right lower extremity 05/09/2014   Obesity 05/09/2014   Cellulitis of right lower leg 05/09/2014   Hyperglycemia 03/21/2013   IDDM (insulin dependent diabetes mellitus) 03/21/2013   Noncompliance with medications due to cost issues 03/21/2013   Lurena Nida, PTA/CLT [phone]  Lurena Nida 09/27/2020, 4:55 PM  [facility] [address] [place], [place], [address] Phone: [phone]   Fax:  [phone]  Name: Karsen Nakanishi MRN: [id number] Date of Birth: Jun 11, 1950

## 2020-09-28 ENCOUNTER — Ambulatory Visit (HOSPITAL_COMMUNITY): Payer: Medicare HMO | Admitting: Physical Therapy

## 2020-09-29 ENCOUNTER — Other Ambulatory Visit: Payer: Self-pay

## 2020-09-29 ENCOUNTER — Ambulatory Visit (HOSPITAL_COMMUNITY): Payer: Medicare HMO | Admitting: Physical Therapy

## 2020-09-29 DIAGNOSIS — S91301A Unspecified open wound, right foot, initial encounter: Secondary | ICD-10-CM | POA: Diagnosis not present

## 2020-09-29 DIAGNOSIS — R262 Difficulty in walking, not elsewhere classified: Secondary | ICD-10-CM | POA: Diagnosis not present

## 2020-09-29 NOTE — Therapy (Signed)
[facility] [address] [place], [place], [address] Phone: [phone]   Fax:  [phone]  Wound Care Therapy  Patient Details  Name: Mitchell [name] MRN: [id number] Date of Birth: 08/10/1950 Referring Provider (PT): Kirstie Peri MD   Encounter Date: 09/29/2020   PT End of Session - 09/29/20 1713     Visit Number 4    Number of Visits 12    Date for PT Re-Evaluation 11/01/20    Authorization Type [employer] Medicare  (no vl, no auth)    Progress Note Due on Visit 10    PT Start Time 1450    PT Stop Time 1530    PT Time Calculation (min) 40 min    Activity Tolerance Patient tolerated treatment well    Behavior During Therapy [facility] for tasks assessed/performed             Past Medical History:  Diagnosis Date   Arthritis    Cellulitis of right lower extremity 05/09/2014   Diabetes mellitus without complication (HCC)    Diabetic foot ulcer (HCC) 07/07/2014   Status post bedside debridement of multiple right plantar ulcers by podiatrist, Dr. Reynolds Bowl.   Hypercholesterolemia    Maggot infestation    right foot ulcer   Obesity 05/09/2014   Tobacco abuse 07/07/2014    Past Surgical History:  Procedure Laterality Date   APPENDECTOMY     PERIPHERAL VASCULAR CATHETERIZATION N/A 07/22/2015   Procedure: Abdominal Aortogram w/Lower Extremity;  Surgeon: Sherren Kerns, MD;  Location: [facility] INVASIVE CV LAB;  Service: Cardiovascular;  Laterality: N/A;    There were no vitals filed for this visit.               Wound Therapy - 09/29/20 1707     Subjective pt states it hasn't drained as bad but still has an odor    Patient and Family Stated Goals wounds to heal    Date of Onset 09/13/20    Prior Treatments self care    Pain Scale 0-10    Pain Score 0-No pain    Evaluation and Treatment Procedures Explained to Patient/Family Yes    Evaluation and Treatment Procedures agreed to    Wound Properties Date First Assessed: 09/20/20 Time First  Assessed: 1300 Wound Type: Diabetic ulcer Location: Heel Location Orientation: Right Wound Description (Comments): R lateral plantar wound Present on Admission: Yes   Wound Image Images linked: 1    Dressing Type Silver hydrofiber    Dressing Changed Changed    Dressing Status Old drainage    Dressing Change Frequency PRN    Site / Wound Assessment Red;Yellow    % Wound base Red or Granulating 75%    % Wound base Yellow/Fibrinous Exudate 25%    Wound Length (cm) 1.5 cm    Wound Width (cm) 2.2 cm    Wound Depth (cm) 0.8 cm    Wound Volume (cm^3) 2.64 cm^3    Wound Surface Area (cm^2) 3.3 cm^2    Tunneling (cm) central opening is now 0.4X0.6X0.8    Drainage Amount Moderate    Drainage Description Serosanguineous    Treatment Cleansed;Debridement (Selective)    Selective Debridement - Location R plantar wound and periwound    Selective Debridement - Tools Used Forceps;Scalpel;Scissors    Selective Debridement - Tissue Removed callus, wound bed    Wound Therapy - Clinical Statement PT returns today without any issues.  Wound photographed and measured.  Continued odor but not from wound  bed, just from drainage.  Appears may be visible bone at furthest depth of wound, however overall depth has decreased since last week.  Continued to debride perimeter of callous and devitalized tissue with overall improvement noted in wound size and integrity of perimeter. Overall size has increased, however feel this is due to debridement of tissue. Silver hydrofiber working better to control maceration and drainage coming through dressings.    Wound Therapy - Functional Problem List dressing, bathing, walking    Factors Delaying/Impairing Wound Healing Altered sensation;Diabetes Mellitus;Multiple medical problems;Tobacco use;Vascular compromise    Hydrotherapy Plan Debridement;Dressing change;Electrical stimulation;Patient/family education;Pulsatile lavage with suction    Wound Therapy - Frequency 2X / week   6  weeks   Wound Therapy - Current Recommendations PT    Wound Plan cleanse, debride, dressing changes for wound healing    Dressing  vaseline/lotion, silver hydrofiber packed into central wound, 4x4, ABD pad profore lite, #5 netting, darco shoe for offweighting toes                       PT Short Term Goals - 09/20/20 1356       PT SHORT TERM GOAL #1   Title Patient will be able to verbalize daily skin checks.    Time 3    Period Weeks    Status New    Target Date 10/11/20      PT SHORT TERM GOAL #2   Title Wounds to be 100% granulated to allow healing    Time 3    Period Weeks    Status New    Target Date 10/11/20      PT SHORT TERM GOAL #3   Title LE to no longer be indurated to allow improved healing    Time 3    Period Weeks    Status New    Target Date 10/11/20               PT Long Term Goals - 09/20/20 1357       PT LONG TERM GOAL #1   Title Paitent's wound will be healed.    Time 6    Period Weeks    Status New    Target Date 11/01/20      PT LONG TERM GOAL #2   Title Patient will wear compression garments daily and continue skin checks.    Time 6    Period Weeks    Status New    Target Date 11/01/20                    Patient will benefit from skilled therapeutic intervention in order to improve the following deficits and impairments:     Visit Diagnosis: Open wound of right foot, initial encounter  Difficulty in walking, not elsewhere classified     Problem List Patient Active Problem List   Diagnosis Date Noted   Closed fracture of proximal end of right humerus with routine healing 02/25/2019   Pre-ulcerative calluses 04/20/2018   Myositis 07/08/2014   Diabetic foot ulcer associated with type 2 diabetes mellitus (HCC) 07/07/2014   Tobacco abuse 07/07/2014   Infestation, maggots 07/07/2014   Plantar ulcer of right foot (HCC) 05/12/2014   Pulse weakness    Peripheral neuropathy    Cellulitis of right lower  extremity 05/09/2014   Obesity 05/09/2014   Cellulitis of right lower leg 05/09/2014   Hyperglycemia 03/21/2013   IDDM (insulin dependent diabetes mellitus) 03/21/2013  Noncompliance with medications due to cost issues 03/21/2013   Lurena Nida, PTA/CLT [phone]  Lurena Nida 09/29/2020, 5:13 PM  [facility] [address] [place], [place], [address] Phone: [phone]   Fax:  [phone]  Name: Tavious Griesinger MRN: [id number] Date of Birth: 04-30-1950

## 2020-09-30 ENCOUNTER — Ambulatory Visit (HOSPITAL_COMMUNITY): Payer: Medicare HMO | Admitting: Physical Therapy

## 2020-10-04 ENCOUNTER — Other Ambulatory Visit: Payer: Self-pay

## 2020-10-04 ENCOUNTER — Encounter (HOSPITAL_COMMUNITY): Payer: Self-pay | Admitting: Physical Therapy

## 2020-10-04 ENCOUNTER — Ambulatory Visit (HOSPITAL_COMMUNITY): Payer: Medicare HMO | Admitting: Physical Therapy

## 2020-10-04 DIAGNOSIS — R262 Difficulty in walking, not elsewhere classified: Secondary | ICD-10-CM

## 2020-10-04 DIAGNOSIS — S91301A Unspecified open wound, right foot, initial encounter: Secondary | ICD-10-CM

## 2020-10-04 NOTE — Therapy (Signed)
[facility] [address] [place], [place], [address] Phone: [phone]   Fax:  [phone]  Wound Care Therapy  Patient Details  Name: Chrles Selley MRN: [id number] Date of Birth: 1950/01/19 Referring Provider (PT): Kirstie Peri MD   Encounter Date: 10/04/2020   PT End of Session - 10/04/20 1404     Visit Number 5    Number of Visits 12    Date for PT Re-Evaluation 11/01/20    Authorization Type [employer] Medicare  (no vl, no auth)    Progress Note Due on Visit 10    PT Start Time 1315    PT Stop Time 1400    PT Time Calculation (min) 45 min    Activity Tolerance Patient tolerated treatment well    Behavior During Therapy [facility] for tasks assessed/performed             Past Medical History:  Diagnosis Date   Arthritis    Cellulitis of right lower extremity 05/09/2014   Diabetes mellitus without complication (HCC)    Diabetic foot ulcer (HCC) 07/07/2014   Status post bedside debridement of multiple right plantar ulcers by podiatrist, Dr. Reynolds Bowl.   Hypercholesterolemia    Maggot infestation    right foot ulcer   Obesity 05/09/2014   Tobacco abuse 07/07/2014    Past Surgical History:  Procedure Laterality Date   APPENDECTOMY     PERIPHERAL VASCULAR CATHETERIZATION N/A 07/22/2015   Procedure: Abdominal Aortogram w/Lower Extremity;  Surgeon: Sherren Kerns, MD;  Location: [facility] INVASIVE CV LAB;  Service: Cardiovascular;  Laterality: N/A;    There were no vitals filed for this visit.               Wound Therapy - 10/04/20 0001     Subjective Patient states his wound is leaky and stinks    Patient and Family Stated Goals wounds to heal    Date of Onset 09/13/20    Prior Treatments self care    Pain Score 0-No pain    Evaluation and Treatment Procedures Explained to Patient/Family Yes    Evaluation and Treatment Procedures agreed to    Wound Properties Date First Assessed: 09/20/20 Time First Assessed: 1300 Wound Type:  Diabetic ulcer Location: Heel Location Orientation: Right Wound Description (Comments): R lateral plantar wound Present on Admission: Yes   Dressing Type Silver hydrofiber;Compression wrap    Dressing Changed Changed    Dressing Status Old drainage    Dressing Change Frequency PRN    Site / Wound Assessment Red;Yellow    % Wound base Red or Granulating 75%    % Wound base Yellow/Fibrinous Exudate 25%    Tunneling (cm) central wound    Drainage Amount Moderate    Drainage Description Serosanguineous    Treatment Cleansed;Debridement (Selective)    Selective Debridement - Location R plantar wound and periwound    Selective Debridement - Tools Used Forceps;Scalpel;Scissors    Selective Debridement - Tissue Removed callus, wound bed    Wound Therapy - Clinical Statement Patient concerned of wound infection due to smell but smell likely coming from his leg and drainage. Patient with maceration at periwound due to continued drainage. Debrided periwound and wound bed with continued deepest spot in center of wound bed. Continued with silver hydrofiber to wound, 4x4, abd, and profore lite. Possibly perform pulsed lavage next session. Patient will continue benefit from skilled physical therapy in order to promote wound healing.    Wound Therapy - Functional Problem  List dressing, bathing, walking    Factors Delaying/Impairing Wound Healing Altered sensation;Diabetes Mellitus;Multiple medical problems;Tobacco use;Vascular compromise    Hydrotherapy Plan Debridement;Dressing change;Electrical stimulation;Patient/family education;Pulsatile lavage with suction    Wound Therapy - Frequency 2X / week   6 weeks   Wound Therapy - Current Recommendations PT    Wound Plan cleanse, debride, dressing changes for wound healing; possilby pulsed lavage next session    Dressing  vaseline/lotion, silver hydrofiber packed into central wound, 4x4, ABD pad profore lite, #5 netting, darco shoe for offweighting toes                        PT Short Term Goals - 09/20/20 1356       PT SHORT TERM GOAL #1   Title Patient will be able to verbalize daily skin checks.    Time 3    Period Weeks    Status New    Target Date 10/11/20      PT SHORT TERM GOAL #2   Title Wounds to be 100% granulated to allow healing    Time 3    Period Weeks    Status New    Target Date 10/11/20      PT SHORT TERM GOAL #3   Title LE to no longer be indurated to allow improved healing    Time 3    Period Weeks    Status New    Target Date 10/11/20               PT Long Term Goals - 09/20/20 1357       PT LONG TERM GOAL #1   Title Paitent's wound will be healed.    Time 6    Period Weeks    Status New    Target Date 11/01/20      PT LONG TERM GOAL #2   Title Patient will wear compression garments daily and continue skin checks.    Time 6    Period Weeks    Status New    Target Date 11/01/20                   Plan - 10/04/20 1404     Clinical Impression Statement see above    Personal Factors and Comorbidities Comorbidity 3+    Comorbidities smoker. DM, vascular compromise    Examination-Activity Limitations Bathing;Dressing;Locomotion Level    Examination-Participation Restrictions Yard Work;Shop    Stability/Clinical Decision Making Stable/Uncomplicated    Rehab Potential Good    PT Frequency 2x / week    PT Duration 6 weeks    PT Treatment/Interventions ADLs/Self Care Home Management;Gait training;Stair training;Functional mobility training;Therapeutic activities;Therapeutic exercise;Balance training;DME Instruction;Ultrasound;Neuromuscular re-education;Patient/family education;Compression bandaging;Manual lymph drainage;Manual techniques    PT Next Visit Plan debride and dressing changes to promote wound healing    Consulted and Agree with Plan of Care Patient             Patient will benefit from skilled therapeutic intervention in order to improve the following  deficits and impairments:  Decreased skin integrity, Difficulty walking, Abnormal gait, Impaired sensation, Decreased mobility  Visit Diagnosis: Open wound of right foot, initial encounter  Difficulty in walking, not elsewhere classified     Problem List Patient Active Problem List   Diagnosis Date Noted   Closed fracture of proximal end of right humerus with routine healing 02/25/2019   Pre-ulcerative calluses 04/20/2018   Myositis 07/08/2014   Diabetic foot ulcer associated with  type 2 diabetes mellitus (HCC) 07/07/2014   Tobacco abuse 07/07/2014   Infestation, maggots 07/07/2014   Plantar ulcer of right foot (HCC) 05/12/2014   Pulse weakness    Peripheral neuropathy    Cellulitis of right lower extremity 05/09/2014   Obesity 05/09/2014   Cellulitis of right lower leg 05/09/2014   Hyperglycemia 03/21/2013   IDDM (insulin dependent diabetes mellitus) 03/21/2013   Noncompliance with medications due to cost issues 03/21/2013   2:27 PM, 10/04/20 Wyman Songster PT, DPT Physical Therapist at [facility]    [facility] [address] [place], [place], [address] Phone: [phone]   Fax:  [phone]  Name: Ledarius Leeson MRN: [id number] Date of Birth: [birth year]

## 2020-10-06 ENCOUNTER — Other Ambulatory Visit: Payer: Self-pay

## 2020-10-06 ENCOUNTER — Ambulatory Visit (HOSPITAL_COMMUNITY): Payer: Medicare HMO | Admitting: Physical Therapy

## 2020-10-06 DIAGNOSIS — R262 Difficulty in walking, not elsewhere classified: Secondary | ICD-10-CM

## 2020-10-06 DIAGNOSIS — S91301A Unspecified open wound, right foot, initial encounter: Secondary | ICD-10-CM | POA: Diagnosis not present

## 2020-10-06 DIAGNOSIS — E1129 Type 2 diabetes mellitus with other diabetic kidney complication: Secondary | ICD-10-CM | POA: Diagnosis not present

## 2020-10-06 NOTE — Therapy (Signed)
[facility] [address] [place], [place], [address] Phone: [phone]   Fax:  [phone]  Wound Care Therapy  Patient Details  Name: Wolf Boulay MRN: [id number] Date of Birth: March 04, 1950 Referring Provider (PT): Kirstie Peri MD   Encounter Date: 10/06/2020   PT End of Session - 10/06/20 1301     Visit Number 6    Number of Visits 12    Date for PT Re-Evaluation 11/01/20    Authorization Type [employer]na Medicare  (no vl, no auth)    Progress Note Due on Visit 10    PT Start Time 1140    PT Stop Time 1210    PT Time Calculation (min) 30 min    Activity Tolerance Patient tolerated treatment well    Behavior During Therapy [facility] for tasks assessed/performed             Past Medical History:  Diagnosis Date   Arthritis    Cellulitis of right lower extremity 05/09/2014   Diabetes mellitus without complication (HCC)    Diabetic foot ulcer (HCC) 07/07/2014   Status post bedside debridement of multiple right plantar ulcers by podiatrist, Dr. Reynolds Bowl.   Hypercholesterolemia    Maggot infestation    right foot ulcer   Obesity 05/09/2014   Tobacco abuse 07/07/2014    Past Surgical History:  Procedure Laterality Date   APPENDECTOMY     PERIPHERAL VASCULAR CATHETERIZATION N/A 07/22/2015   Procedure: Abdominal Aortogram w/Lower Extremity;  Surgeon: Sherren Kerns, MD;  Location: [facility] INVASIVE CV LAB;  Service: Cardiovascular;  Laterality: N/A;    There were no vitals filed for this visit.               Wound Therapy - 10/06/20 0001     Subjective pt states it hasn't drained as bad but still has an odor    Patient and Family Stated Goals wounds to heal    Date of Onset 09/13/20    Prior Treatments self care    Evaluation and Treatment Procedures Explained to Patient/Family Yes    Evaluation and Treatment Procedures agreed to    Wound Properties Date First Assessed: 09/20/20 Time First Assessed: 1300 Wound Type: Diabetic ulcer  Location: Heel Location Orientation: Right Wound Description (Comments): R lateral plantar wound Present on Admission: Yes   Wound Image Images linked: 1    Dressing Type Silver hydrofiber    Dressing Changed Changed    Dressing Status Old drainage    Dressing Change Frequency PRN    Site / Wound Assessment Red;Yellow    % Wound base Red or Granulating 80%    % Wound base Yellow/Fibrinous Exudate 20%    Wound Length (cm) 1.5 cm   was 1.5   Wound Width (cm) 2 cm   was 2.2   Wound Depth (cm) 0.8 cm   was 0.8   Wound Volume (cm^3) 2.4 cm^3    Wound Surface Area (cm^2) 3 cm^2    Tunneling (cm) central now opened up to perimeter    Drainage Amount Moderate    Drainage Description Serosanguineous    Treatment Cleansed;Debridement (Selective)    Selective Debridement - Location R plantar wound and periwound    Selective Debridement - Tools Used Forceps;Scalpel;Scissors    Selective Debridement - Tissue Removed callus, wound bed    Wound Therapy - Clinical Statement Wound photographed and measured this session.  Continued odor but not from wound bed just stagnant drainage.  Small opening is  now opened all the way up to perimeter.  Little improvement in overall size, however increased granulation and approximation of wound.  Moisturized well and applied vaseline around perimeter to help reduce callous/maceration.  Continued use of profore lite to help reduce induration/edema and promote further healing of wound.  No signs/symptoms of infection present.    Wound Therapy - Functional Problem List dressing, bathing, walking    Factors Delaying/Impairing Wound Healing Altered sensation;Diabetes Mellitus;Multiple medical problems;Tobacco use;Vascular compromise    Hydrotherapy Plan Debridement;Dressing change;Electrical stimulation;Patient/family education;Pulsatile lavage with suction    Wound Therapy - Frequency 2X / week   6 weeks   Wound Therapy - Current Recommendations PT    Wound Plan cleanse,  debride, dressing changes for wound healing    Dressing  vaseline/lotion, silver hydrofiber packed into central wound, 4x4, ABD pad profore lite, #5 netting, darco shoe for offweighting toes                       PT Short Term Goals - 09/20/20 1356       PT SHORT TERM GOAL #1   Title Patient will be able to verbalize daily skin checks.    Time 3    Period Weeks    Status New    Target Date 10/11/20      PT SHORT TERM GOAL #2   Title Wounds to be 100% granulated to allow healing    Time 3    Period Weeks    Status New    Target Date 10/11/20      PT SHORT TERM GOAL #3   Title LE to no longer be indurated to allow improved healing    Time 3    Period Weeks    Status New    Target Date 10/11/20               PT Long Term Goals - 09/20/20 1357       PT LONG TERM GOAL #1   Title Paitent's wound will be healed.    Time 6    Period Weeks    Status New    Target Date 11/01/20      PT LONG TERM GOAL #2   Title Patient will wear compression garments daily and continue skin checks.    Time 6    Period Weeks    Status New    Target Date 11/01/20                    Patient will benefit from skilled therapeutic intervention in order to improve the following deficits and impairments:     Visit Diagnosis: Open wound of right foot, initial encounter  Difficulty in walking, not elsewhere classified     Problem List Patient Active Problem List   Diagnosis Date Noted   Closed fracture of proximal end of right humerus with routine healing 02/25/2019   Pre-ulcerative calluses 04/20/2018   Myositis 07/08/2014   Diabetic foot ulcer associated with type 2 diabetes mellitus (HCC) 07/07/2014   Tobacco abuse 07/07/2014   Infestation, maggots 07/07/2014   Plantar ulcer of right foot (HCC) 05/12/2014   Pulse weakness    Peripheral neuropathy    Cellulitis of right lower extremity 05/09/2014   Obesity 05/09/2014   Cellulitis of right lower leg  05/09/2014   Hyperglycemia 03/21/2013   IDDM (insulin dependent diabetes mellitus) 03/21/2013   Noncompliance with medications due to cost issues 03/21/2013   Lurena Nida, PTA/CLT  [phone]  Lurena Nida 10/06/2020, 1:02 PM  [facility] [address] [place], [place], [address] Phone: [phone]   Fax:  [phone]  Name: Javis Abboud MRN: [id number] Date of Birth: Oct 06, 1950

## 2020-10-11 ENCOUNTER — Other Ambulatory Visit: Payer: Self-pay

## 2020-10-11 ENCOUNTER — Ambulatory Visit (HOSPITAL_COMMUNITY): Payer: Medicare HMO | Admitting: Physical Therapy

## 2020-10-11 DIAGNOSIS — S91301A Unspecified open wound, right foot, initial encounter: Secondary | ICD-10-CM

## 2020-10-11 DIAGNOSIS — R262 Difficulty in walking, not elsewhere classified: Secondary | ICD-10-CM | POA: Diagnosis not present

## 2020-10-11 NOTE — Therapy (Signed)
[facility] [address] [place], [place], [address] Phone: [phone]   Fax:  [phone]  Wound Care Therapy  Patient Details  Name: Mitchell [name] MRN: [id number] Date of Birth: [birth year] Referring Provider (PT): Kirstie Peri MD   Encounter Date: 10/11/2020   PT End of Session - 10/11/20 1315     Visit Number 7    Number of Visits 12    Date for PT Re-Evaluation 11/01/20    Authorization Type [employer] Medicare  (no vl, no auth)    Progress Note Due on Visit 10    PT Start Time 1136    PT Stop Time 1210    PT Time Calculation (min) 34 min    Activity Tolerance Patient tolerated treatment well    Behavior During Therapy [name] for tasks assessed/performed             Past Medical History:  Diagnosis Date   Arthritis    Cellulitis of right lower extremity 05/09/2014   Diabetes mellitus without complication (HCC)    Diabetic foot ulcer (HCC) 07/07/2014   Status post bedside debridement of multiple right plantar ulcers by podiatrist, Dr. Reynolds Bowl.   Hypercholesterolemia    Maggot infestation    right foot ulcer   Obesity 05/09/2014   Tobacco abuse 07/07/2014    Past Surgical History:  Procedure Laterality Date   APPENDECTOMY     PERIPHERAL VASCULAR CATHETERIZATION N/A 07/22/2015   Procedure: Abdominal Aortogram w/Lower Extremity;  Surgeon: Sherren Kerns, MD;  Location: [facility] INVASIVE CV LAB;  Service: Cardiovascular;  Laterality: N/A;    There were no vitals filed for this visit.         Wound Therapy - 10/11/20 1309     Subjective pt states it hasn't drained as bad but still has an odor    Patient and Family Stated Goals wounds to heal    Date of Onset 09/13/20    Prior Treatments self care    Evaluation and Treatment Procedures Explained to Patient/Family Yes    Evaluation and Treatment Procedures agreed to    Wound Properties Date First Assessed: 09/20/20 Time First Assessed: 1300 Wound Type: Diabetic ulcer Location: Heel  Location Orientation: Right Wound Description (Comments): R lateral plantar wound Present on Admission: Yes   Dressing Type Silver hydrofiber    Dressing Changed Changed    Dressing Status Old drainage    Dressing Change Frequency PRN    Site / Wound Assessment Red;Yellow    % Wound base Red or Granulating 80%    % Wound base Yellow/Fibrinous Exudate 20%    Drainage Amount Moderate    Drainage Description Purulent;Serosanguineous    Treatment Hydrotherapy (Pulse lavage);Debridement (Selective);Cleansed    Selective Debridement - Location R plantar wound and periwound    Selective Debridement - Tools Used Forceps;Scalpel;Scissors    Selective Debridement - Tissue Removed callus, wound bed    Wound Therapy - Clinical Statement Wound with more drainage and some pseudomonas drainage as well.  Continued odor and some redness dorsum of foot.  Added pulse lavage today to help cleanse the wound more thoroughly and discussed with pateint request for antibiotics and wound culture to assist with healing.  Called MD office with request for both and faxed over referral.  Continued with debridement and dressing change using silver hydrofiber and profore lite.  Cleansed and moisturized LE well.    Wound Therapy - Functional Problem List dressing, bathing, walking    Factors Delaying/Impairing Wound  Healing Altered sensation;Diabetes Mellitus;Multiple medical problems;Tobacco use;Vascular compromise    Hydrotherapy Plan Debridement;Dressing change;Electrical stimulation;Patient/family education;Pulsatile lavage with suction    Wound Therapy - Frequency 2X / week   6 weeks   Wound Therapy - Current Recommendations PT    Wound Plan cleanse, debride, dressing changes for wound healing.  Complete wound culture if returned and F/U on antibiotic    Dressing  vaseline/lotion, silver hydrofiber packed into central wound, 4x4, ABD pad profore lite, #5 netting, darco shoe for offweighting toes                        PT Short Term Goals - 09/20/20 1356       PT SHORT TERM GOAL #1   Title Patient will be able to verbalize daily skin checks.    Time 3    Period Weeks    Status New    Target Date 10/11/20      PT SHORT TERM GOAL #2   Title Wounds to be 100% granulated to allow healing    Time 3    Period Weeks    Status New    Target Date 10/11/20      PT SHORT TERM GOAL #3   Title LE to no longer be indurated to allow improved healing    Time 3    Period Weeks    Status New    Target Date 10/11/20               PT Long Term Goals - 09/20/20 1357       PT LONG TERM GOAL #1   Title Paitent's wound will be healed.    Time 6    Period Weeks    Status New    Target Date 11/01/20      PT LONG TERM GOAL #2   Title Patient will wear compression garments daily and continue skin checks.    Time 6    Period Weeks    Status New    Target Date 11/01/20                    Patient will benefit from skilled therapeutic intervention in order to improve the following deficits and impairments:     Visit Diagnosis: Open wound of right foot, initial encounter     Problem List Patient Active Problem List   Diagnosis Date Noted   Closed fracture of proximal end of right humerus with routine healing 02/25/2019   Pre-ulcerative calluses 04/20/2018   Myositis 07/08/2014   Diabetic foot ulcer associated with type 2 diabetes mellitus (HCC) 07/07/2014   Tobacco abuse 07/07/2014   Infestation, maggots 07/07/2014   Plantar ulcer of right foot (HCC) 05/12/2014   Pulse weakness    Peripheral neuropathy    Cellulitis of right lower extremity 05/09/2014   Obesity 05/09/2014   Cellulitis of right lower leg 05/09/2014   Hyperglycemia 03/21/2013   IDDM (insulin dependent diabetes mellitus) 03/21/2013   Noncompliance with medications due to cost issues 03/21/2013   Lurena Nida, PTA/CLT [phone]  Lurena Nida 10/11/2020, 1:17 PM  [facility] [address] [place], [place], [address] Phone: [phone]   Fax:  [phone]  Name: Mitchell [name] MRN: [id number] Date of Birth: [birth year]

## 2020-10-13 ENCOUNTER — Other Ambulatory Visit: Payer: Self-pay

## 2020-10-13 ENCOUNTER — Other Ambulatory Visit (HOSPITAL_COMMUNITY)
Admission: RE | Admit: 2020-10-13 | Discharge: 2020-10-13 | Disposition: A | Discharge status: Home / Self Care | Payer: Medicare HMO | Source: Other Acute Inpatient Hospital | Attending: Internal Medicine | Admitting: Internal Medicine

## 2020-10-13 ENCOUNTER — Ambulatory Visit (HOSPITAL_COMMUNITY): Payer: Medicare HMO

## 2020-10-13 ENCOUNTER — Encounter (HOSPITAL_COMMUNITY): Payer: Self-pay

## 2020-10-13 DIAGNOSIS — S91301A Unspecified open wound, right foot, initial encounter: Secondary | ICD-10-CM | POA: Insufficient documentation

## 2020-10-13 DIAGNOSIS — L97509 Non-pressure chronic ulcer of other part of unspecified foot with unspecified severity: Secondary | ICD-10-CM | POA: Diagnosis not present

## 2020-10-13 DIAGNOSIS — R262 Difficulty in walking, not elsewhere classified: Secondary | ICD-10-CM | POA: Diagnosis not present

## 2020-10-13 DIAGNOSIS — E11621 Type 2 diabetes mellitus with foot ulcer: Secondary | ICD-10-CM | POA: Diagnosis not present

## 2020-10-13 DIAGNOSIS — Z6841 Body Mass Index (BMI) 40.0 and over, adult: Secondary | ICD-10-CM | POA: Diagnosis not present

## 2020-10-13 DIAGNOSIS — Z299 Encounter for prophylactic measures, unspecified: Secondary | ICD-10-CM | POA: Diagnosis not present

## 2020-10-13 DIAGNOSIS — L97519 Non-pressure chronic ulcer of other part of right foot with unspecified severity: Secondary | ICD-10-CM | POA: Diagnosis not present

## 2020-10-13 DIAGNOSIS — S98131A Complete traumatic amputation of one right lesser toe, initial encounter: Secondary | ICD-10-CM | POA: Diagnosis not present

## 2020-10-13 NOTE — Therapy (Signed)
[facility] [facility] [address] [place], [place], [address] Phone: [phone]   Fax:  [phone]  Wound Care Therapy  Patient Details  Name: Mitchell [name] MRN: [id number] Date of Birth: Apr 17, 1950 Referring Provider (PT): Kirstie Peri MD   Encounter Date: 10/13/2020   PT End of Session - 10/13/20 1315     Visit Number 8    Number of Visits 12    Date for PT Re-Evaluation 11/01/20    Authorization Type [employer] Medicare  (no vl, no auth)    Progress Note Due on Visit 10    PT Start Time 1138    PT Stop Time 1230    PT Time Calculation (min) 52 min    Activity Tolerance Patient tolerated treatment well    Behavior During Therapy [facility] for tasks assessed/performed             Past Medical History:  Diagnosis Date   Arthritis    Cellulitis of right lower extremity 05/09/2014   Diabetes mellitus without complication (HCC)    Diabetic foot ulcer (HCC) 07/07/2014   Status post bedside debridement of multiple right plantar ulcers by podiatrist, Dr. Reynolds Bowl.   Hypercholesterolemia    Maggot infestation    right foot ulcer   Obesity 05/09/2014   Tobacco abuse 07/07/2014    Past Surgical History:  Procedure Laterality Date   APPENDECTOMY     PERIPHERAL VASCULAR CATHETERIZATION N/A 07/22/2015   Procedure: Abdominal Aortogram w/Lower Extremity;  Surgeon: Sherren Kerns, MD;  Location: [facility] INVASIVE CV LAB;  Service: Cardiovascular;  Laterality: N/A;    There were no vitals filed for this visit.    Subjective Assessment - 10/13/20 1308     Subjective No reports of pain, stated he can smell foot.  Has not began antibiotics yet, plans to pick up later today.    Currently in Pain? No/denies                       Wound Therapy - 10/13/20 0001     Subjective No reports of pain, stated he can smell foot.  Has not began antibiotics yet, plans to pick up later today.    Patient and Family Stated Goals wounds to heal    Date of Onset  09/13/20    Prior Treatments self care    Pain Scale 0-10    Pain Score 0-No pain    Evaluation and Treatment Procedures Explained to Patient/Family Yes    Evaluation and Treatment Procedures agreed to    Wound Properties Date First Assessed: 09/20/20 Time First Assessed: 1300 Wound Type: Diabetic ulcer Location: Heel Location Orientation: Right Wound Description (Comments): R lateral plantar wound Present on Admission: Yes   Wound Image Images linked: 1    Dressing Type Silver hydrofiber;Compression wrap    Dressing Changed Changed    Dressing Status Old drainage    Dressing Change Frequency PRN    Site / Wound Assessment Red;Yellow    % Wound base Red or Granulating 80%    % Wound base Yellow/Fibrinous Exudate 20%    Drainage Amount Copious    Drainage Description Purulent;Serosanguineous    Treatment Hydrotherapy (Pulse lavage);Cleansed;Debridement (Selective)    Selective Debridement - Location R plantar wound and periwound    Selective Debridement - Tools Used Forceps;Scalpel;Scissors    Selective Debridement - Tissue Removed callus, wound bed    Wound Therapy - Clinical Statement Pseudomanas copious drainage through pt.'s socks with odor  present.  MD facetimed during session and aware of wound.  Signed referral for culture that was complete today.  All toes swollen as well, pt reports increased edema present.  Continued wiht silverhydrofiber to address drainage and profore lite for edema control.  Added toe wrap to address the edema as well.  Pt encouraged to get antibiotics and begin immediately.    Wound Therapy - Functional Problem List dressing, bathing, walking    Factors Delaying/Impairing Wound Healing Altered sensation;Diabetes Mellitus;Multiple medical problems;Tobacco use;Vascular compromise    Hydrotherapy Plan Debridement;Dressing change;Electrical stimulation;Patient/family education;Pulsatile lavage with suction    Wound Therapy - Frequency 2X / week   6 weeks   Wound  Therapy - Current Recommendations PT    Wound Plan cleanse, debride, dressing changes for wound healing.  Complete wound culture if returned and F/U on antibiotic    Dressing  vaseline/lotion, silver hydrofiber packed into central wound, 4x4, ABD pad profore lite, #5 netting, darco shoe for offweighting toes                       PT Short Term Goals - 09/20/20 1356       PT SHORT TERM GOAL #1   Title Patient will be able to verbalize daily skin checks.    Time 3    Period Weeks    Status New    Target Date 10/11/20      PT SHORT TERM GOAL #2   Title Wounds to be 100% granulated to allow healing    Time 3    Period Weeks    Status New    Target Date 10/11/20      PT SHORT TERM GOAL #3   Title LE to no longer be indurated to allow improved healing    Time 3    Period Weeks    Status New    Target Date 10/11/20               PT Long Term Goals - 09/20/20 1357       PT LONG TERM GOAL #1   Title Paitent's wound will be healed.    Time 6    Period Weeks    Status New    Target Date 11/01/20      PT LONG TERM GOAL #2   Title Patient will wear compression garments daily and continue skin checks.    Time 6    Period Weeks    Status New    Target Date 11/01/20                    Patient will benefit from skilled therapeutic intervention in order to improve the following deficits and impairments:     Visit Diagnosis: Open wound of right foot, initial encounter  Difficulty in walking, not elsewhere classified     Problem List Patient Active Problem List   Diagnosis Date Noted   Closed fracture of proximal end of right humerus with routine healing 02/25/2019   Pre-ulcerative calluses 04/20/2018   Myositis 07/08/2014   Diabetic foot ulcer associated with type 2 diabetes mellitus (HCC) 07/07/2014   Tobacco abuse 07/07/2014   Infestation, maggots 07/07/2014   Plantar ulcer of right foot (HCC) 05/12/2014   Pulse weakness     Peripheral neuropathy    Cellulitis of right lower extremity 05/09/2014   Obesity 05/09/2014   Cellulitis of right lower leg 05/09/2014   Hyperglycemia 03/21/2013   IDDM (insulin dependent diabetes mellitus)  03/21/2013   Noncompliance with medications due to cost issues 03/21/2013   Becky Sax, LPTA/CLT; CBIS [phone]  Juel Burrow 10/13/2020, 1:16 PM  [place] [facility] [address] [place], [place], [address] Phone: [phone]   Fax:  [phone]  Name: Ewan Grau MRN: [id number] Date of Birth: [birth year]

## 2020-10-16 LAB — AEROBIC CULTURE W GRAM STAIN (SUPERFICIAL SPECIMEN)

## 2020-10-18 ENCOUNTER — Other Ambulatory Visit: Payer: Self-pay

## 2020-10-18 ENCOUNTER — Ambulatory Visit (HOSPITAL_COMMUNITY): Payer: Medicare HMO | Attending: Internal Medicine

## 2020-10-18 ENCOUNTER — Encounter (HOSPITAL_COMMUNITY): Payer: Self-pay

## 2020-10-18 DIAGNOSIS — R262 Difficulty in walking, not elsewhere classified: Secondary | ICD-10-CM | POA: Insufficient documentation

## 2020-10-18 DIAGNOSIS — S91301A Unspecified open wound, right foot, initial encounter: Secondary | ICD-10-CM | POA: Diagnosis not present

## 2020-10-18 NOTE — Therapy (Signed)
[facility] [address] [place], [place], [address] Phone: [phone]   Fax:  [phone]  Physical Therapy Treatment  Patient Details  Name: Mitchell [name] MRN: [id number] Date of Birth: [birth year] Referring Provider (PT): Kirstie Peri MD   Encounter Date: 10/18/2020   PT End of Session - 10/18/20 1301     Visit Number 9    Number of Visits 12    Date for PT Re-Evaluation 11/01/20    Authorization Type [employer]na Medicare  (no vl, no auth)    Progress Note Due on Visit 10    PT Start Time 1140    PT Stop Time 1230    PT Time Calculation (min) 50 min    Activity Tolerance Patient tolerated treatment well    Behavior During Therapy [facility] for tasks assessed/performed             Past Medical History:  Diagnosis Date   Arthritis    Cellulitis of right lower extremity 05/09/2014   Diabetes mellitus without complication (HCC)    Diabetic foot ulcer (HCC) 07/07/2014   Status post bedside debridement of multiple right plantar ulcers by podiatrist, Dr. Reynolds Bowl.   Hypercholesterolemia    Maggot infestation    right foot ulcer   Obesity 05/09/2014   Tobacco abuse 07/07/2014    Past Surgical History:  Procedure Laterality Date   APPENDECTOMY     PERIPHERAL VASCULAR CATHETERIZATION N/A 07/22/2015   Procedure: Abdominal Aortogram w/Lower Extremity;  Surgeon: Sherren Kerns, MD;  Location: [facility] INVASIVE CV LAB;  Service: Cardiovascular;  Laterality: N/A;    There were no vitals filed for this visit.   Subjective Assessment - 10/18/20 1253     Subjective Pt stated he has started antibiotics.  Foot continues to drain through socks with odor present.    Currently in Pain? No/denies                             Wound Therapy - 10/18/20 0001     Subjective Pt stated he has started antibiotics.  Foot continues to drain through socks with odor present.    Patient and Family Stated Goals wounds to heal    Date of Onset 09/13/20     Prior Treatments self care    Pain Scale 0-10    Pain Score 0-No pain    Evaluation and Treatment Procedures Explained to Patient/Family Yes    Evaluation and Treatment Procedures agreed to    Wound Properties Date First Assessed: 09/20/20 Time First Assessed: 1300 Wound Type: Diabetic ulcer Location: Heel Location Orientation: Right Wound Description (Comments): R lateral plantar wound Present on Admission: Yes   Wound Image Images linked: 1    Dressing Type Silver hydrofiber;Alginate;Abdominal pads;Compression wrap   silver hydrofiber, alginate, ABD pad, medipore tape and profore lite   Dressing Changed Changed    Dressing Status Old drainage    Dressing Change Frequency PRN    Site / Wound Assessment Red;Yellow    % Wound base Red or Granulating 70%    % Wound base Yellow/Fibrinous Exudate 30%    Wound Length (cm) 1.4 cm    Wound Width (cm) 2 cm    Wound Depth (cm) 4 cm    Wound Volume (cm^3) 11.2 cm^3    Wound Surface Area (cm^2) 2.8 cm^2    Drainage Amount Copious    Drainage Description Purulent;Serosanguineous;Odor    Treatment Hydrotherapy (Pulse lavage);Debridement (Selective);Cleansed  Pulsed lavage therapy - wound location Rt plantar surface    Pulsed Lavage with Suction (psi) 4 psi    Pulsed Lavage with Suction - Normal Saline Used 1000 mL    Pulsed Lavage Tip Tip with splash shield    Selective Debridement - Location R plantar wound and periwound    Selective Debridement - Tools Used Forceps;Scalpel;Scissors    Selective Debridement - Tissue Removed callus, wound bed    Wound Therapy - Clinical Statement Pt arrived with wound exposed due to sliding up foot, increased maceration, copious drainage with odor present.  Therapist removed macerated tissue and callous perimeter.  Used PLS for deep cleansing of wound.  Packed wound with silver hydrofiber, added alginate and ABD to address drainage.  Measurements complete this session with noted vast increased in depth.  Reports of  comfort at EOS.    Wound Therapy - Functional Problem List dressing, bathing, walking    Factors Delaying/Impairing Wound Healing Altered sensation;Diabetes Mellitus;Multiple medical problems;Tobacco use;Vascular compromise    Hydrotherapy Plan Debridement;Dressing change;Electrical stimulation;Patient/family education;Pulsatile lavage with suction    Wound Therapy - Frequency 2X / week   6 weeks   Wound Therapy - Current Recommendations PT    Wound Plan 10th visit progress note,  cleanse, debride, dressing changes for wound healing.    Dressing  vaseline/lotion, silver hydrofiber packed into central wound, 4x4, ABD pad profore lite, #5 netting, darco shoe for offweighting toes                         PT Short Term Goals - 09/20/20 1356       PT SHORT TERM GOAL #1   Title Patient will be able to verbalize daily skin checks.    Time 3    Period Weeks    Status New    Target Date 10/11/20      PT SHORT TERM GOAL #2   Title Wounds to be 100% granulated to allow healing    Time 3    Period Weeks    Status New    Target Date 10/11/20      PT SHORT TERM GOAL #3   Title LE to no longer be indurated to allow improved healing    Time 3    Period Weeks    Status New    Target Date 10/11/20               PT Long Term Goals - 09/20/20 1357       PT LONG TERM GOAL #1   Title Paitent's wound will be healed.    Time 6    Period Weeks    Status New    Target Date 11/01/20      PT LONG TERM GOAL #2   Title Patient will wear compression garments daily and continue skin checks.    Time 6    Period Weeks    Status New    Target Date 11/01/20                    Patient will benefit from skilled therapeutic intervention in order to improve the following deficits and impairments:     Visit Diagnosis: Open wound of right foot, initial encounter  Difficulty in walking, not elsewhere classified     Problem List Patient Active Problem List    Diagnosis Date Noted   Closed fracture of proximal end of right humerus with routine healing 02/25/2019  Pre-ulcerative calluses 04/20/2018   Myositis 07/08/2014   Diabetic foot ulcer associated with type 2 diabetes mellitus (HCC) 07/07/2014   Tobacco abuse 07/07/2014   Infestation, maggots 07/07/2014   Plantar ulcer of right foot (HCC) 05/12/2014   Pulse weakness    Peripheral neuropathy    Cellulitis of right lower extremity 05/09/2014   Obesity 05/09/2014   Cellulitis of right lower leg 05/09/2014   Hyperglycemia 03/21/2013   IDDM (insulin dependent diabetes mellitus) 03/21/2013   Noncompliance with medications due to cost issues 03/21/2013   Becky Sax, LPTA/CLT; CBIS [phone]  Juel Burrow, PTA 10/18/2020, 1:04 PM  [place] [facility] [address] [place], [place], [address] Phone: [phone]   Fax:  [phone]  Name: Jariah Jarmon MRN: [id number] Date of Birth: 08-18-1950

## 2020-10-20 ENCOUNTER — Other Ambulatory Visit: Payer: Self-pay

## 2020-10-20 ENCOUNTER — Telehealth (HOSPITAL_COMMUNITY): Payer: Self-pay

## 2020-10-20 ENCOUNTER — Ambulatory Visit (HOSPITAL_COMMUNITY): Payer: Medicare HMO | Admitting: Physical Therapy

## 2020-10-20 ENCOUNTER — Other Ambulatory Visit (HOSPITAL_COMMUNITY): Payer: Self-pay | Admitting: Internal Medicine

## 2020-10-20 DIAGNOSIS — M79671 Pain in right foot: Secondary | ICD-10-CM

## 2020-10-20 DIAGNOSIS — R262 Difficulty in walking, not elsewhere classified: Secondary | ICD-10-CM

## 2020-10-20 DIAGNOSIS — S91301A Unspecified open wound, right foot, initial encounter: Secondary | ICD-10-CM

## 2020-10-20 NOTE — Therapy (Addendum)
[facility] [address] [place], [place], [address] Phone: [phone]   Fax:  [phone]  Wound Care Therapy/ Recert Progress Note Reporting Period 09/20/2020 to 10/20/2020  See note below for Objective Data and Assessment of Progress/Goals.   3:04 PM, 10/21/20 Wyman Songster PT, DPT Physical Therapist at [facility]    Patient Details  Name: Mitchell [name] MRN: [id number] Date of Birth: Oct 29, 1950 Referring Provider (PT): Kirstie Peri MD   Encounter Date: 10/20/2020   PT End of Session - 10/20/20 1336     Visit Number 10    Number of Visits 30    Date for PT Re-Evaluation 12/01/20    Authorization Type [employer] Medicare  (no vl, no auth)    Progress Note Due on Visit 20    PT Start Time 1045    PT Stop Time 1130    PT Time Calculation (min) 45 min    Activity Tolerance Patient tolerated treatment well    Behavior During Therapy [facility] for tasks assessed/performed             Past Medical History:  Diagnosis Date   Arthritis    Cellulitis of right lower extremity 05/09/2014   Diabetes mellitus without complication (HCC)    Diabetic foot ulcer (HCC) 07/07/2014   Status post bedside debridement of multiple right plantar ulcers by podiatrist, Dr. Reynolds Bowl.   Hypercholesterolemia    Maggot infestation    right foot ulcer   Obesity 05/09/2014   Tobacco abuse 07/07/2014    Past Surgical History:  Procedure Laterality Date   APPENDECTOMY     PERIPHERAL VASCULAR CATHETERIZATION N/A 07/22/2015   Procedure: Abdominal Aortogram w/Lower Extremity;  Surgeon: Sherren Kerns, MD;  Location: [facility] INVASIVE CV LAB;  Service: Cardiovascular;  Laterality: N/A;    There were no vitals filed for this visit.    Subjective Assessment - 10/20/20 1326     Subjective Pt has been on antibiotics for one week now.  No pain or issues reported other than the odor from his wound.    Currently in Pain? No/denies                        Wound Therapy - 10/20/20 1327     Subjective on Bactram antibiotic.  Continued drainage and odor from wound.    Patient and Family Stated Goals wounds to heal    Date of Onset 09/13/20    Prior Treatments self care    Pain Scale 0-10    Pain Score 0-No pain    Evaluation and Treatment Procedures Explained to Patient/Family Yes    Evaluation and Treatment Procedures agreed to    Wound Properties Date First Assessed: 09/20/20 Time First Assessed: 1300 Wound Type: Diabetic ulcer Location: Heel Location Orientation: Right Wound Description (Comments): R lateral plantar wound Present on Admission: Yes   Wound Image Images linked: 1    Dressing Type Silver hydrofiber    Dressing Changed Changed    Dressing Status Old drainage    Dressing Change Frequency Monday, Wednesday, Friday    Site / Wound Assessment Red;Yellow    % Wound base Red or Granulating --   unknown inside wound   Wound Length (cm) 1.8 cm   was 1.5   Wound Width (cm) 0.8 cm   was 1.6   Wound Depth (cm) 4 cm   was1.2   Wound Volume (cm^3) 5.76 cm^3  Wound Surface Area (cm^2) 1.44 cm^2    Undermining (cm) perimeter of wound with bone exposed    Drainage Amount Copious    Drainage Description Purulent;Odor;Serosanguineous    Treatment Hydrotherapy (Pulse lavage);Debridement (Selective)    Pulsed lavage therapy - wound location Rt plantar surface    Pulsed Lavage with Suction (psi) 4 psi    Pulsed Lavage with Suction - Normal Saline Used 1000 mL    Pulsed Lavage Tip Tip with splash shield    Selective Debridement - Location R plantar wound and periwound    Selective Debridement - Tools Used Forceps;Scalpel;Scissors    Selective Debridement - Tissue Removed callus, wound bed    Wound Therapy - Clinical Statement Odor and drainage continue to increase despite antibiotics and use of silver dressings.  There is bone exposure and increased depth as well.  Therpist called MD's office with concerns regarding  possible re-evaluation of current antibiotic (cultures taken last week), possible xray to determine if there is any bone infection and a possible surgical consult.  Expressed this to nursing who messaged MD right aaway.  Will await comminication from MD.  Pt will need to be increased to 3X week moving forward in light of increased drainage and concerns.    Wound Therapy - Functional Problem List dressing, bathing, walking    Factors Delaying/Impairing Wound Healing Altered sensation;Diabetes Mellitus;Multiple medical problems;Tobacco use;Vascular compromise    Hydrotherapy Plan Debridement;Dressing change;Electrical stimulation;Patient/family education;Pulsatile lavage with suction    Wound Therapy - Frequency 3X / week   6 weeks   Wound Therapy - Current Recommendations PT    Wound Plan continue with pulsed lavage to thoroughly irrigate wound depth, appropriate dressing conducive to environment.  Follow up with MD communication regarding antibiotics, surgical consult and xrays.  Instruct patient to change his appointments to Mon/Wed/Fri due to increased frequency need/drainage.    Dressing  vaseline silver hydrofiber packed into central wound, 4x4, ABD pad profore lite, #5 netting, darco shoe for offweighting toes                       PT Short Term Goals - 09/20/20 1356       PT SHORT TERM GOAL #1   Title Patient will be able to verbalize daily skin checks.    Time 3    Period Weeks    Status New    Target Date 10/11/20      PT SHORT TERM GOAL #2   Title Wounds to be 100% granulated to allow healing    Time 3    Period Weeks    Status New    Target Date 10/11/20      PT SHORT TERM GOAL #3   Title LE to no longer be indurated to allow improved healing    Time 3    Period Weeks    Status New    Target Date 10/11/20               PT Long Term Goals - 09/20/20 1357       PT LONG TERM GOAL #1   Title Paitent's wound will be healed.    Time 6    Period Weeks     Status New    Target Date 11/01/20      PT LONG TERM GOAL #2   Title Patient will wear compression garments daily and continue skin checks.    Time 6    Period Weeks    Status  New    Target Date 11/01/20                    Patient will benefit from skilled therapeutic intervention in order to improve the following deficits and impairments:     Visit Diagnosis: Open wound of right foot, initial encounter  Difficulty in walking, not elsewhere classified     Problem List Patient Active Problem List   Diagnosis Date Noted   Closed fracture of proximal end of right humerus with routine healing 02/25/2019   Pre-ulcerative calluses 04/20/2018   Myositis 07/08/2014   Diabetic foot ulcer associated with type 2 diabetes mellitus (HCC) 07/07/2014   Tobacco abuse 07/07/2014   Infestation, maggots 07/07/2014   Plantar ulcer of right foot (HCC) 05/12/2014   Pulse weakness    Peripheral neuropathy    Cellulitis of right lower extremity 05/09/2014   Obesity 05/09/2014   Cellulitis of right lower leg 05/09/2014   Hyperglycemia 03/21/2013   IDDM (insulin dependent diabetes mellitus) 03/21/2013   Noncompliance with medications due to cost issues 03/21/2013   Lurena Nida, PTA/CLT, Margarita Rana [phone]  Lurena Nida 10/20/2020, 1:38 PM  [facility] [address] [place], [place], [address] Phone: [phone]   Fax:  [phone]  Name: Mitchell [name] MRN: [id number] Date of Birth: 11-12-1950

## 2020-10-20 NOTE — Telephone Encounter (Signed)
Called Dr Margaretmary Eddy office informing pt continues to have wound odor and to assure correct antibiotics for culture lab results.  Becky Sax, LPTA/CLT; Rowe Clack [phone]

## 2020-10-21 NOTE — Addendum Note (Signed)
Addended by: Wyman Songster on: 10/21/2020 03:06 PM   Modules accepted: Orders

## 2020-10-25 ENCOUNTER — Encounter (HOSPITAL_COMMUNITY): Payer: Self-pay | Admitting: Physical Therapy

## 2020-10-25 ENCOUNTER — Encounter (HOSPITAL_COMMUNITY): Payer: Self-pay | Admitting: *Deleted

## 2020-10-25 ENCOUNTER — Emergency Department (HOSPITAL_COMMUNITY): Payer: Medicare HMO

## 2020-10-25 ENCOUNTER — Other Ambulatory Visit: Payer: Self-pay

## 2020-10-25 ENCOUNTER — Inpatient Hospital Stay (HOSPITAL_COMMUNITY)
Admission: EM | Admit: 2020-10-25 | Discharge: 2020-11-04 | DRG: 617 | Disposition: A | Discharge status: Home / Self Care | Payer: Medicare HMO | Attending: Internal Medicine | Admitting: Internal Medicine

## 2020-10-25 ENCOUNTER — Ambulatory Visit (HOSPITAL_COMMUNITY): Payer: Medicare HMO | Admitting: Physical Therapy

## 2020-10-25 DIAGNOSIS — B952 Enterococcus as the cause of diseases classified elsewhere: Secondary | ICD-10-CM | POA: Diagnosis not present

## 2020-10-25 DIAGNOSIS — K31811 Angiodysplasia of stomach and duodenum with bleeding: Secondary | ICD-10-CM | POA: Diagnosis not present

## 2020-10-25 DIAGNOSIS — R262 Difficulty in walking, not elsewhere classified: Secondary | ICD-10-CM

## 2020-10-25 DIAGNOSIS — E7800 Pure hypercholesterolemia, unspecified: Secondary | ICD-10-CM | POA: Diagnosis not present

## 2020-10-25 DIAGNOSIS — E1140 Type 2 diabetes mellitus with diabetic neuropathy, unspecified: Secondary | ICD-10-CM | POA: Diagnosis not present

## 2020-10-25 DIAGNOSIS — M19071 Primary osteoarthritis, right ankle and foot: Secondary | ICD-10-CM | POA: Diagnosis not present

## 2020-10-25 DIAGNOSIS — K31819 Angiodysplasia of stomach and duodenum without bleeding: Secondary | ICD-10-CM | POA: Diagnosis present

## 2020-10-25 DIAGNOSIS — Z7982 Long term (current) use of aspirin: Secondary | ICD-10-CM | POA: Diagnosis not present

## 2020-10-25 DIAGNOSIS — K922 Gastrointestinal hemorrhage, unspecified: Secondary | ICD-10-CM | POA: Diagnosis not present

## 2020-10-25 DIAGNOSIS — E11628 Type 2 diabetes mellitus with other skin complications: Secondary | ICD-10-CM | POA: Diagnosis not present

## 2020-10-25 DIAGNOSIS — M609 Myositis, unspecified: Secondary | ICD-10-CM | POA: Diagnosis present

## 2020-10-25 DIAGNOSIS — F17210 Nicotine dependence, cigarettes, uncomplicated: Secondary | ICD-10-CM | POA: Diagnosis present

## 2020-10-25 DIAGNOSIS — E1151 Type 2 diabetes mellitus with diabetic peripheral angiopathy without gangrene: Secondary | ICD-10-CM | POA: Diagnosis not present

## 2020-10-25 DIAGNOSIS — M868X7 Other osteomyelitis, ankle and foot: Secondary | ICD-10-CM | POA: Diagnosis not present

## 2020-10-25 DIAGNOSIS — I10 Essential (primary) hypertension: Secondary | ICD-10-CM | POA: Diagnosis not present

## 2020-10-25 DIAGNOSIS — R69 Illness, unspecified: Secondary | ICD-10-CM | POA: Diagnosis not present

## 2020-10-25 DIAGNOSIS — E669 Obesity, unspecified: Secondary | ICD-10-CM | POA: Diagnosis present

## 2020-10-25 DIAGNOSIS — L97519 Non-pressure chronic ulcer of other part of right foot with unspecified severity: Secondary | ICD-10-CM | POA: Diagnosis present

## 2020-10-25 DIAGNOSIS — K921 Melena: Secondary | ICD-10-CM | POA: Diagnosis not present

## 2020-10-25 DIAGNOSIS — M7989 Other specified soft tissue disorders: Secondary | ICD-10-CM | POA: Diagnosis not present

## 2020-10-25 DIAGNOSIS — D500 Iron deficiency anemia secondary to blood loss (chronic): Secondary | ICD-10-CM | POA: Diagnosis not present

## 2020-10-25 DIAGNOSIS — L03115 Cellulitis of right lower limb: Secondary | ICD-10-CM | POA: Diagnosis not present

## 2020-10-25 DIAGNOSIS — I739 Peripheral vascular disease, unspecified: Secondary | ICD-10-CM

## 2020-10-25 DIAGNOSIS — M86171 Other acute osteomyelitis, right ankle and foot: Secondary | ICD-10-CM | POA: Diagnosis not present

## 2020-10-25 DIAGNOSIS — E871 Hypo-osmolality and hyponatremia: Secondary | ICD-10-CM | POA: Diagnosis present

## 2020-10-25 DIAGNOSIS — L97412 Non-pressure chronic ulcer of right heel and midfoot with fat layer exposed: Secondary | ICD-10-CM

## 2020-10-25 DIAGNOSIS — B999 Unspecified infectious disease: Secondary | ICD-10-CM | POA: Diagnosis not present

## 2020-10-25 DIAGNOSIS — L97509 Non-pressure chronic ulcer of other part of unspecified foot with unspecified severity: Secondary | ICD-10-CM | POA: Diagnosis present

## 2020-10-25 DIAGNOSIS — Z9049 Acquired absence of other specified parts of digestive tract: Secondary | ICD-10-CM

## 2020-10-25 DIAGNOSIS — E1165 Type 2 diabetes mellitus with hyperglycemia: Secondary | ICD-10-CM | POA: Diagnosis not present

## 2020-10-25 DIAGNOSIS — S91301A Unspecified open wound, right foot, initial encounter: Secondary | ICD-10-CM

## 2020-10-25 DIAGNOSIS — L02611 Cutaneous abscess of right foot: Secondary | ICD-10-CM | POA: Diagnosis not present

## 2020-10-25 DIAGNOSIS — T8131XA Disruption of external operation (surgical) wound, not elsewhere classified, initial encounter: Secondary | ICD-10-CM | POA: Diagnosis not present

## 2020-10-25 DIAGNOSIS — E11621 Type 2 diabetes mellitus with foot ulcer: Secondary | ICD-10-CM | POA: Diagnosis present

## 2020-10-25 DIAGNOSIS — Z20822 Contact with and (suspected) exposure to covid-19: Secondary | ICD-10-CM | POA: Diagnosis present

## 2020-10-25 DIAGNOSIS — D638 Anemia in other chronic diseases classified elsewhere: Secondary | ICD-10-CM | POA: Diagnosis not present

## 2020-10-25 DIAGNOSIS — E1169 Type 2 diabetes mellitus with other specified complication: Secondary | ICD-10-CM | POA: Diagnosis not present

## 2020-10-25 DIAGNOSIS — Z794 Long term (current) use of insulin: Secondary | ICD-10-CM

## 2020-10-25 DIAGNOSIS — R531 Weakness: Secondary | ICD-10-CM | POA: Diagnosis not present

## 2020-10-25 DIAGNOSIS — Z7984 Long term (current) use of oral hypoglycemic drugs: Secondary | ICD-10-CM

## 2020-10-25 DIAGNOSIS — E1129 Type 2 diabetes mellitus with other diabetic kidney complication: Secondary | ICD-10-CM | POA: Diagnosis not present

## 2020-10-25 DIAGNOSIS — L089 Local infection of the skin and subcutaneous tissue, unspecified: Secondary | ICD-10-CM | POA: Diagnosis not present

## 2020-10-25 DIAGNOSIS — K3189 Other diseases of stomach and duodenum: Secondary | ICD-10-CM | POA: Diagnosis not present

## 2020-10-25 DIAGNOSIS — G629 Polyneuropathy, unspecified: Secondary | ICD-10-CM

## 2020-10-25 DIAGNOSIS — I96 Gangrene, not elsewhere classified: Secondary | ICD-10-CM | POA: Diagnosis not present

## 2020-10-25 DIAGNOSIS — D62 Acute posthemorrhagic anemia: Secondary | ICD-10-CM | POA: Diagnosis not present

## 2020-10-25 DIAGNOSIS — Z880 Allergy status to penicillin: Secondary | ICD-10-CM

## 2020-10-25 DIAGNOSIS — Z79899 Other long term (current) drug therapy: Secondary | ICD-10-CM | POA: Diagnosis not present

## 2020-10-25 DIAGNOSIS — M869 Osteomyelitis, unspecified: Secondary | ICD-10-CM | POA: Diagnosis not present

## 2020-10-25 DIAGNOSIS — G546 Phantom limb syndrome with pain: Secondary | ICD-10-CM | POA: Diagnosis not present

## 2020-10-25 DIAGNOSIS — E11649 Type 2 diabetes mellitus with hypoglycemia without coma: Secondary | ICD-10-CM | POA: Diagnosis not present

## 2020-10-25 DIAGNOSIS — Z6834 Body mass index (BMI) 34.0-34.9, adult: Secondary | ICD-10-CM | POA: Diagnosis not present

## 2020-10-25 DIAGNOSIS — G8918 Other acute postprocedural pain: Secondary | ICD-10-CM | POA: Diagnosis not present

## 2020-10-25 DIAGNOSIS — Z720 Tobacco use: Secondary | ICD-10-CM | POA: Diagnosis not present

## 2020-10-25 LAB — VITAMIN B12: Vitamin B-12: 265 pg/mL (ref 180–914)

## 2020-10-25 LAB — CBC WITH DIFFERENTIAL/PLATELET
Abs Immature Granulocytes: 0.03 10*3/uL (ref 0.00–0.07)
Basophils Absolute: 0.1 10*3/uL (ref 0.0–0.1)
Basophils Relative: 1 %
Eosinophils Absolute: 0.2 10*3/uL (ref 0.0–0.5)
Eosinophils Relative: 2 %
HCT: 25.6 % — ABNORMAL LOW (ref 39.0–52.0)
Hemoglobin: 6.1 g/dL — CL (ref 13.0–17.0)
Immature Granulocytes: 0 %
Lymphocytes Relative: 23 %
Lymphs Abs: 1.7 10*3/uL (ref 0.7–4.0)
MCH: 15.2 pg — ABNORMAL LOW (ref 26.0–34.0)
MCHC: 23.8 g/dL — ABNORMAL LOW (ref 30.0–36.0)
MCV: 63.8 fL — ABNORMAL LOW (ref 80.0–100.0)
Monocytes Absolute: 0.7 10*3/uL (ref 0.1–1.0)
Monocytes Relative: 9 %
Neutro Abs: 4.9 10*3/uL (ref 1.7–7.7)
Neutrophils Relative %: 65 %
Platelets: 572 10*3/uL — ABNORMAL HIGH (ref 150–400)
RBC: 4.01 MIL/uL — ABNORMAL LOW (ref 4.22–5.81)
RDW: 23.9 % — ABNORMAL HIGH (ref 11.5–15.5)
Smear Review: NORMAL
WBC: 7.5 10*3/uL (ref 4.0–10.5)
nRBC: 0 % (ref 0.0–0.2)

## 2020-10-25 LAB — COMPREHENSIVE METABOLIC PANEL
ALT: 9 U/L (ref 0–44)
AST: 13 U/L — ABNORMAL LOW (ref 15–41)
Albumin: 2.8 g/dL — ABNORMAL LOW (ref 3.5–5.0)
Alkaline Phosphatase: 115 U/L (ref 38–126)
Anion gap: 7 (ref 5–15)
BUN: 14 mg/dL (ref 8–23)
CO2: 30 mmol/L (ref 22–32)
Calcium: 8.5 mg/dL — ABNORMAL LOW (ref 8.9–10.3)
Chloride: 99 mmol/L (ref 98–111)
Creatinine, Ser: 0.84 mg/dL (ref 0.61–1.24)
GFR, Estimated: >60 mL/min (ref >60)
Glucose, Bld: 134 mg/dL — ABNORMAL HIGH (ref 70–99)
Potassium: 5.2 mmol/L — ABNORMAL HIGH (ref 3.5–5.1)
Sodium: 136 mmol/L (ref 135–145)
Total Bilirubin: 0.7 mg/dL (ref 0.3–1.2)
Total Protein: 7.5 g/dL (ref 6.5–8.1)

## 2020-10-25 LAB — RETICULOCYTES
Immature Retic Fract: 29.8 % — ABNORMAL HIGH (ref 2.3–15.9)
RBC.: 4.03 MIL/uL — ABNORMAL LOW (ref 4.22–5.81)
Retic Count, Absolute: 89.5 10*3/uL (ref 19.0–186.0)
Retic Ct Pct: 2.2 % (ref 0.4–3.1)

## 2020-10-25 LAB — PROTIME-INR
INR: 1.2 (ref 0.8–1.2)
Prothrombin Time: 15.2 seconds (ref 11.4–15.2)

## 2020-10-25 LAB — IRON AND TIBC
Iron: 9 ug/dL — ABNORMAL LOW (ref 45–182)
Saturation Ratios: 2 % — ABNORMAL LOW (ref 17.9–39.5)
TIBC: 410 ug/dL (ref 250–450)
UIBC: 401 ug/dL

## 2020-10-25 LAB — HEMOGLOBIN A1C
Hgb A1c MFr Bld: 6.3 % — ABNORMAL HIGH (ref 4.8–5.6)
Mean Plasma Glucose: 134.11 mg/dL

## 2020-10-25 LAB — PREPARE RBC (CROSSMATCH)

## 2020-10-25 LAB — LACTIC ACID, PLASMA
Lactic Acid, Venous: 1.4 mmol/L (ref 0.5–1.9)
Lactic Acid, Venous: 1.3 mmol/L (ref 0.5–1.9)

## 2020-10-25 LAB — RESP PANEL BY RT-PCR (FLU A&B, COVID) ARPGX2
Influenza A by PCR: NEGATIVE
Influenza B by PCR: NEGATIVE
SARS Coronavirus 2 by RT PCR: NEGATIVE

## 2020-10-25 LAB — ABO/RH: ABO/RH(D): O POS

## 2020-10-25 LAB — FOLATE: Folate: 8.9 ng/mL (ref >5.9)

## 2020-10-25 LAB — FERRITIN: Ferritin: 5 ng/mL — ABNORMAL LOW (ref 24–336)

## 2020-10-25 LAB — HEMOGLOBIN AND HEMATOCRIT, BLOOD
HCT: 26.1 % — ABNORMAL LOW (ref 39.0–52.0)
Hemoglobin: 6.1 g/dL — CL (ref 13.0–17.0)

## 2020-10-25 LAB — CBG MONITORING, ED: Glucose-Capillary: 146 mg/dL — ABNORMAL HIGH (ref 70–99)

## 2020-10-25 LAB — C-REACTIVE PROTEIN: CRP: 2.4 mg/dL — ABNORMAL HIGH (ref <1.0)

## 2020-10-25 LAB — POC OCCULT BLOOD, ED: Fecal Occult Bld: POSITIVE — AB

## 2020-10-25 MED ORDER — PANTOPRAZOLE 80MG IVPB - SIMPLE MED
80.0000 mg | Freq: Once | INTRAVENOUS | Status: AC
  Administered 2020-10-25: 80 mg via INTRAVENOUS
  Filled 2020-10-25: qty 100

## 2020-10-25 MED ORDER — VANCOMYCIN HCL 1500 MG/300ML IV SOLN
1500.0000 mg | Freq: Two times a day (BID) | INTRAVENOUS | Status: DC
  Administered 2020-10-26 – 2020-10-27 (×3): 1500 mg via INTRAVENOUS
  Filled 2020-10-25 (×5): qty 300

## 2020-10-25 MED ORDER — ONDANSETRON HCL 4 MG/2ML IJ SOLN: 4.0000 mg | Freq: Four times a day (QID) | INTRAMUSCULAR | Status: DC | PRN

## 2020-10-25 MED ORDER — INSULIN ASPART 100 UNIT/ML IJ SOLN
15.0000 [IU] | Freq: Every day | INTRAMUSCULAR | Status: DC
  Administered 2020-10-26: 15 [IU] via SUBCUTANEOUS
  Filled 2020-10-25: qty 1

## 2020-10-25 MED ORDER — PANTOPRAZOLE INFUSION (NEW) - SIMPLE MED
8.0000 mg/h | INTRAVENOUS | Status: AC
  Administered 2020-10-25 – 2020-10-28 (×8): 8 mg/h via INTRAVENOUS
  Filled 2020-10-25: qty 80
  Filled 2020-10-25 (×4): qty 100
  Filled 2020-10-25 (×3): qty 80
  Filled 2020-10-25: qty 100

## 2020-10-25 MED ORDER — INSULIN ASPART 100 UNIT/ML IJ SOLN
30.0000 [IU] | Freq: Two times a day (BID) | INTRAMUSCULAR | Status: DC
  Administered 2020-10-26 (×2): 30 [IU] via SUBCUTANEOUS
  Filled 2020-10-25: qty 1

## 2020-10-25 MED ORDER — ONDANSETRON HCL 4 MG PO TABS: 4.0000 mg | ORAL_TABLET | Freq: Four times a day (QID) | ORAL | Status: DC | PRN

## 2020-10-25 MED ORDER — NICOTINE 21 MG/24HR TD PT24
21.0000 mg | MEDICATED_PATCH | Freq: Every day | TRANSDERMAL | Status: DC
  Administered 2020-10-25 – 2020-11-04 (×10): 21 mg via TRANSDERMAL
  Filled 2020-10-25 (×10): qty 1

## 2020-10-25 MED ORDER — INSULIN ASPART 100 UNIT/ML IJ SOLN: 0.0000 [IU] | Freq: Three times a day (TID) | INTRAMUSCULAR | Status: DC

## 2020-10-25 MED ORDER — MELATONIN 5 MG PO TABS
5.0000 mg | ORAL_TABLET | Freq: Every evening | ORAL | Status: DC | PRN
  Filled 2020-10-25: qty 1

## 2020-10-25 MED ORDER — PRAVASTATIN SODIUM 10 MG PO TABS
20.0000 mg | ORAL_TABLET | Freq: Every day | ORAL | Status: DC
  Administered 2020-10-26 – 2020-11-03 (×9): 20 mg via ORAL
  Filled 2020-10-25 (×9): qty 2

## 2020-10-25 MED ORDER — VANCOMYCIN HCL 2000 MG/400ML IV SOLN
2000.0000 mg | Freq: Once | INTRAVENOUS | Status: AC
  Administered 2020-10-25: 2000 mg via INTRAVENOUS
  Filled 2020-10-25: qty 400

## 2020-10-25 MED ORDER — INSULIN GLARGINE-YFGN 100 UNIT/ML ~~LOC~~ SOLN
55.0000 [IU] | Freq: Every day | SUBCUTANEOUS | Status: DC
  Administered 2020-10-26: 55 [IU] via SUBCUTANEOUS
  Filled 2020-10-25 (×2): qty 0.55

## 2020-10-25 MED ORDER — ACETAMINOPHEN 650 MG RE SUPP: 650.0000 mg | Freq: Four times a day (QID) | RECTAL | Status: DC | PRN

## 2020-10-25 MED ORDER — GABAPENTIN 300 MG PO CAPS
300.0000 mg | ORAL_CAPSULE | Freq: Three times a day (TID) | ORAL | Status: DC
  Administered 2020-10-25 – 2020-11-04 (×27): 300 mg via ORAL
  Filled 2020-10-25 (×3): qty 1
  Filled 2020-10-25: qty 3
  Filled 2020-10-25 (×23): qty 1

## 2020-10-25 MED ORDER — EMPAGLIFLOZIN 25 MG PO TABS
25.0000 mg | ORAL_TABLET | Freq: Every day | ORAL | Status: DC
  Administered 2020-10-26 – 2020-11-04 (×9): 25 mg via ORAL
  Filled 2020-10-25 (×11): qty 1

## 2020-10-25 MED ORDER — SODIUM CHLORIDE 0.9 % IV SOLN
2.0000 g | INTRAVENOUS | Status: DC
  Administered 2020-10-25 – 2020-10-29 (×5): 2 g via INTRAVENOUS
  Filled 2020-10-25 (×7): qty 20

## 2020-10-25 MED ORDER — INSULIN ASPART 100 UNIT/ML IJ SOLN
0.0000 [IU] | Freq: Three times a day (TID) | INTRAMUSCULAR | Status: DC
  Administered 2020-10-27: 1 [IU] via SUBCUTANEOUS
  Administered 2020-10-27 – 2020-10-29 (×3): 2 [IU] via SUBCUTANEOUS
  Administered 2020-10-29 (×2): 1 [IU] via SUBCUTANEOUS
  Administered 2020-10-30 – 2020-10-31 (×2): 2 [IU] via SUBCUTANEOUS
  Administered 2020-10-31: 1 [IU] via SUBCUTANEOUS
  Administered 2020-11-01: 2 [IU] via SUBCUTANEOUS
  Administered 2020-11-02 – 2020-11-03 (×4): 1 [IU] via SUBCUTANEOUS
  Administered 2020-11-04: 2 [IU] via SUBCUTANEOUS

## 2020-10-25 MED ORDER — SODIUM CHLORIDE 0.9 % IV SOLN
10.0000 mL/h | Freq: Once | INTRAVENOUS | Status: AC
  Administered 2020-10-25: 10 mL/h via INTRAVENOUS

## 2020-10-25 MED ORDER — INSULIN ASPART 100 UNIT/ML FLEXPEN: 15.0000 [IU] | PEN_INJECTOR | SUBCUTANEOUS | Status: DC

## 2020-10-25 MED ORDER — CLINDAMYCIN PHOSPHATE 600 MG/50ML IV SOLN
600.0000 mg | Freq: Once | INTRAVENOUS | Status: AC
  Administered 2020-10-25: 600 mg via INTRAVENOUS
  Filled 2020-10-25: qty 50

## 2020-10-25 MED ORDER — METRONIDAZOLE 500 MG PO TABS
500.0000 mg | ORAL_TABLET | Freq: Two times a day (BID) | ORAL | Status: DC
  Administered 2020-10-25 – 2020-10-30 (×9): 500 mg via ORAL
  Filled 2020-10-25 (×9): qty 1

## 2020-10-25 MED ORDER — INSULIN ASPART 100 UNIT/ML IJ SOLN: 0.0000 [IU] | Freq: Every day | INTRAMUSCULAR | Status: DC

## 2020-10-25 MED ORDER — CIPROFLOXACIN IN D5W 400 MG/200ML IV SOLN
400.0000 mg | Freq: Once | INTRAVENOUS | Status: AC
  Administered 2020-10-25: 400 mg via INTRAVENOUS
  Filled 2020-10-25: qty 200

## 2020-10-25 MED ORDER — ACETAMINOPHEN 325 MG PO TABS: 650.0000 mg | ORAL_TABLET | Freq: Four times a day (QID) | ORAL | Status: DC | PRN

## 2020-10-25 NOTE — Subjective & Objective (Addendum)
CC: right foot drainage HPI: 70 yo WM with hx of type 2 DM, on insulin, HTN, tobacco abuse, history of peripheral vascular disease, obesity who presents to the ER today due to 3 to 4-day history of right foot drainage, right foot swelling.  Patient has a history of diabetic foot ulcers.  Patient states that over the last 3 to 4 days he has noticed increasing swelling, drainage of his foot.  Denies any fever or chills.  He has noticed an odor coming from his foot.  Patient has been followed by outpatient rehab for about a month due to an open sore on his foot.  He has been on Bactrim antibiotics.  Patient denies any chills, chest pain.  He states that he has peripheral neuropathy does not feel a lot in his foot.  Patient states that intermittently he has noticed dark stools for 2 to 3 days then will turn back to brown.  He states that this has been happening for the last 2 to 3 weeks.  He has never seen any gross hematochezia.  Patient only takes aspirin at home.  He states he is only had 1 colonoscopy in his life.  He states that after his first 1 he did not want another one.  Patient states that over 25 years ago he was seen at [facility] by a "old doctor "for vascular disease in his legs.  He states that needles were placed throughout the legs to open up the veins in his legs.  No records are available through care everywhere.  On admission to the ER, temperature 97.7 heart rate 83 blood pressure 159/68 satting 96% on room air.  Lab evaluation White count 7.5, hemoglobin 6.1, platelets 572.  Repeat hemoglobin was 6.1 g/dL.  Hemoccult was positive.  Chemistry sodium 136 potassium 5.2 chloride of 99 BUN of 14 creatinine 0.8 glucose 134.  Lactic acid was normal at 1.4.  Iron studies showed iron of 9 TIBC of 410% saturation of 2%  COVID test is negative  Right foot x-ray shows lytic destruction of the distal fourth metatarsal concerning for osteomyelitis.  Gas is seen in the adjacent soft  tissues suggesting cellulitis.  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  After further discussion with the patient, he understands the need to transfer to [facility] for further care.

## 2020-10-25 NOTE — Therapy (Signed)
[facility] [address] [place], [place], [address] Phone: [phone]   Fax:  [phone]  Wound Care Therapy  Patient Details  Name: Mitchell [name] MRN: [id number] Date of Birth: 06-02-1950 Referring Provider (PT): Kirstie Peri MD   Encounter Date: 10/25/2020   PT End of Session - 10/25/20 1132     Visit Number 11    Number of Visits 30    Date for PT Re-Evaluation 12/01/20    Authorization Type [employer] Medicare  (no vl, no auth)    Progress Note Due on Visit 20    PT Start Time 1134    PT Stop Time 1210    PT Time Calculation (min) 36 min    Activity Tolerance Patient tolerated treatment well    Behavior During Therapy [facility] for tasks assessed/performed             Past Medical History:  Diagnosis Date   Arthritis    Cellulitis of right lower extremity 05/09/2014   Diabetes mellitus without complication (HCC)    Diabetic foot ulcer (HCC) 07/07/2014   Status post bedside debridement of multiple right plantar ulcers by podiatrist, Dr. Reynolds Bowl.   Hypercholesterolemia    Maggot infestation    right foot ulcer   Obesity 05/09/2014   Tobacco abuse 07/07/2014    Past Surgical History:  Procedure Laterality Date   APPENDECTOMY     PERIPHERAL VASCULAR CATHETERIZATION N/A 07/22/2015   Procedure: Abdominal Aortogram w/Lower Extremity;  Surgeon: Sherren Kerns, MD;  Location: [facility] INVASIVE CV LAB;  Service: Cardiovascular;  Laterality: N/A;    There were no vitals filed for this visit.               Wound Therapy - 10/25/20 0001     Subjective Patient states on same antibiotics, smelling worse and some soreness    Patient and Family Stated Goals wounds to heal    Date of Onset 09/13/20    Prior Treatments self care    Pain Score 0-No pain    Evaluation and Treatment Procedures Explained to Patient/Family Yes    Evaluation and Treatment Procedures agreed to    Wound Properties Date First Assessed: 09/20/20 Time First  Assessed: 1300 Wound Type: Diabetic ulcer Location: Heel Location Orientation: Right Wound Description (Comments): R lateral plantar wound Present on Admission: Yes   Wound Image Images linked: 1    Dressing Type Silver hydrofiber    Dressing Changed Changed    Dressing Status Old drainage    Dressing Change Frequency Monday, Wednesday, Friday    Site / Wound Assessment Red;Yellow    Drainage Amount Copious    Drainage Description Purulent;Odor    Treatment Cleansed    Wound Therapy - Clinical Statement Patient with copious amounts of brown purulent drainage leaking from compression wrap with strong odor. Patient's wound continues to drain heavily with increased edema in foot. Patient's wound has increased in size and depth in last few weeks with visible bone currently. Patient wound appearing worse and patient educated on going to ED for further evaluation and treatment of foot wound. Cleansed foot and wrapped with kerlix and bootie with darco shoe and sent patient to ED.    Wound Therapy - Functional Problem List dressing, bathing, walking    Factors Delaying/Impairing Wound Healing Altered sensation;Diabetes Mellitus;Multiple medical problems;Tobacco use;Vascular compromise    Hydrotherapy Plan Debridement;Dressing change;Electrical stimulation;Patient/family education;Pulsatile lavage with suction    Wound Therapy - Frequency 3X / week  6 weeks   Wound Therapy - Current Recommendations PT    Wound Plan continue with pulsed lavage to thoroughly irrigate wound depth, appropriate dressing conducive to environment.  Follow up with MD communication regarding antibiotics, surgical consult and xrays.  Instruct patient to change his appointments to Mon/Wed/Fri due to increased frequency need/drainage.    Dressing  abd, kerlix, blue bootie, darco shoe                       PT Short Term Goals - 09/20/20 1356       PT SHORT TERM GOAL #1   Title Patient will be able to verbalize daily  skin checks.    Time 3    Period Weeks    Status New    Target Date 10/11/20      PT SHORT TERM GOAL #2   Title Wounds to be 100% granulated to allow healing    Time 3    Period Weeks    Status New    Target Date 10/11/20      PT SHORT TERM GOAL #3   Title LE to no longer be indurated to allow improved healing    Time 3    Period Weeks    Status New    Target Date 10/11/20               PT Long Term Goals - 09/20/20 1357       PT LONG TERM GOAL #1   Title Paitent's wound will be healed.    Time 6    Period Weeks    Status New    Target Date 11/01/20      PT LONG TERM GOAL #2   Title Patient will wear compression garments daily and continue skin checks.    Time 6    Period Weeks    Status New    Target Date 11/01/20                   Plan - 10/25/20 1132     Clinical Impression Statement see above    Personal Factors and Comorbidities Comorbidity 3+    Comorbidities smoker. DM, vascular compromise    Examination-Activity Limitations Bathing;Dressing;Locomotion Level    Examination-Participation Restrictions Yard Work;Shop    Stability/Clinical Decision Making Stable/Uncomplicated    Rehab Potential Good    PT Frequency 2x / week    PT Duration 6 weeks    PT Treatment/Interventions ADLs/Self Care Home Management;Gait training;Stair training;Functional mobility training;Therapeutic activities;Therapeutic exercise;Balance training;DME Instruction;Ultrasound;Neuromuscular re-education;Patient/family education;Compression bandaging;Manual lymph drainage;Manual techniques    PT Next Visit Plan debride and dressing changes to promote wound healing    Consulted and Agree with Plan of Care Patient             Patient will benefit from skilled therapeutic intervention in order to improve the following deficits and impairments:  Decreased skin integrity, Difficulty walking, Abnormal gait, Impaired sensation, Decreased mobility  Visit Diagnosis: Open  wound of right foot, initial encounter  Difficulty in walking, not elsewhere classified     Problem List Patient Active Problem List   Diagnosis Date Noted   Closed fracture of proximal end of right humerus with routine healing 02/25/2019   Pre-ulcerative calluses 04/20/2018   Myositis 07/08/2014   Diabetic foot ulcer associated with type 2 diabetes mellitus (HCC) 07/07/2014   Tobacco abuse 07/07/2014   Infestation, maggots 07/07/2014   Plantar ulcer of right foot (HCC) 05/12/2014  Pulse weakness    Peripheral neuropathy    Cellulitis of right lower extremity 05/09/2014   Obesity 05/09/2014   Cellulitis of right lower leg 05/09/2014   Hyperglycemia 03/21/2013   IDDM (insulin dependent diabetes mellitus) 03/21/2013   Noncompliance with medications due to cost issues 03/21/2013    12:31 PM, 10/25/20 Wyman Songster PT, DPT Physical Therapist at [facility]   [facility] [address] [place], [place], [address] Phone: [phone]   Fax:  [phone]  Name: Mitchell [name] MRN: [id number] Date of Birth: September 19, 1950

## 2020-10-25 NOTE — Assessment & Plan Note (Addendum)
-   Hemoglobin nadir 6.1 g/dL.  Responded well to blood transfusion - Currently hemoglobin remaining stable.  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

## 2020-10-25 NOTE — H&P (Signed)
History and Physical    Tremane Spurgeon YVO:[id number] DOB: Apr 05, 1950 DOA: 10/25/2020  PCP: Kirstie Peri, MD   Patient coming from: Home  I have personally briefly reviewed patient's old medical records in [facility]  CC: right foot drainage HPI: 70 yo WM with hx of type 2 DM, on insulin, HTN, tobacco abuse, history of peripheral vascular disease, obesity who presents to the ER today due to 3 to 4-day history of right foot drainage, right foot swelling.  Patient has a history of diabetic foot ulcers.  Patient states that over the last 3 to 4 days he has noticed increasing swelling, drainage of his foot.  Denies any fever or chills.  He has noticed an odor coming from his foot.  Patient has been followed by outpatient rehab for about a month due to an open sore on his foot.  He has been on Bactrim antibiotics.  Patient denies any chills, chest pain.  He states that he has peripheral neuropathy does not feel a lot in his foot.  Patient states that intermittently he has noticed dark stools for 2 to 3 days then will turn back to brown.  He states that this has been happening for the last 2 to 3 weeks.  He has never seen any gross hematochezia.  Patient only takes aspirin at home.  He states he is only had 1 colonoscopy in his life.  He states that after his first 1 he did not want another one.  Patient states that over 25 years ago he was seen at [facility] by a "old doctor "for vascular disease in his legs.  He states that needles were placed throughout the legs to open up the veins in his legs.  No records are available through care everywhere.  On admission to the ER, temperature 97.7 heart rate 83 blood pressure 159/68 satting 96% on room air.  Lab evaluation White count 7.5, hemoglobin 6.1, platelets 572.  Repeat hemoglobin was 6.1 g/dL.  Hemoccult was positive.  Chemistry sodium 136 potassium 5.2 chloride of 99 BUN of 14 creatinine 0.8 glucose 134.  Lactic acid was normal at  1.4.  Iron studies showed iron of 9 TIBC of 410% saturation of 2%  COVID test is negative  Right foot x-ray shows lytic destruction of the distal fourth metatarsal concerning for osteomyelitis.  Gas is seen in the adjacent soft tissues suggesting cellulitis.  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  After further discussion with the patient, he understands the need to transfer to [facility] for further care.   ED Course: CBC showed HgB of 6.1, repeat was 6.1. hemoccult positive. 1 unit PRBC ordered. Right foot xray suggestive of osteomyelitis.  Review of Systems:  Review of Systems  HENT: Negative.    Eyes: Negative.   Respiratory: Negative.    Cardiovascular: Negative.   Gastrointestinal:        Admits to dark colored stool intermittently for 2-3 days than back to normal brown color. Has happened for last 2-3 weeks. Denies any gross hematochezia.  Genitourinary: Negative.   Musculoskeletal: Negative.   Skin:        Increased swelling and drainage of right plantar surface of foot  Neurological: Negative.        Admit to neuropathy and decreased sensation of right foot.  Endo/Heme/Allergies: Negative.   Psychiatric/Behavioral: Negative.    All other systems reviewed and are negative.  Past Medical History:  Diagnosis Date   Arthritis    Cellulitis of right lower extremity 05/09/2014   Diabetes mellitus without complication (HCC)    Diabetic foot ulcer (HCC) 07/07/2014   Status post bedside debridement of multiple right plantar ulcers by podiatrist, Dr. Reynolds Bowl.   Hypercholesterolemia    Maggot infestation    right foot ulcer   Obesity 05/09/2014   Tobacco abuse 07/07/2014    Past Surgical History:  Procedure Laterality Date   APPENDECTOMY     PERIPHERAL VASCULAR CATHETERIZATION N/A 07/22/2015   Procedure: Abdominal Aortogram w/Lower Extremity;  Surgeon: Sherren Kerns, MD;  Location: [facility] INVASIVE CV LAB;  Service:  Cardiovascular;  Laterality: N/A;     reports that he has been smoking cigarettes. He has been smoking an average of 1.5 packs per day. He has never used smokeless tobacco. He reports current alcohol use. He reports that he does not use drugs.  Allergies  Allergen Reactions   Penicillins Itching and Rash    Has patient had a PCN reaction causing immediate rash, facial/tongue/throat swelling, SOB or lightheadedness with hypotension: Yes Has patient had a PCN reaction causing severe rash involving mucus membranes or skin necrosis: No Has patient had a PCN reaction that required hospitalization No Has patient had a PCN reaction occurring within the last 10 years: No If all of the above answers are "NO", then may proceed with Cephalosporin use.     No family history on file.  Prior to Admission medications   Medication Sig Start Date End Date Taking? Authorizing Provider  aspirin EC 81 MG tablet Take 81 mg by mouth daily.   Yes [provider]  clotrimazole-betamethasone (LOTRISONE) cream Apply topically. 07/21/20  Yes [provider]  furosemide (LASIX) 20 MG tablet Take 1 tablet (20 mg total) by mouth daily. 03/22/13  Yes Standley Brooking, MD  gabapentin (NEURONTIN) 300 MG capsule Take 1 capsule (300 mg total) by mouth 3 (three) times daily. 03/22/13  Yes Standley Brooking, MD  JARDIANCE 25 MG TABS tablet Take 25 mg by mouth daily.  05/12/18  Yes [provider]  lovastatin (MEVACOR) 20 MG tablet Take 1 tablet (20 mg total) by mouth at bedtime. Patient taking differently: Take 20 mg by mouth daily. 03/22/13  Yes Standley Brooking, MD  NOVOLOG FLEXPEN 100 UNIT/ML FlexPen Inject 15-30 Units into the skin See admin instructions. 15 units with breakfast 30 units with lunch 30 units with dinner 12/15/18  Yes [provider]  sulfamethoxazole-trimethoprim (BACTRIM DS) 800-160 MG tablet Take 1 tablet by mouth 2 (two) times daily. 10/11/20  Yes [provider]  TRESIBA FLEXTOUCH 200 UNIT/ML SOPN Inject 55 Units into the skin daily. 06/24/15  Yes [provider]  doxycycline (VIBRA-TABS) 100 MG tablet Take 1 tablet (100 mg total) by mouth 2 (two) times daily. Patient not taking: Reported on 10/25/2020 12/15/18   Candelaria Stagers, DPM  HYDROcodone-acetaminophen (NORCO/VICODIN) 5-325 MG tablet Take 1-2 tablets by mouth every 6 (six) hours as needed for moderate pain. Patient not taking: No sig reported 02/18/19   Kathryne Hitch, MD  ibuprofen (ADVIL) 800 MG tablet Take 1 tablet (800 mg total) by mouth every 6 (six) hours as needed. Patient not taking: Reported on 10/25/2020 12/15/18   Candelaria Stagers, DPM  metFORMIN (GLUCOPHAGE) 500 MG tablet Take 1,000 mg by mouth 2 (two) times daily. Patient not taking: Reported on 10/25/2020 05/26/14   [provider]    Physical Exam: Vitals:  10/25/20 1715 10/25/20 1720 10/25/20 1724 10/25/20 1730  BP: (!) 148/75 (!) 144/60 (!) 144/60 128/66  Pulse: 74 75 71 72  Resp: 14 (!) 22 (!) 25 20  Temp:   98.4 F (36.9 C)   TempSrc:   Oral   SpO2: 98% 98% 100% 100%  Weight:      Height:        Physical Exam Vitals and nursing note reviewed.  Constitutional:      General: He is not in acute distress.    Appearance: He is obese. He is not ill-appearing, toxic-appearing or diaphoretic.  HENT:     Head: Normocephalic and atraumatic.     Nose: Nose normal. No rhinorrhea.  Eyes:     General:        Right eye: No discharge.        Left eye: No discharge.  Cardiovascular:     Rate and Rhythm: Normal rate and regular rhythm.     Pulses: Decreased pulses.     Comments: No palpable pulses in right or left foot. Both feet are warm Pulmonary:     Effort: Pulmonary effort is normal. No respiratory distress.     Breath sounds: No wheezing or rales.  Abdominal:     General: Abdomen is protuberant. Bowel sounds are normal. There is no distension.     Tenderness: There is no abdominal  tenderness. There is no guarding or rebound.  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     Comments: Right foot grossly edematous. 2 cm defect on plantar aspect of right foot near 4th/5th metatarsal head. See pictures.  Neurological:     Mental Status: He is alert and oriented to person, place, and time.        Labs on Admission: I have personally reviewed following labs and imaging studies  CBC: Recent Labs  Lab 10/25/20 1445 10/25/20 1550  WBC 7.5  --   NEUTROABS 4.9  --   HGB 6.1* 6.1*  HCT 25.6* 26.1*  MCV 63.8*  --   PLT 572*  --    Basic Metabolic Panel: Recent Labs  Lab 10/25/20 1445  NA 136  K 5.2*  CL 99  CO2 30  GLUCOSE 134*  BUN 14  CREATININE 0.84  CALCIUM 8.5*   GFR: Estimated Creatinine Clearance: 104.5 mL/min (by C-G formula based on SCr of 0.84 mg/dL). Liver Function Tests: Recent Labs  Lab 10/25/20 1445  AST 13*  ALT 9  ALKPHOS 115  BILITOT 0.7  PROT 7.5  ALBUMIN 2.8*   No results for input(s): LIPASE, AMYLASE in the last 168 hours. No results for input(s): AMMONIA in the last 168 hours. Coagulation Profile: Recent Labs  Lab 10/25/20 1550  INR 1.2   Cardiac Enzymes: No results for input(s): CKTOTAL, CKMB, CKMBINDEX, TROPONINI in the last 168 hours. BNP (last 3 results) No results for input(s): PROBNP in the last 8760 hours. HbA1C: No results for input(s): HGBA1C in the last 72 hours. CBG: No results for input(s): GLUCAP in the last 168 hours. Lipid Profile: No results for input(s): CHOL, HDL, LDLCALC, TRIG, CHOLHDL, LDLDIRECT in the last 72 hours. Thyroid Function Tests: No results for input(s): TSH, T4TOTAL, FREET4, T3FREE, THYROIDAB in the last 72 hours. Anemia Panel: Recent Labs    10/25/20 1445  VITAMINB12 265  FOLATE 8.9  FERRITIN 5*  TIBC 410  IRON 9*  RETICCTPCT 2.2  Urine analysis:    Component Value Date/Time   COLORURINE YELLOW 05/09/2014 1650    APPEARANCEUR CLEAR 05/09/2014 1650   LABSPEC 1.020 05/09/2014 1650   PHURINE 5.0 05/09/2014 1650   GLUCOSEU >1000 (A) 05/09/2014 1650   HGBUR NEGATIVE 05/09/2014 1650   BILIRUBINUR NEGATIVE 05/09/2014 1650   KETONESUR NEGATIVE 05/09/2014 1650   PROTEINUR NEGATIVE 05/09/2014 1650   UROBILINOGEN 0.2 05/09/2014 1650   NITRITE NEGATIVE 05/09/2014 1650   LEUKOCYTESUR NEGATIVE 05/09/2014 1650    Radiological Exams on Admission: I have personally reviewed images DG Foot Complete Right  Result Date: 10/25/2020 CLINICAL DATA:  Right foot infection. EXAM: RIGHT FOOT COMPLETE - 3+ VIEW COMPARISON:  January 14, 2019. FINDINGS: Status post amputation of third toe. Defect is seen involving distal portions of second, third and fifth metatarsals consistent with old osteomyelitis. There is possible lytic destruction involving the distal fourth metatarsal concerning for osteomyelitis. Gas is seen in the adjacent soft tissues suggesting cellulitis. Severe swelling of the second and fourth toes is noted IMPRESSION: Chronic changes as described above. Possible acute lytic destruction involving distal fourth metatarsal concerning for osteomyelitis. Gas is seen in the adjacent soft tissues suggesting cellulitis. Electronically Signed   By: Lupita Raider M.D.   On: 10/25/2020 14:30    EKG: I have personally reviewed EKG: NSR   Assessment/Plan Principal Problem:   Foot osteomyelitis, right (HCC) Active Problems:   Blood loss anemia   Diabetic ulcer of right foot (HCC)   GI bleed   Type 2 diabetes mellitus with foot ulcer (HCC)   Obesity   Peripheral neuropathy   Tobacco abuse    Foot osteomyelitis, right (HCC) Admit to med/surg bed at [facility]. IV rocephin, flagyl and IV Vanco. Check MRI right foot with and without contrast. Check ABI with toe pressures.  May need podiatry vs orthopedic consult. May need vascular surgery consult. 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 Follow inflammatory markers.  Blood loss anemia Pt being transfused 1 unit PRBC while at [facility]. May need to see GI consult while at [facility]. Pt has seen intermittent dark stools over the last 2 weeks.  Diabetic ulcer of right foot (HCC) Continue with IV ABx. See plan for osteomyelitis of foot.  GI bleed +hemoccult in the ER. Pt takes only ASA. On po protonix. May need GI consult.  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 Continue with insulin. Hold metformin.  Obesity Chronic.  Peripheral neuropathy Chronic. Continue with neurontin.  Tobacco abuse Nicotine patch offered.  DVT prophylaxis: Lovenox Code Status: Full Code Family Communication: no family at bedside  Disposition Plan: return home  Consults called: none  Admission status: Inpatient, Med-Surg at [facility], DO [facility] 10/25/2020, 5:32 PM

## 2020-10-25 NOTE — Assessment & Plan Note (Addendum)
-   A1c 6.3% on 10/25/2020 - Continue CBG monitoring and SSI - Resume metformin at discharge

## 2020-10-25 NOTE — Progress Notes (Signed)
Pharmacy Antibiotic Note  Mitchell [name] is a 70 y.o. male admitted on 10/25/2020 with cellulitis.  Pharmacy has been consulted for Vancomycin dosing. Patient presented with foot pain. He has worsening drainage from foot. Hx of diabetic foot ulcer.  Plan: Vancomycin 2000mg  IV loading dose, then 1500mg I V q12h for AUC 450 (goal AUC 400-550) F/U cxs and clinical progress Monitor V/S, labs and levels as indicated  Height: 5\' 11"  (180.3 cm) Weight: 112.9 kg (248 lb 14.4 oz) IBW/kg (Calculated) : 75.3  Temp (24hrs), Avg:97.7 F (36.5 C), Min:97.7 F (36.5 C), Max:97.7 F (36.5 C)  Recent Labs  Lab 10/25/20 1445  WBC 7.5  CREATININE 0.84  LATICACIDVEN 1.4    Estimated Creatinine Clearance: 104.5 mL/min (by C-G formula based on SCr of 0.84 mg/dL).    Allergies  Allergen Reactions   Penicillins Itching and Rash    Has patient had a PCN reaction causing immediate rash, facial/tongue/throat swelling, SOB or lightheadedness with hypotension: Yes Has patient had a PCN reaction causing severe rash involving mucus membranes or skin necrosis: No Has patient had a PCN reaction that required hospitalization No Has patient had a PCN reaction occurring within the last 10 years: No If all of the above answers are "NO", then may proceed with Cephalosporin use.     Antimicrobials this admission: Vancomycin  10/11 >>  Clindamycin 600mg  IV x 1 in ED Cipro 400mg  IV x 1 in ED  Microbiology results: 10/11 BCx: pending  MRSA PCR:   Thank you for allowing pharmacy to be a part of this patient's care.  , BS 12/25/20, Clinical Pharmacist Pager [phone]  10/25/2020 4:03 PM

## 2020-10-25 NOTE — ED Provider Notes (Addendum)
[facility] EMERGENCY DEPARTMENT Provider Note   CSN: [id number] Arrival date & time: 10/25/20  1220     History Chief Complaint  Patient presents with   Foot Pain    Mitchell [name] is a 70 y.o. male.  Pt presents to the ED today with worsening drainage from his right foot.  Pt has a hx of a diabetic foot ulcer and has been going to wound care.  Pt did have a round of bactrim.  Today, he went to the wound care center and they noted "copious amounts of brown purulent drainage leaking from compression wrap with strong odor. Patient's wound continues to drain heavily with increased edema in foot. Patient's wound has increased in size and depth in last few weeks with visible bone currently."  Due to this, they sent him to the ED for further eval.  Pt denies any significant pain.  No f/c.      Past Medical History:  Diagnosis Date   Arthritis    Cellulitis of right lower extremity 05/09/2014   Diabetes mellitus without complication (HCC)    Diabetic foot ulcer (HCC) 07/07/2014   Status post bedside debridement of multiple right plantar ulcers by podiatrist, Dr. Reynolds Bowl.   Hypercholesterolemia    Maggot infestation    right foot ulcer   Obesity 05/09/2014   Tobacco abuse 07/07/2014    Patient Active Problem List   Diagnosis Date Noted   Closed fracture of proximal end of right humerus with routine healing 02/25/2019   Pre-ulcerative calluses 04/20/2018   Myositis 07/08/2014   Diabetic foot ulcer associated with type 2 diabetes mellitus (HCC) 07/07/2014   Tobacco abuse 07/07/2014   Infestation, maggots 07/07/2014   Plantar ulcer of right foot (HCC) 05/12/2014   Pulse weakness    Peripheral neuropathy    Cellulitis of right lower extremity 05/09/2014   Obesity 05/09/2014   Cellulitis of right lower leg 05/09/2014   Hyperglycemia 03/21/2013   IDDM (insulin dependent diabetes mellitus) 03/21/2013   Noncompliance with medications due to cost issues 03/21/2013    Past Surgical History:   Procedure Laterality Date   APPENDECTOMY     PERIPHERAL VASCULAR CATHETERIZATION N/A 07/22/2015   Procedure: Abdominal Aortogram w/Lower Extremity;  Surgeon: Sherren Kerns, MD;  Location: [facility] INVASIVE CV LAB;  Service: Cardiovascular;  Laterality: N/A;       No family history on file.  Social History   Tobacco Use   Smoking status: Every Day    Packs/day: 1.50    Types: Cigarettes   Smokeless tobacco: Never  Substance Use Topics   Alcohol use: Yes    Alcohol/week: 0.0 standard drinks    Comment: occ-twice a week beer approx 2   Drug use: No    Home Medications Prior to Admission medications   Medication Sig Start Date End Date Taking? Authorizing Provider  aspirin EC 81 MG tablet Take 81 mg by mouth daily.    [provider]  doxycycline (VIBRA-TABS) 100 MG tablet Take 1 tablet (100 mg total) by mouth 2 (two) times daily. 12/15/18   Candelaria Stagers, DPM  furosemide (LASIX) 20 MG tablet Take 1 tablet (20 mg total) by mouth daily. 03/22/13   Standley Brooking, MD  gabapentin (NEURONTIN) 300 MG capsule Take 1 capsule (300 mg total) by mouth 3 (three) times daily. 03/22/13   Standley Brooking, MD  HYDROcodone-acetaminophen (NORCO/VICODIN) 5-325 MG tablet Take 1-2 tablets by mouth every 6 (six) hours as needed for moderate pain. 02/18/19  Kathryne Hitch, MD  ibuprofen (ADVIL) 800 MG tablet Take 1 tablet (800 mg total) by mouth every 6 (six) hours as needed. 12/15/18   Candelaria Stagers, DPM  JARDIANCE 25 MG TABS tablet Take 25 mg by mouth daily.  05/12/18   [provider]  lovastatin (MEVACOR) 20 MG tablet Take 1 tablet (20 mg total) by mouth at bedtime. 03/22/13   Standley Brooking, MD  metFORMIN (GLUCOPHAGE) 500 MG tablet Take 1,000 mg by mouth 2 (two) times daily. 05/26/14   [provider]  NOVOLOG FLEXPEN 100 UNIT/ML FlexPen Inject 15-30 Units into the skin See admin instructions. 15 units with breakfast 30 units with lunch 30 units with dinner  12/15/18   [provider]  TRESIBA FLEXTOUCH 200 UNIT/ML SOPN Inject 55 Units into the skin daily. 06/24/15   [provider]    Allergies    Penicillins  Review of Systems   Review of Systems  Skin:  Positive for wound.  All other systems reviewed and are negative.  Physical Exam Updated Vital Signs BP (!) 159/68 (BP Location: Right Arm)   Pulse 74   Temp 97.7 F (36.5 C) (Oral)   Resp (!) 22   Ht 5\' 11"  (1.803 m)   Wt 112.9 kg   SpO2 96%   BMI 34.71 kg/m   Physical Exam Vitals and nursing note reviewed.  Constitutional:      Appearance: Normal appearance.  HENT:     Head: Normocephalic and atraumatic.     Right Ear: External ear normal.     Left Ear: External ear normal.     Nose: Nose normal.     Mouth/Throat:     Mouth: Mucous membranes are moist.     Pharynx: Oropharynx is clear.  Eyes:     Extraocular Movements: Extraocular movements intact.     Conjunctiva/sclera: Conjunctivae normal.     Pupils: Pupils are equal, round, and reactive to light.  Cardiovascular:     Rate and Rhythm: Normal rate and regular rhythm.     Pulses: Normal pulses.     Heart sounds: Normal heart sounds.  Pulmonary:     Effort: Pulmonary effort is normal.     Breath sounds: Normal breath sounds.  Abdominal:     General: Abdomen is flat. Bowel sounds are normal.     Palpations: Abdomen is soft.  Genitourinary:    Rectum: Guaiac result positive.     Comments: (not black) stool Musculoskeletal:        General: Normal range of motion.     Cervical back: Normal range of motion and neck supple.  Skin:    Capillary Refill: Capillary refill takes less than 2 seconds.     Comments: See picture  Neurological:     General: No focal deficit present.     Mental Status: He is alert and oriented to person, place, and time.  Psychiatric:        Mood and Affect: Mood normal.        Behavior: Behavior normal.   Media Information   ED Results / Procedures /  Treatments   Labs (all labs ordered are listed, but only abnormal results are displayed) Labs Reviewed  CBC WITH DIFFERENTIAL/PLATELET - Abnormal; Notable for the following components:      Result Value   RBC 4.01 (*)    Hemoglobin 6.1 (*)    HCT 25.6 (*)    MCV 63.8 (*)    MCH 15.2 (*)  MCHC 23.8 (*)    RDW 23.9 (*)    Platelets 572 (*)    All other components within normal limits  CULTURE, BLOOD (ROUTINE X 2)  CULTURE, BLOOD (ROUTINE X 2)  RESP PANEL BY RT-PCR (FLU A&B, COVID) ARPGX2  LACTIC ACID, PLASMA  COMPREHENSIVE METABOLIC PANEL  LACTIC ACID, PLASMA  HEMOGLOBIN AND HEMATOCRIT, BLOOD  PROTIME-INR  POC OCCULT BLOOD, ED  TYPE AND SCREEN    EKG None  Radiology DG Foot Complete Right  Result Date: 10/25/2020 CLINICAL DATA:  Right foot infection. EXAM: RIGHT FOOT COMPLETE - 3+ VIEW COMPARISON:  January 14, 2019. FINDINGS: Status post amputation of third toe. Defect is seen involving distal portions of second, third and fifth metatarsals consistent with old osteomyelitis. There is possible lytic destruction involving the distal fourth metatarsal concerning for osteomyelitis. Gas is seen in the adjacent soft tissues suggesting cellulitis. Severe swelling of the second and fourth toes is noted IMPRESSION: Chronic changes as described above. Possible acute lytic destruction involving distal fourth metatarsal concerning for osteomyelitis. Gas is seen in the adjacent soft tissues suggesting cellulitis. Electronically Signed   By: Lupita Raider M.D.   On: 10/25/2020 14:30    Procedures Procedures   Medications Ordered in ED Medications  ciprofloxacin (CIPRO) IVPB 400 mg (400 mg Intravenous New Bag/Given 10/25/20 1529)  clindamycin (CLEOCIN) IVPB 600 mg (has no administration in time range)  vancomycin (VANCOREADY) IVPB 2000 mg/400 mL (2,000 mg Intravenous New Bag/Given 10/25/20 1530)  pantoprazole (PROTONIX) 80 mg /NS 100 mL IVPB (has no administration in time range)   pantoprozole (PROTONIX) 80 mg /NS 100 mL infusion (has no administration in time range)    ED Course  I have reviewed the triage vital signs and the nursing notes.  Pertinent labs & imaging results that were available during my care of the patient were reviewed by me and considered in my medical decision making (see chart for details).    MDM Rules/Calculators/A&P                         Hgb came back at 6.  Pt denies any bleeding.  No black stool.  Stool is guaiac + and brown.  Protonix started.  Repeat h/h ordered to verify.  Type and Screen added on.  Pt ok with receiving blood.  Remaining are pending at shift change.  Pt will need admission for his osteo.  Pt signed out to Dr. Estell Harpin.  Final Clinical Impression(s) / ED Diagnoses Final diagnoses:  Infection  Other acute osteomyelitis of right foot (HCC)  Upper GI bleed    Rx / DC Orders ED Discharge Orders     None        Jacalyn Lefevre, MD 10/25/20 1610    Jacalyn Lefevre, MD 10/25/20 1535

## 2020-10-25 NOTE — ED Notes (Signed)
Date and time results received: 10/25/20 1538 (use smartphrase ".now" to insert current time)  Test: Hgb  Critical Value: 6.1  Name of Provider Notified: Dr. Particia Nearing  Orders Received? Or Actions Taken?: see chart

## 2020-10-25 NOTE — Assessment & Plan Note (Addendum)
-   Diagnosed on MRI foot on admission - Underwent transmetatarsal amputation with vascular surgery on 10/27/2020 - Wound VAC in place with changes on Monday, Wednesday, Friday. -Has been on vancomycin, Rocephin, Flagyl since admission.  Still some calor noted in right lower leg on exam on 10/30/2020 but tenderness has improved.  Cultures reviewed.  At this time will de-escalate to Unasyn to continue coverage and discharge on Augmentin to complete course (2 weeks postop per surgery); PCN allergy noted, monitor for any rash/itching after starting Unasyn  -E faecalis and group B strep noted on wound cultures from admission - will await further rec's from vascular surgery on when patient able to discharge home

## 2020-10-25 NOTE — Assessment & Plan Note (Addendum)
-  Nicotine patch 

## 2020-10-25 NOTE — Assessment & Plan Note (Addendum)
-   see GIB 

## 2020-10-25 NOTE — Assessment & Plan Note (Addendum)
Continue neurontin 

## 2020-10-25 NOTE — ED Triage Notes (Signed)
States he has been going to rehab for right foot infection for over a month. States he was advised to come here for additional treatment today

## 2020-10-25 NOTE — Assessment & Plan Note (Addendum)
-   Complicates overall prognosis and care - Body mass index is 34.71 kg/m. - Weight Loss and Dietary Counseling given

## 2020-10-25 NOTE — Assessment & Plan Note (Addendum)
See osteomyelitis 

## 2020-10-26 ENCOUNTER — Inpatient Hospital Stay (HOSPITAL_COMMUNITY): Payer: Medicare HMO

## 2020-10-26 DIAGNOSIS — M869 Osteomyelitis, unspecified: Secondary | ICD-10-CM

## 2020-10-26 DIAGNOSIS — M868X7 Other osteomyelitis, ankle and foot: Secondary | ICD-10-CM | POA: Diagnosis not present

## 2020-10-26 DIAGNOSIS — E11621 Type 2 diabetes mellitus with foot ulcer: Secondary | ICD-10-CM | POA: Diagnosis not present

## 2020-10-26 DIAGNOSIS — K922 Gastrointestinal hemorrhage, unspecified: Secondary | ICD-10-CM | POA: Diagnosis not present

## 2020-10-26 DIAGNOSIS — E1140 Type 2 diabetes mellitus with diabetic neuropathy, unspecified: Secondary | ICD-10-CM

## 2020-10-26 DIAGNOSIS — L02611 Cutaneous abscess of right foot: Secondary | ICD-10-CM

## 2020-10-26 DIAGNOSIS — F17210 Nicotine dependence, cigarettes, uncomplicated: Secondary | ICD-10-CM

## 2020-10-26 DIAGNOSIS — Z794 Long term (current) use of insulin: Secondary | ICD-10-CM

## 2020-10-26 DIAGNOSIS — Z79899 Other long term (current) drug therapy: Secondary | ICD-10-CM

## 2020-10-26 DIAGNOSIS — D500 Iron deficiency anemia secondary to blood loss (chronic): Secondary | ICD-10-CM | POA: Diagnosis not present

## 2020-10-26 DIAGNOSIS — E1169 Type 2 diabetes mellitus with other specified complication: Secondary | ICD-10-CM

## 2020-10-26 LAB — HIV ANTIBODY (ROUTINE TESTING W REFLEX): HIV Screen 4th Generation wRfx: NONREACTIVE

## 2020-10-26 LAB — HEMOGLOBIN AND HEMATOCRIT, BLOOD
HCT: 31 % — ABNORMAL LOW (ref 39.0–52.0)
HCT: 36.3 % — ABNORMAL LOW (ref 39.0–52.0)
Hemoglobin: 7.7 g/dL — ABNORMAL LOW (ref 13.0–17.0)
Hemoglobin: 9.7 g/dL — ABNORMAL LOW (ref 13.0–17.0)

## 2020-10-26 LAB — CBG MONITORING, ED
Glucose-Capillary: 109 mg/dL — ABNORMAL HIGH (ref 70–99)
Glucose-Capillary: 88 mg/dL (ref 70–99)

## 2020-10-26 LAB — PREPARE RBC (CROSSMATCH)

## 2020-10-26 LAB — GLUCOSE, CAPILLARY
Glucose-Capillary: 70 mg/dL (ref 70–99)
Glucose-Capillary: 46 mg/dL — ABNORMAL LOW (ref 70–99)
Glucose-Capillary: 81 mg/dL (ref 70–99)

## 2020-10-26 LAB — SEDIMENTATION RATE: Sed Rate: 50 mm/hr — ABNORMAL HIGH (ref 0–16)

## 2020-10-26 MED ORDER — GADOBUTROL 1 MMOL/ML IV SOLN
10.0000 mL | Freq: Once | INTRAVENOUS | Status: AC | PRN
  Administered 2020-10-26: 10 mL via INTRAVENOUS

## 2020-10-26 MED ORDER — MELATONIN 3 MG PO TABS
6.0000 mg | ORAL_TABLET | Freq: Every evening | ORAL | Status: DC | PRN
  Administered 2020-10-28: 6 mg via ORAL
  Filled 2020-10-26: qty 2

## 2020-10-26 MED ORDER — SODIUM CHLORIDE 0.9% IV SOLUTION: Freq: Once | INTRAVENOUS | Status: AC

## 2020-10-26 MED ORDER — ACETAMINOPHEN 325 MG PO TABS
650.0000 mg | ORAL_TABLET | Freq: Once | ORAL | Status: AC
  Administered 2020-10-26: 650 mg via ORAL
  Filled 2020-10-26: qty 2

## 2020-10-26 MED ORDER — DIPHENHYDRAMINE HCL 25 MG PO CAPS
25.0000 mg | ORAL_CAPSULE | Freq: Once | ORAL | Status: AC
  Administered 2020-10-26: 25 mg via ORAL
  Filled 2020-10-26: qty 1

## 2020-10-26 MED ORDER — FUROSEMIDE 10 MG/ML IJ SOLN
20.0000 mg | Freq: Once | INTRAMUSCULAR | Status: AC
  Administered 2020-10-26: 20 mg via INTRAVENOUS
  Filled 2020-10-26: qty 2

## 2020-10-26 NOTE — Consult Note (Signed)
[facility]  Patient name: Mitchell [name] MRN: [id number] DOB: February 04, 1950 Sex: male   REQUESTING PROVIDER:    Hospital service   REASON FOR CONSULT:    Diabetic foot ulcer  HISTORY OF PRESENT ILLNESS:   Mitchell [name] is a 70 y.o. male, who presented to the emergency department with a 4-day history of drainage and swelling from his right foot.  He also noticed an odor coming from his foot.  He has been on antibiotics for several weeks for this ulcer.  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 Darrick Penna without intervention.  He does have peripheral neuropathy.  The patient does note black stools and was found to have a low hemoglobin today and was transfused.  He underwent a right foot MRI which showed a abscess as well as osteomyelitis of the remaining toes with the exception of the great toe.  He also had gas within the foot.  The patient is a diabetic.  He takes Neurontin for peripheral neuropathy.  He is a current smoker.  He is on a statin for hypercholesterolemia  PAST MEDICAL HISTORY    Past Medical History:  Diagnosis Date   Arthritis    Cellulitis of right lower extremity 05/09/2014   Diabetes mellitus without complication (HCC)    Diabetic foot ulcer (HCC) 07/07/2014   Status post bedside debridement of multiple right plantar ulcers by podiatrist, Dr. Reynolds Bowl.   Hypercholesterolemia    Maggot infestation    right foot ulcer   Obesity 05/09/2014   Tobacco abuse 07/07/2014     FAMILY HISTORY   No family history on file.  SOCIAL HISTORY:   Social History   Socioeconomic History   Marital status: Divorced    Spouse name: Not on file   Number of children: Not on file   Years of education: Not on file   Highest education level: Not on file  Occupational History   Not on file  Tobacco Use   Smoking status: Every Day    Packs/day: 1.50     Types: Cigarettes   Smokeless tobacco: Never  Substance and Sexual Activity   Alcohol use: Yes    Alcohol/week: 0.0 standard drinks    Comment: occ-twice a week beer approx 2   Drug use: No   Sexual activity: Not on file  Other Topics Concern   Not on file  Social History Narrative   Not on file   Social Determinants of Health   Financial Resource Strain: Not on file  Food Insecurity: Not on file  Transportation Needs: Not on file  Physical Activity: Not on file  Stress: Not on file  Social Connections: Not on file  Intimate Partner Violence: Not on file    ALLERGIES:    Allergies  Allergen Reactions   Penicillins Itching and Rash    Has patient had a PCN reaction causing immediate rash, facial/tongue/throat swelling, SOB or lightheadedness with hypotension: Yes Has patient had a PCN reaction causing severe rash involving mucus membranes or skin necrosis: No Has patient had a PCN reaction that required hospitalization No Has patient had a PCN reaction occurring within the last 10 years: No If all of the above answers are "NO", then may proceed with Cephalosporin use.     CURRENT MEDICATIONS:    Current Facility-Administered Medications  Medication Dose Route Frequency Provider Last Rate Last Admin   acetaminophen (TYLENOL) tablet 650 mg  650 mg Oral Q6H PRN Carollee Herter, DO       Or   acetaminophen (TYLENOL) suppository 650 mg  650 mg Rectal Q6H PRN Carollee Herter, DO       cefTRIAXone (ROCEPHIN) 2 g in sodium chloride 0.9 % 100 mL IVPB  2 g Intravenous Q24H Carollee Herter, DO   Stopped at 10/26/20 0106   empagliflozin (JARDIANCE) tablet 25 mg  25 mg Oral Daily Carollee Herter, DO   25 mg at 10/26/20 1700   gabapentin (NEURONTIN) capsule 300 mg  300 mg Oral TID Carollee Herter, DO   300 mg at 10/26/20 1657   insulin aspart (novoLOG) injection 0-5 Units  0-5 Units Subcutaneous QHS Carollee Herter, DO       insulin aspart (novoLOG) injection 0-9 Units  0-9 Units Subcutaneous TID WC Carollee Herter,  DO       insulin aspart (novoLOG) injection 15 Units  15 Units Subcutaneous Q breakfast Carollee Herter, DO   15 Units at 10/26/20 0853   insulin aspart (novoLOG) injection 30 Units  30 Units Subcutaneous BID Elon Jester, DO   30 Units at 10/26/20 1657   insulin glargine-yfgn (SEMGLEE) injection 55 Units  55 Units Subcutaneous Daily Carollee Herter, DO   55 Units at 10/26/20 1104   melatonin tablet 6 mg  6 mg Oral QHS PRN Valrie Hart A, RPH       metroNIDAZOLE (FLAGYL) tablet 500 mg  500 mg Oral Q12H Carollee Herter, DO   500 mg at 10/26/20 1749   nicotine (NICODERM CQ - dosed in mg/24 hours) patch 21 mg  21 mg Transdermal Daily Carollee Herter, DO   21 mg at 10/26/20 1003   ondansetron (ZOFRAN) tablet 4 mg  4 mg Oral Q6H PRN Carollee Herter, DO       Or   ondansetron [facility]) injection 4 mg  4 mg Intravenous Q6H PRN Carollee Herter, DO       pantoprozole (PROTONIX) 80 mg /NS 100 mL infusion  8 mg/hr Intravenous Continuous Carollee Herter, DO 10 mL/hr at 10/26/20 1804 8 mg/hr at 10/26/20 1804   pravastatin (PRAVACHOL) tablet 20 mg  20 mg Oral q1800 Carollee Herter, DO   20 mg at 10/26/20 1657   vancomycin (VANCOREADY) IVPB 1500 mg/300 mL  1,500 mg Intravenous Q12H Carollee Herter, DO 150 mL/hr at 10/26/20 1657 1,500 mg at 10/26/20 1657    REVIEW OF SYSTEMS:   [X]  denotes positive finding, [ ]  denotes negative finding Cardiac  Comments:  Chest pain or chest pressure:    Shortness of breath upon exertion:    Short of breath when lying flat:    Irregular heart rhythm:        Vascular    Pain in calf, thigh, or hip brought on by ambulation:    Pain in feet at night that wakes you up from your sleep:     Blood clot in your veins:    Leg swelling:         Pulmonary    Oxygen at home:    Productive cough:     Wheezing:         Neurologic    Sudden weakness in arms or legs:     Sudden numbness in arms or legs:     Sudden onset of difficulty speaking or slurred speech:    Temporary loss of vision in one eye:     Problems with  dizziness:         Gastrointestinal  Blood in stool:      Vomited blood:         Genitourinary    Burning when urinating:     Blood in urine:        Psychiatric    Major depression:         Hematologic    Bleeding problems:    Problems with blood clotting too easily:        Skin    Rashes or ulcers: x       Constitutional    Fever or chills:     PHYSICAL EXAM:   Vitals:   10/26/20 1315 10/26/20 1316 10/26/20 1400 10/26/20 1542  BP: 140/70 140/70 130/62 (!) 136/59  Pulse: 63 63 62 67  Resp: (!) 21 (!) 21 (!) 21 20  Temp:  98.4 F (36.9 C)  97.9 F (36.6 C)  TempSrc:      SpO2: 97% 97% 96% 100%  Weight:      Height:        GENERAL: The patient is a well-nourished male, in no acute distress. The vital signs are documented above. CARDIAC: There is a regular rate and rhythm.  VASCULAR: Palpable right dorsalis pedis pulse PULMONARY: Nonlabored respirations ABDOMEN: Soft and non-tender with normal pitched bowel sounds.  MUSCULOSKELETAL: There are no major deformities or cyanosis. NEUROLOGIC: No focal weakness or paresthesias are detected. SKIN: See photo below. PSYCHIATRIC: The patient has a normal affect.     STUDIES:   I have reviewed his MRI with the following findings: 1. Acute osteomyelitis of the fourth metatarsal and proximal phalanx of the fourth toe. 2. Plantar soft tissue ulceration underlying the level of the fourth MTP joint with 3.1 cm abscess surrounding the fourth metatarsal head and fourth MTP joint. Additional 2.1 cm abscess overlies the dorsal aspect of the fourth MTP joint. 3. Bone marrow edema within the fifth toe and fifth metatarsal head without confluent low T1 signal changes. Findings may reflect reactive osteitis. Early acute osteomyelitis at this site is difficult to exclude 4. Acute or subacute fracture involving the second toe proximal phalanx. 5. Extensive circumferential subcutaneous edema, likely cellulitis. 6. Findings of  muscle denervation and/or myositis throughout the forefoot.  ABIs were performed which were normal however waveforms were slightly abnormal ASSESSMENT and PLAN   Severe diabetic foot infection: I discussed with the patient and his son via telephone that he has osteomyelitis as well as a forefoot abscess.  He is going to require at minimum a transmetatarsal amputation.  His ABIs were normal and he has a palpable dorsalis pedis pulse, therefore I do not think additional arterial evaluation is indicated.  I have recommended proceeding with transmetatarsal amputation tomorrow.  He understands that the wound will likely have to be left open and that he is at risk for more proximal amputation.  All questions were answered.  He will be n.p.o. after midnight.  Please continue IV antibiotics.   Charlena Cross, MD, FACS [facility] [phone] Pager [phone]

## 2020-10-26 NOTE — H&P (View-Only) (Signed)
 [facility]  Patient name: Mitchell [name] MRN: [id number] DOB: 07/05/1950 Sex: male   REQUESTING PROVIDER:    Hospital service   REASON FOR CONSULT:    Diabetic foot ulcer  HISTORY OF PRESENT ILLNESS:   Mitchell [name] is a 70 y.o. male, who presented to the emergency department with a 4-day history of drainage and swelling from his right foot.  He also noticed an odor coming from his foot.  He has been on antibiotics for several weeks for this ulcer.  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 Fields without intervention.  He does have peripheral neuropathy.  The patient does note black stools and was found to have a low hemoglobin today and was transfused.  He underwent a right foot MRI which showed a abscess as well as osteomyelitis of the remaining toes with the exception of the great toe.  He also had gas within the foot.  The patient is a diabetic.  He takes Neurontin for peripheral neuropathy.  He is a current smoker.  He is on a statin for hypercholesterolemia  PAST MEDICAL HISTORY    Past Medical History:  Diagnosis Date   Arthritis    Cellulitis of right lower extremity 05/09/2014   Diabetes mellitus without complication (HCC)    Diabetic foot ulcer (HCC) 07/07/2014   Status post bedside debridement of multiple right plantar ulcers by podiatrist, Dr. Lu.   Hypercholesterolemia    Maggot infestation    right foot ulcer   Obesity 05/09/2014   Tobacco abuse 07/07/2014     FAMILY HISTORY   No family history on file.  SOCIAL HISTORY:   Social History   Socioeconomic History   Marital status: Divorced    Spouse name: Not on file   Number of children: Not on file   Years of education: Not on file   Highest education level: Not on file  Occupational History   Not on file  Tobacco Use   Smoking status: Every Day    Packs/day: 1.50     Types: Cigarettes   Smokeless tobacco: Never  Substance and Sexual Activity   Alcohol use: Yes    Alcohol/week: 0.0 standard drinks    Comment: occ-twice a week beer approx 2   Drug use: No   Sexual activity: Not on file  Other Topics Concern   Not on file  Social History Narrative   Not on file   Social Determinants of Health   Financial Resource Strain: Not on file  Food Insecurity: Not on file  Transportation Needs: Not on file  Physical Activity: Not on file  Stress: Not on file  Social Connections: Not on file  Intimate Partner Violence: Not on file    ALLERGIES:    Allergies  Allergen Reactions   Penicillins Itching and Rash    Has patient had a PCN reaction causing immediate rash, facial/tongue/throat swelling, SOB or lightheadedness with hypotension: Yes Has patient had a PCN reaction causing severe rash involving mucus membranes or skin necrosis: No Has patient had a PCN reaction that required hospitalization No Has patient had a PCN reaction occurring within the last 10 years: No If all of the above answers are "NO", then may proceed with Cephalosporin use.     CURRENT MEDICATIONS:    Current Facility-Administered Medications  Medication Dose Route Frequency Provider Last Rate Last Admin   acetaminophen (TYLENOL) tablet 650 mg    650 mg Oral Q6H PRN Chen, Eric, DO       Or   acetaminophen (TYLENOL) suppository 650 mg  650 mg Rectal Q6H PRN Chen, Eric, DO       cefTRIAXone (ROCEPHIN) 2 g in sodium chloride 0.9 % 100 mL IVPB  2 g Intravenous Q24H Chen, Eric, DO   Stopped at 10/26/20 0106   empagliflozin (JARDIANCE) tablet 25 mg  25 mg Oral Daily Chen, Eric, DO   25 mg at 10/26/20 0959   gabapentin (NEURONTIN) capsule 300 mg  300 mg Oral TID Chen, Eric, DO   300 mg at 10/26/20 1657   insulin aspart (novoLOG) injection 0-5 Units  0-5 Units Subcutaneous QHS Chen, Eric, DO       insulin aspart (novoLOG) injection 0-9 Units  0-9 Units Subcutaneous TID WC Chen, Eric,  DO       insulin aspart (novoLOG) injection 15 Units  15 Units Subcutaneous Q breakfast Chen, Eric, DO   15 Units at 10/26/20 0853   insulin aspart (novoLOG) injection 30 Units  30 Units Subcutaneous BID AC Chen, Eric, DO   30 Units at 10/26/20 1657   insulin glargine-yfgn (SEMGLEE) injection 55 Units  55 Units Subcutaneous Daily Chen, Eric, DO   55 Units at 10/26/20 1104   melatonin tablet 6 mg  6 mg Oral QHS PRN Hall, Scott A, RPH       metroNIDAZOLE (FLAGYL) tablet 500 mg  500 mg Oral Q12H Chen, Eric, DO   500 mg at 10/26/20 0959   nicotine (NICODERM CQ - dosed in mg/24 hours) patch 21 mg  21 mg Transdermal Daily Chen, Eric, DO   21 mg at 10/26/20 1003   ondansetron (ZOFRAN) tablet 4 mg  4 mg Oral Q6H PRN Chen, Eric, DO       Or   ondansetron (ZOFRAN) injection 4 mg  4 mg Intravenous Q6H PRN Chen, Eric, DO       pantoprozole (PROTONIX) 80 mg /NS 100 mL infusion  8 mg/hr Intravenous Continuous Chen, Eric, DO 10 mL/hr at 10/26/20 1804 8 mg/hr at 10/26/20 1804   pravastatin (PRAVACHOL) tablet 20 mg  20 mg Oral q1800 Chen, Eric, DO   20 mg at 10/26/20 1657   vancomycin (VANCOREADY) IVPB 1500 mg/300 mL  1,500 mg Intravenous Q12H Chen, Eric, DO 150 mL/hr at 10/26/20 1657 1,500 mg at 10/26/20 1657    REVIEW OF SYSTEMS:   [X] denotes positive finding, [ ] denotes negative finding Cardiac  Comments:  Chest pain or chest pressure:    Shortness of breath upon exertion:    Short of breath when lying flat:    Irregular heart rhythm:        Vascular    Pain in calf, thigh, or hip brought on by ambulation:    Pain in feet at night that wakes you up from your sleep:     Blood clot in your veins:    Leg swelling:         Pulmonary    Oxygen at home:    Productive cough:     Wheezing:         Neurologic    Sudden weakness in arms or legs:     Sudden numbness in arms or legs:     Sudden onset of difficulty speaking or slurred speech:    Temporary loss of vision in one eye:     Problems with  dizziness:         Gastrointestinal      Blood in stool:      Vomited blood:         Genitourinary    Burning when urinating:     Blood in urine:        Psychiatric    Major depression:         Hematologic    Bleeding problems:    Problems with blood clotting too easily:        Skin    Rashes or ulcers: x       Constitutional    Fever or chills:     PHYSICAL EXAM:   Vitals:   10/26/20 1315 10/26/20 1316 10/26/20 1400 10/26/20 1542  BP: 140/70 140/70 130/62 (!) 136/59  Pulse: 63 63 62 67  Resp: (!) 21 (!) 21 (!) 21 20  Temp:  98.4 F (36.9 C)  97.9 F (36.6 C)  TempSrc:      SpO2: 97% 97% 96% 100%  Weight:      Height:        GENERAL: The patient is a well-nourished male, in no acute distress. The vital signs are documented above. CARDIAC: There is a regular rate and rhythm.  VASCULAR: Palpable right dorsalis pedis pulse PULMONARY: Nonlabored respirations ABDOMEN: Soft and non-tender with normal pitched bowel sounds.  MUSCULOSKELETAL: There are no major deformities or cyanosis. NEUROLOGIC: No focal weakness or paresthesias are detected. SKIN: See photo below. PSYCHIATRIC: The patient has a normal affect.     STUDIES:   I have reviewed his MRI with the following findings: 1. Acute osteomyelitis of the fourth metatarsal and proximal phalanx of the fourth toe. 2. Plantar soft tissue ulceration underlying the level of the fourth MTP joint with 3.1 cm abscess surrounding the fourth metatarsal head and fourth MTP joint. Additional 2.1 cm abscess overlies the dorsal aspect of the fourth MTP joint. 3. Bone marrow edema within the fifth toe and fifth metatarsal head without confluent low T1 signal changes. Findings may reflect reactive osteitis. Early acute osteomyelitis at this site is difficult to exclude 4. Acute or subacute fracture involving the second toe proximal phalanx. 5. Extensive circumferential subcutaneous edema, likely cellulitis. 6. Findings of  muscle denervation and/or myositis throughout the forefoot.  ABIs were performed which were normal however waveforms were slightly abnormal ASSESSMENT and PLAN   Severe diabetic foot infection: I discussed with the patient and his son via telephone that he has osteomyelitis as well as a forefoot abscess.  He is going to require at minimum a transmetatarsal amputation.  His ABIs were normal and he has a palpable dorsalis pedis pulse, therefore I do not think additional arterial evaluation is indicated.  I have recommended proceeding with transmetatarsal amputation tomorrow.  He understands that the wound will likely have to be left open and that he is at risk for more proximal amputation.  All questions were answered.  He will be n.p.o. after midnight.  Please continue IV antibiotics.   Charlena Cross, MD, FACS [facility] [phone] Pager [phone]

## 2020-10-26 NOTE — Progress Notes (Signed)
RN Secure chatted Dr. Dayna Barker and notified him that the patient had arrived on the unit. Spoke with Hyacinth Meeker, PA with [facility] and notified about pt's arrival to the unit. Paged Vascular Dr. Lear Ng awaiting call back.

## 2020-10-26 NOTE — ED Notes (Signed)
Pt bed linens and gown changed due to leaking purewick. Pt male purewick replaced.

## 2020-10-26 NOTE — ED Notes (Signed)
Pt taken to mri.  Iv meds paused for MRI

## 2020-10-26 NOTE — Consult Note (Signed)
WOC Nurse Consult Note: Patient receiving care in [facility] ED 4. Reason for Consult: Right foot diabetic wound Wound type: neuropathic, likely with osteomyelitis Pressure Injury POA: Yes/No/NA Measurement: To be provided by the bedside RN in the flowsheet section  Wound bed: see photo Drainage (amount, consistency, odor) heavy, was in wound care center 10/25/20 and was sent to the ED for further evaluation. Periwound: erythematous, edematous  Xray results from 10/25/20:  "Possible acute lytic destruction involving distal fourth metatarsal concerning for osteomyelitis. Gas is seen in the adjacent soft tissues suggesting cellulitis."   Dressing procedure/placement/frequency: Daily application of Aquacel [place] [phone]) into the wound, dry gauze, and secure with kerlix. WOC nurse will not follow at this time.  Please re-consult the WOC team if needed.  Helmut Muster, RN, MSN, CWOCN, CNS-BC, pager [phone]

## 2020-10-26 NOTE — Progress Notes (Signed)
PROGRESS NOTE    Patient: Mitchell [name]                            PCP: Kirstie Peri, MD                    DOB: May 19, 1950            DOA: 10/25/2020 KPT:[id number]             DOS: 10/26/2020, 2:05 PM   LOS: 1 day   Date of Service: The patient was seen and examined on 10/26/2020  Subjective:   The patient was seen and examined this morning. Stable at this time. Still complaining of : Foot pain, generalized weaknesses, Otherwise no issues overnight .  Brief Narrative:   70 yo WM with hx of type 2 DM, on insulin, HTN, tobacco abuse, history of peripheral vascular disease, obesity who presents to the ER today due to 3 to 4-day history of right foot drainage, right foot swelling.  Patient has a history of diabetic foot ulcers.  Patient states that over the last 3 to 4 days he has noticed increasing swelling, drainage of his foot.  Denies any fever or chills.  He has noticed an odor coming from his foot.  Patient has been followed by outpatient rehab for about a month due to an open sore on his foot.  He has been on Bactrim antibiotics.  Reporting a progressive darkened stool over the past 2 to 3 weeks, now black in the past 3 days. On arrival in ED hemodynamically stable, with hemoglobin of 6.1 Hemoccult positive Potassium 5.2, lactic acid 1.4, WBC 7.5 1 unit PRBC ordered. Right foot xray suggestive of osteomyelitis.    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  -  MRI right foot with and without  contrast.  - Check ABI with toe pressures.   Follow inflammatory markers.   Blood loss anemia 1 unit of PRBC initiated, another unit will be ordered (total of 3 units PRBC) Hemoccult positive Monitor H&H closely Gastroenterologist consulted at [facility]    Diabetic ulcer of right foot (HCC) Continue with IV ABx.  Discussed with the vascular surgeon at [facility] with proceed with ABI, arteriogram studies  -Further plan per vascular surgery team   GI bleed +hemoccult in the ER. Pt takes only ASA. On po protonix. May need GI consult.   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 6.3  Continue with insulin. Hold metformin. CBG q. ACH S, SSI coverage    Obesity Body mass index is 34.71 kg/m. Advise regarding weight loss-healthy diet   Peripheral neuropathy Chronic. Continue with neurontin. Stable   Tobacco abuse Nicotine patch offered. Stable    --------------------------------------------------------------------------------------------------------------------------  DVT prophylaxis: Lovenox Code Status: Full Code Family Communication: no family at bedside  Disposition Plan: return home with home health versus SNF Consults called: Gastroenterologist/vascular surgery Discussed with  gastroenterologist at [facility], discussed with vascular surgeon   Admission status:   Level of care: Med-Surg     -------------------------------------------------------------------------------------------------------------------------------- Cultures; Blood Cultures x 2 >>   Antimicrobials: IV rocephin, flagyl and IV Vanco.      Procedures:   No admission procedures for hospital encounter.    Antimicrobials:  Anti-infectives (From admission, onward)    Start     Dose/Rate Route Frequency Ordered Stop   10/26/20 0400  vancomycin (VANCOREADY) IVPB 1500 mg/300 mL        1,500 mg 150 mL/hr over 120 Minutes Intravenous Every 12 hours 10/25/20 1602     10/25/20  2200  metroNIDAZOLE (FLAGYL) tablet 500 mg        500 mg Oral Every 12 hours 10/25/20 2009     10/25/20 2015  cefTRIAXone (ROCEPHIN) 2 g in sodium chloride 0.9 % 100 mL IVPB        2 g 200 mL/hr over 30 Minutes Intravenous Every 24 hours 10/25/20 2009     10/25/20 1430  ciprofloxacin (CIPRO) IVPB 400 mg        400 mg 200 mL/hr over 60 Minutes Intravenous  Once 10/25/20 1424 10/25/20 1631   10/25/20 1430  clindamycin (CLEOCIN) IVPB 600 mg        600 mg 100 mL/hr over 30 Minutes Intravenous  Once 10/25/20 1424 10/25/20 1631   10/25/20 1430  vancomycin (VANCOREADY) IVPB 2000 mg/400 mL        2,000 mg 200 mL/hr over 120 Minutes Intravenous  Once 10/25/20 1428 10/25/20 1727        Medication:   empagliflozin  25 mg Oral Daily   gabapentin  300 mg Oral TID   insulin aspart  0-5 Units Subcutaneous QHS   insulin aspart  0-9 Units Subcutaneous TID WC   insulin aspart  15 Units Subcutaneous Q breakfast   insulin aspart  30 Units Subcutaneous BID AC   insulin glargine-yfgn  55 Units Subcutaneous Daily   metroNIDAZOLE  500 mg Oral Q12H   nicotine  21 mg Transdermal Daily   pravastatin  20 mg Oral q1800    acetaminophen **OR** acetaminophen, melatonin, ondansetron **OR** ondansetron (ZOFRAN) IV   Objective:   Vitals:   10/26/20 1135 10/26/20 1153 10/26/20 1315 10/26/20 1316  BP: 137/65 (!) 121/53 140/70 140/70  Pulse: 75 70 63 63  Resp: (!) 22 (!) 23 (!) 21 (!) 21  Temp: 98.1 F (36.7 C) 97.8 F (36.6 C)  98.4 F (36.9 C)  TempSrc:      SpO2: 96% 95% 97% 97%  Weight:      Height:        Intake/Output Summary (Last 24 hours) at 10/26/2020 1405 Last data filed at 10/26/2020 1316 Gross per 24 hour  Intake 2501.71 ml  Output --  Net 2501.71 ml   Filed Weights   10/25/20 1347  Weight: 112.9 kg     Examination:   Physical Exam  Constitution:  Alert, cooperative, no distress,  Appears calm and comfortable  Psychiatric: Normal and stable mood and affect, cognition  intact,   HEENT: Normocephalic, PERRL, otherwise with in Normal limits  Chest:Chest symmetric Cardio vascular:  S1/S2, RRR, No murmure, No Rubs or Gallops  pulmonary: Clear to auscultation bilaterally, respirations unlabored, negative wheezes / crackles Abdomen: Soft, non-tender, non-distended, bowel sounds,no masses, no organomegaly Muscular skeletal: Limited exam - in bed, able to move all 4 extremities, Normal strength,  Neuro: CNII-XII intact. , normal motor and sensation, reflexes  intact  Extremities: No pitting edema lower extremities, +2 pulses  Skin: Dry, warm to touch, negative for any Rashes, No open wounds Wounds: per nursing documentation             LABs:  CBC Latest Ref Rng & Units 10/26/2020 10/25/2020 10/25/2020  WBC 4.0 - 10.5 K/uL - - 7.5  Hemoglobin 13.0 - 17.0 g/dL 7.7(L) 6.1(LL) 6.1(LL)  Hematocrit 39.0 - 52.0 % 31.0(L) 26.1(L) 25.6(L)  Platelets 150 - 400 K/uL - - 572(H)   CMP Latest Ref Rng & Units 10/25/2020 07/22/2015 08/29/2014  Glucose 70 - 99 mg/dL 932(T) 557(D) 220(U)  BUN 8 - 23 mg/dL 14 10 15   Creatinine 0.61 - 1.24 mg/dL 5.42 7.06  Sodium 135 - 145 mmol/L 136 141 140  Potassium 3.5 - 5.1 mmol/L 5.2(H) 3.9 3.9  Chloride 98 - 111 mmol/L 99 103 102  CO2 22 - 32 mmol/L 30 - 28  Calcium 8.9 - 10.3 mg/dL 2.37) - 8.6(L)  Total Protein 6.5 - 8.1 g/dL 7.5 - -  Total Bilirubin 0.3 - 1.2 mg/dL 0.7 - -  Alkaline Phos 38 - 126 U/L 115 - -  AST 15 - 41 U/L 13(L) - -  ALT 0 - 44 U/L 9 - -       Micro Results Recent Results (from the past 240 hour(s))  Culture, blood (routine x 2)     Status: None (Preliminary result)   Collection Time: 10/25/20  2:45 PM   Specimen: Right Antecubital; Blood  Result Value Ref Range Status   Specimen Description RIGHT ANTECUBITAL  Final   Special Requests   Final    BOTTLES DRAWN AEROBIC AND ANAEROBIC Blood Culture results may not be optimal due to an excessive volume of blood received in culture bottles    Culture   Final    NO GROWTH < 24 HOURS Performed at [facility], [address] [place], [place]    Report Status PENDING  Incomplete  Culture, blood (routine x 2)     Status: None (Preliminary result)   Collection Time: 10/25/20  2:45 PM   Specimen: Left Antecubital; Blood  Result Value Ref Range Status   Specimen Description LEFT ANTECUBITAL  Final   Special Requests   Final    BOTTLES DRAWN AEROBIC AND ANAEROBIC Blood Culture results may not be optimal due to an excessive volume of blood received in culture bottles   Culture   Final    NO GROWTH < 24 HOURS Performed at [facility], [address] [place], [place]    Report Status PENDING  Incomplete  Resp Panel by RT-PCR (Flu A&B, Covid) Nasopharyngeal Swab     Status: None   Collection Time: 10/25/20  3:45 PM   Specimen: Nasopharyngeal Swab; Nasopharyngeal(NP) swabs in vial transport medium  Result Value Ref Range Status   SARS Coronavirus 2 by RT PCR NEGATIVE NEGATIVE Final    Comment: (NOTE) SARS-CoV-2 target nucleic acids are NOT DETECTED.  The SARS-CoV-2 RNA is generally detectable in upper respiratory specimens during the acute phase of infection. The lowest concentration of SARS-CoV-2 viral copies this assay can detect is 138 copies/mL. A negative result does not preclude SARS-Cov-2 infection and should not be used as the sole basis for treatment or other patient management decisions. A negative result may occur with  improper specimen collection/handling, submission of specimen other than nasopharyngeal swab, presence of viral mutation(s) within the areas targeted by this assay, and inadequate number of  viral copies(<138 copies/mL). A negative result must be combined with clinical observations, patient history, and epidemiological information. The expected result is Negative.  Fact Sheet for Patients:  BloggerCourse.com  Fact Sheet for Healthcare Providers:   SeriousBroker.it  This test is no t yet approved or cleared by the [place] FDA and  has been authorized for detection and/or diagnosis of SARS-CoV-2 by FDA under an Emergency Use Authorization (EUA). This EUA will remain  in effect (meaning this test can be used) for the duration of the COVID-19 declaration under Section 564(b)(1) of the Act, 21 U.S.C.section 360bbb-3(b)(1), unless the authorization is terminated  or revoked sooner.       Influenza A by PCR NEGATIVE NEGATIVE Final   Influenza B by PCR NEGATIVE NEGATIVE Final    Comment: (NOTE) The Xpert Xpress SARS-CoV-2/FLU/RSV plus assay is intended as an aid in the diagnosis of influenza from Nasopharyngeal swab specimens and should not be used as a sole basis for treatment. Nasal washings and aspirates are unacceptable for Xpert Xpress SARS-CoV-2/FLU/RSV testing.  Fact Sheet for Patients: BloggerCourse.com  Fact Sheet for Healthcare Providers: SeriousBroker.it  This test is not yet approved or cleared by the [place] FDA and has been authorized for detection and/or diagnosis of SARS-CoV-2 by FDA under an Emergency Use Authorization (EUA). This EUA will remain in effect (meaning this test can be used) for the duration of the COVID-19 declaration under Section 564(b)(1) of the Act, 21 U.S.C. section 360bbb-3(b)(1), unless the authorization is terminated or revoked.  Performed at [facility], [address], [place], [place] [address]   Aerobic Culture w Gram Stain (superficial specimen)     Status: None (Preliminary result)   Collection Time: 10/25/20  3:55 PM   Specimen: Foot; Wound  Result Value Ref Range Status   Specimen Description   Final    FOOT Performed at [facility], [address] [place], [place] [address]    Special Requests   Final    RIGHT Performed at [facility], [address] [place], [place] [address]     Gram Stain   Final    NO SQUAMOUS EPITHELIAL CELLS SEEN FEW WBC SEEN FEW GRAM NEGATIVE RODS MODERATE GRAM POSITIVE COCCI    Culture   Final    CULTURE REINCUBATED FOR BETTER GROWTH Performed at [facility], [address], [place], [place] [address]    Report Status PENDING  Incomplete    Radiology Reports MR FOOT RIGHT W WO CONTRAST  Result Date: 10/26/2020 CLINICAL DATA:  Nonhealing wounds. Diabetic foot ulcer. Concern for osteomyelitis. EXAM: MRI OF THE RIGHT FOREFOOT WITHOUT AND WITH CONTRAST TECHNIQUE: Multiplanar, multisequence MR imaging of the right forefoot was performed before and after the administration of intravenous contrast. CONTRAST:  92mL GADAVIST GADOBUTROL 1 MMOL/ML IV SOLN COMPARISON:  X-ray 10/25/2020 FINDINGS: Bones/Joint/Cartilage Postsurgical changes of third toe amputation at the third MTP joint level. The fourth MTP joint is dorsally dislocated. Marked bone marrow edema and enhancement with confluent low T1 signal change throughout the proximal phalanx of the fourth toe and throughout the fourth metatarsal extending from the level of the metatarsal head to the proximal metaphysis, compatible with acute osteomyelitis. Complex fourth MTP joint effusion compatible with septic arthritis. Preservation of the marrow signal within the middle and distal phalanx of fourth toe. Fifth toe hammertoe deformity. There is bone marrow edema within the fifth toe and fifth metatarsal head without confluent low T1 signal changes. Acute or subacute fracture involving the second  toe proximal phalanx without significant displacement (series 11, image 20). Degenerative changes of the great toe IP joint. Marrow signal within the first ray is maintained. No bony abnormality is seen within the midfoot. Ligaments Intact Lisfranc ligament. Muscles and Tendons Findings of denervation and/or myositis throughout the forefoot. No significant tenosynovial fluid collection. Soft tissues Soft tissue  ulceration underlying the level of the fourth MTP joint with rim enhancing air and fluid collection surrounding the fourth metatarsal head and fourth MTP joint measuring 3.1 x 1.5 x 1.9 cm (series 13, image 18). Additional adjacent abscess overlies the dorsal aspect of the fourth MTP joint measuring 2.1 x 0.9 x 2.0 cm (series 14, image 12). Extensive circumferential subcutaneous edema. IMPRESSION: 1. Acute osteomyelitis of the fourth metatarsal and proximal phalanx of the fourth toe. 2. Plantar soft tissue ulceration underlying the level of the fourth MTP joint with 3.1 cm abscess surrounding the fourth metatarsal head and fourth MTP joint. Additional 2.1 cm abscess overlies the dorsal aspect of the fourth MTP joint. 3. Bone marrow edema within the fifth toe and fifth metatarsal head without confluent low T1 signal changes. Findings may reflect reactive osteitis. Early acute osteomyelitis at this site is difficult to exclude 4. Acute or subacute fracture involving the second toe proximal phalanx. 5. Extensive circumferential subcutaneous edema, likely cellulitis. 6. Findings of muscle denervation and/or myositis throughout the forefoot. Electronically Signed   By: Duanne Guess D.O.   On: 10/26/2020 08:18   US ARTERIAL ABI (SCREENING LOWER EXTREMITY)  Result Date: 10/26/2020 CLINICAL DATA:  Peripheral vascular disease EXAM: NONINVASIVE PHYSIOLOGIC VASCULAR STUDY OF BILATERAL LOWER EXTREMITIES TECHNIQUE: Evaluation of both lower extremities were performed at rest, including calculation of ankle-brachial indices with single level Doppler, pressure and pulse volume recording. COMPARISON:  None. FINDINGS: Right ABI:  1.1 Left ABI:  1.1 Right Lower Extremity:  Abnormal monophasic arterial waveforms. Left Lower Extremity: Abnormal monophasic posterior tibial arterial waveform. Dorsalis pedis waveform is biphasic. 1.0-1.4 Normal IMPRESSION: Technically, the resting bilateral ankle-brachial indices are within normal  limits. However, the waveforms are abnormal in suggest upstream obstructive disease. The ABIs may be falsely elevated secondary to poor compressibility of the vessels in the setting of chronic medial calcinosis. If clinical concern for significant peripheral arterial disease persists, consider multilevel segmental noninvasive evaluation or CT arteriogram with runoff. Signed, Sterling Big, MD, RPVI [facility] Radiology Electronically Signed   By: Malachy Moan M.D.   On: 10/26/2020 09:15   DG Foot Complete Right  Result Date: 10/25/2020 CLINICAL DATA:  Right foot infection. EXAM: RIGHT FOOT COMPLETE - 3+ VIEW COMPARISON:  January 14, 2019. FINDINGS: Status post amputation of third toe. Defect is seen involving distal portions of second, third and fifth metatarsals consistent with old osteomyelitis. There is possible lytic destruction involving the distal fourth metatarsal concerning for osteomyelitis. Gas is seen in the adjacent soft tissues suggesting cellulitis. Severe swelling of the second and fourth toes is noted IMPRESSION: Chronic changes as described above. Possible acute lytic destruction involving distal fourth metatarsal concerning for osteomyelitis. Gas is seen in the adjacent soft tissues suggesting cellulitis. Electronically Signed   By: Lupita Raider M.D.   On: 10/25/2020 14:30    SIGNED: Kendell Bane, MD, FHM. [facility],  Pager (please use [email or web address] to page/text) Please use Epic Secure Chat for non-urgent communication (7AM-7PM)  If 7PM-7AM, please contact night-coverage [email or web address], 10/26/2020, 2:05 PM

## 2020-10-27 ENCOUNTER — Inpatient Hospital Stay (HOSPITAL_COMMUNITY): Payer: Medicare HMO | Admitting: Anesthesiology

## 2020-10-27 ENCOUNTER — Encounter (HOSPITAL_COMMUNITY)
Admission: EM | Disposition: A | Discharge status: Home / Self Care | Payer: Self-pay | Source: Home / Self Care | Attending: Internal Medicine

## 2020-10-27 ENCOUNTER — Ambulatory Visit (HOSPITAL_COMMUNITY): Payer: Medicare HMO | Admitting: Physical Therapy

## 2020-10-27 ENCOUNTER — Encounter (HOSPITAL_COMMUNITY): Payer: Self-pay | Admitting: Internal Medicine

## 2020-10-27 DIAGNOSIS — E1169 Type 2 diabetes mellitus with other specified complication: Secondary | ICD-10-CM

## 2020-10-27 DIAGNOSIS — M868X7 Other osteomyelitis, ankle and foot: Secondary | ICD-10-CM | POA: Diagnosis not present

## 2020-10-27 DIAGNOSIS — L02611 Cutaneous abscess of right foot: Secondary | ICD-10-CM

## 2020-10-27 HISTORY — PX: TRANSMETATARSAL AMPUTATION: SHX6197

## 2020-10-27 HISTORY — PX: APPLICATION OF WOUND VAC: SHX5189

## 2020-10-27 LAB — TYPE AND SCREEN
ABO/RH(D): O POS
Antibody Screen: NEGATIVE
Unit division: 0
Unit division: 0
Unit division: 0
Unit division: 0

## 2020-10-27 LAB — BPAM RBC
Blood Product Expiration Date: 202211102359
Blood Product Expiration Date: 202211102359
Blood Product Expiration Date: 202211152359
Blood Product Expiration Date: 202211152359
ISSUE DATE / TIME: 202210111655
ISSUE DATE / TIME: 202210112145
ISSUE DATE / TIME: 202210120906
ISSUE DATE / TIME: 202210121128
Unit Type and Rh: 5100
Unit Type and Rh: 5100
Unit Type and Rh: 5100
Unit Type and Rh: 5100

## 2020-10-27 LAB — GLUCOSE, CAPILLARY
Glucose-Capillary: 127 mg/dL — ABNORMAL HIGH (ref 70–99)
Glucose-Capillary: 116 mg/dL — ABNORMAL HIGH (ref 70–99)
Glucose-Capillary: 100 mg/dL — ABNORMAL HIGH (ref 70–99)
Glucose-Capillary: 88 mg/dL (ref 70–99)
Glucose-Capillary: 142 mg/dL — ABNORMAL HIGH (ref 70–99)
Glucose-Capillary: 169 mg/dL — ABNORMAL HIGH (ref 70–99)
Glucose-Capillary: 124 mg/dL — ABNORMAL HIGH (ref 70–99)

## 2020-10-27 LAB — BASIC METABOLIC PANEL
Anion gap: 8 (ref 5–15)
BUN: 16 mg/dL (ref 8–23)
CO2: 26 mmol/L (ref 22–32)
Calcium: 8.3 mg/dL — ABNORMAL LOW (ref 8.9–10.3)
Chloride: 101 mmol/L (ref 98–111)
Creatinine, Ser: 0.82 mg/dL (ref 0.61–1.24)
GFR, Estimated: >60 mL/min (ref >60)
Glucose, Bld: 163 mg/dL — ABNORMAL HIGH (ref 70–99)
Potassium: 4.7 mmol/L (ref 3.5–5.1)
Sodium: 135 mmol/L (ref 135–145)

## 2020-10-27 LAB — CBC
HCT: 36.1 % — ABNORMAL LOW (ref 39.0–52.0)
Hemoglobin: 9.8 g/dL — ABNORMAL LOW (ref 13.0–17.0)
MCH: 18.8 pg — ABNORMAL LOW (ref 26.0–34.0)
MCHC: 27.1 g/dL — ABNORMAL LOW (ref 30.0–36.0)
MCV: 69.4 fL — ABNORMAL LOW (ref 80.0–100.0)
Platelets: 492 10*3/uL — ABNORMAL HIGH (ref 150–400)
RBC: 5.2 MIL/uL (ref 4.22–5.81)
RDW: 30.8 % — ABNORMAL HIGH (ref 11.5–15.5)
WBC: 10.3 10*3/uL (ref 4.0–10.5)
nRBC: 0.2 % (ref 0.0–0.2)

## 2020-10-27 SURGERY — AMPUTATION, FOOT, TRANSMETATARSAL
Anesthesia: Regional | Site: Toe | Laterality: Right

## 2020-10-27 MED ORDER — INSULIN ASPART 100 UNIT/ML IJ SOLN
5.0000 [IU] | Freq: Three times a day (TID) | INTRAMUSCULAR | Status: DC
  Administered 2020-10-27 – 2020-11-04 (×22): 5 [IU] via SUBCUTANEOUS

## 2020-10-27 MED ORDER — ORAL CARE MOUTH RINSE: 15.0000 mL | Freq: Once | OROMUCOSAL | Status: AC

## 2020-10-27 MED ORDER — 0.9 % SODIUM CHLORIDE (POUR BTL) OPTIME
TOPICAL | Status: DC | PRN
  Administered 2020-10-27: 1000 mL

## 2020-10-27 MED ORDER — LIDOCAINE 2% (20 MG/ML) 5 ML SYRINGE
INTRAMUSCULAR | Status: DC | PRN
  Administered 2020-10-27: 40 mg via INTRAVENOUS

## 2020-10-27 MED ORDER — FENTANYL CITRATE (PF) 100 MCG/2ML IJ SOLN: 25.0000 ug | INTRAMUSCULAR | Status: DC | PRN

## 2020-10-27 MED ORDER — CHLORHEXIDINE GLUCONATE 0.12 % MT SOLN
OROMUCOSAL | Status: AC
  Administered 2020-10-27: 15 mL via OROMUCOSAL
  Filled 2020-10-27: qty 15

## 2020-10-27 MED ORDER — LACTATED RINGERS IV SOLN: INTRAVENOUS | Status: DC

## 2020-10-27 MED ORDER — PHENYLEPHRINE 40 MCG/ML (10ML) SYRINGE FOR IV PUSH (FOR BLOOD PRESSURE SUPPORT)
PREFILLED_SYRINGE | INTRAVENOUS | Status: DC | PRN
  Administered 2020-10-27 (×3): 80 ug via INTRAVENOUS

## 2020-10-27 MED ORDER — PHENYLEPHRINE 40 MCG/ML (10ML) SYRINGE FOR IV PUSH (FOR BLOOD PRESSURE SUPPORT)
PREFILLED_SYRINGE | INTRAVENOUS | Status: AC
  Filled 2020-10-27: qty 10

## 2020-10-27 MED ORDER — PHENYLEPHRINE HCL-NACL 20-0.9 MG/250ML-% IV SOLN
INTRAVENOUS | Status: DC | PRN
  Administered 2020-10-27: 25 ug/min via INTRAVENOUS

## 2020-10-27 MED ORDER — ONDANSETRON HCL 4 MG/2ML IJ SOLN
INTRAMUSCULAR | Status: AC
  Filled 2020-10-27: qty 2

## 2020-10-27 MED ORDER — MIDAZOLAM HCL 5 MG/5ML IJ SOLN
INTRAMUSCULAR | Status: DC | PRN
  Administered 2020-10-27: 2 mg via INTRAVENOUS

## 2020-10-27 MED ORDER — FENTANYL CITRATE (PF) 100 MCG/2ML IJ SOLN
INTRAMUSCULAR | Status: DC | PRN
  Administered 2020-10-27: 100 ug via INTRAVENOUS

## 2020-10-27 MED ORDER — VANCOMYCIN HCL IN DEXTROSE 1-5 GM/200ML-% IV SOLN
1000.0000 mg | Freq: Two times a day (BID) | INTRAVENOUS | Status: DC
  Administered 2020-10-28 – 2020-10-30 (×5): 1000 mg via INTRAVENOUS
  Filled 2020-10-27 (×5): qty 200

## 2020-10-27 MED ORDER — LIDOCAINE 2% (20 MG/ML) 5 ML SYRINGE
INTRAMUSCULAR | Status: AC
  Filled 2020-10-27: qty 5

## 2020-10-27 MED ORDER — CHLORHEXIDINE GLUCONATE 0.12 % MT SOLN: 15.0000 mL | Freq: Once | OROMUCOSAL | Status: AC

## 2020-10-27 MED ORDER — ACETAMINOPHEN 10 MG/ML IV SOLN: 1000.0000 mg | Freq: Once | INTRAVENOUS | Status: DC | PRN

## 2020-10-27 MED ORDER — INSULIN GLARGINE-YFGN 100 UNIT/ML ~~LOC~~ SOLN
15.0000 [IU] | Freq: Every day | SUBCUTANEOUS | Status: DC
  Administered 2020-10-27 – 2020-11-04 (×8): 15 [IU] via SUBCUTANEOUS
  Filled 2020-10-27 (×9): qty 0.15

## 2020-10-27 MED ORDER — ROPIVACAINE HCL 5 MG/ML IJ SOLN
INTRAMUSCULAR | Status: DC | PRN
  Administered 2020-10-27: 20 mL via PERINEURAL

## 2020-10-27 MED ORDER — ONDANSETRON HCL 4 MG/2ML IJ SOLN
INTRAMUSCULAR | Status: DC | PRN
  Administered 2020-10-27: 4 mg via INTRAVENOUS

## 2020-10-27 MED ORDER — BUPIVACAINE-EPINEPHRINE (PF) 0.5% -1:200000 IJ SOLN
INTRAMUSCULAR | Status: DC | PRN
  Administered 2020-10-27: 30 mL via PERINEURAL

## 2020-10-27 MED ORDER — PROPOFOL 10 MG/ML IV BOLUS
INTRAVENOUS | Status: DC | PRN
  Administered 2020-10-27: 140 mg via INTRAVENOUS

## 2020-10-27 SURGICAL SUPPLY — 46 items
BAG COUNTER SPONGE SURGICOUNT (BAG) ×2 IMPLANT
BENZOIN TINCTURE PRP APPL 2/3 (GAUZE/BANDAGES/DRESSINGS) ×1 IMPLANT
BLADE CORE FAN STRYKER (BLADE) ×1 IMPLANT
BNDG CONFORM 3 STRL LF (GAUZE/BANDAGES/DRESSINGS) IMPLANT
BNDG ELASTIC 4X5.8 VLCR STR LF (GAUZE/BANDAGES/DRESSINGS) ×2 IMPLANT
BNDG GAUZE ELAST 4 BULKY (GAUZE/BANDAGES/DRESSINGS) ×2 IMPLANT
CANISTER SUCT 3000ML PPV (MISCELLANEOUS) ×3 IMPLANT
CANISTER WOUND CARE 500ML ATS (WOUND CARE) ×1 IMPLANT
COVER SURGICAL LIGHT HANDLE (MISCELLANEOUS) ×3 IMPLANT
DRAPE EXTREMITY T 121X128X90 (DISPOSABLE) ×3 IMPLANT
DRAPE HALF SHEET 40X57 (DRAPES) ×3 IMPLANT
DRAPE INCISE IOBAN 66X45 STRL (DRAPES) ×1 IMPLANT
DRSG VAC ATS MED SENSATRAC (GAUZE/BANDAGES/DRESSINGS) ×1 IMPLANT
ELECT REM PT RETURN 9FT ADLT (ELECTROSURGICAL) ×3
ELECTRODE REM PT RTRN 9FT ADLT (ELECTROSURGICAL) ×2 IMPLANT
GAUZE SPONGE 4X4 12PLY STRL (GAUZE/BANDAGES/DRESSINGS) ×2 IMPLANT
GLOVE SRG 8 PF TXTR STRL LF DI (GLOVE) ×2 IMPLANT
GLOVE SURG POLYISO LF SZ7.5 (GLOVE) ×3 IMPLANT
GLOVE SURG UNDER POLY LF SZ8 (GLOVE) ×1
GOWN STRL REUS W/ TWL LRG LVL3 (GOWN DISPOSABLE) ×4 IMPLANT
GOWN STRL REUS W/ TWL XL LVL3 (GOWN DISPOSABLE) ×2 IMPLANT
GOWN STRL REUS W/TWL LRG LVL3 (GOWN DISPOSABLE) ×2
GOWN STRL REUS W/TWL XL LVL3 (GOWN DISPOSABLE) ×1
KIT BASIN OR (CUSTOM PROCEDURE TRAY) ×3 IMPLANT
KIT TURNOVER KIT B (KITS) ×3 IMPLANT
NDL HYPO 25GX1X1/2 BEV (NEEDLE) IMPLANT
NEEDLE HYPO 25GX1X1/2 BEV (NEEDLE) IMPLANT
NS IRRIG 1000ML POUR BTL (IV SOLUTION) ×3 IMPLANT
PACK GENERAL/GYN (CUSTOM PROCEDURE TRAY) ×3 IMPLANT
PAD ARMBOARD 7.5X6 YLW CONV (MISCELLANEOUS) ×6 IMPLANT
SPECIMEN JAR SMALL (MISCELLANEOUS) ×3 IMPLANT
SPONGE T-LAP 18X18 ~~LOC~~+RFID (SPONGE) ×2 IMPLANT
SUT ETHILON 1 LR 30 (SUTURE) IMPLANT
SUT ETHILON 2 0 PSLX (SUTURE) ×11 IMPLANT
SUT ETHILON 3 0 PS 1 (SUTURE) ×3 IMPLANT
SUT VIC AB 2-0 CT1 27 (SUTURE) ×1
SUT VIC AB 2-0 CT1 TAPERPNT 27 (SUTURE) IMPLANT
SUT VIC AB 3-0 SH 27 (SUTURE) ×1
SUT VIC AB 3-0 SH 27X BRD (SUTURE) IMPLANT
SWAB COLLECTION DEVICE MRSA (MISCELLANEOUS) ×1 IMPLANT
SWAB CULTURE ESWAB REG 1ML (MISCELLANEOUS) ×1 IMPLANT
SYR CONTROL 10ML LL (SYRINGE) IMPLANT
TOWEL GREEN STERILE (TOWEL DISPOSABLE) ×6 IMPLANT
TOWEL GREEN STERILE FF (TOWEL DISPOSABLE) ×3 IMPLANT
UNDERPAD 30X36 HEAVY ABSORB (UNDERPADS AND DIAPERS) ×3 IMPLANT
WATER STERILE IRR 1000ML POUR (IV SOLUTION) ×3 IMPLANT

## 2020-10-27 NOTE — Progress Notes (Addendum)
PROGRESS NOTE    Mitchell [name]  VCB:[id number] DOB: 1950-09-02 DOA: 10/25/2020 PCP: Kirstie Peri, MD   Chief Complaint  Patient presents with   Foot Pain   Brief Narrative/Hospital Course: 70 year old male with T2DM on insulin, HTN, tobacco abuse PVD obesity, history of diabetic foot ulcers presented to [facility] with 3 days 4-day history of right foot redness swelling and drainage.  Patient has been on Bactrim antibiotics and following with outpatient rehab for a month due to open sore on his foot.  He also noticed progressive darkening of his systolic breast 2 to 3 weeks and now black 3 days prior to admission. Hemoccult positive hemodynamically stable hemoglobin 6.1 g potassium 5.2 lactic acid 1.4 WBC 7.5, 1 unit PRBC transfused right foot x-ray showed osteomyelitis Patient was admitted on IV antibiotics ultrasound arterial ABI with resting bilateral ankle (normal limits waveforms were abnormal suggestive of upstream obstructive disease.  MRI of the right foot showed acute osteomyelitis of the fourth metatarsal and proximal phalanx of the fourth toe, plantar soft tissue ulceration with 3.1 cm abscess surrounding the fourth metatarsal head and fourth MTP joint, 2.1 cm abscess overlies the dorsal aspect of the fourth MTP joint, BM ED via on fifth July and fifth metatarsal sustained reactive osteitis, acute/subacute fracture of second toe proximal phalanx, extensive circumferential edema likely cellulitis.  Also muscle denervation/myositis throughout the forefoot. 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 No pain. Underwent surgery by vascular this am Hypoglycemia 46 last night  Assessment & Plan:  Severe diabetic foot infection with osteomyelitis and abscess forefoot: See MRI results for more detail.  ABI normal.  Vascular surgery performed OR w/  transmetatarsal amputation today- has wound vac in place. Given normal ABI no invasive arterial work-up advised. We will continue with current antibiotic vancomycin/ceftriaxone/flagly and follow-up OR culture and blood culture.  Continue pain control DVT prophylaxis  Recent Labs  Lab 10/25/20 1445 10/25/20 1654 10/27/20 0238  WBC 7.5  --  10.3  LATICACIDVEN 1.4 1.3  --    Acute blood loss anemia likely GI bleeding  Anemia of chronic disease  Black stool and Hemoccult positive stool: Status post total 3 units PRBC hemoglobin stabilized.  GI has been consulted- possible egd/colo. Continue on IV PPI gtt. Monitor h/h. Recent Labs  Lab 10/25/20 1445 10/25/20 1550 10/26/20 0347 10/26/20 1635 10/27/20 0238  HGB 6.1* 6.1* 7.7* 9.7* 9.8*  HCT 25.6* 26.1* 31.0* 36.3* 36.1*    T2DM on long-term insulin: With hypoglycemia 46. stable HbA1c 6.3.  Blood sugar stable this morning-change to 5 units Premeal NovoLog and Lantus 15 units daily hold po meds, keep on ssi Recent Labs  Lab 10/26/20 2107 10/27/20 0737 10/27/20 0837 10/27/20 1205 10/27/20 1241  GLUCAP 81 127* 116* 100* 88   Thrombocytosis 492 likely reactive.  Peripheral neuropathy-continue Neurontin  Tobacco abuse: Continue nicotine patch  Class I Obesity:Patient's Body mass index is 34.71 kg/m. : Will benefit with PCP follow-up, weight loss healthy lifestyle and outpatient sleep evaluation  DVT prophylaxis: SCDs Start: 10/25/20 2010 Code Status:   Code Status: Full Code Family Communication: plan of care discussed with patient at bedside. Status is: Inpatient Remains inpatient appropriate because: Ongoing management of his infection with IV antibiotics  Dispo: The patient is from: Home              Anticipated d/c is to: Home  Patient currently is not medically stable to d/c.   Difficult to place patient Yes Objective: Vitals: Today's Vitals   10/27/20 1200 10/27/20 1215 10/27/20 1228 10/27/20 1230  BP:  137/63 123/63 (!) 141/60 (!) 141/60  Pulse: 75 72 74 72  Resp: 13 18 (!) 24 (!) 22  Temp: 97.9 F (36.6 C)   97.9 F (36.6 C)  TempSrc:      SpO2: 94% 96% 96% 96%  Weight:      Height:      PainSc: 0-No pain   0-No pain   Physical Examination: General exam: AA0x3, weak,older than stated age. HEENT:Oral mucosa moist, Ear/Nose WNL grossly,dentition normal. Respiratory system: B/l clear BS, no use of accessory muscle, non tender. Cardiovascular system: S1 & S2 +,No JVD. Gastrointestinal system: Abdomen soft, NT,ND, BS+. Nervous System:Alert, awake, moving extremities. Extremities: edema none, RLE with  forefoot amputation wound vac in place. Skin: No rashes, no icterus. MSK: Normal muscle bulk, tone, power.  Medications reviewed:  Scheduled Meds:  empagliflozin  25 mg Oral Daily   gabapentin  300 mg Oral TID   insulin aspart  0-5 Units Subcutaneous QHS   insulin aspart  0-9 Units Subcutaneous TID WC   insulin aspart  5 Units Subcutaneous TID WC   insulin glargine-yfgn  15 Units Subcutaneous Daily   metroNIDAZOLE  500 mg Oral Q12H   nicotine  21 mg Transdermal Daily   pravastatin  20 mg Oral q1800   Continuous Infusions:  cefTRIAXone (ROCEPHIN)  IV 2 g (10/26/20 2150)   pantoprazole 8 mg/hr (10/27/20 0502)   vancomycin 1,500 mg (10/27/20 0432)   Diet Order             Diet NPO time specified  Diet effective midnight           Diet Carb Modified Fluid consistency: Thin; Room service appropriate? Yes  Diet effective now                   Intake/Output  Intake/Output Summary (Last 24 hours) at 10/27/2020 1356 Last data filed at 10/27/2020 1130 Gross per 24 hour  Intake 2533.9 ml  Output 1325 ml  Net 1208.9 ml   Intake/Output from previous day: 10/12 0701 - 10/13 0700 In: 2523.9 [P.O.:480; I.V.:667.6; Blood:690; IV Piggyback:686.3] Out: 1200 [Urine:1200] Net IO Since Admission: 3,710.61 mL [10/27/20 1356]   Weight change:   Wt Readings from Last 3  Encounters:  10/27/20 112.9 kg  07/22/15 112.5 kg  07/15/15 112.5 kg     Consultants:see note  Procedures:see note Antimicrobials: Anti-infectives (From admission, onward)    Start     Dose/Rate Route Frequency Ordered Stop   10/26/20 0400  vancomycin (VANCOREADY) IVPB 1500 mg/300 mL        1,500 mg 150 mL/hr over 120 Minutes Intravenous Every 12 hours 10/25/20 1602     10/25/20 2200  metroNIDAZOLE (FLAGYL) tablet 500 mg        500 mg Oral Every 12 hours 10/25/20 2009     10/25/20 2015  cefTRIAXone (ROCEPHIN) 2 g in sodium chloride 0.9 % 100 mL IVPB        2 g 200 mL/hr over 30 Minutes Intravenous Every 24 hours 10/25/20 2009     10/25/20 1430  ciprofloxacin (CIPRO) IVPB 400 mg        400 mg 200 mL/hr over 60 Minutes Intravenous  Once 10/25/20 1424 10/25/20 1631   10/25/20 1430  clindamycin (CLEOCIN) IVPB 600 mg  600 mg 100 mL/hr over 30 Minutes Intravenous  Once 10/25/20 1424 10/25/20 1631   10/25/20 1430  vancomycin (VANCOREADY) IVPB 2000 mg/400 mL        2,000 mg 200 mL/hr over 120 Minutes Intravenous  Once 10/25/20 1428 10/25/20 1727      Culture/Microbiology    Component Value Date/Time   SDES  10/25/2020 1555    FOOT Performed at [facility], [address], [place], [place] [address]    [facility]  10/25/2020 1555    RIGHT Performed at [facility], [address] [place], [place] [address]    CULT  10/25/2020 1555    ABUNDANT STREPTOCOCCUS AGALACTIAE TESTING AGAINST S. AGALACTIAE NOT ROUTINELY PERFORMED DUE TO PREDICTABILITY OF AMP/PEN/VAN SUSCEPTIBILITY. Performed at [facility], [address], [place], [place] [address]    REPTSTATUS PENDING 10/25/2020 1555    Other culture-see note  Unresulted Labs (From admission, onward)     Start     Ordered   10/27/20 1108  Aerobic/Anaerobic Culture w Gram Stain (surgical/deep wound)  RELEASE UPON ORDERING,   TIMED       Comments: Specimen A: Phone [phone] Immunocompromised?  No  Antibiotic  Treatment:  Rocephin, Vancomycin Is the patient on airborne/droplet precautions? No Clinical History:  Osteomyelitis right foot Special Instructions:  none Specimen Disposition:  Microbiology     10/27/20 1108   10/27/20 0500  CBC  Daily,   R      10/26/20 0717   10/27/20 0500  Basic metabolic panel  Daily,   R      10/26/20 0717   Pending  Type and screen [facility]  Once,   STAT       Comments: [facility]    Pending          Data Reviewed: I have personally reviewed following labs and imaging studies CBC: Recent Labs  Lab 10/25/20 1445 10/25/20 1550 10/26/20 0347 10/26/20 1635 10/27/20 0238  WBC 7.5  --   --   --  10.3  NEUTROABS 4.9  --   --   --   --   HGB 6.1* 6.1* 7.7* 9.7* 9.8*  HCT 25.6* 26.1* 31.0* 36.3* 36.1*  MCV 63.8*  --   --   --  69.4*  PLT 572*  --   --   --  492*   Basic Metabolic Panel: Recent Labs  Lab 10/25/20 1445 10/27/20 0238  NA 136 135  K 5.2* 4.7  CL 99 101  CO2 30 26  GLUCOSE 134* 163*  BUN 14 16  CREATININE 0.84 0.82  CALCIUM 8.5* 8.3*   GFR: Estimated Creatinine Clearance: 107.1 mL/min (by C-G formula based on SCr of 0.82 mg/dL). Liver Function Tests: Recent Labs  Lab 10/25/20 1445  AST 13*  ALT 9  ALKPHOS 115  BILITOT 0.7  PROT 7.5  ALBUMIN 2.8*   No results for input(s): LIPASE, AMYLASE in the last 168 hours. No results for input(s): AMMONIA in the last 168 hours. Coagulation Profile: Recent Labs  Lab 10/25/20 1550  INR 1.2   Cardiac Enzymes: No results for input(s): CKTOTAL, CKMB, CKMBINDEX, TROPONINI in the last 168 hours. BNP (last 3 results) No results for input(s): PROBNP in the last 8760 hours. HbA1C: Recent Labs    10/25/20 1550  HGBA1C 6.3*   CBG: Recent Labs  Lab 10/26/20 2107 10/27/20 0737 10/27/20 0837 10/27/20 1205 10/27/20 1241  GLUCAP 81 127* 116* 100* 88   Lipid  Profile: No results for input(s): CHOL, HDL, LDLCALC, TRIG, CHOLHDL, LDLDIRECT in  the last 72 hours. Thyroid Function Tests: No results for input(s): TSH, T4TOTAL, FREET4, T3FREE, THYROIDAB in the last 72 hours. Anemia Panel: Recent Labs    10/25/20 1445  VITAMINB12 265  FOLATE 8.9  FERRITIN 5*  TIBC 410  IRON 9*  RETICCTPCT 2.2   Sepsis Labs: Recent Labs  Lab 10/25/20 1445 10/25/20 1654  LATICACIDVEN 1.4 1.3    Recent Results (from the past 240 hour(s))  Culture, blood (routine x 2)     Status: None (Preliminary result)   Collection Time: 10/25/20  2:45 PM   Specimen: Right Antecubital; Blood  Result Value Ref Range Status   Specimen Description RIGHT ANTECUBITAL  Final   Special Requests   Final    BOTTLES DRAWN AEROBIC AND ANAEROBIC Blood Culture results may not be optimal due to an excessive volume of blood received in culture bottles   Culture   Final    NO GROWTH 2 DAYS Performed at [facility], [address] [place], [place] [address]    Report Status PENDING  Incomplete  Culture, blood (routine x 2)     Status: None (Preliminary result)   Collection Time: 10/25/20  2:45 PM   Specimen: Left Antecubital; Blood  Result Value Ref Range Status   Specimen Description LEFT ANTECUBITAL  Final   Special Requests   Final    BOTTLES DRAWN AEROBIC AND ANAEROBIC Blood Culture results may not be optimal due to an excessive volume of blood received in culture bottles   Culture   Final    NO GROWTH 2 DAYS Performed at [facility], [address] [place], [place] [address]    Report Status PENDING  Incomplete  Resp Panel by RT-PCR (Flu A&B, Covid) Nasopharyngeal Swab     Status: None   Collection Time: 10/25/20  3:45 PM   Specimen: Nasopharyngeal Swab; Nasopharyngeal(NP) swabs in vial transport medium  Result Value Ref Range Status   SARS Coronavirus 2 by RT PCR NEGATIVE NEGATIVE Final    Comment: (NOTE) SARS-CoV-2 target nucleic acids are NOT DETECTED.  The SARS-CoV-2 RNA is generally detectable in upper respiratory specimens during the acute  phase of infection. The lowest concentration of SARS-CoV-2 viral copies this assay can detect is 138 copies/mL. A negative result does not preclude SARS-Cov-2 infection and should not be used as the sole basis for treatment or other patient management decisions. A negative result may occur with  improper specimen collection/handling, submission of specimen other than nasopharyngeal swab, presence of viral mutation(s) within the areas targeted by this assay, and inadequate number of viral copies(<138 copies/mL). A negative result must be combined with clinical observations, patient history, and epidemiological information. The expected result is Negative.  Fact Sheet for Patients:  BloggerCourse.com  Fact Sheet for Healthcare Providers:  SeriousBroker.it  This test is no t yet approved or cleared by the [place] FDA and  has been authorized for detection and/or diagnosis of SARS-CoV-2 by FDA under an Emergency Use Authorization (EUA). This EUA will remain  in effect (meaning this test can be used) for the duration of the COVID-19 declaration under Section 564(b)(1) of the Act, 21 U.S.C.section 360bbb-3(b)(1), unless the authorization is terminated  or revoked sooner.       Influenza A by PCR NEGATIVE NEGATIVE Final   Influenza B by PCR NEGATIVE NEGATIVE Final    Comment: (NOTE) The Xpert Xpress SARS-CoV-2/FLU/RSV plus assay is intended as an aid  in the diagnosis of influenza from Nasopharyngeal swab specimens and should not be used as a sole basis for treatment. Nasal washings and aspirates are unacceptable for Xpert Xpress SARS-CoV-2/FLU/RSV testing.  Fact Sheet for Patients: BloggerCourse.com  Fact Sheet for Healthcare Providers: SeriousBroker.it  This test is not yet approved or cleared by the [place] FDA and has been authorized for detection and/or diagnosis of  SARS-CoV-2 by FDA under an Emergency Use Authorization (EUA). This EUA will remain in effect (meaning this test can be used) for the duration of the COVID-19 declaration under Section 564(b)(1) of the Act, 21 U.S.C. section 360bbb-3(b)(1), unless the authorization is terminated or revoked.  Performed at [facility], [address], [place], [place] [address]   Aerobic Culture w Gram Stain (superficial specimen)     Status: None (Preliminary result)   Collection Time: 10/25/20  3:55 PM   Specimen: Foot; Wound  Result Value Ref Range Status   Specimen Description   Final    FOOT Performed at [facility], [address], [place], [place] [address]    Special Requests   Final    RIGHT Performed at [facility], [address], [place], [place] [address]    Gram Stain   Final    NO SQUAMOUS EPITHELIAL CELLS SEEN FEW WBC SEEN FEW GRAM NEGATIVE RODS MODERATE GRAM POSITIVE COCCI    Culture   Final    ABUNDANT STREPTOCOCCUS AGALACTIAE TESTING AGAINST S. AGALACTIAE NOT ROUTINELY PERFORMED DUE TO PREDICTABILITY OF AMP/PEN/VAN SUSCEPTIBILITY. Performed at [facility], [address], [place], [place] [address]    Report Status PENDING  Incomplete     Radiology Studies: MR FOOT RIGHT W WO CONTRAST  Result Date: 10/26/2020 CLINICAL DATA:  Nonhealing wounds. Diabetic foot ulcer. Concern for osteomyelitis. EXAM: MRI OF THE RIGHT FOREFOOT WITHOUT AND WITH CONTRAST TECHNIQUE: Multiplanar, multisequence MR imaging of the right forefoot was performed before and after the administration of intravenous contrast. CONTRAST:  84mL GADAVIST GADOBUTROL 1 MMOL/ML IV SOLN COMPARISON:  X-ray 10/25/2020 FINDINGS: Bones/Joint/Cartilage Postsurgical changes of third toe amputation at the third MTP joint level. The fourth MTP joint is dorsally dislocated. Marked bone marrow edema and enhancement with confluent low T1 signal change throughout the proximal phalanx of the fourth toe and throughout the fourth  metatarsal extending from the level of the metatarsal head to the proximal metaphysis, compatible with acute osteomyelitis. Complex fourth MTP joint effusion compatible with septic arthritis. Preservation of the marrow signal within the middle and distal phalanx of fourth toe. Fifth toe hammertoe deformity. There is bone marrow edema within the fifth toe and fifth metatarsal head without confluent low T1 signal changes. Acute or subacute fracture involving the second toe proximal phalanx without significant displacement (series 11, image 20). Degenerative changes of the great toe IP joint. Marrow signal within the first ray is maintained. No bony abnormality is seen within the midfoot. Ligaments Intact Lisfranc ligament. Muscles and Tendons Findings of denervation and/or myositis throughout the forefoot. No significant tenosynovial fluid collection. Soft tissues Soft tissue ulceration underlying the level of the fourth MTP joint with rim enhancing air and fluid collection surrounding the fourth metatarsal head and fourth MTP joint measuring 3.1 x 1.5 x 1.9 cm (series 13, image 18). Additional adjacent abscess overlies the dorsal aspect of the fourth MTP joint measuring 2.1 x 0.9 x 2.0 cm (series 14, image 12). Extensive circumferential subcutaneous edema. IMPRESSION: 1. Acute osteomyelitis of the fourth metatarsal and proximal phalanx of the fourth  toe. 2. Plantar soft tissue ulceration underlying the level of the fourth MTP joint with 3.1 cm abscess surrounding the fourth metatarsal head and fourth MTP joint. Additional 2.1 cm abscess overlies the dorsal aspect of the fourth MTP joint. 3. Bone marrow edema within the fifth toe and fifth metatarsal head without confluent low T1 signal changes. Findings may reflect reactive osteitis. Early acute osteomyelitis at this site is difficult to exclude 4. Acute or subacute fracture involving the second toe proximal phalanx. 5. Extensive circumferential subcutaneous edema,  likely cellulitis. 6. Findings of muscle denervation and/or myositis throughout the forefoot. Electronically Signed   By: Duanne Guess D.O.   On: 10/26/2020 08:18   US ARTERIAL ABI (SCREENING LOWER EXTREMITY)  Result Date: 10/26/2020 CLINICAL DATA:  Peripheral vascular disease EXAM: NONINVASIVE PHYSIOLOGIC VASCULAR STUDY OF BILATERAL LOWER EXTREMITIES TECHNIQUE: Evaluation of both lower extremities were performed at rest, including calculation of ankle-brachial indices with single level Doppler, pressure and pulse volume recording. COMPARISON:  None. FINDINGS: Right ABI:  1.1 Left ABI:  1.1 Right Lower Extremity:  Abnormal monophasic arterial waveforms. Left Lower Extremity: Abnormal monophasic posterior tibial arterial waveform. Dorsalis pedis waveform is biphasic. 1.0-1.4 Normal IMPRESSION: Technically, the resting bilateral ankle-brachial indices are within normal limits. However, the waveforms are abnormal in suggest upstream obstructive disease. The ABIs may be falsely elevated secondary to poor compressibility of the vessels in the setting of chronic medial calcinosis. If clinical concern for significant peripheral arterial disease persists, consider multilevel segmental noninvasive evaluation or CT arteriogram with runoff. Signed, Sterling Big, MD, RPVI [facility] Radiology Electronically Signed   By: Malachy Moan M.D.   On: 10/26/2020 09:15   DG Foot Complete Right  Result Date: 10/25/2020 CLINICAL DATA:  Right foot infection. EXAM: RIGHT FOOT COMPLETE - 3+ VIEW COMPARISON:  January 14, 2019. FINDINGS: Status post amputation of third toe. Defect is seen involving distal portions of second, third and fifth metatarsals consistent with old osteomyelitis. There is possible lytic destruction involving the distal fourth metatarsal concerning for osteomyelitis. Gas is seen in the adjacent soft tissues suggesting cellulitis. Severe swelling  of the second and fourth toes is noted IMPRESSION: Chronic changes as described above. Possible acute lytic destruction involving distal fourth metatarsal concerning for osteomyelitis. Gas is seen in the adjacent soft tissues suggesting cellulitis. Electronically Signed   By: Lupita Raider M.D.   On: 10/25/2020 14:30     LOS: 2 days   Lanae Boast, MD [facility]  10/27/2020, 1:56 PM

## 2020-10-27 NOTE — Progress Notes (Signed)
Pharmacy Antibiotic Note  Mitchell [name] is a 70 y.o. 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  Pharmacy has been consulted for vancomycin dosing. Also on ceftriaxone and metronidazole per team.  10/13 Vancomycin 1000mg  Q 12 hr Scr used: 0.8 mg/dL Weight: kg Vd coeff: 0.5 L/kg Est AUC: 440  Given patient received 1.5g Q12 hr x3 doses and a loading dose, will skip next dose and resume 1g q12hr tomorrow.  Plan: Vancomycin 1g Q12 hr -start 10/14  Vanc level 10/15-10/16  if continuing  Ceftriaxone and metronidazole per team  Monitor cultures, clinical status, renal fx Narrow abx as able and f/u duration    Height: 5\' 11"  (180.3 cm) Weight: 112.9 kg (248 lb 14.4 oz) IBW/kg (Calculated) : 75.3  Temp (24hrs), Avg:98 F (36.7 C), Min:97.8 F (36.6 C), Max:98.2 F (36.8 C)  Recent Labs  Lab 10/25/20 1445 10/25/20 1654 10/27/20 0238  WBC 7.5  --  10.3  CREATININE 0.84  --  0.82  LATICACIDVEN 1.4 1.3  --     Estimated Creatinine Clearance: 107.1 mL/min (by C-G formula based on SCr of 0.82 mg/dL).    Allergies  Allergen Reactions   Penicillins Itching and Rash    Has patient had a PCN reaction causing immediate rash, facial/tongue/throat swelling, SOB or lightheadedness with hypotension: Yes Has patient had a PCN reaction causing severe rash involving mucus membranes or skin necrosis: No Has patient had a PCN reaction that required hospitalization No Has patient had a PCN reaction occurring within the last 10 years: No If all of the above answers are "NO", then may proceed with Cephalosporin use.     Antimicrobials this admission: PTA bactrim 9/27>> 10/11 Clindamycin 600mg  IV x 1   Cipro 400mg  IV x 1   Vancomycin  10/11 >>   Ceftriaxone 10/11>> Flagyl 10/11>>  Dose adjustments this admission: 10/13 Vanc 1.5g q12 hr> 1g q12 hr  Microbiology results: 10/11 BCx:  ngtd 10/11 wound cx: few GNR, abundant strep agalactiae  10/13 OR culture: pending    Thank you for allowing pharmacy to be a part of this patient's care.  11/13 10/27/2020 2:07 PM

## 2020-10-27 NOTE — Anesthesia Postprocedure Evaluation (Signed)
Anesthesia Post Note  Patient: Mitchell [name]  Procedure(s) Performed: TRANSMETATARSAL AMPUTATION (Right: Toe) APPLICATION OF WOUND VAC (Right: Foot)     Patient location during evaluation: PACU Anesthesia Type: Regional and General Level of consciousness: awake and alert Pain management: pain level controlled Vital Signs Assessment: post-procedure vital signs reviewed and stable Respiratory status: spontaneous breathing, nonlabored ventilation, respiratory function stable and patient connected to nasal cannula oxygen Cardiovascular status: blood pressure returned to baseline and stable Postop Assessment: no apparent nausea or vomiting Anesthetic complications: no   No notable events documented.  Last Vitals:  Vitals:   10/27/20 1228 10/27/20 1230  BP: (!) 141/60 (!) 141/60  Pulse: 74 72  Resp: (!) 24 (!) 22  Temp:  36.6 C  SpO2: 96% 96%    Last Pain:  Vitals:   10/27/20 1230  TempSrc:   PainSc: 0-No pain                 Shelton Silvas

## 2020-10-27 NOTE — Transfer of Care (Signed)
Immediate Anesthesia Transfer of Care Note  Patient: Mitchell [name]  Procedure(s) Performed: TRANSMETATARSAL AMPUTATION (Right: Toe) APPLICATION OF WOUND VAC (Right: Foot)  Patient Location: PACU  Anesthesia Type:GA combined with regional for post-op pain  Level of Consciousness: awake, alert  and oriented  Airway & Oxygen Therapy: Patient Spontanous Breathing  Post-op Assessment: Report given to RN and Post -op Vital signs reviewed and stable  Post vital signs: Reviewed and stable  Last Vitals:  Vitals Value Taken Time  BP 137/63 10/27/20 1157  Temp    Pulse 72 10/27/20 1201  Resp 14 10/27/20 1201  SpO2 92 % 10/27/20 1201  Vitals shown include unvalidated device data.  Last Pain:  Vitals:   10/27/20 0833  TempSrc: Oral  PainSc: 0-No pain         Complications: No notable events documented.

## 2020-10-27 NOTE — Consult Note (Signed)
WOC consulted for NPWT dressing changes, placed today in OR. Will plan to change tomorrow 10/14 to get on M/W/F scheduled.  With location would prefer to not wait until Monday for first post op change.   Juandaniel Manfredo [facility], CNS, [employer] [phone]

## 2020-10-27 NOTE — Interval H&P Note (Signed)
History and Physical Interval Note:  10/27/2020 10:07 AM  Mitchell [name]  has presented today for surgery, with the diagnosis of Osteomyelitis Right foot.  The various methods of treatment have been discussed with the patient and family. After consideration of risks, benefits and other options for treatment, the patient has consented to  Procedure(s): TRANSMETATARSAL AMPUTATION (Right) as a surgical intervention.  The patient's history has been reviewed, patient examined, no change in status, stable for surgery.  I have reviewed the patient's chart and labs.  Questions were answered to the patient's satisfaction.     Durene Cal

## 2020-10-27 NOTE — Anesthesia Procedure Notes (Signed)
Anesthesia Regional Block: Popliteal block   Pre-Anesthetic Checklist: , timeout performed,  Correct Patient, Correct Site, Correct Laterality,  Correct Procedure, Correct Position, site marked,  Risks and benefits discussed,  Surgical consent,  Pre-op evaluation,  At surgeon's request and post-op pain management  Laterality: Right  Prep: chloraprep       Needles:  Injection technique: Single-shot  Needle Type: Echogenic Stimulator Needle     Needle Length: 9cm  Needle Gauge: 21     Additional Needles:   Procedures:,,,, ultrasound used (permanent image in chart),,    Narrative:  Start time: 10/27/2020 10:00 AM End time: 10/27/2020 10:05 AM Injection made incrementally with aspirations every 5 mL.  Performed by: Personally  Anesthesiologist: Shelton Silvas, MD  Additional Notes: Patient tolerated the procedure well. Local anesthetic introduced in an incremental fashion under minimal resistance after negative aspirations. No paresthesias were elicited. After completion of the procedure, no acute issues were identified and patient continued to be monitored by RN.

## 2020-10-27 NOTE — Anesthesia Procedure Notes (Addendum)
Anesthesia Regional Block: Adductor canal block   Pre-Anesthetic Checklist: , timeout performed,  Correct Patient, Correct Site, Correct Laterality,  Correct Procedure, Correct Position, site marked,  Risks and benefits discussed,  Surgical consent,  Pre-op evaluation,  At surgeon's request and post-op pain management  Laterality: Right  Prep: chloraprep       Needles:  Injection technique: Single-shot  Needle Type: Echogenic Stimulator Needle     Needle Length: 9cm  Needle Gauge: 21     Additional Needles:   Procedures:,,,, ultrasound used (permanent image in chart),,    Narrative:  Start time: 10/27/2020 10:05 AM End time: 10/27/2020 10:10 AM Injection made incrementally with aspirations every 5 mL.  Performed by: Personally  Anesthesiologist: Shelton Silvas, MD  Additional Notes: Patient tolerated the procedure well. Local anesthetic introduced in an incremental fashion under minimal resistance after negative aspirations. No paresthesias were elicited. After completion of the procedure, no acute issues were identified and patient continued to be monitored by RN.

## 2020-10-27 NOTE — Anesthesia Preprocedure Evaluation (Addendum)
Anesthesia Evaluation  Patient identified by MRN, date of birth, ID band Patient awake    Reviewed: Allergy & Precautions, NPO status , Patient's Chart, lab work & pertinent test results  Airway Mallampati: I  TM Distance: >3 FB Neck ROM: Full    Dental  (+) Edentulous Upper, Edentulous Lower   Pulmonary neg pulmonary ROS, Current Smoker,    Pulmonary exam normal        Cardiovascular hypertension, + Peripheral Vascular Disease   Rhythm:Regular Rate:Normal     Neuro/Psych negative neurological ROS  negative psych ROS   GI/Hepatic negative GI ROS, Neg liver ROS,   Endo/Other  diabetes, Type 2, Insulin Dependent, Oral Hypoglycemic Agents  Renal/GU negative Renal ROS  negative genitourinary   Musculoskeletal  (+) Arthritis , Osteoarthritis,  Osteomyelitis right foot   Abdominal (+) + obese,   Peds  Hematology  (+) anemia ,   Anesthesia Other Findings   Reproductive/Obstetrics                          Anesthesia Physical Anesthesia Plan  ASA: 3  Anesthesia Plan: General   Post-op Pain Management: GA combined w/ Regional for post-op pain   Induction: Intravenous  PONV Risk Score and Plan: 1 and Ondansetron and Treatment may vary due to age or medical condition  Airway Management Planned: LMA  Additional Equipment: None  Intra-op Plan:   Post-operative Plan: Extubation in OR  Informed Consent: I have reviewed the patients History and Physical, chart, labs and discussed the procedure including the risks, benefits and alternatives for the proposed anesthesia with the patient or authorized representative who has indicated his/her understanding and acceptance.     Dental advisory given  Plan Discussed with:   Anesthesia Plan Comments: (Lab Results      Component                Value               Date                      WBC                      10.3                10/27/2020                 HGB                      9.8 (L)             10/27/2020                HCT                      36.1 (L)            10/27/2020                MCV                      69.4 (L)            10/27/2020                PLT                      492 (H)  10/27/2020           Lab Results      Component                Value               Date                      NA                       135                 10/27/2020                K                        4.7                 10/27/2020                CO2                      26                  10/27/2020                GLUCOSE                  163 (H)             10/27/2020                BUN                      16                  10/27/2020                CREATININE               0.82                10/27/2020                CALCIUM                  8.3 (L)             10/27/2020                GFRNONAA                 >60                 10/27/2020          )      Anesthesia Quick Evaluation

## 2020-10-27 NOTE — Consult Note (Signed)
Consultation  Referring Provider: Dr. Lanae Boast     Primary Care Physician:  Kirstie Peri, MD Primary Gastroenterologist:  Gentry Fitz       Reason for Consultation: Anemia, Heme +stool             HPI:   Mitchell [name] is a 70 y.o. male with a past medical history as listed below including diabetes, who presented to the emergency department at [facility] on 10/25/2020 with a 3 to 4-day history of right foot drainage/right foot swelling.  We are consulted in regards to anemia and heme positive stool.    Apparently at time of admission patient also described intermittently seeing some "dark stools", for 2 to 3 days and return back to brown, apparently been happening for 2 to 3 weeks.  Denied seeing any bright red blood.  Is on aspirin at home.  Described a history of 1 colonoscopy.    Today, patient, family by his bedside.  He has just returned from having his foot surgery earlier this morning and is eagerly eating lunch.  He tells me he was unaware that he was anemic but is aware that he got 4 units of blood since he has been in the hospital.  Tells me he had a colonoscopy "years ago", but he cannot remember where or what the results were.  He tells me he is not interested in having another one.  Does explain that he has been seeing a black stool off and on over the past couple of weeks which may come on for a day or 2 and then go back to normal.  Tells me he had a brown stool yesterday morning.  Denies any abdominal pain, heartburn or reflux.  Denies previous history of anemia.  Does use an 81 mg aspirin every day but denies other blood thinner use.  Denies NSAID use.    Denies fever, chills, weight loss or symptoms that awaken him from sleep.  ER course: Temp 97.7, heart rate 83, blood pressure 159/68, labs with white count of 7.5, hemoglobin 6.1, Hemoccult positive, iron studies and iron of 9, TIBC 410, percent saturation 2, ferritin of 5--> 1 unit PRBCs ordered  Past Medical History:  Diagnosis  Date   Arthritis    Cellulitis of right lower extremity 05/09/2014   Diabetes mellitus without complication (HCC)    Diabetic foot ulcer (HCC) 07/07/2014   Status post bedside debridement of multiple right plantar ulcers by podiatrist, Dr. Reynolds Bowl.   Hypercholesterolemia    Maggot infestation    right foot ulcer   Obesity 05/09/2014   Tobacco abuse 07/07/2014    Past Surgical History:  Procedure Laterality Date   APPENDECTOMY     PERIPHERAL VASCULAR CATHETERIZATION N/A 07/22/2015   Procedure: Abdominal Aortogram w/Lower Extremity;  Surgeon: Sherren Kerns, MD;  Location: [facility] INVASIVE CV LAB;  Service: Cardiovascular;  Laterality: N/A;    Family History: No GI cancers  Social History   Tobacco Use   Smoking status: Every Day    Packs/day: 1.50    Types: Cigarettes   Smokeless tobacco: Never  Substance Use Topics   Alcohol use: Yes    Alcohol/week: 0.0 standard drinks    Comment: occ-twice a week beer approx 2   Drug use: No    Prior to Admission medications   Medication Sig Start Date End Date Taking? Authorizing Provider  aspirin EC 81 MG tablet Take 81 mg by mouth daily.   Yes [provider]  clotrimazole-betamethasone (LOTRISONE) cream Apply topically. 07/21/20  Yes [provider]  furosemide (LASIX) 20 MG tablet Take 1 tablet (20 mg total) by mouth daily. 03/22/13  Yes Standley Brooking, MD  gabapentin (NEURONTIN) 300 MG capsule Take 1 capsule (300 mg total) by mouth 3 (three) times daily. 03/22/13  Yes Standley Brooking, MD  JARDIANCE 25 MG TABS tablet Take 25 mg by mouth daily.  05/12/18  Yes [provider]  lovastatin (MEVACOR) 20 MG tablet Take 1 tablet (20 mg total) by mouth at bedtime. Patient taking differently: Take 20 mg by mouth daily. 03/22/13  Yes Standley Brooking, MD  NOVOLOG FLEXPEN 100 UNIT/ML FlexPen Inject 15-30 Units into the skin See admin instructions. 15 units with breakfast 30 units with lunch 30 units with dinner 12/15/18  Yes  [provider]  sulfamethoxazole-trimethoprim (BACTRIM DS) 800-160 MG tablet Take 1 tablet by mouth 2 (two) times daily. 10/11/20  Yes [provider]  TRESIBA FLEXTOUCH 200 UNIT/ML SOPN Inject 55 Units into the skin daily. 06/24/15  Yes [provider]  doxycycline (VIBRA-TABS) 100 MG tablet Take 1 tablet (100 mg total) by mouth 2 (two) times daily. Patient not taking: Reported on 10/25/2020 12/15/18   Candelaria Stagers, DPM  HYDROcodone-acetaminophen (NORCO/VICODIN) 5-325 MG tablet Take 1-2 tablets by mouth every 6 (six) hours as needed for moderate pain. Patient not taking: No sig reported 02/18/19   Kathryne Hitch, MD  ibuprofen (ADVIL) 800 MG tablet Take 1 tablet (800 mg total) by mouth every 6 (six) hours as needed. Patient not taking: Reported on 10/25/2020 12/15/18   Candelaria Stagers, DPM  metFORMIN (GLUCOPHAGE) 500 MG tablet Take 1,000 mg by mouth 2 (two) times daily. Patient not taking: Reported on 10/25/2020 05/26/14   [provider]    Current Facility-Administered Medications  Medication Dose Route Frequency Provider Last Rate Last Admin   [MAR Hold] acetaminophen (TYLENOL) tablet 650 mg  650 mg Oral Q6H PRN Carollee Herter, DO       Or   [MAR Hold] acetaminophen (TYLENOL) suppository 650 mg  650 mg Rectal Q6H PRN Carollee Herter, DO       [facility] Hold] cefTRIAXone (ROCEPHIN) 2 g in sodium chloride 0.9 % 100 mL IVPB  2 g Intravenous Q24H Carollee Herter, DO 200 mL/hr at 10/26/20 2150 2 g at 10/26/20 2150   [MAR Hold] empagliflozin (JARDIANCE) tablet 25 mg  25 mg Oral Daily Carollee Herter, DO   25 mg at 10/26/20 0959   [MAR Hold] gabapentin (NEURONTIN) capsule 300 mg  300 mg Oral TID Carollee Herter, DO   300 mg at 10/26/20 2112   [facility] Hold] insulin aspart (novoLOG) injection 0-5 Units  0-5 Units Subcutaneous QHS Carollee Herter, DO       [facility] Hold] insulin aspart (novoLOG) injection 0-9 Units  0-9 Units Subcutaneous TID WC Carollee Herter, DO   1 Units at 10/27/20 0756   [MAR Hold]  insulin aspart (novoLOG) injection 30 Units  30 Units Subcutaneous BID AC Carollee Herter, DO   30 Units at 10/26/20 1657   insulin aspart (novoLOG) injection 5 Units  5 Units Subcutaneous TID WC Kc, Ramesh, MD       insulin glargine-yfgn (SEMGLEE) injection 15 Units  15 Units Subcutaneous Daily Kc, Ramesh, MD       lactated ringers infusion   Intravenous Continuous Stoltzfus, Gregory P, DO 10 mL/hr at 10/27/20 1016 Continued from Pre-op at 10/27/20 1016   [MAR Hold] melatonin tablet 6  mg  6 mg Oral QHS PRN Hall, Scott A, RPH       [MAR Hold] metroNIDAZOLE (FLAGYL) tablet 500 mg  500 mg Oral Q12H Chen, Eric, DO   500 mg at 10/26/20 2112   [MAR Hold] nicotine (NICODERM CQ - dosed in mg/24 hours) patch 21 mg  21 mg Transdermal Daily Chen, Eric, DO   21 mg at 10/26/20 1003   [MAR Hold] ondansetron (ZOFRAN) tablet 4 mg  4 mg Oral Q6H PRN Chen, Eric, DO       Or   [MAR Hold] ondansetron (ZOFRAN) injection 4 mg  4 mg Intravenous Q6H PRN Chen, Eric, DO       pantoprozole (PROTONIX) 80 mg /NS 100 mL infusion  8 mg/hr Intravenous Continuous Chen, Eric, DO 10 mL/hr at 10/27/20 0502 8 mg/hr at 10/27/20 0502   [MAR Hold] pravastatin (PRAVACHOL) tablet 20 mg  20 mg Oral q1800 Chen, Eric, DO   20 mg at 10/26/20 1657   [MAR Hold] vancomycin (VANCOREADY) IVPB 1500 mg/300 mL  1,500 mg Intravenous Q12H Chen, Eric, DO 150 mL/hr at 10/27/20 0432 1,500 mg at 10/27/20 0432   Facility-Administered Medications Ordered in Other Encounters  Medication Dose Route Frequency Provider Last Rate Last Admin   fentaNYL (SUBLIMAZE) injection   Intravenous Anesthesia Intra-op Lunsford, Blaire Millen, CRNA   100 mcg at 10/27/20 0953   midazolam (VERSED) 5 MG/5ML injection   Intravenous Anesthesia Intra-op Espejo, Blaire Millen, CRNA   2 mg at 10/27/20 0953    Allergies as of 10/25/2020 - Review Complete 10/25/2020  Allergen Reaction Noted   Penicillins Itching and Rash 03/18/2012     Review of Systems:    Constitutional: No weight  loss, fever or chills Skin: No rash  Cardiovascular: No chest pain Respiratory: No SOB  Gastrointestinal: See HPI and otherwise negative Genitourinary: No dysuria Neurological: No headache, dizziness or syncope Musculoskeletal: No new muscle or joint pain Hematologic: No  bruising Psychiatric: No history of depression or anxiety    Physical Exam:  Vital signs in last 24 hours: Temp:  [97.8 F (36.6 C)-98.4 F (36.9 C)] 98 F (36.7 C) (10/13 0833) Pulse Rate:  [62-85] 69 (10/13 1012) Resp:  [14-25] 22 (10/13 1012) BP: (121-155)/(49-74) 122/74 (10/13 1012) SpO2:  [92 %-100 %] 97 % (10/13 1012) Weight:  [112.9 kg] 112.9 kg (10/13 0838) Last BM Date: 10/25/20 General:   Pleasant Caucasian male appears to be in NAD, Well developed, Well nourished, alert and cooperative Head:  Normocephalic and atraumatic. Eyes:   PEERL, EOMI. No icterus. Conjunctiva pink. Ears:  Normal auditory acuity. Neck:  Supple Throat: Oral cavity and pharynx without inflammation, swelling or lesion.  Lungs: Respirations even and unlabored. Lungs clear to auscultation bilaterally.   No wheezes, crackles, or rhonchi.  Heart: Normal S1, S2. No MRG. Regular rate and rhythm. No peripheral edema, cyanosis or pallor.  Abdomen:  Soft, nondistended, nontender. No rebound or guarding. Normal bowel sounds. No appreciable masses or hepatomegaly. Rectal:  Not performed.  Msk:  Symmetrical without gross deformities. Peripheral pulses intact.  Extremities:  Without edema, no deformity or joint abnormality. +recent right foot surgery-in clean bandaging Neurologic:  Alert and  oriented x4;  grossly normal neurologically.  Skin:   Dry and intact without significant lesions or rashes. Psychiatric: Demonstrates good judgement and reason without abnormal affect or behaviors.   LAB RESULTS: Recent Labs    10/25/20 1445 10/25/20 1550 10/26/20 0347 10/26/20 1635 10/27/20 0238  WBC 7.5  --   --   --    10.3  HGB 6.1*   < >  7.7* 9.7* 9.8*  HCT 25.6*   < > 31.0* 36.3* 36.1*  PLT 572*  --   --   --  492*   < > = values in this interval not displayed.   BMET Recent Labs    10/25/20 1445 10/27/20 0238  NA 136 135  K 5.2* 4.7  CL 99 101  CO2 30 26  GLUCOSE 134* 163*  BUN 14 16  CREATININE 0.84 0.82  CALCIUM 8.5* 8.3*   LFT Recent Labs    10/25/20 1445  PROT 7.5  ALBUMIN 2.8*  AST 13*  ALT 9  ALKPHOS 115  BILITOT 0.7   PT/INR Recent Labs    10/25/20 1550  LABPROT 15.2  INR 1.2    STUDIES: MR FOOT RIGHT W WO CONTRAST  Result Date: 10/26/2020 CLINICAL DATA:  Nonhealing wounds. Diabetic foot ulcer. Concern for osteomyelitis. EXAM: MRI OF THE RIGHT FOREFOOT WITHOUT AND WITH CONTRAST TECHNIQUE: Multiplanar, multisequence MR imaging of the right forefoot was performed before and after the administration of intravenous contrast. CONTRAST:  6mL GADAVIST GADOBUTROL 1 MMOL/ML IV SOLN COMPARISON:  X-ray 10/25/2020 FINDINGS: Bones/Joint/Cartilage Postsurgical changes of third toe amputation at the third MTP joint level. The fourth MTP joint is dorsally dislocated. Marked bone marrow edema and enhancement with confluent low T1 signal change throughout the proximal phalanx of the fourth toe and throughout the fourth metatarsal extending from the level of the metatarsal head to the proximal metaphysis, compatible with acute osteomyelitis. Complex fourth MTP joint effusion compatible with septic arthritis. Preservation of the marrow signal within the middle and distal phalanx of fourth toe. Fifth toe hammertoe deformity. There is bone marrow edema within the fifth toe and fifth metatarsal head without confluent low T1 signal changes. Acute or subacute fracture involving the second toe proximal phalanx without significant displacement (series 11, image 20). Degenerative changes of the great toe IP joint. Marrow signal within the first ray is maintained. No bony abnormality is seen within the midfoot. Ligaments  Intact Lisfranc ligament. Muscles and Tendons Findings of denervation and/or myositis throughout the forefoot. No significant tenosynovial fluid collection. Soft tissues Soft tissue ulceration underlying the level of the fourth MTP joint with rim enhancing air and fluid collection surrounding the fourth metatarsal head and fourth MTP joint measuring 3.1 x 1.5 x 1.9 cm (series 13, image 18). Additional adjacent abscess overlies the dorsal aspect of the fourth MTP joint measuring 2.1 x 0.9 x 2.0 cm (series 14, image 12). Extensive circumferential subcutaneous edema. IMPRESSION: 1. Acute osteomyelitis of the fourth metatarsal and proximal phalanx of the fourth toe. 2. Plantar soft tissue ulceration underlying the level of the fourth MTP joint with 3.1 cm abscess surrounding the fourth metatarsal head and fourth MTP joint. Additional 2.1 cm abscess overlies the dorsal aspect of the fourth MTP joint. 3. Bone marrow edema within the fifth toe and fifth metatarsal head without confluent low T1 signal changes. Findings may reflect reactive osteitis. Early acute osteomyelitis at this site is difficult to exclude 4. Acute or subacute fracture involving the second toe proximal phalanx. 5. Extensive circumferential subcutaneous edema, likely cellulitis. 6. Findings of muscle denervation and/or myositis throughout the forefoot. Electronically Signed   By: Duanne Guess D.O.   On: 10/26/2020 08:18   US ARTERIAL ABI (SCREENING LOWER EXTREMITY)  Result Date: 10/26/2020 CLINICAL DATA:  Peripheral vascular disease EXAM: NONINVASIVE PHYSIOLOGIC VASCULAR STUDY OF BILATERAL LOWER EXTREMITIES TECHNIQUE: Evaluation of both lower extremities  were performed at rest, including calculation of ankle-brachial indices with single level Doppler, pressure and pulse volume recording. COMPARISON:  None. FINDINGS: Right ABI:  1.1 Left ABI:  1.1 Right Lower Extremity:  Abnormal monophasic arterial waveforms. Left Lower Extremity: Abnormal  monophasic posterior tibial arterial waveform. Dorsalis pedis waveform is biphasic. 1.0-1.4 Normal IMPRESSION: Technically, the resting bilateral ankle-brachial indices are within normal limits. However, the waveforms are abnormal in suggest upstream obstructive disease. The ABIs may be falsely elevated secondary to poor compressibility of the vessels in the setting of chronic medial calcinosis. If clinical concern for significant peripheral arterial disease persists, consider multilevel segmental noninvasive evaluation or CT arteriogram with runoff. Signed, Sterling Big, MD, RPVI [facility] Radiology Electronically Signed   By: Malachy Moan M.D.   On: 10/26/2020 09:15   DG Foot Complete Right  Result Date: 10/25/2020 CLINICAL DATA:  Right foot infection. EXAM: RIGHT FOOT COMPLETE - 3+ VIEW COMPARISON:  January 14, 2019. FINDINGS: Status post amputation of third toe. Defect is seen involving distal portions of second, third and fifth metatarsals consistent with old osteomyelitis. There is possible lytic destruction involving the distal fourth metatarsal concerning for osteomyelitis. Gas is seen in the adjacent soft tissues suggesting cellulitis. Severe swelling of the second and fourth toes is noted IMPRESSION: Chronic changes as described above. Possible acute lytic destruction involving distal fourth metatarsal concerning for osteomyelitis. Gas is seen in the adjacent soft tissues suggesting cellulitis. Electronically Signed   By: Lupita Raider M.D.   On: 10/25/2020 14:30     Impression / Plan:   Impression: 1.  Iron deficiency anemia: With a hemoglobin of 6.1--> 2 unit PRBCs--> 7.7--> 2 units PRBCs--> 9.7--> 9.8, Hemoccult positive, history of "dark stools"; consider upper GI bleed versus lower 2.  Osteomyelitis of the right foot: Patient underwent transmetatarsal amputation of the right foot today. 3.  Type 2 diabetes  Plan: 1.   Given patient's recent foot surgery, do not believe he would be able to prep for a colonoscopy as it would be very difficult for him to get up and out of bed and to the bathroom multiple times.  We will plan on proceeding with EGD tomorrow 10/28/2020 for further evaluation of his iron deficiency anemia, especially given history of "dark stools".  Did discuss risk on benefits, limitations and alternatives and patient agrees to proceed. 2.  Patient can be on regular diet today and will be n.p.o. at midnight. 3.  Not sure that pantoprazole infusion is necessary, but can continue for now and this can be changed after time of EGD tomorrow. 4.  Continue to monitor hemoglobin with transfusion as needed less than 7.  Thank you for your kind consultation, we will continue to follow.  Violet Baldy Zarina Pe  10/27/2020, 10:29 AM

## 2020-10-27 NOTE — Anesthesia Procedure Notes (Signed)
Procedure Name: LMA Insertion Date/Time: 10/27/2020 10:32 AM Performed by: Garfield Cornea, CRNA Pre-anesthesia Checklist: Patient identified, Emergency Drugs available, Suction available and Patient being monitored Patient Re-evaluated:Patient Re-evaluated prior to induction Oxygen Delivery Method: Circle System Utilized Preoxygenation: Pre-oxygenation with 100% oxygen Induction Type: IV induction LMA: LMA inserted LMA Size: 4.0 Number of attempts: 1 Placement Confirmation: positive ETCO2 Tube secured with: Tape Dental Injury: Teeth and Oropharynx as per pre-operative assessment

## 2020-10-27 NOTE — H&P (View-Only) (Signed)
Consultation  Referring Provider: Dr. Lanae Boast     Primary Care Physician:  Kirstie Peri, MD Primary Gastroenterologist:  Gentry Fitz       Reason for Consultation: Anemia, Heme +stool             HPI:   Mitchell [name] is a 70 y.o. male with a past medical history as listed below including diabetes, who presented to the emergency department at [facility] on 10/25/2020 with a 3 to 4-day history of right foot drainage/right foot swelling.  We are consulted in regards to anemia and heme positive stool.    Apparently at time of admission patient also described intermittently seeing some "dark stools", for 2 to 3 days and return back to brown, apparently been happening for 2 to 3 weeks.  Denied seeing any bright red blood.  Is on aspirin at home.  Described a history of 1 colonoscopy.    Today, patient, family by his bedside.  He has just returned from having his foot surgery earlier this morning and is eagerly eating lunch.  He tells me he was unaware that he was anemic but is aware that he got 4 units of blood since he has been in the hospital.  Tells me he had a colonoscopy "years ago", but he cannot remember where or what the results were.  He tells me he is not interested in having another one.  Does explain that he has been seeing a black stool off and on over the past couple of weeks which may come on for a day or 2 and then go back to normal.  Tells me he had a brown stool yesterday morning.  Denies any abdominal pain, heartburn or reflux.  Denies previous history of anemia.  Does use an 81 mg aspirin every day but denies other blood thinner use.  Denies NSAID use.    Denies fever, chills, weight loss or symptoms that awaken him from sleep.  ER course: Temp 97.7, heart rate 83, blood pressure 159/68, labs with white count of 7.5, hemoglobin 6.1, Hemoccult positive, iron studies and iron of 9, TIBC 410, percent saturation 2, ferritin of 5--> 1 unit PRBCs ordered  Past Medical History:  Diagnosis  Date   Arthritis    Cellulitis of right lower extremity 05/09/2014   Diabetes mellitus without complication (HCC)    Diabetic foot ulcer (HCC) 07/07/2014   Status post bedside debridement of multiple right plantar ulcers by podiatrist, Dr. Reynolds Bowl.   Hypercholesterolemia    Maggot infestation    right foot ulcer   Obesity 05/09/2014   Tobacco abuse 07/07/2014    Past Surgical History:  Procedure Laterality Date   APPENDECTOMY     PERIPHERAL VASCULAR CATHETERIZATION N/A 07/22/2015   Procedure: Abdominal Aortogram w/Lower Extremity;  Surgeon: Sherren Kerns, MD;  Location: [facility] INVASIVE CV LAB;  Service: Cardiovascular;  Laterality: N/A;    Family History: No GI cancers  Social History   Tobacco Use   Smoking status: Every Day    Packs/day: 1.50    Types: Cigarettes   Smokeless tobacco: Never  Substance Use Topics   Alcohol use: Yes    Alcohol/week: 0.0 standard drinks    Comment: occ-twice a week beer approx 2   Drug use: No    Prior to Admission medications   Medication Sig Start Date End Date Taking? Authorizing Provider  aspirin EC 81 MG tablet Take 81 mg by mouth daily.   Yes [provider]  clotrimazole-betamethasone (LOTRISONE) cream Apply topically. 07/21/20  Yes [provider]  furosemide (LASIX) 20 MG tablet Take 1 tablet (20 mg total) by mouth daily. 03/22/13  Yes Standley Brooking, MD  gabapentin (NEURONTIN) 300 MG capsule Take 1 capsule (300 mg total) by mouth 3 (three) times daily. 03/22/13  Yes Standley Brooking, MD  JARDIANCE 25 MG TABS tablet Take 25 mg by mouth daily.  05/12/18  Yes [provider]  lovastatin (MEVACOR) 20 MG tablet Take 1 tablet (20 mg total) by mouth at bedtime. Patient taking differently: Take 20 mg by mouth daily. 03/22/13  Yes Standley Brooking, MD  NOVOLOG FLEXPEN 100 UNIT/ML FlexPen Inject 15-30 Units into the skin See admin instructions. 15 units with breakfast 30 units with lunch 30 units with dinner 12/15/18  Yes  [provider]  sulfamethoxazole-trimethoprim (BACTRIM DS) 800-160 MG tablet Take 1 tablet by mouth 2 (two) times daily. 10/11/20  Yes [provider]  TRESIBA FLEXTOUCH 200 UNIT/ML SOPN Inject 55 Units into the skin daily. 06/24/15  Yes [provider]  doxycycline (VIBRA-TABS) 100 MG tablet Take 1 tablet (100 mg total) by mouth 2 (two) times daily. Patient not taking: Reported on 10/25/2020 12/15/18   Candelaria Stagers, DPM  HYDROcodone-acetaminophen (NORCO/VICODIN) 5-325 MG tablet Take 1-2 tablets by mouth every 6 (six) hours as needed for moderate pain. Patient not taking: No sig reported 02/18/19   Kathryne Hitch, MD  ibuprofen (ADVIL) 800 MG tablet Take 1 tablet (800 mg total) by mouth every 6 (six) hours as needed. Patient not taking: Reported on 10/25/2020 12/15/18   Candelaria Stagers, DPM  metFORMIN (GLUCOPHAGE) 500 MG tablet Take 1,000 mg by mouth 2 (two) times daily. Patient not taking: Reported on 10/25/2020 05/26/14   [provider]    Current Facility-Administered Medications  Medication Dose Route Frequency Provider Last Rate Last Admin   [MAR Hold] acetaminophen (TYLENOL) tablet 650 mg  650 mg Oral Q6H PRN Carollee Herter, DO       Or   [MAR Hold] acetaminophen (TYLENOL) suppository 650 mg  650 mg Rectal Q6H PRN Carollee Herter, DO       [facility] Hold] cefTRIAXone (ROCEPHIN) 2 g in sodium chloride 0.9 % 100 mL IVPB  2 g Intravenous Q24H Carollee Herter, DO 200 mL/hr at 10/26/20 2150 2 g at 10/26/20 2150   [MAR Hold] empagliflozin (JARDIANCE) tablet 25 mg  25 mg Oral Daily Carollee Herter, DO   25 mg at 10/26/20 0959   [MAR Hold] gabapentin (NEURONTIN) capsule 300 mg  300 mg Oral TID Carollee Herter, DO   300 mg at 10/26/20 2112   [facility] Hold] insulin aspart (novoLOG) injection 0-5 Units  0-5 Units Subcutaneous QHS Carollee Herter, DO       [facility] Hold] insulin aspart (novoLOG) injection 0-9 Units  0-9 Units Subcutaneous TID WC Carollee Herter, DO   1 Units at 10/27/20 0756   [MAR Hold]  insulin aspart (novoLOG) injection 30 Units  30 Units Subcutaneous BID AC Carollee Herter, DO   30 Units at 10/26/20 1657   insulin aspart (novoLOG) injection 5 Units  5 Units Subcutaneous TID WC Kc, Ramesh, MD       insulin glargine-yfgn (SEMGLEE) injection 15 Units  15 Units Subcutaneous Daily Kc, Ramesh, MD       lactated ringers infusion   Intravenous Continuous Stoltzfus, Gregory P, DO 10 mL/hr at 10/27/20 1016 Continued from Pre-op at 10/27/20 1016   [MAR Hold] melatonin tablet 6  mg  6 mg Oral QHS PRN Wayland Denis, RPH       [MAR Hold] metroNIDAZOLE (FLAGYL) tablet 500 mg  500 mg Oral Q12H Carollee Herter, DO   500 mg at 10/26/20 2112   [facility] Hold] nicotine (NICODERM CQ - dosed in mg/24 hours) patch 21 mg  21 mg Transdermal Daily Carollee Herter, DO   21 mg at 10/26/20 1003   [MAR Hold] ondansetron (ZOFRAN) tablet 4 mg  4 mg Oral Q6H PRN Carollee Herter, DO       Or   [MAR Hold] ondansetron [facility]) injection 4 mg  4 mg Intravenous Q6H PRN Carollee Herter, DO       pantoprozole (PROTONIX) 80 mg /NS 100 mL infusion  8 mg/hr Intravenous Continuous Carollee Herter, DO 10 mL/hr at 10/27/20 0502 8 mg/hr at 10/27/20 0502   [MAR Hold] pravastatin (PRAVACHOL) tablet 20 mg  20 mg Oral q1800 Carollee Herter, DO   20 mg at 10/26/20 1657   [MAR Hold] vancomycin (VANCOREADY) IVPB 1500 mg/300 mL  1,500 mg Intravenous Q12H Carollee Herter, DO 150 mL/hr at 10/27/20 0432 1,500 mg at 10/27/20 0432   Facility-Administered Medications Ordered in Other Encounters  Medication Dose Route Frequency Provider Last Rate Last Admin   fentaNYL (SUBLIMAZE) injection   Intravenous Anesthesia Intra-op Garfield Cornea, CRNA   100 mcg at 10/27/20 6546   midazolam (VERSED) 5 MG/5ML injection   Intravenous Anesthesia Intra-op Garfield Cornea, CRNA   2 mg at 10/27/20 5035    Allergies as of 10/25/2020 - Review Complete 10/25/2020  Allergen Reaction Noted   Penicillins Itching and Rash 03/18/2012     Review of Systems:    Constitutional: No weight  loss, fever or chills Skin: No rash  Cardiovascular: No chest pain Respiratory: No SOB  Gastrointestinal: See HPI and otherwise negative Genitourinary: No dysuria Neurological: No headache, dizziness or syncope Musculoskeletal: No new muscle or joint pain Hematologic: No  bruising Psychiatric: No history of depression or anxiety    Physical Exam:  Vital signs in last 24 hours: Temp:  [97.8 F (36.6 C)-98.4 F (36.9 C)] 98 F (36.7 C) (10/13 0833) Pulse Rate:  [62-85] 69 (10/13 1012) Resp:  [14-25] 22 (10/13 1012) BP: (121-155)/(49-74) 122/74 (10/13 1012) SpO2:  [92 %-100 %] 97 % (10/13 1012) Weight:  [112.9 kg] 112.9 kg (10/13 0838) Last BM Date: 10/25/20 General:   Pleasant Caucasian male appears to be in NAD, Well developed, Well nourished, alert and cooperative Head:  Normocephalic and atraumatic. Eyes:   PEERL, EOMI. No icterus. Conjunctiva pink. Ears:  Normal auditory acuity. Neck:  Supple Throat: Oral cavity and pharynx without inflammation, swelling or lesion.  Lungs: Respirations even and unlabored. Lungs clear to auscultation bilaterally.   No wheezes, crackles, or rhonchi.  Heart: Normal S1, S2. No MRG. Regular rate and rhythm. No peripheral edema, cyanosis or pallor.  Abdomen:  Soft, nondistended, nontender. No rebound or guarding. Normal bowel sounds. No appreciable masses or hepatomegaly. Rectal:  Not performed.  Msk:  Symmetrical without gross deformities. Peripheral pulses intact.  Extremities:  Without edema, no deformity or joint abnormality. +recent right foot surgery-in clean bandaging Neurologic:  Alert and  oriented x4;  grossly normal neurologically.  Skin:   Dry and intact without significant lesions or rashes. Psychiatric: Demonstrates good judgement and reason without abnormal affect or behaviors.   LAB RESULTS: Recent Labs    10/25/20 1445 10/25/20 1550 10/26/20 0347 10/26/20 1635 10/27/20 0238  WBC 7.5  --   --   --  10.3  HGB 6.1*   < >  7.7* 9.7* 9.8*  HCT 25.6*   < > 31.0* 36.3* 36.1*  PLT 572*  --   --   --  492*   < > = values in this interval not displayed.   BMET Recent Labs    10/25/20 1445 10/27/20 0238  NA 136 135  K 5.2* 4.7  CL 99 101  CO2 30 26  GLUCOSE 134* 163*  BUN 14 16  CREATININE 0.84 0.82  CALCIUM 8.5* 8.3*   LFT Recent Labs    10/25/20 1445  PROT 7.5  ALBUMIN 2.8*  AST 13*  ALT 9  ALKPHOS 115  BILITOT 0.7   PT/INR Recent Labs    10/25/20 1550  LABPROT 15.2  INR 1.2    STUDIES: MR FOOT RIGHT W WO CONTRAST  Result Date: 10/26/2020 CLINICAL DATA:  Nonhealing wounds. Diabetic foot ulcer. Concern for osteomyelitis. EXAM: MRI OF THE RIGHT FOREFOOT WITHOUT AND WITH CONTRAST TECHNIQUE: Multiplanar, multisequence MR imaging of the right forefoot was performed before and after the administration of intravenous contrast. CONTRAST:  6mL GADAVIST GADOBUTROL 1 MMOL/ML IV SOLN COMPARISON:  X-ray 10/25/2020 FINDINGS: Bones/Joint/Cartilage Postsurgical changes of third toe amputation at the third MTP joint level. The fourth MTP joint is dorsally dislocated. Marked bone marrow edema and enhancement with confluent low T1 signal change throughout the proximal phalanx of the fourth toe and throughout the fourth metatarsal extending from the level of the metatarsal head to the proximal metaphysis, compatible with acute osteomyelitis. Complex fourth MTP joint effusion compatible with septic arthritis. Preservation of the marrow signal within the middle and distal phalanx of fourth toe. Fifth toe hammertoe deformity. There is bone marrow edema within the fifth toe and fifth metatarsal head without confluent low T1 signal changes. Acute or subacute fracture involving the second toe proximal phalanx without significant displacement (series 11, image 20). Degenerative changes of the great toe IP joint. Marrow signal within the first ray is maintained. No bony abnormality is seen within the midfoot. Ligaments  Intact Lisfranc ligament. Muscles and Tendons Findings of denervation and/or myositis throughout the forefoot. No significant tenosynovial fluid collection. Soft tissues Soft tissue ulceration underlying the level of the fourth MTP joint with rim enhancing air and fluid collection surrounding the fourth metatarsal head and fourth MTP joint measuring 3.1 x 1.5 x 1.9 cm (series 13, image 18). Additional adjacent abscess overlies the dorsal aspect of the fourth MTP joint measuring 2.1 x 0.9 x 2.0 cm (series 14, image 12). Extensive circumferential subcutaneous edema. IMPRESSION: 1. Acute osteomyelitis of the fourth metatarsal and proximal phalanx of the fourth toe. 2. Plantar soft tissue ulceration underlying the level of the fourth MTP joint with 3.1 cm abscess surrounding the fourth metatarsal head and fourth MTP joint. Additional 2.1 cm abscess overlies the dorsal aspect of the fourth MTP joint. 3. Bone marrow edema within the fifth toe and fifth metatarsal head without confluent low T1 signal changes. Findings may reflect reactive osteitis. Early acute osteomyelitis at this site is difficult to exclude 4. Acute or subacute fracture involving the second toe proximal phalanx. 5. Extensive circumferential subcutaneous edema, likely cellulitis. 6. Findings of muscle denervation and/or myositis throughout the forefoot. Electronically Signed   By: Duanne Guess D.O.   On: 10/26/2020 08:18   US ARTERIAL ABI (SCREENING LOWER EXTREMITY)  Result Date: 10/26/2020 CLINICAL DATA:  Peripheral vascular disease EXAM: NONINVASIVE PHYSIOLOGIC VASCULAR STUDY OF BILATERAL LOWER EXTREMITIES TECHNIQUE: Evaluation of both lower extremities  were performed at rest, including calculation of ankle-brachial indices with single level Doppler, pressure and pulse volume recording. COMPARISON:  None. FINDINGS: Right ABI:  1.1 Left ABI:  1.1 Right Lower Extremity:  Abnormal monophasic arterial waveforms. Left Lower Extremity: Abnormal  monophasic posterior tibial arterial waveform. Dorsalis pedis waveform is biphasic. 1.0-1.4 Normal IMPRESSION: Technically, the resting bilateral ankle-brachial indices are within normal limits. However, the waveforms are abnormal in suggest upstream obstructive disease. The ABIs may be falsely elevated secondary to poor compressibility of the vessels in the setting of chronic medial calcinosis. If clinical concern for significant peripheral arterial disease persists, consider multilevel segmental noninvasive evaluation or CT arteriogram with runoff. Signed, Sterling Big, MD, RPVI [facility] Radiology Electronically Signed   By: Malachy Moan M.D.   On: 10/26/2020 09:15   DG Foot Complete Right  Result Date: 10/25/2020 CLINICAL DATA:  Right foot infection. EXAM: RIGHT FOOT COMPLETE - 3+ VIEW COMPARISON:  January 14, 2019. FINDINGS: Status post amputation of third toe. Defect is seen involving distal portions of second, third and fifth metatarsals consistent with old osteomyelitis. There is possible lytic destruction involving the distal fourth metatarsal concerning for osteomyelitis. Gas is seen in the adjacent soft tissues suggesting cellulitis. Severe swelling of the second and fourth toes is noted IMPRESSION: Chronic changes as described above. Possible acute lytic destruction involving distal fourth metatarsal concerning for osteomyelitis. Gas is seen in the adjacent soft tissues suggesting cellulitis. Electronically Signed   By: Lupita Raider M.D.   On: 10/25/2020 14:30     Impression / Plan:   Impression: 1.  Iron deficiency anemia: With a hemoglobin of 6.1--> 2 unit PRBCs--> 7.7--> 2 units PRBCs--> 9.7--> 9.8, Hemoccult positive, history of "dark stools"; consider upper GI bleed versus lower 2.  Osteomyelitis of the right foot: Patient underwent transmetatarsal amputation of the right foot today. 3.  Type 2 diabetes  Plan: 1.   Given patient's recent foot surgery, do not believe he would be able to prep for a colonoscopy as it would be very difficult for him to get up and out of bed and to the bathroom multiple times.  We will plan on proceeding with EGD tomorrow 10/28/2020 for further evaluation of his iron deficiency anemia, especially given history of "dark stools".  Did discuss risk on benefits, limitations and alternatives and patient agrees to proceed. 2.  Patient can be on regular diet today and will be n.p.o. at midnight. 3.  Not sure that pantoprazole infusion is necessary, but can continue for now and this can be changed after time of EGD tomorrow. 4.  Continue to monitor hemoglobin with transfusion as needed less than 7.  Thank you for your kind consultation, we will continue to follow.  Violet Baldy Bobetta Korf  10/27/2020, 10:29 AM

## 2020-10-28 ENCOUNTER — Encounter (HOSPITAL_COMMUNITY)
Admission: EM | Disposition: A | Discharge status: Home / Self Care | Payer: Self-pay | Source: Home / Self Care | Attending: Internal Medicine

## 2020-10-28 ENCOUNTER — Encounter (HOSPITAL_COMMUNITY): Payer: Self-pay | Admitting: Surgery

## 2020-10-28 ENCOUNTER — Inpatient Hospital Stay (HOSPITAL_COMMUNITY): Payer: Medicare HMO | Admitting: Anesthesiology

## 2020-10-28 ENCOUNTER — Telehealth: Payer: Self-pay

## 2020-10-28 DIAGNOSIS — M868X7 Other osteomyelitis, ankle and foot: Secondary | ICD-10-CM | POA: Diagnosis not present

## 2020-10-28 DIAGNOSIS — D500 Iron deficiency anemia secondary to blood loss (chronic): Secondary | ICD-10-CM | POA: Diagnosis not present

## 2020-10-28 DIAGNOSIS — K31819 Angiodysplasia of stomach and duodenum without bleeding: Secondary | ICD-10-CM | POA: Diagnosis not present

## 2020-10-28 HISTORY — PX: HEMOSTASIS CLIP PLACEMENT: SHX6857

## 2020-10-28 HISTORY — PX: HOT HEMOSTASIS: SHX5433

## 2020-10-28 HISTORY — PX: ESOPHAGOGASTRODUODENOSCOPY (EGD) WITH PROPOFOL: SHX5813

## 2020-10-28 LAB — CBC
HCT: 36.1 % — ABNORMAL LOW (ref 39.0–52.0)
Hemoglobin: 9.6 g/dL — ABNORMAL LOW (ref 13.0–17.0)
MCH: 18.7 pg — ABNORMAL LOW (ref 26.0–34.0)
MCHC: 26.6 g/dL — ABNORMAL LOW (ref 30.0–36.0)
MCV: 70.4 fL — ABNORMAL LOW (ref 80.0–100.0)
Platelets: 451 10*3/uL — ABNORMAL HIGH (ref 150–400)
RBC: 5.13 MIL/uL (ref 4.22–5.81)
RDW: 31.7 % — ABNORMAL HIGH (ref 11.5–15.5)
WBC: 10.1 10*3/uL (ref 4.0–10.5)
nRBC: 0 % (ref 0.0–0.2)

## 2020-10-28 LAB — GLUCOSE, CAPILLARY
Glucose-Capillary: 94 mg/dL (ref 70–99)
Glucose-Capillary: 102 mg/dL — ABNORMAL HIGH (ref 70–99)
Glucose-Capillary: 183 mg/dL — ABNORMAL HIGH (ref 70–99)
Glucose-Capillary: 141 mg/dL — ABNORMAL HIGH (ref 70–99)

## 2020-10-28 LAB — BASIC METABOLIC PANEL
Anion gap: 7 (ref 5–15)
BUN: 15 mg/dL (ref 8–23)
CO2: 26 mmol/L (ref 22–32)
Calcium: 7.8 mg/dL — ABNORMAL LOW (ref 8.9–10.3)
Chloride: 101 mmol/L (ref 98–111)
Creatinine, Ser: 1 mg/dL (ref 0.61–1.24)
GFR, Estimated: >60 mL/min (ref >60)
Glucose, Bld: 109 mg/dL — ABNORMAL HIGH (ref 70–99)
Potassium: 4.4 mmol/L (ref 3.5–5.1)
Sodium: 134 mmol/L — ABNORMAL LOW (ref 135–145)

## 2020-10-28 SURGERY — ESOPHAGOGASTRODUODENOSCOPY (EGD) WITH PROPOFOL
Anesthesia: Monitor Anesthesia Care

## 2020-10-28 MED ORDER — PROPOFOL 500 MG/50ML IV EMUL
INTRAVENOUS | Status: DC | PRN
  Administered 2020-10-28: 150 ug/kg/min via INTRAVENOUS

## 2020-10-28 MED ORDER — HYDROMORPHONE HCL 1 MG/ML IJ SOLN: 0.2500 mg | INTRAMUSCULAR | Status: DC | PRN

## 2020-10-28 MED ORDER — OXYCODONE HCL 5 MG/5ML PO SOLN: 5.0000 mg | Freq: Once | ORAL | Status: DC | PRN

## 2020-10-28 MED ORDER — OXYCODONE HCL 5 MG PO TABS: 5.0000 mg | ORAL_TABLET | Freq: Once | ORAL | Status: DC | PRN

## 2020-10-28 MED ORDER — LACTATED RINGERS IV SOLN: Freq: Once | INTRAVENOUS | Status: AC

## 2020-10-28 MED ORDER — PHENYLEPHRINE 40 MCG/ML (10ML) SYRINGE FOR IV PUSH (FOR BLOOD PRESSURE SUPPORT)
PREFILLED_SYRINGE | INTRAVENOUS | Status: DC | PRN
  Administered 2020-10-28: 80 ug via INTRAVENOUS

## 2020-10-28 MED ORDER — OXYCODONE-ACETAMINOPHEN 5-325 MG PO TABS
1.0000 | ORAL_TABLET | ORAL | Status: DC | PRN
  Administered 2020-10-28 – 2020-10-30 (×9): 2 via ORAL
  Administered 2020-10-31: 1 via ORAL
  Administered 2020-11-02 – 2020-11-04 (×2): 2 via ORAL
  Filled 2020-10-28 (×4): qty 2
  Filled 2020-10-28: qty 1
  Filled 2020-10-28 (×7): qty 2

## 2020-10-28 MED ORDER — FERROUS SULFATE 325 (65 FE) MG PO TABS
325.0000 mg | ORAL_TABLET | Freq: Every day | ORAL | Status: DC
  Administered 2020-10-29 – 2020-11-04 (×7): 325 mg via ORAL
  Filled 2020-10-28 (×7): qty 1

## 2020-10-28 MED ORDER — LIDOCAINE 2% (20 MG/ML) 5 ML SYRINGE
INTRAMUSCULAR | Status: DC | PRN
  Administered 2020-10-28: 40 mg via INTRAVENOUS

## 2020-10-28 MED ORDER — LACTATED RINGERS IV SOLN: INTRAVENOUS | Status: DC | PRN

## 2020-10-28 MED ORDER — HYDROMORPHONE HCL 1 MG/ML IJ SOLN
0.5000 mg | Freq: Every day | INTRAMUSCULAR | Status: DC | PRN
  Administered 2020-10-28 – 2020-10-31 (×3): 0.5 mg via INTRAVENOUS
  Filled 2020-10-28 (×3): qty 0.5

## 2020-10-28 MED ORDER — PROMETHAZINE HCL 25 MG/ML IJ SOLN: 6.2500 mg | INTRAMUSCULAR | Status: DC | PRN

## 2020-10-28 MED ORDER — HYDROCODONE-ACETAMINOPHEN 5-325 MG PO TABS: 1.0000 | ORAL_TABLET | Freq: Four times a day (QID) | ORAL | Status: DC | PRN

## 2020-10-28 SURGICAL SUPPLY — 15 items

## 2020-10-28 NOTE — Consult Note (Signed)
WOC Nurse Consult Note: Reason for Consult: NPWT dressing changes; S/P right foot transmet Wound type: surgical  Pressure Injury POA: NA Measurement:3cm x 10cm x 2cm  Wound bed:100% clean; sutures noted along proximal aspect of the wound edge  Drainage (amount, consistency, odor) bloody in canister, minimal, active oozing while changing dressing Periwound:macerated  Dressing procedure/placement/frequency: Removed old NPWT dressing Filled wound with  __1_ piece of black foam Sealed NPWT dressing at HG Patient received IV and PO pain medication per bedside nurse prior to dressing change Patient tolerated procedure well  WOC nurse will continue to provide NPWT dressing changed due to the complexity of the dressing change.   Alyna Stensland [facility], CNS, [employer] [phone]

## 2020-10-28 NOTE — Plan of Care (Signed)
  Problem: Clinical Measurements: Goal: Ability to avoid or minimize complications of infection will improve Outcome: Progressing   Problem: Skin Integrity: Goal: Skin integrity will improve Outcome: Progressing   Problem: Education: Goal: Knowledge of General Education information will improve Description: Including pain rating scale, medication(s)/side effects and non-pharmacologic comfort measures Outcome: Progressing   Problem: Health Behavior/Discharge Planning: Goal: Ability to manage health-related needs will improve Outcome: Progressing   Problem: Clinical Measurements: Goal: Ability to maintain clinical measurements within normal limits will improve Outcome: Progressing Goal: Will remain free from infection Outcome: Progressing Goal: Diagnostic test results will improve Outcome: Progressing Goal: Respiratory complications will improve Outcome: Progressing Goal: Cardiovascular complication will be avoided Outcome: Progressing   Problem: Activity: Goal: Risk for activity intolerance will decrease Outcome: Progressing   Problem: Nutrition: Goal: Adequate nutrition will be maintained Outcome: Progressing   Problem: Coping: Goal: Level of anxiety will decrease Outcome: Progressing   Problem: Elimination: Goal: Will not experience complications related to bowel motility Outcome: Progressing Goal: Will not experience complications related to urinary retention Outcome: Progressing   Problem: Pain Managment: Goal: General experience of comfort will improve Outcome: Progressing   Problem: Safety: Goal: Ability to remain free from injury will improve Outcome: Progressing   Problem: Skin Integrity: Goal: Risk for impaired skin integrity will decrease Outcome: Progressing   Problem: Education: Goal: Required Educational Video(s) Outcome: Progressing   Problem: Clinical Measurements: Goal: Ability to maintain clinical measurements within normal limits will  improve Outcome: Progressing Goal: Postoperative complications will be avoided or minimized Outcome: Progressing   Problem: Skin Integrity: Goal: Demonstration of wound healing without infection will improve Outcome: Progressing

## 2020-10-28 NOTE — Telephone Encounter (Signed)
Per Dr. Leonides Schanz request, pt has been scheduled for hosp f/u on 12/06/20 @ 830a. Appt reminder has been mailed. Appt will reflect on AVS upon hosp discharge for pt future reference.

## 2020-10-28 NOTE — Op Note (Signed)
[facility] Patient Name: Mitchell [name] Procedure Date : 10/28/2020 MRN: [id number] Attending MD: Particia Lather ,  Date of Birth: July 30, 1950 CSN: [id number] Age: 70 Admit Type: Inpatient Procedure:                Upper GI endoscopy Indications:              Iron deficiency anemia Providers:                Madelyn Brunner" Mitchell [name], Mitchell [name], Technician Referring MD:              Medicines:                Monitored Anesthesia Care Complications:            No immediate complications. Estimated Blood Loss:     Estimated blood loss was minimal. Procedure:                Pre-Anesthesia Assessment:                           - Prior to the procedure, a History and Physical                            was performed, and patient medications and                            allergies were reviewed. The patient's tolerance of                            previous anesthesia was also reviewed. The risks                            and benefits of the procedure and the sedation                            options and risks were discussed with the patient.                            All questions were answered, and informed consent                            was obtained. Prior Anticoagulants: The patient has                            taken no previous anticoagulant or antiplatelet                            agents except for aspirin. ASA Grade Assessment:                            III - A patient with severe systemic disease. After  reviewing the risks and benefits, the patient was                            deemed in satisfactory condition to undergo the                            procedure.                           After obtaining informed consent, the endoscope was                            passed under direct vision. Throughout the                            procedure, the patient's blood pressure, pulse,  and                            oxygen saturations were monitored continuously. The                            [id number]-H190 ([id number]) Olympus endoscope was introduced                            through the mouth, and advanced to the third part                            of duodenum. The upper GI endoscopy was                            accomplished without difficulty. The patient                            tolerated the procedure well. Scope In: Scope Out: Findings:      The examined esophagus was normal.      Localized erythematous mucosa without bleeding was found in the gastric       antrum.      Five angioectasias without bleeding were found in the duodenal bulb, in       the first portion of the duodenum, in the second portion of the duodenum       and in the third portion of the duodenum. Coagulation for bleeding       prevention using argon plasma at 1 liter/minute and 20 watts was       successful. For hemostasis, one hemostatic clip was successfully placed       on an angioectasia in the 2nd portion of the duodenum that did not fully       respond to APC. There was no bleeding at the end of the procedure. Impression:               - Normal esophagus.                           - Erythematous mucosa in the antrum.                           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                            Treated with argon plasma coagulation (APC). Clip                            was placed.                           - No specimens collected. Recommendation:           - Return patient to hospital ward for ongoing care.                           - Check serum H pylori antibody. Treat if positive.                           - Recommend iron supplementation.                           - Patient will need outpatient colonoscopy for                            further evaluation of his iron deficiency anemia.                            We will arrange for GI clinic follow up to  discuss                            this further.                           - The findings and recommendations were discussed                            with the patient. Procedure Code(s):        --- Professional ---                           [phone], Esophagogastroduodenoscopy, flexible,                            transoral; with control of bleeding, any method Diagnosis Code(s):        --- Professional ---                           K31.89, Other diseases of stomach and duodenum                           K31.819, Angiodysplasia of stomach and duodenum                            without bleeding                           D50.9, Iron deficiency anemia, unspecified CPT copyright 2019 [employer]. All  rights reserved. The codes documented in this report are preliminary and upon coder review may  be revised to meet current compliance requirements. Nicole Kindred "Eulah Pont,  10/28/2020 12:41:23 PM Number of Addenda: 0

## 2020-10-28 NOTE — Progress Notes (Signed)
Orthopedic Tech Progress Note Patient Details:  Mitchell [name] [birth year] [id number]  Ortho Devices Type of Ortho Device: Darco shoe Ortho Device/Splint Location: RLE Ortho Device/Splint Interventions: Ordered      [name] [name] 10/28/2020, 8:27 AM

## 2020-10-28 NOTE — Telephone Encounter (Signed)
-----   Message from [name], MD sent at 10/28/2020 12:35 PM EDT ----- Hi Detta Mellin,  Could we schedule GI clinic follow up with me in 1-2 months for IDA?  Thanks, Alan Ripper

## 2020-10-28 NOTE — Op Note (Signed)
    Patient name: Mitchell [name] MRN: [id number] DOB: 17-Dec-1950 Sex: male  10/27/2020 Pre-operative Diagnosis: Right diabetic foot infection Post-operative diagnosis:  Same Surgeon:  Durene Cal Assistants: Clinton Gallant Procedure:   #1: Right transmetatarsal amputation   #2: Placement of wound VAC (14 x 1.5 x 1 cm) Anesthesia: General Blood Loss: Minimal Specimens: Aerobic and anaerobic cultures were sent  Findings: The amputation site was more proximal than a typical transmetatarsal amputation secondary to the patient's foot abscess.  He had good capillary bleeding.  Indications: This is a 70 year old gentleman who presented with a right diabetic foot infection.  He had osteomyelitis and abscess by MRI.  He had a palpable dorsalis pedis pulse.  No additional arterial testing was warranted.  He was consented for a right transmetatarsal amputation, understanding that he may need a more proximal amputation  Procedure:  The patient was identified in the holding area and taken to [facility] OR ROOM 15  The patient was then placed supine on the table. general anesthesia was administered.  The patient was prepped and draped in the usual sterile fashion.  A time out was called and antibiotics were administered.  A PA was necessary to expedite the procedure and assist with technical details.  A fishmouth type incision was made proximal to the patient's foot abscess.  Incision was made with a 10 blade and carried down to the bone.  Periosteal elevator was used to elevate the periosteum.  An oscillating saw was used to transect the metatarsal bone up near the metatarsal head.  Cultures of the wound were sent.  There was good capillary bleeding at the amputation site.  The foot was removed and sent as a specimen.  Because of the location of the skin incision secondary to the infection, I could not close the tissue over the bone and so I used the salt to resect back additional bone which got to the base of the  metatarsal.  Rongeurs were used to further resect the bone.  The bone appeared healthy at this level.  The wound was then irrigated.  Hemostasis was achieved.  I closed the deep tissue with interrupted 2-0 nylon vertical mattress sutures.  I did this so that the skin edges were left open.  I then placed a wound VAC over the open wound.  The wound measured 14 x 1.5 x 1 cm.  The VAC had a good seal.  The patient was then extubated and taken to recovery in stable condition.  There were no immediate complications.   Disposition: To PACU stable.   Juleen China, M.D., [facility] [place] Office: [phone] Pager:  [phone]

## 2020-10-28 NOTE — Transfer of Care (Signed)
Immediate Anesthesia Transfer of Care Note  Patient: Mitchell [name]  Procedure(s) Performed: ESOPHAGOGASTRODUODENOSCOPY (EGD) WITH PROPOFOL HEMOSTASIS CLIP PLACEMENT HOT HEMOSTASIS (ARGON PLASMA COAGULATION/BICAP)  Patient Location: PACU  Anesthesia Type:MAC  Level of Consciousness: drowsy  Airway & Oxygen Therapy: Patient Spontanous Breathing and Patient connected to nasal cannula oxygen  Post-op Assessment: Report given to RN and Post -op Vital signs reviewed and stable  Post vital signs: stable  Last Vitals:  Vitals Value Taken Time  BP 109/46 10/28/20 1241  Temp    Pulse 69 10/28/20 1244  Resp 25 10/28/20 1244  SpO2 100 % 10/28/20 1244  Vitals shown include unvalidated device data.  Last Pain:  Vitals:   10/28/20 1022  TempSrc: Temporal  PainSc: 0-No pain      Patients Stated Pain Goal: 2 (00/51/10 2111)  Complications: No notable events documented.

## 2020-10-28 NOTE — Progress Notes (Addendum)
  Progress Note    10/28/2020 7:39 AM 1 Day Post-Op  Subjective:  no complaints. Says has some increased sensation in plantar aspect of foot. Minimal pain   Vitals:   10/28/20 0420 10/28/20 0736  BP: (!) 151/63 (!) 151/68  Pulse: 84 81  Resp:  16  Temp: 98.9 F (37.2 C) 99.4 F (37.4 C)  SpO2: 92% 94%   Physical Exam: Cardiac:  regular Lungs:  non labored Incisions:  right TMA with wound VAC to suction, good seal. 150 cc bloody output Extremities:  well perfused and warm. 2+ R femoral, DP palpable bilaterally Abdomen:  obese soft non tender Neurologic: alert and oriented  CBC    Component Value Date/Time   WBC 10.1 10/28/2020 0205   RBC 5.13 10/28/2020 0205   HGB 9.6 (L) 10/28/2020 0205   HCT 36.1 (L) 10/28/2020 0205   PLT 451 (H) 10/28/2020 0205   MCV 70.4 (L) 10/28/2020 0205   MCH 18.7 (L) 10/28/2020 0205   MCHC 26.6 (L) 10/28/2020 0205   RDW 31.7 (H) 10/28/2020 0205   LYMPHSABS 1.7 10/25/2020 1445   MONOABS 0.7 10/25/2020 1445   EOSABS 0.2 10/25/2020 1445   BASOSABS 0.1 10/25/2020 1445    BMET    Component Value Date/Time   NA 134 (L) 10/28/2020 0205   K 4.4 10/28/2020 0205   CL 101 10/28/2020 0205   CO2 26 10/28/2020 0205   GLUCOSE 109 (H) 10/28/2020 0205   BUN 15 10/28/2020 0205   CREATININE 1.00 10/28/2020 0205   CALCIUM 7.8 (L) 10/28/2020 0205   GFRNONAA >60 10/28/2020 0205   GFRAA >60 08/29/2014 1627    INR    Component Value Date/Time   INR 1.2 10/25/2020 1550     Intake/Output Summary (Last 24 hours) at 10/28/2020 0739 Last data filed at 10/28/2020 0511 Gross per 24 hour  Intake 1560 ml  Output 1725 ml  Net -165 ml     Assessment/Plan:  70 y.o. male is s/p right TMA 1 Day Post-Op   Doing well post op Wound VAC to TMA change tomorrow Continue Vanc, Metronidazole, Ceftriaxone Final surgical cultures pending Will need to be heel weight bearing only  Graceann Congress, PA-C [facility] [phone] 10/28/2020 7:39 AM  I have seen and evaluated the patient. I agree with the PA note as documented above.  Postop day 1 status post right TMA.  VAC with good seal.  Palpable pedal pulse.  Hopefully VAC change today with wound care.  Ordered Percocet for pain control and IV for breakthrough pain during VAC change.  Cephus Shelling, MD [facility] [place] Office: [phone]

## 2020-10-28 NOTE — Interval H&P Note (Signed)
History and Physical Interval Note:  10/28/2020 10:35 AM  Mitchell [name]  has presented today for surgery, with the diagnosis of Anemia.  The various methods of treatment have been discussed with the patient and family. After consideration of risks, benefits and other options for treatment, the patient has consented to  Procedure(s): ESOPHAGOGASTRODUODENOSCOPY (EGD) WITH PROPOFOL (N/A) as a surgical intervention.  The patient's history has been reviewed, patient examined, no change in status, stable for surgery.  I have reviewed the patient's chart and labs.  Questions were answered to the patient's satisfaction.     [name]

## 2020-10-28 NOTE — Anesthesia Preprocedure Evaluation (Signed)
Anesthesia Evaluation  Patient identified by MRN, date of birth, ID band Patient awake    Reviewed: Allergy & Precautions, NPO status , Patient's Chart, lab work & pertinent test results  Airway Mallampati: I  TM Distance: >3 FB Neck ROM: Full    Dental  (+) Edentulous Upper, Edentulous Lower   Pulmonary neg pulmonary ROS, Current Smoker and Patient abstained from smoking.,    Pulmonary exam normal        Cardiovascular hypertension, + Peripheral Vascular Disease   Rhythm:Regular Rate:Normal     Neuro/Psych negative neurological ROS  negative psych ROS   GI/Hepatic negative GI ROS, Neg liver ROS,   Endo/Other  diabetes, Type 2, Insulin Dependent, Oral Hypoglycemic Agents  Renal/GU negative Renal ROS  negative genitourinary   Musculoskeletal  (+) Arthritis , Osteoarthritis,  Osteomyelitis right foot   Abdominal (+) + obese,   Peds  Hematology  (+) anemia ,   Anesthesia Other Findings   Reproductive/Obstetrics                             Anesthesia Physical  Anesthesia Plan  ASA: 3  Anesthesia Plan: MAC   Post-op Pain Management:    Induction: Intravenous  PONV Risk Score and Plan: Treatment may vary due to age or medical condition  Airway Management Planned: Nasal Cannula  Additional Equipment: None  Intra-op Plan:   Post-operative Plan:   Informed Consent: I have reviewed the patients History and Physical, chart, labs and discussed the procedure including the risks, benefits and alternatives for the proposed anesthesia with the patient or authorized representative who has indicated his/her understanding and acceptance.     Dental advisory given  Plan Discussed with:   Anesthesia Plan Comments: (Lab Results      Component                Value               Date                      WBC                      10.3                10/27/2020                HGB                       9.8 (L)             10/27/2020                HCT                      36.1 (L)            10/27/2020                MCV                      69.4 (L)            10/27/2020                PLT                      492 (H)  10/27/2020           Lab Results      Component                Value               Date                      NA                       135                 10/27/2020                K                        4.7                 10/27/2020                CO2                      26                  10/27/2020                GLUCOSE                  163 (H)             10/27/2020                BUN                      16                  10/27/2020                CREATININE               0.82                10/27/2020                CALCIUM                  8.3 (L)             10/27/2020                GFRNONAA                 >60                 10/27/2020          )        Anesthesia Quick Evaluation

## 2020-10-28 NOTE — Progress Notes (Signed)
PROGRESS NOTE    Mitchell [name]  GEX:[id number] DOB: [birth year] DOA: 10/25/2020 PCP: Kirstie Peri, MD   Chief Complaint  Patient presents with   Foot Pain   Brief Narrative/Hospital Course: 70 year old male with T2DM on insulin, HTN, tobacco abuse PVD obesity, history of diabetic foot ulcers presented to [facility] ER with 3 days 4-day history of right foot redness swelling and drainage.  Patient has been on Bactrim antibiotics and following with outpatient rehab for a month due to open sore on his foot.  He also noticed progressive darkening of his systolic breast 2 to 3 weeks and now black 3 days prior to admission. Hemoccult positive hemodynamically stable hemoglobin 6.1 g potassium 5.2 lactic acid 1.4 WBC 7.5, 1 unit PRBC transfused right foot x-ray showed osteomyelitis Patient was admitted on IV antibiotics ultrasound arterial ABI with resting bilateral ankle (normal limits waveforms were abnormal suggestive of upstream obstructive disease.  MRI of the right foot showed acute osteomyelitis of the fourth metatarsal and proximal phalanx of the fourth toe, plantar soft tissue ulceration with 3.1 cm abscess surrounding the fourth metatarsal head and fourth MTP joint, 2.1 cm abscess overlies the dorsal aspect of the fourth MTP joint, BM ED via on fifth July and fifth metatarsal sustained reactive osteitis, acute/subacute fracture of second toe proximal phalanx, extensive circumferential edema likely cellulitis.  Also muscle denervation/myositis throughout the forefoot. 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   No more episode of hypoglycemia  Assessment & Plan:  Severe diabetic right foot infection with osteomyelitis and abscess forefoot: See MRI results for more detail.   ABI normal.  S/p TMA  rt foot 10/13 w wound vac.  Doing well postop continue heel weight bearing RLE, follow-up final surgical cultures and continue vancomycin ceftriaxone and Flagyl.  Vascular planning for wound VAC to TMA change tomorrow  Acute blood loss anemia likely GI bleeding  Anemia of chronic disease  Iron deficiency anemia Black stool and Hemoccult positive stool: Status post total 3 units PRBC total. HB stable keep on ppi drip, GI planning for further evaluation for the source of his IDA and melena by performing EGD holding off on colonoscopy due to recent surgery  Recent Labs  Lab 10/25/20 1550 10/26/20 0347 10/26/20 1635 10/27/20 0238 10/28/20 0205  HGB 6.1* 7.7* 9.7* 9.8* 9.6*  HCT 26.1* 31.0* 36.3* 36.1* 36.1*    T2DM on long-term insulin: With hypoglycemia 46-stable HbA1c 6.3.  Blood sugar not stable no more hypoglycemia cut down his home insulin significantly, continue  Premeal NovoLog and Lantus 15 units daily hold po meds, keep on ssi. Recent Labs  Lab 10/27/20 1241 10/27/20 1401 10/27/20 1634 10/27/20 1938 10/28/20 0804  GLUCAP 88 142* 169* 124* 94   Thrombocytosis 492 likely reactive.  Improving  Peripheral neuropathy-continue Neurontin  Tobacco abuse: Continue nicotine patch  Class I Obesity:Patient's Body mass index is 34.71 kg/m. : Will benefit with PCP follow-up, weight loss healthy lifestyle and outpatient sleep evaluation  DVT prophylaxis: SCDs Start: 10/25/20 2010 Code Status:   Code Status: Full Code Family Communication: plan of care discussed with patient at bedside. Status is: Inpatient Remains inpatient appropriate because: Ongoing management of his infection with IV antibiotics  Dispo: The patient is from: Home lives alone with doc  Anticipated d/c is to: TBD. needs SNF. PTOT eval tomorrow.              Patient currently is not medically stable to d/c.   Difficult to place patient Yes Objective: Vitals: Today's Vitals    10/27/20 2030 10/28/20 0420 10/28/20 0736 10/28/20 0830  BP:  (!) 151/63 (!) 151/68   Pulse:  84 81   Resp:   16   Temp:  98.9 F (37.2 C) 99.4 F (37.4 C)   TempSrc:  Oral Oral   SpO2:  92% 94%   Weight:      Height:      PainSc: 0-No pain   9    Physical Examination: General exam: AAOx 3, pleasant, older than stated age, weak appearing. HEENT:Oral mucosa moist, Ear/Nose WNL grossly, dentition normal. Respiratory system: bilaterally clear, no use of accessory muscle Cardiovascular system: S1 & S2 +, No JVD,. Gastrointestinal system: Abdomen soft, NT,ND, BS+ Nervous System:Alert, awake, moving extremities and grossly nonfocal Extremities: RLE with wound vac at TMA stite. Skin: No rashes,no icterus. MSK: Normal muscle bulk,tone, power   Medications reviewed:  Scheduled Meds:  empagliflozin  25 mg Oral Daily   gabapentin  300 mg Oral TID   insulin aspart  0-5 Units Subcutaneous QHS   insulin aspart  0-9 Units Subcutaneous TID WC   insulin aspart  5 Units Subcutaneous TID WC   insulin glargine-yfgn  15 Units Subcutaneous Daily   metroNIDAZOLE  500 mg Oral Q12H   nicotine  21 mg Transdermal Daily   pravastatin  20 mg Oral q1800   Continuous Infusions:  cefTRIAXone (ROCEPHIN)  IV 2 g (10/27/20 2020)   pantoprazole 8 mg/hr (10/28/20 0344)   vancomycin 1,000 mg (10/28/20 0511)   Diet Order             Diet NPO time specified  Diet effective midnight                   Intake/Output  Intake/Output Summary (Last 24 hours) at 10/28/2020 0901 Last data filed at 10/28/2020 0511 Gross per 24 hour  Intake 1560 ml  Output 1725 ml  Net -165 ml   Intake/Output from previous day: 10/13 0701 - 10/14 0700 In: 1560 [P.O.:760; I.V.:700; IV Piggyback:100] Out: 1725 [Urine:1500; Drains:100; Blood:125] Net IO Since Admission: 2,970.61 mL [10/28/20 0901]   Weight change:   Wt Readings from Last 3 Encounters:  10/27/20 112.9 kg  07/22/15 112.5 kg  07/15/15 112.5 kg      Consultants:see note  Procedures:see note Antimicrobials: Anti-infectives (From admission, onward)    Start     Dose/Rate Route Frequency Ordered Stop   10/28/20 0500  vancomycin (VANCOCIN) IVPB 1000 mg/200 mL premix        1,000 mg 200 mL/hr over 60 Minutes Intravenous Every 12 hours 10/27/20 1411     10/26/20 0400  vancomycin (VANCOREADY) IVPB 1500 mg/300 mL  Status:  Discontinued        1,500 mg 150 mL/hr over 120 Minutes Intravenous Every 12 hours 10/25/20 1602 10/27/20 1411   10/25/20 2200  metroNIDAZOLE (FLAGYL) tablet 500 mg        500 mg Oral Every 12 hours 10/25/20 2009     10/25/20 2015  cefTRIAXone (ROCEPHIN) 2 g in sodium chloride 0.9 % 100 mL IVPB        2 g 200 mL/hr over 30 Minutes Intravenous Every 24 hours 10/25/20 2009     10/25/20 1430  ciprofloxacin (CIPRO) IVPB  400 mg        400 mg 200 mL/hr over 60 Minutes Intravenous  Once 10/25/20 1424 10/25/20 1631   10/25/20 1430  clindamycin (CLEOCIN) IVPB 600 mg        600 mg 100 mL/hr over 30 Minutes Intravenous  Once 10/25/20 1424 10/25/20 1631   10/25/20 1430  vancomycin (VANCOREADY) IVPB 2000 mg/400 mL        2,000 mg 200 mL/hr over 120 Minutes Intravenous  Once 10/25/20 1428 10/25/20 1727      Culture/Microbiology    Component Value Date/Time   SDES FLUID 10/27/2020 1105   SPECREQUEST DIABETIC ULCER ON RIGHT FOOT SPEC A 10/27/2020 1105   CULT PENDING 10/27/2020 1105   REPTSTATUS PENDING 10/27/2020 1105    Other culture-see note  Unresulted Labs (From admission, onward)     Start     Ordered   10/27/20 0500  CBC  Daily,   R      10/26/20 0717   10/27/20 0500  Basic metabolic panel  Daily,   R      10/26/20 0717          Data Reviewed: I have personally reviewed following labs and imaging studies CBC: Recent Labs  Lab 10/25/20 1445 10/25/20 1550 10/26/20 0347 10/26/20 1635 10/27/20 0238 10/28/20 0205  WBC 7.5  --   --   --  10.3 10.1  NEUTROABS 4.9  --   --   --   --   --   HGB 6.1*  6.1* 7.7* 9.7* 9.8* 9.6*  HCT 25.6* 26.1* 31.0* 36.3* 36.1* 36.1*  MCV 63.8*  --   --   --  69.4* 70.4*  PLT 572*  --   --   --  492* 451*   Basic Metabolic Panel: Recent Labs  Lab 10/25/20 1445 10/27/20 0238 10/28/20 0205  NA 136 135 134*  K 5.2* 4.7 4.4  CL 99 101 101  CO2 30 26 26   GLUCOSE 134* 163* 109*  BUN 14 16 15   CREATININE 0.84 0.82 1.00  CALCIUM 8.5* 8.3* 7.8*   GFR: Estimated Creatinine Clearance: 87.8 mL/min (by C-G formula based on SCr of 1 mg/dL). Liver Function Tests: Recent Labs  Lab 10/25/20 1445  AST 13*  ALT 9  ALKPHOS 115  BILITOT 0.7  PROT 7.5  ALBUMIN 2.8*   No results for input(s): LIPASE, AMYLASE in the last 168 hours. No results for input(s): AMMONIA in the last 168 hours. Coagulation Profile: Recent Labs  Lab 10/25/20 1550  INR 1.2   Cardiac Enzymes: No results for input(s): CKTOTAL, CKMB, CKMBINDEX, TROPONINI in the last 168 hours. BNP (last 3 results) No results for input(s): PROBNP in the last 8760 hours. HbA1C: Recent Labs    10/25/20 1550  HGBA1C 6.3*   CBG: Recent Labs  Lab 10/27/20 1241 10/27/20 1401 10/27/20 1634 10/27/20 1938 10/28/20 0804  GLUCAP 88 142* 169* 124* 94   Lipid Profile: No results for input(s): CHOL, HDL, LDLCALC, TRIG, CHOLHDL, LDLDIRECT in the last 72 hours. Thyroid Function Tests: No results for input(s): TSH, T4TOTAL, FREET4, T3FREE, THYROIDAB in the last 72 hours. Anemia Panel: Recent Labs    10/25/20 1445  VITAMINB12 265  FOLATE 8.9  FERRITIN 5*  TIBC 410  IRON 9*  RETICCTPCT 2.2   Sepsis Labs: Recent Labs  Lab 10/25/20 1445 10/25/20 1654  LATICACIDVEN 1.4 1.3    Recent Results (from the past 240 hour(s))  Culture, blood (routine x 2)  Status: None (Preliminary result)   Collection Time: 10/25/20  2:45 PM   Specimen: Right Antecubital; Blood  Result Value Ref Range Status   Specimen Description RIGHT ANTECUBITAL  Final   Special Requests   Final    BOTTLES DRAWN  AEROBIC AND ANAEROBIC Blood Culture results may not be optimal due to an excessive volume of blood received in culture bottles   Culture   Final    NO GROWTH 3 DAYS Performed at [facility], [address] [place], [place] [address]    Report Status PENDING  Incomplete  Culture, blood (routine x 2)     Status: None (Preliminary result)   Collection Time: 10/25/20  2:45 PM   Specimen: Left Antecubital; Blood  Result Value Ref Range Status   Specimen Description LEFT ANTECUBITAL  Final   Special Requests   Final    BOTTLES DRAWN AEROBIC AND ANAEROBIC Blood Culture results may not be optimal due to an excessive volume of blood received in culture bottles   Culture   Final    NO GROWTH 3 DAYS Performed at [facility], [address], [place], [place] [address]    Report Status PENDING  Incomplete  Resp Panel by RT-PCR (Flu A&B, Covid) Nasopharyngeal Swab     Status: None   Collection Time: 10/25/20  3:45 PM   Specimen: Nasopharyngeal Swab; Nasopharyngeal(NP) swabs in vial transport medium  Result Value Ref Range Status   SARS Coronavirus 2 by RT PCR NEGATIVE NEGATIVE Final    Comment: (NOTE) SARS-CoV-2 target nucleic acids are NOT DETECTED.  The SARS-CoV-2 RNA is generally detectable in upper respiratory specimens during the acute phase of infection. The lowest concentration of SARS-CoV-2 viral copies this assay can detect is 138 copies/mL. A negative result does not preclude SARS-Cov-2 infection and should not be used as the sole basis for treatment or other patient management decisions. A negative result may occur with  improper specimen collection/handling, submission of specimen other than nasopharyngeal swab, presence of viral mutation(s) within the areas targeted by this assay, and inadequate number of viral copies(<138 copies/mL). A negative result must be combined with clinical observations, patient history, and epidemiological information. The expected result is  Negative.  Fact Sheet for Patients:  BloggerCourse.com  Fact Sheet for Healthcare Providers:  SeriousBroker.it  This test is no t yet approved or cleared by the [place] FDA and  has been authorized for detection and/or diagnosis of SARS-CoV-2 by FDA under an Emergency Use Authorization (EUA). This EUA will remain  in effect (meaning this test can be used) for the duration of the COVID-19 declaration under Section 564(b)(1) of the Act, 21 U.S.C.section 360bbb-3(b)(1), unless the authorization is terminated  or revoked sooner.       Influenza A by PCR NEGATIVE NEGATIVE Final   Influenza B by PCR NEGATIVE NEGATIVE Final    Comment: (NOTE) The Xpert Xpress SARS-CoV-2/FLU/RSV plus assay is intended as an aid in the diagnosis of influenza from Nasopharyngeal swab specimens and should not be used as a sole basis for treatment. Nasal washings and aspirates are unacceptable for Xpert Xpress SARS-CoV-2/FLU/RSV testing.  Fact Sheet for Patients: BloggerCourse.com  Fact Sheet for Healthcare Providers: SeriousBroker.it  This test is not yet approved or cleared by the [place] FDA and has been authorized for detection and/or diagnosis of SARS-CoV-2 by FDA under an Emergency Use Authorization (EUA). This EUA will remain in effect (meaning this test can be used) for the duration of the COVID-19 declaration under  Section 564(b)(1) of the Act, 21 U.S.C. section 360bbb-3(b)(1), unless the authorization is terminated or revoked.  Performed at [facility], [address], [place], [place] [address]   Aerobic Culture w Gram Stain (superficial specimen)     Status: None (Preliminary result)   Collection Time: 10/25/20  3:55 PM   Specimen: Foot; Wound  Result Value Ref Range Status   Specimen Description   Final    FOOT Performed at [facility], [address], [place], [place]  [address]    Special Requests   Final    RIGHT Performed at [facility], [address], [place], [place] [address]    Gram Stain   Final    NO SQUAMOUS EPITHELIAL CELLS SEEN FEW WBC SEEN FEW GRAM NEGATIVE RODS MODERATE GRAM POSITIVE COCCI    Culture   Final    ABUNDANT STREPTOCOCCUS AGALACTIAE TESTING AGAINST S. AGALACTIAE NOT ROUTINELY PERFORMED DUE TO PREDICTABILITY OF AMP/PEN/VAN SUSCEPTIBILITY. ABUNDANT ENTEROCOCCUS FAECALIS CULTURE REINCUBATED FOR BETTER GROWTH Performed at [facility], [address], [place], [place] [address]    Report Status PENDING  Incomplete  Aerobic/Anaerobic Culture w Gram Stain (surgical/deep wound)     Status: None (Preliminary result)   Collection Time: 10/27/20 11:05 AM   Specimen: PATH Other; Body Fluid  Result Value Ref Range Status   Specimen Description FLUID  Final   Special Requests DIABETIC ULCER ON RIGHT FOOT SPEC A  Final   Gram Stain   Final    FEW WBC PRESENT, PREDOMINANTLY MONONUCLEAR FEW GRAM POSITIVE COCCI IN PAIRS RARE GRAM VARIABLE ROD Performed at [facility], [address], [place], [place] [address]    Culture PENDING  Incomplete   Report Status PENDING  Incomplete     Radiology Studies: No results found.   LOS: 3 days   Lanae Boast, MD [facility]  10/28/2020, 9:01 AM

## 2020-10-28 NOTE — Progress Notes (Signed)
Patient returned to floor. Awake and oriented, reports pain and hunger. Clarified diet order with GI MD, and pain medication administered, see MAR. Wound vac intact, dressing clean dry, intact.

## 2020-10-28 NOTE — Anesthesia Procedure Notes (Signed)
Procedure Name: MAC Date/Time: 10/28/2020 12:05 PM Performed by: Barrington Ellison, CRNA Pre-anesthesia Checklist: Patient identified, Emergency Drugs available, Suction available, Patient being monitored and Timeout performed Patient Re-evaluated:Patient Re-evaluated prior to induction Oxygen Delivery Method: Nasal cannula

## 2020-10-29 DIAGNOSIS — M868X7 Other osteomyelitis, ankle and foot: Secondary | ICD-10-CM | POA: Diagnosis not present

## 2020-10-29 LAB — AEROBIC CULTURE W GRAM STAIN (SUPERFICIAL SPECIMEN): Gram Stain: NONE SEEN

## 2020-10-29 LAB — BASIC METABOLIC PANEL
Anion gap: 7 (ref 5–15)
BUN: 18 mg/dL (ref 8–23)
CO2: 25 mmol/L (ref 22–32)
Calcium: 7.8 mg/dL — ABNORMAL LOW (ref 8.9–10.3)
Chloride: 100 mmol/L (ref 98–111)
Creatinine, Ser: 0.99 mg/dL (ref 0.61–1.24)
GFR, Estimated: >60 mL/min (ref >60)
Glucose, Bld: 151 mg/dL — ABNORMAL HIGH (ref 70–99)
Potassium: 4.6 mmol/L (ref 3.5–5.1)
Sodium: 132 mmol/L — ABNORMAL LOW (ref 135–145)

## 2020-10-29 LAB — CBC
HCT: 32.3 % — ABNORMAL LOW (ref 39.0–52.0)
Hemoglobin: 8.3 g/dL — ABNORMAL LOW (ref 13.0–17.0)
MCH: 18.4 pg — ABNORMAL LOW (ref 26.0–34.0)
MCHC: 25.7 g/dL — ABNORMAL LOW (ref 30.0–36.0)
MCV: 71.8 fL — ABNORMAL LOW (ref 80.0–100.0)
Platelets: 389 10*3/uL (ref 150–400)
RBC: 4.5 MIL/uL (ref 4.22–5.81)
RDW: 32 % — ABNORMAL HIGH (ref 11.5–15.5)
WBC: 9.5 10*3/uL (ref 4.0–10.5)
nRBC: 0 % (ref 0.0–0.2)

## 2020-10-29 LAB — GLUCOSE, CAPILLARY
Glucose-Capillary: 153 mg/dL — ABNORMAL HIGH (ref 70–99)
Glucose-Capillary: 146 mg/dL — ABNORMAL HIGH (ref 70–99)
Glucose-Capillary: 127 mg/dL — ABNORMAL HIGH (ref 70–99)
Glucose-Capillary: 123 mg/dL — ABNORMAL HIGH (ref 70–99)

## 2020-10-29 MED ORDER — SENNOSIDES-DOCUSATE SODIUM 8.6-50 MG PO TABS
1.0000 | ORAL_TABLET | Freq: Every day | ORAL | Status: DC
  Administered 2020-10-29 – 2020-11-04 (×5): 1 via ORAL
  Filled 2020-10-29 (×6): qty 1

## 2020-10-29 MED ORDER — POLYETHYLENE GLYCOL 3350 17 G PO PACK
17.0000 g | PACK | Freq: Every day | ORAL | Status: DC
  Administered 2020-10-29 – 2020-11-04 (×3): 17 g via ORAL
  Filled 2020-10-29 (×5): qty 1

## 2020-10-29 NOTE — Progress Notes (Signed)
[facility]  Subjective  -pain is better controlled today.  Underwent VAC change yesterday with wound care.   Objective (!) 140/58 76 98.8 F (37.1 C) (Oral) (!) 22 94%  Intake/Output Summary (Last 24 hours) at 10/29/2020 0758 Last data filed at 10/29/2020 0128 Gross per 24 hour  Intake 1233.15 ml  Output 1100 ml  Net 133.15 ml    Right TMA VAC with good seal  Laboratory Lab Results: Recent Labs    10/28/20 0205 10/29/20 0114  WBC 10.1 9.5  HGB 9.6* 8.3*  HCT 36.1* 32.3*  PLT 451* 389   BMET Recent Labs    10/28/20 0205 10/29/20 0114  NA 134* 132*  K 4.4 4.6  CL 101 100  CO2 26 25  GLUCOSE 109* 151*  BUN 15 18  CREATININE 1.00 0.99  CALCIUM 7.8* 7.8*    COAG Lab Results  Component Value Date   INR 1.2 10/25/2020   No results found for: PTT  Assessment/Planning:  70 year old male status post right TMA by Dr. Myra Gianotti on 10/27/20.  VAC change yesterday by wound care.  Goal will be next VAC change on Monday with VAC changes on Monday Wednesday Friday.  He has palpable pedal pulse.  Pain is better controlled since we ordered Percocet yesterday.  Cephus Shelling 10/29/2020 7:58 AM --

## 2020-10-29 NOTE — Hospital Course (Signed)
Mr. Freas is a 70 year old male with T2DM on insulin, HTN, tobacco abuse, PVD, obesity, history of diabetic foot ulcers who presented to [facility] ER with several day complaint of right foot redness swelling and drainage.   His right foot has been an ongoing issue for years he stated on admission with a difficulty healing ulcer. With further work-up he was also found to have dark stools and his hemoglobin noted to be 6.1 g/dL. He was admitted for IV antibiotics, evaluation with vascular surgery and GI. 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  Surgery took place on 10/27/20 followed by wound VAC placement.  GI evaluation then took place and he underwent EGD on 10/28/2020.  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  Outpatient colonoscopy was also recommended once patient is discharged for further evaluation of his iron deficiency anemia.

## 2020-10-29 NOTE — Progress Notes (Signed)
Progress Note    Mitchell [name]   EXB:[id number]  DOB: 21-Jun-1950  DOA: 10/25/2020     4 Date of Service: 10/29/2020   Clinical Course Mitchell [name] is a 70 year old male with T2DM on insulin, HTN, tobacco abuse, PVD, obesity, history of diabetic foot ulcers who presented to [facility] ER with several day complaint of right foot redness swelling and drainage.   His right foot has been an ongoing issue for years he stated on admission with a difficulty healing ulcer. With further work-up he was also found to have dark stools and his hemoglobin noted to be 6.1 g/dL. He was admitted for IV antibiotics, evaluation with vascular surgery and GI. 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  Surgery took place on 10/27/20 followed by wound VAC placement.  GI evaluation then took place and he underwent EGD on 10/28/2020.  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  Outpatient colonoscopy was also recommended once patient is discharged for further evaluation of his iron deficiency anemia.   Assessment and Plan * Foot osteomyelitis, right (HCC) - Diagnosed on MRI foot on admission - Underwent transmetatarsal amputation with vascular surgery on 10/27/2020 - Wound VAC in place with changes on Monday, Wednesday, Friday. - Continue vancomycin, Rocephin, Flagyl.  Tentative plan is for 2 weeks of antibiotics from surgery; likely can de-escalate to oral regimen at discharge or before -Continue pain control -E faecalis and group B strep noted on wound cultures from admission  GI bleed - Hemoglobin nadir 6.1 g/dL.  Responded well to blood transfusion - Currently hemoglobin remaining stable.  Received total of 4 units PRBC during hospitalization - Underwent EGD on 10/28/2020 revealing 5 AVMs nonbleeding; treated with APC and 1 clip - Outpatient follow-up with GI for colonoscopy for further IDA  evaluation  Blood loss anemia - see GIB  Type 2 diabetes mellitus with foot ulcer (HCC) - A1c 6.3% on 10/25/2020 - Continue CBG monitoring and SSI - Resume metformin at discharge  Tobacco abuse Nicotine patch  Diabetic ulcer of right foot (HCC) - See osteomyelitis  Peripheral neuropathy - Continue neurontin.  Obesity - Complicates overall prognosis and care - Body mass index is 34.71 kg/m. - Weight Loss and Dietary Counseling given     Subjective:  No events overnight.  Resting comfortably in bed when seen.  Pain control appropriate.  Feels a little constipated and is amenable for trial of laxative regimen today.  Objective Vitals:   10/28/20 1358 10/28/20 1604 10/28/20 2009 10/29/20 0747  BP: (!) 128/56 (!) 104/49 (!) 111/48 (!) 140/58  Pulse: 79 90 88 76  Resp: 16 16 19  (!) 22  Temp: 98.3 F (36.8 C) 98.9 F (37.2 C) 99.1 F (37.3 C) 98.8 F (37.1 C)  TempSrc: Oral Oral Oral Oral  SpO2: 92% 91% 90% 94%  Weight:      Height:       112.9 kg  Vital signs were reviewed and unremarkable.   Exam Physical Exam Constitutional:      General: He is not in acute distress.    Appearance: Normal appearance. He is obese. He is not ill-appearing.  HENT:     Head: Normocephalic and atraumatic.     Mouth/Throat:     Mouth: Mucous membranes are moist.  Eyes:     Extraocular Movements: Extraocular movements intact.  Cardiovascular:     Rate and Rhythm: Normal  rate and regular rhythm.  Pulmonary:     Effort: Pulmonary effort is normal.     Breath sounds: Normal breath sounds.  Abdominal:     General: Bowel sounds are normal. There is no distension.     Palpations: Abdomen is soft.     Tenderness: There is no abdominal tenderness.  Musculoskeletal:        General: Normal range of motion.     Cervical back: Normal range of motion and neck supple.     Comments: Right foot TMA noted with WV in place and good seal and granulation tissues at edges; 1+ LLE edema   Skin:    General: Skin is warm and dry.  Neurological:     General: No focal deficit present.     Mental Status: He is alert.  Psychiatric:        Mood and Affect: Mood normal.        Behavior: Behavior normal.     Labs / Other Information My review of labs, imaging, notes and other tests shows no new significant findings.    Disposition Plan: Status is: Inpatient  Remains inpatient appropriate because: Ongoing IV antibiotics; wound VAC dressing changes  Time spent: Greater than 50% of the 35 minute visit was spent in counseling/coordination of care for the patient as laid out in the A&P.  Mitchell Chamber, MD [facility] 10/29/2020, 1:57 PM

## 2020-10-30 ENCOUNTER — Encounter (HOSPITAL_COMMUNITY): Payer: Self-pay | Admitting: Internal Medicine

## 2020-10-30 DIAGNOSIS — M868X7 Other osteomyelitis, ankle and foot: Secondary | ICD-10-CM | POA: Diagnosis not present

## 2020-10-30 LAB — AEROBIC/ANAEROBIC CULTURE W GRAM STAIN (SURGICAL/DEEP WOUND)

## 2020-10-30 LAB — GLUCOSE, CAPILLARY
Glucose-Capillary: 132 mg/dL — ABNORMAL HIGH (ref 70–99)
Glucose-Capillary: 181 mg/dL — ABNORMAL HIGH (ref 70–99)
Glucose-Capillary: 120 mg/dL — ABNORMAL HIGH (ref 70–99)
Glucose-Capillary: 136 mg/dL — ABNORMAL HIGH (ref 70–99)

## 2020-10-30 LAB — CBC WITH DIFFERENTIAL/PLATELET
Abs Immature Granulocytes: 0.06 10*3/uL (ref 0.00–0.07)
Basophils Absolute: 0 10*3/uL (ref 0.0–0.1)
Basophils Relative: 0 %
Eosinophils Absolute: 0.3 10*3/uL (ref 0.0–0.5)
Eosinophils Relative: 3 %
HCT: 32.7 % — ABNORMAL LOW (ref 39.0–52.0)
Hemoglobin: 8.5 g/dL — ABNORMAL LOW (ref 13.0–17.0)
Immature Granulocytes: 1 %
Lymphocytes Relative: 9 %
Lymphs Abs: 0.9 10*3/uL (ref 0.7–4.0)
MCH: 18.4 pg — ABNORMAL LOW (ref 26.0–34.0)
MCHC: 26 g/dL — ABNORMAL LOW (ref 30.0–36.0)
MCV: 70.9 fL — ABNORMAL LOW (ref 80.0–100.0)
Monocytes Absolute: 0.9 10*3/uL (ref 0.1–1.0)
Monocytes Relative: 8 %
Neutro Abs: 8.7 10*3/uL — ABNORMAL HIGH (ref 1.7–7.7)
Neutrophils Relative %: 79 %
Platelets: 349 10*3/uL (ref 150–400)
RBC: 4.61 MIL/uL (ref 4.22–5.81)
RDW: 32.2 % — ABNORMAL HIGH (ref 11.5–15.5)
WBC: 10.9 10*3/uL — ABNORMAL HIGH (ref 4.0–10.5)
nRBC: 0 % (ref 0.0–0.2)

## 2020-10-30 LAB — BASIC METABOLIC PANEL
Anion gap: 8 (ref 5–15)
BUN: 16 mg/dL (ref 8–23)
CO2: 24 mmol/L (ref 22–32)
Calcium: 7.8 mg/dL — ABNORMAL LOW (ref 8.9–10.3)
Chloride: 100 mmol/L (ref 98–111)
Creatinine, Ser: 0.99 mg/dL (ref 0.61–1.24)
GFR, Estimated: >60 mL/min (ref >60)
Glucose, Bld: 116 mg/dL — ABNORMAL HIGH (ref 70–99)
Potassium: 4.2 mmol/L (ref 3.5–5.1)
Sodium: 132 mmol/L — ABNORMAL LOW (ref 135–145)

## 2020-10-30 LAB — CULTURE, BLOOD (ROUTINE X 2)
Culture: NO GROWTH
Culture: NO GROWTH

## 2020-10-30 LAB — MAGNESIUM: Magnesium: 2.1 mg/dL (ref 1.7–2.4)

## 2020-10-30 LAB — VANCOMYCIN, PEAK: Vancomycin Pk: 45 ug/mL — ABNORMAL HIGH (ref 30–40)

## 2020-10-30 MED ORDER — SODIUM CHLORIDE 0.9 % IV SOLN
3.0000 g | Freq: Three times a day (TID) | INTRAVENOUS | Status: DC
  Administered 2020-10-30 – 2020-11-04 (×16): 3 g via INTRAVENOUS
  Filled 2020-10-30 (×16): qty 8

## 2020-10-30 NOTE — Progress Notes (Signed)
Progress Note    Mitchell [name]   YQI:[id number]  DOB: [birth year]  DOA: 10/25/2020     5 Date of Service: 10/30/2020   Clinical Course Mitchell [name] is a 70 year old male with T2DM on insulin, HTN, tobacco abuse, PVD, obesity, history of diabetic foot ulcers who presented to [facility] ER with several day complaint of right foot redness swelling and drainage.   His right foot has been an ongoing issue for years he stated on admission with a difficulty healing ulcer. With further work-up he was also found to have dark stools and his hemoglobin noted to be 6.1 g/dL. He was admitted for IV antibiotics, evaluation with vascular surgery and GI. 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  Surgery took place on 10/27/20 followed by wound VAC placement.  GI evaluation then took place and he underwent EGD on 10/28/2020.  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  Outpatient colonoscopy was also recommended once patient is discharged for further evaluation of his iron deficiency anemia.   Assessment and Plan * Foot osteomyelitis, right (HCC) - Diagnosed on MRI foot on admission - Underwent transmetatarsal amputation with vascular surgery on 10/27/2020 - Wound VAC in place with changes on Monday, Wednesday, Friday. -Has been on vancomycin, Rocephin, Flagyl since admission.  Still some calor noted in right lower leg on exam on 10/30/2020 but tenderness has improved.  Cultures reviewed.  At this time will de-escalate to Unasyn to continue coverage and discharged on Augmentin to complete course; PCN allergy noted, monitor for any rash/itching after starting Unasyn  -E faecalis and group B strep noted on wound cultures from admission  GI bleed - Hemoglobin nadir 6.1 g/dL.  Responded well to blood transfusion - Currently hemoglobin remaining stable.  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  Obesity - Complicates overall prognosis and care - Body mass index is 34.71 kg/m. - Weight Loss and Dietary Counseling given     Subjective:  No events overnight.  Did have a good bowel movement multiple times since yesterday after laxatives started.  Endorses improvement in his pain.  Understands plan is for wound VAC change tomorrow. He also states he does not like the surgical shoe that he will need to wear at discharge and wondering if there is an alternative.  Objective Vitals:   10/29/20 1617 10/29/20 2005 10/30/20 0403 10/30/20 1002  BP: (!) 148/54 131/67 (!) 136/56 (!) 133/44  Pulse: 94 97 83 75  Resp: 20 18 20 20   Temp: 99.8 F (37.7 C) 98.4 F (36.9 C) 98.6 F (37 C) 98.8 F (37.1 C)  TempSrc: Oral Oral Oral Oral  SpO2: 96% 96% 93% 95%  Weight:      Height:       112.9 kg  Vital signs were reviewed and unremarkable.   Exam Physical Exam Constitutional:      Appearance: Normal appearance. He is not ill-appearing.  HENT:  Head: Normocephalic and atraumatic.     Mouth/Throat:     Mouth: Mucous membranes are moist.  Eyes:     Extraocular Movements: Extraocular movements intact.  Cardiovascular:     Rate and Rhythm: Normal rate and regular rhythm.  Pulmonary:     Effort: Pulmonary effort is normal.     Breath sounds: Normal breath sounds. No wheezing.  Abdominal:     General: Bowel sounds are normal. There is no distension.     Palpations: Abdomen is soft.      Tenderness: There is no abdominal tenderness.  Musculoskeletal:        General: Normal range of motion.     Cervical back: Normal range of motion and neck supple.     Comments: Wound VAC and dressing in place on right foot.  Still some ongoing swelling approximately 1+ in the right lower extremity with some associated calor and mild erythema but overall improving  Skin:    General: Skin is warm and dry.  Neurological:     General: No focal deficit present.     Mental Status: He is alert.  Psychiatric:        Mood and Affect: Mood normal.        Behavior: Behavior normal.     Labs / Other Information My review of labs, imaging, notes and other tests shows no new significant findings.    Disposition Plan: Status is: Inpatient  Remains inpatient appropriate because: Ongoing pain control and wound VAC changes    Time spent: Greater than 50% of the 35 minute visit was spent in counseling/coordination of care for the patient as laid out in the A&P.  Lewie Chamber, MD [facility] 10/30/2020, 11:57 AM

## 2020-10-30 NOTE — Progress Notes (Signed)
Pharmacy Antibiotic Note  Mitchell [name] is a 70 y.o. 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  Pharmacy has been consulted for vancomycin dosing. Also on ceftriaxone and metronidazole per team.  10/13 Vancomycin 1000mg  Q 12 hr Scr used: 0.8 mg/dL Weight: kg Vd coeff: 0.5 L/kg Est AUC: 440  Vanc Peak 10/16 @ 06:17= 45 Vanc trough 10/16 1630:   Plan: Continue Vancomycin 1g Q12 hr Follow up levels today for adjustments if needed Ceftriaxone and metronidazole per team  Monitor cultures, clinical status, renal fx Narrow abx as able and f/u duration    Height: 5\' 11"  (180.3 cm) Weight: 112.9 kg (248 lb 14.4 oz) IBW/kg (Calculated) : 75.3  Temp (24hrs), Avg:98.9 F (37.2 C), Min:98.4 F (36.9 C), Max:99.8 F (37.7 C)  Recent Labs  Lab 10/25/20 1445 10/25/20 1654 10/27/20 0238 10/28/20 0205 10/29/20 0114 10/30/20 0115 10/30/20 0617  WBC 7.5  --  10.3 10.1 9.5 10.9*  --   CREATININE 0.84  --  0.82 1.00 0.99 0.99  --   LATICACIDVEN 1.4 1.3  --   --   --   --   --   VANCOPEAK  --   --   --   --   --   --  45*     Estimated Creatinine Clearance: 88.7 mL/min (by C-G formula based on SCr of 0.99 mg/dL).    Allergies  Allergen Reactions   Penicillins Itching and Rash    Has patient had a PCN reaction causing immediate rash, facial/tongue/throat swelling, SOB or lightheadedness with hypotension: Yes Has patient had a PCN reaction causing severe rash involving mucus membranes or skin necrosis: No Has patient had a PCN reaction that required hospitalization No Has patient had a PCN reaction occurring within the last 10 years: No If all of the above answers are "NO", then may proceed with Cephalosporin use.     Antimicrobials this admission: PTA bactrim 9/27>> 10/11 Clindamycin 600mg  IV x 1   Cipro 400mg  IV x 1   Vancomycin  10/11 >>   Ceftriaxone 10/11>> Flagyl  10/11>>  Dose adjustments this admission: 10/13 Vanc 1.5g q12 hr> 1g q12 hr  Microbiology results: 10/11 BCx: ngtd 10/11 wound cx: Strep agalactiae, E.faecalis, Mixed anaerobes  10/13 OR culture: pending    Thank you for allowing pharmacy to be a part of this patient's care.  , PharmD PGY2 Infectious Diseases Pharmacy Resident   Please check [email or web address] for unit-specific pharmacy phone numbers

## 2020-10-31 DIAGNOSIS — M868X7 Other osteomyelitis, ankle and foot: Secondary | ICD-10-CM | POA: Diagnosis not present

## 2020-10-31 LAB — MAGNESIUM: Magnesium: 2.2 mg/dL (ref 1.7–2.4)

## 2020-10-31 LAB — CBC WITH DIFFERENTIAL/PLATELET
Abs Immature Granulocytes: 0.02 10*3/uL (ref 0.00–0.07)
Basophils Absolute: 0 10*3/uL (ref 0.0–0.1)
Basophils Relative: 1 %
Eosinophils Absolute: 0.5 10*3/uL (ref 0.0–0.5)
Eosinophils Relative: 6 %
HCT: 33.1 % — ABNORMAL LOW (ref 39.0–52.0)
Hemoglobin: 8.7 g/dL — ABNORMAL LOW (ref 13.0–17.0)
Immature Granulocytes: 0 %
Lymphocytes Relative: 12 %
Lymphs Abs: 1 10*3/uL (ref 0.7–4.0)
MCH: 19 pg — ABNORMAL LOW (ref 26.0–34.0)
MCHC: 26.3 g/dL — ABNORMAL LOW (ref 30.0–36.0)
MCV: 72.1 fL — ABNORMAL LOW (ref 80.0–100.0)
Monocytes Absolute: 1 10*3/uL (ref 0.1–1.0)
Monocytes Relative: 13 %
Neutro Abs: 5.5 10*3/uL (ref 1.7–7.7)
Neutrophils Relative %: 68 %
Platelets: 351 10*3/uL (ref 150–400)
RBC: 4.59 MIL/uL (ref 4.22–5.81)
RDW: 31.8 % — ABNORMAL HIGH (ref 11.5–15.5)
WBC: 8.1 10*3/uL (ref 4.0–10.5)
nRBC: 0 % (ref 0.0–0.2)

## 2020-10-31 LAB — H. PYLORI ANTIBODY, IGG: H Pylori IgG: 0.95 Index Value — ABNORMAL HIGH (ref 0.00–0.79)

## 2020-10-31 LAB — GLUCOSE, CAPILLARY
Glucose-Capillary: 144 mg/dL — ABNORMAL HIGH (ref 70–99)
Glucose-Capillary: 149 mg/dL — ABNORMAL HIGH (ref 70–99)
Glucose-Capillary: 119 mg/dL — ABNORMAL HIGH (ref 70–99)
Glucose-Capillary: 148 mg/dL — ABNORMAL HIGH (ref 70–99)

## 2020-10-31 LAB — SURGICAL PATHOLOGY

## 2020-10-31 LAB — BASIC METABOLIC PANEL
Anion gap: 7 (ref 5–15)
BUN: 18 mg/dL (ref 8–23)
CO2: 26 mmol/L (ref 22–32)
Calcium: 8.3 mg/dL — ABNORMAL LOW (ref 8.9–10.3)
Chloride: 101 mmol/L (ref 98–111)
Creatinine, Ser: 1.13 mg/dL (ref 0.61–1.24)
GFR, Estimated: >60 mL/min (ref >60)
Glucose, Bld: 109 mg/dL — ABNORMAL HIGH (ref 70–99)
Potassium: 5.1 mmol/L (ref 3.5–5.1)
Sodium: 134 mmol/L — ABNORMAL LOW (ref 135–145)

## 2020-10-31 NOTE — Anesthesia Postprocedure Evaluation (Signed)
Anesthesia Post Note  Patient: Lewi Drost  Procedure(s) Performed: ESOPHAGOGASTRODUODENOSCOPY (EGD) WITH PROPOFOL HEMOSTASIS CLIP PLACEMENT HOT HEMOSTASIS (ARGON PLASMA COAGULATION/BICAP)     Patient location during evaluation: PACU Anesthesia Type: MAC Level of consciousness: awake and alert Pain management: pain level controlled Vital Signs Assessment: post-procedure vital signs reviewed and stable Respiratory status: spontaneous breathing, nonlabored ventilation and respiratory function stable Cardiovascular status: blood pressure returned to baseline and stable Postop Assessment: no apparent nausea or vomiting Anesthetic complications: no   No notable events documented.  Last Vitals:  Vitals:   10/30/20 2023 10/31/20 0544  BP: 136/63 (!) 116/56  Pulse: 79 85  Resp: 17 19  Temp: 36.9 C 36.8 C  SpO2: 95% 90%    Last Pain:  Vitals:   10/31/20 0544  TempSrc: Oral  PainSc:                  Lynda Rainwater

## 2020-10-31 NOTE — Evaluation (Signed)
Physical Therapy Evaluation Patient Details Name: Mitchell [name] MRN: [id number] DOB: May 26, 1950 Today's Date: 10/31/2020  History of Present Illness  Mitchell [name] is a 70 year old male admitted 10/11 who presented to [facility] ER with several day complaint of right foot redness swelling and drainage. 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  Surgery took place on 10/27/20 followed by wound VAC placement. PMH:  T2DM on insulin, HTN, tobacco abuse, PVD, obesity, history of diabetic foot ulcers  Clinical Impression  Pt admitted with above diagnosis. Pt was able to ambulate with min guard assist and cues with RW. Pt should progress well.  Pt currently with functional limitations due to the deficits listed below (see PT Problem List). Pt will benefit from skilled PT to increase their independence and safety with mobility to allow discharge to the venue listed below.          Recommendations for follow up therapy are one component of a multi-disciplinary discharge planning process, led by the attending physician.  Recommendations may be updated based on patient status, additional functional criteria and insurance authorization.  Follow Up Recommendations Home health PT    Equipment Recommendations  Rolling walker with 5" wheels    Recommendations for Other Services       Precautions / Restrictions Precautions Precautions: Fall Precaution Comments: Wound VAC Required Braces or Orthoses: Other Brace Other Brace: Darco shoe Restrictions RLE Weight Bearing: Partial weight bearing Other Position/Activity Restrictions: heel weight bearing on Darco shoe      Mobility  Bed Mobility Overal bed mobility: Needs Assistance Bed Mobility: Supine to Sit     Supine to sit: Supervision          Transfers Overall transfer level: Needs assistance Equipment used: Rolling walker (2 wheeled) Transfers: Sit to/from Stand Sit to Stand: Min  guard         General transfer comment: cues for hand placement  Ambulation/Gait Ambulation/Gait assistance: Min guard;+2 safety/equipment Gait Distance (Feet): 80 Feet Assistive device: Rolling walker (2 wheeled) Gait Pattern/deviations: Step-to pattern;Decreased stance time - right;Decreased weight shift to right;Trunk flexed;Antalgic   Gait velocity interpretation: <1.31 ft/sec, indicative of household ambulator General Gait Details: Pt was able to ambulate to 3N1 and had BM and then ambulated into hallway.  Chair follow  Stairs            Wheelchair Mobility    Modified Rankin (Stroke Patients Only)       Balance Overall balance assessment: Needs assistance Sitting-balance support: No upper extremity supported;Feet supported Sitting balance-Leahy Scale: Fair     Standing balance support: Bilateral upper extremity supported;During functional activity Standing balance-Leahy Scale: Poor Standing balance comment: relies on UE support for balance                             Pertinent Vitals/Pain Pain Assessment: 0-10 Pain Score: 3  Pain Location: right foot Pain Descriptors / Indicators: Aching;Guarding;Grimacing Pain Intervention(s): Limited activity within patient's tolerance;Monitored during session;Repositioned    Home Living Family/patient expects to be discharged to:: Private residence Living Arrangements: Alone (dog) Available Help at Discharge: Family;Available PRN/intermittently (brother and neighbor) Type of Home: House Home Access: Ramped entrance     Home Layout: One level;Laundry or work area in basement (Public affairs consultant in basement, go outside and walk around house to get to basement, 12 steps) Home Equipment: Shower seat;Grab bars - tub/shower;Cane - single point  Prior Function Level of Independence: Independent         Comments: Drives, bathes and dresses self     Hand Dominance   Dominant Hand: Right     Extremity/Trunk Assessment   Upper Extremity Assessment Upper Extremity Assessment: Defer to OT evaluation    Lower Extremity Assessment Lower Extremity Assessment: Generalized weakness;RLE deficits/detail RLE: Unable to fully assess due to pain    Cervical / Trunk Assessment Cervical / Trunk Assessment: Kyphotic  Communication   Communication: No difficulties  Cognition Arousal/Alertness: Awake/alert Behavior During Therapy: WFL for tasks assessed/performed Overall Cognitive Status: Within Functional Limits for tasks assessed                                        General Comments      Exercises     Assessment/Plan    PT Assessment Patient needs continued PT services  PT Problem List Decreased balance;Decreased mobility;Decreased activity tolerance;Decreased strength;Decreased range of motion;Decreased knowledge of use of DME;Decreased safety awareness;Decreased knowledge of precautions;Cardiopulmonary status limiting activity;Pain;Decreased skin integrity;Obesity       PT Treatment Interventions DME instruction;Gait training;Functional mobility training;Therapeutic activities;Therapeutic exercise;Balance training;Patient/family education    PT Goals (Current goals can be found in the Care Plan section)  Acute Rehab PT Goals Patient Stated Goal: to go home PT Goal Formulation: With patient Time For Goal Achievement: 11/14/20 Potential to Achieve Goals: Good    Frequency Min 5X/week   Barriers to discharge Decreased caregiver support      Co-evaluation               AM-PAC PT "6 Clicks" Mobility  Outcome Measure Help needed turning from your back to your side while in a flat bed without using bedrails?: None Help needed moving from lying on your back to sitting on the side of a flat bed without using bedrails?: None Help needed moving to and from a bed to a chair (including a wheelchair)?: A Little Help needed standing up from a chair  using your arms (e.g., wheelchair or bedside chair)?: A Little Help needed to walk in hospital room?: A Little Help needed climbing 3-5 steps with a railing? : A Little 6 Click Score: 20    End of Session Equipment Utilized During Treatment: Gait belt Activity Tolerance: Patient tolerated treatment well Patient left: in chair;with call bell/phone within reach;with chair alarm set Nurse Communication: Mobility status PT Visit Diagnosis: Unsteadiness on feet (R26.81);Muscle weakness (generalized) (M62.81);Pain Pain - Right/Left: Right Pain - part of body: Ankle and joints of foot    Time: 1453-1530 PT Time Calculation (min) (ACUTE ONLY): 37 min   Charges:   PT Evaluation $PT Eval Moderate Complexity: 1 Mod PT Treatments $Gait Training: 8-22 mins        Arden Axon M,PT Acute Rehab Services [phone] (pager)   Bevelyn Buckles 10/31/2020, 4:49 PM

## 2020-10-31 NOTE — Progress Notes (Signed)
Progress Note    Mitchell [name]   TMH:[id number]  DOB: 26-Jun-1950  DOA: 10/25/2020     6 Date of Service: 10/31/2020   Clinical Course Mitchell [name] is a 70 year old male with T2DM on insulin, HTN, tobacco abuse, PVD, obesity, history of diabetic foot ulcers who presented to [facility] ER with several day complaint of right foot redness swelling and drainage.   His right foot has been an ongoing issue for years he stated on admission with a difficulty healing ulcer. With further work-up he was also found to have dark stools and his hemoglobin noted to be 6.1 g/dL. He was admitted for IV antibiotics, evaluation with vascular surgery and GI. 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  Surgery took place on 10/27/20 followed by wound VAC placement.  GI evaluation then took place and he underwent EGD on 10/28/2020.  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  Outpatient colonoscopy was also recommended once patient is discharged for further evaluation of his iron deficiency anemia.   Assessment and Plan * Foot osteomyelitis, right (HCC) - Diagnosed on MRI foot on admission - Underwent transmetatarsal amputation with vascular surgery on 10/27/2020 - Wound VAC in place with changes on Monday, Wednesday, Friday. -Has been on vancomycin, Rocephin, Flagyl since admission.  Still some calor noted in right lower leg on exam on 10/30/2020 but tenderness has improved.  Cultures reviewed.  At this time will de-escalate to Unasyn to continue coverage and discharge on Augmentin to complete course; PCN allergy noted, monitor for any rash/itching after starting Unasyn  -E faecalis and group B strep noted on wound cultures from admission  GI bleed - Hemoglobin nadir 6.1 g/dL.  Responded well to blood transfusion - Currently hemoglobin remaining stable.  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  Obesity - Complicates overall prognosis and care - Body mass index is 34.71 kg/m. - Weight Loss and Dietary Counseling given     Subjective:  No events overnight.  Underwent wound VAC change this morning.  He feels about the same.  Objective Vitals:   10/30/20 1645 10/30/20 2023 10/31/20 0544 10/31/20 0815  BP: (!) 132/44 136/63 (!) 116/56 (!) 124/48  Pulse: 65 79 85 82  Resp: 18 17 19 16   Temp: 98.3 F (36.8 C) 98.5 F (36.9 C) 98.3 F (36.8 C) 98.9 F (37.2 C)  TempSrc: Oral Oral Oral Oral  SpO2: 97% 95% 90% 90%  Weight:      Height:       112.9 kg  Vital signs were reviewed and unremarkable.   Exam Physical Exam Constitutional:      Appearance: Normal appearance. He is not ill-appearing.  HENT:     Head: Normocephalic and atraumatic.     Mouth/Throat:     Mouth: Mucous membranes are moist.  Eyes:     Extraocular Movements: Extraocular movements intact.  Cardiovascular:     Rate and Rhythm:  Normal rate and regular rhythm.  Pulmonary:     Effort: Pulmonary effort is normal.     Breath sounds: Normal breath sounds. No wheezing.  Abdominal:     General: Bowel sounds are normal. There is no distension.     Palpations: Abdomen is soft.     Tenderness: There is no abdominal tenderness.  Musculoskeletal:        General: Normal range of motion.     Cervical back: Normal range of motion and neck supple.     Comments: Right foot in dressing with WV in place   Skin:    General:  Skin is warm and dry.  Neurological:     General: No focal deficit present.     Mental Status: He is alert.  Psychiatric:        Mood and Affect: Mood normal.        Behavior: Behavior normal.     Labs / Other Information My review of labs, imaging, notes and other tests shows no new significant findings.    Disposition Plan: Status is: Inpatient  Remains inpatient appropriate because: Ongoing serial exams, wound VAC changes, IV abx    Time spent: Greater than 50% of the 35 minute visit was spent in counseling/coordination of care for the patient as laid out in the A&P.  Lewie Chamber, MD [facility] 10/31/2020, 2:01 PM

## 2020-10-31 NOTE — Progress Notes (Addendum)
[facility]  Subjective  - Pain is well controlled   Objective (!) 116/56 85 98.3 F (36.8 C) (Oral) 19 90%  Intake/Output Summary (Last 24 hours) at 10/31/2020 0749 Last data filed at 10/31/2020 2426 Gross per 24 hour  Intake 480 ml  Output 2150 ml  Net -1670 ml    Left foot TMA incision with dorsum skin edge changes    Granulation in wound bed Dry dressing placed  Palpable DP B LE Lungs non labored breathing  Assessment/Planning: S/P TMA 10/27/20 by Dr. Myra Gianotti Will allow for demarcation, he may require further debridement verses BKA in the future.  WOC to replace wound vac today. Will order PT/OT he has heel weight bearing darco shoe   Mosetta Pigeon 10/31/2020 7:49 AM --  Laboratory Lab Results: Recent Labs    10/30/20 0115 10/31/20 0206  WBC 10.9* 8.1  HGB 8.5* 8.7*  HCT 32.7* 33.1*  PLT 349 351   BMET Recent Labs    10/30/20 0115 10/31/20 0206  NA 132* 134*  K 4.2 5.1  CL 100 101  CO2 24 26  GLUCOSE 116* 109*  BUN 16 18  CREATININE 0.99 1.13  CALCIUM 7.8* 8.3*    COAG Lab Results  Component Value Date   INR 1.2 10/25/2020   No results found for: PTT  I have seen and evaluated the patient. I agree with the PA note as documented above.  VAC change today and TMA seems to be making good progress.  Skin edges are little discolored but overall the wound is granulating and looks good.  Cephus Shelling, MD [facility] Office: [phone]

## 2020-10-31 NOTE — Consult Note (Addendum)
WOC Nurse wound follow up Patient receiving care in [facility] 6N23 Wound type: Surgical amputation of the right toes. Sutures in place on the anterior side. Wound bed: Granulation in the wound bed, bleeding when dressing removed.  Drainage (amount, consistency, odor) Bloody in canister.  Periwound: Maceration surrounding the wound Dressing procedure/placement/frequency: Dry gauze dressing placed by PA this AM. NPWT started back up. Placed one piece of black foam in the wound. Placed 2 barrier rings around the entire wound after cleaning with 40M skin barrier wipes. Bridged to the anterior side of the foot and ankle. Drape applied, seal obtained  at 125 mmHg after placing more reinforcement with drape.  WOC will follow M/W/F. More supplies ordered to be placed in the patient room.   Renaldo Reel Katrinka Blazing, MSN, RN, CMSRN, Angus Seller, [facility] Wound Treatment Associate Pager [phone]

## 2020-11-01 DIAGNOSIS — M868X7 Other osteomyelitis, ankle and foot: Secondary | ICD-10-CM | POA: Diagnosis not present

## 2020-11-01 LAB — CBC WITH DIFFERENTIAL/PLATELET
Abs Immature Granulocytes: 0.04 10*3/uL (ref 0.00–0.07)
Basophils Absolute: 0 10*3/uL (ref 0.0–0.1)
Basophils Relative: 0 %
Eosinophils Absolute: 0.5 10*3/uL (ref 0.0–0.5)
Eosinophils Relative: 6 %
HCT: 33.5 % — ABNORMAL LOW (ref 39.0–52.0)
Hemoglobin: 8.8 g/dL — ABNORMAL LOW (ref 13.0–17.0)
Immature Granulocytes: 1 %
Lymphocytes Relative: 13 %
Lymphs Abs: 1.1 10*3/uL (ref 0.7–4.0)
MCH: 18.9 pg — ABNORMAL LOW (ref 26.0–34.0)
MCHC: 26.3 g/dL — ABNORMAL LOW (ref 30.0–36.0)
MCV: 71.9 fL — ABNORMAL LOW (ref 80.0–100.0)
Monocytes Absolute: 0.8 10*3/uL (ref 0.1–1.0)
Monocytes Relative: 9 %
Neutro Abs: 6 10*3/uL (ref 1.7–7.7)
Neutrophils Relative %: 71 %
Platelets: 360 10*3/uL (ref 150–400)
RBC: 4.66 MIL/uL (ref 4.22–5.81)
RDW: 32.2 % — ABNORMAL HIGH (ref 11.5–15.5)
WBC: 8.4 10*3/uL (ref 4.0–10.5)
nRBC: 0 % (ref 0.0–0.2)

## 2020-11-01 LAB — GLUCOSE, CAPILLARY
Glucose-Capillary: 103 mg/dL — ABNORMAL HIGH (ref 70–99)
Glucose-Capillary: 147 mg/dL — ABNORMAL HIGH (ref 70–99)
Glucose-Capillary: 109 mg/dL — ABNORMAL HIGH (ref 70–99)
Glucose-Capillary: 131 mg/dL — ABNORMAL HIGH (ref 70–99)

## 2020-11-01 LAB — BASIC METABOLIC PANEL
Anion gap: 8 (ref 5–15)
BUN: 15 mg/dL (ref 8–23)
CO2: 25 mmol/L (ref 22–32)
Calcium: 8.1 mg/dL — ABNORMAL LOW (ref 8.9–10.3)
Chloride: 101 mmol/L (ref 98–111)
Creatinine, Ser: 0.82 mg/dL (ref 0.61–1.24)
GFR, Estimated: >60 mL/min (ref >60)
Glucose, Bld: 117 mg/dL — ABNORMAL HIGH (ref 70–99)
Potassium: 4.3 mmol/L (ref 3.5–5.1)
Sodium: 134 mmol/L — ABNORMAL LOW (ref 135–145)

## 2020-11-01 LAB — MAGNESIUM: Magnesium: 2.2 mg/dL (ref 1.7–2.4)

## 2020-11-01 NOTE — Care Management (Signed)
    Durable Medical Equipment  (From admission, onward)           Start     Ordered   11/01/20 1337  For home use only DME 3 n 1  Once        11/01/20 1336   11/01/20 1335  For home use only DME lightweight manual wheelchair with seat cushion  Once       Comments: Patient suffers from Right transmetatarsal amputation Placement of wound VAC (14 x 1.5 x 1 cm) which impairs their ability to perform daily activities like ambulating  in the home.  A cane  will not resolve  issue with performing activities of daily living. A wheelchair will allow patient to safely perform daily activities. Patient is not able to propel themselves in the home using a standard weight wheelchair due to Right transmetatarsal amputation, Placement of wound VAC (14 x 1.5 x 1 cm). Patient can self propel in the lightweight wheelchair. Length of need lifetime . Accessories: elevating leg rests (ELRs), wheel locks, extensions and anti-tippers.   Wheelchair Occupational psychologist with desk armrests, elevating legrests and antitippers);Wheelchair cushion (18x16 pressure relieving cushion)   BAck cushion   11/01/20 1336   11/01/20 0718  For home use only DME Walker rolling  Once       Question Answer Comment  Walker: With 5 Inch Wheels   Patient needs a walker to treat with the following condition Osteomyelitis [facility])   Patient needs a walker to treat with the following condition Generalized muscle weakness      11/01/20 0718

## 2020-11-01 NOTE — Progress Notes (Signed)
Physical Therapy Treatment Patient Details Name: Mataio Mele MRN: [id number] DOB: 1950-12-27 Today's Date: 11/01/2020   History of Present Illness Mr. Westhoff is a 70 year old male admitted 10/11 who presented to [facility] ER with several day complaint of right foot redness swelling and drainage. 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  Surgery took place on 10/27/20 followed by wound VAC placement. PMH:  T2DM on insulin, HTN, tobacco abuse, PVD, obesity, history of diabetic foot ulcers    PT Comments    Pt admitted with above diagnosis. Pt needs cues for safety as he is impulsive at times. Pt overall does well with RW but does demonstrate decr stability at times even with RW but did self correct today. Given that he lives alone and has difficulty with ADLs with OT, recommend SNF to gain strength and endurance prior to d/c home alone. Pt states he prefers home therefore recommendations for equipment for home below as well.  Pt currently with functional limitations due to balance and endurance deficits. Pt will benefit from skilled PT to increase their independence and safety with mobility to allow discharge to the venue listed below.      Recommendations for follow up therapy are one component of a multi-disciplinary discharge planning process, led by the attending physician.  Recommendations may be updated based on patient status, additional functional criteria and insurance authorization.  Follow Up Recommendations  SNF (SNF recommended as pt with decr safety at times and has difficulty with ADLS on his own  but pt refusing therefore HHPT, HHOT, HHaide)     Equipment Recommendations  Rolling walker with 5" wheels;Wheelchair Occupational psychologist with desk armrests, elevating legrests and antitippers);Wheelchair cushion (18x16 pressure relieving cushion)    Recommendations for Other Services       Precautions / Restrictions  Precautions Precautions: Fall Precaution Comments: Wound VAC Required Braces or Orthoses: Other Brace Other Brace: Darco shoe Restrictions Weight Bearing Restrictions: Yes RLE Weight Bearing: Partial weight bearing Other Position/Activity Restrictions: heel weight bearing on Darco shoe     Mobility  Bed Mobility Overal bed mobility: Needs Assistance Bed Mobility: Supine to Sit     Supine to sit: Supervision          Transfers Overall transfer level: Needs assistance Equipment used: Rolling walker (2 wheeled) Transfers: Sit to/from Stand Sit to Stand: Min guard         General transfer comment: cues for hand placement  Ambulation/Gait Ambulation/Gait assistance: Min guard;Min assist Gait Distance (Feet): 125 Feet Assistive device: Rolling walker (2 wheeled) Gait Pattern/deviations: Step-to pattern;Decreased stance time - right;Decreased weight shift to right;Trunk flexed;Antalgic   Gait velocity interpretation: <1.31 ft/sec, indicative of household ambulator General Gait Details: Pt ambulated to hallway and back with RW wtih overall good stability.  When asked pt to look up, he had a slight LOB but self corrected. Overall does well with the RW. Pt also impulsive at times.   Stairs             Wheelchair Mobility    Modified Rankin (Stroke Patients Only)       Balance Overall balance assessment: Needs assistance Sitting-balance support: No upper extremity supported;Feet supported Sitting balance-Leahy Scale: Fair     Standing balance support: Bilateral upper extremity supported;During functional activity Standing balance-Leahy Scale: Poor Standing balance comment: relies on UE support for balance  Cognition Arousal/Alertness: Awake/alert Behavior During Therapy: WFL for tasks assessed/performed Overall Cognitive Status: Within Functional Limits for tasks assessed                                         Exercises      General Comments General comments (skin integrity, edema, etc.): VaC in place      Pertinent Vitals/Pain Pain Assessment: Faces Faces Pain Scale: Hurts a little bit Pain Location: right foot and R arm Pain Descriptors / Indicators: Aching;Guarding;Grimacing Pain Intervention(s): Limited activity within patient's tolerance;Monitored during session;Repositioned    Home Living Family/patient expects to be discharged to:: Private residence Living Arrangements: Alone Available Help at Discharge: Family;Available PRN/intermittently Type of Home: House Home Access: Ramped entrance   Home Layout: One level;Laundry or work area in [employer] Equipment: Shower seat;Grab bars - tub/shower;Cane - single point      Prior Function Level of Independence: Independent      Comments: Drives, bathes and dresses self   PT Goals (current goals can now be found in the care plan section) Acute Rehab PT Goals Patient Stated Goal: to go home Progress towards PT goals: Progressing toward goals    Frequency    Min 5X/week      PT Plan Discharge plan needs to be updated    Co-evaluation              AM-PAC PT "6 Clicks" Mobility   Outcome Measure  Help needed turning from your back to your side while in a flat bed without using bedrails?: None Help needed moving from lying on your back to sitting on the side of a flat bed without using bedrails?: A Little Help needed moving to and from a bed to a chair (including a wheelchair)?: A Little Help needed standing up from a chair using your arms (e.g., wheelchair or bedside chair)?: A Little Help needed to walk in hospital room?: A Little Help needed climbing 3-5 steps with a railing? : A Lot 6 Click Score: 18    End of Session Equipment Utilized During Treatment: Gait belt Activity Tolerance: Patient tolerated treatment well Patient left: in chair;with call bell/phone within reach;with chair alarm  set Nurse Communication: Mobility status PT Visit Diagnosis: Unsteadiness on feet (R26.81);Muscle weakness (generalized) (M62.81);Pain Pain - Right/Left: Right Pain - part of body: Ankle and joints of foot     Time: 1043-1103 PT Time Calculation (min) (ACUTE ONLY): 20 min  Charges:  $Gait Training: 8-22 mins                     Karizma Cheek M,PT Acute Rehab Services [phone] (pager)    Bevelyn Buckles 11/01/2020, 12:55 PM

## 2020-11-01 NOTE — Progress Notes (Addendum)
  Progress Note    11/01/2020 8:25 AM 4 Days Post-Op  Subjective:  No complaints. Sitting up in bed eating breakfast   Vitals:   11/01/20 0346 11/01/20 0754  BP: 132/69 (!) 151/58  Pulse: 90 88  Resp: 16 17  Temp: 98.6 F (37 C) 98.3 F (36.8 C)  SpO2: 93% 96%    Physical Exam: General appearance: Awake, alert in no apparent distress Cardiac: Heart rate and rhythm are regular Respirations: Nonlabored Extremities: Amputation site with VAC dressing place with good seal and clear bloody drainage.    CBC    Component Value Date/Time   WBC 8.4 11/01/2020 0150   RBC 4.66 11/01/2020 0150   HGB 8.8 (L) 11/01/2020 0150   HCT 33.5 (L) 11/01/2020 0150   PLT 360 11/01/2020 0150   MCV 71.9 (L) 11/01/2020 0150   MCH 18.9 (L) 11/01/2020 0150   MCHC 26.3 (L) 11/01/2020 0150   RDW 32.2 (H) 11/01/2020 0150   LYMPHSABS 1.1 11/01/2020 0150   MONOABS 0.8 11/01/2020 0150   EOSABS 0.5 11/01/2020 0150   BASOSABS 0.0 11/01/2020 0150    BMET    Component Value Date/Time   NA 134 (L) 11/01/2020 0150   K 4.3 11/01/2020 0150   CL 101 11/01/2020 0150   CO2 25 11/01/2020 0150   GLUCOSE 117 (H) 11/01/2020 0150   BUN 15 11/01/2020 0150   CREATININE 0.82 11/01/2020 0150   CALCIUM 8.1 (L) 11/01/2020 0150   GFRNONAA >60 11/01/2020 0150   GFRAA >60 08/29/2014 1627     Intake/Output Summary (Last 24 hours) at 11/01/2020 0825 Last data filed at 11/01/2020 0521 Gross per 24 hour  Intake 880 ml  Output 2030 ml  Net -1150 ml    HOSPITAL MEDICATIONS Scheduled Meds:  empagliflozin  25 mg Oral Daily   ferrous sulfate  325 mg Oral Q breakfast   gabapentin  300 mg Oral TID   insulin aspart  0-5 Units Subcutaneous QHS   insulin aspart  0-9 Units Subcutaneous TID WC   insulin aspart  5 Units Subcutaneous TID WC   insulin glargine-yfgn  15 Units Subcutaneous Daily   nicotine  21 mg Transdermal Daily   polyethylene glycol  17 g Oral Daily   pravastatin  20 mg Oral q1800    senna-docusate  1 tablet Oral Daily   Continuous Infusions:  ampicillin-sulbactam (UNASYN) IV 3 g (11/01/20 0514)   PRN Meds:.acetaminophen **OR** acetaminophen, HYDROmorphone (DILAUDID) injection, melatonin, ondansetron **OR** ondansetron (ZOFRAN) IV, oxyCODONE-acetaminophen  Assessment and Plan: POD 4 right TMA. VSS. Afebrile. On Unasyn. Normal WBC count. Change VAC tomorrow. Inspect dorsal skin edges for healing. Dispo: home with HHPT   Wendi Maya, PA-C [facility] [phone] 11/01/2020  8:25 AM   I agree with the above.  I have seen and evaluated the patient.  He is postoperative day #4 status post right transmetatarsal amputation.  Cultures are growing out strep.  He is on ampicillin.  He still has some cellulitis on the anterior aspect of his leg and so I would continue antibiotics until this has resolved, for at least 2 weeks.  He is scheduled for wound VAC change tomorrow.  Durene Cal

## 2020-11-01 NOTE — Progress Notes (Signed)
Progress Note    Mitchell [name]   WCB:[id number]  DOB: [birth year]  DOA: 10/25/2020     7 Date of Service: 11/01/2020   Clinical Course Mitchell [name] is a 70 year old male with T2DM on insulin, HTN, tobacco abuse, PVD, obesity, history of diabetic foot ulcers who presented to [facility] ER with several day complaint of right foot redness swelling and drainage.   His right foot has been an ongoing issue for years he stated on admission with a difficulty healing ulcer. With further work-up he was also found to have dark stools and his hemoglobin noted to be 6.1 g/dL. He was admitted for IV antibiotics, evaluation with vascular surgery and GI. 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  Surgery took place on 10/27/20 followed by wound VAC placement.  GI evaluation then took place and he underwent EGD on 10/28/2020.  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  Outpatient colonoscopy was also recommended once patient is discharged for further evaluation of his iron deficiency anemia.   Assessment and Plan * Foot osteomyelitis, right (HCC) - Diagnosed on MRI foot on admission - Underwent transmetatarsal amputation with vascular surgery on 10/27/2020 - Wound VAC in place with changes on Monday, Wednesday, Friday. -Has been on vancomycin, Rocephin, Flagyl since admission.  Still some calor noted in right lower leg on exam on 10/30/2020 but tenderness has improved.  Cultures reviewed.  At this time will de-escalate to Unasyn to continue coverage and discharge on Augmentin to complete course; PCN allergy noted, monitor for any rash/itching after starting Unasyn  -E faecalis and group B strep noted on wound cultures from admission - will await further rec's from vascular surgery on when patient able to discharge home   GI bleed - Hemoglobin nadir 6.1 g/dL.  Responded well to blood transfusion -  Currently hemoglobin remaining stable.  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  Obesity - Complicates overall prognosis and care - Body mass index is 34.71 kg/m. - Weight Loss and Dietary Counseling given     Subjective:  No events overnight. Ambulated some with therapy yesterday. Pain tolerable. Still feeling okay in general. He is wanting home health at time of discharge for help at home.   Objective Vitals:   10/31/20 1500 10/31/20 2051 11/01/20 0346 11/01/20 0754  BP: (!) 140/52 (!) 157/56 132/69 (!) 151/58  Pulse: 78 78 90 88  Resp: 18 19 16 17   Temp: 99.3 F (37.4 C) 98.3 F (36.8 C) 98.6 F (37 C) 98.3 F (36.8 C)  TempSrc: Oral Oral Oral Oral  SpO2: 96% 96% 93% 96%  Weight:      Height:       112.9 kg  Vital signs were reviewed and unremarkable.   Exam Physical Exam Constitutional:      Appearance: Normal appearance.  HENT:     Head: Normocephalic and atraumatic.     Mouth/Throat:  Mouth: Mucous membranes are moist.  Eyes:     Extraocular Movements: Extraocular movements intact.  Cardiovascular:     Rate and Rhythm: Normal rate and regular rhythm.  Pulmonary:     Effort: Pulmonary effort is normal.     Breath sounds: Normal breath sounds. No wheezing.  Abdominal:     General: Bowel sounds are normal. There is no distension.     Palpations: Abdomen is soft.     Tenderness: There is no abdominal tenderness.  Musculoskeletal:     Cervical back: Normal range of motion  and neck supple.     Comments: R foot noted with WV in place good seal, bloody drainage in tubing; still 2+ RLE edema and some erythema on ankle and lower leg. No TTP  Skin:    General: Skin is warm and dry.  Neurological:     General: No focal deficit present.     Mental Status: He is alert.  Psychiatric:        Mood and Affect: Mood normal.        Behavior: Behavior normal.     Labs / Other Information My review of labs, imaging, notes and other tests shows no new significant findings.    Disposition Plan: Status is: Inpatient  Remains inpatient appropriate because: further assessment of amputation     Time spent: Greater than 50% of the 35 minute visit was spent in counseling/coordination of care for the patient as laid out in the A&P.  Lewie Chamber, MD [facility] 11/01/2020, 2:11 PM

## 2020-11-01 NOTE — NC FL2 (Signed)
[place] MEDICAID FL2 LEVEL OF CARE SCREENING TOOL     IDENTIFICATION  Patient Name: Mitchell [name] Birthdate: [birth year] Sex: male Admission Date (Current Location): 10/25/2020  [facility] and [place] Number:  Producer, television/film/video and Address:  [facility], [address] [place], [place] [address]      Provider Number: [id number]  Attending Physician Name and Address:  Lewie Chamber, MD  Relative Name and Phone Number:       Current Level of Care: Hospital Recommended Level of Care: Skilled Nursing Facility Prior Approval Number:    Date Approved/Denied:   PASRR Number: [id number]  Discharge Plan: SNF    Current Diagnoses: Patient Active Problem List   Diagnosis Date Noted   Foot osteomyelitis, right (HCC) 10/25/2020   GI bleed 10/25/2020   Blood loss anemia 10/25/2020   Osteomyelitis of right foot (HCC) 10/25/2020   Closed fracture of proximal end of right humerus with routine healing 02/25/2019   Pre-ulcerative calluses 04/20/2018   Myositis 07/08/2014   Diabetic foot ulcer associated with type 2 diabetes mellitus (HCC) 07/07/2014   Tobacco abuse 07/07/2014   Diabetic ulcer of right foot (HCC) 05/12/2014   Pulse weakness    Peripheral neuropathy    Cellulitis of right lower extremity 05/09/2014   Obesity 05/09/2014   Cellulitis of right lower leg 05/09/2014   Hyperglycemia 03/21/2013   Type 2 diabetes mellitus with foot ulcer (HCC) 03/21/2013   Noncompliance with medications due to cost issues 03/21/2013    Orientation RESPIRATION BLADDER Height & Weight     Self, Time, Situation  Normal Continent Weight: 248 lb 14.4 oz (112.9 kg) Height:  5\' 11"  (180.3 cm)  BEHAVIORAL SYMPTOMS/MOOD NEUROLOGICAL BOWEL NUTRITION STATUS      Continent Diet (See DC summary)  AMBULATORY STATUS COMMUNICATION OF NEEDS Skin   Limited Assist Verbally Normal                       Personal Care Assistance Level of Assistance  Bathing, Feeding,  Dressing Bathing Assistance: Limited assistance Feeding assistance: Independent Dressing Assistance: Limited assistance     Functional Limitations Info  Sight, Hearing, Speech Sight Info: Adequate Hearing Info: Adequate Speech Info: Adequate    SPECIAL CARE FACTORS FREQUENCY  PT (By licensed PT), OT (By licensed OT)     PT Frequency: 5x a week OT Frequency: 5x a week            Contractures Contractures Info: Not present    Additional Factors Info  Code Status, Allergies Code Status Info: Full Allergies Info: NKA           Current Medications (11/01/2020):  This is the current hospital active medication list Current Facility-Administered Medications  Medication Dose Route Frequency Provider Last Rate Last Admin   acetaminophen (TYLENOL) tablet 650 mg  650 mg Oral Q6H PRN 11/03/2020 M, PA-C       Or   acetaminophen (TYLENOL) suppository 650 mg  650 mg Rectal Q6H PRN M M, PA-C       Ampicillin-Sulbactam (UNASYN) 3 g in sodium chloride 0.9 % 100 mL IVPB  3 g Intravenous M, MD 200 mL/hr at 11/01/20 1230 3 g at 11/01/20 1230   empagliflozin (JARDIANCE) tablet 25 mg  25 mg Oral Daily 11/03/20 M, PA-C   25 mg at 11/01/20 11/03/20   ferrous sulfate tablet 325 mg  325 mg Oral Q breakfast 7510, MD  325 mg at 11/01/20 0825   gabapentin (NEURONTIN) capsule 300 mg  300 mg Oral TID Lars Mage, PA-C   300 mg at 11/01/20 5638   HYDROmorphone (DILAUDID) injection 0.5 mg  0.5 mg Intravenous Daily PRN Cephus Shelling, MD   0.5 mg at 10/31/20 0756   insulin aspart (novoLOG) injection 0-5 Units  0-5 Units Subcutaneous QHS Clinton Gallant M, PA-C       insulin aspart (novoLOG) injection 0-9 Units  0-9 Units Subcutaneous TID WC Lars Mage, PA-C   2 Units at 11/01/20 1230   insulin aspart (novoLOG) injection 5 Units  5 Units Subcutaneous TID WC Lars Mage, PA-C   5 Units at 11/01/20 1230   insulin glargine-yfgn [facility]) injection 15  Units  15 Units Subcutaneous Daily Lars Mage, [place]   15 Units at 11/01/20 7564   melatonin tablet 6 mg  6 mg Oral QHS PRN Lars Mage, PA-C   6 mg at 10/28/20 2041   nicotine (NICODERM CQ - dosed in mg/24 hours) patch 21 mg  21 mg Transdermal Daily Clinton Gallant M, PA-C   21 mg at 11/01/20 0954   ondansetron (ZOFRAN) tablet 4 mg  4 mg Oral Q6H PRN Clinton Gallant M, PA-C       Or   ondansetron [facility]) injection 4 mg  4 mg Intravenous Q6H PRN Clinton Gallant M, PA-C       oxyCODONE-acetaminophen (PERCOCET/ROXICET) 5-325 MG per tablet 1-2 tablet  1-2 tablet Oral Q4H PRN Cephus Shelling, MD   1 tablet at 10/31/20 1515   polyethylene glycol (MIRALAX / GLYCOLAX) packet 17 g  17 g Oral Daily Lewie Chamber, MD   17 g at 10/29/20 1214   pravastatin (PRAVACHOL) tablet 20 mg  20 mg Oral q1800 Clinton Gallant M, PA-C   20 mg at 10/31/20 1756   senna-docusate (Senokot-S) tablet 1 tablet  1 tablet Oral Daily Lewie Chamber, MD   1 tablet at 11/01/20 3329     Discharge Medications: Please see discharge summary for a list of discharge medications.  Relevant Imaging Results:  Relevant Lab Results:   Additional Information SSN [id number]  Jimmy Picket, LCSW

## 2020-11-01 NOTE — Evaluation (Signed)
Occupational Therapy Evaluation Patient Details Name: Mitchell [name] MRN: [id number] DOB: 18-Aug-1950 Today's Date: 11/01/2020   History of Present Illness Mitchell [name] is a 70 year old male admitted 10/11 who presented to [facility] ER with several day complaint of right foot redness swelling and drainage. 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  Surgery took place on 10/27/20 followed by wound VAC placement. PMH:  T2DM on insulin, HTN, tobacco abuse, PVD, obesity, history of diabetic foot ulcers   Clinical Impression   Pt reported they live alone but they have a brother who comes by to help but they have decrease in STM and their son can only can come 1-2 times a week. Pt in session required min to moderate assist with Le ADLs and min assist with hygiene post BM. Pt was recommended in session abut have a short SNF stay then [facility] services but requested at this time [facility].Pt currently with functional limitations due to the deficits listed below (see OT Problem List).  Pt will benefit from skilled OT to increase their safety and independence with ADL and functional mobility for ADL to facilitate discharge to venue listed below.        Recommendations for follow up therapy are one component of a multi-disciplinary discharge planning process, led by the attending physician.  Recommendations may be updated based on patient status, additional functional criteria and insurance authorization.   Follow Up Recommendations  SNF;Home health OT;Supervision/Assistance - 24 hour (Pt was recomended on SNF level but pt is reporting they want to go home with therapies. Pt reported in session about how the brother has decrease STM)    Equipment Recommendations  3 in 1 bedside commode    Recommendations for Other Services       Precautions / Restrictions Precautions Precautions: Fall Precaution Comments: Wound VAC Required Braces or Orthoses: Other  Brace Other Brace: Darco shoe Restrictions Weight Bearing Restrictions: Yes RLE Weight Bearing: Partial weight bearing Other Position/Activity Restrictions: heel weight bearing on Darco shoe      Mobility Bed Mobility Overal bed mobility: Needs Assistance Bed Mobility: Supine to Sit     Supine to sit: Supervision          Transfers Overall transfer level: Needs assistance Equipment used: Rolling walker (2 wheeled) Transfers: Sit to/from Stand Sit to Stand: Min guard         General transfer comment: cues for hand placement    Balance Overall balance assessment: Needs assistance Sitting-balance support: No upper extremity supported;Feet supported Sitting balance-Leahy Scale: Fair     Standing balance support: Bilateral upper extremity supported;During functional activity Standing balance-Leahy Scale: Poor Standing balance comment: relies on UE support for balance, was able only to shift waight for hygiene but very limited                           ADL either performed or assessed with clinical judgement   ADL Overall ADL's : Needs assistance/impaired Eating/Feeding: Set up;Sitting   Grooming: Wash/dry hands;Wash/dry face;Set up;Sitting   Upper Body Bathing: Set up;Cueing for safety;Cueing for sequencing;Sitting   Lower Body Bathing: Minimal assistance;Cueing for safety;Cueing for sequencing;Sit to/from stand   Upper Body Dressing : Set up;Cueing for safety;Cueing for sequencing;Sitting   Lower Body Dressing: Moderate assistance;Cueing for safety;Cueing for sequencing;Sit to/from stand   Toilet Transfer: Minimal assistance;Cueing for safety;Cueing for sequencing   Toileting- Clothing Manipulation and Hygiene: Minimal assistance;Cueing for safety;Cueing for  sequencing;Sit to/from stand       Functional mobility during ADLs: Min guard;Cueing for safety;Cueing for sequencing;Rolling walker       Vision         Perception     Praxis       Pertinent Vitals/Pain Pain Assessment: Faces Faces Pain Scale: Hurts a little bit Pain Location: right foot and R arm Pain Descriptors / Indicators: Aching;Guarding;Grimacing Pain Intervention(s): Limited activity within patient's tolerance     Hand Dominance Right   Extremity/Trunk Assessment Upper Extremity Assessment Upper Extremity Assessment: RUE deficits/detail RUE Deficits / Details: pain in R arm RUE: Unable to fully assess due to pain RUE Sensation: WNL RUE Coordination: WNL   Lower Extremity Assessment Lower Extremity Assessment: Defer to PT evaluation RLE: Unable to fully assess due to pain   Cervical / Trunk Assessment Cervical / Trunk Assessment: Kyphotic   Communication Communication Communication: No difficulties   Cognition Arousal/Alertness: Awake/alert Behavior During Therapy: WFL for tasks assessed/performed Overall Cognitive Status: Within Functional Limits for tasks assessed                                     General Comments       Exercises     Shoulder Instructions      Home Living Family/patient expects to be discharged to:: Private residence Living Arrangements: Alone Available Help at Discharge: Family;Available PRN/intermittently Type of Home: House Home Access: Ramped entrance     Home Layout: One level;Laundry or work area in basement     [employer]: Producer, television/film/video: Standard     Home Equipment: Shower seat;Grab bars - tub/shower;Cane - single point          Prior Functioning/Environment Level of Independence: Independent        Comments: Drives, bathes and dresses self        OT Problem List: Decreased strength;Decreased range of motion;Decreased activity tolerance;Impaired balance (sitting and/or standing);Decreased safety awareness;Decreased knowledge of use of DME or AE;Cardiopulmonary status limiting activity;Pain      OT Treatment/Interventions: Self-care/ADL  training;Therapeutic exercise;DME and/or AE instruction;Therapeutic activities;Patient/family education;Balance training    OT Goals(Current goals can be found in the care plan section) Acute Rehab OT Goals Patient Stated Goal: to go home OT Goal Formulation: With patient Time For Goal Achievement: 11/12/20 Potential to Achieve Goals: Good ADL Goals Pt Will Perform Lower Body Bathing: with modified independence;sit to/from stand Pt Will Perform Lower Body Dressing: with modified independence;sit to/from stand Pt Will Perform Tub/Shower Transfer: with modified independence;ambulating;shower seat;grab bars;rolling walker  OT Frequency: Min 2X/week   Barriers to D/C:            Co-evaluation              AM-PAC OT "6 Clicks" Daily Activity     Outcome Measure Help from another person eating meals?: None Help from another person taking care of personal grooming?: A Little Help from another person toileting, which includes using toliet, bedpan, or urinal?: A Little Help from another person bathing (including washing, rinsing, drying)?: A Lot Help from another person to put on and taking off regular upper body clothing?: A Little Help from another person to put on and taking off regular lower body clothing?: A Lot 6 Click Score: 17   End of Session Equipment Utilized During Treatment: Gait belt;Rolling walker Nurse Communication: Mobility status  Activity Tolerance: Patient tolerated  treatment well Patient left: in chair;with call bell/phone within reach;with chair alarm set  OT Visit Diagnosis: Unsteadiness on feet (R26.81);Other abnormalities of gait and mobility (R26.89);Muscle weakness (generalized) (M62.81);Pain Pain - Right/Left: Right Pain - part of body: Leg;Ankle and joints of foot                Time: 5093-2671 OT Time Calculation (min): 52 min Charges:  OT General Charges $OT Visit: 1 Visit OT Evaluation $OT Eval Low Complexity: 1 Low OT Treatments $Self  Care/Home Management : 23-37 mins  Alphia Moh OTR/L  Acute Rehab Services  [phone] office number [phone] pager number   Alphia Moh 11/01/2020, 12:25 PM

## 2020-11-01 NOTE — Care Management Important Message (Signed)
Important Message  Patient Details  Name: Mitchell [name] MRN: [id number] Date of Birth: [birth year]   Medicare Important Message Given:  Yes     Gian Ybarra 11/01/2020, 12:16 PM

## 2020-11-01 NOTE — TOC Initial Note (Addendum)
Transition of Care [facility]) - Initial/Assessment Note    Patient Details  Name: Mitchell [name] MRN: [id number] Date of Birth: 1950/08/23  Transition of Care [facility]) CM/SW Contact:    Kingsley Plan, RN Phone Number: 11/01/2020, 9:49 AM  Clinical Narrative:                 Spoke to patient at bedside. Confirmed face sheet information . Patient from home by himself. His brother lives across the street. His son lives in [place].   Discussed home VAC. NCM will order VAC through [employer]. [employer] will deliver home [facility] and a box of supplies to patient's hospital room prior to discharge. [employer] will discuss directly with the patient at that time, insurance coverage and if he is responsible for any part. Home VAC machine is much smaller then hospital pump. On day of discharge hospital nurse will disconnect [facility] and connect home VAC. Patient will take box of supplies home with him for [facility]. He will have HHRN to change VAC Monday , Wednesday and Friday .   Patient has no preference on home health agency.   Patient will also need walker . Walker ordered through [employer]   Patient has no preference on home health agency.   French Ana with [employer] has already submitted for [facility] and will have VAC delivered to hospital room.   Home health agencies asking when discharge will be so they can plan for start of care. NCM secured chatted Setzer PA  Marylene Land with [facility] reviewing referral for [facility] and HHPT    1330 PT recommending SNF now. Patient refusing . NCM discussed recommendation for SNF or 24 hour supervision at home. Patient lives alone, brother across the street. Patient voices understanding and states "I'll make it work". Added HHOT and aide to [facility] and HHPT. Marylene Land with [facility] aware and can provide services. Ordered wheel chair and 3 in 1 and walker with Velna Hatchet with [employer]    Expected Discharge Plan: Home w Home Health Services Barriers to Discharge: Continued Medical Work up   Patient Goals  and CMS Choice Patient states their goals for this hospitalization and ongoing recovery are:: to return to home CMS Medicare.gov Compare Post Acute Care list provided to:: Patient Choice offered to / list presented to : Patient  Expected Discharge Plan and Services Expected Discharge Plan: Home w Home Health Services   Discharge Planning Services: CM Consult Post Acute Care Choice: Home Health, Durable Medical Equipment Living arrangements for the past 2 months: Single Family Home                 DME Arranged: Negative pressure wound device, Walker rolling DME Agency: Chad Cordial Date DME Agency Contacted: 11/01/20 Time DME Agency Contacted: [phone] Representative spoke with at DME Agency: Margretta Ditty for walker, French Ana with [employer] for [facility] HH Arranged: PT, RN          Prior Living Arrangements/Services Living arrangements for the past 2 months: Single Family Home Lives with:: Self Patient language and need for interpreter reviewed:: Yes        Need for Family Participation in Patient Care: Yes (Comment) Care giver support system in place?: Yes (comment) Current home services: DME Criminal Activity/Legal Involvement Pertinent to Current Situation/Hospitalization: No - Comment as needed  Activities of Daily Living Home Assistive Devices/Equipment: None ADL Screening (condition at time of admission) Patient's cognitive ability adequate to safely complete daily activities?: Yes Is the patient deaf or have difficulty hearing?: No Does the  patient have difficulty seeing, even when wearing glasses/contacts?: No Does the patient have difficulty concentrating, remembering, or making decisions?: No Patient able to express need for assistance with ADLs?: Yes Does the patient have difficulty dressing or bathing?: No Independently performs ADLs?: Yes (appropriate for developmental age) Does the patient have difficulty walking or climbing stairs?: Yes Weakness of Legs: Both Weakness of  Arms/Hands: None  Permission Sought/Granted   Permission granted to share information with : No              Emotional Assessment Appearance:: Appears stated age Attitude/Demeanor/Rapport: Engaged Affect (typically observed): Accepting Orientation: : Oriented to Self, Oriented to Place, Oriented to  Time, Oriented to Situation Alcohol / Substance Use: Not Applicable Psych Involvement: No (comment)  Admission diagnosis:  Infection [B99.9] PVD (peripheral vascular disease) (HCC) [I73.9] Upper GI bleed [K92.2] Osteomyelitis of right foot (HCC) [M86.9] Other acute osteomyelitis of right foot (HCC) [M86.171] Patient Active Problem List   Diagnosis Date Noted   Foot osteomyelitis, right (HCC) 10/25/2020   GI bleed 10/25/2020   Blood loss anemia 10/25/2020   Osteomyelitis of right foot (HCC) 10/25/2020   Closed fracture of proximal end of right humerus with routine healing 02/25/2019   Pre-ulcerative calluses 04/20/2018   Myositis 07/08/2014   Diabetic foot ulcer associated with type 2 diabetes mellitus (HCC) 07/07/2014   Tobacco abuse 07/07/2014   Diabetic ulcer of right foot (HCC) 05/12/2014   Pulse weakness    Peripheral neuropathy    Cellulitis of right lower extremity 05/09/2014   Obesity 05/09/2014   Cellulitis of right lower leg 05/09/2014   Hyperglycemia 03/21/2013   Type 2 diabetes mellitus with foot ulcer (HCC) 03/21/2013   Noncompliance with medications due to cost issues 03/21/2013   PCP:  Kirstie Peri, MD Pharmacy:   [facility] [address] [place] - [address] [place] [address] Phone: [phone] Fax: [phone]     Social Determinants of Health (SDOH) Interventions    Readmission Risk Interventions No flowsheet data found.

## 2020-11-01 NOTE — Care Management Important Message (Signed)
Important Message  Patient Details  Name: Mitchell [name] MRN: [id number] Date of Birth: Mar 23, 1950   Medicare Important Message Given:  Yes     Anyla Israelson Stefan Church 11/01/2020, 3:44 PM

## 2020-11-02 DIAGNOSIS — M868X7 Other osteomyelitis, ankle and foot: Secondary | ICD-10-CM | POA: Diagnosis not present

## 2020-11-02 LAB — BASIC METABOLIC PANEL
Anion gap: 6 (ref 5–15)
BUN: 13 mg/dL (ref 8–23)
CO2: 25 mmol/L (ref 22–32)
Calcium: 7.9 mg/dL — ABNORMAL LOW (ref 8.9–10.3)
Chloride: 99 mmol/L (ref 98–111)
Creatinine, Ser: 0.72 mg/dL (ref 0.61–1.24)
GFR, Estimated: >60 mL/min (ref >60)
Glucose, Bld: 95 mg/dL (ref 70–99)
Potassium: 3.7 mmol/L (ref 3.5–5.1)
Sodium: 130 mmol/L — ABNORMAL LOW (ref 135–145)

## 2020-11-02 LAB — CBC WITH DIFFERENTIAL/PLATELET
Abs Immature Granulocytes: 0.02 10*3/uL (ref 0.00–0.07)
Basophils Absolute: 0 10*3/uL (ref 0.0–0.1)
Basophils Relative: 0 %
Eosinophils Absolute: 0.5 10*3/uL (ref 0.0–0.5)
Eosinophils Relative: 7 %
HCT: 31.9 % — ABNORMAL LOW (ref 39.0–52.0)
Hemoglobin: 8.5 g/dL — ABNORMAL LOW (ref 13.0–17.0)
Immature Granulocytes: 0 %
Lymphocytes Relative: 18 %
Lymphs Abs: 1.4 10*3/uL (ref 0.7–4.0)
MCH: 19.1 pg — ABNORMAL LOW (ref 26.0–34.0)
MCHC: 26.6 g/dL — ABNORMAL LOW (ref 30.0–36.0)
MCV: 71.5 fL — ABNORMAL LOW (ref 80.0–100.0)
Monocytes Absolute: 0.7 10*3/uL (ref 0.1–1.0)
Monocytes Relative: 9 %
Neutro Abs: 4.7 10*3/uL (ref 1.7–7.7)
Neutrophils Relative %: 66 %
Platelets: 347 10*3/uL (ref 150–400)
RBC: 4.46 MIL/uL (ref 4.22–5.81)
RDW: 31.8 % — ABNORMAL HIGH (ref 11.5–15.5)
WBC: 7.3 10*3/uL (ref 4.0–10.5)
nRBC: 0 % (ref 0.0–0.2)

## 2020-11-02 LAB — GLUCOSE, CAPILLARY
Glucose-Capillary: 99 mg/dL (ref 70–99)
Glucose-Capillary: 138 mg/dL — ABNORMAL HIGH (ref 70–99)
Glucose-Capillary: 121 mg/dL — ABNORMAL HIGH (ref 70–99)
Glucose-Capillary: 173 mg/dL — ABNORMAL HIGH (ref 70–99)

## 2020-11-02 LAB — MAGNESIUM: Magnesium: 2.1 mg/dL (ref 1.7–2.4)

## 2020-11-02 NOTE — Progress Notes (Signed)
Occupational Therapy Treatment Patient Details Name: Mitchell [name] MRN: [id number] DOB: Oct 22, 1950 Today's Date: 11/02/2020   History of present illness Mitchell [name] is a 70 year old male admitted 10/11 who presented to [facility] ER with several day complaint of right foot redness swelling and drainage. 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  Surgery took place on 10/27/20 followed by wound VAC placement. PMH:  T2DM on insulin, HTN, tobacco abuse, PVD, obesity, history of diabetic foot ulcers   OT comments  OT treatment session with focus on self-care re-education, ADL transfers, wc safety and mobility. Education provided on safety with toileting tasks at home. Patient reports toilet is several rooms away from bedroom. Education on use of urinal and BSC at bedside for safety. Patient reports wc will not fit into bathroom. Education on use of RW for short-distance mobility from bathroom door to commode in bathroom for sponge bathing with assist from family. Patient expressed verbal understanding. Upon standing from recliner this writer noted smear of BM on gown and chuck pad. Functional mobility to commode in bathroom with use of RW, Min guard and cues for heel WB. 3/3 parts of toileting task with Min A. OT will continue to follow acutely.    Recommendations for follow up therapy are one component of a multi-disciplinary discharge planning process, led by the attending physician.  Recommendations may be updated based on patient status, additional functional criteria and insurance authorization.    Follow Up Recommendations  SNF;Home health OT;Supervision/Assistance - 24 hour    Equipment Recommendations  3 in 1 bedside commode    Recommendations for Other Services      Precautions / Restrictions Precautions Precautions: Fall Precaution Comments: Wound VAC Required Braces or Orthoses: Other Brace Other Brace: Darco  shoe Restrictions Weight Bearing Restrictions: Yes RLE Weight Bearing: Partial weight bearing Other Position/Activity Restrictions: on heel with Draco shoe       Mobility Bed Mobility Overal bed mobility: Needs Assistance             General bed mobility comments: Seated in recliner upon entry.    Transfers Overall transfer level: Needs assistance Equipment used: Rolling walker (2 wheeled) Transfers: Sit to/from Stand Sit to Stand: Min guard         General transfer comment: Min guard from low recliner and from standard commode with cues for hand placement    Balance Overall balance assessment: Needs assistance Sitting-balance support: No upper extremity supported;Feet supported Sitting balance-Leahy Scale: Fair     Standing balance support: Bilateral upper extremity supported;During functional activity Standing balance-Leahy Scale: Poor Standing balance comment: relies on UE support for balance                           ADL either performed or assessed with clinical judgement   ADL Overall ADL's : Needs assistance/impaired                         Toilet Transfer: Cueing for safety;Cueing for sequencing;Min guard Toilet Transfer Details (indicate cue type and reason): Ambulated to commode in bathroom with Min gurad, RW and cues for WB on R heel only. Toileting- Clothing Manipulation and Hygiene: Minimal assistance;Cueing for safety;Cueing for sequencing;Sit to/from stand Toileting - Clothing Manipulation Details (indicate cue type and reason): Min A for hygiene/clothing management     Functional mobility during ADLs: Min guard;Cueing for safety;Cueing for sequencing;Rolling walker  Vision       Perception     Praxis      Cognition Arousal/Alertness: Awake/alert Behavior During Therapy: WFL for tasks assessed/performed Overall Cognitive Status: Within Functional Limits for tasks assessed                                           Exercises     Shoulder Instructions       General Comments      Pertinent Vitals/ Pain       Pain Assessment: 0-10 Pain Score: 2  Faces Pain Scale: Hurts a little bit Pain Location: Buttocks Pain Descriptors / Indicators: Aching;Guarding;Grimacing Pain Intervention(s): Monitored during session  Home Living                                          Prior Functioning/Environment              Frequency  Min 2X/week        Progress Toward Goals  OT Goals(current goals can now be found in the care plan section)  Progress towards OT goals: Progressing toward goals  Acute Rehab OT Goals Patient Stated Goal: to go home OT Goal Formulation: With patient Time For Goal Achievement: 11/12/20 Potential to Achieve Goals: Good ADL Goals Pt Will Perform Lower Body Bathing: with modified independence;sit to/from stand Pt Will Perform Lower Body Dressing: with modified independence;sit to/from stand Pt Will Perform Tub/Shower Transfer: with modified independence;ambulating;shower seat;grab bars;rolling walker  Plan Discharge plan remains appropriate;Frequency remains appropriate    Co-evaluation                 AM-PAC OT "6 Clicks" Daily Activity     Outcome Measure   Help from another person eating meals?: None Help from another person taking care of personal grooming?: A Little Help from another person toileting, which includes using toliet, bedpan, or urinal?: A Little Help from another person bathing (including washing, rinsing, drying)?: A Lot Help from another person to put on and taking off regular upper body clothing?: A Little Help from another person to put on and taking off regular lower body clothing?: A Lot 6 Click Score: 17    End of Session Equipment Utilized During Treatment: Gait belt;Rolling walker  OT Visit Diagnosis: Unsteadiness on feet (R26.81);Other abnormalities of gait and mobility (R26.89);Muscle  weakness (generalized) (M62.81);Pain   Activity Tolerance Patient tolerated treatment well   Patient Left in chair;with call bell/phone within reach;with chair alarm set   Nurse Communication Mobility status        Time: 9604-5409 OT Time Calculation (min): 35 min  Charges: OT General Charges $OT Visit: 1 Visit OT Treatments $Self Care/Home Management : 8-22 mins $Therapeutic Activity: 8-22 mins  Mitchell [name] H. OTR/L Supplemental OT, Department of rehab services [phone]  Mitchell [name] R H. 11/02/2020, 2:55 PM

## 2020-11-02 NOTE — Progress Notes (Signed)
Progress Note    Mitchell [name]   WUX:[id number]  DOB: Apr 25, 1950  DOA: 10/25/2020     8 Date of Service: 11/02/2020   Clinical Course Mitchell [name] is a 70 year old male with T2DM on insulin, HTN, tobacco abuse, PVD, obesity, history of diabetic foot ulcers who presented to [facility] with several day complaint of right foot redness swelling and drainage.   His right foot has been an ongoing issue for years Mitchell [name] stated on admission with a difficulty healing ulcer. With further work-up Mitchell [name] was also found to have dark stools and his hemoglobin noted to be 6.1 g/dL. Mitchell [name] was admitted for IV antibiotics, evaluation with vascular surgery and GI. Mitchell [name] ultimately underwent transmetatarsal amputation of the right foot due to osteomyelitis diagnosed on MRI.  Surgery took place on 10/27/20 followed by wound VAC placement.  GI evaluation then took place and Mitchell [name] underwent EGD on 10/28/2020.  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  Outpatient colonoscopy was also recommended once patient is discharged for further evaluation of his iron deficiency anemia.   Assessment and Plan * Foot osteomyelitis, right (HCC) - Diagnosed on MRI foot on admission - Underwent transmetatarsal amputation with vascular surgery on 10/27/2020 - Wound VAC in place with changes on Monday, Wednesday, Friday. -Has been on vancomycin, Rocephin, Flagyl since admission.  Still some calor noted in right lower leg on exam on 10/30/2020 but tenderness has improved.  Cultures reviewed.  At this time will de-escalate to Unasyn to continue coverage and discharge on Augmentin to complete course (2 weeks postop per surgery); PCN allergy noted, monitor for any rash/itching after starting Unasyn  -E faecalis and group B strep noted on wound cultures from admission - will await further rec's from vascular surgery on when patient able to discharge home   GI bleed - Hemoglobin nadir 6.1 g/dL.  Responded  well to blood transfusion - Currently hemoglobin remaining stable.  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  Obesity - Complicates overall prognosis and care - Body mass index is 34.71 kg/m. - Weight Loss and Dietary Counseling given    Subjective:  No events overnight.  Resting comfortably in bed this morning.  Pain and swelling continue to improve some and right leg.  Wound VAC was changed this morning.  Objective Vitals:   11/01/20 0754 11/01/20 1742 11/02/20 0501 11/02/20 0725  BP: (!) 151/58 (!) 172/76 (!) 159/59 (!) 170/78  Pulse: 88 86 80 94  Resp: 17 17  20   Temp: 98.3 F (36.8 C) 98 F (36.7 C) 98.5 F (36.9 C) 98.2 F (36.8 C)  TempSrc: Oral Axillary Oral Oral  SpO2: 96% 94% 96% 98%  Weight:      Height:       112.9 kg  Vital signs were reviewed and unremarkable.   Exam Physical Exam Constitutional:      Appearance: Normal appearance.  HENT:     Head: Normocephalic and atraumatic.     Mouth/Throat:  Mouth: Mucous membranes are moist.  Eyes:     Extraocular Movements: Extraocular movements intact.  Cardiovascular:     Rate and Rhythm: Normal rate and regular rhythm.  Pulmonary:     Effort: Pulmonary effort is normal. No respiratory distress.     Breath sounds: Normal breath sounds.  Abdominal:     General: Bowel sounds are normal. There is no distension.     Palpations: Abdomen is soft.     Tenderness: There is no abdominal tenderness.  Musculoskeletal:         General: Normal range of motion.     Cervical back: Normal range of motion and neck supple.     Comments: Wound VAC in place and right foot with improved edema now approximately 1-2+ and improved erythema.  No tenderness to palpation  Skin:    General: Skin is warm and dry.     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     Mental Status: Mitchell [name] is alert.  Psychiatric:        Mood and Affect: Mood normal.        Behavior: Behavior normal.     Labs / Other Information My review of labs, imaging, notes and other tests shows no new significant findings.    Disposition Plan: Status is: Inpatient  Remains inpatient appropriate because: Ongoing wound VAC dressing changes inpatient with further monitoring   Time spent: Greater than 50% of the 35 minute visit was spent in counseling/coordination of care for the patient as laid out in the A&P.  Lewie Chamber, MD [facility] 11/02/2020, 2:44 PM

## 2020-11-02 NOTE — Progress Notes (Signed)
  Progress Note    11/02/2020 12:13 PM 5 Days Post-Op  Subjective:  no complaints. Sitting up eating breakfast. States only intermittent phantom pains in right foot   Vitals:   11/02/20 0501 11/02/20 0725  BP: (!) 159/59 (!) 170/78  Pulse: 80 94  Resp:  20  Temp: 98.5 F (36.9 C) 98.2 F (36.8 C)  SpO2: 96% 98%   Physical Exam: Cardiac:  regular Lungs:  non labored Incisions:  right foot TMA with wound VAC to suction, good seal. 200 cc bloody output in canister Extremities:  well perfused and warm. Right TMA and distal leg still erythematous. Palpable AT R foot. Left palpable DP/ PT. Neurologic: alert and oriented  CBC    Component Value Date/Time   WBC 7.3 11/02/2020 0156   RBC 4.46 11/02/2020 0156   HGB 8.5 (L) 11/02/2020 0156   HCT 31.9 (L) 11/02/2020 0156   PLT 347 11/02/2020 0156   MCV 71.5 (L) 11/02/2020 0156   MCH 19.1 (L) 11/02/2020 0156   MCHC 26.6 (L) 11/02/2020 0156   RDW 31.8 (H) 11/02/2020 0156   LYMPHSABS 1.4 11/02/2020 0156   MONOABS 0.7 11/02/2020 0156   EOSABS 0.5 11/02/2020 0156   BASOSABS 0.0 11/02/2020 0156    BMET    Component Value Date/Time   NA 130 (L) 11/02/2020 0156   K 3.7 11/02/2020 0156   CL 99 11/02/2020 0156   CO2 25 11/02/2020 0156   GLUCOSE 95 11/02/2020 0156   BUN 13 11/02/2020 0156   CREATININE 0.72 11/02/2020 0156   CALCIUM 7.9 (L) 11/02/2020 0156   GFRNONAA >60 11/02/2020 0156   GFRAA >60 08/29/2014 1627    INR    Component Value Date/Time   INR 1.2 10/25/2020 1550     Intake/Output Summary (Last 24 hours) at 11/02/2020 1213 Last data filed at 11/02/2020 0727 Gross per 24 hour  Intake 540 ml  Output 1300 ml  Net -760 ml     Assessment/Plan:  70 y.o. male is s/p R TMA 5 Days Post-Op   Continues to do well Wound VAC changed today by RN. Will continue MWF changes Remains on Unasyn. Continue x 2 weeks per Dr. Myra Gianotti Plan is for him to go home with [facility] PT/OT and Aid or RN  Graceann Congress,  [facility] [phone] 11/02/2020 12:13 PM

## 2020-11-02 NOTE — Progress Notes (Addendum)
Mobility Specialist Progress Note    11/02/20 1131  Mobility  Activity Ambulated in hall  Level of Assistance Standby assist, set-up cues, supervision of patient - no hands on  Assistive Device Front wheel walker  Distance Ambulated (ft) 230 ft  Mobility Ambulated with assistance in hallway  Mobility Response Tolerated well  Mobility performed by Mobility specialist  Bed Position Chair  $Mobility charge 1 Mobility   Pt received in bed and agreeable. C/o some phantom limb feeling with toes and pressure at suction site while walking. Returned to chair with call bell in reach.   [place] Mobility Specialist  Mobility Specialist Phone: [phone]

## 2020-11-02 NOTE — Consult Note (Addendum)
WOC Nurse wound follow up Patient receiving care in [facility] 6N23 Wound type: Surgical amputation of the right toes. Sutures in place on the anterior side. Wound bed: Granulation in the wound bed with a small amount of yellow fibrinous tissue in the center. Not bleeding as much when dressing removed.  Measurements: 3 x 10 x 0.8 Drainage (amount, consistency, odor) Bloody in canister.  Periwound: Maceration surrounding the wound Dressing procedure/placement/frequency: Removed one piece of black foam from the wound and one piece that was used for bridging.  Placed one piece of black foam in the wound. Placed 2 barrier rings around the entire wound after cleaning with 63M skin barrier wipes. Bridged to the anterior side of the foot and ankle. Drape applied, seal obtained  at 125 mmHg.    WOC will follow M/W/F. 2 dressing kits at bedside.    Renaldo Reel Katrinka Blazing, MSN, RN, CMSRN, Angus Seller, [facility] Wound Treatment Associate Pager [phone]

## 2020-11-03 DIAGNOSIS — D500 Iron deficiency anemia secondary to blood loss (chronic): Secondary | ICD-10-CM | POA: Diagnosis not present

## 2020-11-03 DIAGNOSIS — K922 Gastrointestinal hemorrhage, unspecified: Secondary | ICD-10-CM | POA: Diagnosis not present

## 2020-11-03 DIAGNOSIS — M868X7 Other osteomyelitis, ankle and foot: Secondary | ICD-10-CM | POA: Diagnosis not present

## 2020-11-03 LAB — CBC WITH DIFFERENTIAL/PLATELET
Abs Immature Granulocytes: 0.04 10*3/uL (ref 0.00–0.07)
Basophils Absolute: 0 10*3/uL (ref 0.0–0.1)
Basophils Relative: 0 %
Eosinophils Absolute: 0.6 10*3/uL — ABNORMAL HIGH (ref 0.0–0.5)
Eosinophils Relative: 9 %
HCT: 37.5 % — ABNORMAL LOW (ref 39.0–52.0)
Hemoglobin: 9.7 g/dL — ABNORMAL LOW (ref 13.0–17.0)
Immature Granulocytes: 1 %
Lymphocytes Relative: 12 %
Lymphs Abs: 0.8 10*3/uL (ref 0.7–4.0)
MCH: 19.2 pg — ABNORMAL LOW (ref 26.0–34.0)
MCHC: 25.9 g/dL — ABNORMAL LOW (ref 30.0–36.0)
MCV: 74.3 fL — ABNORMAL LOW (ref 80.0–100.0)
Monocytes Absolute: 0.6 10*3/uL (ref 0.1–1.0)
Monocytes Relative: 9 %
Neutro Abs: 4.7 10*3/uL (ref 1.7–7.7)
Neutrophils Relative %: 69 %
Platelets: 311 10*3/uL (ref 150–400)
RBC: 5.05 MIL/uL (ref 4.22–5.81)
RDW: 32.2 % — ABNORMAL HIGH (ref 11.5–15.5)
WBC: 6.7 10*3/uL (ref 4.0–10.5)
nRBC: 0 % (ref 0.0–0.2)

## 2020-11-03 LAB — GLUCOSE, CAPILLARY
Glucose-Capillary: 119 mg/dL — ABNORMAL HIGH (ref 70–99)
Glucose-Capillary: 133 mg/dL — ABNORMAL HIGH (ref 70–99)
Glucose-Capillary: 98 mg/dL (ref 70–99)
Glucose-Capillary: 126 mg/dL — ABNORMAL HIGH (ref 70–99)
Glucose-Capillary: 145 mg/dL — ABNORMAL HIGH (ref 70–99)

## 2020-11-03 LAB — MAGNESIUM: Magnesium: 1.8 mg/dL (ref 1.7–2.4)

## 2020-11-03 LAB — BASIC METABOLIC PANEL
Anion gap: 13 (ref 5–15)
BUN: 12 mg/dL (ref 8–23)
CO2: 20 mmol/L — ABNORMAL LOW (ref 22–32)
Calcium: 8.3 mg/dL — ABNORMAL LOW (ref 8.9–10.3)
Chloride: 104 mmol/L (ref 98–111)
Creatinine, Ser: 0.71 mg/dL (ref 0.61–1.24)
GFR, Estimated: >60 mL/min (ref >60)
Glucose, Bld: 129 mg/dL — ABNORMAL HIGH (ref 70–99)
Potassium: 4.7 mmol/L (ref 3.5–5.1)
Sodium: 137 mmol/L (ref 135–145)

## 2020-11-03 NOTE — Progress Notes (Signed)
   11/03/20 1200   PT - Assessment/Plan  PT Frequency (ACUTE ONLY) Min 3X/week  Progressive Mobility  Mobility Sit up in bed/chair position for meals  Updated frequency to 3xweek as this is appropriate frequency for pt.   Hansen Carino M,PT Acute Rehab Services [phone] (pager)

## 2020-11-03 NOTE — Progress Notes (Addendum)
PROGRESS NOTE    Mitchell [name]  ZOX:[id number] DOB: Jan 19, 1950 DOA: 10/25/2020 PCP: Kirstie Peri, MD    Chief Complaint  Patient presents with   Foot Pain    Brief Narrative:  Mitchell [name] is a 70 year old male with T2DM on insulin, HTN, tobacco abuse, PVD, obesity, history of diabetic foot ulcers who presented to [facility] ER with several day complaint of right foot redness swelling and drainage.   His right foot has been an ongoing issue for years he stated on admission with a difficulty healing ulcer. With further work-up he was also found to have dark stools and his hemoglobin noted to be 6.1 g/dL. He was admitted for IV antibiotics, evaluation with vascular surgery and GI. 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  Surgery took place on 10/27/20 followed by wound VAC placement.   GI evaluation then took place and he underwent EGD on 10/28/2020.  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  Outpatient colonoscopy was also recommended once patient is discharged for further evaluation of his iron deficiency anemia.   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 Wound cultures growing Enterococcus faecalis and group B streptococcus. Patient currently on Unasyn. Await further recommendations from vascular surgery.   GI bleed S/p 4 unit of PRBC transfusion during this hospitalization Underwent EGD on 10/28/2020 revealing 5 AVMs which were  nonbleeding.  Treated with APC and clip. Outpatient follow-up with GI for colonoscopy.  Acute blood loss anemia from GI bleed.   Type 2 diabetes mellitus with diabetic foot wound. Continue sliding scale insulin while inpatient Hemoglobin A1c around 6.3 this admission. .  Continue with Semglee 15 units daily and Jardiance 25 mg daily. Continue with ferrous sulfate 325 mg daily.   Diabetic foot ulcer with right osteomyelitis.    Peripheral neuropathy Continue with Neurontin.   Obesity Recommend outpatient follow-up with PCP for weight loss   Tobacco abuse cessation counseling given Currently on nicotine patch.   Hyponatremia Resolved.   Right upper extremity swelling Patient reported that it started swelling after IV infiltration Venous duplex of the right upper extremity ordered to evaluate for DVT.   Hyperlipidemia Continue with Pravachol  DVT prophylaxis: (scd's) Code Status: (Full Code) Family Communication: none at bedside.  Disposition:   Status is: Inpatient  Remains inpatient appropriate because: of IV antibiotics and SNF placement.        Consultants:  Vascular surgery.   Procedures:  Underwent transmetatarsal amputation with vascular surgery on 10/27/2020  EGD on 10/28/2020  Antimicrobials: Antibiotics Given (last 72 hours)     Date/Time Action Medication Dose Rate   10/31/20 2101 New Bag/Given   Ampicillin-Sulbactam (UNASYN) 3 g in sodium chloride 0.9 % 100 mL IVPB 3 g 200 mL/hr   11/01/20 0514 New Bag/Given   Ampicillin-Sulbactam (UNASYN) 3 g in sodium chloride 0.9 % 100 mL IVPB 3 g 200 mL/hr   11/01/20 1230 New Bag/Given   Ampicillin-Sulbactam (  UNASYN) 3 g in sodium chloride 0.9 % 100 mL IVPB 3 g 200 mL/hr   11/01/20 2144 New Bag/Given   Ampicillin-Sulbactam (UNASYN) 3 g in sodium chloride 0.9 % 100 mL IVPB 3 g 200 mL/hr   11/02/20 0452 New Bag/Given   Ampicillin-Sulbactam (UNASYN) 3 g in sodium chloride 0.9 % 100 mL IVPB 3 g 200  mL/hr   11/02/20 1205 New Bag/Given   Ampicillin-Sulbactam (UNASYN) 3 g in sodium chloride 0.9 % 100 mL IVPB 3 g 200 mL/hr   11/02/20 2009 New Bag/Given   Ampicillin-Sulbactam (UNASYN) 3 g in sodium chloride 0.9 % 100 mL IVPB 3 g 200 mL/hr   11/03/20 0429 New Bag/Given   Ampicillin-Sulbactam (UNASYN) 3 g in sodium chloride 0.9 % 100 mL IVPB 3 g 200 mL/hr   11/03/20 1313 New Bag/Given   Ampicillin-Sulbactam (UNASYN) 3 g in sodium chloride 0.9 % 100 mL IVPB 3 g 200 mL/hr         Subjective: Pain controlled, wants to know when he can be discharged.   Objective: Vitals:   11/02/20 1600 11/02/20 2002 11/03/20 0320 11/03/20 0756  BP: (!) 162/60 (!) 177/77 131/62 (!) 137/46  Pulse: 67 96 89 83  Resp: 18   18  Temp: 98.2 F (36.8 C) 98.7 F (37.1 C) 99.4 F (37.4 C) 99.5 F (37.5 C)  TempSrc: Oral Oral Oral Oral  SpO2: 98% 97% 94% 94%  Weight:      Height:        Intake/Output Summary (Last 24 hours) at 11/03/2020 1351 Last data filed at 11/03/2020 1059 Gross per 24 hour  Intake 580 ml  Output 800 ml  Net -220 ml   Filed Weights   10/25/20 1347 10/27/20 0838  Weight: 112.9 kg 112.9 kg    Examination:  General exam: Appears calm and comfortable  Respiratory system: Clear to auscultation. Respiratory effort normal. Cardiovascular system: S1 & S2 heard, RRR. No JVD No pedal edema. Gastrointestinal system: Abdomen is nondistended, soft and nontender.  Normal bowel sounds heard. Central nervous system: Alert and oriented. No focal neurological deficits. Extremities: right upper extremity swollen.right foot wound on would vac. Skin: No rashes, lesions or ulcers Psychiatry: Mood & affect appropriate.     Data Reviewed: I have personally reviewed following labs and imaging studies  CBC: Recent Labs  Lab 10/30/20 0115 10/31/20 0206 11/01/20 0150 11/02/20 0156 11/03/20 0148  WBC 10.9* 8.1 8.4 7.3 6.7  NEUTROABS 8.7* 5.5 6.0 4.7 4.7  HGB 8.5* 8.7* 8.8* 8.5* 9.7*   HCT 32.7* 33.1* 33.5* 31.9* 37.5*  MCV 70.9* 72.1* 71.9* 71.5* 74.3*  PLT 349 351 360 347 311    Basic Metabolic Panel: Recent Labs  Lab 10/30/20 0115 10/31/20 0206 11/01/20 0150 11/02/20 0156 11/03/20 0148  NA 132* 134* 134* 130* 137  K 4.2 5.1 4.3 3.7 4.7  CL 100 101 101 99 104  CO2 24 26 25 25  20*  GLUCOSE 116* 109* 117* 95 129*  BUN 16 18 15 13 12   CREATININE 0.99 1.13 0.82 0.72 0.71  CALCIUM 7.8* 8.3* 8.1* 7.9* 8.3*  MG 2.1 2.2 2.2 2.1 1.8    GFR: Estimated Creatinine Clearance: 109.7 mL/min (by C-G formula based on SCr of 0.71 mg/dL).  Liver Function Tests: No results for input(s): AST, ALT, ALKPHOS, BILITOT, PROT, ALBUMIN in the last 168 hours.  CBG: Recent Labs  Lab 11/02/20 1147 11/02/20 1600 11/02/20 1959 11/03/20 0758 11/03/20 1148  GLUCAP 138* 121* 173* 119* 133*  Recent Results (from the past 240 hour(s))  Culture, blood (routine x 2)     Status: None   Collection Time: 10/25/20  2:45 PM   Specimen: Right Antecubital; Blood  Result Value Ref Range Status   Specimen Description RIGHT ANTECUBITAL  Final   Special Requests   Final    BOTTLES DRAWN AEROBIC AND ANAEROBIC Blood Culture results may not be optimal due to an excessive volume of blood received in culture bottles   Culture   Final    NO GROWTH 5 DAYS Performed at [facility], [address], [place], [place] [address]    Report Status 10/30/2020 FINAL  Final  Culture, blood (routine x 2)     Status: None   Collection Time: 10/25/20  2:45 PM   Specimen: Left Antecubital; Blood  Result Value Ref Range Status   Specimen Description LEFT ANTECUBITAL  Final   Special Requests   Final    BOTTLES DRAWN AEROBIC AND ANAEROBIC Blood Culture results may not be optimal due to an excessive volume of blood received in culture bottles   Culture   Final    NO GROWTH 5 DAYS Performed at [facility], [address] [place], [place] [address]    Report Status 10/30/2020 FINAL  Final  Resp Panel  by RT-PCR (Flu A&B, Covid) Nasopharyngeal Swab     Status: None   Collection Time: 10/25/20  3:45 PM   Specimen: Nasopharyngeal Swab; Nasopharyngeal(NP) swabs in vial transport medium  Result Value Ref Range Status   SARS Coronavirus 2 by RT PCR NEGATIVE NEGATIVE Final    Comment: (NOTE) SARS-CoV-2 target nucleic acids are NOT DETECTED.  The SARS-CoV-2 RNA is generally detectable in upper respiratory specimens during the acute phase of infection. The lowest concentration of SARS-CoV-2 viral copies this assay can detect is 138 copies/mL. A negative result does not preclude SARS-Cov-2 infection and should not be used as the sole basis for treatment or other patient management decisions. A negative result may occur with  improper specimen collection/handling, submission of specimen other than nasopharyngeal swab, presence of viral mutation(s) within the areas targeted by this assay, and inadequate number of viral copies(<138 copies/mL). A negative result must be combined with clinical observations, patient history, and epidemiological information. The expected result is Negative.  Fact Sheet for Patients:  BloggerCourse.com  Fact Sheet for Healthcare Providers:  SeriousBroker.it  This test is no t yet approved or cleared by the [place] FDA and  has been authorized for detection and/or diagnosis of SARS-CoV-2 by FDA under an Emergency Use Authorization (EUA). This EUA will remain  in effect (meaning this test can be used) for the duration of the COVID-19 declaration under Section 564(b)(1) of the Act, 21 U.S.C.section 360bbb-3(b)(1), unless the authorization is terminated  or revoked sooner.       Influenza A by PCR NEGATIVE NEGATIVE Final   Influenza B by PCR NEGATIVE NEGATIVE Final    Comment: (NOTE) The Xpert Xpress SARS-CoV-2/FLU/RSV plus assay is intended as an aid in the diagnosis of influenza from Nasopharyngeal swab  specimens and should not be used as a sole basis for treatment. Nasal washings and aspirates are unacceptable for Xpert Xpress SARS-CoV-2/FLU/RSV testing.  Fact Sheet for Patients: BloggerCourse.com  Fact Sheet for Healthcare Providers: SeriousBroker.it  This test is not yet approved or cleared by the [place] FDA and has been authorized for detection and/or diagnosis of SARS-CoV-2 by FDA under an Emergency Use Authorization (EUA). This EUA will remain in  effect (meaning this test can be used) for the duration of the COVID-19 declaration under Section 564(b)(1) of the Act, 21 U.S.C. section 360bbb-3(b)(1), unless the authorization is terminated or revoked.  Performed at [facility], [address] [place], [place] [address]   Aerobic Culture w Gram Stain (superficial specimen)     Status: None   Collection Time: 10/25/20  3:55 PM   Specimen: Foot; Wound  Result Value Ref Range Status   Specimen Description   Final    FOOT Performed at [facility], [address] [place], [place] [address]    Special Requests   Final    RIGHT Performed at [facility], [address], [place], [place] [address]    Gram Stain   Final    NO SQUAMOUS EPITHELIAL CELLS SEEN FEW WBC SEEN FEW GRAM NEGATIVE RODS MODERATE GRAM POSITIVE COCCI    Culture   Final    ABUNDANT STREPTOCOCCUS AGALACTIAE TESTING AGAINST S. AGALACTIAE NOT ROUTINELY PERFORMED DUE TO PREDICTABILITY OF AMP/PEN/VAN SUSCEPTIBILITY. ABUNDANT ENTEROCOCCUS FAECALIS WITHIN MIXED ORGANISMS Performed at [facility], [address] [place], [place] [address]    Report Status 10/29/2020 FINAL  Final   Organism ID, Bacteria ENTEROCOCCUS FAECALIS  Final      Susceptibility   Enterococcus faecalis - MIC*    AMPICILLIN <=2 SENSITIVE Sensitive     VANCOMYCIN 1 SENSITIVE Sensitive     GENTAMICIN SYNERGY SENSITIVE Sensitive     * ABUNDANT ENTEROCOCCUS FAECALIS   Aerobic/Anaerobic Culture w Gram Stain (surgical/deep wound)     Status: None   Collection Time: 10/27/20 11:05 AM   Specimen: PATH Other; Body Fluid  Result Value Ref Range Status   Specimen Description FLUID  Final   Special Requests DIABETIC ULCER ON RIGHT FOOT SPEC A  Final   Gram Stain   Final    FEW WBC PRESENT, PREDOMINANTLY MONONUCLEAR FEW GRAM POSITIVE COCCI IN PAIRS RARE GRAM VARIABLE ROD Performed at [facility], [address], [place], [place] [address]    Culture   Final    ABUNDANT STREPTOCOCCUS AGALACTIAE TESTING AGAINST S. AGALACTIAE NOT ROUTINELY PERFORMED DUE TO PREDICTABILITY OF AMP/PEN/VAN SUSCEPTIBILITY. ABUNDANT ENTEROCOCCUS FAECALIS WITHIN MIXED ORGANISMS MIXED ANAEROBIC FLORA PRESENT.  CALL LAB IF FURTHER IID REQUIRED.    Report Status 10/30/2020 FINAL  Final   Organism ID, Bacteria ENTEROCOCCUS FAECALIS  Final      Susceptibility   Enterococcus faecalis - MIC*    AMPICILLIN <=2 SENSITIVE Sensitive     VANCOMYCIN 1 SENSITIVE Sensitive     GENTAMICIN SYNERGY SENSITIVE Sensitive     * ABUNDANT ENTEROCOCCUS FAECALIS         Radiology Studies: No results found.      Scheduled Meds:  empagliflozin  25 mg Oral Daily   ferrous sulfate  325 mg Oral Q breakfast   gabapentin  300 mg Oral TID   insulin aspart  0-5 Units Subcutaneous QHS   insulin aspart  0-9 Units Subcutaneous TID WC   insulin aspart  5 Units Subcutaneous TID WC   insulin glargine-yfgn  15 Units Subcutaneous Daily   nicotine  21 mg Transdermal Daily   polyethylene glycol  17 g Oral Daily   pravastatin  20 mg Oral q1800   senna-docusate  1 tablet Oral Daily   Continuous Infusions:  ampicillin-sulbactam (UNASYN) IV 3 g (11/03/20 1313)     LOS: 9 days       Kathlen Mody, MD [facility]  To contact the attending provider between 7A-7P or the covering provider during after hours 7P-7A, please log into the web site [email or web address] and access using universal  [place] password for that web site. If you do not have the password, please call the hospital operator.  11/03/2020, 1:51 PM

## 2020-11-03 NOTE — Progress Notes (Signed)
Mobility Specialist Progress Note    11/03/20 1602  Mobility  Activity Ambulated in hall  Level of Assistance Contact guard assist, steadying assist  Assistive Device Front wheel walker  Distance Ambulated (ft) 250 ft  Mobility Ambulated with assistance in hallway  Mobility Response Tolerated well  Mobility performed by Mobility specialist  Bed Position Chair  $Mobility charge 1 Mobility   Pt received in bed and agreeable. Had successful BM in BR. Pt skin is very red and itchy all over his legs and back, RN notified. C/o phantom limb on walk. Took x1 standing break and felt fatigued upon return to room. Left in chair with call bell in reach and NT present.   Pt concerned about when he will discharge so he can notify son about pick up.    [place] Mobility Specialist  Mobility Specialist Phone: [phone]

## 2020-11-04 ENCOUNTER — Inpatient Hospital Stay (HOSPITAL_COMMUNITY): Payer: Medicare HMO

## 2020-11-04 DIAGNOSIS — M7989 Other specified soft tissue disorders: Secondary | ICD-10-CM | POA: Diagnosis not present

## 2020-11-04 DIAGNOSIS — M868X7 Other osteomyelitis, ankle and foot: Secondary | ICD-10-CM | POA: Diagnosis not present

## 2020-11-04 DIAGNOSIS — E11621 Type 2 diabetes mellitus with foot ulcer: Secondary | ICD-10-CM | POA: Diagnosis not present

## 2020-11-04 DIAGNOSIS — L97412 Non-pressure chronic ulcer of right heel and midfoot with fat layer exposed: Secondary | ICD-10-CM | POA: Diagnosis not present

## 2020-11-04 DIAGNOSIS — D500 Iron deficiency anemia secondary to blood loss (chronic): Secondary | ICD-10-CM | POA: Diagnosis not present

## 2020-11-04 LAB — GLUCOSE, CAPILLARY
Glucose-Capillary: 178 mg/dL — ABNORMAL HIGH (ref 70–99)
Glucose-Capillary: 111 mg/dL — ABNORMAL HIGH (ref 70–99)

## 2020-11-04 LAB — BASIC METABOLIC PANEL
Anion gap: 6 (ref 5–15)
BUN: 13 mg/dL (ref 8–23)
CO2: 26 mmol/L (ref 22–32)
Calcium: 8.1 mg/dL — ABNORMAL LOW (ref 8.9–10.3)
Chloride: 104 mmol/L (ref 98–111)
Creatinine, Ser: 0.83 mg/dL (ref 0.61–1.24)
GFR, Estimated: >60 mL/min (ref >60)
Glucose, Bld: 128 mg/dL — ABNORMAL HIGH (ref 70–99)
Potassium: 4.1 mmol/L (ref 3.5–5.1)
Sodium: 136 mmol/L (ref 135–145)

## 2020-11-04 MED ORDER — AMOXICILLIN-POT CLAVULANATE 875-125 MG PO TABS: 1.0000 | ORAL_TABLET | Freq: Two times a day (BID) | ORAL | 0 refills | Status: AC

## 2020-11-04 MED ORDER — POLYETHYLENE GLYCOL 3350 17 G PO PACK: 17.0000 g | PACK | Freq: Every day | ORAL | 0 refills | Status: DC

## 2020-11-04 MED ORDER — OXYCODONE-ACETAMINOPHEN 5-325 MG PO TABS: 1.0000 | ORAL_TABLET | Freq: Three times a day (TID) | ORAL | 0 refills | Status: AC | PRN

## 2020-11-04 MED ORDER — FERROUS SULFATE 325 (65 FE) MG PO TABS: 325.0000 mg | ORAL_TABLET | Freq: Every day | ORAL | 3 refills | Status: DC

## 2020-11-04 NOTE — Progress Notes (Signed)
Physical Therapy Treatment Patient Details Name: Mitchell [name] MRN: [id number] DOB: 1950-11-01 Today's Date: 11/04/2020   History of Present Illness Mitchell [name] is a 70 year old male admitted 10/11 who presented to [facility] with several day complaint of right foot redness swelling and drainage. 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  Surgery took place on 10/27/20 followed by wound VAC placement. PMH:  T2DM on insulin, HTN, tobacco abuse, PVD, obesity, history of diabetic foot ulcers    PT Comments    Pt admitted with above diagnosis. Pt was able to ambulate with good safety with RW in hallway. No LOB.  Pt progressing well overall and will most likely go home this weekend.  Pt currently with functional limitations due to balance and endurance deficits. Pt will benefit from skilled PT to increase their independence and safety with mobility to allow discharge to the venue listed below.      Recommendations for follow up therapy are one component of a multi-disciplinary discharge planning process, led by the attending physician.  Recommendations may be updated based on patient status, additional functional criteria and insurance authorization.  Follow Up Recommendations  SNF (SNF recommended as pt with decr safety at times and has difficulty with ADLS on his own  but pt refusing therefore HHPT, HHOT, HHaide)     Equipment Recommendations  Rolling walker with 5" wheels;Wheelchair (measurements PT);Wheelchair cushion (measurements PT)    Recommendations for Other Services       Precautions / Restrictions Precautions Precautions: Fall Precaution Comments: Wound VAC Required Braces or Orthoses: Other Brace Other Brace: Darco shoe Restrictions RLE Weight Bearing: Partial weight bearing Other Position/Activity Restrictions: on heel with Draco shoe     Mobility  Bed Mobility Overal bed mobility: Needs Assistance Bed Mobility:  Supine to Sit     Supine to sit: Supervision     General bed mobility comments: Pt able to get shoes on without assist    Transfers Overall transfer level: Needs assistance Equipment used: Rolling walker (2 wheeled) Transfers: Sit to/from Stand Sit to Stand: Supervision         General transfer comment: Min guard from low recliner and supervision from bed.  Ambulation/Gait Ambulation/Gait assistance: Min guard Gait Distance (Feet): 250 Feet Assistive device: Rolling walker (2 wheeled) Gait Pattern/deviations: Step-to pattern;Decreased stance time - right;Decreased weight shift to right;Trunk flexed;Antalgic   Gait velocity interpretation: <1.8 ft/sec, indicate of risk for recurrent falls General Gait Details: Pt ambulated to hallway and back with RW wtih overall good stability. No LOB with challenges.   Stairs             Wheelchair Mobility    Modified Rankin (Stroke Patients Only)       Balance Overall balance assessment: Needs assistance Sitting-balance support: No upper extremity supported;Feet supported Sitting balance-Leahy Scale: Fair     Standing balance support: Bilateral upper extremity supported;During functional activity Standing balance-Leahy Scale: Poor Standing balance comment: relies on UE support for balance                            Cognition Arousal/Alertness: Awake/alert Behavior During Therapy: WFL for tasks assessed/performed Overall Cognitive Status: Within Functional Limits for tasks assessed  Exercises General Exercises - Lower Extremity Long Arc Quad: AROM;Both;10 reps;Seated    General Comments General comments (skin integrity, edema, etc.): VAC in place      Pertinent Vitals/Pain Pain Assessment: No/denies pain    Home Living                      Prior Function            PT Goals (current goals can now be found in the care plan  section) Acute Rehab PT Goals Patient Stated Goal: to go home Progress towards PT goals: Progressing toward goals    Frequency    Min 3X/week      PT Plan Current plan remains appropriate    Co-evaluation              AM-PAC PT "6 Clicks" Mobility   Outcome Measure  Help needed turning from your back to your side while in a flat bed without using bedrails?: None Help needed moving from lying on your back to sitting on the side of a flat bed without using bedrails?: A Little Help needed moving to and from a bed to a chair (including a wheelchair)?: A Little Help needed standing up from a chair using your arms (e.g., wheelchair or bedside chair)?: A Little Help needed to walk in hospital room?: A Little Help needed climbing 3-5 steps with a railing? : A Lot 6 Click Score: 18    End of Session Equipment Utilized During Treatment: Gait belt Activity Tolerance: Patient tolerated treatment well Patient left: in chair;with call bell/phone within reach;with chair alarm set Nurse Communication: Mobility status PT Visit Diagnosis: Unsteadiness on feet (R26.81);Muscle weakness (generalized) (M62.81);Pain Pain - Right/Left: Right Pain - part of body: Ankle and joints of foot     Time: 8469-6295 PT Time Calculation (min) (ACUTE ONLY): 25 min  Charges:  $Gait Training: 8-22 mins $Therapeutic Exercise: 8-22 mins                     Mitchell [name] M,PT Acute Rehab Services [phone] (pager)    Mitchell [name] 11/04/2020, 3:23 PM

## 2020-11-04 NOTE — Progress Notes (Signed)
Right upper extremity venous duplex has been completed. Preliminary results can be found in CV Proc through chart review.  Results were given to the patient's nurse, Sarala.  11/04/20 1:37 PM Olen Cordial RVT

## 2020-11-04 NOTE — Progress Notes (Signed)
  Progress Note    11/04/2020 10:07 AM 7 Days Post-Op  Subjective:  no complaints. Sitting up in chair   Vitals:   11/04/20 0405 11/04/20 0805  BP: (!) 157/68 134/83  Pulse: 74 91  Resp: 18   Temp: 99 F (37.2 C) 97.9 F (36.6 C)  SpO2: 97% 96%   Physical Exam: Cardiac:  regular Lungs:  non labored Incisions:  right TMA with wound VAC to suction Wound VAC removed. Wound as depicted below. Granulation tissue in wound bed well appearing. Some maceration of wound edges. Distal leg and TMA still with some erythema    Extremities:  well perfused and warm with palpable AT right foot Neurologic: alert and oriented  CBC    Component Value Date/Time   WBC 6.7 11/03/2020 0148   RBC 5.05 11/03/2020 0148   HGB 9.7 (L) 11/03/2020 0148   HCT 37.5 (L) 11/03/2020 0148   PLT 311 11/03/2020 0148   MCV 74.3 (L) 11/03/2020 0148   MCH 19.2 (L) 11/03/2020 0148   MCHC 25.9 (L) 11/03/2020 0148   RDW 32.2 (H) 11/03/2020 0148   LYMPHSABS 0.8 11/03/2020 0148   MONOABS 0.6 11/03/2020 0148   EOSABS 0.6 (H) 11/03/2020 0148   BASOSABS 0.0 11/03/2020 0148    BMET    Component Value Date/Time   NA 136 11/04/2020 0136   K 4.1 11/04/2020 0136   CL 104 11/04/2020 0136   CO2 26 11/04/2020 0136   GLUCOSE 128 (H) 11/04/2020 0136   BUN 13 11/04/2020 0136   CREATININE 0.83 11/04/2020 0136   CALCIUM 8.1 (L) 11/04/2020 0136   GFRNONAA >60 11/04/2020 0136   GFRAA >60 08/29/2014 1627    INR    Component Value Date/Time   INR 1.2 10/25/2020 1550     Intake/Output Summary (Last 24 hours) at 11/04/2020 1007 Last data filed at 11/04/2020 0600 Gross per 24 hour  Intake 1340 ml  Output 500 ml  Net 840 ml     Assessment/Plan:  70 y.o. male is s/p right TMA 7 Days Post-Op   Wound well appearing RLE well perfused and warm. Still some erythema of distal leg. Continue ABx x 2 weeks Vac removed and replaced by wound care RN today Next VAC change will be Monday PT/ OT recommend SNF but  patient refusing so will need [facility] PT/OT/aide Home wound VAC ordered Will continue to follow while inpatient He will have outpatient follow up in a couple weeks for wound check  Graceann Congress, PA-C [facility] [phone] 11/04/2020 10:07 AM

## 2020-11-04 NOTE — Consult Note (Signed)
[facility] Nurse wound follow up Wound type: Routine NPWT dressing change.  [facility] PA-C has been in and taken down dressing for assessment and photo. Measurement:3cm x 10cm x 0.8cm Wound VAC:QPEAK red, moist Drainage (amount, consistency, odor) serous to serosanguinous Periwound: Intact, dry with sutures Dressing procedure/placement/frequency: One piece of foam used to obliterate dead space, one piece of foam used to bridge proximally in an attempt to reduce tripping on tubing. Skin barrier ring placed around wound to enhance seal. Foam bridge placed on top of drape and covered with additional drape to include in NPWT dressing. An immediate seal is achieved. Foot is wrapped in Kerlix roll gauze and topped with 6-inch ACE to secure without applying pressure.  Next dressing change is due on Monday, 10/24. Dressing kit in room.  Note: Supplies for discharge to home are also in room in box sent from [facility].  [place] nursing team will follow, and will remain available to this patient, the nursing and medical teams.   Thanks, Maudie Flakes, MSN, RN, [place], Arther Abbott  Pager# [phone]

## 2020-11-05 NOTE — Progress Notes (Signed)
Pt called and stated that his discharge prescription for pain medicine could not be filled at [facility] pharmacy as they did not have the medication. Pt was basically asking for advice as to how he can get the pain medicine. Consulted discharging MD who advised that after 2hrs of discharge hospital doctors can not assist with script related matters but advised that the patient ask [facility] to see which other pharmacy may have the medicine and transfer the script to that pharmacy. Pt also told that he can consult his primary MD if the above does not work

## 2020-11-06 DIAGNOSIS — E1142 Type 2 diabetes mellitus with diabetic polyneuropathy: Secondary | ICD-10-CM | POA: Diagnosis not present

## 2020-11-06 DIAGNOSIS — K922 Gastrointestinal hemorrhage, unspecified: Secondary | ICD-10-CM | POA: Diagnosis not present

## 2020-11-06 DIAGNOSIS — M86171 Other acute osteomyelitis, right ankle and foot: Secondary | ICD-10-CM | POA: Diagnosis not present

## 2020-11-06 DIAGNOSIS — E1169 Type 2 diabetes mellitus with other specified complication: Secondary | ICD-10-CM | POA: Diagnosis not present

## 2020-11-06 DIAGNOSIS — Z4801 Encounter for change or removal of surgical wound dressing: Secondary | ICD-10-CM | POA: Diagnosis not present

## 2020-11-06 DIAGNOSIS — D500 Iron deficiency anemia secondary to blood loss (chronic): Secondary | ICD-10-CM | POA: Diagnosis not present

## 2020-11-06 DIAGNOSIS — E1151 Type 2 diabetes mellitus with diabetic peripheral angiopathy without gangrene: Secondary | ICD-10-CM | POA: Diagnosis not present

## 2020-11-06 DIAGNOSIS — Z89431 Acquired absence of right foot: Secondary | ICD-10-CM | POA: Diagnosis not present

## 2020-11-06 DIAGNOSIS — R69 Illness, unspecified: Secondary | ICD-10-CM | POA: Diagnosis not present

## 2020-11-06 DIAGNOSIS — Z4781 Encounter for orthopedic aftercare following surgical amputation: Secondary | ICD-10-CM | POA: Diagnosis not present

## 2020-11-07 DIAGNOSIS — K922 Gastrointestinal hemorrhage, unspecified: Secondary | ICD-10-CM | POA: Diagnosis not present

## 2020-11-07 DIAGNOSIS — M86171 Other acute osteomyelitis, right ankle and foot: Secondary | ICD-10-CM | POA: Diagnosis not present

## 2020-11-07 DIAGNOSIS — D500 Iron deficiency anemia secondary to blood loss (chronic): Secondary | ICD-10-CM | POA: Diagnosis not present

## 2020-11-07 DIAGNOSIS — E1142 Type 2 diabetes mellitus with diabetic polyneuropathy: Secondary | ICD-10-CM | POA: Diagnosis not present

## 2020-11-07 DIAGNOSIS — Z89431 Acquired absence of right foot: Secondary | ICD-10-CM | POA: Diagnosis not present

## 2020-11-07 DIAGNOSIS — Z4801 Encounter for change or removal of surgical wound dressing: Secondary | ICD-10-CM | POA: Diagnosis not present

## 2020-11-07 DIAGNOSIS — E1151 Type 2 diabetes mellitus with diabetic peripheral angiopathy without gangrene: Secondary | ICD-10-CM | POA: Diagnosis not present

## 2020-11-07 DIAGNOSIS — E11621 Type 2 diabetes mellitus with foot ulcer: Secondary | ICD-10-CM | POA: Diagnosis not present

## 2020-11-07 DIAGNOSIS — E1169 Type 2 diabetes mellitus with other specified complication: Secondary | ICD-10-CM | POA: Diagnosis not present

## 2020-11-07 DIAGNOSIS — R69 Illness, unspecified: Secondary | ICD-10-CM | POA: Diagnosis not present

## 2020-11-07 DIAGNOSIS — Z4781 Encounter for orthopedic aftercare following surgical amputation: Secondary | ICD-10-CM | POA: Diagnosis not present

## 2020-11-07 NOTE — Discharge Summary (Signed)
Physician Discharge Summary  Mitchell [name] ZCH:[id number] DOB: 03-27-1950 DOA: 10/25/2020  PCP: Kirstie Peri, MD  Admit date: 10/25/2020 Discharge date: 11/04/2020  Admitted From: Home.  Disposition:  Home. Pt refused SNF.   Recommendations for Outpatient Follow-up:  Follow up with PCP in 1-2 weeks Please obtain BMP/CBC in one week Please follow up with gastroenterology for colonoscopy for evaluation of anemia.  Please follow up with vascular surgery as recommended.   Home Health:yes   Discharge Condition:guarded.  CODE STATUS:Full code.  Diet recommendation: Heart Healthy   Brief/Interim Summary: Mitchell [name] is a 69 year old male with T2DM on insulin, HTN, tobacco abuse, PVD, obesity, history of diabetic foot ulcers who presented to [facility] ER with several day complaint of right foot redness swelling and drainage.   His right foot has been an ongoing issue for years he stated on admission with a difficulty healing ulcer. With further work-up he was also found to have dark stools and his hemoglobin noted to be 6.1 g/dL. He was admitted for IV antibiotics, evaluation with vascular surgery and GI. 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  Surgery took place on 10/27/20 followed by wound VAC placement.   GI evaluation then took place and he underwent EGD on 10/28/2020.  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  Outpatient colonoscopy was also recommended once patient is discharged for further evaluation of his iron deficiency anemia.  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 Wound cultures growing Enterococcus faecalis and group B streptococcus. Patient currently on Unasyn.transitioned to oral augmentin to complete the course.      GI bleed S/p 4 unit of PRBC transfusion during this hospitalization Underwent EGD on 10/28/2020 revealing 5 AVMs which were nonbleeding.  Treated with APC and clip. Outpatient follow-up with GI for colonoscopy.        Type 2 diabetes mellitus with diabetic foot wound. Continue sliding scale insulin while inpatient Hemoglobin A1c around 6.3 this admission. Resume home meds.    Acute blood loss anemia from GI bleed. Continue with ferrous sulfate 325 mg daily.     Diabetic foot ulcer with right osteomyelitis.       Peripheral neuropathy Continue with Neurontin.     Obesity Recommend outpatient follow-up with PCP for weight loss     Tobacco abuse cessation counseling given Currently on nicotine patch.     Hyponatremia Resolved.     Right upper extremity swelling Patient reported that it started swelling after IV infiltration Venous duplex of the right upper extremity is negative for DVT, but was found to have superficial DVT in the cephalic vein.     Hyperlipidemia Continue with Pravachol     Therapy eval recommending SNF, pt refused, wanted to go home.   Discharge Instructions  Discharge Instructions     Diet - low sodium heart healthy   Complete by: As directed    Discharge wound care:   Complete by: As directed  Wound care instructions as per vascular surgery.   Increase activity slowly   Complete by: As directed       Allergies as of 11/04/2020   No Active Allergies      Medication List     STOP taking these medications    metFORMIN 500 MG tablet Commonly known as: GLUCOPHAGE   sulfamethoxazole-trimethoprim 800-160 MG  tablet Commonly known as: BACTRIM DS       TAKE these medications    amoxicillin-clavulanate 875-125 MG tablet Commonly known as: Augmentin Take 1 tablet by mouth every 12 (twelve) hours for 7 days.   aspirin EC 81 MG tablet Take 81 mg by mouth daily.   clotrimazole-betamethasone cream Commonly known as: LOTRISONE Apply topically.   ferrous sulfate 325 (65 FE) MG tablet Take 1 tablet (325 mg total) by mouth daily with breakfast.   furosemide 20 MG tablet Commonly known as: LASIX Take 1 tablet (20 mg total) by mouth daily.   gabapentin 300 MG capsule Commonly known as: NEURONTIN Take 1 capsule (300 mg total) by mouth 3 (three) times daily.   Jardiance 25 MG Tabs tablet Generic drug: empagliflozin Take 25 mg by mouth daily.   lovastatin 20 MG tablet Commonly known as: MEVACOR Take 1 tablet (20 mg total) by mouth at bedtime. What changed: when to take this   NovoLOG FlexPen 100 UNIT/ML FlexPen Generic drug: insulin aspart Inject 15-30 Units into the skin See admin instructions. 15 units with breakfast 30 units with lunch 30 units with dinner   oxyCODONE-acetaminophen 5-325 MG tablet Commonly known as: PERCOCET/ROXICET Take 1 tablet by mouth every 8 (eight) hours as needed for up to 3 days for moderate pain or severe pain.   polyethylene glycol 17 g packet Commonly known as: MIRALAX / GLYCOLAX Take 17 g by mouth daily.   Evaristo Bury FlexTouch 200 UNIT/ML FlexTouch Pen Generic drug: insulin degludec Inject 55 Units into the skin daily.               Discharge Care Instructions  (From admission, onward)           Start     Ordered   11/04/20 0000  Discharge wound care:       Comments: Wound care instructions as per vascular surgery.   11/04/20 1502            Follow-up Information     [facility] Follow up.   Specialty: Home Health Services Contact information: [address] [place]  [address] [phone]         [facility] Follow up.   Why: [employer] provided walker Contact information: [address] [place] [address] [phone]         [employer] Follow up.   Why: Company that provided wound pump 1 [phone]               No Active Allergies  Consultations: Vascular surgery.    Procedures/Studies: MR FOOT RIGHT W WO CONTRAST  Result Date: 10/26/2020 CLINICAL DATA:  Nonhealing wounds. Diabetic foot ulcer. Concern for osteomyelitis. EXAM: MRI OF THE RIGHT FOREFOOT WITHOUT AND WITH CONTRAST TECHNIQUE: Multiplanar, multisequence MR imaging of the right forefoot was performed before and after the administration of intravenous contrast. CONTRAST:  10mL GADAVIST GADOBUTROL 1 MMOL/ML IV SOLN COMPARISON:  X-ray 10/25/2020 FINDINGS: Bones/Joint/Cartilage Postsurgical changes of third toe amputation at the third MTP joint level. The fourth MTP joint is dorsally dislocated.  Marked bone marrow edema and enhancement with confluent low T1 signal change throughout the proximal phalanx of the fourth toe and throughout the fourth metatarsal extending from the level of the metatarsal head to the proximal metaphysis, compatible with acute osteomyelitis. Complex fourth MTP joint effusion compatible with septic arthritis. Preservation of the marrow signal within the middle and distal phalanx of fourth toe. Fifth toe hammertoe deformity. There is bone marrow edema within the fifth toe and fifth metatarsal head without confluent low T1 signal changes. Acute or subacute fracture involving the second toe proximal phalanx without significant displacement (series 11, image 20). Degenerative changes of the great toe IP joint. Marrow signal within the first ray is maintained. No bony abnormality is seen within the midfoot. Ligaments Intact Lisfranc ligament. Muscles and Tendons Findings of denervation and/or myositis throughout the forefoot. No  significant tenosynovial fluid collection. Soft tissues Soft tissue ulceration underlying the level of the fourth MTP joint with rim enhancing air and fluid collection surrounding the fourth metatarsal head and fourth MTP joint measuring 3.1 x 1.5 x 1.9 cm (series 13, image 18). Additional adjacent abscess overlies the dorsal aspect of the fourth MTP joint measuring 2.1 x 0.9 x 2.0 cm (series 14, image 12). Extensive circumferential subcutaneous edema. IMPRESSION: 1. Acute osteomyelitis of the fourth metatarsal and proximal phalanx of the fourth toe. 2. Plantar soft tissue ulceration underlying the level of the fourth MTP joint with 3.1 cm abscess surrounding the fourth metatarsal head and fourth MTP joint. Additional 2.1 cm abscess overlies the dorsal aspect of the fourth MTP joint. 3. Bone marrow edema within the fifth toe and fifth metatarsal head without confluent low T1 signal changes. Findings may reflect reactive osteitis. Early acute osteomyelitis at this site is difficult to exclude 4. Acute or subacute fracture involving the second toe proximal phalanx. 5. Extensive circumferential subcutaneous edema, likely cellulitis. 6. Findings of muscle denervation and/or myositis throughout the forefoot. Electronically Signed   By: Duanne Guess D.O.   On: 10/26/2020 08:18   US ARTERIAL ABI (SCREENING LOWER EXTREMITY)  Result Date: 10/26/2020 CLINICAL DATA:  Peripheral vascular disease EXAM: NONINVASIVE PHYSIOLOGIC VASCULAR STUDY OF BILATERAL LOWER EXTREMITIES TECHNIQUE: Evaluation of both lower extremities were performed at rest, including calculation of ankle-brachial indices with single level Doppler, pressure and pulse volume recording. COMPARISON:  None. FINDINGS: Right ABI:  1.1 Left ABI:  1.1 Right Lower Extremity:  Abnormal monophasic arterial waveforms. Left Lower Extremity: Abnormal monophasic posterior tibial arterial waveform. Dorsalis pedis waveform is biphasic. 1.0-1.4 Normal IMPRESSION:  Technically, the resting bilateral ankle-brachial indices are within normal limits. However, the waveforms are abnormal in suggest upstream obstructive disease. The ABIs may be falsely elevated secondary to poor compressibility of the vessels in the setting of chronic medial calcinosis. If clinical concern for significant peripheral arterial disease persists, consider multilevel segmental noninvasive evaluation or CT arteriogram with runoff. Signed, Sterling Big, MD, RPVI [facility] Electronically Signed   By: Malachy Moan M.D.   On: 10/26/2020 09:15   DG Foot Complete Right  Result Date: 10/25/2020 CLINICAL DATA:  Right foot infection. EXAM: RIGHT FOOT COMPLETE - 3+ VIEW COMPARISON:  January 14, 2019. FINDINGS: Status post amputation of third toe. Defect is seen involving distal portions of second, third and fifth metatarsals consistent with old osteomyelitis. There is possible lytic destruction involving the distal fourth metatarsal concerning for osteomyelitis. Gas is seen in the adjacent soft tissues suggesting cellulitis. Severe swelling of the second and  fourth toes is noted IMPRESSION: Chronic changes as described above. Possible acute lytic destruction involving distal fourth metatarsal concerning for osteomyelitis. Gas is seen in the adjacent soft tissues suggesting cellulitis. Electronically Signed   By: Lupita Raider M.D.   On: 10/25/2020 14:30   VAS [place] UPPER EXTREMITY VENOUS DUPLEX  Result Date: 11/04/2020 UPPER VENOUS STUDY  Patient Name:  Mitchell [name]  Date of Exam:   11/04/2020 Medical Rec #: [id number]   Accession #:    [id number] Date of Birth: December 28, 1950   Patient Gender: M Patient Age:   44 years Exam Location:  [facility] Procedure:      VAS [place] UPPER EXTREMITY VENOUS DUPLEX Referring Phys: Kathlen Mody --------------------------------------------------------------------------------  Indications: Swelling  Risk Factors: None identified. Comparison Study: No prior studies. Performing Technologist: Chanda Busing RVT  Examination Guidelines: A complete evaluation includes B-mode imaging, spectral Doppler, color Doppler, and power Doppler as needed of all accessible portions of each vessel. Bilateral testing is considered an integral part of a complete examination. Limited examinations for reoccurring indications may be performed as noted.  Right Findings: +----------+------------+---------+-----------+----------+-------+ RIGHT     CompressiblePhasicitySpontaneousPropertiesSummary +----------+------------+---------+-----------+----------+-------+ IJV           Full       Yes       Yes                      +----------+------------+---------+-----------+----------+-------+ Subclavian    Full       Yes       Yes                      +----------+------------+---------+-----------+----------+-------+ Axillary      Full       Yes       Yes                      +----------+------------+---------+-----------+----------+-------+ Brachial      Full       Yes       Yes                      +----------+------------+---------+-----------+----------+-------+ Radial        Full                                          +----------+------------+---------+-----------+----------+-------+ Ulnar         Full                                          +----------+------------+---------+-----------+----------+-------+ Cephalic      None                                   Acute  +----------+------------+---------+-----------+----------+-------+ Basilic       Full                                          +----------+------------+---------+-----------+----------+-------+ Superficial thrombus noted in the cephalic vein extends from the antecubital fossa into the mid upper arm.  Left Findings: +----------+------------+---------+-----------+----------+-------+ LEFT       CompressiblePhasicitySpontaneousPropertiesSummary +----------+------------+---------+-----------+----------+-------+  Subclavian    Full       Yes       Yes                      +----------+------------+---------+-----------+----------+-------+  Summary:  Right: No evidence of deep vein thrombosis in the upper extremity. Findings consistent with acute superficial vein thrombosis involving the right cephalic vein.  Left: No evidence of thrombosis in the subclavian.  *See table(s) above for measurements and observations.  Diagnosing physician: Waverly Ferrari MD Electronically signed by Waverly Ferrari MD on 11/04/2020 at 3:11:08 PM.    Final       Subjective: No new complaints.   Discharge Exam: Vitals:   11/04/20 0405 11/04/20 0805  BP: (!) 157/68 134/83  Pulse: 74 91  Resp: 18   Temp: 99 F (37.2 C) 97.9 F (36.6 C)  SpO2: 97% 96%   Vitals:   11/03/20 1602 11/03/20 1937 11/04/20 0405 11/04/20 0805  BP: (!) 141/55 128/63 (!) 157/68 134/83  Pulse: 83 86 74 91  Resp: 19 19 18    Temp: 98.7 F (37.1 C) 98.4 F (36.9 C) 99 F (37.2 C) 97.9 F (36.6 C)  TempSrc: Oral Oral  Oral  SpO2: 95% 95% 97% 96%  Weight:      Height:        General: Pt is alert, awake, not in acute distress Cardiovascular: RRR, S1/S2 +, no rubs, no gallops Respiratory: CTA bilaterally, no wheezing, no rhonchi Abdominal: Soft, NT, ND, bowel sounds + Extremities: no edema, no cyanosis    The results of significant diagnostics from this hospitalization (including imaging, microbiology, ancillary and laboratory) are listed below for reference.     Microbiology: No results found for this or any previous visit (from the past 240 hour(s)).   Labs: BNP (last 3 results) No results for input(s): BNP in the last 8760 hours. Basic Metabolic Panel: Recent Labs  Lab 11/01/20 0150 11/02/20 0156 11/03/20 0148 11/04/20 0136  NA 134* 130* 137 136  K 4.3 3.7 4.7 4.1  CL 101 99 104 104  CO2  25 25 20* 26  GLUCOSE 117* 95 129* 128*  BUN 15 13 12 13   CREATININE 0.82 0.72 0.71 0.83  CALCIUM 8.1* 7.9* 8.3* 8.1*  MG 2.2 2.1 1.8  --    Liver Function Tests: No results for input(s): AST, ALT, ALKPHOS, BILITOT, PROT, ALBUMIN in the last 168 hours. No results for input(s): LIPASE, AMYLASE in the last 168 hours. No results for input(s): AMMONIA in the last 168 hours. CBC: Recent Labs  Lab 11/01/20 0150 11/02/20 0156 11/03/20 0148  WBC 8.4 7.3 6.7  NEUTROABS 6.0 4.7 4.7  HGB 8.8* 8.5* 9.7*  HCT 33.5* 31.9* 37.5*  MCV 71.9* 71.5* 74.3*  PLT 360 347 311   Cardiac Enzymes: No results for input(s): CKTOTAL, CKMB, CKMBINDEX, TROPONINI in the last 168 hours. BNP: Invalid input(s): POCBNP CBG: Recent Labs  Lab 11/03/20 1447 11/03/20 1559 11/03/20 2138 11/04/20 0815 11/04/20 1130  GLUCAP 98 126* 145* 178* 111*   D-Dimer No results for input(s): DDIMER in the last 72 hours. Hgb A1c No results for input(s): HGBA1C in the last 72 hours. Lipid Profile No results for input(s): CHOL, HDL, LDLCALC, TRIG, CHOLHDL, LDLDIRECT in the last 72 hours. Thyroid function studies No results for input(s): TSH, T4TOTAL, T3FREE, THYROIDAB in the last 72 hours.  Invalid input(s): FREET3 Anemia work up No results for input(s): VITAMINB12, FOLATE, FERRITIN, TIBC, IRON, RETICCTPCT in the last  72 hours. Urinalysis    Component Value Date/Time   COLORURINE YELLOW 05/09/2014 1650   APPEARANCEUR CLEAR 05/09/2014 1650   LABSPEC 1.020 05/09/2014 1650   PHURINE 5.0 05/09/2014 1650   GLUCOSEU >1000 (A) 05/09/2014 1650   HGBUR NEGATIVE 05/09/2014 1650   BILIRUBINUR NEGATIVE 05/09/2014 1650   KETONESUR NEGATIVE 05/09/2014 1650   PROTEINUR NEGATIVE 05/09/2014 1650   UROBILINOGEN 0.2 05/09/2014 1650   NITRITE NEGATIVE 05/09/2014 1650   LEUKOCYTESUR NEGATIVE 05/09/2014 1650   Sepsis Labs Invalid input(s): PROCALCITONIN,  WBC,  LACTICIDVEN Microbiology No results found for this or any  previous visit (from the past 240 hour(s)).   Time coordinating discharge: 38 minutes.  SIGNED:   Kathlen Mody, MD  [facility]  Addendum:  Discharged the patient on oxycodone for pain, received a call requesting to call in narcotic pain medication/ oxycodone to a different pharmacy. The discharge encounter on epic Unable to send the prescription as pt has been discharged more than 2 hours ago.    Kathlen Mody,  MD

## 2020-11-09 DIAGNOSIS — Z4801 Encounter for change or removal of surgical wound dressing: Secondary | ICD-10-CM | POA: Diagnosis not present

## 2020-11-09 DIAGNOSIS — M86171 Other acute osteomyelitis, right ankle and foot: Secondary | ICD-10-CM | POA: Diagnosis not present

## 2020-11-09 DIAGNOSIS — K922 Gastrointestinal hemorrhage, unspecified: Secondary | ICD-10-CM | POA: Diagnosis not present

## 2020-11-09 DIAGNOSIS — E1169 Type 2 diabetes mellitus with other specified complication: Secondary | ICD-10-CM | POA: Diagnosis not present

## 2020-11-09 DIAGNOSIS — E1151 Type 2 diabetes mellitus with diabetic peripheral angiopathy without gangrene: Secondary | ICD-10-CM | POA: Diagnosis not present

## 2020-11-09 DIAGNOSIS — E1142 Type 2 diabetes mellitus with diabetic polyneuropathy: Secondary | ICD-10-CM | POA: Diagnosis not present

## 2020-11-09 DIAGNOSIS — R69 Illness, unspecified: Secondary | ICD-10-CM | POA: Diagnosis not present

## 2020-11-09 DIAGNOSIS — Z89431 Acquired absence of right foot: Secondary | ICD-10-CM | POA: Diagnosis not present

## 2020-11-09 DIAGNOSIS — Z4781 Encounter for orthopedic aftercare following surgical amputation: Secondary | ICD-10-CM | POA: Diagnosis not present

## 2020-11-09 DIAGNOSIS — D500 Iron deficiency anemia secondary to blood loss (chronic): Secondary | ICD-10-CM | POA: Diagnosis not present

## 2020-11-11 DIAGNOSIS — Z4801 Encounter for change or removal of surgical wound dressing: Secondary | ICD-10-CM | POA: Diagnosis not present

## 2020-11-11 DIAGNOSIS — E1142 Type 2 diabetes mellitus with diabetic polyneuropathy: Secondary | ICD-10-CM | POA: Diagnosis not present

## 2020-11-11 DIAGNOSIS — E1169 Type 2 diabetes mellitus with other specified complication: Secondary | ICD-10-CM | POA: Diagnosis not present

## 2020-11-11 DIAGNOSIS — D500 Iron deficiency anemia secondary to blood loss (chronic): Secondary | ICD-10-CM | POA: Diagnosis not present

## 2020-11-11 DIAGNOSIS — Z89431 Acquired absence of right foot: Secondary | ICD-10-CM | POA: Diagnosis not present

## 2020-11-11 DIAGNOSIS — K922 Gastrointestinal hemorrhage, unspecified: Secondary | ICD-10-CM | POA: Diagnosis not present

## 2020-11-11 DIAGNOSIS — R69 Illness, unspecified: Secondary | ICD-10-CM | POA: Diagnosis not present

## 2020-11-11 DIAGNOSIS — M86171 Other acute osteomyelitis, right ankle and foot: Secondary | ICD-10-CM | POA: Diagnosis not present

## 2020-11-11 DIAGNOSIS — Z4781 Encounter for orthopedic aftercare following surgical amputation: Secondary | ICD-10-CM | POA: Diagnosis not present

## 2020-11-11 DIAGNOSIS — E1151 Type 2 diabetes mellitus with diabetic peripheral angiopathy without gangrene: Secondary | ICD-10-CM | POA: Diagnosis not present

## 2020-11-14 DIAGNOSIS — Z4801 Encounter for change or removal of surgical wound dressing: Secondary | ICD-10-CM | POA: Diagnosis not present

## 2020-11-14 DIAGNOSIS — K922 Gastrointestinal hemorrhage, unspecified: Secondary | ICD-10-CM | POA: Diagnosis not present

## 2020-11-14 DIAGNOSIS — E1169 Type 2 diabetes mellitus with other specified complication: Secondary | ICD-10-CM | POA: Diagnosis not present

## 2020-11-14 DIAGNOSIS — R69 Illness, unspecified: Secondary | ICD-10-CM | POA: Diagnosis not present

## 2020-11-14 DIAGNOSIS — E1151 Type 2 diabetes mellitus with diabetic peripheral angiopathy without gangrene: Secondary | ICD-10-CM | POA: Diagnosis not present

## 2020-11-14 DIAGNOSIS — E1142 Type 2 diabetes mellitus with diabetic polyneuropathy: Secondary | ICD-10-CM | POA: Diagnosis not present

## 2020-11-14 DIAGNOSIS — Z4781 Encounter for orthopedic aftercare following surgical amputation: Secondary | ICD-10-CM | POA: Diagnosis not present

## 2020-11-14 DIAGNOSIS — M86171 Other acute osteomyelitis, right ankle and foot: Secondary | ICD-10-CM | POA: Diagnosis not present

## 2020-11-14 DIAGNOSIS — Z89431 Acquired absence of right foot: Secondary | ICD-10-CM | POA: Diagnosis not present

## 2020-11-14 DIAGNOSIS — D500 Iron deficiency anemia secondary to blood loss (chronic): Secondary | ICD-10-CM | POA: Diagnosis not present

## 2020-11-16 DIAGNOSIS — Z4781 Encounter for orthopedic aftercare following surgical amputation: Secondary | ICD-10-CM | POA: Diagnosis not present

## 2020-11-16 DIAGNOSIS — M86171 Other acute osteomyelitis, right ankle and foot: Secondary | ICD-10-CM | POA: Diagnosis not present

## 2020-11-16 DIAGNOSIS — K922 Gastrointestinal hemorrhage, unspecified: Secondary | ICD-10-CM | POA: Diagnosis not present

## 2020-11-16 DIAGNOSIS — Z89431 Acquired absence of right foot: Secondary | ICD-10-CM | POA: Diagnosis not present

## 2020-11-16 DIAGNOSIS — E1151 Type 2 diabetes mellitus with diabetic peripheral angiopathy without gangrene: Secondary | ICD-10-CM | POA: Diagnosis not present

## 2020-11-16 DIAGNOSIS — E1169 Type 2 diabetes mellitus with other specified complication: Secondary | ICD-10-CM | POA: Diagnosis not present

## 2020-11-16 DIAGNOSIS — R69 Illness, unspecified: Secondary | ICD-10-CM | POA: Diagnosis not present

## 2020-11-16 DIAGNOSIS — D500 Iron deficiency anemia secondary to blood loss (chronic): Secondary | ICD-10-CM | POA: Diagnosis not present

## 2020-11-16 DIAGNOSIS — E1142 Type 2 diabetes mellitus with diabetic polyneuropathy: Secondary | ICD-10-CM | POA: Diagnosis not present

## 2020-11-16 DIAGNOSIS — Z4801 Encounter for change or removal of surgical wound dressing: Secondary | ICD-10-CM | POA: Diagnosis not present

## 2020-11-18 ENCOUNTER — Telehealth: Payer: Self-pay

## 2020-11-18 DIAGNOSIS — E1151 Type 2 diabetes mellitus with diabetic peripheral angiopathy without gangrene: Secondary | ICD-10-CM | POA: Diagnosis not present

## 2020-11-18 DIAGNOSIS — Z4801 Encounter for change or removal of surgical wound dressing: Secondary | ICD-10-CM | POA: Diagnosis not present

## 2020-11-18 DIAGNOSIS — E1169 Type 2 diabetes mellitus with other specified complication: Secondary | ICD-10-CM | POA: Diagnosis not present

## 2020-11-18 DIAGNOSIS — Z4781 Encounter for orthopedic aftercare following surgical amputation: Secondary | ICD-10-CM | POA: Diagnosis not present

## 2020-11-18 DIAGNOSIS — Z89431 Acquired absence of right foot: Secondary | ICD-10-CM | POA: Diagnosis not present

## 2020-11-18 DIAGNOSIS — D500 Iron deficiency anemia secondary to blood loss (chronic): Secondary | ICD-10-CM | POA: Diagnosis not present

## 2020-11-18 DIAGNOSIS — E1142 Type 2 diabetes mellitus with diabetic polyneuropathy: Secondary | ICD-10-CM | POA: Diagnosis not present

## 2020-11-18 DIAGNOSIS — M86171 Other acute osteomyelitis, right ankle and foot: Secondary | ICD-10-CM | POA: Diagnosis not present

## 2020-11-18 DIAGNOSIS — K922 Gastrointestinal hemorrhage, unspecified: Secondary | ICD-10-CM | POA: Diagnosis not present

## 2020-11-18 DIAGNOSIS — R69 Illness, unspecified: Secondary | ICD-10-CM | POA: Diagnosis not present

## 2020-11-18 NOTE — Telephone Encounter (Signed)
Mitchell [name] from [facility] verbal okay to do wound vac change on Sunday instead of Monday per patient request.

## 2020-11-20 DIAGNOSIS — E1169 Type 2 diabetes mellitus with other specified complication: Secondary | ICD-10-CM | POA: Diagnosis not present

## 2020-11-20 DIAGNOSIS — Z89431 Acquired absence of right foot: Secondary | ICD-10-CM | POA: Diagnosis not present

## 2020-11-20 DIAGNOSIS — Z4781 Encounter for orthopedic aftercare following surgical amputation: Secondary | ICD-10-CM | POA: Diagnosis not present

## 2020-11-20 DIAGNOSIS — K922 Gastrointestinal hemorrhage, unspecified: Secondary | ICD-10-CM | POA: Diagnosis not present

## 2020-11-20 DIAGNOSIS — E1142 Type 2 diabetes mellitus with diabetic polyneuropathy: Secondary | ICD-10-CM | POA: Diagnosis not present

## 2020-11-20 DIAGNOSIS — D500 Iron deficiency anemia secondary to blood loss (chronic): Secondary | ICD-10-CM | POA: Diagnosis not present

## 2020-11-20 DIAGNOSIS — Z4801 Encounter for change or removal of surgical wound dressing: Secondary | ICD-10-CM | POA: Diagnosis not present

## 2020-11-20 DIAGNOSIS — E1151 Type 2 diabetes mellitus with diabetic peripheral angiopathy without gangrene: Secondary | ICD-10-CM | POA: Diagnosis not present

## 2020-11-20 DIAGNOSIS — M86171 Other acute osteomyelitis, right ankle and foot: Secondary | ICD-10-CM | POA: Diagnosis not present

## 2020-11-20 DIAGNOSIS — R69 Illness, unspecified: Secondary | ICD-10-CM | POA: Diagnosis not present

## 2020-11-21 DIAGNOSIS — D500 Iron deficiency anemia secondary to blood loss (chronic): Secondary | ICD-10-CM | POA: Diagnosis not present

## 2020-11-21 DIAGNOSIS — M86171 Other acute osteomyelitis, right ankle and foot: Secondary | ICD-10-CM | POA: Diagnosis not present

## 2020-11-21 DIAGNOSIS — Z4781 Encounter for orthopedic aftercare following surgical amputation: Secondary | ICD-10-CM | POA: Diagnosis not present

## 2020-11-21 DIAGNOSIS — E1151 Type 2 diabetes mellitus with diabetic peripheral angiopathy without gangrene: Secondary | ICD-10-CM | POA: Diagnosis not present

## 2020-11-21 DIAGNOSIS — Z89431 Acquired absence of right foot: Secondary | ICD-10-CM | POA: Diagnosis not present

## 2020-11-21 DIAGNOSIS — K922 Gastrointestinal hemorrhage, unspecified: Secondary | ICD-10-CM | POA: Diagnosis not present

## 2020-11-21 DIAGNOSIS — E1142 Type 2 diabetes mellitus with diabetic polyneuropathy: Secondary | ICD-10-CM | POA: Diagnosis not present

## 2020-11-21 DIAGNOSIS — R69 Illness, unspecified: Secondary | ICD-10-CM | POA: Diagnosis not present

## 2020-11-21 DIAGNOSIS — T8131XA Disruption of external operation (surgical) wound, not elsewhere classified, initial encounter: Secondary | ICD-10-CM | POA: Diagnosis not present

## 2020-11-21 DIAGNOSIS — E1169 Type 2 diabetes mellitus with other specified complication: Secondary | ICD-10-CM | POA: Diagnosis not present

## 2020-11-21 DIAGNOSIS — Z4801 Encounter for change or removal of surgical wound dressing: Secondary | ICD-10-CM | POA: Diagnosis not present

## 2020-11-23 DIAGNOSIS — Z4781 Encounter for orthopedic aftercare following surgical amputation: Secondary | ICD-10-CM | POA: Diagnosis not present

## 2020-11-23 DIAGNOSIS — Z89431 Acquired absence of right foot: Secondary | ICD-10-CM | POA: Diagnosis not present

## 2020-11-23 DIAGNOSIS — M86171 Other acute osteomyelitis, right ankle and foot: Secondary | ICD-10-CM | POA: Diagnosis not present

## 2020-11-23 DIAGNOSIS — D500 Iron deficiency anemia secondary to blood loss (chronic): Secondary | ICD-10-CM | POA: Diagnosis not present

## 2020-11-23 DIAGNOSIS — Z4801 Encounter for change or removal of surgical wound dressing: Secondary | ICD-10-CM | POA: Diagnosis not present

## 2020-11-23 DIAGNOSIS — E1151 Type 2 diabetes mellitus with diabetic peripheral angiopathy without gangrene: Secondary | ICD-10-CM | POA: Diagnosis not present

## 2020-11-23 DIAGNOSIS — R69 Illness, unspecified: Secondary | ICD-10-CM | POA: Diagnosis not present

## 2020-11-23 DIAGNOSIS — K922 Gastrointestinal hemorrhage, unspecified: Secondary | ICD-10-CM | POA: Diagnosis not present

## 2020-11-23 DIAGNOSIS — E1142 Type 2 diabetes mellitus with diabetic polyneuropathy: Secondary | ICD-10-CM | POA: Diagnosis not present

## 2020-11-23 DIAGNOSIS — E1169 Type 2 diabetes mellitus with other specified complication: Secondary | ICD-10-CM | POA: Diagnosis not present

## 2020-11-25 DIAGNOSIS — M86171 Other acute osteomyelitis, right ankle and foot: Secondary | ICD-10-CM | POA: Diagnosis not present

## 2020-11-25 DIAGNOSIS — Z4781 Encounter for orthopedic aftercare following surgical amputation: Secondary | ICD-10-CM | POA: Diagnosis not present

## 2020-11-25 DIAGNOSIS — Z89431 Acquired absence of right foot: Secondary | ICD-10-CM | POA: Diagnosis not present

## 2020-11-25 DIAGNOSIS — K922 Gastrointestinal hemorrhage, unspecified: Secondary | ICD-10-CM | POA: Diagnosis not present

## 2020-11-25 DIAGNOSIS — R69 Illness, unspecified: Secondary | ICD-10-CM | POA: Diagnosis not present

## 2020-11-25 DIAGNOSIS — Z4801 Encounter for change or removal of surgical wound dressing: Secondary | ICD-10-CM | POA: Diagnosis not present

## 2020-11-25 DIAGNOSIS — E1142 Type 2 diabetes mellitus with diabetic polyneuropathy: Secondary | ICD-10-CM | POA: Diagnosis not present

## 2020-11-25 DIAGNOSIS — E1169 Type 2 diabetes mellitus with other specified complication: Secondary | ICD-10-CM | POA: Diagnosis not present

## 2020-11-25 DIAGNOSIS — D500 Iron deficiency anemia secondary to blood loss (chronic): Secondary | ICD-10-CM | POA: Diagnosis not present

## 2020-11-25 DIAGNOSIS — E1151 Type 2 diabetes mellitus with diabetic peripheral angiopathy without gangrene: Secondary | ICD-10-CM | POA: Diagnosis not present

## 2020-11-28 DIAGNOSIS — D500 Iron deficiency anemia secondary to blood loss (chronic): Secondary | ICD-10-CM | POA: Diagnosis not present

## 2020-11-28 DIAGNOSIS — R69 Illness, unspecified: Secondary | ICD-10-CM | POA: Diagnosis not present

## 2020-11-28 DIAGNOSIS — E1142 Type 2 diabetes mellitus with diabetic polyneuropathy: Secondary | ICD-10-CM | POA: Diagnosis not present

## 2020-11-28 DIAGNOSIS — K922 Gastrointestinal hemorrhage, unspecified: Secondary | ICD-10-CM | POA: Diagnosis not present

## 2020-11-28 DIAGNOSIS — Z89431 Acquired absence of right foot: Secondary | ICD-10-CM | POA: Diagnosis not present

## 2020-11-28 DIAGNOSIS — E1151 Type 2 diabetes mellitus with diabetic peripheral angiopathy without gangrene: Secondary | ICD-10-CM | POA: Diagnosis not present

## 2020-11-28 DIAGNOSIS — Z4801 Encounter for change or removal of surgical wound dressing: Secondary | ICD-10-CM | POA: Diagnosis not present

## 2020-11-28 DIAGNOSIS — Z4781 Encounter for orthopedic aftercare following surgical amputation: Secondary | ICD-10-CM | POA: Diagnosis not present

## 2020-11-28 DIAGNOSIS — M86171 Other acute osteomyelitis, right ankle and foot: Secondary | ICD-10-CM | POA: Diagnosis not present

## 2020-11-28 DIAGNOSIS — E1169 Type 2 diabetes mellitus with other specified complication: Secondary | ICD-10-CM | POA: Diagnosis not present

## 2020-11-30 DIAGNOSIS — E1151 Type 2 diabetes mellitus with diabetic peripheral angiopathy without gangrene: Secondary | ICD-10-CM | POA: Diagnosis not present

## 2020-11-30 DIAGNOSIS — E1169 Type 2 diabetes mellitus with other specified complication: Secondary | ICD-10-CM | POA: Diagnosis not present

## 2020-11-30 DIAGNOSIS — D500 Iron deficiency anemia secondary to blood loss (chronic): Secondary | ICD-10-CM | POA: Diagnosis not present

## 2020-11-30 DIAGNOSIS — Z4801 Encounter for change or removal of surgical wound dressing: Secondary | ICD-10-CM | POA: Diagnosis not present

## 2020-11-30 DIAGNOSIS — K922 Gastrointestinal hemorrhage, unspecified: Secondary | ICD-10-CM | POA: Diagnosis not present

## 2020-11-30 DIAGNOSIS — Z4781 Encounter for orthopedic aftercare following surgical amputation: Secondary | ICD-10-CM | POA: Diagnosis not present

## 2020-11-30 DIAGNOSIS — Z89431 Acquired absence of right foot: Secondary | ICD-10-CM | POA: Diagnosis not present

## 2020-11-30 DIAGNOSIS — E1142 Type 2 diabetes mellitus with diabetic polyneuropathy: Secondary | ICD-10-CM | POA: Diagnosis not present

## 2020-11-30 DIAGNOSIS — R69 Illness, unspecified: Secondary | ICD-10-CM | POA: Diagnosis not present

## 2020-11-30 DIAGNOSIS — M86171 Other acute osteomyelitis, right ankle and foot: Secondary | ICD-10-CM | POA: Diagnosis not present

## 2020-12-02 DIAGNOSIS — Z89431 Acquired absence of right foot: Secondary | ICD-10-CM | POA: Diagnosis not present

## 2020-12-02 DIAGNOSIS — E1169 Type 2 diabetes mellitus with other specified complication: Secondary | ICD-10-CM | POA: Diagnosis not present

## 2020-12-02 DIAGNOSIS — Z4781 Encounter for orthopedic aftercare following surgical amputation: Secondary | ICD-10-CM | POA: Diagnosis not present

## 2020-12-02 DIAGNOSIS — R69 Illness, unspecified: Secondary | ICD-10-CM | POA: Diagnosis not present

## 2020-12-02 DIAGNOSIS — M86171 Other acute osteomyelitis, right ankle and foot: Secondary | ICD-10-CM | POA: Diagnosis not present

## 2020-12-02 DIAGNOSIS — Z4801 Encounter for change or removal of surgical wound dressing: Secondary | ICD-10-CM | POA: Diagnosis not present

## 2020-12-02 DIAGNOSIS — E1142 Type 2 diabetes mellitus with diabetic polyneuropathy: Secondary | ICD-10-CM | POA: Diagnosis not present

## 2020-12-02 DIAGNOSIS — E1151 Type 2 diabetes mellitus with diabetic peripheral angiopathy without gangrene: Secondary | ICD-10-CM | POA: Diagnosis not present

## 2020-12-02 DIAGNOSIS — D500 Iron deficiency anemia secondary to blood loss (chronic): Secondary | ICD-10-CM | POA: Diagnosis not present

## 2020-12-02 DIAGNOSIS — K922 Gastrointestinal hemorrhage, unspecified: Secondary | ICD-10-CM | POA: Diagnosis not present

## 2020-12-03 DIAGNOSIS — R531 Weakness: Secondary | ICD-10-CM | POA: Diagnosis not present

## 2020-12-03 DIAGNOSIS — T8131XA Disruption of external operation (surgical) wound, not elsewhere classified, initial encounter: Secondary | ICD-10-CM | POA: Diagnosis not present

## 2020-12-05 DIAGNOSIS — E1142 Type 2 diabetes mellitus with diabetic polyneuropathy: Secondary | ICD-10-CM | POA: Diagnosis not present

## 2020-12-05 DIAGNOSIS — M86171 Other acute osteomyelitis, right ankle and foot: Secondary | ICD-10-CM | POA: Diagnosis not present

## 2020-12-05 DIAGNOSIS — D500 Iron deficiency anemia secondary to blood loss (chronic): Secondary | ICD-10-CM | POA: Diagnosis not present

## 2020-12-05 DIAGNOSIS — Z4781 Encounter for orthopedic aftercare following surgical amputation: Secondary | ICD-10-CM | POA: Diagnosis not present

## 2020-12-05 DIAGNOSIS — E1169 Type 2 diabetes mellitus with other specified complication: Secondary | ICD-10-CM | POA: Diagnosis not present

## 2020-12-05 DIAGNOSIS — R69 Illness, unspecified: Secondary | ICD-10-CM | POA: Diagnosis not present

## 2020-12-05 DIAGNOSIS — K922 Gastrointestinal hemorrhage, unspecified: Secondary | ICD-10-CM | POA: Diagnosis not present

## 2020-12-05 DIAGNOSIS — Z4801 Encounter for change or removal of surgical wound dressing: Secondary | ICD-10-CM | POA: Diagnosis not present

## 2020-12-05 DIAGNOSIS — Z89431 Acquired absence of right foot: Secondary | ICD-10-CM | POA: Diagnosis not present

## 2020-12-05 DIAGNOSIS — E1151 Type 2 diabetes mellitus with diabetic peripheral angiopathy without gangrene: Secondary | ICD-10-CM | POA: Diagnosis not present

## 2020-12-06 ENCOUNTER — Ambulatory Visit (INDEPENDENT_AMBULATORY_CARE_PROVIDER_SITE_OTHER): Payer: Medicare HMO | Admitting: Internal Medicine

## 2020-12-06 ENCOUNTER — Encounter: Payer: Self-pay | Admitting: Internal Medicine

## 2020-12-06 ENCOUNTER — Other Ambulatory Visit: Payer: Self-pay

## 2020-12-06 ENCOUNTER — Other Ambulatory Visit (INDEPENDENT_AMBULATORY_CARE_PROVIDER_SITE_OTHER): Payer: Medicare HMO

## 2020-12-06 VITALS — BP 128/76 | HR 75 | Ht 71.0 in | Wt 245.0 lb

## 2020-12-06 DIAGNOSIS — D509 Iron deficiency anemia, unspecified: Secondary | ICD-10-CM

## 2020-12-06 DIAGNOSIS — K5520 Angiodysplasia of colon without hemorrhage: Secondary | ICD-10-CM | POA: Diagnosis not present

## 2020-12-06 LAB — CBC WITH DIFFERENTIAL/PLATELET
Basophils Absolute: 0.1 10*3/uL (ref 0.0–0.1)
Basophils Relative: 1.6 % (ref 0.0–3.0)
Eosinophils Absolute: 0.4 10*3/uL (ref 0.0–0.7)
Eosinophils Relative: 6.7 % — ABNORMAL HIGH (ref 0.0–5.0)
HCT: 35.5 % — ABNORMAL LOW (ref 39.0–52.0)
Hemoglobin: 10.6 g/dL — ABNORMAL LOW (ref 13.0–17.0)
Lymphocytes Relative: 19.2 % (ref 12.0–46.0)
Lymphs Abs: 1.1 10*3/uL (ref 0.7–4.0)
MCHC: 30 g/dL (ref 30.0–36.0)
MCV: 72.3 fl — ABNORMAL LOW (ref 78.0–100.0)
Monocytes Absolute: 0.5 10*3/uL (ref 0.1–1.0)
Monocytes Relative: 8.5 % (ref 3.0–12.0)
Neutro Abs: 3.6 10*3/uL (ref 1.4–7.7)
Neutrophils Relative %: 64 % (ref 43.0–77.0)
Platelets: 221 10*3/uL (ref 150.0–400.0)
RBC: 4.91 Mil/uL (ref 4.22–5.81)
RDW: 28.3 % — ABNORMAL HIGH (ref 11.5–15.5)
WBC: 5.7 10*3/uL (ref 4.0–10.5)

## 2020-12-06 LAB — FERRITIN: Ferritin: 16.1 ng/mL — ABNORMAL LOW (ref 22.0–322.0)

## 2020-12-06 LAB — IBC PANEL
Iron: 38 ug/dL — ABNORMAL LOW (ref 42–165)
Saturation Ratios: 9 % — ABNORMAL LOW (ref 20.0–50.0)
TIBC: 421.4 ug/dL (ref 250.0–450.0)
Transferrin: 301 mg/dL (ref 212.0–360.0)

## 2020-12-06 MED ORDER — FERROUS SULFATE 325 (65 FE) MG PO TABS: 325.0000 mg | ORAL_TABLET | Freq: Every day | ORAL | 1 refills | Status: AC

## 2020-12-06 NOTE — Patient Instructions (Signed)
If you are age 69 or older, your body mass index should be between 23-30. Your Body mass index is 34.17 kg/m. If this is out of the aforementioned range listed, please consider follow up with your Primary Care Provider.  If you are age 26 or younger, your body mass index should be between 19-25. Your Body mass index is 34.17 kg/m. If this is out of the aformentioned range listed, please consider follow up with your Primary Care Provider.   Your provider has requested that you go to the basement level for lab work before leaving today. Press "B" on the elevator. The lab is located at the first door on the left as you exit the elevator.  The [place] [facility] providers would like to encourage you to use [facility] to communicate with providers for non-urgent requests or questions.  Due to long hold times on the telephone, sending your provider a message by [facility] may be a faster and more efficient way to get a response.  Please allow 48 business hours for a response.  Please remember that this is for non-urgent requests.   Due to recent changes in healthcare laws, you may see the results of your imaging and laboratory studies on MyChart before your provider has had a chance to review them.  We understand that in some cases there may be results that are confusing or concerning to you. Not all laboratory results come back in the same time frame and the provider may be waiting for multiple results in order to interpret others.  Please give [place] 48 hours in order for your provider to thoroughly review all the results before contacting the office for clarification of your results.   It was a pleasure to see you today!  Thank you for trusting me with your gastrointestinal care!    Norwood Levo, MD

## 2020-12-06 NOTE — Progress Notes (Signed)
Chief Complaint: IDA  HPI : 70 year old male with history of DM presented with hospital follow up for IDA and melena.   He was recently admitted 10/11-10/21/22 for osteomyelitis.  At that time he was noted to have a hemoglobin drop from 13.3 to 6.1 upon arrival.  Ferritin was 5, confirming IDA.  His hemoglobin responded appropriately to blood transfusions (4 U pRBCs).  He had an EGD performed in the hospital that showed several small bowel AVMs that were treated with APC and 1 clip.  He was able to be discharged on iron supplementation.   He states that since he was discharged from the hospital, he has been feeling well.  The surgery really helped with his energy levels.  He feels like he has some of his strength back.  Denies any hematochezia or melena.  Denies abdominal pain.  He has been able to eat well.  Denies any lightheadedness or shortness of breath.  He has been taking his iron supplements as prescribed.  He had a colonoscopy many years ago but cannot recall what the results were.    Past Medical History:  Diagnosis Date   Arthritis    Cellulitis of right lower extremity 05/09/2014   Diabetes mellitus without complication (HCC)    Diabetic foot ulcer (HCC) 07/07/2014   Status post bedside debridement of multiple right plantar ulcers by podiatrist, Dr. Reynolds Bowl.   Hypercholesterolemia    IDA (iron deficiency anemia)    Maggot infestation    right foot ulcer   Obesity 05/09/2014   Tobacco abuse 07/07/2014     Past Surgical History:  Procedure Laterality Date   APPENDECTOMY     APPLICATION OF WOUND VAC Right 10/27/2020   Procedure: APPLICATION OF WOUND VAC;  Surgeon: Nada Libman, MD;  Location: [facility] OR;  Service: Vascular;  Laterality: Right;   ESOPHAGOGASTRODUODENOSCOPY (EGD) WITH PROPOFOL N/A 10/28/2020   Procedure: ESOPHAGOGASTRODUODENOSCOPY (EGD) WITH PROPOFOL;  Surgeon: [name], MD;  Location: [facility] ENDOSCOPY;  Service: Gastroenterology;  Laterality: N/A;   HEMOSTASIS  CLIP PLACEMENT  10/28/2020   Procedure: HEMOSTASIS CLIP PLACEMENT;  Surgeon: [name], MD;  Location: [facility] ENDOSCOPY;  Service: Gastroenterology;;   HOT HEMOSTASIS N/A 10/28/2020   Procedure: HOT HEMOSTASIS (ARGON PLASMA COAGULATION/BICAP);  Surgeon: [name], MD;  Location: [facility] ENDOSCOPY;  Service: Gastroenterology;  Laterality: N/A;   PERIPHERAL VASCULAR CATHETERIZATION N/A 07/22/2015   Procedure: Abdominal Aortogram w/Lower Extremity;  Surgeon: Sherren Kerns, MD;  Location: [facility] INVASIVE CV LAB;  Service: Cardiovascular;  Laterality: N/A;   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 No pertinent family history. Social History   Tobacco Use   Smoking status: Every Day    Packs/day: 1.50    Types: Cigarettes   Smokeless tobacco: Never  Substance Use Topics   Alcohol use: Yes    Alcohol/week: 0.0 standard drinks    Comment: occ-twice a week beer approx 2   Drug use: No   Current Outpatient Medications  Medication Sig Dispense Refill   aspirin EC 81 MG tablet Take 81 mg by mouth daily.     clotrimazole-betamethasone (LOTRISONE) cream Apply topically.     ferrous sulfate 325 (65 FE) MG tablet Take 1 tablet (325 mg total) by mouth daily with breakfast. 30 tablet 3   furosemide (LASIX) 20 MG tablet Take 1 tablet (20 mg total)  by mouth daily. 30 tablet 0   gabapentin (NEURONTIN) 300 MG capsule Take 1 capsule (300 mg total) by mouth 3 (three) times daily. 90 capsule 0   JARDIANCE 25 MG TABS tablet Take 25 mg by mouth daily.      lovastatin (MEVACOR) 20 MG tablet Take 1 tablet (20 mg total) by mouth at bedtime. (Patient taking differently: Take 20 mg by mouth daily.) 30 tablet 0   NOVOLOG FLEXPEN 100 UNIT/ML FlexPen Inject 15-30 Units into the skin See admin instructions. 15 units with breakfast 30 units with lunch 30 units with  dinner     polyethylene glycol (MIRALAX / GLYCOLAX) 17 g packet Take 17 g by mouth daily. 14 each 0   TRESIBA FLEXTOUCH 200 UNIT/ML SOPN Inject 55 Units into the skin daily.     No current facility-administered medications for this visit.   Allergies  Allergen Reactions   Penicillins Itching and Rash    Has patient had a PCN reaction causing immediate rash, facial/tongue/throat swelling, SOB or lightheadedness with hypotension: Yes Has patient had a PCN reaction causing severe rash involving mucus membranes or skin necrosis: No Has patient had a PCN reaction that required hospitalization No Has patient had a PCN reaction occurring within the last 10 years: No If all of the above answers are "NO", then may proceed with Cephalosporin use. Has tolerate multiple cephalosporins in the past      Review of Systems: All systems reviewed and negative except where noted in HPI.   Physical Exam: BP 128/76   Pulse 75   Ht 5\' 11"  (1.803 m)   Wt 245 lb (111.1 kg)   BMI 34.17 kg/m  Constitutional: Pleasant,well-developed, male in no acute distress. HEENT: Normocephalic and atraumatic. Conjunctivae are normal. No scleral icterus. Cardiovascular: Normal rate, regular rhythm.  Pulmonary/chest: Effort normal and breath sounds normal. No wheezing, rales or rhonchi. Abdominal: Soft, nondistended, nontender. Bowel sounds active throughout. There are no masses palpable. No hepatomegaly. Extremities: Right foot is wrapped Neurological: Alert and oriented to person place and time. Skin: Skin is warm and dry. No rashes noted. Psychiatric: Normal mood and affect. Behavior is normal.  Labs 10/2020: Upon discharge from hospital, Hb 9.7, MCV 74.3.  CT A/P w/contrast 03/18/12: IMPRESSION:  1.  There is mild hydronephrosis and mild hydroureter bilaterally.  2.  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  3.  Significant urinary bladder distention without calcified  calculi  within bladder.  Findings are highly suspicious for bladder  outlet obstruction. Correlation with urology exam is recommended.  4. Mild enlarged prostate gland with indentation of urinary bladder  base.  5.  Bilateral inguinal lymph nodes.  Although may be reactive in  nature lymphoproliferative or metastatic disease cannot be  excluded.  Clinical correlation is necessary.  6.  Significant stool in the right colon and cecum.  No pericecal  inflammation.   EGD 10/28/20: - Normal esophagus. - Erythematous mucosa in the antrum. - Five non-bleeding angioectasias in the duodenum. Treated with argon plasma coagulation (APC). Clip was placed. - No specimens collected.  ASSESSMENT AND PLAN: IDA Hx of small bowel angioectasias Presents for follow-up of IDA.  He has been doing well and has had no recurrent signs of bleeding. He is currently on iron supplementation. His IDA may have been due to SB angioectasias, which were seen on his most recent EGD and treated.  I discussed the risks of benefits of performing a colonoscopy on the patient, which would  complete his work-up for IDA and get him caught up for colon cancer screening.  Patient has decided not to proceed with colonoscopy at this time.  He is okay with not knowing if there is something in his colon (even a cancer) that is contributing to his IDA -Check CBC, iron/TIBC, ferritin -Patient inquired about follow up with vascular surgery, who performed his transmetatarsal amputation. I did not see any surgery clinic follow up scheduled so I messaged Dr. Myra Gianotti to see if he would want to see him in clinic.  Eulah Pont, MD

## 2020-12-07 DIAGNOSIS — E1151 Type 2 diabetes mellitus with diabetic peripheral angiopathy without gangrene: Secondary | ICD-10-CM | POA: Diagnosis not present

## 2020-12-07 DIAGNOSIS — D500 Iron deficiency anemia secondary to blood loss (chronic): Secondary | ICD-10-CM | POA: Diagnosis not present

## 2020-12-07 DIAGNOSIS — Z4801 Encounter for change or removal of surgical wound dressing: Secondary | ICD-10-CM | POA: Diagnosis not present

## 2020-12-07 DIAGNOSIS — M86171 Other acute osteomyelitis, right ankle and foot: Secondary | ICD-10-CM | POA: Diagnosis not present

## 2020-12-07 DIAGNOSIS — E1169 Type 2 diabetes mellitus with other specified complication: Secondary | ICD-10-CM | POA: Diagnosis not present

## 2020-12-07 DIAGNOSIS — Z89431 Acquired absence of right foot: Secondary | ICD-10-CM | POA: Diagnosis not present

## 2020-12-07 DIAGNOSIS — K922 Gastrointestinal hemorrhage, unspecified: Secondary | ICD-10-CM | POA: Diagnosis not present

## 2020-12-07 DIAGNOSIS — R69 Illness, unspecified: Secondary | ICD-10-CM | POA: Diagnosis not present

## 2020-12-07 DIAGNOSIS — E1142 Type 2 diabetes mellitus with diabetic polyneuropathy: Secondary | ICD-10-CM | POA: Diagnosis not present

## 2020-12-07 DIAGNOSIS — Z4781 Encounter for orthopedic aftercare following surgical amputation: Secondary | ICD-10-CM | POA: Diagnosis not present

## 2020-12-09 DIAGNOSIS — E1151 Type 2 diabetes mellitus with diabetic peripheral angiopathy without gangrene: Secondary | ICD-10-CM | POA: Diagnosis not present

## 2020-12-09 DIAGNOSIS — D500 Iron deficiency anemia secondary to blood loss (chronic): Secondary | ICD-10-CM | POA: Diagnosis not present

## 2020-12-09 DIAGNOSIS — R69 Illness, unspecified: Secondary | ICD-10-CM | POA: Diagnosis not present

## 2020-12-09 DIAGNOSIS — Z89431 Acquired absence of right foot: Secondary | ICD-10-CM | POA: Diagnosis not present

## 2020-12-09 DIAGNOSIS — E1142 Type 2 diabetes mellitus with diabetic polyneuropathy: Secondary | ICD-10-CM | POA: Diagnosis not present

## 2020-12-09 DIAGNOSIS — K922 Gastrointestinal hemorrhage, unspecified: Secondary | ICD-10-CM | POA: Diagnosis not present

## 2020-12-09 DIAGNOSIS — Z4801 Encounter for change or removal of surgical wound dressing: Secondary | ICD-10-CM | POA: Diagnosis not present

## 2020-12-09 DIAGNOSIS — Z4781 Encounter for orthopedic aftercare following surgical amputation: Secondary | ICD-10-CM | POA: Diagnosis not present

## 2020-12-09 DIAGNOSIS — E1169 Type 2 diabetes mellitus with other specified complication: Secondary | ICD-10-CM | POA: Diagnosis not present

## 2020-12-09 DIAGNOSIS — M86171 Other acute osteomyelitis, right ankle and foot: Secondary | ICD-10-CM | POA: Diagnosis not present

## 2020-12-12 ENCOUNTER — Ambulatory Visit (INDEPENDENT_AMBULATORY_CARE_PROVIDER_SITE_OTHER): Payer: Medicare HMO | Admitting: Physician Assistant

## 2020-12-12 ENCOUNTER — Other Ambulatory Visit: Payer: Self-pay

## 2020-12-12 VITALS — BP 159/68 | HR 70 | Temp 98.4°F | Resp 20 | Ht 71.0 in | Wt 245.0 lb

## 2020-12-12 DIAGNOSIS — Z89431 Acquired absence of right foot: Secondary | ICD-10-CM

## 2020-12-12 NOTE — Progress Notes (Signed)
  POST OPERATIVE OFFICE NOTE    CC:  F/u for surgery  HPI:  This is a 70 y.o. male who is s/p right TMA on 10/27/2020 by Dr. Myra Gianotti.    Pt returns today for follow up.  Pt states he feels his wound is healing.  He is hopeful for getting the stitches out and hopeful to stop the wound vac.  He states he is at home alone and has HH coming out to change the vac.    Allergies  Allergen Reactions   Penicillins Itching and Rash    Has patient had a PCN reaction causing immediate rash, facial/tongue/throat swelling, SOB or lightheadedness with hypotension: Yes Has patient had a PCN reaction causing severe rash involving mucus membranes or skin necrosis: No Has patient had a PCN reaction that required hospitalization No Has patient had a PCN reaction occurring within the last 10 years: No If all of the above answers are "NO", then may proceed with Cephalosporin use. Has tolerate multiple cephalosporins in the past     Current Outpatient Medications  Medication Sig Dispense Refill   aspirin EC 81 MG tablet Take 81 mg by mouth daily.     clotrimazole-betamethasone (LOTRISONE) cream Apply topically.     ferrous sulfate 325 (65 FE) MG tablet Take 1 tablet (325 mg total) by mouth daily with breakfast. 30 tablet 1   furosemide (LASIX) 20 MG tablet Take 1 tablet (20 mg total) by mouth daily. 30 tablet 0   gabapentin (NEURONTIN) 300 MG capsule Take 1 capsule (300 mg total) by mouth 3 (three) times daily. 90 capsule 0   JARDIANCE 25 MG TABS tablet Take 25 mg by mouth daily.      lovastatin (MEVACOR) 20 MG tablet Take 1 tablet (20 mg total) by mouth at bedtime. (Patient taking differently: Take 20 mg by mouth daily.) 30 tablet 0   NOVOLOG FLEXPEN 100 UNIT/ML FlexPen Inject 15-30 Units into the skin See admin instructions. 15 units with breakfast 30 units with lunch 30 units with dinner     polyethylene glycol (MIRALAX / GLYCOLAX) 17 g packet Take 17 g by mouth daily. 14 each 0   TRESIBA FLEXTOUCH  200 UNIT/ML SOPN Inject 55 Units into the skin daily.     No current facility-administered medications for this visit.     ROS:  See HPI  Physical Exam:  Today's Vitals   12/12/20 1408  BP: (!) 159/68  Pulse: 70  Resp: 20  Temp: 98.4 F (36.9 C)  TempSrc: Temporal  SpO2: 97%  Weight: 245 lb (111.1 kg)  Height: 5\' 11"  (1.803 m)   Body mass index is 34.17 kg/m.   Incision:       Extremities:  palpable right AT pulse    Assessment/Plan:  This is a 70 y.o. male who is s/p: right TMA on 10/27/2020 by Dr. 10/29/2020.  -pt TMA is healing nicely.  The sutures were removed today and a wet to dry dressing put in placed.  HH will put vac on when they come out tomorrow.  Will continue vac for now as he cannot do daily wet to dry dressing changes and does not have help at home.  He will f/u in 4-6 weeks for wound check.     Myra Gianotti, [facility] [phone]   Clinic MD:  [phone]

## 2020-12-14 DIAGNOSIS — K922 Gastrointestinal hemorrhage, unspecified: Secondary | ICD-10-CM | POA: Diagnosis not present

## 2020-12-14 DIAGNOSIS — E1142 Type 2 diabetes mellitus with diabetic polyneuropathy: Secondary | ICD-10-CM | POA: Diagnosis not present

## 2020-12-14 DIAGNOSIS — R69 Illness, unspecified: Secondary | ICD-10-CM | POA: Diagnosis not present

## 2020-12-14 DIAGNOSIS — E1151 Type 2 diabetes mellitus with diabetic peripheral angiopathy without gangrene: Secondary | ICD-10-CM | POA: Diagnosis not present

## 2020-12-14 DIAGNOSIS — Z4781 Encounter for orthopedic aftercare following surgical amputation: Secondary | ICD-10-CM | POA: Diagnosis not present

## 2020-12-14 DIAGNOSIS — Z4801 Encounter for change or removal of surgical wound dressing: Secondary | ICD-10-CM | POA: Diagnosis not present

## 2020-12-14 DIAGNOSIS — M86171 Other acute osteomyelitis, right ankle and foot: Secondary | ICD-10-CM | POA: Diagnosis not present

## 2020-12-14 DIAGNOSIS — D500 Iron deficiency anemia secondary to blood loss (chronic): Secondary | ICD-10-CM | POA: Diagnosis not present

## 2020-12-14 DIAGNOSIS — E1169 Type 2 diabetes mellitus with other specified complication: Secondary | ICD-10-CM | POA: Diagnosis not present

## 2020-12-14 DIAGNOSIS — Z89431 Acquired absence of right foot: Secondary | ICD-10-CM | POA: Diagnosis not present

## 2020-12-16 DIAGNOSIS — R69 Illness, unspecified: Secondary | ICD-10-CM | POA: Diagnosis not present

## 2020-12-16 DIAGNOSIS — K922 Gastrointestinal hemorrhage, unspecified: Secondary | ICD-10-CM | POA: Diagnosis not present

## 2020-12-16 DIAGNOSIS — E1169 Type 2 diabetes mellitus with other specified complication: Secondary | ICD-10-CM | POA: Diagnosis not present

## 2020-12-16 DIAGNOSIS — E1142 Type 2 diabetes mellitus with diabetic polyneuropathy: Secondary | ICD-10-CM | POA: Diagnosis not present

## 2020-12-16 DIAGNOSIS — D500 Iron deficiency anemia secondary to blood loss (chronic): Secondary | ICD-10-CM | POA: Diagnosis not present

## 2020-12-16 DIAGNOSIS — Z4801 Encounter for change or removal of surgical wound dressing: Secondary | ICD-10-CM | POA: Diagnosis not present

## 2020-12-16 DIAGNOSIS — Z4781 Encounter for orthopedic aftercare following surgical amputation: Secondary | ICD-10-CM | POA: Diagnosis not present

## 2020-12-16 DIAGNOSIS — Z89431 Acquired absence of right foot: Secondary | ICD-10-CM | POA: Diagnosis not present

## 2020-12-16 DIAGNOSIS — E1151 Type 2 diabetes mellitus with diabetic peripheral angiopathy without gangrene: Secondary | ICD-10-CM | POA: Diagnosis not present

## 2020-12-16 DIAGNOSIS — M86171 Other acute osteomyelitis, right ankle and foot: Secondary | ICD-10-CM | POA: Diagnosis not present

## 2020-12-19 DIAGNOSIS — Z4801 Encounter for change or removal of surgical wound dressing: Secondary | ICD-10-CM | POA: Diagnosis not present

## 2020-12-19 DIAGNOSIS — E1169 Type 2 diabetes mellitus with other specified complication: Secondary | ICD-10-CM | POA: Diagnosis not present

## 2020-12-19 DIAGNOSIS — Z4781 Encounter for orthopedic aftercare following surgical amputation: Secondary | ICD-10-CM | POA: Diagnosis not present

## 2020-12-19 DIAGNOSIS — E1142 Type 2 diabetes mellitus with diabetic polyneuropathy: Secondary | ICD-10-CM | POA: Diagnosis not present

## 2020-12-19 DIAGNOSIS — Z89431 Acquired absence of right foot: Secondary | ICD-10-CM | POA: Diagnosis not present

## 2020-12-19 DIAGNOSIS — K922 Gastrointestinal hemorrhage, unspecified: Secondary | ICD-10-CM | POA: Diagnosis not present

## 2020-12-19 DIAGNOSIS — R69 Illness, unspecified: Secondary | ICD-10-CM | POA: Diagnosis not present

## 2020-12-19 DIAGNOSIS — D500 Iron deficiency anemia secondary to blood loss (chronic): Secondary | ICD-10-CM | POA: Diagnosis not present

## 2020-12-19 DIAGNOSIS — M86171 Other acute osteomyelitis, right ankle and foot: Secondary | ICD-10-CM | POA: Diagnosis not present

## 2020-12-19 DIAGNOSIS — E1151 Type 2 diabetes mellitus with diabetic peripheral angiopathy without gangrene: Secondary | ICD-10-CM | POA: Diagnosis not present

## 2020-12-21 DIAGNOSIS — Z89431 Acquired absence of right foot: Secondary | ICD-10-CM | POA: Diagnosis not present

## 2020-12-21 DIAGNOSIS — E1142 Type 2 diabetes mellitus with diabetic polyneuropathy: Secondary | ICD-10-CM | POA: Diagnosis not present

## 2020-12-21 DIAGNOSIS — K922 Gastrointestinal hemorrhage, unspecified: Secondary | ICD-10-CM | POA: Diagnosis not present

## 2020-12-21 DIAGNOSIS — R69 Illness, unspecified: Secondary | ICD-10-CM | POA: Diagnosis not present

## 2020-12-21 DIAGNOSIS — Z4781 Encounter for orthopedic aftercare following surgical amputation: Secondary | ICD-10-CM | POA: Diagnosis not present

## 2020-12-21 DIAGNOSIS — M86171 Other acute osteomyelitis, right ankle and foot: Secondary | ICD-10-CM | POA: Diagnosis not present

## 2020-12-21 DIAGNOSIS — D500 Iron deficiency anemia secondary to blood loss (chronic): Secondary | ICD-10-CM | POA: Diagnosis not present

## 2020-12-21 DIAGNOSIS — E1169 Type 2 diabetes mellitus with other specified complication: Secondary | ICD-10-CM | POA: Diagnosis not present

## 2020-12-21 DIAGNOSIS — Z4801 Encounter for change or removal of surgical wound dressing: Secondary | ICD-10-CM | POA: Diagnosis not present

## 2020-12-21 DIAGNOSIS — E1151 Type 2 diabetes mellitus with diabetic peripheral angiopathy without gangrene: Secondary | ICD-10-CM | POA: Diagnosis not present

## 2020-12-23 DIAGNOSIS — E1151 Type 2 diabetes mellitus with diabetic peripheral angiopathy without gangrene: Secondary | ICD-10-CM | POA: Diagnosis not present

## 2020-12-23 DIAGNOSIS — R69 Illness, unspecified: Secondary | ICD-10-CM | POA: Diagnosis not present

## 2020-12-23 DIAGNOSIS — M86171 Other acute osteomyelitis, right ankle and foot: Secondary | ICD-10-CM | POA: Diagnosis not present

## 2020-12-23 DIAGNOSIS — K922 Gastrointestinal hemorrhage, unspecified: Secondary | ICD-10-CM | POA: Diagnosis not present

## 2020-12-23 DIAGNOSIS — Z4801 Encounter for change or removal of surgical wound dressing: Secondary | ICD-10-CM | POA: Diagnosis not present

## 2020-12-23 DIAGNOSIS — E1142 Type 2 diabetes mellitus with diabetic polyneuropathy: Secondary | ICD-10-CM | POA: Diagnosis not present

## 2020-12-23 DIAGNOSIS — E1169 Type 2 diabetes mellitus with other specified complication: Secondary | ICD-10-CM | POA: Diagnosis not present

## 2020-12-23 DIAGNOSIS — Z89431 Acquired absence of right foot: Secondary | ICD-10-CM | POA: Diagnosis not present

## 2020-12-23 DIAGNOSIS — D500 Iron deficiency anemia secondary to blood loss (chronic): Secondary | ICD-10-CM | POA: Diagnosis not present

## 2020-12-23 DIAGNOSIS — Z4781 Encounter for orthopedic aftercare following surgical amputation: Secondary | ICD-10-CM | POA: Diagnosis not present

## 2020-12-26 DIAGNOSIS — E1151 Type 2 diabetes mellitus with diabetic peripheral angiopathy without gangrene: Secondary | ICD-10-CM | POA: Diagnosis not present

## 2020-12-26 DIAGNOSIS — K922 Gastrointestinal hemorrhage, unspecified: Secondary | ICD-10-CM | POA: Diagnosis not present

## 2020-12-26 DIAGNOSIS — Z4801 Encounter for change or removal of surgical wound dressing: Secondary | ICD-10-CM | POA: Diagnosis not present

## 2020-12-26 DIAGNOSIS — Z4781 Encounter for orthopedic aftercare following surgical amputation: Secondary | ICD-10-CM | POA: Diagnosis not present

## 2020-12-26 DIAGNOSIS — D500 Iron deficiency anemia secondary to blood loss (chronic): Secondary | ICD-10-CM | POA: Diagnosis not present

## 2020-12-26 DIAGNOSIS — R69 Illness, unspecified: Secondary | ICD-10-CM | POA: Diagnosis not present

## 2020-12-26 DIAGNOSIS — E1142 Type 2 diabetes mellitus with diabetic polyneuropathy: Secondary | ICD-10-CM | POA: Diagnosis not present

## 2020-12-26 DIAGNOSIS — Z89431 Acquired absence of right foot: Secondary | ICD-10-CM | POA: Diagnosis not present

## 2020-12-26 DIAGNOSIS — M86171 Other acute osteomyelitis, right ankle and foot: Secondary | ICD-10-CM | POA: Diagnosis not present

## 2020-12-26 DIAGNOSIS — E1169 Type 2 diabetes mellitus with other specified complication: Secondary | ICD-10-CM | POA: Diagnosis not present

## 2020-12-28 DIAGNOSIS — R69 Illness, unspecified: Secondary | ICD-10-CM | POA: Diagnosis not present

## 2020-12-28 DIAGNOSIS — E1142 Type 2 diabetes mellitus with diabetic polyneuropathy: Secondary | ICD-10-CM | POA: Diagnosis not present

## 2020-12-28 DIAGNOSIS — Z4781 Encounter for orthopedic aftercare following surgical amputation: Secondary | ICD-10-CM | POA: Diagnosis not present

## 2020-12-28 DIAGNOSIS — Z4801 Encounter for change or removal of surgical wound dressing: Secondary | ICD-10-CM | POA: Diagnosis not present

## 2020-12-28 DIAGNOSIS — E1169 Type 2 diabetes mellitus with other specified complication: Secondary | ICD-10-CM | POA: Diagnosis not present

## 2020-12-28 DIAGNOSIS — M86171 Other acute osteomyelitis, right ankle and foot: Secondary | ICD-10-CM | POA: Diagnosis not present

## 2020-12-28 DIAGNOSIS — K922 Gastrointestinal hemorrhage, unspecified: Secondary | ICD-10-CM | POA: Diagnosis not present

## 2020-12-28 DIAGNOSIS — E1151 Type 2 diabetes mellitus with diabetic peripheral angiopathy without gangrene: Secondary | ICD-10-CM | POA: Diagnosis not present

## 2020-12-28 DIAGNOSIS — D500 Iron deficiency anemia secondary to blood loss (chronic): Secondary | ICD-10-CM | POA: Diagnosis not present

## 2020-12-28 DIAGNOSIS — Z89431 Acquired absence of right foot: Secondary | ICD-10-CM | POA: Diagnosis not present

## 2020-12-29 ENCOUNTER — Telehealth: Payer: Self-pay

## 2020-12-29 NOTE — Telephone Encounter (Signed)
Kendell from [facility] calls today to report that patient's right TMA wound is healing very well and is superficial now. He would like to d/c wound vac and use xeroform/or aquacell AG on wound. Discussed with PA, advised this was fine.

## 2020-12-30 DIAGNOSIS — R69 Illness, unspecified: Secondary | ICD-10-CM | POA: Diagnosis not present

## 2020-12-30 DIAGNOSIS — Z4781 Encounter for orthopedic aftercare following surgical amputation: Secondary | ICD-10-CM | POA: Diagnosis not present

## 2020-12-30 DIAGNOSIS — Z89431 Acquired absence of right foot: Secondary | ICD-10-CM | POA: Diagnosis not present

## 2020-12-30 DIAGNOSIS — D500 Iron deficiency anemia secondary to blood loss (chronic): Secondary | ICD-10-CM | POA: Diagnosis not present

## 2020-12-30 DIAGNOSIS — Z4801 Encounter for change or removal of surgical wound dressing: Secondary | ICD-10-CM | POA: Diagnosis not present

## 2020-12-30 DIAGNOSIS — E1151 Type 2 diabetes mellitus with diabetic peripheral angiopathy without gangrene: Secondary | ICD-10-CM | POA: Diagnosis not present

## 2020-12-30 DIAGNOSIS — E1169 Type 2 diabetes mellitus with other specified complication: Secondary | ICD-10-CM | POA: Diagnosis not present

## 2020-12-30 DIAGNOSIS — E1142 Type 2 diabetes mellitus with diabetic polyneuropathy: Secondary | ICD-10-CM | POA: Diagnosis not present

## 2020-12-30 DIAGNOSIS — M86171 Other acute osteomyelitis, right ankle and foot: Secondary | ICD-10-CM | POA: Diagnosis not present

## 2020-12-30 DIAGNOSIS — K922 Gastrointestinal hemorrhage, unspecified: Secondary | ICD-10-CM | POA: Diagnosis not present

## 2021-01-02 DIAGNOSIS — Z4801 Encounter for change or removal of surgical wound dressing: Secondary | ICD-10-CM | POA: Diagnosis not present

## 2021-01-02 DIAGNOSIS — R69 Illness, unspecified: Secondary | ICD-10-CM | POA: Diagnosis not present

## 2021-01-02 DIAGNOSIS — E1142 Type 2 diabetes mellitus with diabetic polyneuropathy: Secondary | ICD-10-CM | POA: Diagnosis not present

## 2021-01-02 DIAGNOSIS — D500 Iron deficiency anemia secondary to blood loss (chronic): Secondary | ICD-10-CM | POA: Diagnosis not present

## 2021-01-02 DIAGNOSIS — E1151 Type 2 diabetes mellitus with diabetic peripheral angiopathy without gangrene: Secondary | ICD-10-CM | POA: Diagnosis not present

## 2021-01-02 DIAGNOSIS — Z4781 Encounter for orthopedic aftercare following surgical amputation: Secondary | ICD-10-CM | POA: Diagnosis not present

## 2021-01-02 DIAGNOSIS — Z89431 Acquired absence of right foot: Secondary | ICD-10-CM | POA: Diagnosis not present

## 2021-01-02 DIAGNOSIS — M86171 Other acute osteomyelitis, right ankle and foot: Secondary | ICD-10-CM | POA: Diagnosis not present

## 2021-01-02 DIAGNOSIS — K922 Gastrointestinal hemorrhage, unspecified: Secondary | ICD-10-CM | POA: Diagnosis not present

## 2021-01-02 DIAGNOSIS — R531 Weakness: Secondary | ICD-10-CM | POA: Diagnosis not present

## 2021-01-02 DIAGNOSIS — E1169 Type 2 diabetes mellitus with other specified complication: Secondary | ICD-10-CM | POA: Diagnosis not present

## 2021-01-04 DIAGNOSIS — E1151 Type 2 diabetes mellitus with diabetic peripheral angiopathy without gangrene: Secondary | ICD-10-CM | POA: Diagnosis not present

## 2021-01-04 DIAGNOSIS — M86171 Other acute osteomyelitis, right ankle and foot: Secondary | ICD-10-CM | POA: Diagnosis not present

## 2021-01-04 DIAGNOSIS — Z4781 Encounter for orthopedic aftercare following surgical amputation: Secondary | ICD-10-CM | POA: Diagnosis not present

## 2021-01-04 DIAGNOSIS — D500 Iron deficiency anemia secondary to blood loss (chronic): Secondary | ICD-10-CM | POA: Diagnosis not present

## 2021-01-04 DIAGNOSIS — Z89431 Acquired absence of right foot: Secondary | ICD-10-CM | POA: Diagnosis not present

## 2021-01-04 DIAGNOSIS — E1169 Type 2 diabetes mellitus with other specified complication: Secondary | ICD-10-CM | POA: Diagnosis not present

## 2021-01-04 DIAGNOSIS — R69 Illness, unspecified: Secondary | ICD-10-CM | POA: Diagnosis not present

## 2021-01-04 DIAGNOSIS — E1142 Type 2 diabetes mellitus with diabetic polyneuropathy: Secondary | ICD-10-CM | POA: Diagnosis not present

## 2021-01-04 DIAGNOSIS — K922 Gastrointestinal hemorrhage, unspecified: Secondary | ICD-10-CM | POA: Diagnosis not present

## 2021-01-04 DIAGNOSIS — E1129 Type 2 diabetes mellitus with other diabetic kidney complication: Secondary | ICD-10-CM | POA: Diagnosis not present

## 2021-01-04 DIAGNOSIS — Z4801 Encounter for change or removal of surgical wound dressing: Secondary | ICD-10-CM | POA: Diagnosis not present

## 2021-01-06 DIAGNOSIS — E1142 Type 2 diabetes mellitus with diabetic polyneuropathy: Secondary | ICD-10-CM | POA: Diagnosis not present

## 2021-01-06 DIAGNOSIS — M86171 Other acute osteomyelitis, right ankle and foot: Secondary | ICD-10-CM | POA: Diagnosis not present

## 2021-01-06 DIAGNOSIS — D500 Iron deficiency anemia secondary to blood loss (chronic): Secondary | ICD-10-CM | POA: Diagnosis not present

## 2021-01-06 DIAGNOSIS — E1169 Type 2 diabetes mellitus with other specified complication: Secondary | ICD-10-CM | POA: Diagnosis not present

## 2021-01-06 DIAGNOSIS — E1151 Type 2 diabetes mellitus with diabetic peripheral angiopathy without gangrene: Secondary | ICD-10-CM | POA: Diagnosis not present

## 2021-01-06 DIAGNOSIS — Z4801 Encounter for change or removal of surgical wound dressing: Secondary | ICD-10-CM | POA: Diagnosis not present

## 2021-01-06 DIAGNOSIS — Z89431 Acquired absence of right foot: Secondary | ICD-10-CM | POA: Diagnosis not present

## 2021-01-06 DIAGNOSIS — Z4781 Encounter for orthopedic aftercare following surgical amputation: Secondary | ICD-10-CM | POA: Diagnosis not present

## 2021-01-06 DIAGNOSIS — R69 Illness, unspecified: Secondary | ICD-10-CM | POA: Diagnosis not present

## 2021-01-06 DIAGNOSIS — K922 Gastrointestinal hemorrhage, unspecified: Secondary | ICD-10-CM | POA: Diagnosis not present

## 2021-01-10 DIAGNOSIS — M86171 Other acute osteomyelitis, right ankle and foot: Secondary | ICD-10-CM | POA: Diagnosis not present

## 2021-01-10 DIAGNOSIS — Z4781 Encounter for orthopedic aftercare following surgical amputation: Secondary | ICD-10-CM | POA: Diagnosis not present

## 2021-01-10 DIAGNOSIS — R69 Illness, unspecified: Secondary | ICD-10-CM | POA: Diagnosis not present

## 2021-01-10 DIAGNOSIS — K922 Gastrointestinal hemorrhage, unspecified: Secondary | ICD-10-CM | POA: Diagnosis not present

## 2021-01-10 DIAGNOSIS — D500 Iron deficiency anemia secondary to blood loss (chronic): Secondary | ICD-10-CM | POA: Diagnosis not present

## 2021-01-10 DIAGNOSIS — E1169 Type 2 diabetes mellitus with other specified complication: Secondary | ICD-10-CM | POA: Diagnosis not present

## 2021-01-10 DIAGNOSIS — Z89431 Acquired absence of right foot: Secondary | ICD-10-CM | POA: Diagnosis not present

## 2021-01-10 DIAGNOSIS — E1151 Type 2 diabetes mellitus with diabetic peripheral angiopathy without gangrene: Secondary | ICD-10-CM | POA: Diagnosis not present

## 2021-01-10 DIAGNOSIS — Z4801 Encounter for change or removal of surgical wound dressing: Secondary | ICD-10-CM | POA: Diagnosis not present

## 2021-01-10 DIAGNOSIS — E1142 Type 2 diabetes mellitus with diabetic polyneuropathy: Secondary | ICD-10-CM | POA: Diagnosis not present

## 2021-01-13 DIAGNOSIS — D500 Iron deficiency anemia secondary to blood loss (chronic): Secondary | ICD-10-CM | POA: Diagnosis not present

## 2021-01-13 DIAGNOSIS — Z4801 Encounter for change or removal of surgical wound dressing: Secondary | ICD-10-CM | POA: Diagnosis not present

## 2021-01-13 DIAGNOSIS — K922 Gastrointestinal hemorrhage, unspecified: Secondary | ICD-10-CM | POA: Diagnosis not present

## 2021-01-13 DIAGNOSIS — Z4781 Encounter for orthopedic aftercare following surgical amputation: Secondary | ICD-10-CM | POA: Diagnosis not present

## 2021-01-13 DIAGNOSIS — R69 Illness, unspecified: Secondary | ICD-10-CM | POA: Diagnosis not present

## 2021-01-13 DIAGNOSIS — Z89431 Acquired absence of right foot: Secondary | ICD-10-CM | POA: Diagnosis not present

## 2021-01-13 DIAGNOSIS — E1169 Type 2 diabetes mellitus with other specified complication: Secondary | ICD-10-CM | POA: Diagnosis not present

## 2021-01-13 DIAGNOSIS — E1151 Type 2 diabetes mellitus with diabetic peripheral angiopathy without gangrene: Secondary | ICD-10-CM | POA: Diagnosis not present

## 2021-01-13 DIAGNOSIS — E1142 Type 2 diabetes mellitus with diabetic polyneuropathy: Secondary | ICD-10-CM | POA: Diagnosis not present

## 2021-01-13 DIAGNOSIS — M86171 Other acute osteomyelitis, right ankle and foot: Secondary | ICD-10-CM | POA: Diagnosis not present

## 2021-01-17 DIAGNOSIS — Z4781 Encounter for orthopedic aftercare following surgical amputation: Secondary | ICD-10-CM | POA: Diagnosis not present

## 2021-01-17 DIAGNOSIS — E1169 Type 2 diabetes mellitus with other specified complication: Secondary | ICD-10-CM | POA: Diagnosis not present

## 2021-01-17 DIAGNOSIS — M86171 Other acute osteomyelitis, right ankle and foot: Secondary | ICD-10-CM | POA: Diagnosis not present

## 2021-01-17 DIAGNOSIS — E1151 Type 2 diabetes mellitus with diabetic peripheral angiopathy without gangrene: Secondary | ICD-10-CM | POA: Diagnosis not present

## 2021-01-17 DIAGNOSIS — K922 Gastrointestinal hemorrhage, unspecified: Secondary | ICD-10-CM | POA: Diagnosis not present

## 2021-01-17 DIAGNOSIS — E1142 Type 2 diabetes mellitus with diabetic polyneuropathy: Secondary | ICD-10-CM | POA: Diagnosis not present

## 2021-01-17 DIAGNOSIS — Z89431 Acquired absence of right foot: Secondary | ICD-10-CM | POA: Diagnosis not present

## 2021-01-17 DIAGNOSIS — Z4801 Encounter for change or removal of surgical wound dressing: Secondary | ICD-10-CM | POA: Diagnosis not present

## 2021-01-17 DIAGNOSIS — R69 Illness, unspecified: Secondary | ICD-10-CM | POA: Diagnosis not present

## 2021-01-17 DIAGNOSIS — D500 Iron deficiency anemia secondary to blood loss (chronic): Secondary | ICD-10-CM | POA: Diagnosis not present

## 2021-01-19 DIAGNOSIS — I10 Essential (primary) hypertension: Secondary | ICD-10-CM | POA: Diagnosis not present

## 2021-01-19 DIAGNOSIS — Z299 Encounter for prophylactic measures, unspecified: Secondary | ICD-10-CM | POA: Diagnosis not present

## 2021-01-19 DIAGNOSIS — R69 Illness, unspecified: Secondary | ICD-10-CM | POA: Diagnosis not present

## 2021-01-19 DIAGNOSIS — Z6837 Body mass index (BMI) 37.0-37.9, adult: Secondary | ICD-10-CM | POA: Diagnosis not present

## 2021-01-19 DIAGNOSIS — E1165 Type 2 diabetes mellitus with hyperglycemia: Secondary | ICD-10-CM | POA: Diagnosis not present

## 2021-01-19 DIAGNOSIS — S98911S Complete traumatic amputation of right foot, level unspecified, sequela: Secondary | ICD-10-CM | POA: Diagnosis not present

## 2021-01-19 DIAGNOSIS — Z6841 Body Mass Index (BMI) 40.0 and over, adult: Secondary | ICD-10-CM | POA: Diagnosis not present

## 2021-01-19 DIAGNOSIS — F17210 Nicotine dependence, cigarettes, uncomplicated: Secondary | ICD-10-CM | POA: Diagnosis not present

## 2021-01-20 DIAGNOSIS — Z4801 Encounter for change or removal of surgical wound dressing: Secondary | ICD-10-CM | POA: Diagnosis not present

## 2021-01-20 DIAGNOSIS — D500 Iron deficiency anemia secondary to blood loss (chronic): Secondary | ICD-10-CM | POA: Diagnosis not present

## 2021-01-20 DIAGNOSIS — E1169 Type 2 diabetes mellitus with other specified complication: Secondary | ICD-10-CM | POA: Diagnosis not present

## 2021-01-20 DIAGNOSIS — E1151 Type 2 diabetes mellitus with diabetic peripheral angiopathy without gangrene: Secondary | ICD-10-CM | POA: Diagnosis not present

## 2021-01-20 DIAGNOSIS — M86171 Other acute osteomyelitis, right ankle and foot: Secondary | ICD-10-CM | POA: Diagnosis not present

## 2021-01-20 DIAGNOSIS — K922 Gastrointestinal hemorrhage, unspecified: Secondary | ICD-10-CM | POA: Diagnosis not present

## 2021-01-20 DIAGNOSIS — E1142 Type 2 diabetes mellitus with diabetic polyneuropathy: Secondary | ICD-10-CM | POA: Diagnosis not present

## 2021-01-20 DIAGNOSIS — Z89431 Acquired absence of right foot: Secondary | ICD-10-CM | POA: Diagnosis not present

## 2021-01-20 DIAGNOSIS — R69 Illness, unspecified: Secondary | ICD-10-CM | POA: Diagnosis not present

## 2021-01-20 DIAGNOSIS — Z4781 Encounter for orthopedic aftercare following surgical amputation: Secondary | ICD-10-CM | POA: Diagnosis not present

## 2021-01-23 ENCOUNTER — Other Ambulatory Visit: Payer: Self-pay

## 2021-01-23 ENCOUNTER — Ambulatory Visit (INDEPENDENT_AMBULATORY_CARE_PROVIDER_SITE_OTHER): Payer: Medicare HMO | Admitting: Physician Assistant

## 2021-01-23 VITALS — BP 142/62 | HR 63 | Temp 97.5°F | Resp 20 | Ht 71.0 in | Wt 245.0 lb

## 2021-01-23 DIAGNOSIS — Z4781 Encounter for orthopedic aftercare following surgical amputation: Secondary | ICD-10-CM | POA: Diagnosis not present

## 2021-01-23 DIAGNOSIS — E1169 Type 2 diabetes mellitus with other specified complication: Secondary | ICD-10-CM | POA: Diagnosis not present

## 2021-01-23 DIAGNOSIS — Z89431 Acquired absence of right foot: Secondary | ICD-10-CM

## 2021-01-23 DIAGNOSIS — Z4801 Encounter for change or removal of surgical wound dressing: Secondary | ICD-10-CM | POA: Diagnosis not present

## 2021-01-23 DIAGNOSIS — E1142 Type 2 diabetes mellitus with diabetic polyneuropathy: Secondary | ICD-10-CM | POA: Diagnosis not present

## 2021-01-23 DIAGNOSIS — M86171 Other acute osteomyelitis, right ankle and foot: Secondary | ICD-10-CM | POA: Diagnosis not present

## 2021-01-23 DIAGNOSIS — R69 Illness, unspecified: Secondary | ICD-10-CM | POA: Diagnosis not present

## 2021-01-23 DIAGNOSIS — D500 Iron deficiency anemia secondary to blood loss (chronic): Secondary | ICD-10-CM | POA: Diagnosis not present

## 2021-01-23 DIAGNOSIS — E1151 Type 2 diabetes mellitus with diabetic peripheral angiopathy without gangrene: Secondary | ICD-10-CM | POA: Diagnosis not present

## 2021-01-23 DIAGNOSIS — K922 Gastrointestinal hemorrhage, unspecified: Secondary | ICD-10-CM | POA: Diagnosis not present

## 2021-01-23 NOTE — Progress Notes (Signed)
POST OPERATIVE OFFICE NOTE    CC:  F/u for surgery  HPI:  This is a 71 y.o. male who is s/p Right TMA 10/27/20 by Dr. Myra Gianotti. This was done due to diabetic foot infection with osteomyelitis and Abscess on MRI of foot. No concerns for arterial perfusion with easily palpable distal pulses. He was discharged with incisional wound VAC. He has had HH RN to assist with wound VAC changes up until mid December when the [facility] RN called to ask if they could switch to  Xeroform/ Aquacell AG dressings.   Today he presents with his son. Since the Wound VAC was discontinued he continues to have [facility] RN to help with wound care and dressing changes. He reports no pain in the TMA. He is still learning to balance with his TMA. He denies any claudication, rest pain or new wounds. He uses a cane to assist with ambulation.  Allergies  Allergen Reactions   Penicillins Itching and Rash    Has patient had a PCN reaction causing immediate rash, facial/tongue/throat swelling, SOB or lightheadedness with hypotension: Yes Has patient had a PCN reaction causing severe rash involving mucus membranes or skin necrosis: No Has patient had a PCN reaction that required hospitalization No Has patient had a PCN reaction occurring within the last 10 years: No If all of the above answers are "NO", then may proceed with Cephalosporin use. Has tolerate multiple cephalosporins in the past     Current Outpatient Medications  Medication Sig Dispense Refill   aspirin EC 81 MG tablet Take 81 mg by mouth daily.     clotrimazole-betamethasone (LOTRISONE) cream Apply topically.     ferrous sulfate 325 (65 FE) MG tablet Take 1 tablet (325 mg total) by mouth daily with breakfast. 30 tablet 1   furosemide (LASIX) 20 MG tablet Take 1 tablet (20 mg total) by mouth daily. 30 tablet 0   gabapentin (NEURONTIN) 300 MG capsule Take 1 capsule (300 mg total) by mouth 3 (three) times daily. 90 capsule 0   JARDIANCE 25 MG TABS tablet Take 25 mg by  mouth daily.      lovastatin (MEVACOR) 20 MG tablet Take 1 tablet (20 mg total) by mouth at bedtime. (Patient taking differently: Take 20 mg by mouth daily.) 30 tablet 0   NOVOLOG FLEXPEN 100 UNIT/ML FlexPen Inject 15-30 Units into the skin See admin instructions. 15 units with breakfast 30 units with lunch 30 units with dinner     polyethylene glycol (MIRALAX / GLYCOLAX) 17 g packet Take 17 g by mouth daily. 14 each 0   TRESIBA FLEXTOUCH 200 UNIT/ML SOPN Inject 55 Units into the skin daily.     No current facility-administered medications for this visit.     ROS:  See HPI  Physical Exam:  Vitals:   01/23/21 1409  BP: (!) 142/62  Pulse: 63  Resp: 20  Temp: (!) 97.5 F (36.4 C)  TempSrc: Temporal  SpO2: 98%  Weight: 245 lb (111.1 kg)  Height: 5\' 11"  (1.803 m)    Incision:  Right TMA almost completely healed. Eschar present along incision line   Extremities:  well perfused and warm. Palpable right DP and PT pulses. BLE edematous with chronic venous stasis changes. Small callus on lateral plantar aspect of TMA Neuro: alert and oriented    Assessment/Plan:  This is a 71 y.o. male who is s/p Right TMA 10/27/20 by Dr. Myra Gianotti. This was done due to diabetic foot infection with osteomyelitis and Abscess on  MRI of foot. No concerns for arterial perfusion with easily palpable distal pulses.  TMA is almost completely healed now. Small eschar present along incision. Patient is weight bearing. He uses cane. No claudication, rest pain or tissue loss. -Patient is okay to shower but advised him not to soak in bath tub.  - Will refer him to Podiatrist to assist with possible insert for his shoe to help with offloading to prevent further issues with wounds as he has small callus developing on lateral aspect of foot -He will follow up as needed if he has any new or concerning symptoms  Karoline Caldwell, PA-C [facility] [phone]  Clinic MD:  Trula Slade

## 2021-01-24 DIAGNOSIS — E1142 Type 2 diabetes mellitus with diabetic polyneuropathy: Secondary | ICD-10-CM | POA: Diagnosis not present

## 2021-01-24 DIAGNOSIS — R69 Illness, unspecified: Secondary | ICD-10-CM | POA: Diagnosis not present

## 2021-01-24 DIAGNOSIS — Z4801 Encounter for change or removal of surgical wound dressing: Secondary | ICD-10-CM | POA: Diagnosis not present

## 2021-01-24 DIAGNOSIS — D500 Iron deficiency anemia secondary to blood loss (chronic): Secondary | ICD-10-CM | POA: Diagnosis not present

## 2021-01-24 DIAGNOSIS — M86171 Other acute osteomyelitis, right ankle and foot: Secondary | ICD-10-CM | POA: Diagnosis not present

## 2021-01-24 DIAGNOSIS — Z89431 Acquired absence of right foot: Secondary | ICD-10-CM | POA: Diagnosis not present

## 2021-01-24 DIAGNOSIS — E1169 Type 2 diabetes mellitus with other specified complication: Secondary | ICD-10-CM | POA: Diagnosis not present

## 2021-01-24 DIAGNOSIS — E1151 Type 2 diabetes mellitus with diabetic peripheral angiopathy without gangrene: Secondary | ICD-10-CM | POA: Diagnosis not present

## 2021-01-24 DIAGNOSIS — Z4781 Encounter for orthopedic aftercare following surgical amputation: Secondary | ICD-10-CM | POA: Diagnosis not present

## 2021-01-24 DIAGNOSIS — K922 Gastrointestinal hemorrhage, unspecified: Secondary | ICD-10-CM | POA: Diagnosis not present

## 2021-01-25 DIAGNOSIS — E1142 Type 2 diabetes mellitus with diabetic polyneuropathy: Secondary | ICD-10-CM | POA: Diagnosis not present

## 2021-01-25 DIAGNOSIS — Z4781 Encounter for orthopedic aftercare following surgical amputation: Secondary | ICD-10-CM | POA: Diagnosis not present

## 2021-01-25 DIAGNOSIS — K922 Gastrointestinal hemorrhage, unspecified: Secondary | ICD-10-CM | POA: Diagnosis not present

## 2021-01-25 DIAGNOSIS — E1151 Type 2 diabetes mellitus with diabetic peripheral angiopathy without gangrene: Secondary | ICD-10-CM | POA: Diagnosis not present

## 2021-01-25 DIAGNOSIS — Z4801 Encounter for change or removal of surgical wound dressing: Secondary | ICD-10-CM | POA: Diagnosis not present

## 2021-01-25 DIAGNOSIS — R69 Illness, unspecified: Secondary | ICD-10-CM | POA: Diagnosis not present

## 2021-01-25 DIAGNOSIS — D500 Iron deficiency anemia secondary to blood loss (chronic): Secondary | ICD-10-CM | POA: Diagnosis not present

## 2021-01-25 DIAGNOSIS — M86171 Other acute osteomyelitis, right ankle and foot: Secondary | ICD-10-CM | POA: Diagnosis not present

## 2021-01-25 DIAGNOSIS — E1169 Type 2 diabetes mellitus with other specified complication: Secondary | ICD-10-CM | POA: Diagnosis not present

## 2021-01-25 DIAGNOSIS — Z89431 Acquired absence of right foot: Secondary | ICD-10-CM | POA: Diagnosis not present

## 2021-01-26 DIAGNOSIS — E1129 Type 2 diabetes mellitus with other diabetic kidney complication: Secondary | ICD-10-CM | POA: Diagnosis not present

## 2021-02-01 DIAGNOSIS — E1142 Type 2 diabetes mellitus with diabetic polyneuropathy: Secondary | ICD-10-CM | POA: Diagnosis not present

## 2021-02-01 DIAGNOSIS — M86171 Other acute osteomyelitis, right ankle and foot: Secondary | ICD-10-CM | POA: Diagnosis not present

## 2021-02-01 DIAGNOSIS — Z89431 Acquired absence of right foot: Secondary | ICD-10-CM | POA: Diagnosis not present

## 2021-02-01 DIAGNOSIS — Z4801 Encounter for change or removal of surgical wound dressing: Secondary | ICD-10-CM | POA: Diagnosis not present

## 2021-02-01 DIAGNOSIS — D500 Iron deficiency anemia secondary to blood loss (chronic): Secondary | ICD-10-CM | POA: Diagnosis not present

## 2021-02-01 DIAGNOSIS — Z4781 Encounter for orthopedic aftercare following surgical amputation: Secondary | ICD-10-CM | POA: Diagnosis not present

## 2021-02-01 DIAGNOSIS — K922 Gastrointestinal hemorrhage, unspecified: Secondary | ICD-10-CM | POA: Diagnosis not present

## 2021-02-01 DIAGNOSIS — R69 Illness, unspecified: Secondary | ICD-10-CM | POA: Diagnosis not present

## 2021-02-01 DIAGNOSIS — E1169 Type 2 diabetes mellitus with other specified complication: Secondary | ICD-10-CM | POA: Diagnosis not present

## 2021-02-01 DIAGNOSIS — E1151 Type 2 diabetes mellitus with diabetic peripheral angiopathy without gangrene: Secondary | ICD-10-CM | POA: Diagnosis not present

## 2021-02-02 DIAGNOSIS — Z4781 Encounter for orthopedic aftercare following surgical amputation: Secondary | ICD-10-CM | POA: Diagnosis not present

## 2021-02-02 DIAGNOSIS — Z89431 Acquired absence of right foot: Secondary | ICD-10-CM | POA: Diagnosis not present

## 2021-02-02 DIAGNOSIS — E1142 Type 2 diabetes mellitus with diabetic polyneuropathy: Secondary | ICD-10-CM | POA: Diagnosis not present

## 2021-02-02 DIAGNOSIS — E1151 Type 2 diabetes mellitus with diabetic peripheral angiopathy without gangrene: Secondary | ICD-10-CM | POA: Diagnosis not present

## 2021-02-02 DIAGNOSIS — M86171 Other acute osteomyelitis, right ankle and foot: Secondary | ICD-10-CM | POA: Diagnosis not present

## 2021-02-02 DIAGNOSIS — E1169 Type 2 diabetes mellitus with other specified complication: Secondary | ICD-10-CM | POA: Diagnosis not present

## 2021-02-02 DIAGNOSIS — D500 Iron deficiency anemia secondary to blood loss (chronic): Secondary | ICD-10-CM | POA: Diagnosis not present

## 2021-02-02 DIAGNOSIS — R69 Illness, unspecified: Secondary | ICD-10-CM | POA: Diagnosis not present

## 2021-02-02 DIAGNOSIS — Z4801 Encounter for change or removal of surgical wound dressing: Secondary | ICD-10-CM | POA: Diagnosis not present

## 2021-02-02 DIAGNOSIS — K922 Gastrointestinal hemorrhage, unspecified: Secondary | ICD-10-CM | POA: Diagnosis not present

## 2021-02-02 DIAGNOSIS — R531 Weakness: Secondary | ICD-10-CM | POA: Diagnosis not present

## 2021-02-03 DIAGNOSIS — Z4801 Encounter for change or removal of surgical wound dressing: Secondary | ICD-10-CM | POA: Diagnosis not present

## 2021-02-03 DIAGNOSIS — Z89431 Acquired absence of right foot: Secondary | ICD-10-CM | POA: Diagnosis not present

## 2021-02-03 DIAGNOSIS — E1142 Type 2 diabetes mellitus with diabetic polyneuropathy: Secondary | ICD-10-CM | POA: Diagnosis not present

## 2021-02-03 DIAGNOSIS — Z4781 Encounter for orthopedic aftercare following surgical amputation: Secondary | ICD-10-CM | POA: Diagnosis not present

## 2021-02-03 DIAGNOSIS — R69 Illness, unspecified: Secondary | ICD-10-CM | POA: Diagnosis not present

## 2021-02-03 DIAGNOSIS — E1169 Type 2 diabetes mellitus with other specified complication: Secondary | ICD-10-CM | POA: Diagnosis not present

## 2021-02-03 DIAGNOSIS — M86171 Other acute osteomyelitis, right ankle and foot: Secondary | ICD-10-CM | POA: Diagnosis not present

## 2021-02-03 DIAGNOSIS — K922 Gastrointestinal hemorrhage, unspecified: Secondary | ICD-10-CM | POA: Diagnosis not present

## 2021-02-03 DIAGNOSIS — D500 Iron deficiency anemia secondary to blood loss (chronic): Secondary | ICD-10-CM | POA: Diagnosis not present

## 2021-02-03 DIAGNOSIS — E1151 Type 2 diabetes mellitus with diabetic peripheral angiopathy without gangrene: Secondary | ICD-10-CM | POA: Diagnosis not present

## 2021-02-06 DIAGNOSIS — E1151 Type 2 diabetes mellitus with diabetic peripheral angiopathy without gangrene: Secondary | ICD-10-CM | POA: Diagnosis not present

## 2021-02-06 DIAGNOSIS — Z89431 Acquired absence of right foot: Secondary | ICD-10-CM | POA: Diagnosis not present

## 2021-02-06 DIAGNOSIS — E1169 Type 2 diabetes mellitus with other specified complication: Secondary | ICD-10-CM | POA: Diagnosis not present

## 2021-02-06 DIAGNOSIS — E1142 Type 2 diabetes mellitus with diabetic polyneuropathy: Secondary | ICD-10-CM | POA: Diagnosis not present

## 2021-02-06 DIAGNOSIS — Z4781 Encounter for orthopedic aftercare following surgical amputation: Secondary | ICD-10-CM | POA: Diagnosis not present

## 2021-02-06 DIAGNOSIS — M86171 Other acute osteomyelitis, right ankle and foot: Secondary | ICD-10-CM | POA: Diagnosis not present

## 2021-02-06 DIAGNOSIS — D500 Iron deficiency anemia secondary to blood loss (chronic): Secondary | ICD-10-CM | POA: Diagnosis not present

## 2021-02-06 DIAGNOSIS — K922 Gastrointestinal hemorrhage, unspecified: Secondary | ICD-10-CM | POA: Diagnosis not present

## 2021-02-06 DIAGNOSIS — Z4801 Encounter for change or removal of surgical wound dressing: Secondary | ICD-10-CM | POA: Diagnosis not present

## 2021-02-06 DIAGNOSIS — R69 Illness, unspecified: Secondary | ICD-10-CM | POA: Diagnosis not present

## 2021-02-09 DIAGNOSIS — E1151 Type 2 diabetes mellitus with diabetic peripheral angiopathy without gangrene: Secondary | ICD-10-CM | POA: Diagnosis not present

## 2021-02-09 DIAGNOSIS — E1142 Type 2 diabetes mellitus with diabetic polyneuropathy: Secondary | ICD-10-CM | POA: Diagnosis not present

## 2021-02-09 DIAGNOSIS — Z4781 Encounter for orthopedic aftercare following surgical amputation: Secondary | ICD-10-CM | POA: Diagnosis not present

## 2021-02-09 DIAGNOSIS — Z89431 Acquired absence of right foot: Secondary | ICD-10-CM | POA: Diagnosis not present

## 2021-02-09 DIAGNOSIS — D500 Iron deficiency anemia secondary to blood loss (chronic): Secondary | ICD-10-CM | POA: Diagnosis not present

## 2021-02-09 DIAGNOSIS — R69 Illness, unspecified: Secondary | ICD-10-CM | POA: Diagnosis not present

## 2021-02-09 DIAGNOSIS — Z4801 Encounter for change or removal of surgical wound dressing: Secondary | ICD-10-CM | POA: Diagnosis not present

## 2021-02-09 DIAGNOSIS — M86171 Other acute osteomyelitis, right ankle and foot: Secondary | ICD-10-CM | POA: Diagnosis not present

## 2021-02-09 DIAGNOSIS — E1169 Type 2 diabetes mellitus with other specified complication: Secondary | ICD-10-CM | POA: Diagnosis not present

## 2021-02-09 DIAGNOSIS — K922 Gastrointestinal hemorrhage, unspecified: Secondary | ICD-10-CM | POA: Diagnosis not present

## 2021-02-13 DIAGNOSIS — K922 Gastrointestinal hemorrhage, unspecified: Secondary | ICD-10-CM | POA: Diagnosis not present

## 2021-02-13 DIAGNOSIS — D500 Iron deficiency anemia secondary to blood loss (chronic): Secondary | ICD-10-CM | POA: Diagnosis not present

## 2021-02-13 DIAGNOSIS — Z89431 Acquired absence of right foot: Secondary | ICD-10-CM | POA: Diagnosis not present

## 2021-02-13 DIAGNOSIS — E1151 Type 2 diabetes mellitus with diabetic peripheral angiopathy without gangrene: Secondary | ICD-10-CM | POA: Diagnosis not present

## 2021-02-13 DIAGNOSIS — R69 Illness, unspecified: Secondary | ICD-10-CM | POA: Diagnosis not present

## 2021-02-13 DIAGNOSIS — E1169 Type 2 diabetes mellitus with other specified complication: Secondary | ICD-10-CM | POA: Diagnosis not present

## 2021-02-13 DIAGNOSIS — M86171 Other acute osteomyelitis, right ankle and foot: Secondary | ICD-10-CM | POA: Diagnosis not present

## 2021-02-13 DIAGNOSIS — Z4801 Encounter for change or removal of surgical wound dressing: Secondary | ICD-10-CM | POA: Diagnosis not present

## 2021-02-13 DIAGNOSIS — Z4781 Encounter for orthopedic aftercare following surgical amputation: Secondary | ICD-10-CM | POA: Diagnosis not present

## 2021-02-13 DIAGNOSIS — E1142 Type 2 diabetes mellitus with diabetic polyneuropathy: Secondary | ICD-10-CM | POA: Diagnosis not present

## 2021-02-20 DIAGNOSIS — D500 Iron deficiency anemia secondary to blood loss (chronic): Secondary | ICD-10-CM | POA: Diagnosis not present

## 2021-02-20 DIAGNOSIS — E1142 Type 2 diabetes mellitus with diabetic polyneuropathy: Secondary | ICD-10-CM | POA: Diagnosis not present

## 2021-02-20 DIAGNOSIS — Z89431 Acquired absence of right foot: Secondary | ICD-10-CM | POA: Diagnosis not present

## 2021-02-20 DIAGNOSIS — E1169 Type 2 diabetes mellitus with other specified complication: Secondary | ICD-10-CM | POA: Diagnosis not present

## 2021-02-20 DIAGNOSIS — M86171 Other acute osteomyelitis, right ankle and foot: Secondary | ICD-10-CM | POA: Diagnosis not present

## 2021-02-20 DIAGNOSIS — E1151 Type 2 diabetes mellitus with diabetic peripheral angiopathy without gangrene: Secondary | ICD-10-CM | POA: Diagnosis not present

## 2021-02-20 DIAGNOSIS — K922 Gastrointestinal hemorrhage, unspecified: Secondary | ICD-10-CM | POA: Diagnosis not present

## 2021-02-20 DIAGNOSIS — R69 Illness, unspecified: Secondary | ICD-10-CM | POA: Diagnosis not present

## 2021-02-20 DIAGNOSIS — Z4781 Encounter for orthopedic aftercare following surgical amputation: Secondary | ICD-10-CM | POA: Diagnosis not present

## 2021-02-20 DIAGNOSIS — Z4801 Encounter for change or removal of surgical wound dressing: Secondary | ICD-10-CM | POA: Diagnosis not present

## 2021-02-27 DIAGNOSIS — D500 Iron deficiency anemia secondary to blood loss (chronic): Secondary | ICD-10-CM | POA: Diagnosis not present

## 2021-02-27 DIAGNOSIS — K922 Gastrointestinal hemorrhage, unspecified: Secondary | ICD-10-CM | POA: Diagnosis not present

## 2021-02-27 DIAGNOSIS — E1142 Type 2 diabetes mellitus with diabetic polyneuropathy: Secondary | ICD-10-CM | POA: Diagnosis not present

## 2021-02-27 DIAGNOSIS — R69 Illness, unspecified: Secondary | ICD-10-CM | POA: Diagnosis not present

## 2021-02-27 DIAGNOSIS — Z4781 Encounter for orthopedic aftercare following surgical amputation: Secondary | ICD-10-CM | POA: Diagnosis not present

## 2021-02-27 DIAGNOSIS — E1169 Type 2 diabetes mellitus with other specified complication: Secondary | ICD-10-CM | POA: Diagnosis not present

## 2021-02-27 DIAGNOSIS — Z89431 Acquired absence of right foot: Secondary | ICD-10-CM | POA: Diagnosis not present

## 2021-02-27 DIAGNOSIS — E1151 Type 2 diabetes mellitus with diabetic peripheral angiopathy without gangrene: Secondary | ICD-10-CM | POA: Diagnosis not present

## 2021-02-27 DIAGNOSIS — M86171 Other acute osteomyelitis, right ankle and foot: Secondary | ICD-10-CM | POA: Diagnosis not present

## 2021-02-27 DIAGNOSIS — Z4801 Encounter for change or removal of surgical wound dressing: Secondary | ICD-10-CM | POA: Diagnosis not present

## 2021-02-28 DIAGNOSIS — E1140 Type 2 diabetes mellitus with diabetic neuropathy, unspecified: Secondary | ICD-10-CM | POA: Diagnosis not present

## 2021-02-28 DIAGNOSIS — L110 Acquired keratosis follicularis: Secondary | ICD-10-CM | POA: Diagnosis not present

## 2021-02-28 DIAGNOSIS — M79671 Pain in right foot: Secondary | ICD-10-CM | POA: Diagnosis not present

## 2021-02-28 DIAGNOSIS — E1151 Type 2 diabetes mellitus with diabetic peripheral angiopathy without gangrene: Secondary | ICD-10-CM | POA: Diagnosis not present

## 2021-02-28 DIAGNOSIS — I739 Peripheral vascular disease, unspecified: Secondary | ICD-10-CM | POA: Diagnosis not present

## 2021-02-28 DIAGNOSIS — M79672 Pain in left foot: Secondary | ICD-10-CM | POA: Diagnosis not present

## 2021-02-28 DIAGNOSIS — M79674 Pain in right toe(s): Secondary | ICD-10-CM | POA: Diagnosis not present

## 2021-02-28 DIAGNOSIS — M79675 Pain in left toe(s): Secondary | ICD-10-CM | POA: Diagnosis not present

## 2021-03-05 DIAGNOSIS — R531 Weakness: Secondary | ICD-10-CM | POA: Diagnosis not present

## 2021-03-14 DIAGNOSIS — L89892 Pressure ulcer of other site, stage 2: Secondary | ICD-10-CM | POA: Diagnosis not present

## 2021-03-14 DIAGNOSIS — M79674 Pain in right toe(s): Secondary | ICD-10-CM | POA: Diagnosis not present

## 2021-03-14 DIAGNOSIS — M79671 Pain in right foot: Secondary | ICD-10-CM | POA: Diagnosis not present

## 2021-03-14 DIAGNOSIS — E1140 Type 2 diabetes mellitus with diabetic neuropathy, unspecified: Secondary | ICD-10-CM | POA: Diagnosis not present

## 2021-03-20 DIAGNOSIS — E785 Hyperlipidemia, unspecified: Secondary | ICD-10-CM | POA: Diagnosis not present

## 2021-03-20 DIAGNOSIS — E1151 Type 2 diabetes mellitus with diabetic peripheral angiopathy without gangrene: Secondary | ICD-10-CM | POA: Diagnosis not present

## 2021-03-20 DIAGNOSIS — E1165 Type 2 diabetes mellitus with hyperglycemia: Secondary | ICD-10-CM | POA: Diagnosis not present

## 2021-03-20 DIAGNOSIS — R69 Illness, unspecified: Secondary | ICD-10-CM | POA: Diagnosis not present

## 2021-03-20 DIAGNOSIS — Z794 Long term (current) use of insulin: Secondary | ICD-10-CM | POA: Diagnosis not present

## 2021-03-20 DIAGNOSIS — E1142 Type 2 diabetes mellitus with diabetic polyneuropathy: Secondary | ICD-10-CM | POA: Diagnosis not present

## 2021-03-20 DIAGNOSIS — Z89431 Acquired absence of right foot: Secondary | ICD-10-CM | POA: Diagnosis not present

## 2021-03-20 DIAGNOSIS — G8929 Other chronic pain: Secondary | ICD-10-CM | POA: Diagnosis not present

## 2021-03-20 DIAGNOSIS — G546 Phantom limb syndrome with pain: Secondary | ICD-10-CM | POA: Diagnosis not present

## 2021-03-20 DIAGNOSIS — M1990 Unspecified osteoarthritis, unspecified site: Secondary | ICD-10-CM | POA: Diagnosis not present

## 2021-03-21 DIAGNOSIS — L0390 Cellulitis, unspecified: Secondary | ICD-10-CM | POA: Diagnosis not present

## 2021-03-21 DIAGNOSIS — I10 Essential (primary) hypertension: Secondary | ICD-10-CM | POA: Diagnosis not present

## 2021-03-21 DIAGNOSIS — S98131A Complete traumatic amputation of one right lesser toe, initial encounter: Secondary | ICD-10-CM | POA: Diagnosis not present

## 2021-03-21 DIAGNOSIS — L0291 Cutaneous abscess, unspecified: Secondary | ICD-10-CM | POA: Diagnosis not present

## 2021-03-21 DIAGNOSIS — Z6838 Body mass index (BMI) 38.0-38.9, adult: Secondary | ICD-10-CM | POA: Diagnosis not present

## 2021-03-21 DIAGNOSIS — Z299 Encounter for prophylactic measures, unspecified: Secondary | ICD-10-CM | POA: Diagnosis not present

## 2021-03-21 DIAGNOSIS — Z794 Long term (current) use of insulin: Secondary | ICD-10-CM | POA: Diagnosis not present

## 2021-03-23 DIAGNOSIS — S98131A Complete traumatic amputation of one right lesser toe, initial encounter: Secondary | ICD-10-CM | POA: Diagnosis not present

## 2021-03-23 DIAGNOSIS — Z602 Problems related to living alone: Secondary | ICD-10-CM | POA: Diagnosis not present

## 2021-03-23 DIAGNOSIS — I8391 Asymptomatic varicose veins of right lower extremity: Secondary | ICD-10-CM | POA: Diagnosis not present

## 2021-03-23 DIAGNOSIS — E1165 Type 2 diabetes mellitus with hyperglycemia: Secondary | ICD-10-CM | POA: Diagnosis not present

## 2021-03-23 DIAGNOSIS — I10 Essential (primary) hypertension: Secondary | ICD-10-CM | POA: Diagnosis not present

## 2021-03-23 DIAGNOSIS — R69 Illness, unspecified: Secondary | ICD-10-CM | POA: Diagnosis not present

## 2021-03-23 DIAGNOSIS — L03115 Cellulitis of right lower limb: Secondary | ICD-10-CM | POA: Diagnosis not present

## 2021-03-23 DIAGNOSIS — Z6838 Body mass index (BMI) 38.0-38.9, adult: Secondary | ICD-10-CM | POA: Diagnosis not present

## 2021-03-23 DIAGNOSIS — Z7984 Long term (current) use of oral hypoglycemic drugs: Secondary | ICD-10-CM | POA: Diagnosis not present

## 2021-03-23 DIAGNOSIS — E1142 Type 2 diabetes mellitus with diabetic polyneuropathy: Secondary | ICD-10-CM | POA: Diagnosis not present

## 2021-03-23 DIAGNOSIS — Z794 Long term (current) use of insulin: Secondary | ICD-10-CM | POA: Diagnosis not present

## 2021-03-23 DIAGNOSIS — T8789 Other complications of amputation stump: Secondary | ICD-10-CM | POA: Diagnosis not present

## 2021-03-23 DIAGNOSIS — L02415 Cutaneous abscess of right lower limb: Secondary | ICD-10-CM | POA: Diagnosis not present

## 2021-03-23 DIAGNOSIS — E1151 Type 2 diabetes mellitus with diabetic peripheral angiopathy without gangrene: Secondary | ICD-10-CM | POA: Diagnosis not present

## 2021-03-27 DIAGNOSIS — E1165 Type 2 diabetes mellitus with hyperglycemia: Secondary | ICD-10-CM | POA: Diagnosis not present

## 2021-03-27 DIAGNOSIS — R69 Illness, unspecified: Secondary | ICD-10-CM | POA: Diagnosis not present

## 2021-03-27 DIAGNOSIS — T8789 Other complications of amputation stump: Secondary | ICD-10-CM | POA: Diagnosis not present

## 2021-03-27 DIAGNOSIS — L03115 Cellulitis of right lower limb: Secondary | ICD-10-CM | POA: Diagnosis not present

## 2021-03-27 DIAGNOSIS — L02415 Cutaneous abscess of right lower limb: Secondary | ICD-10-CM | POA: Diagnosis not present

## 2021-03-27 DIAGNOSIS — I10 Essential (primary) hypertension: Secondary | ICD-10-CM | POA: Diagnosis not present

## 2021-03-27 DIAGNOSIS — E1142 Type 2 diabetes mellitus with diabetic polyneuropathy: Secondary | ICD-10-CM | POA: Diagnosis not present

## 2021-03-27 DIAGNOSIS — I8391 Asymptomatic varicose veins of right lower extremity: Secondary | ICD-10-CM | POA: Diagnosis not present

## 2021-03-27 DIAGNOSIS — E1151 Type 2 diabetes mellitus with diabetic peripheral angiopathy without gangrene: Secondary | ICD-10-CM | POA: Diagnosis not present

## 2021-03-30 DIAGNOSIS — L02415 Cutaneous abscess of right lower limb: Secondary | ICD-10-CM | POA: Diagnosis not present

## 2021-03-30 DIAGNOSIS — E1165 Type 2 diabetes mellitus with hyperglycemia: Secondary | ICD-10-CM | POA: Diagnosis not present

## 2021-03-30 DIAGNOSIS — T8789 Other complications of amputation stump: Secondary | ICD-10-CM | POA: Diagnosis not present

## 2021-03-30 DIAGNOSIS — E1142 Type 2 diabetes mellitus with diabetic polyneuropathy: Secondary | ICD-10-CM | POA: Diagnosis not present

## 2021-03-30 DIAGNOSIS — L03115 Cellulitis of right lower limb: Secondary | ICD-10-CM | POA: Diagnosis not present

## 2021-03-30 DIAGNOSIS — R69 Illness, unspecified: Secondary | ICD-10-CM | POA: Diagnosis not present

## 2021-03-30 DIAGNOSIS — I8391 Asymptomatic varicose veins of right lower extremity: Secondary | ICD-10-CM | POA: Diagnosis not present

## 2021-03-30 DIAGNOSIS — E1151 Type 2 diabetes mellitus with diabetic peripheral angiopathy without gangrene: Secondary | ICD-10-CM | POA: Diagnosis not present

## 2021-03-30 DIAGNOSIS — I10 Essential (primary) hypertension: Secondary | ICD-10-CM | POA: Diagnosis not present

## 2021-04-02 DIAGNOSIS — R531 Weakness: Secondary | ICD-10-CM | POA: Diagnosis not present

## 2021-04-04 DIAGNOSIS — T8789 Other complications of amputation stump: Secondary | ICD-10-CM | POA: Diagnosis not present

## 2021-04-04 DIAGNOSIS — E1142 Type 2 diabetes mellitus with diabetic polyneuropathy: Secondary | ICD-10-CM | POA: Diagnosis not present

## 2021-04-04 DIAGNOSIS — E1151 Type 2 diabetes mellitus with diabetic peripheral angiopathy without gangrene: Secondary | ICD-10-CM | POA: Diagnosis not present

## 2021-04-04 DIAGNOSIS — L02415 Cutaneous abscess of right lower limb: Secondary | ICD-10-CM | POA: Diagnosis not present

## 2021-04-04 DIAGNOSIS — I8391 Asymptomatic varicose veins of right lower extremity: Secondary | ICD-10-CM | POA: Diagnosis not present

## 2021-04-04 DIAGNOSIS — R69 Illness, unspecified: Secondary | ICD-10-CM | POA: Diagnosis not present

## 2021-04-04 DIAGNOSIS — L03115 Cellulitis of right lower limb: Secondary | ICD-10-CM | POA: Diagnosis not present

## 2021-04-04 DIAGNOSIS — E1165 Type 2 diabetes mellitus with hyperglycemia: Secondary | ICD-10-CM | POA: Diagnosis not present

## 2021-04-04 DIAGNOSIS — E1129 Type 2 diabetes mellitus with other diabetic kidney complication: Secondary | ICD-10-CM | POA: Diagnosis not present

## 2021-04-04 DIAGNOSIS — I10 Essential (primary) hypertension: Secondary | ICD-10-CM | POA: Diagnosis not present

## 2021-04-05 DIAGNOSIS — T8789 Other complications of amputation stump: Secondary | ICD-10-CM | POA: Diagnosis not present

## 2021-04-05 DIAGNOSIS — I10 Essential (primary) hypertension: Secondary | ICD-10-CM | POA: Diagnosis not present

## 2021-04-05 DIAGNOSIS — E1142 Type 2 diabetes mellitus with diabetic polyneuropathy: Secondary | ICD-10-CM | POA: Diagnosis not present

## 2021-04-05 DIAGNOSIS — I8391 Asymptomatic varicose veins of right lower extremity: Secondary | ICD-10-CM | POA: Diagnosis not present

## 2021-04-05 DIAGNOSIS — R69 Illness, unspecified: Secondary | ICD-10-CM | POA: Diagnosis not present

## 2021-04-05 DIAGNOSIS — E1151 Type 2 diabetes mellitus with diabetic peripheral angiopathy without gangrene: Secondary | ICD-10-CM | POA: Diagnosis not present

## 2021-04-05 DIAGNOSIS — L03115 Cellulitis of right lower limb: Secondary | ICD-10-CM | POA: Diagnosis not present

## 2021-04-05 DIAGNOSIS — E1165 Type 2 diabetes mellitus with hyperglycemia: Secondary | ICD-10-CM | POA: Diagnosis not present

## 2021-04-05 DIAGNOSIS — L02415 Cutaneous abscess of right lower limb: Secondary | ICD-10-CM | POA: Diagnosis not present

## 2021-04-07 DIAGNOSIS — E1142 Type 2 diabetes mellitus with diabetic polyneuropathy: Secondary | ICD-10-CM | POA: Diagnosis not present

## 2021-04-07 DIAGNOSIS — I10 Essential (primary) hypertension: Secondary | ICD-10-CM | POA: Diagnosis not present

## 2021-04-07 DIAGNOSIS — E1165 Type 2 diabetes mellitus with hyperglycemia: Secondary | ICD-10-CM | POA: Diagnosis not present

## 2021-04-07 DIAGNOSIS — T8789 Other complications of amputation stump: Secondary | ICD-10-CM | POA: Diagnosis not present

## 2021-04-07 DIAGNOSIS — L03115 Cellulitis of right lower limb: Secondary | ICD-10-CM | POA: Diagnosis not present

## 2021-04-07 DIAGNOSIS — R69 Illness, unspecified: Secondary | ICD-10-CM | POA: Diagnosis not present

## 2021-04-07 DIAGNOSIS — L02415 Cutaneous abscess of right lower limb: Secondary | ICD-10-CM | POA: Diagnosis not present

## 2021-04-07 DIAGNOSIS — E1151 Type 2 diabetes mellitus with diabetic peripheral angiopathy without gangrene: Secondary | ICD-10-CM | POA: Diagnosis not present

## 2021-04-07 DIAGNOSIS — I8391 Asymptomatic varicose veins of right lower extremity: Secondary | ICD-10-CM | POA: Diagnosis not present

## 2021-04-10 DIAGNOSIS — E1151 Type 2 diabetes mellitus with diabetic peripheral angiopathy without gangrene: Secondary | ICD-10-CM | POA: Diagnosis not present

## 2021-04-10 DIAGNOSIS — E1165 Type 2 diabetes mellitus with hyperglycemia: Secondary | ICD-10-CM | POA: Diagnosis not present

## 2021-04-10 DIAGNOSIS — I10 Essential (primary) hypertension: Secondary | ICD-10-CM | POA: Diagnosis not present

## 2021-04-10 DIAGNOSIS — L02415 Cutaneous abscess of right lower limb: Secondary | ICD-10-CM | POA: Diagnosis not present

## 2021-04-10 DIAGNOSIS — L03115 Cellulitis of right lower limb: Secondary | ICD-10-CM | POA: Diagnosis not present

## 2021-04-10 DIAGNOSIS — E1142 Type 2 diabetes mellitus with diabetic polyneuropathy: Secondary | ICD-10-CM | POA: Diagnosis not present

## 2021-04-10 DIAGNOSIS — I8391 Asymptomatic varicose veins of right lower extremity: Secondary | ICD-10-CM | POA: Diagnosis not present

## 2021-04-10 DIAGNOSIS — T8789 Other complications of amputation stump: Secondary | ICD-10-CM | POA: Diagnosis not present

## 2021-04-10 DIAGNOSIS — R69 Illness, unspecified: Secondary | ICD-10-CM | POA: Diagnosis not present

## 2021-04-12 DIAGNOSIS — I8391 Asymptomatic varicose veins of right lower extremity: Secondary | ICD-10-CM | POA: Diagnosis not present

## 2021-04-12 DIAGNOSIS — R69 Illness, unspecified: Secondary | ICD-10-CM | POA: Diagnosis not present

## 2021-04-12 DIAGNOSIS — L03115 Cellulitis of right lower limb: Secondary | ICD-10-CM | POA: Diagnosis not present

## 2021-04-12 DIAGNOSIS — E1151 Type 2 diabetes mellitus with diabetic peripheral angiopathy without gangrene: Secondary | ICD-10-CM | POA: Diagnosis not present

## 2021-04-12 DIAGNOSIS — L02415 Cutaneous abscess of right lower limb: Secondary | ICD-10-CM | POA: Diagnosis not present

## 2021-04-12 DIAGNOSIS — T8789 Other complications of amputation stump: Secondary | ICD-10-CM | POA: Diagnosis not present

## 2021-04-12 DIAGNOSIS — E1142 Type 2 diabetes mellitus with diabetic polyneuropathy: Secondary | ICD-10-CM | POA: Diagnosis not present

## 2021-04-12 DIAGNOSIS — I10 Essential (primary) hypertension: Secondary | ICD-10-CM | POA: Diagnosis not present

## 2021-04-12 DIAGNOSIS — E1165 Type 2 diabetes mellitus with hyperglycemia: Secondary | ICD-10-CM | POA: Diagnosis not present

## 2021-04-13 DIAGNOSIS — R69 Illness, unspecified: Secondary | ICD-10-CM | POA: Diagnosis not present

## 2021-04-13 DIAGNOSIS — E1142 Type 2 diabetes mellitus with diabetic polyneuropathy: Secondary | ICD-10-CM | POA: Diagnosis not present

## 2021-04-13 DIAGNOSIS — E1151 Type 2 diabetes mellitus with diabetic peripheral angiopathy without gangrene: Secondary | ICD-10-CM | POA: Diagnosis not present

## 2021-04-13 DIAGNOSIS — T8789 Other complications of amputation stump: Secondary | ICD-10-CM | POA: Diagnosis not present

## 2021-04-13 DIAGNOSIS — L02415 Cutaneous abscess of right lower limb: Secondary | ICD-10-CM | POA: Diagnosis not present

## 2021-04-13 DIAGNOSIS — E1165 Type 2 diabetes mellitus with hyperglycemia: Secondary | ICD-10-CM | POA: Diagnosis not present

## 2021-04-13 DIAGNOSIS — I10 Essential (primary) hypertension: Secondary | ICD-10-CM | POA: Diagnosis not present

## 2021-04-13 DIAGNOSIS — I8391 Asymptomatic varicose veins of right lower extremity: Secondary | ICD-10-CM | POA: Diagnosis not present

## 2021-04-13 DIAGNOSIS — L03115 Cellulitis of right lower limb: Secondary | ICD-10-CM | POA: Diagnosis not present

## 2021-04-17 DIAGNOSIS — E1151 Type 2 diabetes mellitus with diabetic peripheral angiopathy without gangrene: Secondary | ICD-10-CM | POA: Diagnosis not present

## 2021-04-17 DIAGNOSIS — L02415 Cutaneous abscess of right lower limb: Secondary | ICD-10-CM | POA: Diagnosis not present

## 2021-04-17 DIAGNOSIS — I10 Essential (primary) hypertension: Secondary | ICD-10-CM | POA: Diagnosis not present

## 2021-04-17 DIAGNOSIS — E1142 Type 2 diabetes mellitus with diabetic polyneuropathy: Secondary | ICD-10-CM | POA: Diagnosis not present

## 2021-04-17 DIAGNOSIS — L03115 Cellulitis of right lower limb: Secondary | ICD-10-CM | POA: Diagnosis not present

## 2021-04-17 DIAGNOSIS — I8391 Asymptomatic varicose veins of right lower extremity: Secondary | ICD-10-CM | POA: Diagnosis not present

## 2021-04-17 DIAGNOSIS — T8789 Other complications of amputation stump: Secondary | ICD-10-CM | POA: Diagnosis not present

## 2021-04-17 DIAGNOSIS — E1165 Type 2 diabetes mellitus with hyperglycemia: Secondary | ICD-10-CM | POA: Diagnosis not present

## 2021-04-17 DIAGNOSIS — R69 Illness, unspecified: Secondary | ICD-10-CM | POA: Diagnosis not present

## 2021-04-18 DIAGNOSIS — E1165 Type 2 diabetes mellitus with hyperglycemia: Secondary | ICD-10-CM | POA: Diagnosis not present

## 2021-04-18 DIAGNOSIS — L02415 Cutaneous abscess of right lower limb: Secondary | ICD-10-CM | POA: Diagnosis not present

## 2021-04-18 DIAGNOSIS — I10 Essential (primary) hypertension: Secondary | ICD-10-CM | POA: Diagnosis not present

## 2021-04-18 DIAGNOSIS — T8789 Other complications of amputation stump: Secondary | ICD-10-CM | POA: Diagnosis not present

## 2021-04-18 DIAGNOSIS — I8391 Asymptomatic varicose veins of right lower extremity: Secondary | ICD-10-CM | POA: Diagnosis not present

## 2021-04-18 DIAGNOSIS — L03115 Cellulitis of right lower limb: Secondary | ICD-10-CM | POA: Diagnosis not present

## 2021-04-18 DIAGNOSIS — R69 Illness, unspecified: Secondary | ICD-10-CM | POA: Diagnosis not present

## 2021-04-18 DIAGNOSIS — E1151 Type 2 diabetes mellitus with diabetic peripheral angiopathy without gangrene: Secondary | ICD-10-CM | POA: Diagnosis not present

## 2021-04-18 DIAGNOSIS — E1142 Type 2 diabetes mellitus with diabetic polyneuropathy: Secondary | ICD-10-CM | POA: Diagnosis not present

## 2021-04-20 DIAGNOSIS — R809 Proteinuria, unspecified: Secondary | ICD-10-CM | POA: Diagnosis not present

## 2021-04-20 DIAGNOSIS — R69 Illness, unspecified: Secondary | ICD-10-CM | POA: Diagnosis not present

## 2021-04-20 DIAGNOSIS — E1129 Type 2 diabetes mellitus with other diabetic kidney complication: Secondary | ICD-10-CM | POA: Diagnosis not present

## 2021-04-20 DIAGNOSIS — E1151 Type 2 diabetes mellitus with diabetic peripheral angiopathy without gangrene: Secondary | ICD-10-CM | POA: Diagnosis not present

## 2021-04-20 DIAGNOSIS — Z299 Encounter for prophylactic measures, unspecified: Secondary | ICD-10-CM | POA: Diagnosis not present

## 2021-04-20 DIAGNOSIS — L02415 Cutaneous abscess of right lower limb: Secondary | ICD-10-CM | POA: Diagnosis not present

## 2021-04-20 DIAGNOSIS — L03115 Cellulitis of right lower limb: Secondary | ICD-10-CM | POA: Diagnosis not present

## 2021-04-20 DIAGNOSIS — Z6838 Body mass index (BMI) 38.0-38.9, adult: Secondary | ICD-10-CM | POA: Diagnosis not present

## 2021-04-20 DIAGNOSIS — E1142 Type 2 diabetes mellitus with diabetic polyneuropathy: Secondary | ICD-10-CM | POA: Diagnosis not present

## 2021-04-20 DIAGNOSIS — T8789 Other complications of amputation stump: Secondary | ICD-10-CM | POA: Diagnosis not present

## 2021-04-20 DIAGNOSIS — I10 Essential (primary) hypertension: Secondary | ICD-10-CM | POA: Diagnosis not present

## 2021-04-20 DIAGNOSIS — I8391 Asymptomatic varicose veins of right lower extremity: Secondary | ICD-10-CM | POA: Diagnosis not present

## 2021-04-20 DIAGNOSIS — Z794 Long term (current) use of insulin: Secondary | ICD-10-CM | POA: Diagnosis not present

## 2021-04-20 DIAGNOSIS — E1165 Type 2 diabetes mellitus with hyperglycemia: Secondary | ICD-10-CM | POA: Diagnosis not present

## 2021-04-24 DIAGNOSIS — I8391 Asymptomatic varicose veins of right lower extremity: Secondary | ICD-10-CM | POA: Diagnosis not present

## 2021-04-24 DIAGNOSIS — T8789 Other complications of amputation stump: Secondary | ICD-10-CM | POA: Diagnosis not present

## 2021-04-24 DIAGNOSIS — E1142 Type 2 diabetes mellitus with diabetic polyneuropathy: Secondary | ICD-10-CM | POA: Diagnosis not present

## 2021-04-24 DIAGNOSIS — L02415 Cutaneous abscess of right lower limb: Secondary | ICD-10-CM | POA: Diagnosis not present

## 2021-04-24 DIAGNOSIS — E1165 Type 2 diabetes mellitus with hyperglycemia: Secondary | ICD-10-CM | POA: Diagnosis not present

## 2021-04-24 DIAGNOSIS — L03115 Cellulitis of right lower limb: Secondary | ICD-10-CM | POA: Diagnosis not present

## 2021-04-24 DIAGNOSIS — Z6838 Body mass index (BMI) 38.0-38.9, adult: Secondary | ICD-10-CM | POA: Diagnosis not present

## 2021-04-24 DIAGNOSIS — Z7984 Long term (current) use of oral hypoglycemic drugs: Secondary | ICD-10-CM | POA: Diagnosis not present

## 2021-04-24 DIAGNOSIS — I10 Essential (primary) hypertension: Secondary | ICD-10-CM | POA: Diagnosis not present

## 2021-04-24 DIAGNOSIS — R69 Illness, unspecified: Secondary | ICD-10-CM | POA: Diagnosis not present

## 2021-04-24 DIAGNOSIS — Z602 Problems related to living alone: Secondary | ICD-10-CM | POA: Diagnosis not present

## 2021-04-24 DIAGNOSIS — Z794 Long term (current) use of insulin: Secondary | ICD-10-CM | POA: Diagnosis not present

## 2021-04-24 DIAGNOSIS — E1151 Type 2 diabetes mellitus with diabetic peripheral angiopathy without gangrene: Secondary | ICD-10-CM | POA: Diagnosis not present

## 2021-04-25 DIAGNOSIS — E1151 Type 2 diabetes mellitus with diabetic peripheral angiopathy without gangrene: Secondary | ICD-10-CM | POA: Diagnosis not present

## 2021-04-25 DIAGNOSIS — Z602 Problems related to living alone: Secondary | ICD-10-CM | POA: Diagnosis not present

## 2021-04-25 DIAGNOSIS — E1142 Type 2 diabetes mellitus with diabetic polyneuropathy: Secondary | ICD-10-CM | POA: Diagnosis not present

## 2021-04-25 DIAGNOSIS — Z7984 Long term (current) use of oral hypoglycemic drugs: Secondary | ICD-10-CM | POA: Diagnosis not present

## 2021-04-25 DIAGNOSIS — I8391 Asymptomatic varicose veins of right lower extremity: Secondary | ICD-10-CM | POA: Diagnosis not present

## 2021-04-25 DIAGNOSIS — T8789 Other complications of amputation stump: Secondary | ICD-10-CM | POA: Diagnosis not present

## 2021-04-25 DIAGNOSIS — L03115 Cellulitis of right lower limb: Secondary | ICD-10-CM | POA: Diagnosis not present

## 2021-04-25 DIAGNOSIS — Z6838 Body mass index (BMI) 38.0-38.9, adult: Secondary | ICD-10-CM | POA: Diagnosis not present

## 2021-04-25 DIAGNOSIS — E1165 Type 2 diabetes mellitus with hyperglycemia: Secondary | ICD-10-CM | POA: Diagnosis not present

## 2021-04-25 DIAGNOSIS — R69 Illness, unspecified: Secondary | ICD-10-CM | POA: Diagnosis not present

## 2021-04-25 DIAGNOSIS — L02415 Cutaneous abscess of right lower limb: Secondary | ICD-10-CM | POA: Diagnosis not present

## 2021-04-25 DIAGNOSIS — Z794 Long term (current) use of insulin: Secondary | ICD-10-CM | POA: Diagnosis not present

## 2021-04-25 DIAGNOSIS — I10 Essential (primary) hypertension: Secondary | ICD-10-CM | POA: Diagnosis not present

## 2021-04-26 DIAGNOSIS — L02415 Cutaneous abscess of right lower limb: Secondary | ICD-10-CM | POA: Diagnosis not present

## 2021-04-26 DIAGNOSIS — E1165 Type 2 diabetes mellitus with hyperglycemia: Secondary | ICD-10-CM | POA: Diagnosis not present

## 2021-04-26 DIAGNOSIS — Z6838 Body mass index (BMI) 38.0-38.9, adult: Secondary | ICD-10-CM | POA: Diagnosis not present

## 2021-04-26 DIAGNOSIS — Z794 Long term (current) use of insulin: Secondary | ICD-10-CM | POA: Diagnosis not present

## 2021-04-26 DIAGNOSIS — I8391 Asymptomatic varicose veins of right lower extremity: Secondary | ICD-10-CM | POA: Diagnosis not present

## 2021-04-26 DIAGNOSIS — R69 Illness, unspecified: Secondary | ICD-10-CM | POA: Diagnosis not present

## 2021-04-26 DIAGNOSIS — T8789 Other complications of amputation stump: Secondary | ICD-10-CM | POA: Diagnosis not present

## 2021-04-26 DIAGNOSIS — E1151 Type 2 diabetes mellitus with diabetic peripheral angiopathy without gangrene: Secondary | ICD-10-CM | POA: Diagnosis not present

## 2021-04-26 DIAGNOSIS — L03115 Cellulitis of right lower limb: Secondary | ICD-10-CM | POA: Diagnosis not present

## 2021-04-26 DIAGNOSIS — Z7984 Long term (current) use of oral hypoglycemic drugs: Secondary | ICD-10-CM | POA: Diagnosis not present

## 2021-04-26 DIAGNOSIS — I10 Essential (primary) hypertension: Secondary | ICD-10-CM | POA: Diagnosis not present

## 2021-04-26 DIAGNOSIS — Z602 Problems related to living alone: Secondary | ICD-10-CM | POA: Diagnosis not present

## 2021-04-26 DIAGNOSIS — E1142 Type 2 diabetes mellitus with diabetic polyneuropathy: Secondary | ICD-10-CM | POA: Diagnosis not present

## 2021-04-27 DIAGNOSIS — E1165 Type 2 diabetes mellitus with hyperglycemia: Secondary | ICD-10-CM | POA: Diagnosis not present

## 2021-04-27 DIAGNOSIS — Z7984 Long term (current) use of oral hypoglycemic drugs: Secondary | ICD-10-CM | POA: Diagnosis not present

## 2021-04-27 DIAGNOSIS — E1129 Type 2 diabetes mellitus with other diabetic kidney complication: Secondary | ICD-10-CM | POA: Diagnosis not present

## 2021-04-27 DIAGNOSIS — I8391 Asymptomatic varicose veins of right lower extremity: Secondary | ICD-10-CM | POA: Diagnosis not present

## 2021-04-27 DIAGNOSIS — R69 Illness, unspecified: Secondary | ICD-10-CM | POA: Diagnosis not present

## 2021-04-27 DIAGNOSIS — Z794 Long term (current) use of insulin: Secondary | ICD-10-CM | POA: Diagnosis not present

## 2021-04-27 DIAGNOSIS — I10 Essential (primary) hypertension: Secondary | ICD-10-CM | POA: Diagnosis not present

## 2021-04-27 DIAGNOSIS — L03115 Cellulitis of right lower limb: Secondary | ICD-10-CM | POA: Diagnosis not present

## 2021-04-27 DIAGNOSIS — L02415 Cutaneous abscess of right lower limb: Secondary | ICD-10-CM | POA: Diagnosis not present

## 2021-04-27 DIAGNOSIS — T8789 Other complications of amputation stump: Secondary | ICD-10-CM | POA: Diagnosis not present

## 2021-04-27 DIAGNOSIS — E1151 Type 2 diabetes mellitus with diabetic peripheral angiopathy without gangrene: Secondary | ICD-10-CM | POA: Diagnosis not present

## 2021-04-27 DIAGNOSIS — Z6838 Body mass index (BMI) 38.0-38.9, adult: Secondary | ICD-10-CM | POA: Diagnosis not present

## 2021-04-27 DIAGNOSIS — Z602 Problems related to living alone: Secondary | ICD-10-CM | POA: Diagnosis not present

## 2021-04-27 DIAGNOSIS — E1142 Type 2 diabetes mellitus with diabetic polyneuropathy: Secondary | ICD-10-CM | POA: Diagnosis not present

## 2021-05-02 DIAGNOSIS — Z602 Problems related to living alone: Secondary | ICD-10-CM | POA: Diagnosis not present

## 2021-05-02 DIAGNOSIS — Z6838 Body mass index (BMI) 38.0-38.9, adult: Secondary | ICD-10-CM | POA: Diagnosis not present

## 2021-05-02 DIAGNOSIS — Z7984 Long term (current) use of oral hypoglycemic drugs: Secondary | ICD-10-CM | POA: Diagnosis not present

## 2021-05-02 DIAGNOSIS — T8789 Other complications of amputation stump: Secondary | ICD-10-CM | POA: Diagnosis not present

## 2021-05-02 DIAGNOSIS — E1151 Type 2 diabetes mellitus with diabetic peripheral angiopathy without gangrene: Secondary | ICD-10-CM | POA: Diagnosis not present

## 2021-05-02 DIAGNOSIS — I8391 Asymptomatic varicose veins of right lower extremity: Secondary | ICD-10-CM | POA: Diagnosis not present

## 2021-05-02 DIAGNOSIS — R69 Illness, unspecified: Secondary | ICD-10-CM | POA: Diagnosis not present

## 2021-05-02 DIAGNOSIS — Z794 Long term (current) use of insulin: Secondary | ICD-10-CM | POA: Diagnosis not present

## 2021-05-02 DIAGNOSIS — L02415 Cutaneous abscess of right lower limb: Secondary | ICD-10-CM | POA: Diagnosis not present

## 2021-05-02 DIAGNOSIS — E1142 Type 2 diabetes mellitus with diabetic polyneuropathy: Secondary | ICD-10-CM | POA: Diagnosis not present

## 2021-05-02 DIAGNOSIS — E1165 Type 2 diabetes mellitus with hyperglycemia: Secondary | ICD-10-CM | POA: Diagnosis not present

## 2021-05-02 DIAGNOSIS — L03115 Cellulitis of right lower limb: Secondary | ICD-10-CM | POA: Diagnosis not present

## 2021-05-02 DIAGNOSIS — I10 Essential (primary) hypertension: Secondary | ICD-10-CM | POA: Diagnosis not present

## 2021-05-03 DIAGNOSIS — R531 Weakness: Secondary | ICD-10-CM | POA: Diagnosis not present

## 2021-05-04 DIAGNOSIS — R69 Illness, unspecified: Secondary | ICD-10-CM | POA: Diagnosis not present

## 2021-05-04 DIAGNOSIS — L03115 Cellulitis of right lower limb: Secondary | ICD-10-CM | POA: Diagnosis not present

## 2021-05-04 DIAGNOSIS — Z794 Long term (current) use of insulin: Secondary | ICD-10-CM | POA: Diagnosis not present

## 2021-05-04 DIAGNOSIS — Z602 Problems related to living alone: Secondary | ICD-10-CM | POA: Diagnosis not present

## 2021-05-04 DIAGNOSIS — I8391 Asymptomatic varicose veins of right lower extremity: Secondary | ICD-10-CM | POA: Diagnosis not present

## 2021-05-04 DIAGNOSIS — I10 Essential (primary) hypertension: Secondary | ICD-10-CM | POA: Diagnosis not present

## 2021-05-04 DIAGNOSIS — Z7984 Long term (current) use of oral hypoglycemic drugs: Secondary | ICD-10-CM | POA: Diagnosis not present

## 2021-05-04 DIAGNOSIS — T8789 Other complications of amputation stump: Secondary | ICD-10-CM | POA: Diagnosis not present

## 2021-05-04 DIAGNOSIS — E1142 Type 2 diabetes mellitus with diabetic polyneuropathy: Secondary | ICD-10-CM | POA: Diagnosis not present

## 2021-05-04 DIAGNOSIS — Z6838 Body mass index (BMI) 38.0-38.9, adult: Secondary | ICD-10-CM | POA: Diagnosis not present

## 2021-05-04 DIAGNOSIS — E1151 Type 2 diabetes mellitus with diabetic peripheral angiopathy without gangrene: Secondary | ICD-10-CM | POA: Diagnosis not present

## 2021-05-04 DIAGNOSIS — E1165 Type 2 diabetes mellitus with hyperglycemia: Secondary | ICD-10-CM | POA: Diagnosis not present

## 2021-05-04 DIAGNOSIS — L02415 Cutaneous abscess of right lower limb: Secondary | ICD-10-CM | POA: Diagnosis not present

## 2021-05-08 DIAGNOSIS — E1165 Type 2 diabetes mellitus with hyperglycemia: Secondary | ICD-10-CM | POA: Diagnosis not present

## 2021-05-08 DIAGNOSIS — I10 Essential (primary) hypertension: Secondary | ICD-10-CM | POA: Diagnosis not present

## 2021-05-08 DIAGNOSIS — Z7984 Long term (current) use of oral hypoglycemic drugs: Secondary | ICD-10-CM | POA: Diagnosis not present

## 2021-05-08 DIAGNOSIS — R69 Illness, unspecified: Secondary | ICD-10-CM | POA: Diagnosis not present

## 2021-05-08 DIAGNOSIS — E1142 Type 2 diabetes mellitus with diabetic polyneuropathy: Secondary | ICD-10-CM | POA: Diagnosis not present

## 2021-05-08 DIAGNOSIS — Z602 Problems related to living alone: Secondary | ICD-10-CM | POA: Diagnosis not present

## 2021-05-08 DIAGNOSIS — T8789 Other complications of amputation stump: Secondary | ICD-10-CM | POA: Diagnosis not present

## 2021-05-08 DIAGNOSIS — I8391 Asymptomatic varicose veins of right lower extremity: Secondary | ICD-10-CM | POA: Diagnosis not present

## 2021-05-08 DIAGNOSIS — Z794 Long term (current) use of insulin: Secondary | ICD-10-CM | POA: Diagnosis not present

## 2021-05-08 DIAGNOSIS — L02415 Cutaneous abscess of right lower limb: Secondary | ICD-10-CM | POA: Diagnosis not present

## 2021-05-08 DIAGNOSIS — E1151 Type 2 diabetes mellitus with diabetic peripheral angiopathy without gangrene: Secondary | ICD-10-CM | POA: Diagnosis not present

## 2021-05-08 DIAGNOSIS — Z6838 Body mass index (BMI) 38.0-38.9, adult: Secondary | ICD-10-CM | POA: Diagnosis not present

## 2021-05-08 DIAGNOSIS — L03115 Cellulitis of right lower limb: Secondary | ICD-10-CM | POA: Diagnosis not present

## 2021-05-10 DIAGNOSIS — L02415 Cutaneous abscess of right lower limb: Secondary | ICD-10-CM | POA: Diagnosis not present

## 2021-05-10 DIAGNOSIS — E1142 Type 2 diabetes mellitus with diabetic polyneuropathy: Secondary | ICD-10-CM | POA: Diagnosis not present

## 2021-05-10 DIAGNOSIS — Z794 Long term (current) use of insulin: Secondary | ICD-10-CM | POA: Diagnosis not present

## 2021-05-10 DIAGNOSIS — E1165 Type 2 diabetes mellitus with hyperglycemia: Secondary | ICD-10-CM | POA: Diagnosis not present

## 2021-05-10 DIAGNOSIS — T8789 Other complications of amputation stump: Secondary | ICD-10-CM | POA: Diagnosis not present

## 2021-05-10 DIAGNOSIS — Z602 Problems related to living alone: Secondary | ICD-10-CM | POA: Diagnosis not present

## 2021-05-10 DIAGNOSIS — E1151 Type 2 diabetes mellitus with diabetic peripheral angiopathy without gangrene: Secondary | ICD-10-CM | POA: Diagnosis not present

## 2021-05-10 DIAGNOSIS — L03115 Cellulitis of right lower limb: Secondary | ICD-10-CM | POA: Diagnosis not present

## 2021-05-10 DIAGNOSIS — Z7984 Long term (current) use of oral hypoglycemic drugs: Secondary | ICD-10-CM | POA: Diagnosis not present

## 2021-05-10 DIAGNOSIS — R69 Illness, unspecified: Secondary | ICD-10-CM | POA: Diagnosis not present

## 2021-05-10 DIAGNOSIS — I10 Essential (primary) hypertension: Secondary | ICD-10-CM | POA: Diagnosis not present

## 2021-05-10 DIAGNOSIS — Z6838 Body mass index (BMI) 38.0-38.9, adult: Secondary | ICD-10-CM | POA: Diagnosis not present

## 2021-05-10 DIAGNOSIS — I8391 Asymptomatic varicose veins of right lower extremity: Secondary | ICD-10-CM | POA: Diagnosis not present

## 2021-05-11 DIAGNOSIS — E1165 Type 2 diabetes mellitus with hyperglycemia: Secondary | ICD-10-CM | POA: Diagnosis not present

## 2021-05-11 DIAGNOSIS — E1142 Type 2 diabetes mellitus with diabetic polyneuropathy: Secondary | ICD-10-CM | POA: Diagnosis not present

## 2021-05-11 DIAGNOSIS — Z602 Problems related to living alone: Secondary | ICD-10-CM | POA: Diagnosis not present

## 2021-05-11 DIAGNOSIS — I10 Essential (primary) hypertension: Secondary | ICD-10-CM | POA: Diagnosis not present

## 2021-05-11 DIAGNOSIS — T8789 Other complications of amputation stump: Secondary | ICD-10-CM | POA: Diagnosis not present

## 2021-05-11 DIAGNOSIS — L03115 Cellulitis of right lower limb: Secondary | ICD-10-CM | POA: Diagnosis not present

## 2021-05-11 DIAGNOSIS — E1151 Type 2 diabetes mellitus with diabetic peripheral angiopathy without gangrene: Secondary | ICD-10-CM | POA: Diagnosis not present

## 2021-05-11 DIAGNOSIS — Z7984 Long term (current) use of oral hypoglycemic drugs: Secondary | ICD-10-CM | POA: Diagnosis not present

## 2021-05-11 DIAGNOSIS — Z6838 Body mass index (BMI) 38.0-38.9, adult: Secondary | ICD-10-CM | POA: Diagnosis not present

## 2021-05-11 DIAGNOSIS — R69 Illness, unspecified: Secondary | ICD-10-CM | POA: Diagnosis not present

## 2021-05-11 DIAGNOSIS — Z794 Long term (current) use of insulin: Secondary | ICD-10-CM | POA: Diagnosis not present

## 2021-05-11 DIAGNOSIS — L02415 Cutaneous abscess of right lower limb: Secondary | ICD-10-CM | POA: Diagnosis not present

## 2021-05-11 DIAGNOSIS — I8391 Asymptomatic varicose veins of right lower extremity: Secondary | ICD-10-CM | POA: Diagnosis not present

## 2021-05-17 DIAGNOSIS — E1165 Type 2 diabetes mellitus with hyperglycemia: Secondary | ICD-10-CM | POA: Diagnosis not present

## 2021-05-17 DIAGNOSIS — L02415 Cutaneous abscess of right lower limb: Secondary | ICD-10-CM | POA: Diagnosis not present

## 2021-05-17 DIAGNOSIS — Z602 Problems related to living alone: Secondary | ICD-10-CM | POA: Diagnosis not present

## 2021-05-17 DIAGNOSIS — Z6838 Body mass index (BMI) 38.0-38.9, adult: Secondary | ICD-10-CM | POA: Diagnosis not present

## 2021-05-17 DIAGNOSIS — Z794 Long term (current) use of insulin: Secondary | ICD-10-CM | POA: Diagnosis not present

## 2021-05-17 DIAGNOSIS — E1142 Type 2 diabetes mellitus with diabetic polyneuropathy: Secondary | ICD-10-CM | POA: Diagnosis not present

## 2021-05-17 DIAGNOSIS — I8391 Asymptomatic varicose veins of right lower extremity: Secondary | ICD-10-CM | POA: Diagnosis not present

## 2021-05-17 DIAGNOSIS — E1151 Type 2 diabetes mellitus with diabetic peripheral angiopathy without gangrene: Secondary | ICD-10-CM | POA: Diagnosis not present

## 2021-05-17 DIAGNOSIS — I10 Essential (primary) hypertension: Secondary | ICD-10-CM | POA: Diagnosis not present

## 2021-05-17 DIAGNOSIS — R69 Illness, unspecified: Secondary | ICD-10-CM | POA: Diagnosis not present

## 2021-05-17 DIAGNOSIS — T8789 Other complications of amputation stump: Secondary | ICD-10-CM | POA: Diagnosis not present

## 2021-05-17 DIAGNOSIS — Z7984 Long term (current) use of oral hypoglycemic drugs: Secondary | ICD-10-CM | POA: Diagnosis not present

## 2021-05-17 DIAGNOSIS — L03115 Cellulitis of right lower limb: Secondary | ICD-10-CM | POA: Diagnosis not present

## 2021-05-27 DIAGNOSIS — E1129 Type 2 diabetes mellitus with other diabetic kidney complication: Secondary | ICD-10-CM | POA: Diagnosis not present

## 2021-06-02 DIAGNOSIS — R531 Weakness: Secondary | ICD-10-CM | POA: Diagnosis not present

## 2021-06-23 DIAGNOSIS — S98911A Complete traumatic amputation of right foot, level unspecified, initial encounter: Secondary | ICD-10-CM | POA: Diagnosis not present

## 2021-06-27 DIAGNOSIS — F17210 Nicotine dependence, cigarettes, uncomplicated: Secondary | ICD-10-CM | POA: Diagnosis not present

## 2021-06-27 DIAGNOSIS — E1129 Type 2 diabetes mellitus with other diabetic kidney complication: Secondary | ICD-10-CM | POA: Diagnosis not present

## 2021-06-27 DIAGNOSIS — R69 Illness, unspecified: Secondary | ICD-10-CM | POA: Diagnosis not present

## 2021-06-27 DIAGNOSIS — Z299 Encounter for prophylactic measures, unspecified: Secondary | ICD-10-CM | POA: Diagnosis not present

## 2021-06-27 DIAGNOSIS — M1990 Unspecified osteoarthritis, unspecified site: Secondary | ICD-10-CM | POA: Diagnosis not present

## 2021-06-27 DIAGNOSIS — I10 Essential (primary) hypertension: Secondary | ICD-10-CM | POA: Diagnosis not present

## 2021-07-03 DIAGNOSIS — R531 Weakness: Secondary | ICD-10-CM | POA: Diagnosis not present

## 2021-07-03 DIAGNOSIS — E1129 Type 2 diabetes mellitus with other diabetic kidney complication: Secondary | ICD-10-CM | POA: Diagnosis not present

## 2021-07-07 ENCOUNTER — Telehealth: Payer: Self-pay | Admitting: Cardiovascular Disease

## 2021-07-25 DIAGNOSIS — E1129 Type 2 diabetes mellitus with other diabetic kidney complication: Secondary | ICD-10-CM | POA: Diagnosis not present

## 2021-08-02 DIAGNOSIS — R531 Weakness: Secondary | ICD-10-CM | POA: Diagnosis not present

## 2021-08-24 ENCOUNTER — Other Ambulatory Visit: Payer: Self-pay | Admitting: *Deleted

## 2021-08-24 NOTE — Patient Outreach (Signed)
  Care Coordination   08/24/2021 Name: Mitchell [name] MRN: [id number] DOB: 26-Sep-1950   Care Coordination Outreach Attempts:  An unsuccessful telephone outreach was attempted today to offer the patient information about available care coordination services as a benefit of their health plan.   Follow Up Plan:  Additional outreach attempts will be made to offer the patient care coordination information and services.   Encounter Outcome:  No Answer  Care Coordination Interventions Activated:  No   Care Coordination Interventions:  No, not indicated    Kemper Durie, RN, MSN, [facility] Care Coordinator [phone]

## 2021-08-25 DIAGNOSIS — E1129 Type 2 diabetes mellitus with other diabetic kidney complication: Secondary | ICD-10-CM | POA: Diagnosis not present

## 2021-08-29 ENCOUNTER — Encounter: Payer: Self-pay | Admitting: *Deleted

## 2021-08-29 ENCOUNTER — Other Ambulatory Visit: Payer: Self-pay | Admitting: *Deleted

## 2021-08-29 NOTE — Patient Outreach (Signed)
  Care Coordination   Initial Visit Note   08/29/2021 Name: Ricahrd Schwager MRN: [id number] DOB: Jun 18, 1950  Blessed Cotham is a 71 y.o. year old male who sees Kirstie Peri, MD for primary care. I spoke with  Frazier Butt by phone today  What matters to the patients health and wellness today?  Has family issues with ex-wife and her family, causes stress at times, but report he has learned to deal with it.  Feels he is managing his DM well, last A1C was 7.1.  Report last visit in June was his AWV.       SDOH assessments and interventions completed:  Yes  SDOH Interventions Today    Flowsheet Row Most Recent Value  SDOH Interventions   Housing Interventions Intervention Not Indicated  Transportation Interventions Intervention Not Indicated        Care Coordination Interventions Activated:  Yes  Care Coordination Interventions:  Yes, provided   Follow up plan: No further intervention required.   Encounter Outcome:  Pt. Visit Completed   Kemper Durie, RN, MSN, [facility] Care Coordinator [phone]

## 2021-09-02 DIAGNOSIS — R531 Weakness: Secondary | ICD-10-CM | POA: Diagnosis not present

## 2021-09-25 DIAGNOSIS — E1129 Type 2 diabetes mellitus with other diabetic kidney complication: Secondary | ICD-10-CM | POA: Diagnosis not present

## 2021-10-02 DIAGNOSIS — E1129 Type 2 diabetes mellitus with other diabetic kidney complication: Secondary | ICD-10-CM | POA: Diagnosis not present

## 2021-10-10 DIAGNOSIS — I10 Essential (primary) hypertension: Secondary | ICD-10-CM | POA: Diagnosis not present

## 2021-10-10 DIAGNOSIS — R69 Illness, unspecified: Secondary | ICD-10-CM | POA: Diagnosis not present

## 2021-10-10 DIAGNOSIS — J302 Other seasonal allergic rhinitis: Secondary | ICD-10-CM | POA: Diagnosis not present

## 2021-10-10 DIAGNOSIS — F17210 Nicotine dependence, cigarettes, uncomplicated: Secondary | ICD-10-CM | POA: Diagnosis not present

## 2021-10-10 DIAGNOSIS — Z299 Encounter for prophylactic measures, unspecified: Secondary | ICD-10-CM | POA: Diagnosis not present

## 2021-10-10 DIAGNOSIS — M545 Low back pain, unspecified: Secondary | ICD-10-CM | POA: Diagnosis not present

## 2021-10-10 DIAGNOSIS — E1165 Type 2 diabetes mellitus with hyperglycemia: Secondary | ICD-10-CM | POA: Diagnosis not present

## 2021-10-10 DIAGNOSIS — Z6841 Body Mass Index (BMI) 40.0 and over, adult: Secondary | ICD-10-CM | POA: Diagnosis not present

## 2021-10-14 DIAGNOSIS — E7800 Pure hypercholesterolemia, unspecified: Secondary | ICD-10-CM | POA: Diagnosis not present

## 2021-10-14 DIAGNOSIS — I10 Essential (primary) hypertension: Secondary | ICD-10-CM | POA: Diagnosis not present

## 2021-10-17 DIAGNOSIS — Z1339 Encounter for screening examination for other mental health and behavioral disorders: Secondary | ICD-10-CM | POA: Diagnosis not present

## 2021-10-17 DIAGNOSIS — Z125 Encounter for screening for malignant neoplasm of prostate: Secondary | ICD-10-CM | POA: Diagnosis not present

## 2021-10-17 DIAGNOSIS — Z0000 Encounter for general adult medical examination without abnormal findings: Secondary | ICD-10-CM | POA: Diagnosis not present

## 2021-10-17 DIAGNOSIS — R5383 Other fatigue: Secondary | ICD-10-CM | POA: Diagnosis not present

## 2021-10-17 DIAGNOSIS — Z79899 Other long term (current) drug therapy: Secondary | ICD-10-CM | POA: Diagnosis not present

## 2021-10-17 DIAGNOSIS — Z6839 Body mass index (BMI) 39.0-39.9, adult: Secondary | ICD-10-CM | POA: Diagnosis not present

## 2021-10-17 DIAGNOSIS — I10 Essential (primary) hypertension: Secondary | ICD-10-CM | POA: Diagnosis not present

## 2021-10-17 DIAGNOSIS — Z299 Encounter for prophylactic measures, unspecified: Secondary | ICD-10-CM | POA: Diagnosis not present

## 2021-10-17 DIAGNOSIS — E7800 Pure hypercholesterolemia, unspecified: Secondary | ICD-10-CM | POA: Diagnosis not present

## 2021-10-17 DIAGNOSIS — Z1331 Encounter for screening for depression: Secondary | ICD-10-CM | POA: Diagnosis not present

## 2021-10-17 DIAGNOSIS — Z7189 Other specified counseling: Secondary | ICD-10-CM | POA: Diagnosis not present

## 2021-10-23 DIAGNOSIS — E1129 Type 2 diabetes mellitus with other diabetic kidney complication: Secondary | ICD-10-CM | POA: Diagnosis not present

## 2021-11-23 DIAGNOSIS — E1129 Type 2 diabetes mellitus with other diabetic kidney complication: Secondary | ICD-10-CM | POA: Diagnosis not present

## 2021-11-27 ENCOUNTER — Encounter (HOSPITAL_COMMUNITY): Payer: Self-pay | Admitting: Physical Therapy

## 2021-11-27 ENCOUNTER — Ambulatory Visit (HOSPITAL_COMMUNITY): Payer: Medicare HMO | Attending: Internal Medicine | Admitting: Physical Therapy

## 2021-11-27 DIAGNOSIS — R293 Abnormal posture: Secondary | ICD-10-CM

## 2021-11-27 DIAGNOSIS — R29898 Other symptoms and signs involving the musculoskeletal system: Secondary | ICD-10-CM | POA: Diagnosis not present

## 2021-11-27 DIAGNOSIS — M6281 Muscle weakness (generalized): Secondary | ICD-10-CM | POA: Diagnosis not present

## 2021-11-27 DIAGNOSIS — R2689 Other abnormalities of gait and mobility: Secondary | ICD-10-CM | POA: Diagnosis not present

## 2021-11-27 DIAGNOSIS — M5459 Other low back pain: Secondary | ICD-10-CM | POA: Diagnosis not present

## 2021-11-27 NOTE — Therapy (Signed)
OUTPATIENT PHYSICAL THERAPY THORACOLUMBAR EVALUATION   Patient Name: Mitchell [name] MRN: [id number] DOB:Oct 28, 1950, 71 y.o., male Today's Date: 11/27/2021   PT End of Session - 11/27/21 1252     Visit Number 1    Number of Visits 8    Date for PT Re-Evaluation 12/25/21    Authorization Type [employer] Medicare  (no vl, no auth)    PT Start Time 1255    PT Stop Time 1334    PT Time Calculation (min) 39 min    Activity Tolerance Patient tolerated treatment well    Behavior During Therapy [facility] for tasks assessed/performed             Past Medical History:  Diagnosis Date   Arthritis    Cellulitis of right lower extremity 05/09/2014   Diabetes mellitus without complication (HCC)    Diabetic foot ulcer (HCC) 07/07/2014   Status post bedside debridement of multiple right plantar ulcers by podiatrist, Dr. Reynolds Bowl.   Hypercholesterolemia    IDA (iron deficiency anemia)    Maggot infestation    right foot ulcer   Obesity 05/09/2014   Tobacco abuse 07/07/2014   Past Surgical History:  Procedure Laterality Date   APPENDECTOMY     APPLICATION OF WOUND VAC Right 10/27/2020   Procedure: APPLICATION OF WOUND VAC;  Surgeon: Nada Libman, MD;  Location: [facility] OR;  Service: Vascular;  Laterality: Right;   ESOPHAGOGASTRODUODENOSCOPY (EGD) WITH PROPOFOL N/A 10/28/2020   Procedure: ESOPHAGOGASTRODUODENOSCOPY (EGD) WITH PROPOFOL;  Surgeon: [name], MD;  Location: [facility] ENDOSCOPY;  Service: Gastroenterology;  Laterality: N/A;   HEMOSTASIS CLIP PLACEMENT  10/28/2020   Procedure: HEMOSTASIS CLIP PLACEMENT;  Surgeon: [name], MD;  Location: [facility] ENDOSCOPY;  Service: Gastroenterology;;   HOT HEMOSTASIS N/A 10/28/2020   Procedure: HOT HEMOSTASIS (ARGON PLASMA COAGULATION/BICAP);  Surgeon: [name], MD;  Location: [facility] ENDOSCOPY;  Service: Gastroenterology;  Laterality: N/A;   PERIPHERAL VASCULAR CATHETERIZATION N/A 07/22/2015   Procedure: Abdominal Aortogram w/Lower Extremity;  Surgeon: Sherren Kerns, MD;  Location: [facility] INVASIVE CV LAB;  Service: Cardiovascular;  Laterality: N/A;   TRANSMETATARSAL AMPUTATION Right 10/27/2020   Procedure: TRANSMETATARSAL AMPUTATION;  Surgeon: Nada Libman, MD;  Location: [facility] OR;  Service: Vascular;  Laterality: Right;   Patient Active Problem List   Diagnosis Date Noted   Foot osteomyelitis, right (HCC) 10/25/2020   GI bleed 10/25/2020   Blood loss anemia 10/25/2020   Osteomyelitis of right foot (HCC) 10/25/2020   Closed fracture of proximal end of right humerus with routine healing 02/25/2019   Pre-ulcerative calluses 04/20/2018   Myositis 07/08/2014   Diabetic foot ulcer associated with type 2 diabetes mellitus (HCC) 07/07/2014   Tobacco abuse 07/07/2014   Diabetic ulcer of right foot (HCC) 05/12/2014   Pulse weakness    Peripheral neuropathy    Cellulitis of right lower extremity 05/09/2014   Obesity 05/09/2014   Cellulitis of right lower leg 05/09/2014   Hyperglycemia 03/21/2013   Type 2 diabetes mellitus with foot ulcer (HCC) 03/21/2013   Noncompliance with medications due to cost issues 03/21/2013    PCP: Kirstie Peri, MD  REFERRING PROVIDER: Kirstie Peri, MD  REFERRING DIAG: LBP  Rationale for Evaluation and Treatment: Rehabilitation  THERAPY DIAG:  Other low back pain  Muscle weakness (generalized)  Other abnormalities of gait and mobility  Other symptoms and signs involving the musculoskeletal system  Abnormal posture  ONSET DATE: 1 year  SUBJECTIVE:  SUBJECTIVE STATEMENT: Patient states back has been hurting since he was in the hospital for his foot. His back bothers him with bending, chores, getting out of bed and more. Back is rough with trying to do stuff.   PERTINENT HISTORY:  DM, chronic pain, hx foot  ulcer/amputation  PAIN:  Are you having pain? Yes: NPRS scale: 4/10 Pain location: low back Pain description: stabbing Aggravating factors: chores, bending, movement  Relieving factors: none  PRECAUTIONS: None  WEIGHT BEARING RESTRICTIONS: No  FALLS:  Has patient fallen in last 6 months? No  LIVING ENVIRONMENT: Lives with: lives alone Lives in: House/apartment Stairs:  ramp Has following equipment at home: Single point cane  OCCUPATION: Retired  PLOF: Independent  PATIENT GOALS: decrease back pain  NEXT MD VISIT: not sure  OBJECTIVE:   PATIENT SURVEYS:  FOTO 40% function  SCREENING FOR RED FLAGS: Bowel or bladder incontinence: No Spinal tumors: No Cauda equina syndrome: No Compression fracture: No Abdominal aneurysm: No  COGNITION: Overall cognitive status: Within functional limits for tasks assessed     SENSATION: WFL   POSTURE: rounded shoulders, forward head, decreased lumbar lordosis, and increased thoracic kyphosis  PALPATION: TTP lumbar paraspinals   LUMBAR ROM:   AROM eval  Flexion 0% limited  Extension 90% limited  Right lateral flexion 50% limited  Left lateral flexion 50% limited  Right rotation 50% limited  Left rotation 50% limited   (Blank rows = not tested)  LOWER EXTREMITY ROM:   WFL for tasks assessed  Active  Right eval Left eval  Hip flexion    Hip extension    Hip abduction    Hip adduction    Hip internal rotation    Hip external rotation    Knee flexion    Knee extension    Ankle dorsiflexion    Ankle plantarflexion    Ankle inversion    Ankle eversion     (Blank rows = not tested)  LOWER EXTREMITY MMT:    MMT Right eval Left eval  Hip flexion 4+ 4+  Hip extension 3+ 4-  Hip abduction 3+ 4-  Hip adduction    Hip internal rotation    Hip external rotation    Knee flexion 4+ 5  Knee extension 4+ 5  Ankle dorsiflexion    Ankle plantarflexion    Ankle inversion    Ankle eversion     (Blank rows = not  tested)    FUNCTIONAL TESTS:  5 times sit to stand: 22.61 seconds without UE use 2 minute walk test: 375 feet with SPC Transfer STS: labored, requires bilateral UE support  GAIT: Distance walked: 375 feet Assistive device utilized: Single point cane Level of assistance: Modified independence Comments: , gradually increasing trunk flexion, decreased L arm swing, Trendelenburg on L   TODAY'S TREATMENT:  DATE:  11/27/21  STS 2x 10  Standing hip abduction 2 x 10 bilateral    PATIENT EDUCATION:  Education details: Patient educated on exam findings, POC, scope of PT, HEP, and posture. Person educated: Patient Education method: Explanation, Demonstration, and Handouts Education comprehension: verbalized understanding, returned demonstration, verbal cues required, and tactile cues required  HOME EXERCISE PROGRAM: Access Code: [email or web address]  Date: 11/27/2021 - Sit to Stand  - 2 x daily - 7 x weekly - 2 sets - 10 reps - Standing Hip Abduction with Counter Support  - 2 x daily - 7 x weekly - 2 sets - 10 reps  ASSESSMENT:  CLINICAL IMPRESSION: Patient a 71 y.o. y.o. male who was seen today for physical therapy evaluation and treatment for LBP. Patient presents with pain limited deficits in lumbar spine strength, ROM, endurance, activity tolerance, and functional mobility with ADL. Patient is having to modify and restrict ADL as indicated by outcome measure score as well as subjective information and objective measures which is affecting overall participation. Patient will benefit from skilled physical therapy in order to improve function and reduce impairment.   OBJECTIVE IMPAIRMENTS: Abnormal gait, decreased activity tolerance, decreased balance, decreased endurance, decreased mobility, difficulty walking, decreased ROM, decreased strength, hypomobility, increased  muscle spasms, impaired flexibility, impaired sensation, improper body mechanics, postural dysfunction, and pain.   ACTIVITY LIMITATIONS: carrying, lifting, bending, sitting, standing, squatting, sleeping, stairs, transfers, reach over head, hygiene/grooming, locomotion level, and caring for others  PARTICIPATION LIMITATIONS: meal prep, cleaning, laundry, shopping, community activity, and yard work  PERSONAL FACTORS: Fitness, Time since onset of injury/illness/exacerbation, and 3+ comorbidities: DM, chronic pain, hx foot ulcer/amputation, sedentary, tobacco use  are also affecting patient's functional outcome.   REHAB POTENTIAL: Good  CLINICAL DECISION MAKING: Stable/uncomplicated  EVALUATION COMPLEXITY: Low    GOALS: Goals reviewed with patient? Yes  SHORT TERM GOALS: Target date: 12/18/2021  Patient will be independent with HEP in order to improve functional outcomes. Baseline:  Goal status: INITIAL  2.  Patient will report at least 25% improvement in symptoms for improved quality of life. Baseline: Goal status: INITIAL    LONG TERM GOALS: Target date: 12/25/2021  Patient will report at least 75% improvement in symptoms for improved quality of life. Baseline:  Goal status: INITIAL  2.  Patient will improve FOTO score by at least 15 points in order to indicate improved tolerance to activity. Baseline: 40% function Goal status: INITIAL  3.  Patient will demonstrate at least 25% improvement in lumbar ROM in all restricted planes for improved ability to move trunk while completing chores. Baseline: see above Goal status: INITIAL  4.  Patient will be able to ambulate at least 425 feet in 2MWT in order to demonstrate improved tolerance to activity. Baseline: 375 feet with SPC Goal status: INITIAL  5.  Patient will be able to complete 5x STS in under 14 seconds in order to reduce the risk of falls. Baseline: 22.61 seconds without UE use Goal status: INITIAL  6.  Patient  will demonstrate grade of 5/5 MMT grade in all tested musculature as evidence of improved strength to assist with stair ambulation and gait.   Baseline: see above Goal status: INITIAL   PLAN:  PT FREQUENCY: 2x/week  PT DURATION: 4 weeks  PLANNED INTERVENTIONS: Therapeutic exercises, Therapeutic activity, Neuromuscular re-education, Balance training, Gait training, Patient/Family education, Joint manipulation, Joint mobilization, Stair training, Orthotic/Fit training, DME instructions, Aquatic Therapy, Dry Needling, Electrical stimulation, Spinal manipulation, Spinal mobilization, Cryotherapy, Moist heat, Compression  bandaging, scar mobilization, Splintting, Taping, Traction, Ultrasound, Ionotophoresis 4mg /ml Dexamethasone, and Manual therapy   PLAN FOR NEXT SESSION: f/u with HEP, postural strength, core and glute strength   Sem Mccaughey, PT 11/27/2021, 1:39 PM

## 2021-12-06 ENCOUNTER — Telehealth (HOSPITAL_COMMUNITY): Payer: Self-pay

## 2021-12-06 ENCOUNTER — Encounter (HOSPITAL_COMMUNITY): Payer: Medicare HMO

## 2021-12-06 NOTE — Telephone Encounter (Signed)
No show #1, called concerning missed apt today. No answer and mailbox full so unable to leave message.   Becky Sax, LPTA/CLT; [place] [phone]

## 2021-12-12 DIAGNOSIS — E103293 Type 1 diabetes mellitus with mild nonproliferative diabetic retinopathy without macular edema, bilateral: Secondary | ICD-10-CM | POA: Diagnosis not present

## 2021-12-12 DIAGNOSIS — H52223 Regular astigmatism, bilateral: Secondary | ICD-10-CM | POA: Diagnosis not present

## 2021-12-12 DIAGNOSIS — H2513 Age-related nuclear cataract, bilateral: Secondary | ICD-10-CM | POA: Diagnosis not present

## 2021-12-12 DIAGNOSIS — Z794 Long term (current) use of insulin: Secondary | ICD-10-CM | POA: Diagnosis not present

## 2021-12-12 DIAGNOSIS — H5203 Hypermetropia, bilateral: Secondary | ICD-10-CM | POA: Diagnosis not present

## 2021-12-13 ENCOUNTER — Encounter (HOSPITAL_COMMUNITY): Payer: Self-pay

## 2021-12-13 ENCOUNTER — Ambulatory Visit (HOSPITAL_COMMUNITY): Payer: Medicare HMO

## 2021-12-13 DIAGNOSIS — R2689 Other abnormalities of gait and mobility: Secondary | ICD-10-CM | POA: Diagnosis not present

## 2021-12-13 DIAGNOSIS — M6281 Muscle weakness (generalized): Secondary | ICD-10-CM

## 2021-12-13 DIAGNOSIS — R293 Abnormal posture: Secondary | ICD-10-CM

## 2021-12-13 DIAGNOSIS — M5459 Other low back pain: Secondary | ICD-10-CM | POA: Diagnosis not present

## 2021-12-13 DIAGNOSIS — R29898 Other symptoms and signs involving the musculoskeletal system: Secondary | ICD-10-CM

## 2021-12-13 NOTE — Therapy (Signed)
OUTPATIENT PHYSICAL THERAPY THORACOLUMBAR EVALUATION   Patient Name: Mitchell [name] MRN: [id number] DOB:May 31, 1950, 71 y.o., male Today's Date: 12/13/2021   PT End of Session - 12/13/21 1316     Visit Number 2    Number of Visits 8    Date for PT Re-Evaluation 12/25/21    Authorization Type [employer]na Medicare  (no vl, no auth)    PT Start Time 1300    PT Stop Time 1344    PT Time Calculation (min) 44 min    Activity Tolerance Patient tolerated treatment well    Behavior During Therapy [facility] for tasks assessed/performed              Past Medical History:  Diagnosis Date   Arthritis    Cellulitis of right lower extremity 05/09/2014   Diabetes mellitus without complication (HCC)    Diabetic foot ulcer (HCC) 07/07/2014   Status post bedside debridement of multiple right plantar ulcers by podiatrist, Dr. Reynolds Bowl.   Hypercholesterolemia    IDA (iron deficiency anemia)    Maggot infestation    right foot ulcer   Obesity 05/09/2014   Tobacco abuse 07/07/2014   Past Surgical History:  Procedure Laterality Date   APPENDECTOMY     APPLICATION OF WOUND VAC Right 10/27/2020   Procedure: APPLICATION OF WOUND VAC;  Surgeon: Nada Libman, MD;  Location: [facility] OR;  Service: Vascular;  Laterality: Right;   ESOPHAGOGASTRODUODENOSCOPY (EGD) WITH PROPOFOL N/A 10/28/2020   Procedure: ESOPHAGOGASTRODUODENOSCOPY (EGD) WITH PROPOFOL;  Surgeon: [name], MD;  Location: [facility] ENDOSCOPY;  Service: Gastroenterology;  Laterality: N/A;   HEMOSTASIS CLIP PLACEMENT  10/28/2020   Procedure: HEMOSTASIS CLIP PLACEMENT;  Surgeon: [name], MD;  Location: [facility] ENDOSCOPY;  Service: Gastroenterology;;   HOT HEMOSTASIS N/A 10/28/2020   Procedure: HOT HEMOSTASIS (ARGON PLASMA COAGULATION/BICAP);  Surgeon: [name], MD;  Location: [facility] ENDOSCOPY;  Service: Gastroenterology;  Laterality: N/A;   PERIPHERAL VASCULAR CATHETERIZATION N/A 07/22/2015   Procedure: Abdominal Aortogram w/Lower Extremity;  Surgeon:  Sherren Kerns, MD;  Location: [facility] INVASIVE CV LAB;  Service: Cardiovascular;  Laterality: N/A;   TRANSMETATARSAL AMPUTATION Right 10/27/2020   Procedure: TRANSMETATARSAL AMPUTATION;  Surgeon: Nada Libman, MD;  Location: [facility] OR;  Service: Vascular;  Laterality: Right;   Patient Active Problem List   Diagnosis Date Noted   Foot osteomyelitis, right (HCC) 10/25/2020   GI bleed 10/25/2020   Blood loss anemia 10/25/2020   Osteomyelitis of right foot (HCC) 10/25/2020   Closed fracture of proximal end of right humerus with routine healing 02/25/2019   Pre-ulcerative calluses 04/20/2018   Myositis 07/08/2014   Diabetic foot ulcer associated with type 2 diabetes mellitus (HCC) 07/07/2014   Tobacco abuse 07/07/2014   Diabetic ulcer of right foot (HCC) 05/12/2014   Pulse weakness    Peripheral neuropathy    Cellulitis of right lower extremity 05/09/2014   Obesity 05/09/2014   Cellulitis of right lower leg 05/09/2014   Hyperglycemia 03/21/2013   Type 2 diabetes mellitus with foot ulcer (HCC) 03/21/2013   Noncompliance with medications due to cost issues 03/21/2013    PCP: Kirstie Peri, MD  REFERRING PROVIDER: Kirstie Peri, MD  REFERRING DIAG: LBP  Rationale for Evaluation and Treatment: Rehabilitation  THERAPY DIAG:  Other low back pain  Muscle weakness (generalized)  Other abnormalities of gait and mobility  Other symptoms and signs involving the musculoskeletal system  Abnormal posture  ONSET DATE: 1 year  SUBJECTIVE:  SUBJECTIVE STATEMENT: Pt reports increased pain with colder weather BLE tightened up.  Has began the HEP and feels they are helpful.  Stated he put Christmas decor yesterday and had to stand up 3 times due to pain, difficult with forward flexion for prolonged period of time.    Eval subjective: Patient states back has been hurting since he was in the hospital for his foot. His back bothers him with bending, chores, getting out of bed and more. Back is rough with trying to do stuff.   PERTINENT HISTORY:  DM, chronic pain, hx foot ulcer/amputation  PAIN:  Are you having pain? Yes: NPRS scale: 4/10 Pain location: low back and BLE Pain description: achey in back and tight BLE Aggravating factors: chores, bending, movement  Relieving factors: none  PRECAUTIONS: None  WEIGHT BEARING RESTRICTIONS: No  FALLS:  Has patient fallen in last 6 months? No  LIVING ENVIRONMENT: Lives with: lives alone Lives in: House/apartment Stairs:  ramp Has following equipment at home: Single point cane  OCCUPATION: Retired  PLOF: Independent  PATIENT GOALS: decrease back pain  NEXT MD VISIT: not sure  OBJECTIVE:   PATIENT SURVEYS:  FOTO 40% function  SCREENING FOR RED FLAGS: Bowel or bladder incontinence: No Spinal tumors: No Cauda equina syndrome: No Compression fracture: No Abdominal aneurysm: No  COGNITION: Overall cognitive status: Within functional limits for tasks assessed     SENSATION: WFL   POSTURE: rounded shoulders, forward head, decreased lumbar lordosis, and increased thoracic kyphosis  PALPATION: TTP lumbar paraspinals   LUMBAR ROM:   AROM eval  Flexion 0% limited  Extension 90% limited  Right lateral flexion 50% limited  Left lateral flexion 50% limited  Right rotation 50% limited  Left rotation 50% limited   (Blank rows = not tested)  LOWER EXTREMITY ROM:   WFL for tasks assessed  Active  Right eval Left eval  Hip flexion    Hip extension    Hip abduction    Hip adduction    Hip internal rotation    Hip external rotation    Knee flexion    Knee extension    Ankle dorsiflexion    Ankle plantarflexion    Ankle inversion    Ankle eversion     (Blank rows = not tested)  LOWER EXTREMITY MMT:    MMT Right eval  Left eval  Hip flexion 4+ 4+  Hip extension 3+ 4-  Hip abduction 3+ 4-  Hip adduction    Hip internal rotation    Hip external rotation    Knee flexion 4+ 5  Knee extension 4+ 5  Ankle dorsiflexion    Ankle plantarflexion    Ankle inversion    Ankle eversion     (Blank rows = not tested)    FUNCTIONAL TESTS:  5 times sit to stand: 22.61 seconds without UE use 2 minute walk test: 375 feet with SPC Transfer STS: labored, requires bilateral UE support  GAIT: Distance walked: 375 feet Assistive device utilized: Single point cane Level of assistance: Modified independence Comments: , gradually increasing trunk flexion, decreased L arm swing, Trendelenburg on L   TODAY'S TREATMENT:  DATE:  12/13/21 STS 10x   Standing: Hip abduction 10x 3"  Seated posture education Rows RTB 2x 10  Supine:  Chin tuck 10 x3" Abdominal sets 10x 5" Bridge 10x   11/27/21  STS 2x 10  Standing hip abduction 2 x 10 bilateral    PATIENT EDUCATION:  Education details: Patient educated on exam findings, POC, scope of PT, HEP, and posture. Person educated: Patient Education method: Explanation, Demonstration, and Handouts Education comprehension: verbalized understanding, returned demonstration, verbal cues required, and tactile cues required  HOME EXERCISE PROGRAM: Access Code: [email or web address]  Date: 11/27/2021 - Sit to Stand  - 2 x daily - 7 x weekly - 2 sets - 10 reps - Standing Hip Abduction with Counter Support  - 2 x daily - 7 x weekly - 2 sets - 10 reps  Access Code: [email or web address] URL: [email or web address] Date: 12/13/2021 Prepared by: Becky Saxasey Tonesha Tsou  Exercises - Sit to Stand  - 2 x daily - 7 x weekly - 2 sets - 10 reps - Standing Hip Abduction with Counter Support  - 2 x daily - 7 x weekly - 2 sets - 10 reps - Correct Seated Posture  - 5 x  daily - 7 x weekly - 1 sets - 2 reps - Seated Scapular Retraction  - 1 x daily - 7 x weekly - 3 sets - 10 reps - 5" hold - Supine Chin Tuck  - 1 x daily - 7 x weekly - 2 sets - 10 reps - 5" hold - Supine Bridge  - 1 x daily - 7 x weekly - 3 sets - 10 reps - 5" hold  ASSESSMENT:  CLINICAL IMPRESSION: Reviewed goals, educated importance of HEP for maximal benefits with therapy, pt able to recall and demonstrate appropriate mechanics.  Session focus on core/proximal strengthening and educated importance of posture to assist with back pain.  Cueing required for breathing with all mat activities.  Added postural strengthening and bridges to HEP with printout given.  OBJECTIVE IMPAIRMENTS: Abnormal gait, decreased activity tolerance, decreased balance, decreased endurance, decreased mobility, difficulty walking, decreased ROM, decreased strength, hypomobility, increased muscle spasms, impaired flexibility, impaired sensation, improper body mechanics, postural dysfunction, and pain.   ACTIVITY LIMITATIONS: carrying, lifting, bending, sitting, standing, squatting, sleeping, stairs, transfers, reach over head, hygiene/grooming, locomotion level, and caring for others  PARTICIPATION LIMITATIONS: meal prep, cleaning, laundry, shopping, community activity, and yard work  PERSONAL FACTORS: Fitness, Time since onset of injury/illness/exacerbation, and 3+ comorbidities: DM, chronic pain, hx foot ulcer/amputation, sedentary, tobacco use  are also affecting patient's functional outcome.   REHAB POTENTIAL: Good  CLINICAL DECISION MAKING: Stable/uncomplicated  EVALUATION COMPLEXITY: Low    GOALS: Goals reviewed with patient? Yes  SHORT TERM GOALS: Target date: 12/18/2021  Patient will be independent with HEP in order to improve functional outcomes. Baseline:  Goal status: IN PROGRESS  2.  Patient will report at least 25% improvement in symptoms for improved quality of life. Baseline: Goal status: IN  PROGRESS    LONG TERM GOALS: Target date: 12/25/2021  Patient will report at least 75% improvement in symptoms for improved quality of life. Baseline:  Goal status: IN PROGRESS  2.  Patient will improve FOTO score by at least 15 points in order to indicate improved tolerance to activity. Baseline: 40% function Goal status: IN PROGRESS  3.  Patient will demonstrate at least 25% improvement in lumbar ROM in all restricted planes for improved ability to move trunk while completing chores. Baseline: see above Goal  status: IN PROGRESS  4.  Patient will be able to ambulate at least 425 feet in in order to demonstrate improved tolerance to activity. Baseline: 375 feet with SPC Goal status: IN PROGRESS  5.  Patient will be able to complete 5x STS in under 14 seconds in order to reduce the risk of falls. Baseline: 22.61 seconds without UE use Goal status: IN PROGRESS  6.  Patient will demonstrate grade of 5/5 MMT grade in all tested musculature as evidence of improved strength to assist with stair ambulation and gait.   Baseline: see above Goal status: IN PROGRESS   PLAN:  PT FREQUENCY: 2x/week  PT DURATION: 4 weeks  PLANNED INTERVENTIONS: Therapeutic exercises, Therapeutic activity, Neuromuscular re-education, Balance training, Gait training, Patient/Family education, Joint manipulation, Joint mobilization, Stair training, Orthotic/Fit training, DME instructions, Aquatic Therapy, Dry Needling, Electrical stimulation, Spinal manipulation, Spinal mobilization, Cryotherapy, Moist heat, Compression bandaging, scar mobilization, Splintting, Taping, Traction, Ultrasound, Ionotophoresis 4mg /ml Dexamethasone, and Manual therapy   PLAN FOR NEXT SESSION: f/u with HEP, postural strength, core and glute strength  , LPTA/CLT; CBIS [phone]  [phone], PTA 12/13/2021, 4:51 PM

## 2021-12-15 ENCOUNTER — Encounter (HOSPITAL_COMMUNITY): Payer: Self-pay

## 2021-12-15 ENCOUNTER — Ambulatory Visit (HOSPITAL_COMMUNITY): Payer: Medicare HMO | Attending: Internal Medicine

## 2021-12-15 DIAGNOSIS — R29898 Other symptoms and signs involving the musculoskeletal system: Secondary | ICD-10-CM | POA: Insufficient documentation

## 2021-12-15 DIAGNOSIS — M6281 Muscle weakness (generalized): Secondary | ICD-10-CM | POA: Diagnosis not present

## 2021-12-15 DIAGNOSIS — R293 Abnormal posture: Secondary | ICD-10-CM | POA: Diagnosis not present

## 2021-12-15 DIAGNOSIS — M5459 Other low back pain: Secondary | ICD-10-CM | POA: Insufficient documentation

## 2021-12-15 DIAGNOSIS — R2689 Other abnormalities of gait and mobility: Secondary | ICD-10-CM | POA: Diagnosis not present

## 2021-12-15 NOTE — Therapy (Signed)
OUTPATIENT PHYSICAL THERAPY THORACOLUMBAR TREATMENT   Patient Name: Mitchell ButtGary Biehn MRN: [id number] DOB:[birth year], 71 y.o., male Today's Date: 12/15/2021   PT End of Session - 12/15/21 1345     Visit Number 3    Number of Visits 8    Date for PT Re-Evaluation 12/25/21    Authorization Type [employer] Medicare  (no vl, no auth)    PT Start Time 1300    PT Stop Time 1338    PT Time Calculation (min) 38 min    Activity Tolerance Patient tolerated treatment well;No increased pain    [facility]/performed               Past Medical History:  Diagnosis Date   Arthritis    Cellulitis of right lower extremity 05/09/2014   Diabetes mellitus without complication (HCC)    Diabetic foot ulcer (HCC) 07/07/2014   Status post bedside debridement of multiple right plantar ulcers by podiatrist, Dr. Reynolds BowlLu.   Hypercholesterolemia    IDA (iron deficiency anemia)    Maggot infestation    right foot ulcer   Obesity 05/09/2014   Tobacco abuse 07/07/2014   Past Surgical History:  Procedure Laterality Date   APPENDECTOMY     APPLICATION OF WOUND VAC Right 10/27/2020   Procedure: APPLICATION OF WOUND VAC;  Surgeon: Nada LibmanBrabham, Vance W, MD;  Location: [facility] OR;  Service: Vascular;  Laterality: Right;   ESOPHAGOGASTRODUODENOSCOPY (EGD) WITH PROPOFOL N/A 10/28/2020   Procedure: ESOPHAGOGASTRODUODENOSCOPY (EGD) WITH PROPOFOL;  Surgeon: Imogene Burnorsey, Ying C, MD;  [facility];  Service: Gastroenterology;  Laterality: N/A;   HEMOSTASIS CLIP PLACEMENT  10/28/2020   Procedure: HEMOSTASIS CLIP PLACEMENT;  Surgeon: Imogene Burnorsey, Ying C, MD;  [facility];  Service: Gastroenterology;;   HOT HEMOSTASIS N/A 10/28/2020   Procedure: HOT HEMOSTASIS (ARGON PLASMA COAGULATION/BICAP);  Surgeon: Imogene Burnorsey, Ying C, MD;  [facility];  Service: Gastroenterology;  Laterality: N/A;   PERIPHERAL VASCULAR CATHETERIZATION N/A 07/22/2015   Procedure: Abdominal Aortogram w/Lower  Extremity;  Surgeon: Sherren Kernsharles E Fields, MD;  [facility];  Service: Cardiovascular;  Laterality: N/A;   TRANSMETATARSAL AMPUTATION Right 10/27/2020   Procedure: TRANSMETATARSAL AMPUTATION;  Surgeon: Nada LibmanBrabham, Vance W, MD;  [facility];  Service: Vascular;  Laterality: Right;   Patient Active Problem List   Diagnosis Date Noted   Foot osteomyelitis, right (HCC) 10/25/2020   GI bleed 10/25/2020   Blood loss anemia 10/25/2020   Osteomyelitis of right foot (HCC) 10/25/2020   Closed fracture of proximal end of right humerus with routine healing 02/25/2019   Pre-ulcerative calluses 04/20/2018   Myositis 07/08/2014   Diabetic foot ulcer associated with type 2 diabetes mellitus (HCC) 07/07/2014   Tobacco abuse 07/07/2014   Diabetic ulcer of right foot (HCC) 05/12/2014   Pulse weakness    Peripheral neuropathy    Cellulitis of right lower extremity 05/09/2014   Obesity 05/09/2014   Cellulitis of right lower leg 05/09/2014   Hyperglycemia 03/21/2013   Type 2 diabetes mellitus with foot ulcer (HCC) 03/21/2013   Noncompliance with medications due to cost issues 03/21/2013    PCP: Kirstie PeriShah, Ashish, MD  REFERRING PROVIDER: Kirstie PeriShah, Ashish, MD  REFERRING DIAG: LBP  Rationale for Evaluation and Treatment: Rehabilitation  THERAPY DIAG:  Other low back pain  Other abnormalities of gait and mobility  Other symptoms and signs involving the musculoskeletal system  Abnormal posture  Muscle weakness (generalized)  ONSET DATE: 1 year  SUBJECTIVE:  SUBJECTIVE STATEMENT: Pt stated he is sore today and feels the colder weather tightens legs.  Reports compliance with HEP.  Stated he had busy morning paying bills today.  Eval subjective: Patient states back has been hurting since he was in the  hospital for his foot. His back bothers him with bending, chores, getting out of bed and more. Back is rough with trying to do stuff.   PERTINENT HISTORY:  DM, chronic pain, hx foot ulcer/amputation  PAIN:  Are you having pain? Yes: NPRS scale: 6/10 Pain location: low back and BLE Pain description: achey in back and tight BLE Aggravating factors: chores, bending, movement  Relieving factors: none  PRECAUTIONS: None  WEIGHT BEARING RESTRICTIONS: No  FALLS:  Has patient fallen in last 6 months? No  LIVING ENVIRONMENT: Lives with: lives alone Lives in: House/apartment Stairs:  ramp Has following equipment at home: Single point cane  OCCUPATION: Retired  PLOF: Independent  PATIENT GOALS: decrease back pain  NEXT MD VISIT: not sure  OBJECTIVE:   PATIENT SURVEYS:  FOTO 40% function  SCREENING FOR RED FLAGS: Bowel or bladder incontinence: No Spinal tumors: No Cauda equina syndrome: No Compression fracture: No Abdominal aneurysm: No  COGNITION: Overall cognitive status: Within functional limits for tasks assessed     SENSATION: WFL   POSTURE: rounded shoulders, forward head, decreased lumbar lordosis, and increased thoracic kyphosis  PALPATION: TTP lumbar paraspinals   LUMBAR ROM:   AROM eval  Flexion 0% limited  Extension 90% limited  Right lateral flexion 50% limited  Left lateral flexion 50% limited  Right rotation 50% limited  Left rotation 50% limited   (Blank rows = not tested)  LOWER EXTREMITY ROM:   WFL for tasks assessed  Active  Right eval Left eval  Hip flexion    Hip extension    Hip abduction    Hip adduction    Hip internal rotation    Hip external rotation    Knee flexion    Knee extension    Ankle dorsiflexion    Ankle plantarflexion    Ankle inversion    Ankle eversion     (Blank rows = not tested)  LOWER EXTREMITY MMT:    MMT Right eval Left eval  Hip flexion 4+ 4+  Hip extension 3+ 4-  Hip abduction 3+ 4-  Hip  adduction    Hip internal rotation    Hip external rotation    Knee flexion 4+ 5  Knee extension 4+ 5  Ankle dorsiflexion    Ankle plantarflexion    Ankle inversion    Ankle eversion     (Blank rows = not tested)    FUNCTIONAL TESTS:  5 times sit to stand: 22.61 seconds without UE use 2 minute walk test: 375 feet with SPC Transfer STS: labored, requires bilateral UE support  GAIT: Distance walked: 375 feet Assistive device utilized: Single point cane Level of assistance: Modified independence Comments: , gradually increasing trunk flexion, decreased L arm swing, Trendelenburg on L   TODAY'S TREATMENT:  DATE:  12/15/21 Log rolling  Supine: LTR 5x 10" Decompression 2-5 5x 5" Bridge 10x5" SKTC 2x 30"  Sidelying: Clam with GTB 10x 5" 12/13/21 STS 10x   Standing: Hip abduction 10x 3"  Seated posture education Rows RTB 2x 10  Supine:  Chin tuck 10 x3" Abdominal sets 10x 5" Bridge 10x   11/27/21  STS 2x 10  Standing hip abduction 2 x 10 bilateral    PATIENT EDUCATION:  Education details: Patient educated on exam findings, POC, scope of PT, HEP, and posture. Person educated: Patient Education method: Explanation, Demonstration, and Handouts Education comprehension: verbalized understanding, returned demonstration, verbal cues required, and tactile cues required  HOME EXERCISE PROGRAM: Access Code: [email or web address]  Date: 11/27/2021 - Sit to Stand  - 2 x daily - 7 x weekly - 2 sets - 10 reps - Standing Hip Abduction with Counter Support  - 2 x daily - 7 x weekly - 2 sets - 10 reps  Access Code: [email or web address] URL: [email or web address] Date: 12/13/2021 Prepared by: Becky Sax  Exercises - Sit to Stand  - 2 x daily - 7 x weekly - 2 sets - 10 reps - Standing Hip Abduction with Counter Support  - 2 x daily - 7 x weekly -  2 sets - 10 reps - Correct Seated Posture  - 5 x daily - 7 x weekly - 1 sets - 2 reps - Seated Scapular Retraction  - 1 x daily - 7 x weekly - 3 sets - 10 reps - 5" hold - Supine Chin Tuck  - 1 x daily - 7 x weekly - 2 sets - 10 reps - 5" hold - Supine Bridge  - 1 x daily - 7 x weekly - 3 sets - 10 reps - 5" hold  12/15/21: log rolling, LTR, decompression ASSESSMENT:  CLINICAL IMPRESSION: Educated log rolling for back control and increased ease with bed mobility.  Added stretches to address c/o tightness in lower back and legs.  Continued core/proximal and postural strengthening with multimodal cueing to improve mechanics through session especially with cervical retraction.  EOS pt reports pain reduced and feels more mobile.   OBJECTIVE IMPAIRMENTS: Abnormal gait, decreased activity tolerance, decreased balance, decreased endurance, decreased mobility, difficulty walking, decreased ROM, decreased strength, hypomobility, increased muscle spasms, impaired flexibility, impaired sensation, improper body mechanics, postural dysfunction, and pain.   ACTIVITY LIMITATIONS: carrying, lifting, bending, sitting, standing, squatting, sleeping, stairs, transfers, reach over head, hygiene/grooming, locomotion level, and caring for others  PARTICIPATION LIMITATIONS: meal prep, cleaning, laundry, shopping, community activity, and yard work  PERSONAL FACTORS: Fitness, Time since onset of injury/illness/exacerbation, and 3+ comorbidities: DM, chronic pain, hx foot ulcer/amputation, sedentary, tobacco use  are also affecting patient's functional outcome.   REHAB POTENTIAL: Good  CLINICAL DECISION MAKING: Stable/uncomplicated  EVALUATION COMPLEXITY: Low    GOALS: Goals reviewed with patient? Yes  SHORT TERM GOALS: Target date: 12/18/2021  Patient will be independent with HEP in order to improve functional outcomes. Baseline:  Goal status: IN PROGRESS  2.  Patient will report at least 25% improvement  in symptoms for improved quality of life. Baseline: Goal status: IN PROGRESS    LONG TERM GOALS: Target date: 12/25/2021  Patient will report at least 75% improvement in symptoms for improved quality of life. Baseline:  Goal status: IN PROGRESS  2.  Patient will improve FOTO score by at least 15 points in order to indicate improved tolerance to activity. Baseline: 40% function Goal status: IN PROGRESS  3.  Patient will demonstrate at least 25% improvement in lumbar ROM in all restricted planes for improved ability to move trunk while completing chores. Baseline: see above Goal status: IN PROGRESS  4.  Patient will be able to ambulate at least 425 feet in in order to demonstrate improved tolerance to activity. Baseline: 375 feet with SPC Goal status: IN PROGRESS  5.  Patient will be able to complete 5x STS in under 14 seconds in order to reduce the risk of falls. Baseline: 22.61 seconds without UE use Goal status: IN PROGRESS  6.  Patient will demonstrate grade of 5/5 MMT grade in all tested musculature as evidence of improved strength to assist with stair ambulation and gait.   Baseline: see above Goal status: IN PROGRESS   PLAN:  PT FREQUENCY: 2x/week  PT DURATION: 4 weeks  PLANNED INTERVENTIONS: Therapeutic exercises, Therapeutic activity, Neuromuscular re-education, Balance training, Gait training, Patient/Family education, Joint manipulation, Joint mobilization, Stair training, Orthotic/Fit training, DME instructions, Aquatic Therapy, Dry Needling, Electrical stimulation, Spinal manipulation, Spinal mobilization, Cryotherapy, Moist heat, Compression bandaging, scar mobilization, Splintting, Taping, Traction, Ultrasound, Ionotophoresis 4mg /ml Dexamethasone, and Manual therapy   PLAN FOR NEXT SESSION: f/u with HEP, postural strength, core and glute strength.  Add X to V, wall arch, sidelying abduction and progress core strength.   , LPTA/CLT;  CBIS [id number]  [phone], PTA 12/15/2021, 1:47 PM

## 2021-12-18 ENCOUNTER — Encounter (HOSPITAL_COMMUNITY): Payer: Self-pay | Admitting: Physical Therapy

## 2021-12-18 ENCOUNTER — Ambulatory Visit (HOSPITAL_COMMUNITY): Payer: Medicare HMO | Admitting: Physical Therapy

## 2021-12-18 DIAGNOSIS — R293 Abnormal posture: Secondary | ICD-10-CM

## 2021-12-18 DIAGNOSIS — R2689 Other abnormalities of gait and mobility: Secondary | ICD-10-CM

## 2021-12-18 DIAGNOSIS — M5459 Other low back pain: Secondary | ICD-10-CM

## 2021-12-18 DIAGNOSIS — M6281 Muscle weakness (generalized): Secondary | ICD-10-CM | POA: Diagnosis not present

## 2021-12-18 DIAGNOSIS — R29898 Other symptoms and signs involving the musculoskeletal system: Secondary | ICD-10-CM | POA: Diagnosis not present

## 2021-12-18 NOTE — Therapy (Signed)
OUTPATIENT PHYSICAL THERAPY THORACOLUMBAR TREATMENT   Patient Name: Mitchell [name] MRN: [id number] DOB:1950/09/09, 71 y.o., male Today's Date: 12/18/2021   PT End of Session - 12/18/21 1429     Visit Number 4    Number of Visits 8    Date for PT Re-Evaluation 12/25/21    Authorization Type [employer]na Medicare  (no vl, no auth)    PT Start Time 1429    PT Stop Time 1510    PT Time Calculation (min) 41 min    Activity Tolerance Patient tolerated treatment well;No increased pain    Behavior During Therapy [facility] for tasks assessed/performed               Past Medical History:  Diagnosis Date   Arthritis    Cellulitis of right lower extremity 05/09/2014   Diabetes mellitus without complication (HCC)    Diabetic foot ulcer (HCC) 07/07/2014   Status post bedside debridement of multiple right plantar ulcers by podiatrist, Dr. Reynolds Bowl.   Hypercholesterolemia    IDA (iron deficiency anemia)    Maggot infestation    right foot ulcer   Obesity 05/09/2014   Tobacco abuse 07/07/2014   Past Surgical History:  Procedure Laterality Date   APPENDECTOMY     APPLICATION OF WOUND VAC Right 10/27/2020   Procedure: APPLICATION OF WOUND VAC;  Surgeon: Nada Libman, MD;  Location: [facility] OR;  Service: Vascular;  Laterality: Right;   ESOPHAGOGASTRODUODENOSCOPY (EGD) WITH PROPOFOL N/A 10/28/2020   Procedure: ESOPHAGOGASTRODUODENOSCOPY (EGD) WITH PROPOFOL;  Surgeon: [name], MD;  [facility] ENDOSCOPY;  Service: Gastroenterology;  Laterality: N/A;   HEMOSTASIS CLIP PLACEMENT  10/28/2020   Procedure: HEMOSTASIS CLIP PLACEMENT;  Surgeon: [name], MD;  [facility]OSCOPY;  Service: Gastroenterology;;   HOT HEMOSTASIS N/A 10/28/2020   Procedure: HOT HEMOSTASIS (ARGON PLASMA COAGULATION/BICAP);  Surgeon: [name], MD;  Location: [facility] ENDOSCOPY;  Service: Gastroenterology;  Laterality: N/A;   PERIPHERAL VASCULAR CATHETERIZATION N/A 07/22/2015   Procedure: Abdominal Aortogram w/Lower  Extremity;  Surgeon: Sherren Kerns, MD;  Location: [facility] INVASIVE CV LAB;  Service: Cardiovascular;  Laterality: N/A;   TRANSMETATARSAL AMPUTATION Right 10/27/2020   Procedure: TRANSMETATARSAL AMPUTATION;  Surgeon: Nada Libman, MD;  Location: [facility] OR;  Service: Vascular;  Laterality: Right;   Patient Active Problem List   Diagnosis Date Noted   Foot osteomyelitis, right (HCC) 10/25/2020   GI bleed 10/25/2020   Blood loss anemia 10/25/2020   Osteomyelitis of right foot (HCC) 10/25/2020   Closed fracture of proximal end of right humerus with routine healing 02/25/2019   Pre-ulcerative calluses 04/20/2018   Myositis 07/08/2014   Diabetic foot ulcer associated with type 2 diabetes mellitus (HCC) 07/07/2014   Tobacco abuse 07/07/2014   Diabetic ulcer of right foot (HCC) 05/12/2014   Pulse weakness    Peripheral neuropathy    Cellulitis of right lower extremity 05/09/2014   Obesity 05/09/2014   Cellulitis of right lower leg 05/09/2014   Hyperglycemia 03/21/2013   Type 2 diabetes mellitus with foot ulcer (HCC) 03/21/2013   Noncompliance with medications due to cost issues 03/21/2013    PCP: Kirstie Peri, MD  REFERRING PROVIDER: Kirstie Peri, MD  REFERRING DIAG: LBP  Rationale for Evaluation and Treatment: Rehabilitation  THERAPY DIAG:  Other low back pain  Other abnormalities of gait and mobility  Other symptoms and signs involving the musculoskeletal system  Abnormal posture  Muscle weakness (generalized)  ONSET DATE: 1 year  SUBJECTIVE:  SUBJECTIVE STATEMENT: Pt states back is feeling some better. Legs are bothering him from the cold. Has issues with his hamstrings being sore.   Eval subjective: Patient states back has been hurting since he was in the hospital for his foot. His back  bothers him with bending, chores, getting out of bed and more. Back is rough with trying to do stuff.   PERTINENT HISTORY:  DM, chronic pain, hx foot ulcer/amputation  PAIN:  Are you having pain? Yes: NPRS scale: 3/10 Pain location: low back and BLE Pain description: achey in back and tight BLE Aggravating factors: chores, bending, movement  Relieving factors: none  PRECAUTIONS: None  WEIGHT BEARING RESTRICTIONS: No  FALLS:  Has patient fallen in last 6 months? No  LIVING ENVIRONMENT: Lives with: lives alone Lives in: House/apartment Stairs:  ramp Has following equipment at home: Single point cane  OCCUPATION: Retired  PLOF: Independent  PATIENT GOALS: decrease back pain  NEXT MD VISIT: not sure  OBJECTIVE:   PATIENT SURVEYS:  FOTO 40% function  SCREENING FOR RED FLAGS: Bowel or bladder incontinence: No Spinal tumors: No Cauda equina syndrome: No Compression fracture: No Abdominal aneurysm: No  COGNITION: Overall cognitive status: Within functional limits for tasks assessed     SENSATION: WFL   POSTURE: rounded shoulders, forward head, decreased lumbar lordosis, and increased thoracic kyphosis  PALPATION: TTP lumbar paraspinals   LUMBAR ROM:   AROM eval  Flexion 0% limited  Extension 90% limited  Right lateral flexion 50% limited  Left lateral flexion 50% limited  Right rotation 50% limited  Left rotation 50% limited   (Blank rows = not tested)  LOWER EXTREMITY ROM:   WFL for tasks assessed  Active  Right eval Left eval  Hip flexion    Hip extension    Hip abduction    Hip adduction    Hip internal rotation    Hip external rotation    Knee flexion    Knee extension    Ankle dorsiflexion    Ankle plantarflexion    Ankle inversion    Ankle eversion     (Blank rows = not tested)  LOWER EXTREMITY MMT:    MMT Right eval Left eval  Hip flexion 4+ 4+  Hip extension 3+ 4-  Hip abduction 3+ 4-  Hip adduction    Hip internal  rotation    Hip external rotation    Knee flexion 4+ 5  Knee extension 4+ 5  Ankle dorsiflexion    Ankle plantarflexion    Ankle inversion    Ankle eversion     (Blank rows = not tested)    FUNCTIONAL TESTS:  5 times sit to stand: 22.61 seconds without UE use 2 minute walk test: 375 feet with SPC Transfer STS: labored, requires bilateral UE support  GAIT: Distance walked: 375 feet Assistive device utilized: Single point cane Level of assistance: Modified independence Comments: , gradually increasing trunk flexion, decreased L arm swing, Trendelenburg on L   TODAY'S TREATMENT:  DATE:  12/18/21 LTR 5 x 10 second holds Bridge 10 x 5 second holds DKTC with green ball 1x 10 5 second holds Sidelying hip abduction 2x 10 bilateral  Supine active hamstring stretch with towel at knee 5 x 10 second holds bilateral  X to V  2x 10  STS 2 x 10   12/15/21 Log rolling  Supine: LTR 5x 10" Decompression 2-5 5x 5" Bridge 10x5" SKTC 2x 30"  Sidelying: Clam with GTB 10x 5"  12/13/21 STS 10x   Standing: Hip abduction 10x 3"  Seated posture education Rows RTB 2x 10  Supine:  Chin tuck 10 x3" Abdominal sets 10x 5" Bridge 10x   11/27/21  STS 2x 10  Standing hip abduction 2 x 10 bilateral    PATIENT EDUCATION:  Education details: 12/18/21: HEP;  EVAL: Patient educated on exam findings, POC, scope of PT, HEP, and posture. Person educated: Patient Education method: Explanation, Demonstration, and Handouts Education comprehension: verbalized understanding, returned demonstration, verbal cues required, and tactile cues required  HOME EXERCISE PROGRAM: Access Code: [email or web address]  Date: 11/27/2021 - Sit to Stand  - 2 x daily - 7 x weekly - 2 sets - 10 reps - Standing Hip Abduction with Counter Support  - 2 x daily - 7 x weekly - 2 sets - 10  reps  Date: 12/13/2021 - Sit to Stand  - 2 x daily - 7 x weekly - 2 sets - 10 reps - Standing Hip Abduction with Counter Support  - 2 x daily - 7 x weekly - 2 sets - 10 reps - Correct Seated Posture  - 5 x daily - 7 x weekly - 1 sets - 2 reps - Seated Scapular Retraction  - 1 x daily - 7 x weekly - 3 sets - 10 reps - 5" hold - Supine Chin Tuck  - 1 x daily - 7 x weekly - 2 sets - 10 reps - 5" hold - Supine Bridge  - 1 x daily - 7 x weekly - 3 sets - 10 reps - 5" hold  12/15/21: log rolling, LTR, decompression ASSESSMENT:  CLINICAL IMPRESSION: Patient performs previously completed exercises with minimal cueing for mechanics. Patient tends to hold hip in slightly flexed positioning with hip abduction exercise despite multimodal cueing. Demonstrates improving strength with STS exercise. Notes moderate fatigue at EOS. Patient will continue to benefit from physical therapy in order to improve function and reduce impairment.    OBJECTIVE IMPAIRMENTS: Abnormal gait, decreased activity tolerance, decreased balance, decreased endurance, decreased mobility, difficulty walking, decreased ROM, decreased strength, hypomobility, increased muscle spasms, impaired flexibility, impaired sensation, improper body mechanics, postural dysfunction, and pain.   ACTIVITY LIMITATIONS: carrying, lifting, bending, sitting, standing, squatting, sleeping, stairs, transfers, reach over head, hygiene/grooming, locomotion level, and caring for others  PARTICIPATION LIMITATIONS: meal prep, cleaning, laundry, shopping, community activity, and yard work  PERSONAL FACTORS: Fitness, Time since onset of injury/illness/exacerbation, and 3+ comorbidities: DM, chronic pain, hx foot ulcer/amputation, sedentary, tobacco use  are also affecting patient's functional outcome.   REHAB POTENTIAL: Good  CLINICAL DECISION MAKING: Stable/uncomplicated  EVALUATION COMPLEXITY: Low    GOALS: Goals reviewed with patient? Yes  SHORT TERM  GOALS: Target date: 12/18/2021  Patient will be independent with HEP in order to improve functional outcomes. Baseline:  Goal status: IN PROGRESS  2.  Patient will report at least 25% improvement in symptoms for improved quality of life. Baseline: Goal status: IN PROGRESS    LONG TERM GOALS: Target date:  12/25/2021  Patient will report at least 75% improvement in symptoms for improved quality of life. Baseline:  Goal status: IN PROGRESS  2.  Patient will improve FOTO score by at least 15 points in order to indicate improved tolerance to activity. Baseline: 40% function Goal status: IN PROGRESS  3.  Patient will demonstrate at least 25% improvement in lumbar ROM in all restricted planes for improved ability to move trunk while completing chores. Baseline: see above Goal status: IN PROGRESS  4.  Patient will be able to ambulate at least 425 feet in in order to demonstrate improved tolerance to activity. Baseline: 375 feet with SPC Goal status: IN PROGRESS  5.  Patient will be able to complete 5x STS in under 14 seconds in order to reduce the risk of falls. Baseline: 22.61 seconds without UE use Goal status: IN PROGRESS  6.  Patient will demonstrate grade of 5/5 MMT grade in all tested musculature as evidence of improved strength to assist with stair ambulation and gait.   Baseline: see above Goal status: IN PROGRESS   PLAN:  PT FREQUENCY: 2x/week  PT DURATION: 4 weeks  PLANNED INTERVENTIONS: Therapeutic exercises, Therapeutic activity, Neuromuscular re-education, Balance training, Gait training, Patient/Family education, Joint manipulation, Joint mobilization, Stair training, Orthotic/Fit training, DME instructions, Aquatic Therapy, Dry Needling, Electrical stimulation, Spinal manipulation, Spinal mobilization, Cryotherapy, Moist heat, Compression bandaging, scar mobilization, Splintting, Taping, Traction, Ultrasound, Ionotophoresis 4mg /ml Dexamethasone, and Manual  therapy   PLAN FOR NEXT SESSION: f/u with HEP, postural strength, core and glute strength.  Add wall arch and progress core strength.     Nichole Neyer, PT 12/18/2021, 3:08 PM

## 2021-12-20 ENCOUNTER — Encounter (HOSPITAL_COMMUNITY): Payer: Medicare HMO | Admitting: Physical Therapy

## 2021-12-23 DIAGNOSIS — E1129 Type 2 diabetes mellitus with other diabetic kidney complication: Secondary | ICD-10-CM | POA: Diagnosis not present

## 2021-12-25 DIAGNOSIS — Z713 Dietary counseling and surveillance: Secondary | ICD-10-CM | POA: Diagnosis not present

## 2021-12-25 DIAGNOSIS — Z0000 Encounter for general adult medical examination without abnormal findings: Secondary | ICD-10-CM | POA: Diagnosis not present

## 2021-12-25 DIAGNOSIS — Z6839 Body mass index (BMI) 39.0-39.9, adult: Secondary | ICD-10-CM | POA: Diagnosis not present

## 2021-12-25 DIAGNOSIS — Z299 Encounter for prophylactic measures, unspecified: Secondary | ICD-10-CM | POA: Diagnosis not present

## 2021-12-25 DIAGNOSIS — I10 Essential (primary) hypertension: Secondary | ICD-10-CM | POA: Diagnosis not present

## 2021-12-26 ENCOUNTER — Encounter (HOSPITAL_COMMUNITY): Payer: Self-pay | Admitting: Physical Therapy

## 2021-12-26 ENCOUNTER — Ambulatory Visit (HOSPITAL_COMMUNITY): Payer: Medicare HMO | Admitting: Physical Therapy

## 2021-12-26 DIAGNOSIS — R293 Abnormal posture: Secondary | ICD-10-CM

## 2021-12-26 DIAGNOSIS — R2689 Other abnormalities of gait and mobility: Secondary | ICD-10-CM

## 2021-12-26 DIAGNOSIS — M6281 Muscle weakness (generalized): Secondary | ICD-10-CM | POA: Diagnosis not present

## 2021-12-26 DIAGNOSIS — R29898 Other symptoms and signs involving the musculoskeletal system: Secondary | ICD-10-CM

## 2021-12-26 DIAGNOSIS — M5459 Other low back pain: Secondary | ICD-10-CM | POA: Diagnosis not present

## 2021-12-26 NOTE — Therapy (Signed)
OUTPATIENT PHYSICAL THERAPY THORACOLUMBAR TREATMENT   Patient Name: Mitchell [name] MRN: [id number] DOB:Nov 15, 1950, 71 y.o., male Today's Date: 12/26/2021  Progress Note   Reporting Period 11/27/21 to 12/26/21   See note below for Objective Data and Assessment of Progress/Goals    PT End of Session - 12/26/21 1344     Visit Number 5    Number of Visits 16    Date for PT Re-Evaluation 01/23/22    Authorization Type [employer]  (no vl, no auth)    PT Start Time 1345    PT Stop Time 1425    PT Time Calculation (min) 40 min    Activity Tolerance Patient tolerated treatment well;No increased pain    Behavior During Therapy [facility] for tasks assessed/performed               Past Medical History:  Diagnosis Date   Arthritis    Cellulitis of right lower extremity 05/09/2014   Diabetes mellitus without complication (HCC)    Diabetic foot ulcer ([place]) 07/07/2014   Status post bedside debridement of multiple right plantar ulcers by podiatrist, Dr. Laurena Spies.   Hypercholesterolemia    IDA (iron deficiency anemia)    Maggot infestation    right foot ulcer   Obesity 05/09/2014   Tobacco abuse 07/07/2014   Past Surgical History:  Procedure Laterality Date   APPENDECTOMY     APPLICATION OF WOUND VAC Right 10/27/2020   Procedure: APPLICATION OF WOUND VAC;  Surgeon: Serafina Mitchell, MD;  Location: [facility] OR;  Service: Vascular;  Laterality: Right;   ESOPHAGOGASTRODUODENOSCOPY (EGD) WITH PROPOFOL N/A 10/28/2020   Procedure: ESOPHAGOGASTRODUODENOSCOPY (EGD) WITH PROPOFOL;  Surgeon: Sharyn Creamer, MD;  Location: [place];  Service: Gastroenterology;  Laterality: N/A;   HEMOSTASIS CLIP PLACEMENT  10/28/2020   Procedure: HEMOSTASIS CLIP PLACEMENT;  Surgeon: Sharyn Creamer, MD;  Location: [facility] ENDOSCOPY;  Service: Gastroenterology;;   HOT HEMOSTASIS N/A 10/28/2020   Procedure: HOT HEMOSTASIS (ARGON PLASMA COAGULATION/BICAP);  Surgeon: Sharyn Creamer, MD;  Location: [place];  Service:  Gastroenterology;  Laterality: N/A;   PERIPHERAL VASCULAR CATHETERIZATION N/A 07/22/2015   Procedure: Abdominal Aortogram w/Lower Extremity;  Surgeon: Elam Dutch, MD;  Location: [facility] CV LAB;  Service: Cardiovascular;  Laterality: N/A;   TRANSMETATARSAL AMPUTATION Right 10/27/2020   Procedure: TRANSMETATARSAL AMPUTATION;  Surgeon: Serafina Mitchell, MD;  Location: [facility] OR;  Service: Vascular;  Laterality: Right;   Patient Active Problem List   Diagnosis Date Noted   Foot osteomyelitis, right ([place]) 10/25/2020   GI bleed 10/25/2020   Blood loss anemia 10/25/2020   Osteomyelitis of right foot ([place]) 10/25/2020   Closed fracture of proximal end of right humerus with routine healing 02/25/2019   Pre-ulcerative calluses 04/20/2018   Myositis 07/08/2014   Diabetic foot ulcer associated with type 2 diabetes mellitus ([place]) 07/07/2014   Tobacco abuse 07/07/2014   Diabetic ulcer of right foot ([place]) 05/12/2014   Pulse weakness    Peripheral neuropathy    Cellulitis of right lower extremity 05/09/2014   Obesity 05/09/2014   Cellulitis of right lower leg 05/09/2014   Hyperglycemia 03/21/2013   Type 2 diabetes mellitus with foot ulcer ([place]) 03/21/2013   Noncompliance with medications due to cost issues 03/21/2013    PCP: Monico Blitz, MD  REFERRING PROVIDER: Monico Blitz, MD  REFERRING DIAG: LBP  Rationale for Evaluation and Treatment: Rehabilitation  THERAPY DIAG:  Other low back pain  Other abnormalities of gait and mobility  Other symptoms and signs  involving the musculoskeletal system  Abnormal posture  Muscle weakness (generalized)  ONSET DATE: 1 year  SUBJECTIVE:                                                                                                                                                                                           SUBJECTIVE STATEMENT: Pt states he was sore for about 3 days after last session. Patient states 50% improvement with PT  intervention. Getting mobility back to a certain extent but continued back pain. He feels like he is getting better. Patient states pain and stiffness mostly in mornings. Worse today than last few days. He has been working on [employer]. Feels like he needs more help with PT.   Eval subjective: Patient states back has been hurting since he was in the hospital for his foot. His back bothers him with bending, chores, getting out of bed and more. Back is rough with trying to do stuff.   PERTINENT HISTORY:  DM, chronic pain, hx foot ulcer/amputation  PAIN:  Are you having pain? Yes: NPRS scale: 4-5/10 Pain location: low back and BLE Pain description: achey in back and tight BLE Aggravating factors: chores, bending, movement  Relieving factors: none  PRECAUTIONS: None  WEIGHT BEARING RESTRICTIONS: No  FALLS:  Has patient fallen in last 6 months? No  LIVING ENVIRONMENT: Lives with: lives alone Lives in: House/apartment Stairs:  ramp Has following equipment at home: Single point cane  OCCUPATION: Retired  PLOF: Independent  PATIENT GOALS: decrease back pain  NEXT MD VISIT: not sure  OBJECTIVE:   PATIENT SURVEYS:  FOTO 40% function  12/26/21: 63% function  SCREENING FOR RED FLAGS: Bowel or bladder incontinence: No Spinal tumors: No Cauda equina syndrome: No Compression fracture: No Abdominal aneurysm: No  COGNITION: Overall cognitive status: Within functional limits for tasks assessed     SENSATION: WFL   POSTURE: rounded shoulders, forward head, decreased lumbar lordosis, and increased thoracic kyphosis  PALPATION: TTP lumbar paraspinals   LUMBAR ROM:   AROM eval 12/26/21  Flexion 0% limited 0% limited  Extension 90% limited 75% limited  Right lateral flexion 50% limited 25% limited  Left lateral flexion 50% limited 25% limited  Right rotation 50% limited 25% limited  Left rotation 50% limited 25% limited   (Blank rows = not tested)  LOWER EXTREMITY ROM:   WFL  for tasks assessed  Active  Right eval Left eval  Hip flexion    Hip extension    Hip abduction    Hip adduction    Hip internal rotation    Hip external rotation    Knee flexion  Knee extension    Ankle dorsiflexion    Ankle plantarflexion    Ankle inversion    Ankle eversion     (Blank rows = not tested)  LOWER EXTREMITY MMT:    MMT Right eval Left eval Right 12/26/21 Left 12/26/21  Hip flexion 4+ 4+ 5 5  Hip extension 3+ 4- 4+ 4+  Hip abduction 3+ 4- 4+ 5  Hip adduction      Hip internal rotation      Hip external rotation      Knee flexion 4+ _0 Knee extension 4+ _1 Ankle dorsiflexion      Ankle plantarflexion      Ankle inversion      Ankle eversion       (Blank rows = not tested)    FUNCTIONAL TESTS:  5 times sit to stand: 22.61 seconds without UE use 2 minute walk test: 375 feet with SPC Transfer STS: labored, requires bilateral UE support  GAIT: Distance walked: 375 feet Assistive device utilized: Single point cane Level of assistance: Modified independence Comments: 2MWT, gradually increasing trunk flexion, decreased L arm swing, Trendelenburg on L   Reassessment 12/26/21 FUNCTIONAL TESTS:  5 times sit to stand: 14.09 seconds without UE use 2 minute walk test: 405 feet with SPC Transfer STS: labored, requires bilateral UE support  GAIT: Distance walked: 405 feet Assistive device utilized: Single point cane Level of assistance: Modified independence Comments: 2MWT, gradually increasing trunk flexion, decreased foot clearance bilaterally   TODAY'S TREATMENT:                                                                                                                              DATE:  12/26/21 Reassessment   12/18/21 LTR 5 x 10 second holds Bridge 10 x 5 second holds DKTC with green ball 1x 10 5 second holds Sidelying hip abduction 2x 10 bilateral  Supine active hamstring stretch with towel at knee 5 x 10 second holds  bilateral  X to V  2x 10  STS 2 x 10   12/15/21 Log rolling  Supine: LTR 5x 10" Decompression 2-5 5x 5" Bridge 10x5" SKTC 2x 30"  Sidelying: Clam with GTB 10x 5"  12/13/21 STS 10x   Standing: Hip abduction 10x 3"  Seated posture education Rows RTB 2x 10  Supine:  Chin tuck 10 x3" Abdominal sets 10x 5" Bridge 10x   11/27/21  STS 2x 10  Standing hip abduction 2 x 10 bilateral    PATIENT EDUCATION:  Education details: 12/18/21: HEP;  EVAL: Patient educated on exam findings, POC, scope of PT, HEP, and posture. Person educated: Patient Education method: Explanation, Demonstration, and Handouts Education comprehension: verbalized understanding, returned demonstration, verbal cues required, and tactile cues required  HOME EXERCISE PROGRAM: Access Code: [email or web address]  Date: 11/27/2021 - Sit to Stand  - 2 x daily - 7 x weekly - 2 sets - 10 reps -  Standing Hip Abduction with Counter Support  - 2 x daily - 7 x weekly - 2 sets - 10 reps  Date: 12/13/2021 - Sit to Stand  - 2 x daily - 7 x weekly - 2 sets - 10 reps - Standing Hip Abduction with Counter Support  - 2 x daily - 7 x weekly - 2 sets - 10 reps - Correct Seated Posture  - 5 x daily - 7 x weekly - 1 sets - 2 reps - Seated Scapular Retraction  - 1 x daily - 7 x weekly - 3 sets - 10 reps - 5" hold - Supine Chin Tuck  - 1 x daily - 7 x weekly - 2 sets - 10 reps - 5" hold - Supine Bridge  - 1 x daily - 7 x weekly - 3 sets - 10 reps - 5" hold  12/15/21: log rolling, LTR, decompression ASSESSMENT:  CLINICAL IMPRESSION: Patient has met 2/2 short term goals and 2/6 long term goals with ability to complete HEP and improvement in symptoms, strength, ROM, activity tolerance, gait, balance, and functional mobility. Remaining goals not met due to continued deficits in symptoms, strength, activity tolerance, gait, and functional mobility but he has made good progress toward remaining goals. Extending POC 2x /week for 4 more weeks  to continue to improve mobility and symptoms. Patient will continue to benefit from skilled physical therapy in order to improve function and reduce impairment.     OBJECTIVE IMPAIRMENTS: Abnormal gait, decreased activity tolerance, decreased balance, decreased endurance, decreased mobility, difficulty walking, decreased ROM, decreased strength, hypomobility, increased muscle spasms, impaired flexibility, impaired sensation, improper body mechanics, postural dysfunction, and pain.   ACTIVITY LIMITATIONS: carrying, lifting, bending, sitting, standing, squatting, sleeping, stairs, transfers, reach over head, hygiene/grooming, locomotion level, and caring for others  PARTICIPATION LIMITATIONS: meal prep, cleaning, laundry, shopping, community activity, and yard work  PERSONAL FACTORS: Fitness, Time since onset of injury/illness/exacerbation, and 3+ comorbidities: DM, chronic pain, hx foot ulcer/amputation, sedentary, tobacco use  are also affecting patient's functional outcome.   REHAB POTENTIAL: Good  CLINICAL DECISION MAKING: Stable/uncomplicated  EVALUATION COMPLEXITY: Low    GOALS: Goals reviewed with patient? Yes  SHORT TERM GOALS: Target date: 12/18/2021  Patient will be independent with HEP in order to improve functional outcomes. Baseline:  Goal status: MET  2.  Patient will report at least 25% improvement in symptoms for improved quality of life. Baseline: Goal status: MET    LONG TERM GOALS: Target date: 12/25/2021  Patient will report at least 75% improvement in symptoms for improved quality of life. Baseline:  Goal status: IN PROGRESS  2.  Patient will improve FOTO score by at least 15 points in order to indicate improved tolerance to activity. Baseline: 40% function 12/26/21 63 % function Goal status: MET  3.  Patient will demonstrate at least 25% improvement in lumbar ROM in all restricted planes for improved ability to move trunk while completing  chores. Baseline: see above Goal status: MET  4.  Patient will be able to ambulate at least 425 feet in 2MWT in order to demonstrate improved tolerance to activity. Baseline: 375 feet with SPC 12/26/21: 405 feet with SPC Goal status: IN PROGRESS  5.  Patient will be able to complete 5x STS in under 14 seconds in order to reduce the risk of falls. Baseline: 22.61 seconds without UE use  12/26/21 14.09 seconds without UE use Goal status: IN PROGRESS  6.  Patient will demonstrate grade of 5/5 MMT  grade in all tested musculature as evidence of improved strength to assist with stair ambulation and gait.   Baseline: see above Goal status: IN PROGRESS   PLAN:  PT FREQUENCY: 2x/week  PT DURATION: 4 weeks  PLANNED INTERVENTIONS: Therapeutic exercises, Therapeutic activity, Neuromuscular re-education, Balance training, Gait training, Patient/Family education, Joint manipulation, Joint mobilization, Stair training, Orthotic/Fit training, DME instructions, Aquatic Therapy, Dry Needling, Electrical stimulation, Spinal manipulation, Spinal mobilization, Cryotherapy, Moist heat, Compression bandaging, scar mobilization, Splintting, Taping, Traction, Ultrasound, Ionotophoresis 94m/ml Dexamethasone, and Manual therapy   PLAN FOR NEXT SESSION: f/u with HEP, postural strength, core and glute strength.  Add wall arch and progress core strength.     AVianne BullsZaunegger, PT 12/26/2021, 2:26 PM

## 2021-12-28 ENCOUNTER — Encounter (HOSPITAL_COMMUNITY): Payer: Self-pay

## 2021-12-28 ENCOUNTER — Ambulatory Visit (HOSPITAL_COMMUNITY): Payer: Medicare HMO

## 2021-12-28 DIAGNOSIS — R293 Abnormal posture: Secondary | ICD-10-CM

## 2021-12-28 DIAGNOSIS — M6281 Muscle weakness (generalized): Secondary | ICD-10-CM | POA: Diagnosis not present

## 2021-12-28 DIAGNOSIS — M5459 Other low back pain: Secondary | ICD-10-CM | POA: Diagnosis not present

## 2021-12-28 DIAGNOSIS — R2689 Other abnormalities of gait and mobility: Secondary | ICD-10-CM

## 2021-12-28 DIAGNOSIS — R29898 Other symptoms and signs involving the musculoskeletal system: Secondary | ICD-10-CM

## 2021-12-28 NOTE — Therapy (Addendum)
OUTPATIENT PHYSICAL THERAPY THORACOLUMBAR TREATMENT   Patient Name: Mitchell [name] MRN: [id number] DOB:1950-08-02, 71 y.o., male Today's Date: 12/28/2021    12/28/21 1530  PT Visits / Re-Eval  Visit Number 6  Number of Visits 16  Date for PT Re-Evaluation 01/23/22  Authorization  Authorization Type [employer]  (no vl, no auth)  PT Time Calculation  PT Start Time 1516  PT Stop Time 1558  PT Time Calculation (min) 42 min  PT - End of Session  Activity Tolerance Patient tolerated treatment well;No increased pain  Behavior During Therapy [facility] for tasks assessed/performed       Past Medical History:  Diagnosis Date   Arthritis    Cellulitis of right lower extremity 05/09/2014   Diabetes mellitus without complication (HCC)    Diabetic foot ulcer ([place]) 07/07/2014   Status post bedside debridement of multiple right plantar ulcers by podiatrist, Dr. Laurena Spies.   Hypercholesterolemia    IDA (iron deficiency anemia)    Maggot infestation    right foot ulcer   Obesity 05/09/2014   Tobacco abuse 07/07/2014   Past Surgical History:  Procedure Laterality Date   APPENDECTOMY     APPLICATION OF WOUND VAC Right 10/27/2020   Procedure: APPLICATION OF WOUND VAC;  Surgeon: Serafina Mitchell, MD;  Location: [facility] OR;  Service: Vascular;  Laterality: Right;   ESOPHAGOGASTRODUODENOSCOPY (EGD) WITH PROPOFOL N/A 10/28/2020   Procedure: ESOPHAGOGASTRODUODENOSCOPY (EGD) WITH PROPOFOL;  Surgeon: Sharyn Creamer, MD;  Location: [place];  Service: Gastroenterology;  Laterality: N/A;   HEMOSTASIS CLIP PLACEMENT  10/28/2020   Procedure: HEMOSTASIS CLIP PLACEMENT;  Surgeon: Sharyn Creamer, MD;  Location: [facility] ENDOSCOPY;  Service: Gastroenterology;;   HOT HEMOSTASIS N/A 10/28/2020   Procedure: HOT HEMOSTASIS (ARGON PLASMA COAGULATION/BICAP);  Surgeon: Sharyn Creamer, MD;  Location: [place];  Service: Gastroenterology;  Laterality: N/A;   PERIPHERAL VASCULAR CATHETERIZATION N/A 07/22/2015   Procedure:  Abdominal Aortogram w/Lower Extremity;  Surgeon: Elam Dutch, MD;  Location: [place] CV LAB;  Service: Cardiovascular;  Laterality: N/A;   TRANSMETATARSAL AMPUTATION Right 10/27/2020   Procedure: TRANSMETATARSAL AMPUTATION;  Surgeon: Serafina Mitchell, MD;  Location: [facility] OR;  Service: Vascular;  Laterality: Right;   Patient Active Problem List   Diagnosis Date Noted   Foot osteomyelitis, right ([place]) 10/25/2020   GI bleed 10/25/2020   Blood loss anemia 10/25/2020   Osteomyelitis of right foot ([place]) 10/25/2020   Closed fracture of proximal end of right humerus with routine healing 02/25/2019   Pre-ulcerative calluses 04/20/2018   Myositis 07/08/2014   Diabetic foot ulcer associated with type 2 diabetes mellitus ([place]) 07/07/2014   Tobacco abuse 07/07/2014   Diabetic ulcer of right foot ([place]) 05/12/2014   Pulse weakness    Peripheral neuropathy    Cellulitis of right lower extremity 05/09/2014   Obesity 05/09/2014   Cellulitis of right lower leg 05/09/2014   Hyperglycemia 03/21/2013   Type 2 diabetes mellitus with foot ulcer ([place]) 03/21/2013   Noncompliance with medications due to cost issues 03/21/2013    PCP: Monico Blitz, MD  REFERRING PROVIDER: Monico Blitz, MD  REFERRING DIAG: LBP  Rationale for Evaluation and Treatment: Rehabilitation  THERAPY DIAG:  No diagnosis found.  ONSET DATE: 1 year  SUBJECTIVE:  SUBJECTIVE STATEMENT: Pt stated his jeans are a little tight, some discomfort there but no reports of pain right now.  Had some soreness following last apt.  Reports he has apt for CT tomorrow to check on the flem in throat he's had for almost a year.  Eval subjective: Patient states back has been hurting since he was in the hospital for his foot. His back bothers him with bending,  chores, getting out of bed and more. Back is rough with trying to do stuff.   PERTINENT HISTORY:  DM, chronic pain, hx foot ulcer/amputation  PAIN:  Are you having pain? Yes: NPRS scale: 4-5/10 Pain location: low back and BLE Pain description: achey in back and tight BLE Aggravating factors: chores, bending, movement  Relieving factors: none  PRECAUTIONS: None  WEIGHT BEARING RESTRICTIONS: No  FALLS:  Has patient fallen in last 6 months? No  LIVING ENVIRONMENT: Lives with: lives alone Lives in: House/apartment Stairs:  ramp Has following equipment at home: Single point cane  OCCUPATION: Retired  PLOF: Independent  PATIENT GOALS: decrease back pain  NEXT MD VISIT: not sure  OBJECTIVE:   PATIENT SURVEYS:  FOTO 40% function  12/26/21: 63% function  SCREENING FOR RED FLAGS: Bowel or bladder incontinence: No Spinal tumors: No Cauda equina syndrome: No Compression fracture: No Abdominal aneurysm: No  COGNITION: Overall cognitive status: Within functional limits for tasks assessed     SENSATION: WFL   POSTURE: rounded shoulders, forward head, decreased lumbar lordosis, and increased thoracic kyphosis  PALPATION: TTP lumbar paraspinals   LUMBAR ROM:   AROM eval 12/26/21  Flexion 0% limited 0% limited  Extension 90% limited 75% limited  Right lateral flexion 50% limited 25% limited  Left lateral flexion 50% limited 25% limited  Right rotation 50% limited 25% limited  Left rotation 50% limited 25% limited   (Blank rows = not tested)  LOWER EXTREMITY ROM:   WFL for tasks assessed  Active  Right eval Left eval  Hip flexion    Hip extension    Hip abduction    Hip adduction    Hip internal rotation    Hip external rotation    Knee flexion    Knee extension    Ankle dorsiflexion    Ankle plantarflexion    Ankle inversion    Ankle eversion     (Blank rows = not tested)  LOWER EXTREMITY MMT:    MMT Right eval Left eval Right 12/26/21  Left 12/26/21  Hip flexion 4+ 4+ 5 5  Hip extension 3+ 4- 4+ 4+  Hip abduction 3+ 4- 4+ 5  Hip adduction      Hip internal rotation      Hip external rotation      Knee flexion 4+ _0 Knee extension 4+ _1 Ankle dorsiflexion      Ankle plantarflexion      Ankle inversion      Ankle eversion       (Blank rows = not tested)    FUNCTIONAL TESTS:  5 times sit to stand: 22.61 seconds without UE use 2 minute walk test: 375 feet with SPC Transfer STS: labored, requires bilateral UE support  GAIT: Distance walked: 375 feet Assistive device utilized: Single point cane Level of assistance: Modified independence Comments: 2MWT, gradually increasing trunk flexion, decreased L arm swing, Trendelenburg on L   Reassessment 12/26/21 FUNCTIONAL TESTS:  5 times sit to stand: 14.09 seconds without UE use 2 minute walk test:  405 feet with SPC Transfer STS: labored, requires bilateral UE support  GAIT: Distance walked: 405 feet Assistive device utilized: Single point cane Level of assistance: Modified independence Comments: 2MWT, gradually increasing trunk flexion, decreased foot clearance bilaterally   TODAY'S TREATMENT:                                                                                                                              DATE:  12/28/21: Standing: wall arch Minisquat front of chair, cueing for mechanics STS 10x Sidestep down hallway 2RT with HHA, 2RT no HHA Back against wall UE flexion 10x Seated Wback10  X to V Supine: head elevated Chin tucks 10 Bridge 10x 5"  12/26/21 Reassessment   12/18/21 LTR 5 x 10 second holds Bridge 10 x 5 second holds DKTC with green ball 1x 10 5 second holds Sidelying hip abduction 2x 10 bilateral  Supine active hamstring stretch with towel at knee 5 x 10 second holds bilateral  X to V  2x 10  STS 2 x 10   12/15/21 Log rolling  Supine: LTR 5x 10" Decompression 2-5 5x 5" Bridge 10x5" SKTC 2x  30"  Sidelying: Clam with GTB 10x 5"  12/13/21 STS 10x   Standing: Hip abduction 10x 3"  Seated posture education Rows RTB 2x 10  Supine:  Chin tuck 10 x3" Abdominal sets 10x 5" Bridge 10x   11/27/21  STS 2x 10  Standing hip abduction 2 x 10 bilateral    PATIENT EDUCATION:  Education details: 12/18/21: HEP;  EVAL: Patient educated on exam findings, POC, scope of PT, HEP, and posture. Person educated: Patient Education method: Explanation, Demonstration, and Handouts Education comprehension: verbalized understanding, returned demonstration, verbal cues required, and tactile cues required  HOME EXERCISE PROGRAM: Access Code: [email or web address]  Date: 11/27/2021 - Sit to Stand  - 2 x daily - 7 x weekly - 2 sets - 10 reps - Standing Hip Abduction with Counter Support  - 2 x daily - 7 x weekly - 2 sets - 10 reps  Date: 12/13/2021 - Sit to Stand  - 2 x daily - 7 x weekly - 2 sets - 10 reps - Standing Hip Abduction with Counter Support  - 2 x daily - 7 x weekly - 2 sets - 10 reps - Correct Seated Posture  - 5 x daily - 7 x weekly - 1 sets - 2 reps - Seated Scapular Retraction  - 1 x daily - 7 x weekly - 3 sets - 10 reps - 5" hold - Supine Chin Tuck  - 1 x daily - 7 x weekly - 2 sets - 10 reps - 5" hold - Supine Bridge  - 1 x daily - 7 x weekly - 3 sets - 10 reps - 5" hold  12/15/21: log rolling, LTR, decompression   ASSESSMENT:  CLINICAL IMPRESSION: Session focus with posture, core and proximal strengthening.  Pt stated he was surprised at increased difficulty  with balance last session, added side step for glut med strengthening and to improve gait with SLS with SBA.  Added seated and standing postural strengthening with cueing to improve awareness of posture upon rest breaks.  Pt tolerated well to session, no reports of pain through session.    OBJECTIVE IMPAIRMENTS: Abnormal gait, decreased activity tolerance, decreased balance, decreased endurance, decreased mobility,  difficulty walking, decreased ROM, decreased strength, hypomobility, increased muscle spasms, impaired flexibility, impaired sensation, improper body mechanics, postural dysfunction, and pain.   ACTIVITY LIMITATIONS: carrying, lifting, bending, sitting, standing, squatting, sleeping, stairs, transfers, reach over head, hygiene/grooming, locomotion level, and caring for others  PARTICIPATION LIMITATIONS: meal prep, cleaning, laundry, shopping, community activity, and yard work  PERSONAL FACTORS: Fitness, Time since onset of injury/illness/exacerbation, and 3+ comorbidities: DM, chronic pain, hx foot ulcer/amputation, sedentary, tobacco use  are also affecting patient's functional outcome.   REHAB POTENTIAL: Good  CLINICAL DECISION MAKING: Stable/uncomplicated  EVALUATION COMPLEXITY: Low    GOALS: Goals reviewed with patient? Yes  SHORT TERM GOALS: Target date: 12/18/2021  Patient will be independent with HEP in order to improve functional outcomes. Baseline:  Goal status: MET  2.  Patient will report at least 25% improvement in symptoms for improved quality of life. Baseline: Goal status: MET    LONG TERM GOALS: Target date: 12/25/2021  Patient will report at least 75% improvement in symptoms for improved quality of life. Baseline:  Goal status: IN PROGRESS  2.  Patient will improve FOTO score by at least 15 points in order to indicate improved tolerance to activity. Baseline: 40% function 12/26/21 63 % function Goal status: MET  3.  Patient will demonstrate at least 25% improvement in lumbar ROM in all restricted planes for improved ability to move trunk while completing chores. Baseline: see above Goal status: MET  4.  Patient will be able to ambulate at least 425 feet in 2MWT in order to demonstrate improved tolerance to activity. Baseline: 375 feet with SPC 12/26/21: 405 feet with SPC Goal status: IN PROGRESS  5.  Patient will be able to complete 5x STS in under 14  seconds in order to reduce the risk of falls. Baseline: 22.61 seconds without UE use  12/26/21 14.09 seconds without UE use Goal status: IN PROGRESS  6.  Patient will demonstrate grade of 5/5 MMT grade in all tested musculature as evidence of improved strength to assist with stair ambulation and gait.   Baseline: see above Goal status: IN PROGRESS   PLAN:  PT FREQUENCY: 2x/week  PT DURATION: 4 weeks  PLANNED INTERVENTIONS: Therapeutic exercises, Therapeutic activity, Neuromuscular re-education, Balance training, Gait training, Patient/Family education, Joint manipulation, Joint mobilization, Stair training, Orthotic/Fit training, DME instructions, Aquatic Therapy, Dry Needling, Electrical stimulation, Spinal manipulation, Spinal mobilization, Cryotherapy, Moist heat, Compression bandaging, scar mobilization, Splintting, Taping, Traction, Ultrasound, Ionotophoresis 38m/ml Dexamethasone, and Manual therapy   PLAN FOR NEXT SESSION: f/u with HEP, postural strength, core and glute strength.  Add standing marching, paloff and progress core and posture strengthening.  Progress HEP next session.   CIhor Austin LPTA/CLT; CBIS [phone] CAldona Lento PTA 12/28/2021, 3:15 PM

## 2021-12-30 DIAGNOSIS — E1129 Type 2 diabetes mellitus with other diabetic kidney complication: Secondary | ICD-10-CM | POA: Diagnosis not present

## 2022-01-01 DIAGNOSIS — R69 Illness, unspecified: Secondary | ICD-10-CM | POA: Diagnosis not present

## 2022-01-01 DIAGNOSIS — F17210 Nicotine dependence, cigarettes, uncomplicated: Secondary | ICD-10-CM | POA: Diagnosis not present

## 2022-01-01 DIAGNOSIS — Z122 Encounter for screening for malignant neoplasm of respiratory organs: Secondary | ICD-10-CM | POA: Diagnosis not present

## 2022-01-02 ENCOUNTER — Encounter (HOSPITAL_COMMUNITY): Payer: Medicare HMO | Admitting: Physical Therapy

## 2022-01-02 DIAGNOSIS — Z1211 Encounter for screening for malignant neoplasm of colon: Secondary | ICD-10-CM | POA: Diagnosis not present

## 2022-01-03 ENCOUNTER — Ambulatory Visit (HOSPITAL_COMMUNITY): Payer: Medicare HMO | Admitting: Physical Therapy

## 2022-01-03 ENCOUNTER — Encounter (HOSPITAL_COMMUNITY): Payer: Self-pay | Admitting: Physical Therapy

## 2022-01-03 DIAGNOSIS — R29898 Other symptoms and signs involving the musculoskeletal system: Secondary | ICD-10-CM

## 2022-01-03 DIAGNOSIS — R293 Abnormal posture: Secondary | ICD-10-CM

## 2022-01-03 DIAGNOSIS — M5459 Other low back pain: Secondary | ICD-10-CM | POA: Diagnosis not present

## 2022-01-03 DIAGNOSIS — R2689 Other abnormalities of gait and mobility: Secondary | ICD-10-CM | POA: Diagnosis not present

## 2022-01-03 DIAGNOSIS — M6281 Muscle weakness (generalized): Secondary | ICD-10-CM

## 2022-01-03 NOTE — Therapy (Signed)
OUTPATIENT PHYSICAL THERAPY THORACOLUMBAR TREATMENT   Patient Name: Mitchell [name] MRN: [id number] DOB:Aug 31, 1950, 71 y.o., male Today's Date: 01/03/2022  Progress Note   Reporting Period 11/27/21 to 12/26/21   See note below for Objective Data and Assessment of Progress/Goals    PT End of Session - 01/03/22 1351     Visit Number 7    Number of Visits 16    Date for PT Re-Evaluation 01/23/22    Authorization Type [employer]  (no vl, no auth)    PT Start Time 1351    PT Stop Time 1430    PT Time Calculation (min) 39 min    Activity Tolerance Patient tolerated treatment well;No increased pain    Behavior During Therapy [facility] for tasks assessed/performed                Past Medical History:  Diagnosis Date   Arthritis    Cellulitis of right lower extremity 05/09/2014   Diabetes mellitus without complication (HCC)    Diabetic foot ulcer ([place]) 07/07/2014   Status post bedside debridement of multiple right plantar ulcers by podiatrist, Dr. Laurena Spies.   Hypercholesterolemia    IDA (iron deficiency anemia)    Maggot infestation    right foot ulcer   Obesity 05/09/2014   Tobacco abuse 07/07/2014   Past Surgical History:  Procedure Laterality Date   APPENDECTOMY     APPLICATION OF WOUND VAC Right 10/27/2020   Procedure: APPLICATION OF WOUND VAC;  Surgeon: Serafina Mitchell, MD;  Location: [facility] OR;  Service: Vascular;  Laterality: Right;   ESOPHAGOGASTRODUODENOSCOPY (EGD) WITH PROPOFOL N/A 10/28/2020   Procedure: ESOPHAGOGASTRODUODENOSCOPY (EGD) WITH PROPOFOL;  Surgeon: Sharyn Creamer, MD;  Location: [place];  Service: Gastroenterology;  Laterality: N/A;   HEMOSTASIS CLIP PLACEMENT  10/28/2020   Procedure: HEMOSTASIS CLIP PLACEMENT;  Surgeon: Sharyn Creamer, MD;  Location: [facility] ENDOSCOPY;  Service: Gastroenterology;;   HOT HEMOSTASIS N/A 10/28/2020   Procedure: HOT HEMOSTASIS (ARGON PLASMA COAGULATION/BICAP);  Surgeon: Sharyn Creamer, MD;  Location: [place];  Service:  Gastroenterology;  Laterality: N/A;   PERIPHERAL VASCULAR CATHETERIZATION N/A 07/22/2015   Procedure: Abdominal Aortogram w/Lower Extremity;  Surgeon: Elam Dutch, MD;  Location: [place] CV LAB;  Service: Cardiovascular;  Laterality: N/A;   TRANSMETATARSAL AMPUTATION Right 10/27/2020   Procedure: TRANSMETATARSAL AMPUTATION;  Surgeon: Serafina Mitchell, MD;  Location: [facility] OR;  Service: Vascular;  Laterality: Right;   Patient Active Problem List   Diagnosis Date Noted   Foot osteomyelitis, right ([place]) 10/25/2020   GI bleed 10/25/2020   Blood loss anemia 10/25/2020   Osteomyelitis of right foot ([place]) 10/25/2020   Closed fracture of proximal end of right humerus with routine healing 02/25/2019   Pre-ulcerative calluses 04/20/2018   Myositis 07/08/2014   Diabetic foot ulcer associated with type 2 diabetes mellitus ([place]) 07/07/2014   Tobacco abuse 07/07/2014   Diabetic ulcer of right foot ([place]) 05/12/2014   Pulse weakness    Peripheral neuropathy    Cellulitis of right lower extremity 05/09/2014   Obesity 05/09/2014   Cellulitis of right lower leg 05/09/2014   Hyperglycemia 03/21/2013   Type 2 diabetes mellitus with foot ulcer ([place]) 03/21/2013   Noncompliance with medications due to cost issues 03/21/2013    PCP: Monico Blitz, MD  REFERRING PROVIDER: Monico Blitz, MD  REFERRING DIAG: LBP  Rationale for Evaluation and Treatment: Rehabilitation  THERAPY DIAG:  Other low back pain  Other abnormalities of gait and mobility  Other symptoms and  signs involving the musculoskeletal system  Abnormal posture  Muscle weakness (generalized)  ONSET DATE: 1 year  SUBJECTIVE:                                                                                                                                                                                           SUBJECTIVE STATEMENT: Patient states he has been trying to walk without the can but its hard to get used too. Back has not  been bothering him as much. Back is the best its been in a while.   Eval subjective: Patient states back has been hurting since he was in the hospital for his foot. His back bothers him with bending, chores, getting out of bed and more. Back is rough with trying to do stuff.   PERTINENT HISTORY:  DM, chronic pain, hx foot ulcer/amputation  PAIN:  Are you having pain? Yes: NPRS scale: 2/10 Pain location: low back and BLE Pain description: achey in back and tight BLE Aggravating factors: chores, bending, movement  Relieving factors: none  PRECAUTIONS: None  WEIGHT BEARING RESTRICTIONS: No  FALLS:  Has patient fallen in last 6 months? No  LIVING ENVIRONMENT: Lives with: lives alone Lives in: House/apartment Stairs:  ramp Has following equipment at home: Single point cane  OCCUPATION: Retired  PLOF: Independent  PATIENT GOALS: decrease back pain  NEXT MD VISIT: not sure  OBJECTIVE:   PATIENT SURVEYS:  FOTO 40% function  12/26/21: 63% function  SCREENING FOR RED FLAGS: Bowel or bladder incontinence: No Spinal tumors: No Cauda equina syndrome: No Compression fracture: No Abdominal aneurysm: No  COGNITION: Overall cognitive status: Within functional limits for tasks assessed     SENSATION: WFL   POSTURE: rounded shoulders, forward head, decreased lumbar lordosis, and increased thoracic kyphosis  PALPATION: TTP lumbar paraspinals   LUMBAR ROM:   AROM eval 12/26/21  Flexion 0% limited 0% limited  Extension 90% limited 75% limited  Right lateral flexion 50% limited 25% limited  Left lateral flexion 50% limited 25% limited  Right rotation 50% limited 25% limited  Left rotation 50% limited 25% limited   (Blank rows = not tested)  LOWER EXTREMITY ROM:   WFL for tasks assessed  Active  Right eval Left eval  Hip flexion    Hip extension    Hip abduction    Hip adduction    Hip internal rotation    Hip external rotation    Knee flexion    Knee  extension    Ankle dorsiflexion    Ankle plantarflexion    Ankle inversion    Ankle eversion     (Blank  rows = not tested)  LOWER EXTREMITY MMT:    MMT Right eval Left eval Right 12/26/21 Left 12/26/21  Hip flexion 4+ 4+ 5 5  Hip extension 3+ 4- 4+ 4+  Hip abduction 3+ 4- 4+ 5  Hip adduction      Hip internal rotation      Hip external rotation      Knee flexion 4+ _0 Knee extension 4+ _1 Ankle dorsiflexion      Ankle plantarflexion      Ankle inversion      Ankle eversion       (Blank rows = not tested)    FUNCTIONAL TESTS:  5 times sit to stand: 22.61 seconds without UE use 2 minute walk test: 375 feet with SPC Transfer STS: labored, requires bilateral UE support  GAIT: Distance walked: 375 feet Assistive device utilized: Single point cane Level of assistance: Modified independence Comments: 2MWT, gradually increasing trunk flexion, decreased L arm swing, Trendelenburg on L   Reassessment 12/26/21 FUNCTIONAL TESTS:  5 times sit to stand: 14.09 seconds without UE use 2 minute walk test: 405 feet with SPC Transfer STS: labored, requires bilateral UE support  GAIT: Distance walked: 405 feet Assistive device utilized: Single point cane Level of assistance: Modified independence Comments: 2MWT, gradually increasing trunk flexion, decreased foot clearance bilaterally   TODAY'S TREATMENT:                                                                                                                              DATE:  01/03/22 Seated cervical retraction 2 x 10  Standing hip abduction GTB at knees 2 x 10 bilateral  Row GTB 2 x 10  Shoulder extension GTB 2 x 10  Palof press 2x10 GTB  12/28/21: Standing: wall arch Minisquat front of chair, cueing for mechanics STS 10x Sidestep down hallway 2RT with HHA, 2RT no HHA Back against wall UE flexion 10x Seated Wback10  X to V Supine: head elevated Chin tucks 10 Bridge 10x  5"  12/26/21 Reassessment   12/18/21 LTR 5 x 10 second holds Bridge 10 x 5 second holds DKTC with green ball 1x 10 5 second holds Sidelying hip abduction 2x 10 bilateral  Supine active hamstring stretch with towel at knee 5 x 10 second holds bilateral  X to V  2x 10  STS 2 x 10   12/15/21 Log rolling  Supine: LTR 5x 10" Decompression 2-5 5x 5" Bridge 10x5" SKTC 2x 30"  Sidelying: Clam with GTB 10x 5"  12/13/21 STS 10x   Standing: Hip abduction 10x 3"  Seated posture education Rows RTB 2x 10  Supine:  Chin tuck 10 x3" Abdominal sets 10x 5" Bridge 10x   11/27/21  STS 2x 10  Standing hip abduction 2 x 10 bilateral    PATIENT EDUCATION:  Education details: 12/18/21: HEP;  EVAL: Patient educated on exam findings, POC, scope of PT, HEP,  and posture. Person educated: Patient Education method: Explanation, Demonstration, and Handouts Education comprehension: verbalized understanding, returned demonstration, verbal cues required, and tactile cues required  HOME EXERCISE PROGRAM: Access Code: [email or web address]  Date: 11/27/2021 - Sit to Stand  - 2 x daily - 7 x weekly - 2 sets - 10 reps - Standing Hip Abduction with Counter Support  - 2 x daily - 7 x weekly - 2 sets - 10 reps  Date: 12/13/2021 - Sit to Stand  - 2 x daily - 7 x weekly - 2 sets - 10 reps - Standing Hip Abduction with Counter Support  - 2 x daily - 7 x weekly - 2 sets - 10 reps - Correct Seated Posture  - 5 x daily - 7 x weekly - 1 sets - 2 reps - Seated Scapular Retraction  - 1 x daily - 7 x weekly - 3 sets - 10 reps - 5" hold - Supine Chin Tuck  - 1 x daily - 7 x weekly - 2 sets - 10 reps - 5" hold - Supine Bridge  - 1 x daily - 7 x weekly - 3 sets - 10 reps - 5" hold  12/15/21: log rolling, LTR, decompression  01/03/22 - Standing Shoulder Row with Anchored Resistance  - 1 x daily - 7 x weekly - 2 sets - 10 reps - Shoulder extension with resistance - Neutral  - 1 x daily - 7 x weekly - 2 sets - 10  reps - Standing Anti-Rotation Press with Anchored Resistance  - 1 x daily - 7 x weekly - 2 sets - 10 reps  ASSESSMENT:  CLINICAL IMPRESSION: Continued with postural, core and glute strengthening which is tolerated well. Added standing exercises with resistance with intermittent cueing for posture and mechanics. Patient really enjoyed new postural strengthening exercises and continues to complete requiring cueing for rest breaks.  Patient will continue to benefit from physical therapy in order to improve function and reduce impairment.   OBJECTIVE IMPAIRMENTS: Abnormal gait, decreased activity tolerance, decreased balance, decreased endurance, decreased mobility, difficulty walking, decreased ROM, decreased strength, hypomobility, increased muscle spasms, impaired flexibility, impaired sensation, improper body mechanics, postural dysfunction, and pain.   ACTIVITY LIMITATIONS: carrying, lifting, bending, sitting, standing, squatting, sleeping, stairs, transfers, reach over head, hygiene/grooming, locomotion level, and caring for others  PARTICIPATION LIMITATIONS: meal prep, cleaning, laundry, shopping, community activity, and yard work  PERSONAL FACTORS: Fitness, Time since onset of injury/illness/exacerbation, and 3+ comorbidities: DM, chronic pain, hx foot ulcer/amputation, sedentary, tobacco use  are also affecting patient's functional outcome.   REHAB POTENTIAL: Good  CLINICAL DECISION MAKING: Stable/uncomplicated  EVALUATION COMPLEXITY: Low    GOALS: Goals reviewed with patient? Yes  SHORT TERM GOALS: Target date: 12/18/2021  Patient will be independent with HEP in order to improve functional outcomes. Baseline:  Goal status: MET  2.  Patient will report at least 25% improvement in symptoms for improved quality of life. Baseline: Goal status: MET    LONG TERM GOALS: Target date: 12/25/2021  Patient will report at least 75% improvement in symptoms for improved quality of  life. Baseline:  Goal status: IN PROGRESS  2.  Patient will improve FOTO score by at least 15 points in order to indicate improved tolerance to activity. Baseline: 40% function 12/26/21 63 % function Goal status: MET  3.  Patient will demonstrate at least 25% improvement in lumbar ROM in all restricted planes for improved ability to move trunk while completing chores. Baseline: see above Goal  status: MET  4.  Patient will be able to ambulate at least 425 feet in 2MWT in order to demonstrate improved tolerance to activity. Baseline: 375 feet with SPC 12/26/21: 405 feet with SPC Goal status: IN PROGRESS  5.  Patient will be able to complete 5x STS in under 14 seconds in order to reduce the risk of falls. Baseline: 22.61 seconds without UE use  12/26/21 14.09 seconds without UE use Goal status: IN PROGRESS  6.  Patient will demonstrate grade of 5/5 MMT grade in all tested musculature as evidence of improved strength to assist with stair ambulation and gait.   Baseline: see above Goal status: IN PROGRESS   PLAN:  PT FREQUENCY: 2x/week  PT DURATION: 4 weeks  PLANNED INTERVENTIONS: Therapeutic exercises, Therapeutic activity, Neuromuscular re-education, Balance training, Gait training, Patient/Family education, Joint manipulation, Joint mobilization, Stair training, Orthotic/Fit training, DME instructions, Aquatic Therapy, Dry Needling, Electrical stimulation, Spinal manipulation, Spinal mobilization, Cryotherapy, Moist heat, Compression bandaging, scar mobilization, Splintting, Taping, Traction, Ultrasound, Ionotophoresis 3m/ml Dexamethasone, and Manual therapy   PLAN FOR NEXT SESSION: f/u with HEP, postural strength, core and glute strength.  Add standing marching and progress core and posture strengthening.  Progress HEP next session.     AVianne BullsZaunegger, PT 01/03/2022, 1:51 PM

## 2022-01-04 ENCOUNTER — Encounter (HOSPITAL_COMMUNITY): Payer: Medicare HMO

## 2022-01-09 DIAGNOSIS — E1165 Type 2 diabetes mellitus with hyperglycemia: Secondary | ICD-10-CM | POA: Diagnosis not present

## 2022-01-09 DIAGNOSIS — R69 Illness, unspecified: Secondary | ICD-10-CM | POA: Diagnosis not present

## 2022-01-09 DIAGNOSIS — I700 Atherosclerosis of aorta: Secondary | ICD-10-CM | POA: Diagnosis not present

## 2022-01-09 DIAGNOSIS — I10 Essential (primary) hypertension: Secondary | ICD-10-CM | POA: Diagnosis not present

## 2022-01-09 DIAGNOSIS — J398 Other specified diseases of upper respiratory tract: Secondary | ICD-10-CM | POA: Diagnosis not present

## 2022-01-09 DIAGNOSIS — Z299 Encounter for prophylactic measures, unspecified: Secondary | ICD-10-CM | POA: Diagnosis not present

## 2022-01-09 DIAGNOSIS — F17210 Nicotine dependence, cigarettes, uncomplicated: Secondary | ICD-10-CM | POA: Diagnosis not present

## 2022-01-09 DIAGNOSIS — Z6841 Body Mass Index (BMI) 40.0 and over, adult: Secondary | ICD-10-CM | POA: Diagnosis not present

## 2022-01-17 ENCOUNTER — Ambulatory Visit (HOSPITAL_COMMUNITY): Payer: No Typology Code available for payment source | Attending: Internal Medicine

## 2022-01-17 ENCOUNTER — Encounter (HOSPITAL_COMMUNITY): Payer: Self-pay

## 2022-01-17 DIAGNOSIS — M6281 Muscle weakness (generalized): Secondary | ICD-10-CM | POA: Diagnosis not present

## 2022-01-17 DIAGNOSIS — R293 Abnormal posture: Secondary | ICD-10-CM | POA: Diagnosis not present

## 2022-01-17 DIAGNOSIS — M5459 Other low back pain: Secondary | ICD-10-CM

## 2022-01-17 DIAGNOSIS — R29898 Other symptoms and signs involving the musculoskeletal system: Secondary | ICD-10-CM | POA: Diagnosis not present

## 2022-01-17 DIAGNOSIS — R2689 Other abnormalities of gait and mobility: Secondary | ICD-10-CM | POA: Diagnosis not present

## 2022-01-17 NOTE — Therapy (Signed)
OUTPATIENT PHYSICAL THERAPY THORACOLUMBAR TREATMENT   Patient Name: Mitchell [name] MRN: [id number] DOB:02-07-1950, 72 y.o., male Today's Date: 01/17/2022  PT End of Session - 01/17/22 1320     Visit Number 8    Number of Visits 16    Date for PT Re-Evaluation 01/23/22    Authorization Type [employer]  (no vl, no auth)    PT Start Time 1300    PT Stop Time 1345    PT Time Calculation (min) 45 min    Activity Tolerance Patient tolerated treatment well;No increased pain    Behavior During Therapy [facility] for tasks assessed/performed                 Past Medical History:  Diagnosis Date   Arthritis    Cellulitis of right lower extremity 05/09/2014   Diabetes mellitus without complication (HCC)    Diabetic foot ulcer ([place]) 07/07/2014   Status post bedside debridement of multiple right plantar ulcers by podiatrist, Dr. Laurena Spies.   Hypercholesterolemia    IDA (iron deficiency anemia)    Maggot infestation    right foot ulcer   Obesity 05/09/2014   Tobacco abuse 07/07/2014   Past Surgical History:  Procedure Laterality Date   APPENDECTOMY     APPLICATION OF WOUND VAC Right 10/27/2020   Procedure: APPLICATION OF WOUND VAC;  Surgeon: Serafina Mitchell, MD;  Location: [facility] OR;  Service: Vascular;  Laterality: Right;   ESOPHAGOGASTRODUODENOSCOPY (EGD) WITH PROPOFOL N/A 10/28/2020   Procedure: ESOPHAGOGASTRODUODENOSCOPY (EGD) WITH PROPOFOL;  Surgeon: Sharyn Creamer, MD;  Location: [place];  Service: Gastroenterology;  Laterality: N/A;   HEMOSTASIS CLIP PLACEMENT  10/28/2020   Procedure: HEMOSTASIS CLIP PLACEMENT;  Surgeon: Sharyn Creamer, MD;  Location: [facility] ENDOSCOPY;  Service: Gastroenterology;;   HOT HEMOSTASIS N/A 10/28/2020   Procedure: HOT HEMOSTASIS (ARGON PLASMA COAGULATION/BICAP);  Surgeon: Sharyn Creamer, MD;  Location: [place];  Service: Gastroenterology;  Laterality: N/A;   PERIPHERAL VASCULAR CATHETERIZATION N/A 07/22/2015   Procedure: Abdominal Aortogram w/Lower  Extremity;  Surgeon: Elam Dutch, MD;  Location: [place] CV LAB;  Service: Cardiovascular;  Laterality: N/A;   TRANSMETATARSAL AMPUTATION Right 10/27/2020   Procedure: TRANSMETATARSAL AMPUTATION;  Surgeon: Serafina Mitchell, MD;  Location: [facility] OR;  Service: Vascular;  Laterality: Right;   Patient Active Problem List   Diagnosis Date Noted   Foot osteomyelitis, right ([place]) 10/25/2020   GI bleed 10/25/2020   Blood loss anemia 10/25/2020   Osteomyelitis of right foot ([place]) 10/25/2020   Closed fracture of proximal end of right humerus with routine healing 02/25/2019   Pre-ulcerative calluses 04/20/2018   Myositis 07/08/2014   Diabetic foot ulcer associated with type 2 diabetes mellitus ([place]) 07/07/2014   Tobacco abuse 07/07/2014   Diabetic ulcer of right foot ([place]) 05/12/2014   Pulse weakness    Peripheral neuropathy    Cellulitis of right lower extremity 05/09/2014   Obesity 05/09/2014   Cellulitis of right lower leg 05/09/2014   Hyperglycemia 03/21/2013   Type 2 diabetes mellitus with foot ulcer ([place]) 03/21/2013   Noncompliance with medications due to cost issues 03/21/2013    PCP: Monico Blitz, MD  REFERRING PROVIDER: Monico Blitz, MD  REFERRING DIAG: LBP  Rationale for Evaluation and Treatment: Rehabilitation  THERAPY DIAG:  Other low back pain  Other abnormalities of gait and mobility  Other symptoms and signs involving the musculoskeletal system  Abnormal posture  Muscle weakness (generalized)  ONSET DATE: 1 year  SUBJECTIVE:  SUBJECTIVE STATEMENT: Pt stated he was shopping at [facility] earlier and reports his sugar dropped and feels wobbly, arrived with [facility] today.    Eval subjective: Patient states back has been hurting since he was in the hospital for his foot. His back  bothers him with bending, chores, getting out of bed and more. Back is rough with trying to do stuff.   PERTINENT HISTORY:  DM, chronic pain, hx foot ulcer/amputation  PAIN:  Are you having pain? Yes: NPRS scale: 6/10 Pain location: low back and BLE Pain description: achey in back and tight BLE Aggravating factors: chores, bending, movement  Relieving factors: none  PRECAUTIONS: None  WEIGHT BEARING RESTRICTIONS: No  FALLS:  Has patient fallen in last 6 months? No  LIVING ENVIRONMENT: Lives with: lives alone Lives in: House/apartment Stairs:  ramp Has following equipment at home: Single point cane  OCCUPATION: Retired  PLOF: Independent  PATIENT GOALS: decrease back pain  NEXT MD VISIT: not sure  OBJECTIVE:   PATIENT SURVEYS:  FOTO 40% function  12/26/21: 63% function  SCREENING FOR RED FLAGS: Bowel or bladder incontinence: No Spinal tumors: No Cauda equina syndrome: No Compression fracture: No Abdominal aneurysm: No  COGNITION: Overall cognitive status: Within functional limits for tasks assessed     SENSATION: WFL   POSTURE: rounded shoulders, forward head, decreased lumbar lordosis, and increased thoracic kyphosis  PALPATION: TTP lumbar paraspinals   LUMBAR ROM:   AROM eval 12/26/21  Flexion 0% limited 0% limited  Extension 90% limited 75% limited  Right lateral flexion 50% limited 25% limited  Left lateral flexion 50% limited 25% limited  Right rotation 50% limited 25% limited  Left rotation 50% limited 25% limited   (Blank rows = not tested)  LOWER EXTREMITY ROM:   WFL for tasks assessed  Active  Right eval Left eval  Hip flexion    Hip extension    Hip abduction    Hip adduction    Hip internal rotation    Hip external rotation    Knee flexion    Knee extension    Ankle dorsiflexion    Ankle plantarflexion    Ankle inversion    Ankle eversion     (Blank rows = not tested)  LOWER EXTREMITY MMT:    MMT Right eval Left eval  Right 12/26/21 Left 12/26/21  Hip flexion 4+ 4+ 5 5  Hip extension 3+ 4- 4+ 4+  Hip abduction 3+ 4- 4+ 5  Hip adduction      Hip internal rotation      Hip external rotation      Knee flexion 4+ _0 Knee extension 4+ _1 Ankle dorsiflexion      Ankle plantarflexion      Ankle inversion      Ankle eversion       (Blank rows = not tested)    FUNCTIONAL TESTS:  5 times sit to stand: 22.61 seconds without UE use 2 minute walk test: 375 feet with SPC Transfer STS: labored, requires bilateral UE support  GAIT: Distance walked: 375 feet Assistive device utilized: Single point cane Level of assistance: Modified independence Comments: 2MWT, gradually increasing trunk flexion, decreased L arm swing, Trendelenburg on L   Reassessment 12/26/21 FUNCTIONAL TESTS:  5 times sit to stand: 14.09 seconds without UE use 2 minute walk test: 405 feet with SPC Transfer STS: labored, requires bilateral UE support  GAIT: Distance walked: 405 feet Assistive device utilized: Single point cane Level  of assistance: Modified independence Comments: 2MWT, gradually increasing trunk flexion, decreased foot clearance bilaterally   TODAY'S TREATMENT:                                                                                                                              DATE:  01/17/22 Alternating march intermittent 1 UE 10x 3" BTB shoulder extension 15x 5" BTB row 15x 5" BTB palloff 10x 2 sets Minisquats  10x STS eccentric control  03/06/21 Seated cervical retraction 2 x 10  Standing hip abduction GTB at knees 2 x 10 bilateral  Row GTB 2 x 10  Shoulder extension GTB 2 x 10  Palof press 2x10 GTB  12/28/21: Standing: wall arch Minisquat front of chair, cueing for mechanics STS 10x Sidestep down hallway 2RT with HHA, 2RT no HHA Back against wall UE flexion 10x Seated Wback10  X to V Supine: head elevated Chin tucks 10 Bridge 10x 5"  12/26/21 Reassessment   12/18/21 LTR 5 x  10 second holds Bridge 10 x 5 second holds DKTC with green ball 1x 10 5 second holds Sidelying hip abduction 2x 10 bilateral  Supine active hamstring stretch with towel at knee 5 x 10 second holds bilateral  X to V  2x 10  STS 2 x 10   12/15/21 Log rolling  Supine: LTR 5x 10" Decompression 2-5 5x 5" Bridge 10x5" SKTC 2x 30"  Sidelying: Clam with GTB 10x 5"  12/13/21 STS 10x   Standing: Hip abduction 10x 3"  Seated posture education Rows RTB 2x 10  Supine:  Chin tuck 10 x3" Abdominal sets 10x 5" Bridge 10x   11/27/21  STS 2x 10  Standing hip abduction 2 x 10 bilateral    PATIENT EDUCATION:  Education details: 12/18/21: HEP;  EVAL: Patient educated on exam findings, POC, scope of PT, HEP, and posture. Person educated: Patient Education method: Explanation, Demonstration, and Handouts Education comprehension: verbalized understanding, returned demonstration, verbal cues required, and tactile cues required  HOME EXERCISE PROGRAM: Access Code: [email or web address]  Date: 11/27/2021 - Sit to Stand  - 2 x daily - 7 x weekly - 2 sets - 10 reps - Standing Hip Abduction with Counter Support  - 2 x daily - 7 x weekly - 2 sets - 10 reps  Date: 12/13/2021 - Sit to Stand  - 2 x daily - 7 x weekly - 2 sets - 10 reps - Standing Hip Abduction with Counter Support  - 2 x daily - 7 x weekly - 2 sets - 10 reps - Correct Seated Posture  - 5 x daily - 7 x weekly - 1 sets - 2 reps - Seated Scapular Retraction  - 1 x daily - 7 x weekly - 3 sets - 10 reps - 5" hold - Supine Chin Tuck  - 1 x daily - 7 x weekly - 2 sets - 10 reps - 5" hold - Supine Bridge  - 1 x daily -  7 x weekly - 3 sets - 10 reps - 5" hold  12/15/21: log rolling, LTR, decompression  01/03/22 - Standing Shoulder Row with Anchored Resistance  - 1 x daily - 7 x weekly - 2 sets - 10 reps - Shoulder extension with resistance - Neutral  - 1 x daily - 7 x weekly - 2 sets - 10 reps - Standing Anti-Rotation Press with Anchored  Resistance  - 1 x daily - 7 x weekly - 2 sets - 10 reps  ASSESSMENT:  CLINICAL IMPRESSION:  Session focus with postural, core and gluteal strengthening.  Pt required cueing through session to improve awareness of posture and mechanics.  Increased difficulty with marching without HHA, improved posture during exercise with 1 UE support due to impaired balance.  No reports of increased pain through session.  OBJECTIVE IMPAIRMENTS: Abnormal gait, decreased activity tolerance, decreased balance, decreased endurance, decreased mobility, difficulty walking, decreased ROM, decreased strength, hypomobility, increased muscle spasms, impaired flexibility, impaired sensation, improper body mechanics, postural dysfunction, and pain.   ACTIVITY LIMITATIONS: carrying, lifting, bending, sitting, standing, squatting, sleeping, stairs, transfers, reach over head, hygiene/grooming, locomotion level, and caring for others  PARTICIPATION LIMITATIONS: meal prep, cleaning, laundry, shopping, community activity, and yard work  PERSONAL FACTORS: Fitness, Time since onset of injury/illness/exacerbation, and 3+ comorbidities: DM, chronic pain, hx foot ulcer/amputation, sedentary, tobacco use  are also affecting patient's functional outcome.   REHAB POTENTIAL: Good  CLINICAL DECISION MAKING: Stable/uncomplicated  EVALUATION COMPLEXITY: Low    GOALS: Goals reviewed with patient? Yes  SHORT TERM GOALS: Target date: 12/18/2021  Patient will be independent with HEP in order to improve functional outcomes. Baseline:  Goal status: MET  2.  Patient will report at least 25% improvement in symptoms for improved quality of life. Baseline: Goal status: MET    LONG TERM GOALS: Target date: 12/25/2021  Patient will report at least 75% improvement in symptoms for improved quality of life. Baseline:  Goal status: IN PROGRESS  2.  Patient will improve FOTO score by at least 15 points in order to indicate improved  tolerance to activity. Baseline: 40% function 12/26/21 63 % function Goal status: MET  3.  Patient will demonstrate at least 25% improvement in lumbar ROM in all restricted planes for improved ability to move trunk while completing chores. Baseline: see above Goal status: MET  4.  Patient will be able to ambulate at least 425 feet in 2MWT in order to demonstrate improved tolerance to activity. Baseline: 375 feet with SPC 12/26/21: 405 feet with SPC Goal status: IN PROGRESS  5.  Patient will be able to complete 5x STS in under 14 seconds in order to reduce the risk of falls. Baseline: 22.61 seconds without UE use  12/26/21 14.09 seconds without UE use Goal status: IN PROGRESS  6.  Patient will demonstrate grade of 5/5 MMT grade in all tested musculature as evidence of improved strength to assist with stair ambulation and gait.   Baseline: see above Goal status: IN PROGRESS   PLAN:  PT FREQUENCY: 2x/week  PT DURATION: 4 weeks  PLANNED INTERVENTIONS: Therapeutic exercises, Therapeutic activity, Neuromuscular re-education, Balance training, Gait training, Patient/Family education, Joint manipulation, Joint mobilization, Stair training, Orthotic/Fit training, DME instructions, Aquatic Therapy, Dry Needling, Electrical stimulation, Spinal manipulation, Spinal mobilization, Cryotherapy, Moist heat, Compression bandaging, scar mobilization, Splintting, Taping, Traction, Ultrasound, Ionotophoresis 41m/ml Dexamethasone, and Manual therapy   PLAN FOR NEXT SESSION: f/u with HEP, postural strength, core and glute strength.  Add ER with theraband  and standing balance activities, progress core and posture strengthening.     Ihor Austin, LPTA/CLT; CBIS [phone]  Aldona Lento, PTA 01/17/2022, 2:08 PM

## 2022-01-19 ENCOUNTER — Ambulatory Visit (HOSPITAL_COMMUNITY): Payer: No Typology Code available for payment source

## 2022-01-19 DIAGNOSIS — M5459 Other low back pain: Secondary | ICD-10-CM

## 2022-01-19 DIAGNOSIS — R29898 Other symptoms and signs involving the musculoskeletal system: Secondary | ICD-10-CM

## 2022-01-19 DIAGNOSIS — R2689 Other abnormalities of gait and mobility: Secondary | ICD-10-CM

## 2022-01-19 DIAGNOSIS — R293 Abnormal posture: Secondary | ICD-10-CM

## 2022-01-19 NOTE — Therapy (Signed)
OUTPATIENT PHYSICAL THERAPY THORACOLUMBAR TREATMENT   Patient Name: Mitchell [name] MRN: [id number] DOB:1950-01-25, 72 y.o., male Today's Date: 01/19/2022  PT End of Session - 01/19/22 1305     Visit Number 9    Number of Visits 16    Date for PT Re-Evaluation 01/23/22    Authorization Type [employer]  (no vl, no auth)    PT Start Time 1306    PT Stop Time 1347    PT Time Calculation (min) 41 min    Activity Tolerance Patient tolerated treatment well;No increased pain    Behavior During Therapy [facility] for tasks assessed/performed                 Past Medical History:  Diagnosis Date   Arthritis    Cellulitis of right lower extremity 05/09/2014   Diabetes mellitus without complication (HCC)    Diabetic foot ulcer (HCC) 07/07/2014   Status post bedside debridement of multiple right plantar ulcers by podiatrist, Dr. Reynolds Bowl.   Hypercholesterolemia    IDA (iron deficiency anemia)    Maggot infestation    right foot ulcer   Obesity 05/09/2014   Tobacco abuse 07/07/2014   Past Surgical History:  Procedure Laterality Date   APPENDECTOMY     APPLICATION OF WOUND VAC Right 10/27/2020   Procedure: APPLICATION OF WOUND VAC;  Surgeon: Nada Libman, MD;  Location: [facility] OR;  Service: Vascular;  Laterality: Right;   ESOPHAGOGASTRODUODENOSCOPY (EGD) WITH PROPOFOL N/A 10/28/2020   Procedure: ESOPHAGOGASTRODUODENOSCOPY (EGD) WITH PROPOFOL;  Surgeon: [name], MD;  Location: [facility] ENDOSCOPY;  Service: Gastroenterology;  Laterality: N/A;   HEMOSTASIS CLIP PLACEMENT  10/28/2020   Procedure: HEMOSTASIS CLIP PLACEMENT;  Surgeon: [name], MD;  Location: [facility] ENDOSCOPY;  Service: Gastroenterology;;   HOT HEMOSTASIS N/A 10/28/2020   Procedure: HOT HEMOSTASIS (ARGON PLASMA COAGULATION/BICAP);  Surgeon: [name], MD;  Location: [facility] ENDOSCOPY;  Service: Gastroenterology;  Laterality: N/A;   PERIPHERAL VASCULAR CATHETERIZATION N/A 07/22/2015   Procedure: Abdominal Aortogram w/Lower  Extremity;  Surgeon: Sherren Kerns, MD;  Location: [facility] INVASIVE CV LAB;  Service: Cardiovascular;  Laterality: N/A;   TRANSMETATARSAL AMPUTATION Right 10/27/2020   Procedure: TRANSMETATARSAL AMPUTATION;  Surgeon: Nada Libman, MD;  Location: [facility] OR;  Service: Vascular;  Laterality: Right;   Patient Active Problem List   Diagnosis Date Noted   Foot osteomyelitis, right (HCC) 10/25/2020   GI bleed 10/25/2020   Blood loss anemia 10/25/2020   Osteomyelitis of right foot (HCC) 10/25/2020   Closed fracture of proximal end of right humerus with routine healing 02/25/2019   Pre-ulcerative calluses 04/20/2018   Myositis 07/08/2014   Diabetic foot ulcer associated with type 2 diabetes mellitus (HCC) 07/07/2014   Tobacco abuse 07/07/2014   Diabetic ulcer of right foot (HCC) 05/12/2014   Pulse weakness    Peripheral neuropathy    Cellulitis of right lower extremity 05/09/2014   Obesity 05/09/2014   Cellulitis of right lower leg 05/09/2014   Hyperglycemia 03/21/2013   Type 2 diabetes mellitus with foot ulcer (HCC) 03/21/2013   Noncompliance with medications due to cost issues 03/21/2013    PCP: Kirstie Peri, MD  REFERRING PROVIDER: Kirstie Peri, MD  REFERRING DIAG: LBP  Rationale for Evaluation and Treatment: Rehabilitation  THERAPY DIAG:  Other low back pain  Other abnormalities of gait and mobility  Other symptoms and signs involving the musculoskeletal system  Abnormal posture  ONSET DATE: 1 year  SUBJECTIVE:  SUBJECTIVE STATEMENT: Patient able to go out and service his car this morning; worked on it about 30 to 45 minutes.  He feels he is overall better; at least 50% better    Eval subjective: Patient states back has been hurting since he was in the hospital for his foot. His back  bothers him with bending, chores, getting out of bed and more. Back is rough with trying to do stuff.   PERTINENT HISTORY:  DM, chronic pain, hx foot ulcer/amputation  PAIN:  Are you having pain? Yes: NPRS scale: 2-3/10 Pain location: low back and BLE Pain description: achey in back and tight BLE Aggravating factors: chores, bending, movement  Relieving factors: none  PRECAUTIONS: None  WEIGHT BEARING RESTRICTIONS: No  FALLS:  Has patient fallen in last 6 months? No  LIVING ENVIRONMENT: Lives with: lives alone Lives in: House/apartment Stairs:  ramp Has following equipment at home: Single point cane  OCCUPATION: Retired  PLOF: Independent  PATIENT GOALS: decrease back pain  NEXT MD VISIT: not sure  OBJECTIVE:   PATIENT SURVEYS:  FOTO 40% function  12/26/21: 63% function  SCREENING FOR RED FLAGS: Bowel or bladder incontinence: No Spinal tumors: No Cauda equina syndrome: No Compression fracture: No Abdominal aneurysm: No  COGNITION: Overall cognitive status: Within functional limits for tasks assessed     SENSATION: WFL   POSTURE: rounded shoulders, forward head, decreased lumbar lordosis, and increased thoracic kyphosis  PALPATION: TTP lumbar paraspinals   LUMBAR ROM:   AROM eval 12/26/21  Flexion 0% limited 0% limited  Extension 90% limited 75% limited  Right lateral flexion 50% limited 25% limited  Left lateral flexion 50% limited 25% limited  Right rotation 50% limited 25% limited  Left rotation 50% limited 25% limited   (Blank rows = not tested)  LOWER EXTREMITY ROM:   WFL for tasks assessed  Active  Right eval Left eval  Hip flexion    Hip extension    Hip abduction    Hip adduction    Hip internal rotation    Hip external rotation    Knee flexion    Knee extension    Ankle dorsiflexion    Ankle plantarflexion    Ankle inversion    Ankle eversion     (Blank rows = not tested)  LOWER EXTREMITY MMT:    MMT Right eval  Left eval Right 12/26/21 Left 12/26/21  Hip flexion 4+ 4+ 5 5  Hip extension 3+ 4- 4+ 4+  Hip abduction 3+ 4- 4+ 5  Hip adduction      Hip internal rotation      Hip external rotation      Knee flexion 4+ 5 5 5   Knee extension 4+ 5 5 5   Ankle dorsiflexion      Ankle plantarflexion      Ankle inversion      Ankle eversion       (Blank rows = not tested)    FUNCTIONAL TESTS:  5 times sit to stand: 22.61 seconds without UE use 2 minute walk test: 375 feet with SPC Transfer STS: labored, requires bilateral UE support  GAIT: Distance walked: 375 feet Assistive device utilized: Single point cane Level of assistance: Modified independence Comments: , gradually increasing trunk flexion, decreased L arm swing, Trendelenburg on L   Reassessment 12/26/21 FUNCTIONAL TESTS:  5 times sit to stand: 14.09 seconds without UE use 2 minute walk test: 405 feet with SPC Transfer STS: labored, requires bilateral UE support  GAIT: Distance  walked: 405 feet Assistive device utilized: Single point cane Level of assistance: Modified independence Comments: 2MWT, gradually increasing trunk flexion, decreased foot clearance bilaterally   TODAY'S TREATMENT:                                                                                                                              DATE:  01/19/2022 Seated  Cervical retraction 2 x 10 Shoulder bilateral external rotation with BTB 2 x 10  Sit to stand x 10 no UE assist  Standing: Tandem stance x 20" each Hip vectors 3" hold x 8 each BTB scapular retractions 2 x 10 BTB shoulder extensions 2 x 10    01/17/22 Alternating march intermittent 1 UE 10x 3" BTB shoulder extension 15x 5" BTB row 15x 5" BTB palloff 10x 2 sets Minisquats  10x STS eccentric control  03/06/21 Seated cervical retraction 2 x 10  Standing hip abduction GTB at knees 2 x 10 bilateral  Row GTB 2 x 10  Shoulder extension GTB 2 x 10  Palof press 2x10  GTB  12/28/21: Standing: wall arch Minisquat front of chair, cueing for mechanics STS 10x Sidestep down hallway 2RT with HHA, 2RT no HHA Back against wall UE flexion 10x Seated Wback10  X to V Supine: head elevated Chin tucks 10 Bridge 10x 5"  12/26/21 Reassessment   12/18/21 LTR 5 x 10 second holds Bridge 10 x 5 second holds DKTC with green ball 1x 10 5 second holds Sidelying hip abduction 2x 10 bilateral  Supine active hamstring stretch with towel at knee 5 x 10 second holds bilateral  X to V  2x 10  STS 2 x 10   12/15/21 Log rolling  Supine: LTR 5x 10" Decompression 2-5 5x 5" Bridge 10x5" SKTC 2x 30"  Sidelying: Clam with GTB 10x 5"  12/13/21 STS 10x   Standing: Hip abduction 10x 3"  Seated posture education Rows RTB 2x 10  Supine:  Chin tuck 10 x3" Abdominal sets 10x 5" Bridge 10x   11/27/21  STS 2x 10  Standing hip abduction 2 x 10 bilateral    PATIENT EDUCATION:  Education details: 12/18/21: HEP;  EVAL: Patient educated on exam findings, POC, scope of PT, HEP, and posture. Person educated: Patient Education method: Explanation, Demonstration, and Handouts Education comprehension: verbalized understanding, returned demonstration, verbal cues required, and tactile cues required  HOME EXERCISE PROGRAM: Access Code: [email or web address]  01/19/2022 resisted shoulder external rotation with BTB  Date: 11/27/2021 - Sit to Stand  - 2 x daily - 7 x weekly - 2 sets - 10 reps - Standing Hip Abduction with Counter Support  - 2 x daily - 7 x weekly - 2 sets - 10 reps  Date: 12/13/2021 - Sit to Stand  - 2 x daily - 7 x weekly - 2 sets - 10 reps - Standing Hip Abduction with Counter Support  - 2 x daily - 7 x weekly - 2 sets -  10 reps - Correct Seated Posture  - 5 x daily - 7 x weekly - 1 sets - 2 reps - Seated Scapular Retraction  - 1 x daily - 7 x weekly - 3 sets - 10 reps - 5" hold - Supine Chin Tuck  - 1 x daily - 7 x weekly - 2 sets - 10 reps - 5" hold -  Supine Bridge  - 1 x daily - 7 x weekly - 3 sets - 10 reps - 5" hold  12/15/21: log rolling, LTR, decompression  01/03/22 - Standing Shoulder Row with Anchored Resistance  - 1 x daily - 7 x weekly - 2 sets - 10 reps - Shoulder extension with resistance - Neutral  - 1 x daily - 7 x weekly - 2 sets - 10 reps - Standing Anti-Rotation Press with Anchored Resistance  - 1 x daily - 7 x weekly - 2 sets - 10 reps  ASSESSMENT:  CLINICAL IMPRESSION:  Today's session continued to focus on postural and core strengthening.  Good challenge with tandem stance; most difficulty with right foot behind secondary to partial amputation right foot.  Added shoulder external rotation with theraband to HEP; issued theraband for home use. Still has difficulty with cervical retraction technique but overall standing up straighter; more postural awareness.  Patient will benefit from continued skilled therapy services to address deficits and promote return to optimal function.      OBJECTIVE IMPAIRMENTS: Abnormal gait, decreased activity tolerance, decreased balance, decreased endurance, decreased mobility, difficulty walking, decreased ROM, decreased strength, hypomobility, increased muscle spasms, impaired flexibility, impaired sensation, improper body mechanics, postural dysfunction, and pain.   ACTIVITY LIMITATIONS: carrying, lifting, bending, sitting, standing, squatting, sleeping, stairs, transfers, reach over head, hygiene/grooming, locomotion level, and caring for others  PARTICIPATION LIMITATIONS: meal prep, cleaning, laundry, shopping, community activity, and yard work  PERSONAL FACTORS: Fitness, Time since onset of injury/illness/exacerbation, and 3+ comorbidities: DM, chronic pain, hx foot ulcer/amputation, sedentary, tobacco use  are also affecting patient's functional outcome.   REHAB POTENTIAL: Good  CLINICAL DECISION MAKING: Stable/uncomplicated  EVALUATION COMPLEXITY: Low    GOALS: Goals reviewed  with patient? Yes  SHORT TERM GOALS: Target date: 12/18/2021  Patient will be independent with HEP in order to improve functional outcomes. Baseline:  Goal status: MET  2.  Patient will report at least 25% improvement in symptoms for improved quality of life. Baseline: Goal status: MET    LONG TERM GOALS: Target date: 12/25/2021  Patient will report at least 75% improvement in symptoms for improved quality of life. Baseline:  Goal status: IN PROGRESS  2.  Patient will improve FOTO score by at least 15 points in order to indicate improved tolerance to activity. Baseline: 40% function 12/26/21 63 % function Goal status: MET  3.  Patient will demonstrate at least 25% improvement in lumbar ROM in all restricted planes for improved ability to move trunk while completing chores. Baseline: see above Goal status: MET  4.  Patient will be able to ambulate at least 425 feet in in order to demonstrate improved tolerance to activity. Baseline: 375 feet with SPC 12/26/21: 405 feet with SPC Goal status: IN PROGRESS  5.  Patient will be able to complete 5x STS in under 14 seconds in order to reduce the risk of falls. Baseline: 22.61 seconds without UE use  12/26/21 14.09 seconds without UE use Goal status: IN PROGRESS  6.  Patient will demonstrate grade of 5/5 MMT grade in all tested musculature  as evidence of improved strength to assist with stair ambulation and gait.   Baseline: see above Goal status: IN PROGRESS   PLAN:  PT FREQUENCY: 2x/week  PT DURATION: 4 weeks  PLANNED INTERVENTIONS: Therapeutic exercises, Therapeutic activity, Neuromuscular re-education, Balance training, Gait training, Patient/Family education, Joint manipulation, Joint mobilization, Stair training, Orthotic/Fit training, DME instructions, Aquatic Therapy, Dry Needling, Electrical stimulation, Spinal manipulation, Spinal mobilization, Cryotherapy, Moist heat, Compression bandaging, scar mobilization,  Splintting, Taping, Traction, Ultrasound, Ionotophoresis 4mg /ml Dexamethasone, and Manual therapy   PLAN FOR NEXT SESSION:  postural strength, core and glute strength.  Add standing balance activities, progress core and posture strengthening.  Reassess next visit   1:52 PM, 01/19/22 Juliany Daughety Small Demetreus Lothamer MPT [facility] [place] [phone]

## 2022-01-23 ENCOUNTER — Encounter (HOSPITAL_COMMUNITY): Payer: Medicare HMO | Admitting: Physical Therapy

## 2022-01-25 ENCOUNTER — Encounter (HOSPITAL_COMMUNITY): Payer: Self-pay

## 2022-01-25 ENCOUNTER — Ambulatory Visit (HOSPITAL_COMMUNITY): Payer: No Typology Code available for payment source

## 2022-01-25 DIAGNOSIS — R2689 Other abnormalities of gait and mobility: Secondary | ICD-10-CM

## 2022-01-25 DIAGNOSIS — M5459 Other low back pain: Secondary | ICD-10-CM

## 2022-01-25 DIAGNOSIS — M6281 Muscle weakness (generalized): Secondary | ICD-10-CM

## 2022-01-25 DIAGNOSIS — R293 Abnormal posture: Secondary | ICD-10-CM

## 2022-01-25 DIAGNOSIS — R29898 Other symptoms and signs involving the musculoskeletal system: Secondary | ICD-10-CM

## 2022-01-25 NOTE — Therapy (Addendum)
OUTPATIENT PHYSICAL THERAPY THORACOLUMBAR TREATMENT   Patient Name: Mitchell [name] MRN: [id number] DOB:[birth year], 72 y.o., male Today's Date: 01/25/2022  Progress Note   Reporting Period 12/26/21 to 01/25/22   See note below for Objective Data and Assessment of Progress/Goals       PT End of Session - 01/25/22 1450     Visit Number 10    Number of Visits 16    Date for PT Re-Evaluation 02/16/22    Authorization Type [employer]  (no vl, no auth)    PT Start Time 1350    PT Stop Time 1430    PT Time Calculation (min) 40 min    Activity Tolerance Patient tolerated treatment well;No increased pain    Behavior During Therapy [facility] for tasks assessed/performed                  Past Medical History:  Diagnosis Date   Arthritis    Cellulitis of right lower extremity 05/09/2014   Diabetes mellitus without complication (HCC)    Diabetic foot ulcer (HCC) 07/07/2014   Status post bedside debridement of multiple right plantar ulcers by podiatrist, Dr. Reynolds Bowl.   Hypercholesterolemia    IDA (iron deficiency anemia)    Maggot infestation    right foot ulcer   Obesity 05/09/2014   Tobacco abuse 07/07/2014   Past Surgical History:  Procedure Laterality Date   APPENDECTOMY     APPLICATION OF WOUND VAC Right 10/27/2020   Procedure: APPLICATION OF WOUND VAC;  Surgeon: Nada Libman, MD;  Location: [facility] OR;  Service: Vascular;  Laterality: Right;   ESOPHAGOGASTRODUODENOSCOPY (EGD) WITH PROPOFOL N/A 10/28/2020   Procedure: ESOPHAGOGASTRODUODENOSCOPY (EGD) WITH PROPOFOL;  Surgeon: [name], MD;  Location: [facility] ENDOSCOPY;  Service: Gastroenterology;  Laterality: N/A;   HEMOSTASIS CLIP PLACEMENT  10/28/2020   Procedure: HEMOSTASIS CLIP PLACEMENT;  Surgeon: [name], MD;  Location: [facility] ENDOSCOPY;  Service: Gastroenterology;;   HOT HEMOSTASIS N/A 10/28/2020   Procedure: HOT HEMOSTASIS (ARGON PLASMA COAGULATION/BICAP);  Surgeon: [name], MD;  Location: [facility] ENDOSCOPY;   Service: Gastroenterology;  Laterality: N/A;   PERIPHERAL VASCULAR CATHETERIZATION N/A 07/22/2015   Procedure: Abdominal Aortogram w/Lower Extremity;  Surgeon: Sherren Kerns, MD;  Location: [facility] INVASIVE CV LAB;  Service: Cardiovascular;  Laterality: N/A;   TRANSMETATARSAL AMPUTATION Right 10/27/2020   Procedure: TRANSMETATARSAL AMPUTATION;  Surgeon: Nada Libman, MD;  Location: [facility] OR;  Service: Vascular;  Laterality: Right;   Patient Active Problem List   Diagnosis Date Noted   Foot osteomyelitis, right (HCC) 10/25/2020   GI bleed 10/25/2020   Blood loss anemia 10/25/2020   Osteomyelitis of right foot (HCC) 10/25/2020   Closed fracture of proximal end of right humerus with routine healing 02/25/2019   Pre-ulcerative calluses 04/20/2018   Myositis 07/08/2014   Diabetic foot ulcer associated with type 2 diabetes mellitus (HCC) 07/07/2014   Tobacco abuse 07/07/2014   Diabetic ulcer of right foot (HCC) 05/12/2014   Pulse weakness    Peripheral neuropathy    Cellulitis of right lower extremity 05/09/2014   Obesity 05/09/2014   Cellulitis of right lower leg 05/09/2014   Hyperglycemia 03/21/2013   Type 2 diabetes mellitus with foot ulcer (HCC) 03/21/2013   Noncompliance with medications due to cost issues 03/21/2013    PCP: Kirstie Peri, MD  REFERRING PROVIDER: Kirstie Peri, MD  REFERRING DIAG: LBP  Rationale for Evaluation and Treatment: Rehabilitation  THERAPY DIAG:  Other low back pain  Other abnormalities of gait and  mobility  Other symptoms and signs involving the musculoskeletal system  Abnormal posture  Muscle weakness (generalized)  ONSET DATE: 1 year  SUBJECTIVE:                                                                                                                                                                                           SUBJECTIVE STATEMENT: Pt stated he is feeling rough today, increased back and hip pain pain scale 5-6/10.  Feels  the weather has increased pain.  Pt stated he loves coming to therapy; feels he has improved by 60%.  Eval subjective: Patient states back has been hurting since he was in the hospital for his foot. His back bothers him with bending, chores, getting out of bed and more. Back is rough with trying to do stuff.   PERTINENT HISTORY:  DM, chronic pain, hx foot ulcer/amputation  PAIN:  Are you having pain? Yes: NPRS scale: 2-3/10 Pain location: low back and BLE Pain description: achey in back and tight BLE Aggravating factors: chores, bending, movement  Relieving factors: none  PRECAUTIONS: None  WEIGHT BEARING RESTRICTIONS: No  FALLS:  Has patient fallen in last 6 months? No  LIVING ENVIRONMENT: Lives with: lives alone Lives in: House/apartment Stairs:  ramp Has following equipment at home: Single point cane  OCCUPATION: Retired  PLOF: Independent  PATIENT GOALS: decrease back pain  NEXT MD VISIT: not sure  OBJECTIVE:   PATIENT SURVEYS:  FOTO 40% function  12/26/21: 63% function  SCREENING FOR RED FLAGS: Bowel or bladder incontinence: No Spinal tumors: No Cauda equina syndrome: No Compression fracture: No Abdominal aneurysm: No  COGNITION: Overall cognitive status: Within functional limits for tasks assessed     SENSATION: WFL   POSTURE: rounded shoulders, forward head, decreased lumbar lordosis, and increased thoracic kyphosis  PALPATION: TTP lumbar paraspinals   LUMBAR ROM:   AROM eval 12/26/21  Flexion 0% limited 0% limited  Extension 90% limited 75% limited  Right lateral flexion 50% limited 25% limited  Left lateral flexion 50% limited 25% limited  Right rotation 50% limited 25% limited  Left rotation 50% limited 25% limited   (Blank rows = not tested)  LOWER EXTREMITY ROM:   WFL for tasks assessed  Active  Right eval Left eval  Hip flexion    Hip extension    Hip abduction    Hip adduction    Hip internal rotation    Hip external  rotation    Knee flexion    Knee extension    Ankle dorsiflexion    Ankle plantarflexion    Ankle inversion    Ankle eversion     (  Blank rows = not tested)  LOWER EXTREMITY MMT:    MMT Right eval Left eval Right 12/26/21 Left 12/26/21 Right 01/25/22 Left  01/25/22  Hip flexion 4+ 4+ 5 5    Hip extension 3+ 4- 4+ 4+ 4+/5 4+/5  Hip abduction 3+ 4- 4+ 5 5/5 5/5  Hip adduction        Hip internal rotation        Hip external rotation        Knee flexion 4+ 5 5 5     Knee extension 4+ 5 5 5     Ankle dorsiflexion        Ankle plantarflexion        Ankle inversion        Ankle eversion         (Blank rows = not tested)    FUNCTIONAL TESTS:  5 times sit to stand: 22.61 seconds without UE use 2 minute walk test: 375 feet with SPC Transfer STS: labored, requires bilateral UE support  GAIT: Distance walked: 375 feet Assistive device utilized: Single point cane Level of assistance: Modified independence Comments: , gradually increasing trunk flexion, decreased L arm swing, Trendelenburg on L   Reassessment 12/26/21 FUNCTIONAL TESTS:  5 times sit to stand: 14.09 seconds without UE use 2 minute walk test: 405 feet with SPC Transfer STS: labored, requires bilateral UE support  GAIT: Distance walked: 405 feet Assistive device utilized: Single point cane Level of assistance: Modified independence Comments: , gradually increasing trunk flexion, decreased foot clearance bilaterally   TODAY'S TREATMENT:                                                                                                                              DATE:  01/25/22:   Nustep 5x L3 average SPM 73 while answering FOTO questions FOTO 55.9% 317ft with SPC 5STS 01/25/22: 12.66" MMT- see above  Standing: Marching alternating: 20x Tandem stance 3x30"  01/19/2022 Seated  Cervical retraction 2 x 10 Shoulder bilateral external rotation with BTB 2 x 10  Sit to stand x 10 no UE  assist  Standing: Tandem stance x 20" each Hip vectors 3" hold x 8 each BTB scapular retractions 2 x 10 BTB shoulder extensions 2 x 10    01/17/22 Alternating march intermittent 1 UE 10x 3" BTB shoulder extension 15x 5" BTB row 15x 5" BTB palloff 10x 2 sets Minisquats  10x STS eccentric control  03/06/21 Seated cervical retraction 2 x 10  Standing hip abduction GTB at knees 2 x 10 bilateral  Row GTB 2 x 10  Shoulder extension GTB 2 x 10  Palof press 2x10 GTB  12/28/21: Standing: wall arch Minisquat front of chair, cueing for mechanics STS 10x Sidestep down hallway 2RT with HHA, 2RT no HHA Back against wall UE flexion 10x Seated Wback10  X to V Supine: head elevated Chin tucks 10 Bridge 10x 5"  12/26/21 Reassessment   12/18/21 LTR  5 x 10 second holds Bridge 10 x 5 second holds DKTC with green ball 1x 10 5 second holds Sidelying hip abduction 2x 10 bilateral  Supine active hamstring stretch with towel at knee 5 x 10 second holds bilateral  X to V  2x 10  STS 2 x 10   12/15/21 Log rolling  Supine: LTR 5x 10" Decompression 2-5 5x 5" Bridge 10x5" SKTC 2x 30"  Sidelying: Clam with GTB 10x 5"  12/13/21 STS 10x   Standing: Hip abduction 10x 3"  Seated posture education Rows RTB 2x 10  Supine:  Chin tuck 10 x3" Abdominal sets 10x 5" Bridge 10x   11/27/21  STS 2x 10  Standing hip abduction 2 x 10 bilateral    PATIENT EDUCATION:  Education details: 12/18/21: HEP;  EVAL: Patient educated on exam findings, POC, scope of PT, HEP, and posture. Person educated: Patient Education method: Explanation, Demonstration, and Handouts Education comprehension: verbalized understanding, returned demonstration, verbal cues required, and tactile cues required  HOME EXERCISE PROGRAM: Access Code: [email or web address]  01/19/2022 resisted shoulder external rotation with BTB  Date: 11/27/2021 - Sit to Stand  - 2 x daily - 7 x weekly - 2 sets - 10 reps - Standing Hip  Abduction with Counter Support  - 2 x daily - 7 x weekly - 2 sets - 10 reps  Date: 12/13/2021 - Sit to Stand  - 2 x daily - 7 x weekly - 2 sets - 10 reps - Standing Hip Abduction with Counter Support  - 2 x daily - 7 x weekly - 2 sets - 10 reps - Correct Seated Posture  - 5 x daily - 7 x weekly - 1 sets - 2 reps - Seated Scapular Retraction  - 1 x daily - 7 x weekly - 3 sets - 10 reps - 5" hold - Supine Chin Tuck  - 1 x daily - 7 x weekly - 2 sets - 10 reps - 5" hold - Supine Bridge  - 1 x daily - 7 x weekly - 3 sets - 10 reps - 5" hold  12/15/21: log rolling, LTR, decompression  01/03/22 - Standing Shoulder Row with Anchored Resistance  - 1 x daily - 7 x weekly - 2 sets - 10 reps - Shoulder extension with resistance - Neutral  - 1 x daily - 7 x weekly - 2 sets - 10 reps - Standing Anti-Rotation Press with Anchored Resistance  - 1 x daily - 7 x weekly - 2 sets - 10 reps  ASSESSMENT:  CLINICAL IMPRESSION: 10th visit progress note this session with subjective and objective testing/questionaire complete.  Pt reports increased pain at entrance and stated he's had a rough day through session.  Pt presents with decreased cadence during 2MWT and strength with minimal improvements.  Pt continues to require HHA and use of AD with balance activities for fall prevention.  Feels he has improved 60% since began therapy and wishes to continue with OPPT to address pain control and balance training.  Does present with improved time with 5 STS and reports of pain reduced at EOS.  Reviewed importance of HEP compliance and beginning walking program.  Patient has met 2/2 short term goals and 3/6 long term goals with ability to complete HEP and improvement in symptoms, strength, ROM, activity tolerance, gait, balance, and functional mobility. Remaining goals not met due to continued deficits in symptoms, gait, balance, and strength. Patient has made good progress toward remaining goals and is motivated  to continue  PT to improve deficits. Extending POC 2x/week for 3 week. Patient will continue to benefit from skilled physical therapy in order to improve function and reduce impairment.   7:43 AM, 01/30/22 Mearl Latin PT, DPT Physical Therapist at [facility]  OBJECTIVE IMPAIRMENTS: Abnormal gait, decreased activity tolerance, decreased balance, decreased endurance, decreased mobility, difficulty walking, decreased ROM, decreased strength, hypomobility, increased muscle spasms, impaired flexibility, impaired sensation, improper body mechanics, postural dysfunction, and pain.   ACTIVITY LIMITATIONS: carrying, lifting, bending, sitting, standing, squatting, sleeping, stairs, transfers, reach over head, hygiene/grooming, locomotion level, and caring for others  PARTICIPATION LIMITATIONS: meal prep, cleaning, laundry, shopping, community activity, and yard work  PERSONAL FACTORS: Fitness, Time since onset of injury/illness/exacerbation, and 3+ comorbidities: DM, chronic pain, hx foot ulcer/amputation, sedentary, tobacco use  are also affecting patient's functional outcome.   REHAB POTENTIAL: Good  CLINICAL DECISION MAKING: Stable/uncomplicated  EVALUATION COMPLEXITY: Low    GOALS: Goals reviewed with patient? Yes  SHORT TERM GOALS: Target date: 12/18/2021  Patient will be independent with HEP in order to improve functional outcomes. Baseline:  Goal status: MET  2.  Patient will report at least 25% improvement in symptoms for improved quality of life. Baseline: Goal status: MET    LONG TERM GOALS: Target date: 12/25/2021  Patient will report at least 75% improvement in symptoms for improved quality of life. Baseline:  01/25/22:  feels improvements 60% Goal status: IN PROGRESS  2.  Patient will improve FOTO score by at least 15 points in order to indicate improved tolerance to activity. Baseline: 40% function 12/26/21 63 % function; 01/25/22: 56% function Goal  status: MET  3.  Patient will demonstrate at least 25% improvement in lumbar ROM in all restricted planes for improved ability to move trunk while completing chores. Baseline: see above Goal status: MET  4.  Patient will be able to ambulate at least 425 feet in 2MWT in order to demonstrate improved tolerance to activity. Baseline: 375 feet with SPC 12/26/21: 405 feet with SPC; 01/25/22: 2MWT 398ft with SPC Goal status: IN PROGRESS  5.  Patient will be able to complete 5x STS in under 14 seconds in order to reduce the risk of falls. Baseline: 22.61 seconds without UE use  12/26/21 14.09 seconds without UE use 01/25/22: 12.66" without UE A Goal status: MET  6.  Patient will demonstrate grade of 5/5 MMT grade in all tested musculature as evidence of improved strength to assist with stair ambulation and gait.   Baseline: see above Goal status: IN PROGRESS   PLAN:  PT FREQUENCY: 2x/week  PT DURATION: 3 weeks  PLANNED INTERVENTIONS: Therapeutic exercises, Therapeutic activity, Neuromuscular re-education, Balance training, Gait training, Patient/Family education, Joint manipulation, Joint mobilization, Stair training, Orthotic/Fit training, DME instructions, Aquatic Therapy, Dry Needling, Electrical stimulation, Spinal manipulation, Spinal mobilization, Cryotherapy, Moist heat, Compression bandaging, scar mobilization, Splintting, Taping, Traction, Ultrasound, Ionotophoresis 4mg /ml Dexamethasone, and Manual therapy   PLAN FOR NEXT SESSION:  postural strength, core and glute strength.  Add standing balance activities, progress core and posture strengthening.  Continue for 3 more weeks, 6 sessions total.  Ihor Austin, LPTA/CLT; CBIS [phone]  2:53 PM, 01/25/22

## 2022-01-30 ENCOUNTER — Encounter (HOSPITAL_COMMUNITY): Payer: Medicare HMO | Admitting: Physical Therapy

## 2022-01-30 ENCOUNTER — Ambulatory Visit (HOSPITAL_COMMUNITY): Payer: No Typology Code available for payment source | Admitting: Physical Therapy

## 2022-01-30 DIAGNOSIS — M5459 Other low back pain: Secondary | ICD-10-CM

## 2022-01-30 DIAGNOSIS — R29898 Other symptoms and signs involving the musculoskeletal system: Secondary | ICD-10-CM

## 2022-01-30 DIAGNOSIS — M6281 Muscle weakness (generalized): Secondary | ICD-10-CM

## 2022-01-30 DIAGNOSIS — R293 Abnormal posture: Secondary | ICD-10-CM

## 2022-01-30 DIAGNOSIS — R2689 Other abnormalities of gait and mobility: Secondary | ICD-10-CM

## 2022-01-30 NOTE — Addendum Note (Signed)
Addended by: Mearl Latin on: 01/30/2022 07:45 AM   Modules accepted: Orders

## 2022-01-30 NOTE — Therapy (Signed)
OUTPATIENT PHYSICAL THERAPY THORACOLUMBAR TREATMENT   Patient Name: Mitchell [name] MRN: [id number] DOB:03/09/1950, 71 y.o., male Today's Date: 01/30/2022      PT End of Session - 01/30/22 1427     Visit Number 11    Number of Visits 16    Date for PT Re-Evaluation 02/16/22    Authorization Type [employer]  (no vl, no auth)    Progress Note Due on Visit 20    PT Start Time 1427    PT Stop Time 1508    PT Time Calculation (min) 41 min    Activity Tolerance Patient tolerated treatment well;No increased pain    Behavior During Therapy [facility] for tasks assessed/performed                  Past Medical History:  Diagnosis Date   Arthritis    Cellulitis of right lower extremity 05/09/2014   Diabetes mellitus without complication (HCC)    Diabetic foot ulcer ([place]) 07/07/2014   Status post bedside debridement of multiple right plantar ulcers by podiatrist, Dr. Laurena Spies.   Hypercholesterolemia    IDA (iron deficiency anemia)    Maggot infestation    right foot ulcer   Obesity 05/09/2014   Tobacco abuse 07/07/2014   Past Surgical History:  Procedure Laterality Date   APPENDECTOMY     APPLICATION OF WOUND VAC Right 10/27/2020   Procedure: APPLICATION OF WOUND VAC;  Surgeon: Serafina Mitchell, MD;  Location: [facility] OR;  Service: Vascular;  Laterality: Right;   ESOPHAGOGASTRODUODENOSCOPY (EGD) WITH PROPOFOL N/A 10/28/2020   Procedure: ESOPHAGOGASTRODUODENOSCOPY (EGD) WITH PROPOFOL;  Surgeon: Sharyn Creamer, MD;  Location: [place];  Service: Gastroenterology;  Laterality: N/A;   HEMOSTASIS CLIP PLACEMENT  10/28/2020   Procedure: HEMOSTASIS CLIP PLACEMENT;  Surgeon: Sharyn Creamer, MD;  Location: [facility] ENDOSCOPY;  Service: Gastroenterology;;   HOT HEMOSTASIS N/A 10/28/2020   Procedure: HOT HEMOSTASIS (ARGON PLASMA COAGULATION/BICAP);  Surgeon: Sharyn Creamer, MD;  Location: [place];  Service: Gastroenterology;  Laterality: N/A;   PERIPHERAL VASCULAR CATHETERIZATION N/A 07/22/2015    Procedure: Abdominal Aortogram w/Lower Extremity;  Surgeon: Elam Dutch, MD;  Location: [place] [facility] LAB;  Service: Cardiovascular;  Laterality: N/A;   TRANSMETATARSAL AMPUTATION Right 10/27/2020   Procedure: TRANSMETATARSAL AMPUTATION;  Surgeon: Serafina Mitchell, MD;  Location: [facility] OR;  Service: Vascular;  Laterality: Right;   Patient Active Problem List   Diagnosis Date Noted   Foot osteomyelitis, right ([place]) 10/25/2020   GI bleed 10/25/2020   Blood loss anemia 10/25/2020   Osteomyelitis of right foot ([place]) 10/25/2020   Closed fracture of proximal end of right humerus with routine healing 02/25/2019   Pre-ulcerative calluses 04/20/2018   Myositis 07/08/2014   Diabetic foot ulcer associated with type 2 diabetes mellitus ([place]) 07/07/2014   Tobacco abuse 07/07/2014   Diabetic ulcer of right foot ([place]) 05/12/2014   Pulse weakness    Peripheral neuropathy    Cellulitis of right lower extremity 05/09/2014   Obesity 05/09/2014   Cellulitis of right lower leg 05/09/2014   Hyperglycemia 03/21/2013   Type 2 diabetes mellitus with foot ulcer ([place]) 03/21/2013   Noncompliance with medications due to cost issues 03/21/2013    PCP: Monico Blitz, MD  REFERRING PROVIDER: Monico Blitz, MD  REFERRING DIAG: LBP  Rationale for Evaluation and Treatment: Rehabilitation  THERAPY DIAG:  Other low back pain  Other abnormalities of gait and mobility  Other symptoms and signs involving the musculoskeletal system  Abnormal posture  Muscle weakness (generalized)  ONSET DATE: 1 year  SUBJECTIVE:                                                                                                                                                                                           SUBJECTIVE STATEMENT: Pt stated he got himself a tablet. Back is getting a little better. Able to get around the house better with chroes.   Eval subjective: Patient states back has been hurting since he was in  the hospital for his foot. His back bothers him with bending, chores, getting out of bed and more. Back is rough with trying to do stuff.   PERTINENT HISTORY:  DM, chronic pain, hx foot ulcer/amputation  PAIN:  Are you having pain? Yes: NPRS scale: 3/10 Pain location: low back and BLE Pain description: achey in back and tight BLE Aggravating factors: chores, bending, movement  Relieving factors: none  PRECAUTIONS: None  WEIGHT BEARING RESTRICTIONS: No  FALLS:  Has patient fallen in last 6 months? No  LIVING ENVIRONMENT: Lives with: lives alone Lives in: House/apartment Stairs:  ramp Has following equipment at home: Single point cane  OCCUPATION: Retired  PLOF: Independent  PATIENT GOALS: decrease back pain  NEXT MD VISIT: not sure  OBJECTIVE:   PATIENT SURVEYS:  FOTO 40% function  12/26/21: 63% function  SCREENING FOR RED FLAGS: Bowel or bladder incontinence: No Spinal tumors: No Cauda equina syndrome: No Compression fracture: No Abdominal aneurysm: No  COGNITION: Overall cognitive status: Within functional limits for tasks assessed     SENSATION: WFL   POSTURE: rounded shoulders, forward head, decreased lumbar lordosis, and increased thoracic kyphosis  PALPATION: TTP lumbar paraspinals   LUMBAR ROM:   AROM eval 12/26/21  Flexion 0% limited 0% limited  Extension 90% limited 75% limited  Right lateral flexion 50% limited 25% limited  Left lateral flexion 50% limited 25% limited  Right rotation 50% limited 25% limited  Left rotation 50% limited 25% limited   (Blank rows = not tested)  LOWER EXTREMITY ROM:   WFL for tasks assessed  Active  Right eval Left eval  Hip flexion    Hip extension    Hip abduction    Hip adduction    Hip internal rotation    Hip external rotation    Knee flexion    Knee extension    Ankle dorsiflexion    Ankle plantarflexion    Ankle inversion    Ankle eversion     (Blank rows = not tested)  LOWER EXTREMITY  MMT:    MMT Right eval Left eval Right 12/26/21 Left 12/26/21 Right 01/25/22  Left  01/25/22  Hip flexion 4+ 4+ 5 5    Hip extension 3+ 4- 4+ 4+ 4+/5 4+/5  Hip abduction 3+ 4- 4+ 5 5/5 5/5  Hip adduction        Hip internal rotation        Hip external rotation        Knee flexion 4+ 5 5 5     Knee extension 4+ 5 5 5     Ankle dorsiflexion        Ankle plantarflexion        Ankle inversion        Ankle eversion         (Blank rows = not tested)    FUNCTIONAL TESTS:  5 times sit to stand: 22.61 seconds without UE use 2 minute walk test: 375 feet with SPC Transfer STS: labored, requires bilateral UE support  GAIT: Distance walked: 375 feet Assistive device utilized: Single point cane Level of assistance: Modified independence Comments: , gradually increasing trunk flexion, decreased L arm swing, Trendelenburg on L   Reassessment 12/26/21 FUNCTIONAL TESTS:  5 times sit to stand: 14.09 seconds without UE use 2 minute walk test: 405 feet with SPC Transfer STS: labored, requires bilateral UE support  GAIT: Distance walked: 405 feet Assistive device utilized: Single point cane Level of assistance: Modified independence Comments: , gradually increasing trunk flexion, decreased foot clearance bilaterally   TODAY'S TREATMENT:                                                                                                                              DATE:  01/30/22 Standing row BTB 2 x 15  Standing shoulder extension BTB 2 x 15 Palof press BTB 2 x 15 bilateral Lift BTB 2 x 10 each bilateral Step up 6 inch 1 x 10 2 HHA, 2x 10 1 HHA  01/25/22:   Nustep 5x L3 average SPM 73 while answering FOTO questions FOTO 55.9% 02/01/22 326ft with SPC 5STS 01/25/22: 12.66" MMT- see above  Standing: Marching alternating: 20x Tandem stance 3x30"  01/19/2022 Seated  Cervical retraction 2 x 10 Shoulder bilateral external rotation with BTB 2 x 10  Sit to stand x 10 no UE  assist  Standing: Tandem stance x 20" each Hip vectors 3" hold x 8 each BTB scapular retractions 2 x 10 BTB shoulder extensions 2 x 10    01/17/22 Alternating march intermittent 1 UE 10x 3" BTB shoulder extension 15x 5" BTB row 15x 5" BTB palloff 10x 2 sets Minisquats  10x STS eccentric control  03/06/21 Seated cervical retraction 2 x 10  Standing hip abduction GTB at knees 2 x 10 bilateral  Row GTB 2 x 10  Shoulder extension GTB 2 x 10  Palof press 2x10 GTB   PATIENT EDUCATION:  Education details: 12/18/21: HEP;  EVAL: Patient educated on exam findings, POC, scope of PT, HEP, and posture. Person educated: Patient Education method: Explanation, Demonstration, and  Handouts Education comprehension: verbalized understanding, returned demonstration, verbal cues required, and tactile cues required  HOME EXERCISE PROGRAM: Access Code: [email or web address]  01/30/22- Reverse Chop with Resistance  - 1 x daily - 7 x weekly - 2 sets - 10 reps  01/19/2022 resisted shoulder external rotation with BTB  Date: 11/27/2021 - Sit to Stand  - 2 x daily - 7 x weekly - 2 sets - 10 reps - Standing Hip Abduction with Counter Support  - 2 x daily - 7 x weekly - 2 sets - 10 reps  Date: 12/13/2021 - Sit to Stand  - 2 x daily - 7 x weekly - 2 sets - 10 reps - Standing Hip Abduction with Counter Support  - 2 x daily - 7 x weekly - 2 sets - 10 reps - Correct Seated Posture  - 5 x daily - 7 x weekly - 1 sets - 2 reps - Seated Scapular Retraction  - 1 x daily - 7 x weekly - 3 sets - 10 reps - 5" hold - Supine Chin Tuck  - 1 x daily - 7 x weekly - 2 sets - 10 reps - 5" hold - Supine Bridge  - 1 x daily - 7 x weekly - 3 sets - 10 reps - 5" hold  12/15/21: log rolling, LTR, decompression  01/03/22 - Standing Shoulder Row with Anchored Resistance  - 1 x daily - 7 x weekly - 2 sets - 10 reps - Shoulder extension with resistance - Neutral  - 1 x daily - 7 x weekly - 2 sets - 10 reps - Standing Anti-Rotation Press  with Anchored Resistance  - 1 x daily - 7 x weekly - 2 sets - 10 reps  ASSESSMENT:  CLINICAL IMPRESSION: Continued with resisted postural strengthening which is tolerated well. Intermittent cueing for posture with fair/good carry over. Patient requires cueing for rest breaks as he tends to continue performing exercises past reps given. Increased difficulty with step up on LLE due to weakness. Overall progressing well. Patient will continue to benefit from physical therapy in order to improve function and reduce impairment.   OBJECTIVE IMPAIRMENTS: Abnormal gait, decreased activity tolerance, decreased balance, decreased endurance, decreased mobility, difficulty walking, decreased ROM, decreased strength, hypomobility, increased muscle spasms, impaired flexibility, impaired sensation, improper body mechanics, postural dysfunction, and pain.   ACTIVITY LIMITATIONS: carrying, lifting, bending, sitting, standing, squatting, sleeping, stairs, transfers, reach over head, hygiene/grooming, locomotion level, and caring for others  PARTICIPATION LIMITATIONS: meal prep, cleaning, laundry, shopping, community activity, and yard work  PERSONAL FACTORS: Fitness, Time since onset of injury/illness/exacerbation, and 3+ comorbidities: DM, chronic pain, hx foot ulcer/amputation, sedentary, tobacco use  are also affecting patient's functional outcome.   REHAB POTENTIAL: Good  CLINICAL DECISION MAKING: Stable/uncomplicated  EVALUATION COMPLEXITY: Low    GOALS: Goals reviewed with patient? Yes  SHORT TERM GOALS: Target date: 12/18/2021  Patient will be independent with HEP in order to improve functional outcomes. Baseline:  Goal status: MET  2.  Patient will report at least 25% improvement in symptoms for improved quality of life. Baseline: Goal status: MET    LONG TERM GOALS: Target date: 12/25/2021  Patient will report at least 75% improvement in symptoms for improved quality of life. Baseline:   01/25/22:  feels improvements 60% Goal status: IN PROGRESS  2.  Patient will improve FOTO score by at least 15 points in order to indicate improved tolerance to activity. Baseline: 40% function 12/26/21 63 % function; 01/25/22: 56% function  Goal status: MET  3.  Patient will demonstrate at least 25% improvement in lumbar ROM in all restricted planes for improved ability to move trunk while completing chores. Baseline: see above Goal status: MET  4.  Patient will be able to ambulate at least 425 feet in in order to demonstrate improved tolerance to activity. Baseline: 375 feet with SPC 12/26/21: 405 feet with SPC; 01/25/22: 338ft with SPC Goal status: IN PROGRESS  5.  Patient will be able to complete 5x STS in under 14 seconds in order to reduce the risk of falls. Baseline: 22.61 seconds without UE use  12/26/21 14.09 seconds without UE use 01/25/22: 12.66" without UE A Goal status: MET  6.  Patient will demonstrate grade of 5/5 MMT grade in all tested musculature as evidence of improved strength to assist with stair ambulation and gait.   Baseline: see above Goal status: IN PROGRESS   PLAN:  PT FREQUENCY: 2x/week  PT DURATION: 3 weeks  PLANNED INTERVENTIONS: Therapeutic exercises, Therapeutic activity, Neuromuscular re-education, Balance training, Gait training, Patient/Family education, Joint manipulation, Joint mobilization, Stair training, Orthotic/Fit training, DME instructions, Aquatic Therapy, Dry Needling, Electrical stimulation, Spinal manipulation, Spinal mobilization, Cryotherapy, Moist heat, Compression bandaging, scar mobilization, Splintting, Taping, Traction, Ultrasound, Ionotophoresis 4mg /ml Dexamethasone, and Manual therapy   PLAN FOR NEXT SESSION:  postural strength, core and glute strength.  Add standing balance activities, progress core and posture strengthening.   3:08 PM, 01/30/22 02/01/22 PT, DPT Physical Therapist at [facility]

## 2022-02-01 ENCOUNTER — Encounter (HOSPITAL_COMMUNITY): Payer: Medicare HMO | Admitting: Physical Therapy

## 2022-02-06 ENCOUNTER — Encounter (HOSPITAL_COMMUNITY): Payer: Medicare HMO | Admitting: Physical Therapy

## 2022-02-06 ENCOUNTER — Telehealth (HOSPITAL_COMMUNITY): Payer: Self-pay | Admitting: Physical Therapy

## 2022-02-06 NOTE — Telephone Encounter (Signed)
Pt did not show for appt.  Called and pt reported his battery was dead on his car and this is why he did not show.  Pt aware of next scheduled appt.  Teena Irani, PTA/CLT [place] Ph: [phone]

## 2022-02-08 ENCOUNTER — Ambulatory Visit (HOSPITAL_COMMUNITY): Payer: No Typology Code available for payment source | Admitting: Physical Therapy

## 2022-02-08 DIAGNOSIS — M5459 Other low back pain: Secondary | ICD-10-CM | POA: Diagnosis not present

## 2022-02-08 DIAGNOSIS — R2689 Other abnormalities of gait and mobility: Secondary | ICD-10-CM

## 2022-02-08 DIAGNOSIS — R29898 Other symptoms and signs involving the musculoskeletal system: Secondary | ICD-10-CM

## 2022-02-08 DIAGNOSIS — R293 Abnormal posture: Secondary | ICD-10-CM

## 2022-02-08 NOTE — Therapy (Signed)
OUTPATIENT PHYSICAL THERAPY THORACOLUMBAR TREATMENT   Patient Name: Mitchell [name] MRN: [id number] DOB:09/13/1950, 72 y.o., male Today's Date: 02/08/2022      PT End of Session - 02/08/22 1344     Visit Number 12    Number of Visits 16    Date for PT Re-Evaluation 02/16/22    Authorization Type [employer]  (no vl, no auth)    Progress Note Due on Visit 20    PT Start Time 1300    PT Stop Time 1345    PT Time Calculation (min) 45 min    Activity Tolerance Patient tolerated treatment well;No increased pain    Behavior During Therapy [facility] for tasks assessed/performed                   Past Medical History:  Diagnosis Date   Arthritis    Cellulitis of right lower extremity 05/09/2014   Diabetes mellitus without complication (HCC)    Diabetic foot ulcer (HCC) 07/07/2014   Status post bedside debridement of multiple right plantar ulcers by podiatrist, Dr. Reynolds Bowl.   Hypercholesterolemia    IDA (iron deficiency anemia)    Maggot infestation    right foot ulcer   Obesity 05/09/2014   Tobacco abuse 07/07/2014   Past Surgical History:  Procedure Laterality Date   APPENDECTOMY     APPLICATION OF WOUND VAC Right 10/27/2020   Procedure: APPLICATION OF WOUND VAC;  Surgeon: Nada Libman, MD;  Location: [facility] OR;  Service: Vascular;  Laterality: Right;   ESOPHAGOGASTRODUODENOSCOPY (EGD) WITH PROPOFOL N/A 10/28/2020   Procedure: ESOPHAGOGASTRODUODENOSCOPY (EGD) WITH PROPOFOL;  Surgeon: [name], MD;  Location: [facility] ENDOSCOPY;  Service: Gastroenterology;  Laterality: N/A;   HEMOSTASIS CLIP PLACEMENT  10/28/2020   Procedure: HEMOSTASIS CLIP PLACEMENT;  Surgeon: [name], MD;  Location: [facility] ENDOSCOPY;  Service: Gastroenterology;;   HOT HEMOSTASIS N/A 10/28/2020   Procedure: HOT HEMOSTASIS (ARGON PLASMA COAGULATION/BICAP);  Surgeon: [name], MD;  Location: [facility] ENDOSCOPY;  Service: Gastroenterology;  Laterality: N/A;   PERIPHERAL VASCULAR CATHETERIZATION N/A 07/22/2015    Procedure: Abdominal Aortogram w/Lower Extremity;  Surgeon: Sherren Kerns, MD;  Location: [facility] INVASIVE CV LAB;  Service: Cardiovascular;  Laterality: N/A;   TRANSMETATARSAL AMPUTATION Right 10/27/2020   Procedure: TRANSMETATARSAL AMPUTATION;  Surgeon: Nada Libman, MD;  Location[facility] OR;  Service: Vascular;  Laterality: Right;   Patient Active Problem List   Diagnosis Date Noted   Foot osteomyelitis, right (HCC) 10/25/2020   GI bleed 10/25/2020   Blood loss anemia 10/25/2020   Osteomyelitis of right foot (HCC) 10/25/2020   Closed fracture of proximal end of right humerus with routine healing 02/25/2019   Pre-ulcerative calluses 04/20/2018   Myositis 07/08/2014   Diabetic foot ulcer associated with type 2 diabetes mellitus (HCC) 07/07/2014   Tobacco abuse 07/07/2014   Diabetic ulcer of right foot (HCC) 05/12/2014   Pulse weakness    Peripheral neuropathy    Cellulitis of right lower extremity 05/09/2014   Obesity 05/09/2014   Cellulitis of right lower leg 05/09/2014   Hyperglycemia 03/21/2013   Type 2 diabetes mellitus with foot ulcer (HCC) 03/21/2013   Noncompliance with medications due to cost issues 03/21/2013    PCP: Kirstie Peri, MD  REFERRING PROVIDER: Kirstie Peri, MD  REFERRING DIAG: LBP  Rationale for Evaluation and Treatment: Rehabilitation  THERAPY DIAG:  Other low back pain  Other abnormalities of gait and mobility  Other symptoms and signs involving the musculoskeletal system  Abnormal posture  ONSET DATE: 1 year  SUBJECTIVE:                                                                                                                                                                                           SUBJECTIVE STATEMENT: Pt states the weather has made his joints achey.  Currently 4/10 in his back.  States he did not show for his last treatment because his battery went bad in his truck.   Eval subjective: Patient states back has been  hurting since he was in the hospital for his foot. His back bothers him with bending, chores, getting out of bed and more. Back is rough with trying to do stuff.   PERTINENT HISTORY:  DM, chronic pain, hx foot ulcer/amputation  PAIN:  Are you having pain? Yes: NPRS scale: 3/10 Pain location: low back and BLE Pain description: achey in back and tight BLE Aggravating factors: chores, bending, movement  Relieving factors: none  PRECAUTIONS: None  WEIGHT BEARING RESTRICTIONS: No  FALLS:  Has patient fallen in last 6 months? No  LIVING ENVIRONMENT: Lives with: lives alone Lives in: House/apartment Stairs:  ramp Has following equipment at home: Single point cane  OCCUPATION: Retired  PLOF: Independent  PATIENT GOALS: decrease back pain  NEXT MD VISIT: not sure  OBJECTIVE:   PATIENT SURVEYS:  FOTO 40% function  12/26/21: 63% function  SCREENING FOR RED FLAGS: Bowel or bladder incontinence: No Spinal tumors: No Cauda equina syndrome: No Compression fracture: No Abdominal aneurysm: No  COGNITION: Overall cognitive status: Within functional limits for tasks assessed     SENSATION: WFL   POSTURE: rounded shoulders, forward head, decreased lumbar lordosis, and increased thoracic kyphosis  PALPATION: TTP lumbar paraspinals   LUMBAR ROM:   AROM eval 12/26/21  Flexion 0% limited 0% limited  Extension 90% limited 75% limited  Right lateral flexion 50% limited 25% limited  Left lateral flexion 50% limited 25% limited  Right rotation 50% limited 25% limited  Left rotation 50% limited 25% limited   (Blank rows = not tested)  LOWER EXTREMITY ROM:   WFL for tasks assessed  Active  Right eval Left eval  Hip flexion    Hip extension    Hip abduction    Hip adduction    Hip internal rotation    Hip external rotation    Knee flexion    Knee extension    Ankle dorsiflexion    Ankle plantarflexion    Ankle inversion    Ankle eversion     (Blank rows = not  tested)  LOWER EXTREMITY MMT:    MMT Right eval Left  eval Right 12/26/21 Left 12/26/21 Right 01/25/22 Left  01/25/22  Hip flexion 4+ 4+ 5 5    Hip extension 3+ 4- 4+ 4+ 4+/5 4+/5  Hip abduction 3+ 4- 4+ 5 5/5 5/5  Hip adduction        Hip internal rotation        Hip external rotation        Knee flexion 4+ 5 5 5     Knee extension 4+ 5 5 5     Ankle dorsiflexion        Ankle plantarflexion        Ankle inversion        Ankle eversion         (Blank rows = not tested)    FUNCTIONAL TESTS:  5 times sit to stand: 22.61 seconds without UE use 2 minute walk test: 375 feet with SPC Transfer STS: labored, requires bilateral UE support  GAIT: Distance walked: 375 feet Assistive device utilized: Single point cane Level of assistance: Modified independence Comments: 2MWT, gradually increasing trunk flexion, decreased L arm swing, Trendelenburg on L   Reassessment 12/26/21 FUNCTIONAL TESTS:  5 times sit to stand: 14.09 seconds without UE use 2 minute walk test: 405 feet with SPC Transfer STS: labored, requires bilateral UE support  GAIT: Distance walked: 405 feet Assistive device utilized: Single point cane Level of assistance: Modified independence Comments: 2MWT, gradually increasing trunk flexion, decreased foot clearance bilaterally   TODAY'S TREATMENT:                                                                                                                              DATE:  02/08/22 Nustep 5' L3 average SPM over 85 UE/LE seat 10 Standing row BTB 2 x 15  shoulder extension BTB 2 x 15 Palof press BTB 2 x 15 bilateral Alternating march intermittent 1 UE 20x 3" Hip vectors 10X5" each with 1 UE assist Tandem stance 10x STS eccentric control  01/30/22 Standing row BTB 2 x 15  Standing shoulder extension BTB 2 x 15 Palof press BTB 2 x 15 bilateral Lift BTB 2 x 10 each bilateral Step up 6 inch 1 x 10 2 HHA, 2x 10 1 HHA  01/25/22:   Nustep 5x L3 average SPM 73  while answering FOTO questions FOTO 55.9% 2MWT 379ft with SPC 5STS 01/25/22: 12.66" MMT- see above  Standing: Marching alternating: 20x Tandem stance 3x30"  01/19/2022 Seated  Cervical retraction 2 x 10 Shoulder bilateral external rotation with BTB 2 x 10  Sit to stand x 10 no UE assist  Standing: Tandem stance x 20" each Hip vectors 3" hold x 8 each BTB scapular retractions 2 x 10 BTB shoulder extensions 2 x 10    01/17/22 Alternating march intermittent 1 UE 10x 3" BTB shoulder extension 15x 5" BTB row 15x 5" BTB palloff 10x 2 sets Minisquats  10x STS eccentric control  03/06/21 Seated cervical retraction 2 x 10  Standing hip abduction GTB at knees 2 x 10 bilateral  Row GTB 2 x 10  Shoulder extension GTB 2 x 10  Palof press 2x10 GTB   PATIENT EDUCATION:  Education details: 12/18/21: HEP;  EVAL: Patient educated on exam findings, POC, scope of PT, HEP, and posture. Person educated: Patient Education method: Explanation, Demonstration, and Handouts Education comprehension: verbalized understanding, returned demonstration, verbal cues required, and tactile cues required  HOME EXERCISE PROGRAM: Access Code: [email or web address]  01/30/22- Reverse Chop with Resistance  - 1 x daily - 7 x weekly - 2 sets - 10 reps  01/19/2022 resisted shoulder external rotation with BTB  Date: 11/27/2021 - Sit to Stand  - 2 x daily - 7 x weekly - 2 sets - 10 reps - Standing Hip Abduction with Counter Support  - 2 x daily - 7 x weekly - 2 sets - 10 reps  Date: 12/13/2021 - Sit to Stand  - 2 x daily - 7 x weekly - 2 sets - 10 reps - Standing Hip Abduction with Counter Support  - 2 x daily - 7 x weekly - 2 sets - 10 reps - Correct Seated Posture  - 5 x daily - 7 x weekly - 1 sets - 2 reps - Seated Scapular Retraction  - 1 x daily - 7 x weekly - 3 sets - 10 reps - 5" hold - Supine Chin Tuck  - 1 x daily - 7 x weekly - 2 sets - 10 reps - 5" hold - Supine Bridge  - 1 x daily - 7 x weekly - 3 sets - 10  reps - 5" hold  12/15/21: log rolling, LTR, decompression  01/03/22 - Standing Shoulder Row with Anchored Resistance  - 1 x daily - 7 x weekly - 2 sets - 10 reps - Shoulder extension with resistance - Neutral  - 1 x daily - 7 x weekly - 2 sets - 10 reps - Standing Anti-Rotation Press with Anchored Resistance  - 1 x daily - 7 x weekly - 2 sets - 10 reps  ASSESSMENT:  CLINICAL IMPRESSION: Continued with LE and postural strengthening.  Resumed static balance challenges with 80% use of 1 UE with Lt to stabilize and 100% on Rt due to regain, establish balance.  Cues needed for general form and posturing.  Pt required 2 seated rest breaks during session due to fatigue. Noted challenge and difficulty with activities today. Patient will continue to benefit from physical therapy in order to improve function and reduce impairment.   OBJECTIVE IMPAIRMENTS: Abnormal gait, decreased activity tolerance, decreased balance, decreased endurance, decreased mobility, difficulty walking, decreased ROM, decreased strength, hypomobility, increased muscle spasms, impaired flexibility, impaired sensation, improper body mechanics, postural dysfunction, and pain.   ACTIVITY LIMITATIONS: carrying, lifting, bending, sitting, standing, squatting, sleeping, stairs, transfers, reach over head, hygiene/grooming, locomotion level, and caring for others  PARTICIPATION LIMITATIONS: meal prep, cleaning, laundry, shopping, community activity, and yard work  PERSONAL FACTORS: Fitness, Time since onset of injury/illness/exacerbation, and 3+ comorbidities: DM, chronic pain, hx foot ulcer/amputation, sedentary, tobacco use  are also affecting patient's functional outcome.   REHAB POTENTIAL: Good  CLINICAL DECISION MAKING: Stable/uncomplicated  EVALUATION COMPLEXITY: Low    GOALS: Goals reviewed with patient? Yes  SHORT TERM GOALS: Target date: 12/18/2021  Patient will be independent with HEP in order to improve functional  outcomes. Baseline:  Goal status: MET  2.  Patient will report at least 25% improvement in symptoms for improved quality of life.  Baseline: Goal status: MET    LONG TERM GOALS: Target date: 12/25/2021  Patient will report at least 75% improvement in symptoms for improved quality of life. Baseline:  01/25/22:  feels improvements 60% Goal status: IN PROGRESS  2.  Patient will improve FOTO score by at least 15 points in order to indicate improved tolerance to activity. Baseline: 40% function 12/26/21 63 % function; 01/25/22: 56% function Goal status: MET  3.  Patient will demonstrate at least 25% improvement in lumbar ROM in all restricted planes for improved ability to move trunk while completing chores. Baseline: see above Goal status: MET  4.  Patient will be able to ambulate at least 425 feet in 2MWT in order to demonstrate improved tolerance to activity. Baseline: 375 feet with SPC 12/26/21: 405 feet with SPC; 01/25/22: 2MWT 384ft with SPC Goal status: IN PROGRESS  5.  Patient will be able to complete 5x STS in under 14 seconds in order to reduce the risk of falls. Baseline: 22.61 seconds without UE use  12/26/21 14.09 seconds without UE use 01/25/22: 12.66" without UE A Goal status: MET  6.  Patient will demonstrate grade of 5/5 MMT grade in all tested musculature as evidence of improved strength to assist with stair ambulation and gait.   Baseline: see above Goal status: IN PROGRESS   PLAN:  PT FREQUENCY: 2x/week  PT DURATION: 3 weeks  PLANNED INTERVENTIONS: Therapeutic exercises, Therapeutic activity, Neuromuscular re-education, Balance training, Gait training, Patient/Family education, Joint manipulation, Joint mobilization, Stair training, Orthotic/Fit training, DME instructions, Aquatic Therapy, Dry Needling, Electrical stimulation, Spinal manipulation, Spinal mobilization, Cryotherapy, Moist heat, Compression bandaging, scar mobilization, Splintting, Taping, Traction,  Ultrasound, Ionotophoresis 4mg /ml Dexamethasone, and Manual therapy   PLAN FOR NEXT SESSION:  postural strength, core and glute strength. Progress standing balance activities, core and posture strengthening.   1:45 PM, 02/08/22 Teena Irani, PTA/CLT [place] Ph: [phone]

## 2022-02-12 ENCOUNTER — Other Ambulatory Visit: Payer: Self-pay | Admitting: Otolaryngology

## 2022-02-12 DIAGNOSIS — F17210 Nicotine dependence, cigarettes, uncomplicated: Secondary | ICD-10-CM | POA: Diagnosis not present

## 2022-02-12 DIAGNOSIS — R9389 Abnormal findings on diagnostic imaging of other specified body structures: Secondary | ICD-10-CM | POA: Diagnosis not present

## 2022-02-12 DIAGNOSIS — J387 Other diseases of larynx: Secondary | ICD-10-CM

## 2022-02-14 ENCOUNTER — Encounter (HOSPITAL_COMMUNITY): Payer: Self-pay | Admitting: Physical Therapy

## 2022-02-14 ENCOUNTER — Ambulatory Visit (HOSPITAL_COMMUNITY): Payer: No Typology Code available for payment source | Admitting: Physical Therapy

## 2022-02-14 DIAGNOSIS — M5459 Other low back pain: Secondary | ICD-10-CM | POA: Diagnosis not present

## 2022-02-14 NOTE — Therapy (Signed)
OUTPATIENT PHYSICAL THERAPY THORACOLUMBAR TREATMENT   Patient Name: Mitchell [name] MRN: [id number] DOB:20-Apr-1950, 72 y.o., male Today's Date: 02/14/2022  PHYSICAL THERAPY DISCHARGE SUMMARY  Visits from Start of Care: 13  Current functional level related to goals / functional outcomes: See below   Remaining deficits: See below   Education / Equipment: See assessment    Patient agrees to discharge. Patient goals were partially met. Patient is being discharged due to maximized rehab potential.     PT End of Session - 02/14/22 1653     Visit Number 13    Number of Visits 16    Date for PT Re-Evaluation 02/16/22    Authorization Type [employer]  (no vl, no auth)    Progress Note Due on Visit 20    PT Start Time 1650    PT Stop Time 1728    PT Time Calculation (min) 38 min    Activity Tolerance Patient tolerated treatment well;No increased pain    Behavior During Therapy [facility] for tasks assessed/performed               Past Medical History:  Diagnosis Date   Arthritis    Cellulitis of right lower extremity 05/09/2014   Diabetes mellitus without complication (HCC)    Diabetic foot ulcer (HCC) 07/07/2014   Status post bedside debridement of multiple right plantar ulcers by podiatrist, Dr. Reynolds Bowl.   Hypercholesterolemia    IDA (iron deficiency anemia)    Maggot infestation    right foot ulcer   Obesity 05/09/2014   Tobacco abuse 07/07/2014   Past Surgical History:  Procedure Laterality Date   APPENDECTOMY     APPLICATION OF WOUND VAC Right 10/27/2020   Procedure: APPLICATION OF WOUND VAC;  Surgeon: Nada Libman, MD;  Location: [facility] OR;  Service: Vascular;  Laterality: Right;   ESOPHAGOGASTRODUODENOSCOPY (EGD) WITH PROPOFOL N/A 10/28/2020   Procedure: ESOPHAGOGASTRODUODENOSCOPY (EGD) WITH PROPOFOL;  Surgeon: [name], MD;  Location: [facility] ENDOSCOPY;  Service: Gastroenterology;  Laterality: N/A;   HEMOSTASIS CLIP PLACEMENT  10/28/2020   Procedure: HEMOSTASIS  CLIP PLACEMENT;  Surgeon: [name], MD;  Location: [facility] ENDOSCOPY;  Service: Gastroenterology;;   HOT HEMOSTASIS N/A 10/28/2020   Procedure: HOT HEMOSTASIS (ARGON PLASMA COAGULATION/BICAP);  Surgeon: [name], MD;  Location: [facility] ENDOSCOPY;  Service: Gastroenterology;  Laterality: N/A;   PERIPHERAL VASCULAR CATHETERIZATION N/A 07/22/2015   Procedure: Abdominal Aortogram w/Lower Extremity;  Surgeon: Sherren Kerns, MD;  Location: [facility] INVASIVE CV LAB;  Service: Cardiovascular;  Laterality: N/A;   TRANSMETATARSAL AMPUTATION Right 10/27/2020   Procedure: TRANSMETATARSAL AMPUTATION;  Surgeon: Nada Libman, MD;  Location: [facility] OR;  Service: Vascular;  Laterality: Right;   Patient Active Problem List   Diagnosis Date Noted   Foot osteomyelitis, right (HCC) 10/25/2020   GI bleed 10/25/2020   Blood loss anemia 10/25/2020   Osteomyelitis of right foot (HCC) 10/25/2020   Closed fracture of proximal end of right humerus with routine healing 02/25/2019   Pre-ulcerative calluses 04/20/2018   Myositis 07/08/2014   Diabetic foot ulcer associated with type 2 diabetes mellitus (HCC) 07/07/2014   Tobacco abuse 07/07/2014   Diabetic ulcer of right foot (HCC) 05/12/2014   Pulse weakness    Peripheral neuropathy    Cellulitis of right lower extremity 05/09/2014   Obesity 05/09/2014   Cellulitis of right lower leg 05/09/2014   Hyperglycemia 03/21/2013   Type 2 diabetes mellitus with foot ulcer (HCC) 03/21/2013   Noncompliance with medications due to cost  issues 03/21/2013    PCP: Monico Blitz, MD  REFERRING PROVIDER: Monico Blitz, MD  REFERRING DIAG: LBP  Rationale for Evaluation and Treatment: Rehabilitation  THERAPY DIAG:  Other low back pain  ONSET DATE: 1 year  SUBJECTIVE:                                                                                                                                                                                           SUBJECTIVE  STATEMENT: Patient feels he has made some progress with his back. He has some other medical issues going on and feel that therapy takes his mind off of it.    Eval subjective: Patient states back has been hurting since he was in the hospital for his foot. His back bothers him with bending, chores, getting out of bed and more. Back is rough with trying to do stuff.   PERTINENT HISTORY:  DM, chronic pain, hx foot ulcer/amputation  PAIN:  Are you having pain? Yes: NPRS scale: 1/10 Pain location: low back and BLE Pain description: achey in back and tight BLE Aggravating factors: chores, bending, movement  Relieving factors: none  PRECAUTIONS: None  WEIGHT BEARING RESTRICTIONS: No  FALLS:  Has patient fallen in last 6 months? No  LIVING ENVIRONMENT: Lives with: lives alone Lives in: House/apartment Stairs:  ramp Has following equipment at home: Single point cane  OCCUPATION: Retired  PLOF: Independent  PATIENT GOALS: decrease back pain  NEXT MD VISIT: not sure  OBJECTIVE:   PATIENT SURVEYS:  FOTO 70% (was 40% function)  SCREENING FOR RED FLAGS: Bowel or bladder incontinence: No Spinal tumors: No Cauda equina syndrome: No Compression fracture: No Abdominal aneurysm: No  COGNITION: Overall cognitive status: Within functional limits for tasks assessed     SENSATION: WFL   POSTURE: rounded shoulders, forward head, decreased lumbar lordosis, and increased thoracic kyphosis  PALPATION: TTP lumbar paraspinals   LUMBAR ROM:   AROM eval 12/26/21 02/14/22  Flexion 0% limited 0% limited   Extension 90% limited 75% limited   Right lateral flexion 50% limited 25% limited   Left lateral flexion 50% limited 25% limited   Right rotation 50% limited 25% limited   Left rotation 50% limited 25% limited    (Blank rows = not tested)  LOWER EXTREMITY ROM:   WFL for tasks assessed    LOWER EXTREMITY MMT:    MMT Right eval Left eval Right 12/26/21 Left 12/26/21  Right 01/25/22 Left  01/25/22 Right 02/14/22 Left 02/14/22  Hip flexion 4+ 4+ 5 5   4+ 4+  Hip extension 3+ 4- 4+ 4+ 4+/5 4+/5 4+ 4+  Hip  abduction 3+ 4- 4+ 5 5/5 5/5 4+ 4+  Hip adduction          Hip internal rotation          Hip external rotation          Knee flexion 4+ 5 5 5   5 5   Knee extension 4+ 5 5 5   5 5   Ankle dorsiflexion          Ankle plantarflexion          Ankle inversion          Ankle eversion           (Blank rows = not tested)    FUNCTIONAL TESTS:  5 times sit to stand: 13 sec with no UE (was 22.61 seconds without UE use) 2 minute walk test:  385 feet with SPC (was 375 feet with SPC) Transfer STS: labored, requires bilateral UE support  GAIT: Distance walked: 385 feet using SPC (was 375 feet) Assistive device utilized: Single point cane Level of assistance: Modified independence Comments: , gradually increasing trunk flexion, decreased L arm swing, Trendelenburg on L   Reassessment 12/26/21 FUNCTIONAL TESTS:  5 times sit to stand: 14.09 seconds without UE use 2 minute walk test: 405 feet with SPC Transfer STS: labored, requires bilateral UE support  GAIT: Distance walked: 405 feet Assistive device utilized: Single point cane Level of assistance: Modified independence Comments: , gradually increasing trunk flexion, decreased foot clearance bilaterally   TODAY'S TREATMENT:                                                                                                                              DATE:  02/14/22 Reassess FOTO MMT STS x 5  2 MWT   02/08/22 Nustep 5' L3 average SPM over 85 UE/LE seat 10 Standing row BTB 2 x 15  shoulder extension BTB 2 x 15 Palof press BTB 2 x 15 bilateral Alternating march intermittent 1 UE 20x 3" Hip vectors 10X5" each with 1 UE assist Tandem stance 10x STS eccentric control  01/30/22 Standing row BTB 2 x 15  Standing shoulder extension BTB 2 x 15 Palof press BTB 2 x 15 bilateral Lift BTB 2 x 10  each bilateral Step up 6 inch 1 x 10 2 HHA, 2x 10 1 HHA    PATIENT EDUCATION:  Education details: 12/18/21: HEP;  EVAL: Patient educated on exam findings, POC, scope of PT, HEP, and posture. Person educated: Patient Education method: Explanation, Demonstration, and Handouts Education comprehension: verbalized understanding, returned demonstration, verbal cues required, and tactile cues required  HOME EXERCISE PROGRAM: Access Code: [email or web address]  01/30/22- Reverse Chop with Resistance  - 1 x daily - 7 x weekly - 2 sets - 10 reps  01/19/2022 resisted shoulder external rotation with BTB  Date: 11/27/2021 - Sit to Stand  - 2 x daily - 7 x weekly - 2 sets - 10 reps - Standing Hip  Abduction with Counter Support  - 2 x daily - 7 x weekly - 2 sets - 10 reps  Date: 12/13/2021 - Sit to Stand  - 2 x daily - 7 x weekly - 2 sets - 10 reps - Standing Hip Abduction with Counter Support  - 2 x daily - 7 x weekly - 2 sets - 10 reps - Correct Seated Posture  - 5 x daily - 7 x weekly - 1 sets - 2 reps - Seated Scapular Retraction  - 1 x daily - 7 x weekly - 3 sets - 10 reps - 5" hold - Supine Chin Tuck  - 1 x daily - 7 x weekly - 2 sets - 10 reps - 5" hold - Supine Bridge  - 1 x daily - 7 x weekly - 3 sets - 10 reps - 5" hold  12/15/21: log rolling, LTR, decompression  01/03/22 - Standing Shoulder Row with Anchored Resistance  - 1 x daily - 7 x weekly - 2 sets - 10 reps - Shoulder extension with resistance - Neutral  - 1 x daily - 7 x weekly - 2 sets - 10 reps - Standing Anti-Rotation Press with Anchored Resistance  - 1 x daily - 7 x weekly - 2 sets - 10 reps  ASSESSMENT:  CLINICAL IMPRESSION: Patient has made good progress to therapy goals. He has met all therapy goals except 2 MWT, but his gait speed is functional and above cut off for increased risk of falls. Discussed reassessment findings with patient and plan to transition to independent with home program. Patient agrees with this plan. Reviewed HEP  and answered all patient questions. Patient encouraged to follow up with therapy services with nay further questions or concerns.   OBJECTIVE IMPAIRMENTS: Abnormal gait, decreased activity tolerance, decreased balance, decreased endurance, decreased mobility, difficulty walking, decreased ROM, decreased strength, hypomobility, increased muscle spasms, impaired flexibility, impaired sensation, improper body mechanics, postural dysfunction, and pain.   ACTIVITY LIMITATIONS: carrying, lifting, bending, sitting, standing, squatting, sleeping, stairs, transfers, reach over head, hygiene/grooming, locomotion level, and caring for others  PARTICIPATION LIMITATIONS: meal prep, cleaning, laundry, shopping, community activity, and yard work  PERSONAL FACTORS: Fitness, Time since onset of injury/illness/exacerbation, and 3+ comorbidities: DM, chronic pain, hx foot ulcer/amputation, sedentary, tobacco use  are also affecting patient's functional outcome.   REHAB POTENTIAL: Good  CLINICAL DECISION MAKING: Stable/uncomplicated  EVALUATION COMPLEXITY: Low    GOALS: Goals reviewed with patient? Yes  SHORT TERM GOALS: Target date: 12/18/2021  Patient will be independent with HEP in order to improve functional outcomes. Baseline:  Goal status: MET  2.  Patient will report at least 25% improvement in symptoms for improved quality of life. Baseline: Goal status: MET    LONG TERM GOALS: Target date: 12/25/2021  Patient will report at least 75% improvement in symptoms for improved quality of life. Baseline:  75-80%  Goal status: MET  2.  Patient will improve FOTO score by at least 15 points in order to indicate improved tolerance to activity. Baseline: 70%  Goal status: MET  3.  Patient will demonstrate at least 25% improvement in lumbar ROM in all restricted planes for improved ability to move trunk while completing chores. Baseline: see above Goal status: MET  4.  Patient will be able to  ambulate at least 425 feet in 2MWT in order to demonstrate improved tolerance to activity. Baseline: 385 feet using SPC  Goal status: NOT MET  5.  Patient will be able to  complete 5x STS in under 14 seconds in order to reduce the risk of falls. Baseline: 13 sec with no UE Goal status: MET  6.  Patient will demonstrate grade of 5/5 MMT grade in all tested musculature as evidence of improved strength to assist with stair ambulation and gait.   Baseline: see above Goal status: Partially MET   PLAN:  PT FREQUENCY: 2x/week  PT DURATION: 3 weeks  PLANNED INTERVENTIONS: Therapeutic exercises, Therapeutic activity, Neuromuscular re-education, Balance training, Gait training, Patient/Family education, Joint manipulation, Joint mobilization, Stair training, Orthotic/Fit training, DME instructions, Aquatic Therapy, Dry Needling, Electrical stimulation, Spinal manipulation, Spinal mobilization, Cryotherapy, Moist heat, Compression bandaging, scar mobilization, Splintting, Taping, Traction, Ultrasound, Ionotophoresis 4mg /ml Dexamethasone, and Manual therapy   PLAN FOR NEXT SESSION: DC to HEP   5:28 PM, 02/14/22 Josue Hector PT DPT  Physical Therapist with [facility]  [phone]

## 2022-02-15 ENCOUNTER — Encounter (HOSPITAL_COMMUNITY): Payer: No Typology Code available for payment source | Admitting: Physical Therapy

## 2022-02-19 ENCOUNTER — Other Ambulatory Visit: Payer: Self-pay | Admitting: Otolaryngology

## 2022-02-20 DIAGNOSIS — E1165 Type 2 diabetes mellitus with hyperglycemia: Secondary | ICD-10-CM | POA: Diagnosis not present

## 2022-02-20 DIAGNOSIS — Z299 Encounter for prophylactic measures, unspecified: Secondary | ICD-10-CM | POA: Diagnosis not present

## 2022-02-20 DIAGNOSIS — F17210 Nicotine dependence, cigarettes, uncomplicated: Secondary | ICD-10-CM | POA: Diagnosis not present

## 2022-02-20 DIAGNOSIS — I700 Atherosclerosis of aorta: Secondary | ICD-10-CM | POA: Diagnosis not present

## 2022-02-20 DIAGNOSIS — I10 Essential (primary) hypertension: Secondary | ICD-10-CM | POA: Diagnosis not present

## 2022-02-20 DIAGNOSIS — C140 Malignant neoplasm of pharynx, unspecified: Secondary | ICD-10-CM | POA: Diagnosis not present

## 2022-02-21 ENCOUNTER — Encounter (HOSPITAL_COMMUNITY): Payer: No Typology Code available for payment source | Admitting: Physical Therapy

## 2022-03-01 ENCOUNTER — Other Ambulatory Visit: Payer: Self-pay

## 2022-03-01 ENCOUNTER — Encounter (HOSPITAL_COMMUNITY): Payer: Self-pay | Admitting: Otolaryngology

## 2022-03-01 NOTE — Progress Notes (Signed)
PCP - Dr. Manuella Ghazi, Ashish Cardiologist - denies  PPM/ICD - denies  Chest x-ray - n/a EKG - 03/02/22  CPAP - n/a  Fasting Blood Sugar - 100 - 120 Checks Blood Sugar - 2-3/day Ozempic - last dose - 02/23/22 per patient  Blood Thinner Instructions: n/a Aspirin Instructions - last dose - 03/01/22 per patient Patient was instructed: As of today, STOP taking any Aspirin (unless otherwise instructed by your surgeon) Aleve, Naproxen, Ibuprofen, Motrin, Advil, Goody's, BC's, all herbal medications, fish oil, and all vitamins.  ERAS Protcol - yes, until 04:30 o'clock  COVID TEST- yes, the test will be done day of surgery  Anesthesia review: no  Patient verbally denies any shortness of breath, fever, cough and chest pain during phone call   -------------  SDW INSTRUCTIONS given:  Your procedure is scheduled on Friday, February 16th, 2024.  Report to [facility] Main Entrance "A" at 05:30 A.M., and check in at the Admitting office.  Call this number if you have problems the morning of surgery:  [phone]   Remember:  Do not eat after midnight the night before your surgery  You may drink clear liquids until 04:30 the morning of your surgery.   Clear liquids allowed are: Water, Non-Citrus Juices (without pulp), Carbonated Beverages, Clear Tea, Black Coffee Only, and Gatorade    Take these medicines the morning of surgery with A SIP OF WATER: Gabapentin, Crestor  Hold Jardiance 72 hrs prior surgery   THE NIGHT BEFORE SURGERY, take 10 units of Tresiba       THE MORNING OF SURGERY, take 10 units of Tresiba.  The day of surgery, hold Novolog  If your CBG is greater than 220 mg/dL, you may take  of your sliding scale (correction) dose of insulin.   How do I manage my blood sugar before surgery? Check your blood sugar at least 4 times a day, starting 2 days before surgery, to make sure that the level is not too high or low.  Check your blood sugar the morning of your surgery  when you wake up and every 2 hours until you get to the Short Stay unit.  If your blood sugar is less than 70 mg/dL, you will need to treat for low blood sugar: Do not take insulin. Treat a low blood sugar (less than 70 mg/dL) with  cup of clear juice (cranberry or apple), 4 glucose tablets, OR glucose gel. Recheck blood sugar in 15 minutes after treatment (to make sure it is greater than 70 mg/dL). If your blood sugar is not greater than 70 mg/dL on recheck, call [phone] for further instructions. Report your blood sugar to the short stay nurse when you get to Short Stay.   The day of surgery:                     Do not wear jewelry,             Do not wear lotions, powders, colognes, or deodorant.            Men may shave face and neck.            Do not bring valuables to the hospital.            [facility] is not responsible for any belongings or valuables.  Do NOT Smoke (Tobacco/Vaping) 24 hours prior to your procedure If you use a CPAP at night, you may bring all equipment for your overnight stay.  Contacts, glasses, dentures or bridgework may not be worn into surgery.      For patients admitted to the hospital, discharge time will be determined by your treatment team.   Patients discharged the day of surgery will not be allowed to drive home, and someone needs to stay with them for 24 hours.    Special instructions:   [facility]- Preparing For Surgery  Before surgery, you can play an important role. Because skin is not sterile, your skin needs to be as free of germs as possible. You can reduce the number of germs on your skin by washing with CHG (chlorahexidine gluconate) Soap before surgery.  CHG is an antiseptic cleaner which kills germs and bonds with the skin to continue killing germs even after washing.    Oral Hygiene is also important to reduce your risk of infection.  Remember - BRUSH YOUR TEETH THE MORNING OF SURGERY WITH YOUR REGULAR TOOTHPASTE  Please do  not use if you have an allergy to CHG or antibacterial soaps. If your skin becomes reddened/irritated stop using the CHG.  Do not shave (including legs and underarms) for at least 48 hours prior to first CHG shower. It is OK to shave your face.  Please follow these instructions carefully.   Shower the NIGHT BEFORE SURGERY and the MORNING OF SURGERY with DIAL Soap.   Pat yourself dry with a CLEAN TOWEL.  Wear CLEAN PAJAMAS to bed the night before surgery  Place CLEAN SHEETS on your bed the night of your first shower and DO NOT SLEEP WITH PETS.   Day of Surgery: Please shower morning of surgery  Wear Clean/Comfortable clothing the morning of surgery Do not apply any deodorants/lotions.   Remember to brush your teeth WITH YOUR REGULAR TOOTHPASTE.   Questions were answered. Patient verbalized understanding of instructions.

## 2022-03-02 ENCOUNTER — Encounter (HOSPITAL_COMMUNITY): Payer: Self-pay | Admitting: Otolaryngology

## 2022-03-02 ENCOUNTER — Ambulatory Visit (HOSPITAL_BASED_OUTPATIENT_CLINIC_OR_DEPARTMENT_OTHER): Payer: No Typology Code available for payment source | Admitting: Certified Registered Nurse Anesthetist

## 2022-03-02 ENCOUNTER — Ambulatory Visit (HOSPITAL_COMMUNITY): Payer: No Typology Code available for payment source | Admitting: Certified Registered Nurse Anesthetist

## 2022-03-02 ENCOUNTER — Encounter (HOSPITAL_COMMUNITY)
Admission: RE | Disposition: A | Discharge status: Home / Self Care | Payer: Self-pay | Source: Home / Self Care | Attending: Otolaryngology

## 2022-03-02 ENCOUNTER — Ambulatory Visit (HOSPITAL_COMMUNITY)
Admission: RE | Admit: 2022-03-02 | Discharge: 2022-03-02 | Disposition: A | Discharge status: Home / Self Care | Payer: No Typology Code available for payment source | Attending: Otolaryngology | Admitting: Otolaryngology

## 2022-03-02 DIAGNOSIS — Z7984 Long term (current) use of oral hypoglycemic drugs: Secondary | ICD-10-CM

## 2022-03-02 DIAGNOSIS — Z7985 Long-term (current) use of injectable non-insulin antidiabetic drugs: Secondary | ICD-10-CM | POA: Insufficient documentation

## 2022-03-02 DIAGNOSIS — Z1152 Encounter for screening for COVID-19: Secondary | ICD-10-CM | POA: Insufficient documentation

## 2022-03-02 DIAGNOSIS — J381 Polyp of vocal cord and larynx: Secondary | ICD-10-CM | POA: Insufficient documentation

## 2022-03-02 DIAGNOSIS — J45909 Unspecified asthma, uncomplicated: Secondary | ICD-10-CM

## 2022-03-02 DIAGNOSIS — F17200 Nicotine dependence, unspecified, uncomplicated: Secondary | ICD-10-CM | POA: Diagnosis not present

## 2022-03-02 DIAGNOSIS — F17210 Nicotine dependence, cigarettes, uncomplicated: Secondary | ICD-10-CM

## 2022-03-02 DIAGNOSIS — J387 Other diseases of larynx: Secondary | ICD-10-CM | POA: Diagnosis not present

## 2022-03-02 DIAGNOSIS — Z794 Long term (current) use of insulin: Secondary | ICD-10-CM | POA: Diagnosis not present

## 2022-03-02 DIAGNOSIS — E119 Type 2 diabetes mellitus without complications: Secondary | ICD-10-CM | POA: Insufficient documentation

## 2022-03-02 DIAGNOSIS — E1149 Type 2 diabetes mellitus with other diabetic neurological complication: Secondary | ICD-10-CM | POA: Diagnosis not present

## 2022-03-02 HISTORY — PX: MICROLARYNGOSCOPY: SHX5208

## 2022-03-02 HISTORY — DX: Pneumonia, unspecified organism: J18.9

## 2022-03-02 HISTORY — DX: Unspecified asthma, uncomplicated: J45.909

## 2022-03-02 LAB — CBC
HCT: 52.3 % — ABNORMAL HIGH (ref 39.0–52.0)
Hemoglobin: 17 g/dL (ref 13.0–17.0)
MCH: 29.6 pg (ref 26.0–34.0)
MCHC: 32.5 g/dL (ref 30.0–36.0)
MCV: 91.1 fL (ref 80.0–100.0)
Platelets: 171 10*3/uL (ref 150–400)
RBC: 5.74 MIL/uL (ref 4.22–5.81)
RDW: 13.4 % (ref 11.5–15.5)
WBC: 10.5 10*3/uL (ref 4.0–10.5)
nRBC: 0 % (ref 0.0–0.2)

## 2022-03-02 LAB — BASIC METABOLIC PANEL
Anion gap: 10 (ref 5–15)
BUN: 20 mg/dL (ref 8–23)
CO2: 31 mmol/L (ref 22–32)
Calcium: 9 mg/dL (ref 8.9–10.3)
Chloride: 99 mmol/L (ref 98–111)
Creatinine, Ser: 1.19 mg/dL (ref 0.61–1.24)
GFR, Estimated: >60 mL/min (ref >60)
Glucose, Bld: 158 mg/dL — ABNORMAL HIGH (ref 70–99)
Potassium: 4.6 mmol/L (ref 3.5–5.1)
Sodium: 140 mmol/L (ref 135–145)

## 2022-03-02 LAB — GLUCOSE, CAPILLARY
Glucose-Capillary: 169 mg/dL — ABNORMAL HIGH (ref 70–99)
Glucose-Capillary: 169 mg/dL — ABNORMAL HIGH (ref 70–99)

## 2022-03-02 LAB — SARS CORONAVIRUS 2 BY RT PCR: SARS Coronavirus 2 by RT PCR: NEGATIVE

## 2022-03-02 SURGERY — MICROLARYNGOSCOPY
Anesthesia: General | Site: Throat | Laterality: Bilateral

## 2022-03-02 MED ORDER — SUGAMMADEX SODIUM 200 MG/2ML IV SOLN
INTRAVENOUS | Status: DC | PRN
  Administered 2022-03-02 (×3): 100 mg via INTRAVENOUS

## 2022-03-02 MED ORDER — EPINEPHRINE HCL (NASAL) 0.1 % NA SOLN
NASAL | Status: AC
  Filled 2022-03-02: qty 30

## 2022-03-02 MED ORDER — DEXAMETHASONE SODIUM PHOSPHATE 10 MG/ML IJ SOLN
INTRAMUSCULAR | Status: DC | PRN
  Administered 2022-03-02: 10 mg via INTRAVENOUS

## 2022-03-02 MED ORDER — PHENYLEPHRINE 80 MCG/ML (10ML) SYRINGE FOR IV PUSH (FOR BLOOD PRESSURE SUPPORT)
PREFILLED_SYRINGE | INTRAVENOUS | Status: DC | PRN
  Administered 2022-03-02: 160 ug via INTRAVENOUS

## 2022-03-02 MED ORDER — ONDANSETRON HCL 4 MG/2ML IJ SOLN: 4.0000 mg | Freq: Once | INTRAMUSCULAR | Status: DC | PRN

## 2022-03-02 MED ORDER — CHLORHEXIDINE GLUCONATE 0.12 % MT SOLN: 15.0000 mL | Freq: Once | OROMUCOSAL | Status: AC

## 2022-03-02 MED ORDER — HYDROCODONE-ACETAMINOPHEN 5-325 MG PO TABS: 1.0000 | ORAL_TABLET | Freq: Three times a day (TID) | ORAL | 0 refills | Status: AC | PRN

## 2022-03-02 MED ORDER — VANCOMYCIN HCL IN DEXTROSE 1-5 GM/200ML-% IV SOLN
1000.0000 mg | INTRAVENOUS | Status: AC
Start: 2022-03-02 — End: 2022-03-02
  Administered 2022-03-02: 1000 mg via INTRAVENOUS
  Filled 2022-03-02: qty 200

## 2022-03-02 MED ORDER — CHLORHEXIDINE GLUCONATE 0.12 % MT SOLN
OROMUCOSAL | Status: AC
  Administered 2022-03-02: 15 mL via OROMUCOSAL
  Filled 2022-03-02: qty 15

## 2022-03-02 MED ORDER — INSULIN ASPART 100 UNIT/ML IJ SOLN: 0.0000 [IU] | INTRAMUSCULAR | Status: DC | PRN

## 2022-03-02 MED ORDER — LIDOCAINE 2% (20 MG/ML) 5 ML SYRINGE
INTRAMUSCULAR | Status: DC | PRN
  Administered 2022-03-02: 60 mg via INTRAVENOUS

## 2022-03-02 MED ORDER — ACETAMINOPHEN 10 MG/ML IV SOLN
INTRAVENOUS | Status: AC
  Filled 2022-03-02: qty 100

## 2022-03-02 MED ORDER — ORAL CARE MOUTH RINSE: 15.0000 mL | Freq: Once | OROMUCOSAL | Status: AC

## 2022-03-02 MED ORDER — ACETAMINOPHEN 10 MG/ML IV SOLN
1000.0000 mg | Freq: Once | INTRAVENOUS | Status: DC | PRN
  Administered 2022-03-02: 1000 mg via INTRAVENOUS

## 2022-03-02 MED ORDER — FENTANYL CITRATE (PF) 250 MCG/5ML IJ SOLN
INTRAMUSCULAR | Status: AC
  Filled 2022-03-02: qty 5

## 2022-03-02 MED ORDER — FENTANYL CITRATE (PF) 100 MCG/2ML IJ SOLN: 25.0000 ug | INTRAMUSCULAR | Status: DC | PRN

## 2022-03-02 MED ORDER — AMISULPRIDE (ANTIEMETIC) 5 MG/2ML IV SOLN: 10.0000 mg | Freq: Once | INTRAVENOUS | Status: DC | PRN

## 2022-03-02 MED ORDER — 0.9 % SODIUM CHLORIDE (POUR BTL) OPTIME
TOPICAL | Status: DC | PRN
  Administered 2022-03-02: 1000 mL

## 2022-03-02 MED ORDER — FENTANYL CITRATE (PF) 250 MCG/5ML IJ SOLN
INTRAMUSCULAR | Status: DC | PRN
  Administered 2022-03-02: 50 ug via INTRAVENOUS
  Administered 2022-03-02: 100 ug via INTRAVENOUS

## 2022-03-02 MED ORDER — ONDANSETRON HCL 4 MG/2ML IJ SOLN
INTRAMUSCULAR | Status: DC | PRN
  Administered 2022-03-02: 4 mg via INTRAVENOUS

## 2022-03-02 MED ORDER — LACTATED RINGERS IV SOLN: INTRAVENOUS | Status: DC

## 2022-03-02 MED ORDER — EPINEPHRINE PF 1 MG/ML IJ SOLN
INTRAMUSCULAR | Status: DC | PRN
  Administered 2022-03-02: 1 mg

## 2022-03-02 MED ORDER — PROPOFOL 10 MG/ML IV BOLUS
INTRAVENOUS | Status: AC
  Filled 2022-03-02: qty 20

## 2022-03-02 MED ORDER — ROCURONIUM BROMIDE 10 MG/ML (PF) SYRINGE
PREFILLED_SYRINGE | INTRAVENOUS | Status: DC | PRN
  Administered 2022-03-02: 60 mg via INTRAVENOUS

## 2022-03-02 MED ORDER — PROPOFOL 10 MG/ML IV BOLUS
INTRAVENOUS | Status: DC | PRN
  Administered 2022-03-02: 150 mg via INTRAVENOUS

## 2022-03-02 SURGICAL SUPPLY — 32 items
BAG COUNTER SPONGE SURGICOUNT (BAG) ×1 IMPLANT
BALLN PULM 12 13.5 15X75 (BALLOONS)
BALLN PULMONARY 10-12 (MISCELLANEOUS) IMPLANT
BALLOON PULM 12 13.5 15X75 (BALLOONS) IMPLANT
BLADE SURG 15 STRL LF DISP TIS (BLADE) IMPLANT
BLADE SURG 15 STRL SS (BLADE)
BNDG EYE OVAL (GAUZE/BANDAGES/DRESSINGS) ×2 IMPLANT
CANISTER SUCT 3000ML PPV (MISCELLANEOUS) ×1 IMPLANT
CNTNR URN SCR LID CUP LEK RST (MISCELLANEOUS) IMPLANT
CONT SPEC 4OZ STRL OR WHT (MISCELLANEOUS)
COVER BACK TABLE 60X90IN (DRAPES) ×1 IMPLANT
COVER MAYO STAND STRL (DRAPES) ×1 IMPLANT
DRAPE HALF SHEET 40X57 (DRAPES) ×1 IMPLANT
GAUZE SPONGE 4X4 12PLY STRL (GAUZE/BANDAGES/DRESSINGS) ×1 IMPLANT
GLOVE SURG ENC MOIS LTX SZ6.5 (GLOVE) ×1 IMPLANT
GOWN STRL REUS W/ TWL LRG LVL3 (GOWN DISPOSABLE) IMPLANT
GOWN STRL REUS W/TWL LRG LVL3 (GOWN DISPOSABLE)
KIT BASIN OR (CUSTOM PROCEDURE TRAY) ×1 IMPLANT
KIT TURNOVER KIT B (KITS) ×1 IMPLANT
NDL HYPO 25GX1X1/2 BEV (NEEDLE) IMPLANT
NEEDLE HYPO 25GX1X1/2 BEV (NEEDLE) IMPLANT
NS IRRIG 1000ML POUR BTL (IV SOLUTION) ×1 IMPLANT
PAD ARMBOARD 7.5X6 YLW CONV (MISCELLANEOUS) ×2 IMPLANT
PATTIES SURGICAL .5 X1 (DISPOSABLE) ×1 IMPLANT
PATTIES SURGICAL .5 X3 (DISPOSABLE) ×1 IMPLANT
POSITIONER HEAD DONUT 9IN (MISCELLANEOUS) IMPLANT
SOL ANTI FOG 6CC (MISCELLANEOUS) ×1 IMPLANT
SURGILUBE 2OZ TUBE FLIPTOP (MISCELLANEOUS) IMPLANT
SUT SILK 2 0 PERMA HAND 18 BK (SUTURE) IMPLANT
TOWEL GREEN STERILE FF (TOWEL DISPOSABLE) ×1 IMPLANT
TUBE CONNECTING 12X1/4 (SUCTIONS) ×1 IMPLANT
WATER STERILE IRR 1000ML POUR (IV SOLUTION) ×1 IMPLANT

## 2022-03-02 NOTE — Anesthesia Procedure Notes (Signed)
Procedure Name: Intubation Date/Time: 03/02/2022 7:43 AM  Performed by: Bryson Corona, CRNAPre-anesthesia Checklist: Patient identified, Emergency Drugs available, Suction available and Patient being monitored Patient Re-evaluated:Patient Re-evaluated prior to induction Oxygen Delivery Method: Circle System Utilized Preoxygenation: Pre-oxygenation with 100% oxygen Induction Type: IV induction Ventilation: Mask ventilation without difficulty Laryngoscope Size: Glidescope and 3 Grade View: Grade I Tube type: Oral Tube size: 6.0 mm Number of attempts: 1 Airway Equipment and Method: Stylet and Oral airway Placement Confirmation: ETT inserted through vocal cords under direct vision, positive ETCO2 and breath sounds checked- equal and bilateral Secured at: 23 cm Tube secured with: Tape Dental Injury: Teeth and Oropharynx as per pre-operative assessment  Comments: 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

## 2022-03-02 NOTE — Discharge Instructions (Addendum)
  [facility] POST OP INSTRUCTIONS: LARYNGOSCOPY  The Surgery Itself Laryngoscopy with biopsy or injection involves a brief general anesthesia, typically for  less than one hour. Patients may be sedated for several hours after surgery and may  remain sleepy for the better part of the day. Nausea and vomiting are occasionally seen,  and usually resolve by the evening of surgery - even without additional medications.  Almost all patients can go home the day of surgery.  After Surgery ? You will have a sore throat from the metal instruments used to allow a good view  of your voice box for 3-5 days after surgery. Some patients may have sores on  the tongue or in the mouth. This is normal and you will be given pain  medications for this. If you have sores in the mouth, avoid citrus or acidic  foods/drinks since they will burn.  ? Avoid coughing or frequent throat clearing. Drinking water can help alleviate the  urge to clear the throat. ? Avoid any heavy lifting (more than 20 lbs), straining, exercise, or sports activities  for 2 weeks after surgery. ? Avoid alcohol, tobacco products, spicy foods, or eating late at night as these may  cause heartburn or stomach reflux and may delay the healing process. ? Your voice may be hoarse after surgery from swelling of the vocal cords caused  by manipulation. ? You do not have to avoid talking unless your surgeon gives you specific  instructions. Use your normal voice since whispering is harder on your vocal  cords then talking at a regular volume. ? If your surgeon advises voice rest, you should do the following: o You are to have absolute voice rest for 7 days. You may want to purchase  a dry erase board to communicate during this time. After that, you may  begin to use your voice at a soft spoken level. o You should only speak loud enough for people to hear you that are within  an arm's length away. Do not yell or whisper. You may increase your   voice use by 5 minutes/hour each day after the first 7 days of rest.  Medications ? Pain medication can be used for pain as prescribed. Pain in the throat and the  tongue is normal. ? Some patients will be given steroids or acid reducing medications after surgery,  take these as directed. ? Take all of your routine medications as prescribed, unless told otherwise by your  surgeon. Any medications that thin the blood should be avoided unless approved  by your surgeon.  ? IT IS OK TO TAKE OVER THE COUNTER PAIN MEDICATION  (IBUPROFEN, NAPROXEN, or ACETAMINOPHEN) IN ADDITION TO  YOUR PRESCRIBED MEDICATIONS. DO NOT TAKE ASPIRIN UNLESS  CLEARED WITH YOUR SURGEON.  ? Limit Acetaminophen/Tylenol to less than 4,000mg /day  ? Limit Ibuprofen/Motrin to less than 3,600mg /day  Final Result ? Following vocal cord injection, the voice may seem "strangled" due to swelling for  a few days or weeks. This is normal.  ? If you have a biopsy in surgery, you will usually find out the pathology results  when you are seen in the office for follow up. ? Many patients benefit from voice therapy after surgery to improve the long term  result. Your surgeon will recommend this if you could benefit from it

## 2022-03-02 NOTE — Op Note (Signed)
OPERATIVE NOTE  Mitchell [name] Date/Time of Admission: 03/02/2022  5:04 AM  CSN: [id number] Attending Provider: Ebbie Latus A, DO Room/Bed: MCPO/NONE DOB: 25-Sep-1950 Age: 72 y.o.   Pre-Op Diagnosis: Mass of larynx  Post-Op Diagnosis: Mass of larynx  Procedure: Procedure(s): MICROLARYNGOSCOPY WITH EXCISION OF LESION OF ANTERIOR COMMISSURE  Anesthesia: General  Surgeon(s): [name], DO  Staff: Circulator: Arnoldo Morale, RN; Rometta Emery, RN Scrub Person: Rolan Bucco  Implants: * No implants in log *  Specimens: ID Type Source Tests Collected by Time Destination  1 : lesion of anterior commissure Tissue PATH ENT biopsy SURGICAL PATHOLOGY Bruna Dills A, DO [id number]     Complications: None  EBL: <2 ML  Condition: stable  Operative Findings:  Exophytic, pedunculated fibrotic-appearing lesion extending from the anterior commissure without obvious involvement of either true vocal fold, other laryngeal structures appear normal  Description of Operation: Once operative consent was obtained, and the surgical site confirmed with the operating room team, the patient was brought back to the operating room and general endotracheal anesthesia was obtained. The patient was turned over to the ENT service. An operating laryngoscope was used to directly visualize the upper airway and glottis. All anatomic areas from the oral cavity to the glottis were examined and noted to be normal with only exceptions noted in this report. Areas examined included the oropharynx, vallecula, both surfaces of the epiglottis, glottis, post cricoid region and bilateral pyriform sinuses.   The patient was placed in laryngeal suspension with the focus on the glottis with the ET tube intact.   An operating microscope was used to visualize the vocal cords and a pedunculated lesion was removed from the anterior commissure with a laryngeal scissor and sickle  knife. Hemostasis was obtained with an adrenaline soaked pledget.   An oral gastric tube was placed into the stomach and suctioned to reduce postoperative nausea. The patient was turned back over to the anesthesia service. The patient was transferred to the PACU in stable condition.    Jason Coop, [facility]  03/02/2022

## 2022-03-02 NOTE — Anesthesia Preprocedure Evaluation (Addendum)
Anesthesia Evaluation  Patient identified by MRN, date of birth, ID band Patient awake    Reviewed: Allergy & Precautions, NPO status , Patient's Chart, lab work & pertinent test results  Airway Mallampati: I  TM Distance: >3 FB Neck ROM: Full    Dental  (+) Edentulous Lower, Edentulous Upper   Pulmonary asthma , Current Smoker and Patient abstained from smoking.   Pulmonary exam normal        Cardiovascular negative cardio ROS Normal cardiovascular exam     Neuro/Psych  Neuromuscular disease  negative psych ROS   GI/Hepatic negative GI ROS, Neg liver ROS,,,  Endo/Other  diabetes, Oral Hypoglycemic Agents    Renal/GU negative Renal ROS     Musculoskeletal  (+) Arthritis ,    Abdominal  (+) + obese  Peds  Hematology negative hematology ROS (+)   Anesthesia Other Findings Mass of larynx  Reproductive/Obstetrics                              Anesthesia Physical Anesthesia Plan  ASA: 3  Anesthesia Plan: General   Post-op Pain Management:    Induction: Intravenous  PONV Risk Score and Plan: 1 and Ondansetron, Dexamethasone and Treatment may vary due to age or medical condition  Airway Management Planned: Oral ETT and Video Laryngoscope Planned  Additional Equipment:   Intra-op Plan:   Post-operative Plan:   Informed Consent: I have reviewed the patients History and Physical, chart, labs and discussed the procedure including the risks, benefits and alternatives for the proposed anesthesia with the patient or authorized representative who has indicated his/her understanding and acceptance.       Plan Discussed with: CRNA  Anesthesia Plan Comments:         Anesthesia Quick Evaluation

## 2022-03-02 NOTE — H&P (Signed)
Mitchell [name] is an 72 y.o. male.    Chief Complaint:  Laryngeal Mass  HPI: Patient presents today for planned elective procedure.  He denies any interval change in history since office visit on 02/12/2022:  Mitchell [name] is a 72 y.o. male who presents as a new consult, referred by Charm Rings*, for evaluation and treatment of possible laryngeal mass. Patient recently had screening CT due to history except of extensive tobacco use, which noted a questionable exophytic nodule of the anterior superior trachea. Patient referred to ENT for further evaluation. Patient reports he continues to smoke about 1 pack/day, and has a 60-pack-year history. He denies dysphagia, odynophagia, unintentional weight loss, vocal change.   Past Medical History:  Diagnosis Date   Arthritis    Asthma    Cellulitis of right lower extremity 05/09/2014   Diabetes mellitus without complication (HCC)    Diabetic foot ulcer ([place]) 07/07/2014   Status post bedside debridement of multiple right plantar ulcers by podiatrist, Dr. Laurena Spies.   Hypercholesterolemia    IDA (iron deficiency anemia)    Maggot infestation    right foot ulcer   Obesity 05/09/2014   Pneumonia    Tobacco abuse 07/07/2014    Past Surgical History:  Procedure Laterality Date   APPENDECTOMY     APPLICATION OF WOUND VAC Right 10/27/2020   Procedure: APPLICATION OF WOUND VAC;  Surgeon: Serafina Mitchell, MD;  Location: [facility] OR;  Service: Vascular;  Laterality: Right;   ESOPHAGOGASTRODUODENOSCOPY (EGD) WITH PROPOFOL N/A 10/28/2020   Procedure: ESOPHAGOGASTRODUODENOSCOPY (EGD) WITH PROPOFOL;  Surgeon: Sharyn Creamer, MD;  Location: [place];  Service: Gastroenterology;  Laterality: N/A;   HEMOSTASIS CLIP PLACEMENT  10/28/2020   Procedure: HEMOSTASIS CLIP PLACEMENT;  Surgeon: Sharyn Creamer, MD;  Location: [facility] ENDOSCOPY;  Service: Gastroenterology;;   HOT HEMOSTASIS N/A 10/28/2020   Procedure: HOT HEMOSTASIS (ARGON PLASMA COAGULATION/BICAP);  Surgeon:  Sharyn Creamer, MD;  Location: [place];  Service: Gastroenterology;  Laterality: N/A;   PERIPHERAL VASCULAR CATHETERIZATION N/A 07/22/2015   Procedure: Abdominal Aortogram w/Lower Extremity;  Surgeon: Elam Dutch, MD;  Location: [place];  Service: Cardiovascular;  Laterality: N/A;   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 No pertinent family history.  Social History:  reports that he has been smoking cigarettes. He has been smoking an average of .75 packs per day. He has never used smokeless tobacco. He reports current alcohol use. He reports that he does not use drugs.  Allergies:  Allergies  Allergen Reactions   Penicillins Itching and Rash    Has patient had a PCN reaction causing immediate rash, facial/tongue/throat swelling, SOB or lightheadedness with hypotension: Yes Has patient had a PCN reaction causing severe rash involving mucus membranes or skin necrosis: No Has patient had a PCN reaction that required hospitalization No Has patient had a PCN reaction occurring within the last 10 years: No If all of the above answers are "NO", then may proceed with Cephalosporin use. Has tolerate multiple cephalosporins in the past     Medications Prior to Admission  Medication Sig Dispense Refill   aspirin EC 81 MG tablet Take 81 mg by mouth daily.     cholecalciferol (VITAMIN D3) 25 MCG (1000 UNIT) tablet Take 1,000 Units by mouth daily.     ferrous sulfate 325 (65 FE) MG tablet Take 1 tablet (  325 mg total) by mouth daily with breakfast. 30 tablet 1   fluticasone (FLONASE) 50 MCG/ACT nasal spray Place 1 spray into both nostrils at bedtime.     furosemide (LASIX) 20 MG tablet Take 1 tablet (20 mg total) by mouth daily. 30 tablet 0   gabapentin (NEURONTIN) 300 MG capsule Take 1 capsule (300 mg total) by mouth 3 (three)  times daily. (Patient taking differently: Take 300 mg by mouth 2 (two) times daily.) 90 capsule 0   JARDIANCE 25 MG TABS tablet Take 25 mg by mouth daily.      NOVOLOG FLEXPEN 100 UNIT/ML FlexPen Inject 30 Units into the skin daily.     OZEMPIC, 1 MG/DOSE, 4 MG/3ML SOPN Inject 0.5 mg into the skin once a week.     polyethylene glycol (MIRALAX / GLYCOLAX) 17 g packet Take 17 g by mouth daily. (Patient taking differently: Take 17 g by mouth daily as needed for mild constipation or moderate constipation.) 14 each 0   rosuvastatin (CRESTOR) 5 MG tablet Take 5 mg by mouth daily.     TRESIBA FLEXTOUCH 200 UNIT/ML SOPN Inject 10-20 Units into the skin See admin instructions. Take 20 units in the morning and night and 10 in the afternoon      Results for orders placed or performed during the hospital encounter of 03/02/22 (from the past 48 hour(s))  SARS Coronavirus 2 by RT PCR (hospital order, performed in [facility] hospital lab) *cepheid single result test* Anterior Nasal Swab     Status: None   Collection Time: 03/02/22  5:30 AM   Specimen: Anterior Nasal Swab  Result Value Ref Range   SARS Coronavirus 2 by RT PCR NEGATIVE NEGATIVE    Comment: Performed at [facility], [address], [place], [place] [address]  Glucose, capillary     Status: Abnormal   Collection Time: 03/02/22  6:02 AM  Result Value Ref Range   Glucose-Capillary 169 (H) 70 - 99 mg/dL    Comment: Glucose reference range applies only to samples taken after fasting for at least 8 hours.  Basic metabolic panel per protocol     Status: Abnormal   Collection Time: 03/02/22  6:22 AM  Result Value Ref Range   Sodium 140 135 - 145 mmol/L   Potassium 4.6 3.5 - 5.1 mmol/L   Chloride 99 98 - 111 mmol/L   CO2 31 22 - 32 mmol/L   Glucose, Bld 158 (H) 70 - 99 mg/dL    Comment: Glucose reference range applies only to samples taken after fasting for at least 8 hours.   BUN 20 8 - 23 mg/dL   Creatinine, Ser 1.19 0.61 - 1.24 mg/dL    Calcium 9.0 8.9 - 10.3 mg/dL   GFR, Estimated >60 >60 mL/min    Comment: (NOTE) Calculated using the CKD-EPI Creatinine Equation (2021)    Anion gap 10 5 - 15    Comment: Performed at [facility] [address] [place], [place] [address]  CBC per protocol     Status: Abnormal   Collection Time: 03/02/22  6:22 AM  Result Value Ref Range   WBC 10.5 4.0 - 10.5 K/uL   RBC 5.74 4.22 - 5.81 MIL/uL   Hemoglobin 17.0 13.0 - 17.0 g/dL   HCT 52.3 (H) 39.0 - 52.0 %   MCV 91.1 80.0 - 100.0 fL   MCH 29.6 26.0 - 34.0 pg   MCHC 32.5 30.0 - 36.0 g/dL   RDW  13.4 11.5 - 15.5 %   Platelets 171 150 - 400 K/uL   nRBC 0.0 0.0 - 0.2 %    Comment: Performed at [facility], [place] [address], [place], [place] [address]   No results found.  ROS: ROS  Blood pressure (!) 160/73, pulse 68, temperature 98.5 F (36.9 C), temperature source Oral, resp. rate 18, height 5' 11.5" (1.816 m), weight 117.9 kg, SpO2 95 %.  PHYSICAL EXAM: Physical Exam Constitutional:      Appearance: Normal appearance.  HENT:     Right Ear: External ear normal.     Left Ear: External ear normal.     Nose: Nose normal.  Pulmonary:     Effort: Pulmonary effort is normal.  Neurological:     General: No focal deficit present.     Mental Status: He is alert and oriented to person, place, and time.  Psychiatric:        Mood and Affect: Mood normal.        Behavior: Behavior normal.     Studies Reviewed: Ct chest reviewed  Assessment/Plan Mitchell [name] is a 72 y.o. male with extensive tobacco use history with recent abnormal finding on screening CT chest. Laryngoscopy performed in office demonstrated an exophytic mass lesion at the level of the anterior commissure, with normal true vocal fold mobility.  -To OR today for microlaryngoscopy and biopsy in the near future. Risks of surgery, expected postoperative course and recovery were reviewed with patient, who expressed understanding and agreement.    Mitchell [name] A  Mitchell [name] 03/02/2022, 7:26 AM

## 2022-03-02 NOTE — Transfer of Care (Signed)
Immediate Anesthesia Transfer of Care Note  Patient: Mitchell [name]  Procedure(s) Performed: MICROLARYNGOSCOPY AND BIOPSY (Bilateral: Throat)  Patient Location: PACU  Anesthesia Type:General  Level of Consciousness: awake, alert , and oriented  Airway & Oxygen Therapy: Patient Spontanous Breathing and Patient connected to face mask oxygen  Post-op Assessment: Report given to RN and Post -op Vital signs reviewed and stable  Post vital signs: Reviewed and stable  Last Vitals:  Vitals Value Taken Time  BP 174/84 03/02/22 0848  Temp 36.6 C 03/02/22 0848  Pulse 82 03/02/22 0851  Resp 21 03/02/22 0851  SpO2 93 % 03/02/22 0851  Vitals shown include unvalidated device data.  Last Pain:  Vitals:   03/02/22 0559  TempSrc: Oral         Complications: No notable events documented.

## 2022-03-03 NOTE — Anesthesia Postprocedure Evaluation (Signed)
Anesthesia Post Note  Patient: Sadrac Salata  Procedure(s) Performed: MICROLARYNGOSCOPY AND BIOPSY (Bilateral: Throat)     Patient location during evaluation: PACU Anesthesia Type: General Level of consciousness: awake Pain management: pain level controlled Vital Signs Assessment: post-procedure vital signs reviewed and stable Respiratory status: spontaneous breathing, nonlabored ventilation and respiratory function stable Cardiovascular status: blood pressure returned to baseline and stable Postop Assessment: no apparent nausea or vomiting Anesthetic complications: no   No notable events documented.  Last Vitals:  Vitals:   03/02/22 0903 03/02/22 0918  BP: (!) 161/65 137/65  Pulse: 80 73  Resp: 19 14  Temp:  36.6 C  SpO2: 92% 94%    Last Pain:  Vitals:   03/02/22 0918  TempSrc:   PainSc: 0-No pain                 Silvana Holecek P Elynor Kallenberger

## 2022-03-04 ENCOUNTER — Encounter (HOSPITAL_COMMUNITY): Payer: Self-pay | Admitting: Otolaryngology

## 2022-03-05 LAB — SURGICAL PATHOLOGY

## 2022-03-22 DIAGNOSIS — I10 Essential (primary) hypertension: Secondary | ICD-10-CM | POA: Diagnosis not present

## 2022-03-22 DIAGNOSIS — F17210 Nicotine dependence, cigarettes, uncomplicated: Secondary | ICD-10-CM | POA: Diagnosis not present

## 2022-03-22 DIAGNOSIS — E1165 Type 2 diabetes mellitus with hyperglycemia: Secondary | ICD-10-CM | POA: Diagnosis not present

## 2022-03-22 DIAGNOSIS — J439 Emphysema, unspecified: Secondary | ICD-10-CM | POA: Diagnosis not present

## 2022-03-22 DIAGNOSIS — Z6841 Body Mass Index (BMI) 40.0 and over, adult: Secondary | ICD-10-CM | POA: Diagnosis not present

## 2022-03-22 DIAGNOSIS — Z299 Encounter for prophylactic measures, unspecified: Secondary | ICD-10-CM | POA: Diagnosis not present

## 2022-04-03 IMAGING — MR MR FOOT*R* WO/W CM
9 series · 40 of 40 positions shown · IV contrast (10 ml Gadavist)
Comparison: X-ray 10/25/2020

CLINICAL DATA: Nonhealing wounds. Diabetic foot ulcer. Concern for
osteomyelitis.

EXAM:
MRI OF THE RIGHT FOREFOOT WITHOUT AND WITH CONTRAST
TECHNIQUE: Multiplanar, multisequence MR imaging of the right forefoot was
performed before and after the administration of intravenous
contrast.
CONTRAST:  10mL GADAVIST GADOBUTROL 1 MMOL/ML IV SOLN

[Series 5: T1 · coronal · right · 3.0mm · 0.55mm/px · 7 of 56 slices shown (1 of 2)]
[im 1/56]
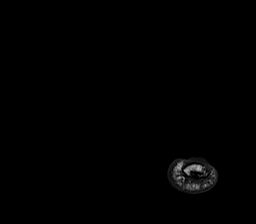
[im 10/56]
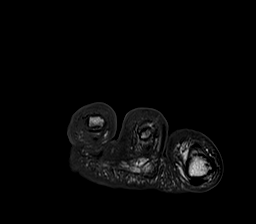
[im 19/56]
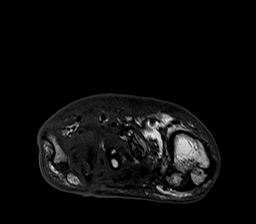
[im 28/56]
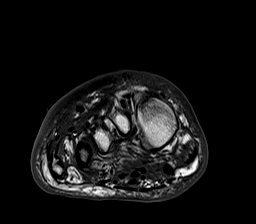
[im 37/56]
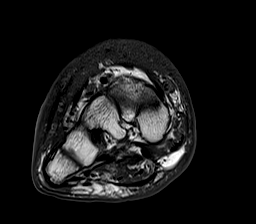
[im 46/56]
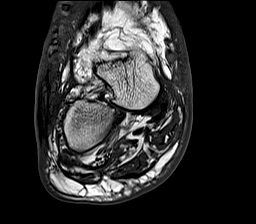
[im 56/56]
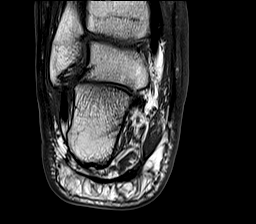

[Series 6: T2 fat-sat · coronal · right · 3.0mm · 0.38mm/px · 6 of 56 slices shown (1 of 2)]
[im 1/56]
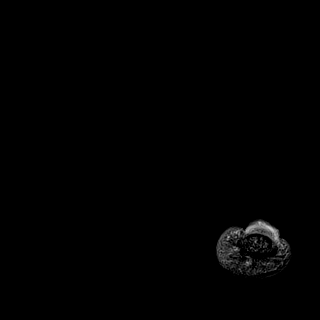
[im 12/56]
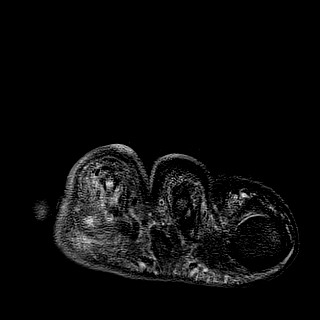
[im 23/56]
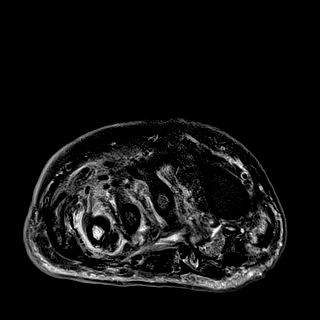
[im 34/56]
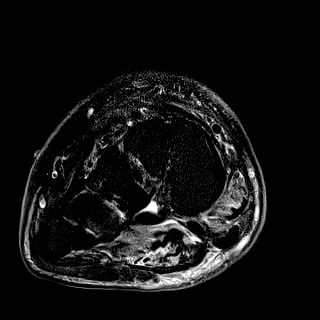
[im 45/56]
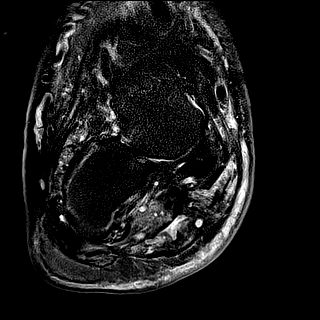
[im 56/56]
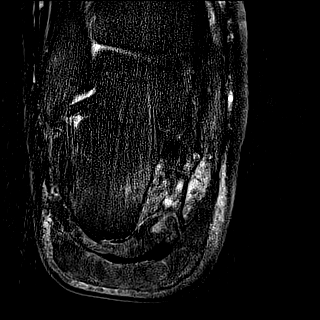

[Series 9: T1 · oblique · right · 3.0mm · 0.78mm/px · 3 of 30 slices shown (2 of 2)]
[im 1/30]
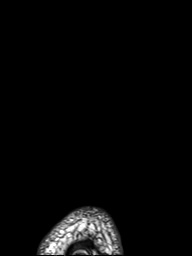
[im 15/30]
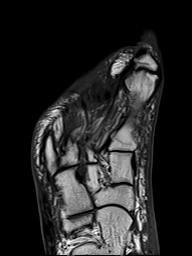
[im 30/30]
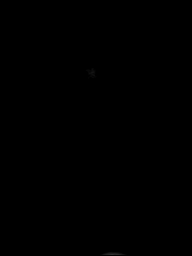

[Series 10: T2 fat-sat · oblique · right · 3.0mm · 0.78mm/px · 3 of 30 slices shown (2 of 2)]
[im 1/30]
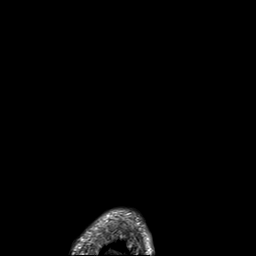
[im 15/30]
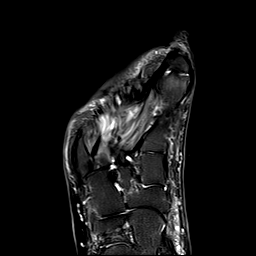
[im 30/30]
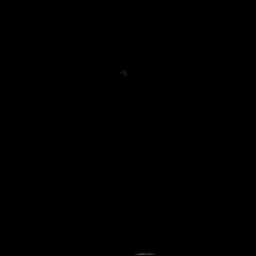

[Series 11: STIR · sagittal · right · 3.0mm · 0.39mm/px · 3 of 32 slices shown]
[im 1/32]
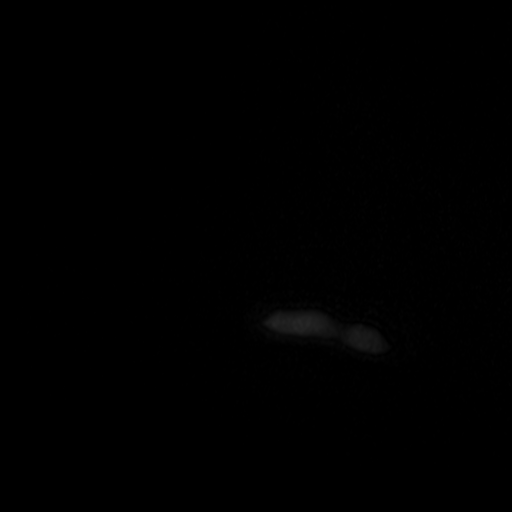
[im 16/32]
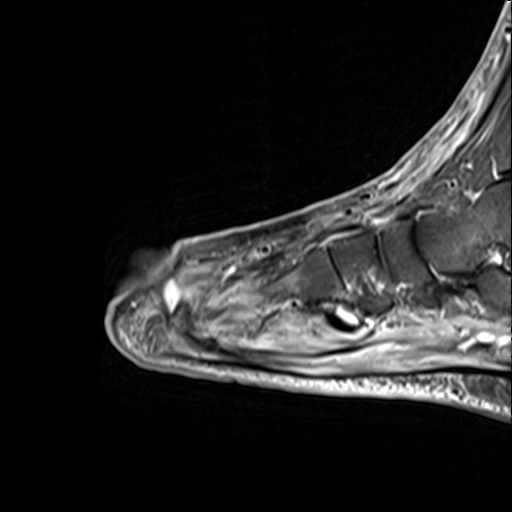
[im 32/32]
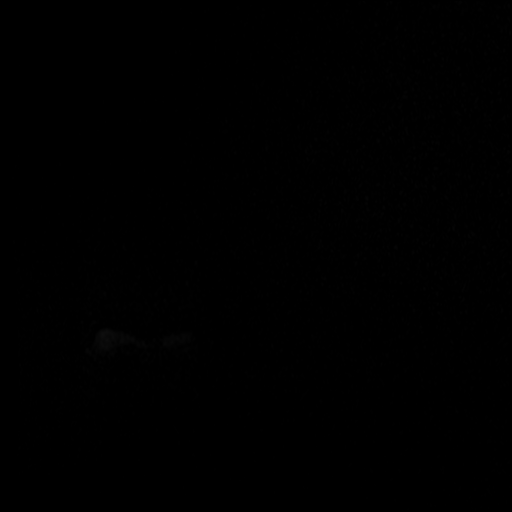

[Series 12: axial t1fs · coronal · right · 3.0mm · 0.55mm/px · 6 of 58 slices shown]
[im 1/58]
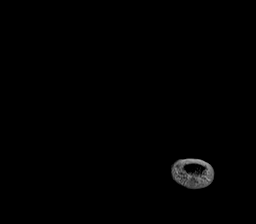
[im 12/58]
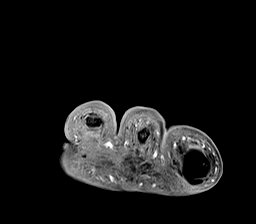
[im 23/58]
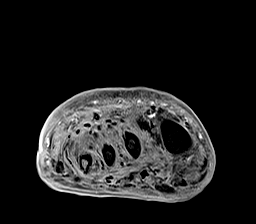
[im 35/58]
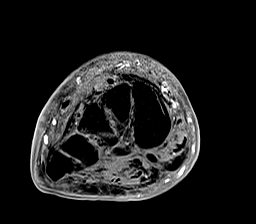
[im 46/58]
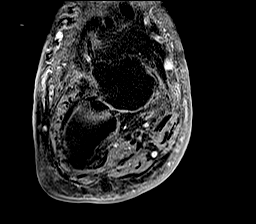
[im 58/58]
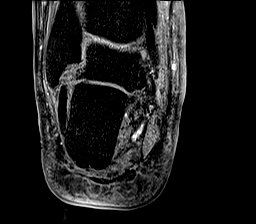

[Series 13: T1 fat-sat post-contrast · coronal · right · 3.0mm · 0.55mm/px · 6 of 58 slices shown (1 of 3)]
[im 1/58]
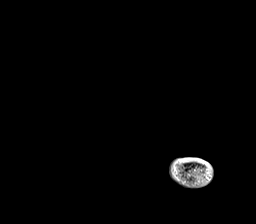
[im 12/58]
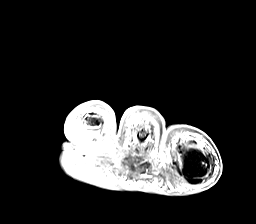
[im 23/58]
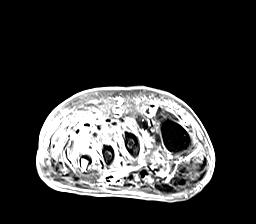
[im 35/58]
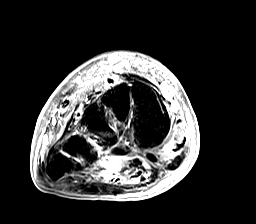
[im 46/58]
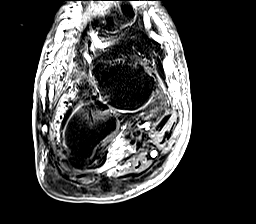
[im 58/58]
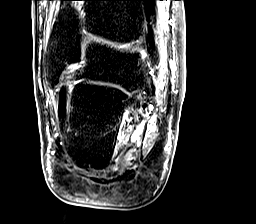

[Series 14: T1 fat-sat post-contrast · sagittal · right · 3.0mm · 0.78mm/px · 3 of 32 slices shown (2 of 3)]
[im 1/32]
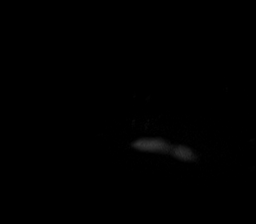
[im 16/32]
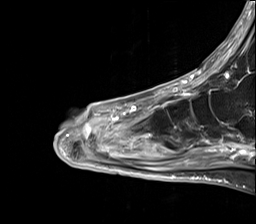
[im 32/32]
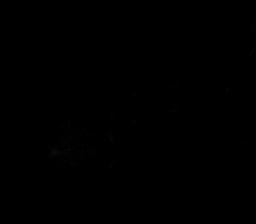

[Series 15: T1 fat-sat post-contrast · oblique · right · 3.0mm · 0.78mm/px · 3 of 32 slices shown (3 of 3)]
[im 1/32]
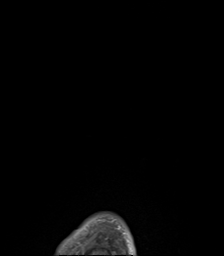
[im 16/32]
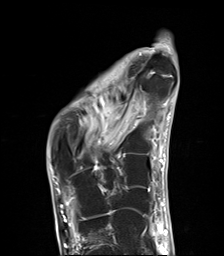
[im 32/32]
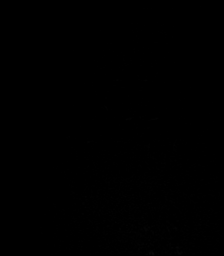

[40 of 40 positions shown; findings below may reference images not displayed]

FINDINGS: Bones/Joint/Cartilage

Postsurgical changes of third toe amputation at the third MTP joint
level. The fourth MTP joint is dorsally dislocated. Marked bone
marrow edema and enhancement with confluent low T1 signal change
throughout the proximal phalanx of the fourth toe and throughout the
fourth metatarsal extending from the level of the metatarsal head to
the proximal metaphysis, compatible with acute osteomyelitis.
Complex fourth MTP joint effusion compatible with septic arthritis.
Preservation of the marrow signal within the middle and distal
phalanx of fourth toe.

Fifth toe hammertoe deformity. There is bone marrow edema within the
fifth toe and fifth metatarsal head without confluent low T1 signal
changes.

Acute or subacute fracture involving the second toe proximal phalanx
without significant displacement (series 11, image 20).

Degenerative changes of the great toe IP joint. Marrow signal within
the first ray is maintained. No bony abnormality is seen within the
midfoot.

Ligaments

Intact Lisfranc ligament.

Muscles and Tendons

Findings of denervation and/or myositis throughout the forefoot. No
significant tenosynovial fluid collection.

Soft tissues

Soft tissue ulceration underlying the level of the fourth MTP joint
with rim enhancing air and fluid collection surrounding the fourth
metatarsal head and fourth MTP joint measuring 3.1 x 1.5 x 1.9 cm
(series 13, image 18). Additional adjacent abscess overlies the
dorsal aspect of the fourth MTP joint measuring 2.1 x 0.9 x 2.0 cm
(series 14, image 12). Extensive circumferential subcutaneous
edema.
IMPRESSION: 1. Acute osteomyelitis of the fourth metatarsal and proximal phalanx
of the fourth toe.
2. Plantar soft tissue ulceration underlying the level of the fourth
MTP joint with 3.1 cm abscess surrounding the fourth metatarsal head
and fourth MTP joint. Additional 2.1 cm abscess overlies the dorsal
aspect of the fourth MTP joint.
3. Bone marrow edema within the fifth toe and fifth metatarsal head
without confluent low T1 signal changes. Findings may reflect
reactive osteitis. Early acute osteomyelitis at this site is
difficult to exclude
4. Acute or subacute fracture involving the second toe proximal
phalanx.
5. Extensive circumferential subcutaneous edema, likely cellulitis.
6. Findings of muscle denervation and/or myositis throughout the
forefoot.

## 2022-05-25 DIAGNOSIS — E1165 Type 2 diabetes mellitus with hyperglycemia: Secondary | ICD-10-CM | POA: Diagnosis not present

## 2022-05-25 DIAGNOSIS — Z794 Long term (current) use of insulin: Secondary | ICD-10-CM | POA: Diagnosis not present

## 2022-05-25 DIAGNOSIS — Z299 Encounter for prophylactic measures, unspecified: Secondary | ICD-10-CM | POA: Diagnosis not present

## 2022-05-25 DIAGNOSIS — I10 Essential (primary) hypertension: Secondary | ICD-10-CM | POA: Diagnosis not present

## 2022-05-31 DIAGNOSIS — R490 Dysphonia: Secondary | ICD-10-CM | POA: Diagnosis not present

## 2022-05-31 DIAGNOSIS — Z720 Tobacco use: Secondary | ICD-10-CM | POA: Diagnosis not present

## 2022-05-31 DIAGNOSIS — K219 Gastro-esophageal reflux disease without esophagitis: Secondary | ICD-10-CM | POA: Diagnosis not present

## 2022-05-31 DIAGNOSIS — J381 Polyp of vocal cord and larynx: Secondary | ICD-10-CM | POA: Diagnosis not present

## 2022-06-28 DIAGNOSIS — G629 Polyneuropathy, unspecified: Secondary | ICD-10-CM | POA: Diagnosis not present

## 2022-06-28 DIAGNOSIS — C140 Malignant neoplasm of pharynx, unspecified: Secondary | ICD-10-CM | POA: Diagnosis not present

## 2022-06-28 DIAGNOSIS — Z0000 Encounter for general adult medical examination without abnormal findings: Secondary | ICD-10-CM | POA: Diagnosis not present

## 2022-06-28 DIAGNOSIS — I10 Essential (primary) hypertension: Secondary | ICD-10-CM | POA: Diagnosis not present

## 2022-06-28 DIAGNOSIS — I700 Atherosclerosis of aorta: Secondary | ICD-10-CM | POA: Diagnosis not present

## 2022-06-28 DIAGNOSIS — Z299 Encounter for prophylactic measures, unspecified: Secondary | ICD-10-CM | POA: Diagnosis not present

## 2022-08-29 DIAGNOSIS — Z299 Encounter for prophylactic measures, unspecified: Secondary | ICD-10-CM | POA: Diagnosis not present

## 2022-08-29 DIAGNOSIS — E1169 Type 2 diabetes mellitus with other specified complication: Secondary | ICD-10-CM | POA: Diagnosis not present

## 2022-08-29 DIAGNOSIS — J439 Emphysema, unspecified: Secondary | ICD-10-CM | POA: Diagnosis not present

## 2022-08-29 DIAGNOSIS — E1142 Type 2 diabetes mellitus with diabetic polyneuropathy: Secondary | ICD-10-CM | POA: Diagnosis not present

## 2022-08-29 DIAGNOSIS — I10 Essential (primary) hypertension: Secondary | ICD-10-CM | POA: Diagnosis not present

## 2022-09-03 DIAGNOSIS — Z89431 Acquired absence of right foot: Secondary | ICD-10-CM | POA: Diagnosis not present

## 2022-09-03 DIAGNOSIS — D692 Other nonthrombocytopenic purpura: Secondary | ICD-10-CM | POA: Diagnosis not present

## 2022-09-03 DIAGNOSIS — F17210 Nicotine dependence, cigarettes, uncomplicated: Secondary | ICD-10-CM | POA: Diagnosis not present

## 2022-09-03 DIAGNOSIS — Z6836 Body mass index (BMI) 36.0-36.9, adult: Secondary | ICD-10-CM | POA: Diagnosis not present

## 2022-09-03 DIAGNOSIS — E1122 Type 2 diabetes mellitus with diabetic chronic kidney disease: Secondary | ICD-10-CM | POA: Diagnosis not present

## 2022-09-03 DIAGNOSIS — J439 Emphysema, unspecified: Secondary | ICD-10-CM | POA: Diagnosis not present

## 2022-09-03 DIAGNOSIS — Z008 Encounter for other general examination: Secondary | ICD-10-CM | POA: Diagnosis not present

## 2022-09-03 DIAGNOSIS — E1169 Type 2 diabetes mellitus with other specified complication: Secondary | ICD-10-CM | POA: Diagnosis not present

## 2022-09-03 DIAGNOSIS — F325 Major depressive disorder, single episode, in full remission: Secondary | ICD-10-CM | POA: Diagnosis not present

## 2022-09-03 DIAGNOSIS — E785 Hyperlipidemia, unspecified: Secondary | ICD-10-CM | POA: Diagnosis not present

## 2022-09-03 DIAGNOSIS — I700 Atherosclerosis of aorta: Secondary | ICD-10-CM | POA: Diagnosis not present

## 2022-09-03 DIAGNOSIS — Z794 Long term (current) use of insulin: Secondary | ICD-10-CM | POA: Diagnosis not present

## 2022-09-03 DIAGNOSIS — N182 Chronic kidney disease, stage 2 (mild): Secondary | ICD-10-CM | POA: Diagnosis not present

## 2022-12-10 DIAGNOSIS — F17210 Nicotine dependence, cigarettes, uncomplicated: Secondary | ICD-10-CM | POA: Diagnosis not present

## 2022-12-10 DIAGNOSIS — I10 Essential (primary) hypertension: Secondary | ICD-10-CM | POA: Diagnosis not present

## 2022-12-10 DIAGNOSIS — J439 Emphysema, unspecified: Secondary | ICD-10-CM | POA: Diagnosis not present

## 2022-12-10 DIAGNOSIS — E1169 Type 2 diabetes mellitus with other specified complication: Secondary | ICD-10-CM | POA: Diagnosis not present

## 2022-12-10 DIAGNOSIS — Z299 Encounter for prophylactic measures, unspecified: Secondary | ICD-10-CM | POA: Diagnosis not present

## 2022-12-28 DIAGNOSIS — R5383 Other fatigue: Secondary | ICD-10-CM | POA: Diagnosis not present

## 2022-12-28 DIAGNOSIS — Z0000 Encounter for general adult medical examination without abnormal findings: Secondary | ICD-10-CM | POA: Diagnosis not present

## 2022-12-28 DIAGNOSIS — I10 Essential (primary) hypertension: Secondary | ICD-10-CM | POA: Diagnosis not present

## 2022-12-28 DIAGNOSIS — Z1339 Encounter for screening examination for other mental health and behavioral disorders: Secondary | ICD-10-CM | POA: Diagnosis not present

## 2022-12-28 DIAGNOSIS — Z7189 Other specified counseling: Secondary | ICD-10-CM | POA: Diagnosis not present

## 2022-12-28 DIAGNOSIS — Z1331 Encounter for screening for depression: Secondary | ICD-10-CM | POA: Diagnosis not present

## 2022-12-28 DIAGNOSIS — E7800 Pure hypercholesterolemia, unspecified: Secondary | ICD-10-CM | POA: Diagnosis not present

## 2022-12-28 DIAGNOSIS — Z299 Encounter for prophylactic measures, unspecified: Secondary | ICD-10-CM | POA: Diagnosis not present

## 2022-12-28 DIAGNOSIS — Z79899 Other long term (current) drug therapy: Secondary | ICD-10-CM | POA: Diagnosis not present

## 2023-03-28 DIAGNOSIS — E7800 Pure hypercholesterolemia, unspecified: Secondary | ICD-10-CM | POA: Diagnosis not present

## 2023-03-28 DIAGNOSIS — R5383 Other fatigue: Secondary | ICD-10-CM | POA: Diagnosis not present

## 2023-03-28 DIAGNOSIS — Z125 Encounter for screening for malignant neoplasm of prostate: Secondary | ICD-10-CM | POA: Diagnosis not present

## 2023-03-28 DIAGNOSIS — Z79899 Other long term (current) drug therapy: Secondary | ICD-10-CM | POA: Diagnosis not present

## 2023-04-01 DIAGNOSIS — J439 Emphysema, unspecified: Secondary | ICD-10-CM | POA: Diagnosis not present

## 2023-04-01 DIAGNOSIS — F17210 Nicotine dependence, cigarettes, uncomplicated: Secondary | ICD-10-CM | POA: Diagnosis not present

## 2023-04-01 DIAGNOSIS — Z299 Encounter for prophylactic measures, unspecified: Secondary | ICD-10-CM | POA: Diagnosis not present

## 2023-04-01 DIAGNOSIS — I10 Essential (primary) hypertension: Secondary | ICD-10-CM | POA: Diagnosis not present

## 2023-04-01 DIAGNOSIS — I700 Atherosclerosis of aorta: Secondary | ICD-10-CM | POA: Diagnosis not present

## 2023-04-01 DIAGNOSIS — E1169 Type 2 diabetes mellitus with other specified complication: Secondary | ICD-10-CM | POA: Diagnosis not present

## 2023-04-01 DIAGNOSIS — E1142 Type 2 diabetes mellitus with diabetic polyneuropathy: Secondary | ICD-10-CM | POA: Diagnosis not present

## 2023-08-01 DIAGNOSIS — I10 Essential (primary) hypertension: Secondary | ICD-10-CM | POA: Diagnosis not present

## 2023-08-01 DIAGNOSIS — Z0000 Encounter for general adult medical examination without abnormal findings: Secondary | ICD-10-CM | POA: Diagnosis not present

## 2023-08-01 DIAGNOSIS — E1165 Type 2 diabetes mellitus with hyperglycemia: Secondary | ICD-10-CM | POA: Diagnosis not present

## 2023-08-01 DIAGNOSIS — Z6839 Body mass index (BMI) 39.0-39.9, adult: Secondary | ICD-10-CM | POA: Diagnosis not present

## 2023-08-01 DIAGNOSIS — Z299 Encounter for prophylactic measures, unspecified: Secondary | ICD-10-CM | POA: Diagnosis not present

## 2023-08-01 DIAGNOSIS — R52 Pain, unspecified: Secondary | ICD-10-CM | POA: Diagnosis not present

## 2023-08-01 DIAGNOSIS — J439 Emphysema, unspecified: Secondary | ICD-10-CM | POA: Diagnosis not present

## 2023-12-11 ENCOUNTER — Ambulatory Visit (HOSPITAL_COMMUNITY): Attending: Podiatry | Admitting: Physical Therapy

## 2023-12-11 ENCOUNTER — Other Ambulatory Visit: Payer: Self-pay

## 2023-12-11 DIAGNOSIS — M79661 Pain in right lower leg: Secondary | ICD-10-CM | POA: Diagnosis present

## 2023-12-11 DIAGNOSIS — S81801D Unspecified open wound, right lower leg, subsequent encounter: Secondary | ICD-10-CM | POA: Insufficient documentation

## 2023-12-11 DIAGNOSIS — R600 Localized edema: Secondary | ICD-10-CM | POA: Diagnosis present

## 2023-12-11 NOTE — Therapy (Signed)
 OUTPATIENT PHYSICAL THERAPY Wound EVALUATION   Patient Name: Mitchell [name] MRN: [id number] DOB:[birth year], 73 y.o., male Today's Date: 12/11/2023   PCP: Eligio Fairly REFERRING PROVIDER: Roddie Bring, DPM  END OF SESSION:  [name] End of Session - 12/11/23 1058     Visit Number 1    Number of Visits 24    Date for Recertification  02/21/24    Authorization Type [employer]    [name] Start Time 1000    [name] Stop Time 1048    [name] Time Calculation (min) 48 min    Activity Tolerance Patient tolerated treatment well    Behavior During Therapy [name] for tasks assessed/performed          Past Medical History:  Diagnosis Date   Arthritis    Asthma    Cellulitis of right lower extremity 05/09/2014   Diabetes mellitus without complication (HCC)    Diabetic foot ulcer (HCC) 07/07/2014   Status post bedside debridement of multiple right plantar ulcers by podiatrist, Dr. Macie.   Hypercholesterolemia    IDA (iron deficiency anemia)    Maggot infestation    right foot ulcer   Obesity 05/09/2014   Pneumonia    Tobacco abuse 07/07/2014   Past Surgical History:  Procedure Laterality Date   APPENDECTOMY     APPLICATION OF WOUND VAC Right 10/27/2020   Procedure: APPLICATION OF WOUND VAC;  Surgeon: Serene Gaile ORN, MD;  Location: [facility] OR;  Service: Vascular;  Laterality: Right;   ESOPHAGOGASTRODUODENOSCOPY (EGD) WITH PROPOFOL  N/A 10/28/2020   Procedure: ESOPHAGOGASTRODUODENOSCOPY (EGD) WITH PROPOFOL ;  Surgeon: Federico Rosario BROCKS, MD;  Location: [facility] ENDOSCOPY;  Service: Gastroenterology;  Laterality: N/A;   HEMOSTASIS CLIP PLACEMENT  10/28/2020   Procedure: HEMOSTASIS CLIP PLACEMENT;  Surgeon: Federico Rosario BROCKS, MD;  Location: [facility] ENDOSCOPY;  Service: Gastroenterology;;   HOT HEMOSTASIS N/A 10/28/2020   Procedure: HOT HEMOSTASIS (ARGON PLASMA COAGULATION/BICAP);  Surgeon: Federico Rosario BROCKS, MD;  Location: [facility] ENDOSCOPY;  Service: Gastroenterology;  Laterality: N/A;   MICROLARYNGOSCOPY Bilateral 03/02/2022    Procedure: MICROLARYNGOSCOPY AND BIOPSY;  Surgeon: Llewellyn Gerard LABOR, DO;  Location: [facility] OR;  Service: ENT;  Laterality: Bilateral;   PERIPHERAL VASCULAR CATHETERIZATION N/A 07/22/2015   Procedure: Abdominal Aortogram w/Lower Extremity;  Surgeon: Carlin FORBES Haddock, MD;  Location: [facility] INVASIVE CV LAB;  Service: Cardiovascular;  Laterality: N/A;   TRANSMETATARSAL AMPUTATION Right 10/27/2020   Procedure: TRANSMETATARSAL AMPUTATION;  Surgeon: Serene Gaile ORN, MD;  Location: [facility] OR;  Service: Vascular;  Laterality: Right;   Patient Active Problem List   Diagnosis Date Noted   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/02/2022   Foot osteomyelitis, right (HCC) 10/25/2020   GI bleed 10/25/2020   Blood loss anemia 10/25/2020   Osteomyelitis of right foot (HCC) 10/25/2020   Closed fracture of proximal end of right humerus with routine healing 02/25/2019   Pre-ulcerative calluses 04/20/2018   Myositis 07/08/2014   Diabetic foot ulcer associated with type 2 diabetes mellitus (HCC) 07/07/2014   Tobacco abuse 07/07/2014   Diabetic ulcer of right foot (HCC) 05/12/2014   Pulse weakness    Peripheral neuropathy    Cellulitis of right lower extremity 05/09/2014   Obesity 05/09/2014   Cellulitis of right lower leg 05/09/2014   Hyperglycemia 03/21/2013   Type 2 diabetes mellitus with foot ulcer (HCC) 03/21/2013   Noncompliance with medications due to cost issues 03/21/2013    ONSET DATE: [name] has had wounds with this leg for years with a transmetatarsal amputation in 2022.  [name] states current wounds have  been there for 4 weeks.   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  He was seeing his primary who sent him to a specialist who sent him here.    Patient and Family Stated Goals wounds to heal    Date of Onset 11/13/23   approximate   Prior Treatments family MD< podiatrist and antibiotics.    Pain Scale 0-10    Pain Score 5     Pain Type Chronic pain;Acute pain    Pain Location Foot    Pain Onset With Activity    Patients Stated Pain Goal 0    Evaluation and Treatment Procedures Explained to Patient/Family Yes    Evaluation and Treatment Procedures agreed to    Wound Properties Date First Assessed: 12/11/23 Time First Assessed: 1010 Present on Original Admission: Yes Primary Wound Type: Vascular Ulcer Secondary Wound Type - Vascular Ulcer: Venous Location: Leg Location Orientation: Distal;Right;Lateral   Site / Wound Assessment Dusky no granulation    Peri-wound Assessment Edema;Erythema (blanchable)    Wound Length (cm) 6.5 cm   there is a smaller wound inferior to this that is 1.5x 1.o with .2 cm depth   Wound Width (cm) 2.5 cm    Wound Surface Area (cm^2) 12.76 cm^2    Wound Depth (cm) 0.2 cm    Wound Volume (cm^3) 1.702 cm^3    Drainage Description Serous;Odor - foul    Drainage Amount Moderate    Treatments Cleansed;Moisturizing cream;Site care;Other (Comment)   debridement and dressing change.   Dressing Type Gauze (Comment)    Dressing Changed Changed    Dressing Status Old drainage    Wound Properties Date First Assessed: 12/11/23 Time First Assessed: 1020 Present on Original Admission: Yes Primary Wound Type: Vascular Ulcer Secondary Wound Type - Vascular Ulcer: Venous Location: Pretibial Location Orientation: Distal;Right;Medial   Site / Wound Assessment  Yellow;Pink - 15% granulation    Peri-wound Assessment Edema;Erythema (blanchable)    Wound Length (cm) 1.5 cm    Wound Width (cm) 1.2 cm    Wound Surface Area (cm^2) 1.41 cm^2    Wound Depth (cm) 0.3 cm    Wound Volume (cm^3) 0.283 cm^3    Drainage Description Odor - foul;Serous    Drainage Amount Moderate    Treatments Cleansed;Moisturizing cream;Site care;Other (Comment)   debridement.   Dressing Type Gauze (Comment)    Dressing Changed Changed    Dressing Status Old drainage    Wound Properties Date First Assessed: 12/11/23 Time First Assessed: 1030 Present on Original Admission: Yes Primary Wound Type: Neuropathic Ulcer Location: Foot Location Orientation: Right   Site / Wound Assessment Other (Comment);Dusky   area around is macerated and white.   Peri-wound Assessment Maceration    Wound Length (cm) 2.5 cm    Wound Width (cm) 3 cm     Wound Surface Area (cm^2) 5.89 cm^2    Drainage Amount Moderate    Treatments Cleansed;Site care;Other (  Comment)    Dressing Type Gauze (Comment)    Dressing Changed Changed    Dressing Status Old drainage    % Wound base Red or Granulating 5%    % Wound base Yellow/Fibrinous Exudate 95%    Tunneling (cm) throughout wound    Wound Therapy - Clinical Statement see below    Wound Therapy - Functional Problem List see below    Factors Delaying/Impairing Wound Healing Diabetes Mellitus;Infection - systemic/local;Multiple medical problems;Polypharmacy;Tobacco use;Vascular compromise    Hydrotherapy Plan Debridement;Dressing change;Patient/family education;Other (comment)   manual   Wound Therapy - Frequency 2X / week    Wound Therapy - Current Recommendations [name]    Wound Plan cleanse, moisturize, debride and proper dressing    Dressing  vaseline to LE, silverhydrofiber to wounds followed by 4x4 and profore lite compression            PATIENT EDUCATION: Education details: The importance of quitting smoking, keeping blood sugars near 100 and  increasing water and protein intake for wounds to heal.  Person educated: Patient Education method: Explanation Education comprehension: verbalized understanding   HOME EXERCISE PROGRAM: none   GOALS: Goals reviewed with patient? Yes  SHORT TERM GOALS: Target date: 01/17/24  [name] wounds to be 100% granulated  Baseline: Goal status: INITIAL  2.  [name] edema to be gone from Rt LE  Baseline:  Goal status: INITIAL  3.  [name] pain to be no greater than a 2/10 in Rt LE  Baseline:  Goal status: INITIAL    LONG TERM GOALS: Target date: 02/21/23  [name] wounds to be healed  Baseline:  Goal status: INITIAL  2.  [name] to be wearing compression garment to assist in healing wounds and preventing future wounds.  Baseline:  Goal status: INITIAL  ASSESSMENT:  CLINICAL IMPRESSION: Patient is a 73 y.o. 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  His wounds have very little granulation tissue, are draining, and have a foul smell.  There is edema in his leg which he states is always present.  Mitchell [name] for debridement and appropriate dressing as the wounds go through the healing process.  He will also benefit from a juxta fit garment to control his edema as he has been exercising and elevating his limb for over 6 months without benefit.     OBJECTIVE IMPAIRMENTS: increased edema, pain, and decreased skin integrity .   ACTIVITY LIMITATIONS: locomotion level  PARTICIPATION LIMITATIONS: community activity  PERSONAL FACTORS: Age, Fitness, Time since onset of injury/illness/exacerbation, and 3+ comorbidities: diabetic, decreased circulation, smoker are also affecting patient's functional outcome.   REHAB POTENTIAL: Fair    CLINICAL DECISION MAKING: Evolving/moderate complexity  EVALUATION COMPLEXITY: Moderate  PLAN: [name] FREQUENCY: 2x/week  [name]  DURATION: 10 weeks  PLANNED INTERVENTIONS: 97535- Self Care, 02859- Manual therapy, 97597- Wound care (first 20 sq cm), and Patient/Family education  PLAN FOR NEXT SESSION: Continue with debridement dressing change   Mitchell [name], [name] CLT [phone]  12/11/2023, 11:14 AM

## 2023-12-17 ENCOUNTER — Ambulatory Visit (HOSPITAL_COMMUNITY)

## 2023-12-17 ENCOUNTER — Telehealth (HOSPITAL_COMMUNITY): Payer: Self-pay

## 2023-12-17 NOTE — Telephone Encounter (Signed)
 No show #1, called concerning missed apt today. Pt stated he was takikng nap and overslept, missed the apt. Reminded next apt date and time wiht contact number included if needs to cancel or reschedule in the future. Educated on no show policy.   Augustin Mclean, LPTA/CLT; WILLAIM [phone]

## 2023-12-20 ENCOUNTER — Telehealth (HOSPITAL_COMMUNITY): Payer: Self-pay | Admitting: Physical Therapy

## 2023-12-20 ENCOUNTER — Ambulatory Visit (HOSPITAL_COMMUNITY): Admitting: Physical Therapy

## 2023-12-20 ENCOUNTER — Ambulatory Visit (HOSPITAL_COMMUNITY): Attending: Podiatry | Admitting: Physical Therapy

## 2023-12-20 DIAGNOSIS — M79661 Pain in right lower leg: Secondary | ICD-10-CM | POA: Insufficient documentation

## 2023-12-20 DIAGNOSIS — R600 Localized edema: Secondary | ICD-10-CM | POA: Insufficient documentation

## 2023-12-20 DIAGNOSIS — S81801D Unspecified open wound, right lower leg, subsequent encounter: Secondary | ICD-10-CM | POA: Diagnosis present

## 2023-12-20 NOTE — Therapy (Signed)
 OUTPATIENT PHYSICAL THERAPY Wound Treatment   Patient Name: Mitchell [name] MRN: [id number] DOB:[birth year], 73 y.o., male Today's Date: 12/20/2023   PCP: Eligio Fairly REFERRING PROVIDER: Roddie Bring, DPM  END OF SESSION:  PT End of Session - 12/20/23 1350     Visit Number 2    Number of Visits 24    Date for Recertification  02/21/24    Authorization Type [employer]    PT Start Time 1251    PT Stop Time 1342    PT Time Calculation (min) 51 min    Activity Tolerance Patient tolerated treatment well    Behavior During Therapy [facility] for tasks assessed/performed          Past Medical History:  Diagnosis Date   Arthritis    Asthma    Cellulitis of right lower extremity 05/09/2014   Diabetes mellitus without complication (HCC)    Diabetic foot ulcer (HCC) 07/07/2014   Status post bedside debridement of multiple right plantar ulcers by podiatrist, Dr. Macie.   Hypercholesterolemia    IDA (iron deficiency anemia)    Maggot infestation    right foot ulcer   Obesity 05/09/2014   Pneumonia    Tobacco abuse 07/07/2014   Past Surgical History:  Procedure Laterality Date   APPENDECTOMY     APPLICATION OF WOUND VAC Right 10/27/2020   Procedure: APPLICATION OF WOUND VAC;  Surgeon: Serene Gaile ORN, MD;  Location: [facility] OR;  Service: Vascular;  Laterality: Right;   ESOPHAGOGASTRODUODENOSCOPY (EGD) WITH PROPOFOL  N/A 10/28/2020   Procedure: ESOPHAGOGASTRODUODENOSCOPY (EGD) WITH PROPOFOL ;  Surgeon: Federico Rosario BROCKS, MD;  Location: [facility] ENDOSCOPY;  Service: Gastroenterology;  Laterality: N/A;   HEMOSTASIS CLIP PLACEMENT  10/28/2020   Procedure: HEMOSTASIS CLIP PLACEMENT;  Surgeon: Federico Rosario BROCKS, MD;  Location[facility] ENDOSCOPY;  Service: Gastroenterology;;   HOT HEMOSTASIS N/A 10/28/2020   Procedure: HOT HEMOSTASIS (ARGON PLASMA COAGULATION/BICAP);  Surgeon: Federico Rosario BROCKS, MD;  Location: [facility] ENDOSCOPY;  Service: Gastroenterology;  Laterality: N/A;   MICROLARYNGOSCOPY Bilateral 03/02/2022    Procedure: MICROLARYNGOSCOPY AND BIOPSY;  Surgeon: Llewellyn Gerard LABOR, DO;  Location: [facility] OR;  Service: ENT;  Laterality: Bilateral;   PERIPHERAL VASCULAR CATHETERIZATION N/A 07/22/2015   Procedure: Abdominal Aortogram w/Lower Extremity;  Surgeon: Carlin FORBES Haddock, MD;  Location: [facility] INVASIVE CV LAB;  Service: Cardiovascular;  Laterality: N/A;   TRANSMETATARSAL AMPUTATION Right 10/27/2020   Procedure: TRANSMETATARSAL AMPUTATION;  Surgeon: Serene Gaile ORN, MD;  Location: [facility] OR;  Service: Vascular;  Laterality: Right;   Patient Active Problem List   Diagnosis Date Noted   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/02/2022   Foot osteomyelitis, right (HCC) 10/25/2020   GI bleed 10/25/2020   Blood loss anemia 10/25/2020   Osteomyelitis of right foot (HCC) 10/25/2020   Closed fracture of proximal end of right humerus with routine healing 02/25/2019   Pre-ulcerative calluses 04/20/2018   Myositis 07/08/2014   Diabetic foot ulcer associated with type 2 diabetes mellitus (HCC) 07/07/2014   Tobacco abuse 07/07/2014   Diabetic ulcer of right foot (HCC) 05/12/2014   Pulse weakness    Peripheral neuropathy    Cellulitis of right lower extremity 05/09/2014   Obesity 05/09/2014   Cellulitis of right lower leg 05/09/2014   Hyperglycemia 03/21/2013   Type 2 diabetes mellitus with foot ulcer (HCC) 03/21/2013   Noncompliance with medications due to cost issues 03/21/2013    ONSET DATE: PT has had wounds with this leg for years with a transmetatarsal amputation in 2022.  Pt states current wounds have  been there for 4 weeks.   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  Pain Assessment  Pain Scale 0-10  Pain Score 5  Pain Type Chronic pain  Pain Location Foot  Pain Orientation Right;Lower  Evaluation and Treatment  Evaluation and Treatment Procedures Explained to Patient/Family Yes  Evaluation and Treatment Procedures agreed to  Wound 12/11/23 1010 Vascular Ulcer Leg Distal;Right;Lateral  Date First Assessed/Time First Assessed: 12/11/23 1010   Present on Original Admission: Yes  Primary Wound Type: Vascular Ulcer  Secondary Wound Type - Vascular Ulcer: Venous  Location: Leg  Location Orientation: Distal;Right;Lateral  Site / Wound Assessment Dusky (no granulation)  Peri-wound Assessment Edema;Erythema (blanchable)  Drainage Description Serous;Odor - foul  Drainage Amount Moderate  Treatments Cleansed;Moisturizing cream;Site care;Other (Comment) (debridement with forceps)  Dressing Type Gauze (Comment)  Dressing Changed Changed  Dressing Status Old drainage  Wound 12/11/23 1020 Vascular Ulcer Pretibial Distal;Right;Medial  Date First Assessed/Time First Assessed: 12/11/23 1020   Present on Original Admission: Yes  Primary Wound Type: Vascular Ulcer  Secondary Wound Type - Vascular Ulcer: Venous  Location: Pretibial  Location Orientation: Distal;Right;Medial  Site / Wound Assessment Yellow (0% granulation)  Peri-wound Assessment Edema;Erythema (blanchable)  Drainage Description Odor - foul;Serous  Drainage Amount Moderate  Dressing Type Gauze (Comment)  Dressing Status Old drainage  Wound 12/11/23 1030 Neuropathic  Ulcer Foot Right  Date First Assessed/Time First Assessed: 12/11/23 1030   Present on Original Admission: Yes  Primary Wound Type: Neuropathic Ulcer  Location: Foot  Location Orientation: Right  Site / Wound Assessment Other (Comment);Dusky   Peri-wound Assessment   Wound Length (cm) 3 cm  Wound Width (cm) 3 cm  Wound Surface Area (cm^2) 7.07 cm^2  Wound Depth (cm)  (an area in the center that is 1x1cm that has a depth of at least 2 cm .)  Drainage Description Serosanguineous;Odor - foul  Drainage Amount Moderate  Treatments Cleansed;Moisturizing cream;Site care;Other (Comment)  Dressing Type Gauze (Comment)  Dressing Changed Changed  Dressing Status Old drainage  % Wound base Red or Granulating 95%  % Wound base Yellow/Fibrinous Exudate 5%  Tunneling (cm) center of wound  Undermining (cm) throughout wound  Wound Therapy - Assess/Plan/Recommendations  Wound Therapy - Clinical Statement see below  Wound Therapy - Functional Problem List see below  Factors Delaying/Impairing Wound Healing Diabetes Mellitus;Infection - systemic/local;Multiple medical problems;Polypharmacy;Tobacco use;Vascular compromise  Hydrotherapy Plan Debridement;Dressing change;Patient/family education;Other (comment) (manual)  Wound Therapy - Frequency 2X / week  Wound Therapy - Current Recommendations PT  Wound Plan cleanse, moisturize, debride and proper dressing  Wound Therapy  Dressing  vaseline to LE, silverhydrofiber to wounds followed by 4x4 and profore lite compression       PATIENT EDUCATION: Education details: The importance of quitting smoking, keeping blood sugars near 100 and increasing water and protein  intake for wounds to heal.  Person educated: Patient Education method: Explanation Education comprehension: verbalized understanding   HOME EXERCISE PROGRAM: none   GOALS: Goals reviewed with patient? Yes  SHORT TERM GOALS: Target date: 01/17/24  PT wounds to be 100% granulated   Baseline: Goal status: IN PROGRESS  2.  PT edema to be gone from Rt LE  Baseline:  Goal status: IN PROGRESS  3.  PT pain to be no greater than a 2/10 in Rt LE  Baseline:  Goal status: IN PROGRESS    LONG TERM GOALS: Target date: 02/21/23  PT wounds to be healed  Baseline:  Goal status: IN PROGRESS  2.  PT to be wearing compression garment to assist in healing wounds and preventing future wounds.  Baseline:  Goal status: IN PROGRESS  ASSESSMENT:  CLINICAL IMPRESSION: Patient is a 73 y.o. 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  His wounds continue to have very little granulation tissue, are draining, and have a foul smell. The most problematic of the three wounds is the plantar wound of his Rt foot.  There is an area of one centimeter circumference in the center of this wound that tunnels inward for at least 2 cm.  There is edema in his leg which he states is always present. The therapist recommended going to the ER, pt refused.   Therapist called Dr. Maree and requested antibiotics and x-ray.   Dr. Maree requested Mitchell [name] to come to his office.  By this time Mitchell [name] had left, therapist called Mitchell [name] number and relayed the message.  Mitchell [name] will continue to benefit from skilled PT for debridement and appropriate dressing as the wounds go through the healing process.  He will also benefit from a juxta fit garment to control his edema as he has been exercising and elevating his limb for over 6 months without benefit.     OBJECTIVE IMPAIRMENTS: increased edema, pain, and decreased skin integrity .   ACTIVITY LIMITATIONS: locomotion level  PARTICIPATION LIMITATIONS: community activity  PERSONAL FACTORS: Age, Fitness, Time since onset of injury/illness/exacerbation, and 3+ comorbidities: diabetic, decreased circulation, smoker are also affecting patient's  functional outcome.   REHAB POTENTIAL: Fair    CLINICAL DECISION MAKING: Evolving/moderate complexity  EVALUATION COMPLEXITY: Moderate  PLAN: PT FREQUENCY: 2x/week  PT DURATION: 10 weeks  PLANNED INTERVENTIONS: 97535- Self Care, 02859- Manual therapy, 97597- Wound care (first 20 sq cm), and Patient/Family education  PLAN FOR NEXT SESSION: Continue with debridement dressing change   Montie Metro, PT CLT [phone]  12/20/2023, 1:56 PM

## 2023-12-20 NOTE — Telephone Encounter (Signed)
 Called pt to offer him a 1:00 pm appointment as he said his ramp is icy this morning.  Pt agreed.  Montie Metro, PT CLT [phone]

## 2023-12-24 ENCOUNTER — Telehealth (HOSPITAL_COMMUNITY): Payer: Self-pay | Admitting: Physical Therapy

## 2023-12-24 ENCOUNTER — Ambulatory Visit (HOSPITAL_COMMUNITY): Admitting: Physical Therapy

## 2023-12-24 NOTE — Telephone Encounter (Signed)
 Second no show:  Called pt,  there was no answer.  Left message that his next appointment is Friday.  Also let pt know that I had a 4:00 pm appointment today and if he is interested in that appointment to call the office.  Montie Metro, PT CLT [phone]

## 2023-12-25 NOTE — Progress Notes (Signed)
 Romney Compean                                          MRN: [id number]   12/25/2023   The [facility] Quality Team Specialist reviewed this patient medical record for the purposes of chart review for care gap closure. The following were reviewed: chart review for care gap closure-colorectal cancer screening.    [facility] Quality Team

## 2023-12-26 ENCOUNTER — Other Ambulatory Visit: Payer: Self-pay

## 2023-12-26 ENCOUNTER — Inpatient Hospital Stay (HOSPITAL_COMMUNITY)
Admission: EM | Admit: 2023-12-26 | Discharge: 2024-01-02 | DRG: 253 | Disposition: A | Discharge status: Home Health | Attending: Internal Medicine | Admitting: Internal Medicine

## 2023-12-26 ENCOUNTER — Encounter (HOSPITAL_COMMUNITY): Payer: Self-pay

## 2023-12-26 ENCOUNTER — Emergency Department (HOSPITAL_COMMUNITY)

## 2023-12-26 DIAGNOSIS — Z794 Long term (current) use of insulin: Secondary | ICD-10-CM | POA: Diagnosis not present

## 2023-12-26 DIAGNOSIS — L97919 Non-pressure chronic ulcer of unspecified part of right lower leg with unspecified severity: Secondary | ICD-10-CM | POA: Diagnosis not present

## 2023-12-26 DIAGNOSIS — E1151 Type 2 diabetes mellitus with diabetic peripheral angiopathy without gangrene: Principal | ICD-10-CM | POA: Diagnosis present

## 2023-12-26 DIAGNOSIS — Z79899 Other long term (current) drug therapy: Secondary | ICD-10-CM

## 2023-12-26 DIAGNOSIS — Z23 Encounter for immunization: Secondary | ICD-10-CM | POA: Diagnosis present

## 2023-12-26 DIAGNOSIS — F17210 Nicotine dependence, cigarettes, uncomplicated: Secondary | ICD-10-CM | POA: Diagnosis present

## 2023-12-26 DIAGNOSIS — L97519 Non-pressure chronic ulcer of other part of right foot with unspecified severity: Secondary | ICD-10-CM | POA: Diagnosis present

## 2023-12-26 DIAGNOSIS — E7800 Pure hypercholesterolemia, unspecified: Secondary | ICD-10-CM | POA: Diagnosis present

## 2023-12-26 DIAGNOSIS — Z7985 Long-term (current) use of injectable non-insulin antidiabetic drugs: Secondary | ICD-10-CM | POA: Diagnosis not present

## 2023-12-26 DIAGNOSIS — Z720 Tobacco use: Secondary | ICD-10-CM | POA: Diagnosis not present

## 2023-12-26 DIAGNOSIS — Z716 Tobacco abuse counseling: Secondary | ICD-10-CM | POA: Diagnosis not present

## 2023-12-26 DIAGNOSIS — I10 Essential (primary) hypertension: Secondary | ICD-10-CM | POA: Diagnosis present

## 2023-12-26 DIAGNOSIS — Z880 Allergy status to penicillin: Secondary | ICD-10-CM

## 2023-12-26 DIAGNOSIS — E11621 Type 2 diabetes mellitus with foot ulcer: Secondary | ICD-10-CM | POA: Diagnosis present

## 2023-12-26 DIAGNOSIS — Z89421 Acquired absence of other right toe(s): Secondary | ICD-10-CM | POA: Diagnosis not present

## 2023-12-26 DIAGNOSIS — L03115 Cellulitis of right lower limb: Principal | ICD-10-CM

## 2023-12-26 DIAGNOSIS — Z7982 Long term (current) use of aspirin: Secondary | ICD-10-CM

## 2023-12-26 DIAGNOSIS — Z89411 Acquired absence of right great toe: Secondary | ICD-10-CM | POA: Diagnosis not present

## 2023-12-26 DIAGNOSIS — I70291 Other atherosclerosis of native arteries of extremities, right leg: Secondary | ICD-10-CM | POA: Diagnosis not present

## 2023-12-26 DIAGNOSIS — L97909 Non-pressure chronic ulcer of unspecified part of unspecified lower leg with unspecified severity: Secondary | ICD-10-CM | POA: Diagnosis not present

## 2023-12-26 DIAGNOSIS — E66812 Obesity, class 2: Secondary | ICD-10-CM | POA: Diagnosis present

## 2023-12-26 DIAGNOSIS — Z7984 Long term (current) use of oral hypoglycemic drugs: Secondary | ICD-10-CM

## 2023-12-26 DIAGNOSIS — I70234 Atherosclerosis of native arteries of right leg with ulceration of heel and midfoot: Secondary | ICD-10-CM | POA: Diagnosis not present

## 2023-12-26 DIAGNOSIS — L97414 Non-pressure chronic ulcer of right heel and midfoot with necrosis of bone: Secondary | ICD-10-CM | POA: Diagnosis not present

## 2023-12-26 DIAGNOSIS — E1169 Type 2 diabetes mellitus with other specified complication: Principal | ICD-10-CM | POA: Diagnosis present

## 2023-12-26 DIAGNOSIS — Z95820 Peripheral vascular angioplasty status with implants and grafts: Secondary | ICD-10-CM | POA: Diagnosis not present

## 2023-12-26 DIAGNOSIS — E1140 Type 2 diabetes mellitus with diabetic neuropathy, unspecified: Secondary | ICD-10-CM | POA: Diagnosis present

## 2023-12-26 DIAGNOSIS — M86471 Chronic osteomyelitis with draining sinus, right ankle and foot: Secondary | ICD-10-CM | POA: Diagnosis not present

## 2023-12-26 DIAGNOSIS — Z8249 Family history of ischemic heart disease and other diseases of the circulatory system: Secondary | ICD-10-CM

## 2023-12-26 DIAGNOSIS — M869 Osteomyelitis, unspecified: Secondary | ICD-10-CM | POA: Diagnosis present

## 2023-12-26 DIAGNOSIS — Z7902 Long term (current) use of antithrombotics/antiplatelets: Secondary | ICD-10-CM | POA: Diagnosis not present

## 2023-12-26 DIAGNOSIS — L97419 Non-pressure chronic ulcer of right heel and midfoot with unspecified severity: Secondary | ICD-10-CM | POA: Diagnosis not present

## 2023-12-26 DIAGNOSIS — M7989 Other specified soft tissue disorders: Secondary | ICD-10-CM | POA: Diagnosis not present

## 2023-12-26 DIAGNOSIS — Z6835 Body mass index (BMI) 35.0-35.9, adult: Secondary | ICD-10-CM

## 2023-12-26 DIAGNOSIS — M86171 Other acute osteomyelitis, right ankle and foot: Secondary | ICD-10-CM | POA: Diagnosis not present

## 2023-12-26 DIAGNOSIS — I739 Peripheral vascular disease, unspecified: Secondary | ICD-10-CM | POA: Diagnosis not present

## 2023-12-26 DIAGNOSIS — G629 Polyneuropathy, unspecified: Secondary | ICD-10-CM

## 2023-12-26 LAB — COMPREHENSIVE METABOLIC PANEL WITH GFR
ALT: 16 U/L (ref 0–44)
AST: 21 U/L (ref 15–41)
Albumin: 3.8 g/dL (ref 3.5–5.0)
Alkaline Phosphatase: 109 U/L (ref 38–126)
Anion gap: 13 (ref 5–15)
BUN: 13 mg/dL (ref 8–23)
CO2: 27 mmol/L (ref 22–32)
Calcium: 9.1 mg/dL (ref 8.9–10.3)
Chloride: 102 mmol/L (ref 98–111)
Creatinine, Ser: 0.94 mg/dL (ref 0.61–1.24)
GFR, Estimated: >60 mL/min (ref >60)
Glucose, Bld: 48 mg/dL — ABNORMAL LOW (ref 70–99)
Potassium: 4.5 mmol/L (ref 3.5–5.1)
Sodium: 142 mmol/L (ref 135–145)
Total Bilirubin: 0.4 mg/dL (ref 0.0–1.2)
Total Protein: 7.8 g/dL (ref 6.5–8.1)

## 2023-12-26 LAB — CBC WITH DIFFERENTIAL/PLATELET
Abs Immature Granulocytes: 0.05 K/uL (ref 0.00–0.07)
Basophils Absolute: 0.1 K/uL (ref 0.0–0.1)
Basophils Relative: 1 %
Eosinophils Absolute: 0.2 K/uL (ref 0.0–0.5)
Eosinophils Relative: 2 %
HCT: 45.5 % (ref 39.0–52.0)
Hemoglobin: 13.9 g/dL (ref 13.0–17.0)
Immature Granulocytes: 0 %
Lymphocytes Relative: 11 %
Lymphs Abs: 1.4 K/uL (ref 0.7–4.0)
MCH: 27.6 pg (ref 26.0–34.0)
MCHC: 30.5 g/dL (ref 30.0–36.0)
MCV: 90.3 fL (ref 80.0–100.0)
Monocytes Absolute: 1 K/uL (ref 0.1–1.0)
Monocytes Relative: 8 %
Neutro Abs: 10.1 K/uL — ABNORMAL HIGH (ref 1.7–7.7)
Neutrophils Relative %: 78 %
Platelets: 305 K/uL (ref 150–400)
RBC: 5.04 MIL/uL (ref 4.22–5.81)
RDW: 14.3 % (ref 11.5–15.5)
WBC: 12.9 K/uL — ABNORMAL HIGH (ref 4.0–10.5)
nRBC: 0 % (ref 0.0–0.2)

## 2023-12-26 LAB — LACTIC ACID, PLASMA: Lactic Acid, Venous: 1.6 mmol/L (ref 0.5–1.9)

## 2023-12-26 LAB — GLUCOSE, CAPILLARY: Glucose-Capillary: 91 mg/dL (ref 70–99)

## 2023-12-26 LAB — CBG MONITORING, ED
Glucose-Capillary: 54 mg/dL — ABNORMAL LOW (ref 70–99)
Glucose-Capillary: 135 mg/dL — ABNORMAL HIGH (ref 70–99)
Glucose-Capillary: 113 mg/dL — ABNORMAL HIGH (ref 70–99)

## 2023-12-26 MED ORDER — INSULIN ASPART 100 UNIT/ML IJ SOLN
0.0000 [IU] | Freq: Every day | INTRAMUSCULAR | Status: DC
  Administered 2023-12-30: 22:00:00 2 [IU] via SUBCUTANEOUS
  Filled 2023-12-26 (×2): qty 2

## 2023-12-26 MED ORDER — ACETAMINOPHEN 325 MG PO TABS
650.0000 mg | ORAL_TABLET | Freq: Four times a day (QID) | ORAL | Status: DC | PRN
  Administered 2023-12-27: 650 mg via ORAL
  Filled 2023-12-26: qty 2

## 2023-12-26 MED ORDER — NICOTINE 21 MG/24HR TD PT24
21.0000 mg | MEDICATED_PATCH | Freq: Once | TRANSDERMAL | Status: AC
  Administered 2023-12-26: 21 mg via TRANSDERMAL
  Filled 2023-12-26: qty 1

## 2023-12-26 MED ORDER — POLYETHYLENE GLYCOL 3350 17 G PO PACK: 17.0000 g | PACK | Freq: Every day | ORAL | Status: DC | PRN

## 2023-12-26 MED ORDER — GLUCOSE 40 % PO GEL
2.0000 | Freq: Once | ORAL | Status: AC
  Administered 2023-12-26: 62 g via ORAL
  Filled 2023-12-26: qty 2.42

## 2023-12-26 MED ORDER — ACETAMINOPHEN 650 MG RE SUPP: 650.0000 mg | Freq: Four times a day (QID) | RECTAL | Status: DC | PRN

## 2023-12-26 MED ORDER — LORAZEPAM 2 MG/ML IJ SOLN
0.5000 mg | Freq: Once | INTRAMUSCULAR | Status: AC
  Administered 2023-12-26: 0.5 mg via INTRAVENOUS
  Filled 2023-12-26: qty 1

## 2023-12-26 MED ORDER — ONDANSETRON HCL 4 MG PO TABS: 4.0000 mg | ORAL_TABLET | Freq: Four times a day (QID) | ORAL | Status: DC | PRN

## 2023-12-26 MED ORDER — SODIUM CHLORIDE 0.9 % IV SOLN
2.0000 g | Freq: Once | INTRAVENOUS | Status: AC
  Administered 2023-12-26: 2 g via INTRAVENOUS
  Filled 2023-12-26: qty 20

## 2023-12-26 MED ORDER — ROSUVASTATIN CALCIUM 5 MG PO TABS
5.0000 mg | ORAL_TABLET | Freq: Every day | ORAL | Status: DC
  Administered 2023-12-27 – 2024-01-01 (×5): 5 mg via ORAL
  Filled 2023-12-26 (×6): qty 1

## 2023-12-26 MED ORDER — INSULIN ASPART 100 UNIT/ML IJ SOLN
0.0000 [IU] | Freq: Three times a day (TID) | INTRAMUSCULAR | Status: DC
  Administered 2023-12-28 – 2023-12-29 (×3): 2 [IU] via SUBCUTANEOUS
  Administered 2023-12-31: 14:00:00 3 [IU] via SUBCUTANEOUS
  Administered 2023-12-31 – 2024-01-01 (×2): 2 [IU] via SUBCUTANEOUS
  Administered 2024-01-01: 12:00:00 5 [IU] via SUBCUTANEOUS
  Administered 2024-01-02: 09:00:00 1 [IU] via SUBCUTANEOUS
  Administered 2024-01-02: 13:00:00 2 [IU] via SUBCUTANEOUS
  Filled 2023-12-26 (×3): qty 2
  Filled 2023-12-26: qty 3
  Filled 2023-12-26: qty 5
  Filled 2023-12-26 (×3): qty 2
  Filled 2023-12-26: qty 1

## 2023-12-26 MED ORDER — ONDANSETRON HCL 4 MG/2ML IJ SOLN: 4.0000 mg | Freq: Four times a day (QID) | INTRAMUSCULAR | Status: DC | PRN

## 2023-12-26 MED ORDER — VANCOMYCIN HCL IN DEXTROSE 1-5 GM/200ML-% IV SOLN
1000.0000 mg | Freq: Once | INTRAVENOUS | Status: AC
  Administered 2023-12-26: 1000 mg via INTRAVENOUS
  Filled 2023-12-26: qty 200

## 2023-12-26 MED ORDER — METRONIDAZOLE 500 MG/100ML IV SOLN
500.0000 mg | Freq: Once | INTRAVENOUS | Status: AC
  Administered 2023-12-26: 500 mg via INTRAVENOUS
  Filled 2023-12-26: qty 100

## 2023-12-26 MED ORDER — ENOXAPARIN SODIUM 60 MG/0.6ML IJ SOSY
60.0000 mg | PREFILLED_SYRINGE | Freq: Every day | INTRAMUSCULAR | Status: DC
  Administered 2023-12-26 – 2024-01-01 (×7): 60 mg via SUBCUTANEOUS
  Filled 2023-12-26 (×8): qty 0.6

## 2023-12-26 MED ADMIN — Gabapentin Cap 300 MG: 600 mg | ORAL | NDC 16714066201

## 2023-12-26 MED FILL — Gabapentin Cap 300 MG: 600.0000 mg | ORAL | Qty: 2 | Status: AC

## 2023-12-26 MED FILL — Aspirin Tab Delayed Release 81 MG: 81.0000 mg | ORAL | Qty: 1 | Status: AC

## 2023-12-26 MED FILL — Gabapentin Cap 300 MG: 600.0000 mg | ORAL | Qty: 2 | Status: CN

## 2023-12-26 NOTE — ED Triage Notes (Signed)
 Pt arrived via POV from home c/o on-going RLE wounds and reports there is a hole under his foot now. Pt reports seeing his PCP and following up with Rehab and another specialist, but reports no one is helping him. Pt reports he came to [facility] today because he wants help.

## 2023-12-26 NOTE — H&P (Addendum)
 History and Physical    Mitchell [name] FMW:[id number] DOB: [birth year] DOA: 12/26/2023  PCP: Maree Isles, MD   Patient coming from: Home  I have personally briefly reviewed patient's old medical records in [facility]  Chief Complaint: left leg pain and swelling  HPI: Mitchell [name] is a 73 y.o. male with medical history significant for diabetes mellitus, right ray amputation of fourth patient presented to the ED with complaints of chronic ulcer to his right foot for which he followed up with his outpatient providers.  Over the past week reports increasing redness and worsening pain to his foot extending up to his knees. Reports he was on antibiotics- clindamycin  per med list, recently for the ulcer to his foot, but it did not improve. No Fevers no chills.  No vomiting. He has a chronic cough that is unchanged from baseline.  ED Course: Temperature 98.5.  Stable vitals.  WBC 12.9.  Lactic acid 1.6. Right foot x-ray-soft tissue swelling of the stump without wound or ulceration distally, small erosions at cuboid and medial/middle cuneiform osteomyelitis not excluded. Blood cultures obtained. IV vancomycin , ceftriaxone  2 g and metronidazole  started.  Review of Systems: As per HPI all other systems reviewed and negative.  Past Medical History:  Diagnosis Date   Arthritis    Asthma    Cellulitis of right lower extremity 05/09/2014   Diabetes mellitus without complication (HCC)    Diabetic foot ulcer (HCC) 07/07/2014   Status post bedside debridement of multiple right plantar ulcers by podiatrist, Dr. Macie.   Hypercholesterolemia    IDA (iron deficiency anemia)    Maggot infestation    right foot ulcer   Obesity 05/09/2014   Pneumonia    Tobacco abuse 07/07/2014    Past Surgical History:  Procedure Laterality Date   APPENDECTOMY     APPLICATION OF WOUND VAC Right 10/27/2020   Procedure: APPLICATION OF WOUND VAC;  Surgeon: Serene Gaile ORN, MD;  Location: [facility] OR;  Service: Vascular;   Laterality: Right;   ESOPHAGOGASTRODUODENOSCOPY (EGD) WITH PROPOFOL  N/A 10/28/2020   Procedure: ESOPHAGOGASTRODUODENOSCOPY (EGD) WITH PROPOFOL ;  Surgeon: Federico Rosario BROCKS, MD;  Location: [facility] ENDOSCOPY;  Service: Gastroenterology;  Laterality: N/A;   HEMOSTASIS CLIP PLACEMENT  10/28/2020   Procedure: HEMOSTASIS CLIP PLACEMENT;  Surgeon: Federico Rosario BROCKS, MD;  Location: [facility] ENDOSCOPY;  Service: Gastroenterology;;   HOT HEMOSTASIS N/A 10/28/2020   Procedure: HOT HEMOSTASIS (ARGON PLASMA COAGULATION/BICAP);  Surgeon: Federico Rosario BROCKS, MD;  Location: [facility] ENDOSCOPY;  Service: Gastroenterology;  Laterality: N/A;   MICROLARYNGOSCOPY Bilateral 03/02/2022   Procedure: MICROLARYNGOSCOPY AND BIOPSY;  Surgeon: Llewellyn Gerard LABOR, DO;  Location: [facility] OR;  Service: ENT;  Laterality: Bilateral;   PERIPHERAL VASCULAR CATHETERIZATION N/A 07/22/2015   Procedure: Abdominal Aortogram w/Lower Extremity;  Surgeon: Carlin FORBES Haddock, MD;  Location: [facility] INVASIVE CV LAB;  Service: Cardiovascular;  Laterality: N/A;   TRANSMETATARSAL AMPUTATION Right 10/27/2020   Procedure: TRANSMETATARSAL AMPUTATION;  Surgeon: Serene Gaile ORN, MD;  Location: [facility] OR;  Service: Vascular;  Laterality: Right;     reports that he has been smoking cigarettes. He has never used smokeless tobacco. He reports current alcohol  use. He reports that he does not use drugs.  Allergies[1]  Family history of hypertension.  Prior to Admission medications  Medication Sig Start Date End Date Taking? Authorizing Provider  aspirin  EC 81 MG tablet Take 81 mg by mouth daily.   Yes [provider]  cholecalciferol (VITAMIN D3) 25 MCG (1000 UNIT) tablet Take 1,000 Units by mouth  daily.   Yes [provider]  clindamycin  (CLEOCIN ) 300 MG capsule Take 300 mg by mouth 3 (three) times daily.   Yes [provider]  ferrous sulfate  325 (65 FE) MG tablet Take 1 tablet (325 mg total) by mouth daily with breakfast. 12/06/20  Yes Federico Rosario BROCKS, MD  FIASP   FLEXTOUCH 100 UNIT/ML FlexTouch Pen INJECT 60-100 UNITS :INJECT 20 UNITS SUBCUTANEOUSLY IN THE MORNING, 10 UNITS AT NOON, AND 20 UNITS IN THE EVENING; MAX DAILY DOSE:100 UNITS   Yes [provider]  furosemide  (LASIX ) 20 MG tablet Take 1 tablet (20 mg total) by mouth daily. 03/22/13  Yes Jadine Toribio SQUIBB, MD  gabapentin  (NEURONTIN ) 600 MG tablet Take 600 mg by mouth 3 (three) times daily. 11/11/23  Yes [provider]  JARDIANCE  25 MG TABS tablet Take 25 mg by mouth daily.  05/12/18  Yes [provider]  OZEMPIC, 2 MG/DOSE, 8 MG/3ML SOPN Inject 2 mg into the skin every Friday.   Yes [provider]  rosuvastatin (CRESTOR) 5 MG tablet Take 5 mg by mouth daily. 02/07/22  Yes [provider]  TRESIBA FLEXTOUCH 200 UNIT/ML SOPN Inject 10-20 Units into the skin See admin instructions. Take 20 units in the morning and night and 10 in the afternoon 06/24/15  Yes [provider]    Physical Exam: Vitals:   12/26/23 1530 12/26/23 1545 12/26/23 1600 12/26/23 1923  BP: (!) 159/57 (!) 156/62 (!) 149/56   Pulse: 65 71 68   Resp:      Temp:    98.3 F (36.8 C)  TempSrc:    Oral  SpO2: 96% 93% 93%   Weight:      Height:        Constitutional: NAD, calm, comfortable Vitals:   12/26/23 1530 12/26/23 1545 12/26/23 1600 12/26/23 1923  BP: (!) 159/57 (!) 156/62 (!) 149/56   Pulse: 65 71 68   Resp:      Temp:    98.3 F (36.8 C)  TempSrc:    Oral  SpO2: 96% 93% 93%   Weight:      Height:       Eyes: PERRL, lids and conjunctivae normal ENMT: Mucous membranes are moist.   Neck: normal, supple, no masses, no thyromegaly Respiratory: clear to auscultation bilaterally, no wheezing, no crackles. Normal respiratory effort. No accessory muscle use.  Cardiovascular: Regular rate and rhythm, no murmurs / rubs / gallops. No extremity edema.  Extremities warm. Abdomen: no tenderness, no masses palpated. No hepatosplenomegaly. Bowel sounds positive.   Musculoskeletal: no clubbing / cyanosis. No joint deformity upper and lower extremities.  Skin: Redness and swelling to right foot extending up towards towards knees, large chronic, foul-smelling ulcer to dorsal aspect of right foot, status post ray amputation.  Chronic stasis changes to bilateral lower extremity.   Neurologic: No facial asymmetry, moves EXTR spontaneously, speech fluent.  Psychiatric: Normal judgment and insight. Alert and oriented x 3. Normal mood.          Labs on Admission: I have personally reviewed following labs and imaging studies  CBC: Recent Labs  Lab 12/26/23 1550  WBC 12.9*  NEUTROABS 10.1*  HGB 13.9  HCT 45.5  MCV 90.3  PLT 305   Basic Metabolic Panel: Recent Labs  Lab 12/26/23 1550  NA 142  K 4.5  CL 102  CO2 27  GLUCOSE 48*  BUN 13  CREATININE 0.94  CALCIUM  9.1   GFR: Estimated Creatinine Clearance: 92.2 mL/min (by  C-G formula based on SCr of 0.94 mg/dL). Liver Function Tests: Recent Labs  Lab 12/26/23 1550  AST 21  ALT 16  ALKPHOS 109  BILITOT 0.4  PROT 7.8  ALBUMIN 3.8   CBG: Recent Labs  Lab 12/26/23 1519 12/26/23 1855  GLUCAP 54* 135*   Radiological Exams on Admission: DG Tibia/Fibula Right Result Date: 12/26/2023 CLINICAL DATA:  Cellulitis edema wound EXAM: RIGHT TIBIA AND FIBULA - 2 VIEW COMPARISON:  None Available. FINDINGS: Generalized soft tissue edema without radiopaque foreign body or emphysema. No definitive fracture or malalignment. No periostitis or osseous destructive change. IMPRESSION: Generalized soft tissue edema. No acute osseous abnormality. Electronically Signed   By: Luke Bun M.D.   On: 12/26/2023 16:07   DG Foot Complete Right Result Date: 12/26/2023 CLINICAL DATA:  Wound plantar surface of right foot cellulitis EXAM: RIGHT FOOT COMPLETE - 3+ VIEW COMPARISON:  10/25/2020 FINDINGS: Status post amputation of the right foot at the level of the TMT joints, small base of fifth metatarsal remnant  noted. Soft tissue swelling of the stump with wound or ulceration. No soft tissue gas. Age indeterminate possible small erosions at the cuboid and 1/2 cuneiform. IMPRESSION: Status post amputation of the right foot at the level of the TMT joints. Soft tissue swelling of the stump with wound or ulceration distally and along the plantar aspect. Age indeterminate possible small erosions at the cuboid and medial/middle cuneiform, osteomyelitis not excluded. This may be correlated with MRI if desired Electronically Signed   By: Luke Bun M.D.   On: 12/26/2023 16:07   ZXH:Wnwz.   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  Status right foot post ray amputation.  Afebrile, WBC 12.9.  Lactic acid 1.6.  Not meeting sepsis criteria.  X-ray with small erosions at the cuboid and medial/medial cuneiform, osteomyelitis not excluded.  IV vancomycin , ceftriaxone  and metronidazole  started in ED. - Obtain MRI - F/u Blood cultures - As he is well-appearing and not septic, hold off on further antibiotics for now, pending imaging findings and likely surgery evaluation.  Diabetes mellitus-blood sugars 48 > 54> 135.  He is on Tresiba 96 units daily - HgbA1c - SSI- S - Hold Ozempic, Jardiance ,  - Resume long-acting insulin  pending stable blood sugars  Ongoing tobacco abuse-smokes 1 pack of cigarettes daily. - Nicotine  patch   DVT prophylaxis: Lovenox  Code Status: Full Family Communication: None at bedside Disposition Plan: ~ 2 days Consults called: none Admission status:  Inpt med surg I certify that at the point of admission it is my clinical judgment that the patient will require inpatient hospital care spanning beyond 2 midnights from the point of  admission due to high intensity of service, high risk for further deterioration and high frequency of surveillance required.   Author: Tully FORBES Carwin, MD 12/26/2023 8:06 PM  For on call review [email or web address].      [1]  Allergies Allergen Reactions   Penicillins Itching, Dermatitis and Rash    Immediate rash, facial/tongue/throat swelling, SOB or lightheadedness with hypotension  Has tolerate multiple cephalosporins in the past

## 2023-12-26 NOTE — ED Notes (Signed)
 Pt attempting to get out of bed on his own with call bell in reach, this nurse helped pt back onto bed & onto bedpan. Pt has been alert & oriented, able to express needs verbally since this RN assumed care of pt. Pt has used urinal 3 times without assistance.

## 2023-12-26 NOTE — Progress Notes (Signed)
 PHARMACIST - PHYSICIAN COMMUNICATION  CONCERNING:  Enoxaparin  (Lovenox ) for DVT Prophylaxis    RECOMMENDATION: Patient was prescribed enoxaprin 40mg  q24 hours for VTE prophylaxis.   Filed Weights   12/26/23 1518  Weight: 117.9 kg (260 lb)    Body mass index is 35.76 kg/m.  Estimated Creatinine Clearance: 92.2 mL/min (by C-G formula based on SCr of 0.94 mg/dL).   Based on [facility] policy patient is candidate for enoxaparin  0.5mg /kg TBW SQ every 24 hours based on BMI being >30.  DESCRIPTION: Pharmacy has adjusted enoxaparin  dose per [facility] policy.  Patient is now receiving enoxaparin  60 mg every 24 hours    Annabella LOISE Banks, PharmD Clinical Pharmacist  12/26/2023 8:32 PM

## 2023-12-26 NOTE — ED Provider Notes (Signed)
 [place] EMERGENCY DEPARTMENT AT [facility] Provider Note   CSN: [id number] Arrival date & time: 12/26/23  1440     Patient presents with: Wound Check   Mitchell [name] is a 73 y.o. male.   Patient is a 73 year old male who presents emergency department the chief complaint of worsening swelling, redness to his right foot and lower extremity.  Patient notes that he is currently being followed by his primary care doctor as well as wound care on an outpatient basis.  He does arrive with a dressing in place over the lower extremity which she notes was changed approximately 1 week ago.  He notes that he has had no associated fever or chills.  He is already status post partial amputation of the right foot.  He denies any associated chest pain, shortness of breath, abdominal pain, nausea, vomiting, diarrhea.  He notes that he most recently stopped taking antibiotics a few days ago secondary to his foot.   Wound Check       Prior to Admission medications  Medication Sig Start Date End Date Taking? Authorizing Provider  aspirin  EC 81 MG tablet Take 81 mg by mouth daily.    [provider]  cholecalciferol (VITAMIN D3) 25 MCG (1000 UNIT) tablet Take 1,000 Units by mouth daily.    [provider]  clindamycin  (CLEOCIN ) 300 MG capsule Take 300 mg by mouth.    [provider]  ferrous sulfate  325 (65 FE) MG tablet Take 1 tablet (325 mg total) by mouth daily with breakfast. 12/06/20   Federico Rosario BROCKS, MD  FIASP  FLEXTOUCH 100 UNIT/ML FlexTouch Pen INJECT 60-100 UNITS :INJECT 20 UNITS SUBCUTANEOUSLY IN THE MORNING, 10 UNITS AT NOON, AND 20 UNITS IN THE EVENING; MAX DAILY DOSE:100 UNITS    [provider]  fluticasone (FLONASE) 50 MCG/ACT nasal spray Place 1 spray into both nostrils at bedtime. 10/10/21   [provider]  furosemide  (LASIX ) 20 MG tablet Take 1 tablet (20 mg total) by mouth daily. 03/22/13   Jadine Toribio SQUIBB, MD  gabapentin   (NEURONTIN ) 600 MG tablet Take 600 mg by mouth 3 (three) times daily. 11/11/23   [provider]  JARDIANCE  25 MG TABS tablet Take 25 mg by mouth daily.  05/12/18   [provider]  NOVOLOG  FLEXPEN 100 UNIT/ML FlexPen Inject 30 Units into the skin daily. 12/15/18   [provider]  OZEMPIC, 2 MG/DOSE, 8 MG/3ML SOPN Inject 2 mg into the skin once a week.    [provider]  polyethylene glycol (MIRALAX  / GLYCOLAX ) 17 g packet Take 17 g by mouth daily. Patient taking differently: Take 17 g by mouth daily as needed for mild constipation or moderate constipation. 11/05/20   Akula, Vijaya, MD  rosuvastatin  (CRESTOR ) 5 MG tablet Take 5 mg by mouth daily. 02/07/22   [provider]  TRESIBA FLEXTOUCH 200 UNIT/ML SOPN Inject 10-20 Units into the skin See admin instructions. Take 20 units in the morning and night and 10 in the afternoon 06/24/15   [provider]    Allergies: Penicillins    Review of Systems  Musculoskeletal:        Swelling, redness to right foot  All other systems reviewed and are negative.   Updated Vital Signs BP (!) 149/56   Pulse 68   Temp 98.5 F (36.9 C) (Oral)   Resp 19   Ht 5' 11.5 (1.816 m)   Wt 117.9 kg   SpO2 93%   BMI  35.76 kg/m   Physical Exam Vitals and nursing note reviewed.  Constitutional:      General: He is not in acute distress.    Appearance: Normal appearance. He is not ill-appearing.  HENT:     Head: Normocephalic and atraumatic.  Eyes:     Extraocular Movements: Extraocular movements intact.     Conjunctiva/sclera: Conjunctivae normal.     Pupils: Pupils are equal, round, and reactive to light.  Cardiovascular:     Rate and Rhythm: Normal rate and regular rhythm.     Pulses: Normal pulses.     Heart sounds: Normal heart sounds. No murmur heard.    No gallop.  Pulmonary:     Effort: Pulmonary effort is normal. No respiratory distress.     Breath sounds: Normal breath sounds. No  stridor. No wheezing, rhonchi or rales.  Abdominal:     General: Abdomen is flat. Bowel sounds are normal. There is no distension.     Palpations: Abdomen is soft.     Tenderness: There is no abdominal tenderness. There is no guarding.  Musculoskeletal:        General: Normal range of motion.     Cervical back: Normal range of motion and neck supple. No rigidity or tenderness.     Comments: Erythema noted over the right foot, ankle, right tib-fib region, ulcerated lesion noted to the plantar aspect of the right foot which is already status post partial amputation, ulcerated lesion noted along the lateral malleolus with purulent discharge, no obvious deformity  Skin:    General: Skin is warm and dry.  Neurological:     General: No focal deficit present.     Mental Status: He is alert and oriented to person, place, and time. Mental status is at baseline.  Psychiatric:        Mood and Affect: Mood normal.        Behavior: Behavior normal.        Thought Content: Thought content normal.        Judgment: Judgment normal.     (all labs ordered are listed, but only abnormal results are displayed) Labs Reviewed  CBC WITH DIFFERENTIAL/PLATELET - Abnormal; Notable for the following components:      Result Value   WBC 12.9 (*)    Neutro Abs 10.1 (*)    All other components within normal limits  CBG MONITORING, ED - Abnormal; Notable for the following components:   Glucose-Capillary 54 (*)    All other components within normal limits  CULTURE, BLOOD (ROUTINE X 2)  CULTURE, BLOOD (ROUTINE X 2)  COMPREHENSIVE METABOLIC PANEL WITH GFR  LACTIC ACID, PLASMA  LACTIC ACID, PLASMA    EKG: None  Radiology: DG Tibia/Fibula Right Result Date: 12/26/2023 CLINICAL DATA:  Cellulitis edema wound EXAM: RIGHT TIBIA AND FIBULA - 2 VIEW COMPARISON:  None Available. FINDINGS: Generalized soft tissue edema without radiopaque foreign body or emphysema. No definitive fracture or malalignment. No  periostitis or osseous destructive change. IMPRESSION: Generalized soft tissue edema. No acute osseous abnormality. Electronically Signed   By: Luke Bun M.D.   On: 12/26/2023 16:07   DG Foot Complete Right Result Date: 12/26/2023 CLINICAL DATA:  Wound plantar surface of right foot cellulitis EXAM: RIGHT FOOT COMPLETE - 3+ VIEW COMPARISON:  10/25/2020 FINDINGS: Status post amputation of the right foot at the level of the TMT joints, small base of fifth metatarsal remnant noted. Soft tissue swelling of the stump with wound or ulceration. No soft tissue gas.  Age indeterminate possible small erosions at the cuboid and 1/2 cuneiform. IMPRESSION: Status post amputation of the right foot at the level of the TMT joints. Soft tissue swelling of the stump with wound or ulceration distally and along the plantar aspect. Age indeterminate possible small erosions at the cuboid and medial/middle cuneiform, osteomyelitis not excluded. This may be correlated with MRI if desired Electronically Signed   By: Luke Bun M.D.   On: 12/26/2023 16:07     Procedures   Medications Ordered in the ED  vancomycin  (VANCOCIN ) IVPB 1000 mg/200 mL premix (1,000 mg Intravenous New Bag/Given 12/26/23 1609)  cefTRIAXone  (ROCEPHIN ) 2 g in sodium chloride  0.9 % 100 mL IVPB (2 g Intravenous New Bag/Given 12/26/23 1607)  metroNIDAZOLE  (FLAGYL ) IVPB 500 mg (500 mg Intravenous New Bag/Given 12/26/23 1606)  dextrose  (GLUTOSE) oral gel 40% (peds > 20kg and adults) (62 g Oral Given 12/26/23 1538)                                    Medical Decision Making Amount and/or Complexity of Data Reviewed Labs: ordered. Radiology: ordered.  Risk OTC drugs. Prescription drug management. Decision regarding hospitalization.   This patient presents to the ED for concern of redness, swelling, discharge from right foot and lower extremity, this involves an extensive number of treatment options, and is a complaint that carries with it a  high risk of complications and morbidity.  The differential diagnosis includes cellulitis, abscess, necrotizing fasciitis, osteomyelitis   Co morbidities that complicate the patient evaluation  Diabetes, hyperlipidemia, PAD   Additional history obtained:  Additional history obtained from medical records External records from outside source obtained and reviewed including medical records   Lab Tests:  I Ordered, and personally interpreted labs.  The pertinent results include: Mild leukocytosis, no anemia, normal kidney function liver function, unremarkable electrolytes, negative lactic acid   Imaging Studies ordered:  I ordered imaging studies including x-ray of right foot, x-ray right tib-fib I independently visualized and interpreted imaging which showed no acute osseous injury, erosions at the cuneiform and cuboid I agree with the radiologist interpretation    Consultations Obtained:  I requested consultation with the hospitalist,  and discussed lab and imaging findings as well as pertinent plan - they recommend: Admission   Problem List / ED Course / Critical interventions / Medication management  Patient is doing well at this time and does remain stable.  He does have stable vital signs at this point with no tachycardia or fever.  He does have a mild leukocytosis.  Lactic acid is normal.  He does not appear to be septic at this point.  He does have findings consistent with cellulitis to the right foot.  No areas of obvious drainable abscess at this point and low suspicion for necrotizing fasciitis.  Discussed with patient we will plan for admission to the hospitalist service at this time.  He has been given vancomycin , Rocephin , Flagyl .  Will hold IV fluids at this point as he does not appear to be septic or in shock.  Have discussed patient case with Dr. FORBES Carwin with the hospital service who has excepted for admission. I ordered medication including Rocephin , vancomycin ,  Flagyl  for cellulitis Reevaluation of the patient after these medicines showed that the patient improved I have reviewed the patients home medicines and have made adjustments as needed   Social Determinants of Health:  None  Test / Admission - Considered:  Admission     Final diagnoses:  None    ED Discharge Orders     None          Daralene Lonni BIRCH, PA-C 12/26/23 1730    Kammerer, Megan L, DO 12/31/23 [phone]

## 2023-12-27 ENCOUNTER — Inpatient Hospital Stay (HOSPITAL_COMMUNITY)

## 2023-12-27 ENCOUNTER — Ambulatory Visit (HOSPITAL_COMMUNITY): Admitting: Physical Therapy

## 2023-12-27 DIAGNOSIS — E11621 Type 2 diabetes mellitus with foot ulcer: Secondary | ICD-10-CM | POA: Diagnosis not present

## 2023-12-27 DIAGNOSIS — L97419 Non-pressure chronic ulcer of right heel and midfoot with unspecified severity: Secondary | ICD-10-CM | POA: Diagnosis not present

## 2023-12-27 LAB — BASIC METABOLIC PANEL WITH GFR
Anion gap: 7 (ref 5–15)
BUN: 12 mg/dL (ref 8–23)
CO2: 30 mmol/L (ref 22–32)
Calcium: 8.5 mg/dL — ABNORMAL LOW (ref 8.9–10.3)
Chloride: 103 mmol/L (ref 98–111)
Creatinine, Ser: 0.88 mg/dL (ref 0.61–1.24)
GFR, Estimated: >60 mL/min (ref >60)
Glucose, Bld: 63 mg/dL — ABNORMAL LOW (ref 70–99)
Potassium: 4.2 mmol/L (ref 3.5–5.1)
Sodium: 140 mmol/L (ref 135–145)

## 2023-12-27 LAB — CBC
HCT: 41.6 % (ref 39.0–52.0)
Hemoglobin: 13 g/dL (ref 13.0–17.0)
MCH: 28 pg (ref 26.0–34.0)
MCHC: 31.3 g/dL (ref 30.0–36.0)
MCV: 89.5 fL (ref 80.0–100.0)
Platelets: 275 K/uL (ref 150–400)
RBC: 4.65 MIL/uL (ref 4.22–5.81)
RDW: 14.3 % (ref 11.5–15.5)
WBC: 10.9 K/uL — ABNORMAL HIGH (ref 4.0–10.5)
nRBC: 0 % (ref 0.0–0.2)

## 2023-12-27 LAB — GLUCOSE, CAPILLARY
Glucose-Capillary: 75 mg/dL (ref 70–99)
Glucose-Capillary: 113 mg/dL — ABNORMAL HIGH (ref 70–99)
Glucose-Capillary: 130 mg/dL — ABNORMAL HIGH (ref 70–99)
Glucose-Capillary: 184 mg/dL — ABNORMAL HIGH (ref 70–99)

## 2023-12-27 LAB — HEMOGLOBIN A1C
Hgb A1c MFr Bld: 5.4 % (ref 4.8–5.6)
Mean Plasma Glucose: 108.28 mg/dL

## 2023-12-27 MED ORDER — VANCOMYCIN HCL 1250 MG/250ML IV SOLN: 1250.0000 mg | Freq: Two times a day (BID) | INTRAVENOUS | Status: DC

## 2023-12-27 MED ORDER — VANCOMYCIN HCL 2000 MG/400ML IV SOLN
2000.0000 mg | Freq: Once | INTRAVENOUS | Status: AC
  Administered 2023-12-27: 2000 mg via INTRAVENOUS
  Filled 2023-12-27: qty 400

## 2023-12-27 MED ORDER — VANCOMYCIN HCL IN DEXTROSE 1-5 GM/200ML-% IV SOLN
1000.0000 mg | Freq: Two times a day (BID) | INTRAVENOUS | Status: DC
  Administered 2023-12-28 – 2023-12-29 (×5): 1000 mg via INTRAVENOUS
  Filled 2023-12-27 (×6): qty 200

## 2023-12-27 MED ORDER — VANCOMYCIN HCL 2000 MG/400ML IV SOLN
2000.0000 mg | Freq: Once | INTRAVENOUS | Status: DC
  Filled 2023-12-27: qty 400

## 2023-12-27 MED ORDER — SODIUM CHLORIDE 0.9 % IV SOLN
2.0000 g | Freq: Three times a day (TID) | INTRAVENOUS | Status: DC
  Administered 2023-12-27 – 2024-01-01 (×15): 2 g via INTRAVENOUS
  Filled 2023-12-27 (×18): qty 12.5

## 2023-12-27 MED ADMIN — Gabapentin Cap 300 MG: 600 mg | ORAL | NDC 16714066202

## 2023-12-27 MED ADMIN — Gabapentin Cap 300 MG: 600 mg | ORAL | NDC 16714066201

## 2023-12-27 MED ADMIN — Aspirin Tab Delayed Release 81 MG: 81 mg | ORAL | NDC 10135072962

## 2023-12-27 NOTE — Care Management Important Message (Signed)
 Important Message  Patient Details  Name: Mitchell [name] MRN: [id number] Date of Birth: 09/26/1950   Important Message Given:  Yes - Medicare IM     Kincade Granberg L Sherrie Marsan 12/27/2023, 2:30 PM

## 2023-12-27 NOTE — Progress Notes (Addendum)
 PROGRESS NOTE    Mitchell [name]  FMW:[id number] DOB: 05/31/1950 DOA: 12/26/2023 PCP: Maree Isles, MD   Brief Narrative:   73 y.o. male with medical history significant for diabetes mellitus, right ray amputation of fourth patient presented to the ED with complaints of chronic ulcer to his right foot. Over the past week reports increasing redness and worsening pain to his foot extending up to his knees. Reports he was on antibiotics- clindamycin  per med list, recently for the ulcer to his foot, but it did not improve. Right foot x-ray-soft tissue swelling of the stump without wound or ulceration distally, small erosions at cuboid and medial/middle cuneiform osteomyelitis not excluded. Blood cultures obtained. IV vancomycin , ceftriaxone  2 g and metronidazole  started. MRI right foot shows findings suspicious for early osteomyelitis at the distal cuboid with adjacent plantar ulceration with subcutaneous gas. Vascular Surgeon Dr Serene consulted Needs to be transferred to [facility] for possible Rt BKA. On IV antibiotics.  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   -Received IV vancomycin , ceftriaxone  and metronidazole  in ED. - MRI right foot shows findings suspicious for early osteomyelitis at the distal cuboid with adjacent plantar ulceration with subcutaneous gas. -I have ordered IV Vanc and Cefepime for osteomyelitis - F/u Blood cultures - Discussed with vascular surgeon, Dr Serene. He will be transferred to [facility].  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 Serene   Diabetes mellitus type II-blood sugars 48 > 54> 135.   He is on Tresiba 96 units daily - HgbA1c - SSI- S - Hold Ozempic, Jardiance ,  - Hold off on long-acting insulin  initiation   Ongoing tobacco abuse-smokes 1 pack of cigarettes daily. - Nicotine  patch -Counseled extensively regarding tobacco cessation  Class II obesity, POA: As evidenced by BMI of 35.76.  He is likely a candidate for antiobesity medication based on presence of diabetes as well as diabetic complications.  Disposition: Lives at home by himself and is independent in activities of daily life    DVT prophylaxis: Lovenox      Code Status: Full Code Family Communication:  None at the bedside Status is: Inpatient Remains inpatient appropriate because: Foot osteomyelitis    Subjective:  He says that he feels fine.  He lives at home by himself and is independent in activities of daily life.  He just had the MRI of the foot done and we spoke about results and the need to transfer to [facility]. He told me about his history of right transmetatarsal amputation done in October 2022 by Dr. Serene  Examination:  General exam: Appears calm and comfortable  Respiratory system: Clear to auscultation. Respiratory effort normal. Cardiovascular system: S1 & S2 heard, RRR. No JVD, murmurs, rubs, gallops or clicks. No pedal edema. Gastrointestinal system: Abdomen is nondistended, soft and nontender. No organomegaly or masses felt. Normal bowel sounds heard. Central nervous system: Alert and oriented. No focal neurological deficits. Extremities/Skin: s/p Rt TMA,erythema of the remaining foot, ulceration over lateral aspect of the ankle, redness extends into the proximal leg, healed ulceration over plantar aspect Psychiatry: Judgement and insight appear normal. Mood & affect appropriate.  Diet Orders (From admission, onward)     Start     Ordered   12/26/23 1708  Diet heart healthy/carb modified Fluid consistency: Thin  Diet effective now       Question:  Fluid consistency:  Answer:   Thin   12/26/23 1707            Objective: Vitals:   12/26/23 2009 12/26/23 2100 12/26/23 2325 12/27/23 0452  BP:  (!) 158/63 122/78 (!) 143/55  Pulse:  76 77 76  Resp:  17 17 18   Temp:  98.3 F (36.8 C) 98.2 F (36.8 C) 97.8 F (36.6 C)  TempSrc:  Oral Oral Oral  SpO2: 94% 93% 94%   Weight:      Height:        Intake/Output Summary (Last 24 hours) at 12/27/2023 0913 Last data filed at 12/27/2023 0500 Gross per 24 hour  Intake 240 ml  Output 1080 ml  Net -840 ml   Filed Weights   12/26/23 1518  Weight: 117.9 kg    Scheduled Meds:  aspirin  EC  81 mg Oral Daily   enoxaparin  (LOVENOX ) injection  60 mg Subcutaneous QHS   gabapentin   600 mg Oral TID   insulin  aspart  0-5 Units Subcutaneous QHS   insulin  aspart  0-9 Units Subcutaneous TID WC   nicotine   21 mg Transdermal Once   rosuvastatin  5 mg Oral Daily   Continuous Infusions:  Nutritional status     Body mass index is 35.76 kg/m.  Data Reviewed:   CBC: Recent Labs  Lab 12/26/23 1550 12/27/23 0445  WBC 12.9* 10.9*  NEUTROABS 10.1*  --   HGB 13.9 13.0  HCT 45.5 41.6  MCV 90.3 89.5  PLT 305 275   Basic Metabolic Panel: Recent Labs  Lab 12/26/23 1550 12/27/23 0445  NA 142 140  K 4.5 4.2  CL 102 103  CO2 27 30  GLUCOSE 48* 63*  BUN 13 12  CREATININE 0.94 0.88  CALCIUM  9.1 8.5*   GFR: Estimated Creatinine Clearance: 98.4 mL/min (by C-G formula based on SCr of 0.88 mg/dL). Liver Function Tests: Recent Labs  Lab 12/26/23 1550  AST 21  ALT 16  ALKPHOS 109  BILITOT 0.4  PROT 7.8  ALBUMIN 3.8   No results for input(s): LIPASE, AMYLASE in the last 168 hours. No results for input(s): AMMONIA in the last 168 hours. Coagulation Profile: No results for input(s): INR, PROTIME in the last 168 hours. Cardiac Enzymes: No results for input(s): CKTOTAL, CKMB, CKMBINDEX, TROPONINI in the last 168 hours. BNP (last 3 results) No results for input(s): PROBNP in the last  8760 hours. HbA1C: No results for input(s): HGBA1C in the last 72 hours. CBG: Recent Labs  Lab 12/26/23 1519 12/26/23 1855 12/26/23 2108 12/26/23 2343 12/27/23 0725  GLUCAP 54* 135* 113* 91 75   Lipid Profile: No results for input(s): CHOL, HDL, LDLCALC, TRIG, CHOLHDL, LDLDIRECT in the last 72 hours. Thyroid  Function Tests: No results for input(s): TSH, T4TOTAL, FREET4, T3FREE, THYROIDAB in the last 72 hours. Anemia Panel: No results for input(s): VITAMINB12, FOLATE, FERRITIN, TIBC, IRON, RETICCTPCT in the last 72 hours. Sepsis Labs: Recent Labs  Lab 12/26/23 1550  LATICACIDVEN 1.6    No results found for this or any previous visit (from the past 240 hours).       Radiology Studies: MR FOOT RIGHT WO CONTRAST Result Date: 12/27/2023 EXAM: MRI of the right Hindfoot without contrast. 12/27/2023 08:23:30 AM TECHNIQUE: Multiplanar multisequence MRI  of the right hindfoot was performed without the administration of intravenous contrast. COMPARISON: Radiographs 12/26/2023 and MRI 10/26/2020. CLINICAL HISTORY: Possible osteomyelitis to right foot. Chronic diabetic foot ulcer with foul-smelling drainage. FINDINGS: LISFRANC JOINT: Amputation at the Lisfranc joint level. Mild irregularities of the distal articular surfaces of the cuneiforms and cuboid with low level edema along the irregular distal articular margins which is nonspecific but concerning for potential early osteomyelitis particularly in the cuboid given the adjacent plantar ulceration below the distal cuboid with tiny loculations of gas density in the underlying subcutaneous tissues for example on images 4 through 12 of series 7. Small adjacent likely degenerative subcortical cyst formation or erosions on the distal calcaneal head and adjacent proximal cuboid on image 11 series 8. This is more likely to be degenerative or due to mild erosive arthropathy. SOFT TISSUES: Circumferential  subcutaneous edema in the ankle tracking into the foot. Adjacent plantar ulceration below the distal cuboid with tiny loculations of gas density in the underlying subcutaneous tissues for example on images 4 through 12 of series 7. TENDONS: Distal discontinuity as expected in the setting of amputation. IMPRESSION: 1. Findings suspicious for early osteomyelitis at the distal cuboid with adjacent plantar ulceration with subcutaneous gas. 2. Prior amputation at the Lisfranc joint level. 3. Likely degenerative subcortical cystic change or mild erosive arthropathy at the distal calcaneal head and proximal cuboid. 4. Circumferential subcutaneous edema of the ankle extending into the foot. Cellulitis not excluded. Electronically signed by: Ryan Salvage MD 12/27/2023 08:39 AM EST RP Workstation: [id number]   DG Tibia/Fibula Right Result Date: 12/26/2023 CLINICAL DATA:  Cellulitis edema wound EXAM: RIGHT TIBIA AND FIBULA - 2 VIEW COMPARISON:  None Available. FINDINGS: Generalized soft tissue edema without radiopaque foreign body or emphysema. No definitive fracture or malalignment. No periostitis or osseous destructive change. IMPRESSION: Generalized soft tissue edema. No acute osseous abnormality. Electronically Signed   By: Luke Bun M.D.   On: 12/26/2023 16:07   DG Foot Complete Right Result Date: 12/26/2023 CLINICAL DATA:  Wound plantar surface of right foot cellulitis EXAM: RIGHT FOOT COMPLETE - 3+ VIEW COMPARISON:  10/25/2020 FINDINGS: Status post amputation of the right foot at the level of the TMT joints, small base of fifth metatarsal remnant noted. Soft tissue swelling of the stump with wound or ulceration. No soft tissue gas. Age indeterminate possible small erosions at the cuboid and 1/2 cuneiform. IMPRESSION: Status post amputation of the right foot at the level of the TMT joints. Soft tissue swelling of the stump with wound or ulceration distally and along the plantar aspect. Age indeterminate  possible small erosions at the cuboid and medial/middle cuneiform, osteomyelitis not excluded. This may be correlated with MRI if desired Electronically Signed   By: Luke Bun M.D.   On: 12/26/2023 16:07           LOS: 1 day   Time spent= 35 mins    Deliliah Room, MD [facility]  If 7PM-7AM, please contact night-coverage  12/27/2023, 9:13 AM

## 2023-12-27 NOTE — Progress Notes (Signed)
 Mobility Specialist Progress Note:    12/27/23 1005  Mobility  Activity Ambulated with assistance  Level of Assistance Standby assist, set-up cues, supervision of patient - no hands on  Assistive Device Front wheel walker  Distance Ambulated (ft) 15 ft  Range of Motion/Exercises Active;All extremities  Activity Response Tolerated well  Mobility Referral Yes  Mobility visit 1 Mobility  Mobility Specialist Start Time (ACUTE ONLY) 1005  Mobility Specialist Stop Time (ACUTE ONLY) 1025  Mobility Specialist Time Calculation (min) (ACUTE ONLY) 20 min   Pt received in bed, requesting assistance to bathroom. Required SBA to stand and ambulate with RW. Tolerated well, returned supine. All needs met.  Zahi Plaskett Mobility Specialist Please contact via Special Educational Needs Teacher or  Rehab office at [phone]

## 2023-12-27 NOTE — Plan of Care (Signed)
°  Problem: Education: Goal: Ability to describe self-care measures that may prevent or decrease complications (Diabetes Survival Skills Education) will improve Outcome: Progressing   Problem: Metabolic: Goal: Ability to maintain appropriate glucose levels will improve Outcome: Progressing   Problem: Skin Integrity: Goal: Risk for impaired skin integrity will decrease Outcome: Progressing   Problem: Education: Goal: Knowledge of General Education information will improve Description: Including pain rating scale, medication(s)/side effects and non-pharmacologic comfort measures Outcome: Progressing   Problem: Clinical Measurements: Goal: Ability to maintain clinical measurements within normal limits will improve Outcome: Progressing Goal: Will remain free from infection Outcome: Progressing   Problem: Pain Managment: Goal: General experience of comfort will improve and/or be controlled Outcome: Progressing   Problem: Safety: Goal: Ability to remain free from injury will improve Outcome: Progressing

## 2023-12-27 NOTE — Consult Note (Addendum)
 WOC Nurse Consult Note: patient with ulcerations to legs off and on for years with known PVD; had transmetatarsal amputation 2022 Followed by outpatient PT for wound care; last seen 12/20/2023, using silver and juxta lite compression  Reason for Consult: R foot and lower leg wounds  Wound type: 1. Full thickness R lateral foot/ankle dry necrotic tissue r/t venous insufficiency  2.  Full thickness R plantar foot red moist likely neuropathic ulcer  3.  Dry callus to area of prior transmetatarsal amputation  Pressure Injury POA: NA not pressure  Measurement:see nursing flowsheet  Wound bed: see nursing flowsheet  Drainage (amount, consistency, odor) purulent per MD note  Periwound:erythema; hyperkeratotic skin  Dressing procedure/placement/frequency: Cleanse R lateral leg and plantar foot wounds with Vashe, do not rinse. Apply Xeroform gauze to lateral leg/ankle wound.  Apply silver hydrofiber ([place] [phone]) to plantar foot wound.  Cover both with ABD pad and secure entire dressing with Kerlix roll gauze. May apply Ace bandage wrapped in same fashion as Kerlix for light compression.   X-rays concerning for osteomyelitis.  Vascular surgeon has been consulted. Any orders placed by vascular surgeon supercede wound care orders placed by [facility] RN.    POC discussed with bedside nurse. WOC team will not follow. Re-consult if further needs arise.   Thank you,    Powell Bar MSN, RN-BC, [employer]

## 2023-12-27 NOTE — Progress Notes (Signed)
 Pharmacy Antibiotic Note  Mitchell [name] is a 73 y.o. male admitted on 12/26/2023 with osteomyelitis.  Pharmacy has been consulted for vancomycin  and cefepime dosing.  Plan: Vancomycin  2000 mg IV loading dose, then 1000 Q 12 hrs. Goal AUC 400-550. Expected AUC: 474 SCr used: 0.88 Cefepime 2gm IV q8h F/U cxs and clinical progress Monitor V/S, labs and levels as indicated   Height: 5' 11.5 (181.6 cm) Weight: 117.9 kg (260 lb) IBW/kg (Calculated) : 76.45  Temp (24hrs), Avg:98.2 F (36.8 C), Min:97.8 F (36.6 C), Max:98.5 F (36.9 C)  Recent Labs  Lab 12/26/23 1550 12/27/23 0445  WBC 12.9* 10.9*  CREATININE 0.94 0.88  LATICACIDVEN 1.6  --     Estimated Creatinine Clearance: 98.4 mL/min (by C-G formula based on SCr of 0.88 mg/dL).    Allergies[1]  Antimicrobials this admission: Cefepime 12/12 >>  Vancomycin   12/11( 1 dose given in ED) continue >>  Ceftriaxone  12/11 x 1 dose in ED  Microbiology results: 12/11 ARk:whui  Thank you for allowing pharmacy to be a part of this patients care.  Eshaal Duby, BS Pharm D, BCPS Clinical Pharmacist 12/27/2023 11:02 AM     [1]  Allergies Allergen Reactions   Penicillins Itching, Dermatitis and Rash    Immediate rash, facial/tongue/throat swelling, SOB or lightheadedness with hypotension  Has tolerate multiple cephalosporins in the past

## 2023-12-27 NOTE — Plan of Care (Signed)

## 2023-12-27 NOTE — TOC CM/SW Note (Signed)
 Transition of Care [facility]) - Inpatient Brief Assessment   Patient Details  Name: Mitchell [name] MRN: [id number] Date of Birth: 1950/03/27  Transition of Care [facility]) CM/SW Contact:    Lucie Lunger, LCSWA Phone Number: 12/27/2023, 9:11 AM   Clinical Narrative: Transition of Care Department [facility]) has reviewed patient and no TOC needs have been identified at this time. We will continue to monitor patient advancement through interdiciplinary progression rounds. If new patient transition needs arise, please place a TOC consult.  Transition of Care Asessment: Insurance and Status: Insurance coverage has been reviewed Patient has primary care physician: Yes Home environment has been reviewed: From home Prior level of function:: Independent Prior/Current Home Services: No current home services Social Drivers of Health Review: SDOH reviewed no interventions necessary Readmission risk has been reviewed: Yes Transition of care needs: no transition of care needs at this time

## 2023-12-28 DIAGNOSIS — E7800 Pure hypercholesterolemia, unspecified: Secondary | ICD-10-CM

## 2023-12-28 DIAGNOSIS — F17210 Nicotine dependence, cigarettes, uncomplicated: Secondary | ICD-10-CM | POA: Diagnosis not present

## 2023-12-28 DIAGNOSIS — L97519 Non-pressure chronic ulcer of other part of right foot with unspecified severity: Secondary | ICD-10-CM

## 2023-12-28 DIAGNOSIS — I70234 Atherosclerosis of native arteries of right leg with ulceration of heel and midfoot: Secondary | ICD-10-CM

## 2023-12-28 DIAGNOSIS — L97419 Non-pressure chronic ulcer of right heel and midfoot with unspecified severity: Secondary | ICD-10-CM | POA: Diagnosis not present

## 2023-12-28 DIAGNOSIS — Z89411 Acquired absence of right great toe: Secondary | ICD-10-CM | POA: Diagnosis not present

## 2023-12-28 DIAGNOSIS — E11621 Type 2 diabetes mellitus with foot ulcer: Secondary | ICD-10-CM | POA: Diagnosis not present

## 2023-12-28 DIAGNOSIS — Z89421 Acquired absence of other right toe(s): Secondary | ICD-10-CM | POA: Diagnosis not present

## 2023-12-28 LAB — GLUCOSE, CAPILLARY
Glucose-Capillary: 166 mg/dL — ABNORMAL HIGH (ref 70–99)
Glucose-Capillary: 113 mg/dL — ABNORMAL HIGH (ref 70–99)
Glucose-Capillary: 182 mg/dL — ABNORMAL HIGH (ref 70–99)
Glucose-Capillary: 138 mg/dL — ABNORMAL HIGH (ref 70–99)

## 2023-12-28 MED ORDER — IBUPROFEN 200 MG PO TABS
400.0000 mg | ORAL_TABLET | Freq: Once | ORAL | Status: AC | PRN
  Administered 2023-12-28: 400 mg via ORAL
  Filled 2023-12-28: qty 2

## 2023-12-28 MED ORDER — INFLUENZA VAC SPLIT HIGH-DOSE 0.5 ML IM SUSY
0.5000 mL | PREFILLED_SYRINGE | INTRAMUSCULAR | Status: AC
  Administered 2023-12-29: 0.5 mL via INTRAMUSCULAR
  Filled 2023-12-28: qty 0.5

## 2023-12-28 MED ORDER — ACETAMINOPHEN 500 MG PO TABS: 1000.0000 mg | ORAL_TABLET | Freq: Three times a day (TID) | ORAL | Status: DC | PRN

## 2023-12-28 MED ADMIN — Gabapentin Cap 300 MG: 600 mg | ORAL | NDC 16714066202

## 2023-12-28 MED ADMIN — Aspirin Tab Delayed Release 81 MG: 81 mg | ORAL | NDC 69618006610

## 2023-12-28 NOTE — Plan of Care (Signed)

## 2023-12-28 NOTE — Progress Notes (Addendum)
 PROGRESS NOTE    Mitchell [name]  FMW:[id number] DOB: [birth year] DOA: 12/26/2023 PCP: Maree Isles, MD   Brief Narrative:   73 y.o. male with medical history significant for diabetes mellitus, right ray amputation of fourth patient presented to the ED with complaints of chronic ulcer to his right foot. Over the past week reports increasing redness and worsening pain to his foot extending up to his knees. Reports he was on antibiotics- clindamycin  per med list, recently for the ulcer to his foot, but it did not improve. Right foot x-ray-soft tissue swelling of the stump without wound or ulceration distally, small erosions at cuboid and medial/middle cuneiform osteomyelitis not excluded. Blood cultures obtained. IV vancomycin , ceftriaxone  2 g and metronidazole  started. MRI right foot shows findings suspicious for early osteomyelitis at the distal cuboid with adjacent plantar ulceration with subcutaneous gas. Vascular Surgeon Dr Serene consulted Transferred to [facility] from [facility] on 12/12. On IV antibiotics. 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   -Received IV vancomycin , ceftriaxone  and metronidazole  in ED. - MRI right foot shows findings suspicious for early osteomyelitis at the distal cuboid with adjacent plantar ulceration with subcutaneous gas. -I have ordered IV Vanc and Cefepime  for osteomyelitis - F/u Blood cultures-NGTD - Discussed with vascular surgeon, Dr Serene. Transferred to [facility] from [facility] [place] on 12/12. May need Right BKA. -F/u RLE vascular doppler with ABIs  Status post right TMA  done in October 2022 by Dr. Serene   Diabetes mellitus type II-blood sugars 48 > 54> 135.  He is on Tresiba 96 units daily - HgbA1c - SSI- S - Hold Ozempic, Jardiance ,  - Hold off on long-acting insulin  initiation   Ongoing tobacco abuse-smokes 1 pack of cigarettes daily. - Nicotine  patch -Counseled extensively regarding tobacco cessation  Class II obesity, POA: As evidenced by BMI of 35.76.  He is likely a candidate for antiobesity medication based on presence of diabetes as well as diabetic complications.  Disposition: Lives at home by himself and is independent in activities of daily life.    DVT prophylaxis: Lovenox      Code Status: Full Code Family Communication:  None at the bedside Status is: Inpatient Remains inpatient appropriate because: Foot osteomyelitis    Subjective:  No acute events overnight. He said that he was seen by the vascular surgeon this morning. Denies any foot pain.  Examination:  General exam: Appears calm and comfortable  Respiratory system: Clear to auscultation. Respiratory effort normal. Cardiovascular system: S1 & S2 heard, RRR. No JVD, murmurs, rubs, gallops or clicks. No pedal edema. Gastrointestinal system: Abdomen is nondistended, soft and nontender. No organomegaly or masses felt. Normal bowel sounds heard. Central nervous system: Alert and oriented. No focal neurological deficits. Extremities/Skin: s/p Rt TMA,erythema of the remaining foot, ulceration over lateral aspect of the ankle, redness extends into the proximal leg, healed ulceration over plantar aspect Psychiatry: Judgement and insight appear normal. Mood & affect appropriate.       Diet Orders (From admission, onward)     Start     Ordered   12/26/23 1708  Diet heart healthy/carb modified Fluid consistency:  Thin  Diet effective now       Question:  Fluid consistency:  Answer:  Thin   12/26/23 1707            Objective: Vitals:   12/27/23 1837 12/28/23 0000  12/28/23 0302 12/28/23 0729  BP: (!) 140/52 (!) 152/66 (!) 145/58 (!) 136/51  Pulse: 73 74 72 70  Resp: (!) 21 18 18 18   Temp: 98.7 F (37.1 C) 98.5 F (36.9 C) 99 F (37.2 C) 98.3 F (36.8 C)  TempSrc: Oral Oral Oral Oral  SpO2: 97% 95% 93% 93%  Weight:      Height:        Intake/Output Summary (Last 24 hours) at 12/28/2023 1027 Last data filed at 12/28/2023 0701 Gross per 24 hour  Intake 120 ml  Output 900 ml  Net -780 ml   Filed Weights   12/26/23 1518  Weight: 117.9 kg    Scheduled Meds:  aspirin  EC  81 mg Oral Daily   enoxaparin  (LOVENOX ) injection  60 mg Subcutaneous QHS   gabapentin   600 mg Oral TID   insulin  aspart  0-5 Units Subcutaneous QHS   insulin  aspart  0-9 Units Subcutaneous TID WC   rosuvastatin   5 mg Oral Daily   Continuous Infusions:  ceFEPime  (MAXIPIME ) IV 2 g (12/28/23 0428)   vancomycin  1,000 mg (12/28/23 0023)    Nutritional status     Body mass index is 35.76 kg/m.  Data Reviewed:   CBC: Recent Labs  Lab 12/26/23 1550 12/27/23 0445  WBC 12.9* 10.9*  NEUTROABS 10.1*  --   HGB 13.9 13.0  HCT 45.5 41.6  MCV 90.3 89.5  PLT 305 275   Basic Metabolic Panel: Recent Labs  Lab 12/26/23 1550 12/27/23 0445  NA 142 140  K 4.5 4.2  CL 102 103  CO2 27 30  GLUCOSE 48* 63*  BUN 13 12  CREATININE 0.94 0.88  CALCIUM  9.1 8.5*   GFR: Estimated Creatinine Clearance: 98.4 mL/min (by C-G formula based on SCr of 0.88 mg/dL). Liver Function Tests: Recent Labs  Lab 12/26/23 1550  AST 21  ALT 16  ALKPHOS 109  BILITOT 0.4  PROT 7.8  ALBUMIN 3.8   No results for input(s): LIPASE, AMYLASE in the last 168 hours. No results for input(s): AMMONIA in the last 168 hours. Coagulation Profile: No results for input(s): INR, PROTIME in the last 168 hours. Cardiac Enzymes: No results for input(s): CKTOTAL, CKMB, CKMBINDEX, TROPONINI in the last 168 hours. BNP (last 3 results) No results for input(s): PROBNP in the last  8760 hours. HbA1C: Recent Labs    12/26/23 1550  HGBA1C 5.4   CBG: Recent Labs  Lab 12/27/23 0725 12/27/23 1129 12/27/23 1635 12/27/23 1900 12/28/23 0845  GLUCAP 75 113* 130* 184* 166*   Lipid Profile: No results for input(s): CHOL, HDL, LDLCALC, TRIG, CHOLHDL, LDLDIRECT in the last 72 hours. Thyroid  Function Tests: No results for input(s): TSH, T4TOTAL, FREET4, T3FREE, THYROIDAB in the last 72 hours. Anemia Panel: No results for input(s): VITAMINB12, FOLATE, FERRITIN, TIBC, IRON, RETICCTPCT in the last 72 hours. Sepsis Labs: Recent Labs  Lab 12/26/23 1550  LATICACIDVEN 1.6    Recent Results (from the past 240 hours)  Culture, blood (routine x 2)     Status: None (Preliminary result)   Collection Time: 12/26/23  3:50 PM   Specimen: BLOOD  Result Value Ref Range Status   Specimen Description BLOOD RIGHT ANTECUBITAL  Final   Special Requests  Final    BOTTLES DRAWN AEROBIC AND ANAEROBIC Blood Culture adequate volume   Culture   Final    NO GROWTH 2 DAYS Performed at [facility], [address] [place], [place] [address]    Report Status PENDING  Incomplete  Culture, blood (routine x 2)     Status: None (Preliminary result)   Collection Time: 12/26/23  3:54 PM   Specimen: BLOOD  Result Value Ref Range Status   Specimen Description BLOOD BLOOD RIGHT FOREARM  Final   Special Requests   Final    BOTTLES DRAWN AEROBIC AND ANAEROBIC Blood Culture adequate volume   Culture   Final    NO GROWTH 2 DAYS Performed at [facility], [address], [place], [place] [address]    Report Status PENDING  Incomplete         Radiology Studies: MR FOOT RIGHT WO CONTRAST Result Date: 12/27/2023 EXAM: MRI of the right Hindfoot without contrast. 12/27/2023 08:23:30 AM TECHNIQUE: Multiplanar multisequence MRI of the right hindfoot was performed without the administration of intravenous contrast. COMPARISON: Radiographs 12/26/2023 and MRI  10/26/2020. CLINICAL HISTORY: Possible osteomyelitis to right foot. Chronic diabetic foot ulcer with foul-smelling drainage. FINDINGS: LISFRANC JOINT: Amputation at the Lisfranc joint level. Mild irregularities of the distal articular surfaces of the cuneiforms and cuboid with low level edema along the irregular distal articular margins which is nonspecific but concerning for potential early osteomyelitis particularly in the cuboid given the adjacent plantar ulceration below the distal cuboid with tiny loculations of gas density in the underlying subcutaneous tissues for example on images 4 through 12 of series 7. Small adjacent likely degenerative subcortical cyst formation or erosions on the distal calcaneal head and adjacent proximal cuboid on image 11 series 8. This is more likely to be degenerative or due to mild erosive arthropathy. SOFT TISSUES: Circumferential subcutaneous edema in the ankle tracking into the foot. Adjacent plantar ulceration below the distal cuboid with tiny loculations of gas density in the underlying subcutaneous tissues for example on images 4 through 12 of series 7. TENDONS: Distal discontinuity as expected in the setting of amputation. IMPRESSION: 1. Findings suspicious for early osteomyelitis at the distal cuboid with adjacent plantar ulceration with subcutaneous gas. 2. Prior amputation at the Lisfranc joint level. 3. Likely degenerative subcortical cystic change or mild erosive arthropathy at the distal calcaneal head and proximal cuboid. 4. Circumferential subcutaneous edema of the ankle extending into the foot. Cellulitis not excluded. Electronically signed by: Ryan Salvage MD 12/27/2023 08:39 AM EST RP Workstation: [id number]   DG Tibia/Fibula Right Result Date: 12/26/2023 CLINICAL DATA:  Cellulitis edema wound EXAM: RIGHT TIBIA AND FIBULA - 2 VIEW COMPARISON:  None Available. FINDINGS: Generalized soft tissue edema without radiopaque foreign body or emphysema. No  definitive fracture or malalignment. No periostitis or osseous destructive change. IMPRESSION: Generalized soft tissue edema. No acute osseous abnormality. Electronically Signed   By: Luke Bun M.D.   On: 12/26/2023 16:07   DG Foot Complete Right Result Date: 12/26/2023 CLINICAL DATA:  Wound plantar surface of right foot cellulitis EXAM: RIGHT FOOT COMPLETE - 3+ VIEW COMPARISON:  10/25/2020 FINDINGS: Status post amputation of the right foot at the level of the TMT joints, small base of fifth metatarsal remnant noted. Soft tissue swelling of the stump with wound or ulceration. No soft tissue gas. Age indeterminate possible small erosions at the cuboid and 1/2 cuneiform. IMPRESSION: Status post amputation of the right foot at the level of the TMT joints. Soft tissue  swelling of the stump with wound or ulceration distally and along the plantar aspect. Age indeterminate possible small erosions at the cuboid and medial/middle cuneiform, osteomyelitis not excluded. This may be correlated with MRI if desired Electronically Signed   By: Luke Bun M.D.   On: 12/26/2023 16:07           LOS: 2 days   Time spent= 35 mins    Deliliah Room, MD [facility]  If 7PM-7AM, please contact night-coverage  12/28/2023, 10:27 AM

## 2023-12-28 NOTE — Consult Note (Signed)
 [facility] [place]  Patient name: Mitchell [name] MRN: [id number] DOB: April 04, 1950 Sex: male   REQUESTING PROVIDER:   Hospital service   REASON FOR CONSULT:    Right foot ulcer  HISTORY OF PRESENT ILLNESS:   Mitchell [name] is a 73 y.o. 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  This was for ulceration.  He had previously undergone angiography which did not show any correctable lesions.  He presented to the emergency department on 12/26/2023 with a chronic ulcer to the right foot which had been treated on an outpatient basis with antibiotics and wound care without significant improvement.  He was started on IV antibiotics and admitted to the hospital.  He did have redness and swelling of the right foot as well as a foul-smelling odor   The patient is a diabetic. He takes Neurontin  for peripheral neuropathy. He is a current smoker. He is on a statin for hypercholesterolemia   PAST MEDICAL HISTORY    Past Medical History:  Diagnosis Date   Arthritis    Asthma    Cellulitis of right lower extremity 05/09/2014   Diabetes mellitus without complication (HCC)    Diabetic foot ulcer (HCC) 07/07/2014   Status post bedside debridement of multiple right plantar ulcers by podiatrist, Dr. Macie.   Hypercholesterolemia    IDA (iron deficiency anemia)    Maggot infestation    right foot ulcer   Obesity 05/09/2014   Pneumonia    Tobacco abuse 07/07/2014     FAMILY HISTORY   History reviewed. No pertinent family history.  SOCIAL HISTORY:   Social History   Socioeconomic History   Marital status: Divorced    Spouse name: Not on file   Number of children: Not on file   Years of education: Not on file   Highest education level: Not on file  Occupational History   Not on file  Tobacco Use   Smoking status: Every Day    Current packs/day: 0.75    Types: Cigarettes   Smokeless tobacco: Never  Vaping  Use   Vaping status: Never Used  Substance and Sexual Activity   Alcohol  use: Yes    Alcohol /week: 0.0 standard drinks of alcohol     Comment: occ-twice a week beer approx 2   Drug use: No   Sexual activity: Not on file  Other Topics Concern   Not on file  Social History Narrative   Not on file   Social Drivers of Health   Tobacco Use: High Risk (12/26/2023)   Patient History    Smoking Tobacco Use: Every Day    Smokeless Tobacco Use: Never    Passive Exposure: Not on file  Financial Resource Strain: Not on file  Food Insecurity: No Food Insecurity (12/26/2023)   Epic    Worried About Programme Researcher, Broadcasting/film/video in the Last Year: Never true    Ran Out of Food in the Last Year: Never true  Transportation Needs: No Transportation Needs (12/26/2023)   Epic    Lack of Transportation (Medical): No    Lack of Transportation (Non-Medical): No  Physical Activity: Not on file  Stress: Not on file  Social Connections: Moderately Isolated (12/26/2023)   Social Connection and Isolation Panel    Frequency of Communication with Friends and Family: More than three times a week    Frequency of Social Gatherings with Friends and Family: More than three times a week    Attends Religious Services: More than 4  times per year    Active Member of Clubs or Organizations: No    Attends Banker Meetings: Never    Marital Status: Divorced  Catering Manager Violence: Not At Risk (12/26/2023)   Epic    Fear of Current or Ex-Partner: No    Emotionally Abused: No    Physically Abused: No    Sexually Abused: No  Depression (PHQ2-9): Not on file  Alcohol  Screen: Not on file  Housing: Low Risk (12/26/2023)   Epic    Unable to Pay for Housing in the Last Year: No    Number of Times Moved in the Last Year: 0    Homeless in the Last Year: No  Utilities: Not At Risk (12/26/2023)   Epic    Threatened with loss of utilities: No  Health Literacy: Not on file    ALLERGIES:     Allergies[1]  CURRENT MEDICATIONS:    Current Facility-Administered Medications  Medication Dose Route Frequency Provider Last Rate Last Admin   acetaminophen  (TYLENOL ) tablet 650 mg  650 mg Oral Q6H PRN Emokpae, Ejiroghene E, MD   650 mg at 12/27/23 1739   Or   acetaminophen  (TYLENOL ) suppository 650 mg  650 mg Rectal Q6H PRN Emokpae, Ejiroghene E, MD       aspirin  EC tablet 81 mg  81 mg Oral Daily Emokpae, Ejiroghene E, MD   81 mg at 12/28/23 0843   ceFEPIme  (MAXIPIME ) 2 g in sodium chloride  0.9 % 100 mL IVPB  2 g Intravenous Q8H Rashid, Farhan, MD 200 mL/hr at 12/28/23 0428 2 g at 12/28/23 0428   enoxaparin  (LOVENOX ) injection 60 mg  60 mg Subcutaneous QHS Emokpae, Ejiroghene E, MD   60 mg at 12/27/23 2211   gabapentin  (NEURONTIN ) capsule 600 mg  600 mg Oral TID Emokpae, Ejiroghene E, MD   600 mg at 12/28/23 0843   insulin  aspart (novoLOG ) injection 0-5 Units  0-5 Units Subcutaneous QHS Emokpae, Ejiroghene E, MD       insulin  aspart (novoLOG ) injection 0-9 Units  0-9 Units Subcutaneous TID WC Emokpae, Ejiroghene E, MD   2 Units at 12/28/23 0857   ondansetron  (ZOFRAN ) tablet 4 mg  4 mg Oral Q6H PRN Emokpae, Ejiroghene E, MD       Or   ondansetron  (ZOFRAN ) injection 4 mg  4 mg Intravenous Q6H PRN Emokpae, Ejiroghene E, MD       polyethylene glycol (MIRALAX  / GLYCOLAX ) packet 17 g  17 g Oral Daily PRN Emokpae, Ejiroghene E, MD       rosuvastatin  (CRESTOR ) tablet 5 mg  5 mg Oral Daily Emokpae, Ejiroghene E, MD   5 mg at 12/28/23 9156   vancomycin  (VANCOCIN ) IVPB 1000 mg/200 mL premix  1,000 mg Intravenous Q12H Rashid, Farhan, MD 200 mL/hr at 12/28/23 0023 1,000 mg at 12/28/23 0023    REVIEW OF SYSTEMS:   [X]  denotes positive finding, [ ]  denotes negative finding Cardiac  Comments:  Chest pain or chest pressure:    Shortness of breath upon exertion:    Short of breath when lying flat:    Irregular heart rhythm:        Vascular    Pain in calf, thigh, or hip brought on by  ambulation:    Pain in feet at night that wakes you up from your sleep:     Blood clot in your veins:    Leg swelling:         Pulmonary    Oxygen at  home:    Productive cough:     Wheezing:         Neurologic    Sudden weakness in arms or legs:     Sudden numbness in arms or legs:     Sudden onset of difficulty speaking or slurred speech:    Temporary loss of vision in one eye:     Problems with dizziness:         Gastrointestinal    Blood in stool:      Vomited blood:         Genitourinary    Burning when urinating:     Blood in urine:        Psychiatric    Major depression:         Hematologic    Bleeding problems:    Problems with blood clotting too easily:        Skin    Rashes or ulcers:        Constitutional    Fever or chills:     PHYSICAL EXAM:   Vitals:   12/27/23 1837 12/28/23 0000 12/28/23 0302 12/28/23 0729  BP: (!) 140/52 (!) 152/66 (!) 145/58 (!) 136/51  Pulse: 73 74 72 70  Resp: (!) 21 18 18 18   Temp: 98.7 F (37.1 C) 98.5 F (36.9 C) 99 F (37.2 C) 98.3 F (36.8 C)  TempSrc: Oral Oral Oral Oral  SpO2: 97% 95% 93% 93%  Weight:      Height:        GENERAL: The patient is a well-nourished male, in no acute distress. The vital signs are documented above. CARDIAC: There is a regular rate and rhythm.  VASCULAR: Unable to palpate pedal pulses.  Erythema foot with ulcers PULMONARY: Nonlabored respirations ABDOMEN: Soft and non-tender  MUSCULOSKELETAL: There are no major deformities or cyanosis. NEUROLOGIC: No focal weakness or paresthesias are detected. SKIN: See photo below PSYCHIATRIC: The patient has a normal affect.     STUDIES:   I have reviewed the following images from his MRI: 1. Findings suspicious for early osteomyelitis at the distal cuboid with adjacent plantar ulceration with subcutaneous gas. 2. Prior amputation at the Lisfranc joint level. 3. Likely degenerative subcortical cystic change or mild erosive arthropathy  at the distal calcaneal head and proximal cuboid. 4. Circumferential subcutaneous edema of the ankle extending into the foot. Cellulitis not excluded.  ASSESSMENT and PLAN   Right foot ulcer: Patient has cellulitis and presumed osteomyelitis of his foot.  This is a limb threatening situation.  The patient wants to do everything to avoid amputation.  I will start by getting a vascular duplex of the right leg with ABIs to evaluate his blood flow.  If this comes back abnormal we will consider angiography.  However, even with optimize vascular perfusion, he remains at very high risk for below-knee amputation.  I have also asked Dr. Harden for his input.   Malvina New, IV, MD, FACS [facility] [phone] Pager [phone]     [1]  Allergies Allergen Reactions   Penicillins Itching, Dermatitis and Rash    Immediate rash, facial/tongue/throat swelling, SOB or lightheadedness with hypotension  Has tolerate multiple cephalosporins in the past

## 2023-12-28 NOTE — Progress Notes (Signed)
 Patient dressing has been changed the last two days. Foot and leg are improving- less red, still purulent smell, but it is less than yesterday on 12/27/23 when admitted to floor at 650 PM. Patient stated he has some faint pain , L foot will hurt more often than R. R leg /wound on foot less red, less inflamed, less drainage. Appearing to be trying to heal at this time. Wound has been washed with sterile water and Vashe, xeroform placed and abd pad, wrapped in Kerlix. Patient tolerated dressing well. A wound care consult was placed yesterday.

## 2023-12-28 NOTE — Plan of Care (Signed)
  Problem: Education: Goal: Ability to describe self-care measures that may prevent or decrease complications (Diabetes Survival Skills Education) will improve Outcome: Progressing   Problem: Education: Goal: Individualized Educational Video(s) Outcome: Progressing   Problem: Coping: Goal: Ability to adjust to condition or change in health will improve Outcome: Progressing

## 2023-12-29 ENCOUNTER — Inpatient Hospital Stay (HOSPITAL_COMMUNITY)

## 2023-12-29 DIAGNOSIS — I70234 Atherosclerosis of native arteries of right leg with ulceration of heel and midfoot: Secondary | ICD-10-CM | POA: Diagnosis not present

## 2023-12-29 DIAGNOSIS — L97519 Non-pressure chronic ulcer of other part of right foot with unspecified severity: Secondary | ICD-10-CM | POA: Diagnosis not present

## 2023-12-29 LAB — GLUCOSE, CAPILLARY
Glucose-Capillary: 152 mg/dL — ABNORMAL HIGH (ref 70–99)
Glucose-Capillary: 120 mg/dL — ABNORMAL HIGH (ref 70–99)
Glucose-Capillary: 135 mg/dL — ABNORMAL HIGH (ref 70–99)

## 2023-12-29 LAB — VAS US ABI WITH/WO TBI
Left ABI: 1.03
Right ABI: 0.77

## 2023-12-29 MED ORDER — NICOTINE 21 MG/24HR TD PT24
21.0000 mg | MEDICATED_PATCH | Freq: Every day | TRANSDERMAL | Status: DC
  Administered 2023-12-29 – 2024-01-02 (×5): 21 mg via TRANSDERMAL
  Filled 2023-12-29 (×5): qty 1

## 2023-12-29 MED ORDER — NICOTINE POLACRILEX 2 MG MT GUM: 2.0000 mg | CHEWING_GUM | OROMUCOSAL | Status: DC | PRN

## 2023-12-29 MED ADMIN — Aspirin Tab Delayed Release 81 MG: 81 mg | ORAL | NDC 69618006610

## 2023-12-29 MED ADMIN — Gabapentin Cap 300 MG: 600 mg | ORAL | NDC 16714066202

## 2023-12-29 NOTE — Progress Notes (Signed)
 Progress Note    Mitchell [name]  FMW:[id number] DOB: 02-08-1950  DOA: 12/26/2023 PCP: Maree Isles, MD      Brief Narrative:    Medical records reviewed and are as summarized below:  Mitchell [name] is a 73 y.o. male medical history significant for diabetes mellitus, right ray amputation of fourth patient presented to the ED with complaints of chronic ulcer to his right foot.  He noticed increasing redness and pain of the right foot extending up to the knees.  This has been going on for about a week. He said he had been on a clindamycin  recently for right foot ulcer but there was no improvement in symptoms.   He was initially admitted to [facility] from right foot cellulitis and osteomyelitis.  He was subsequently transferred to [facility] for further management.     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  (Class II obesity)   Right diabetic foot ulcer with cellulitis and osteomyelitis, continue empiric IV antibiotics.  Analgesics as needed for pain. Arterial duplex with ABI pending.  Plan for lower extremity angiography if ABI is normal.  Follow-up with vascular surgeon. 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 Serene   Type II DM: Continue NovoLog  as needed for hyperglycemia.   Tobacco use disorder: Counseled to quit.   Diet Order             Diet heart healthy/carb modified Fluid consistency: Thin  Diet effective now                                  Consultants: Vascular surgeon  Procedures: None    Medications:    aspirin  EC  81 mg Oral Daily   enoxaparin  (LOVENOX ) injection  60 mg Subcutaneous QHS   gabapentin   600 mg Oral TID   insulin  aspart  0-5 Units Subcutaneous QHS   insulin  aspart  0-9 Units Subcutaneous TID WC   rosuvastatin    5 mg Oral Daily   Continuous Infusions:  ceFEPime  (MAXIPIME ) IV 2 g (12/29/23 1341)   vancomycin  1,000 mg (12/29/23 1418)     Anti-infectives (From admission, onward)    Start     Dose/Rate Route Frequency Ordered Stop   12/28/23 0000  vancomycin  (VANCOREADY) IVPB 1250 mg/250 mL  Status:  Discontinued       Placed in Followed by Linked Group   1,250 mg 166.7 mL/hr over 90 Minutes Intravenous Every 12 hours 12/27/23 1102 12/27/23 1110   12/28/23 0000  vancomycin  (VANCOCIN ) IVPB 1000 mg/200 mL premix       Placed in Followed by Linked Group   1,000 mg 200 mL/hr over 60 Minutes Intravenous Every 12 hours 12/27/23 1110     12/27/23 1200  vancomycin  (VANCOREADY) IVPB 2000 mg/400 mL  Status:  Discontinued       Placed in Followed by Linked Group   2,000 mg 200 mL/hr over 120 Minutes Intravenous  Once 12/27/23 1102 12/27/23 1110   12/27/23 1200  vancomycin  (VANCOREADY) IVPB 2000 mg/400 mL       Placed in Followed by Linked Group   2,000 mg 200 mL/hr over 120 Minutes Intravenous  Once 12/27/23 1110 12/27/23 1415   12/27/23  1130  ceFEPIme  (MAXIPIME ) 2 g in sodium chloride  0.9 % 100 mL IVPB        2 g 200 mL/hr over 30 Minutes Intravenous Every 8 hours 12/27/23 1049     12/26/23 1545  vancomycin  (VANCOCIN ) IVPB 1000 mg/200 mL premix        1,000 mg 200 mL/hr over 60 Minutes Intravenous  Once 12/26/23 1536 12/26/23 1709   12/26/23 1545  cefTRIAXone  (ROCEPHIN ) 2 g in sodium chloride  0.9 % 100 mL IVPB        2 g 200 mL/hr over 30 Minutes Intravenous  Once 12/26/23 1536 12/26/23 1637   12/26/23 1545  metroNIDAZOLE  (FLAGYL ) IVPB 500 mg        500 mg 100 mL/hr over 60 Minutes Intravenous  Once 12/26/23 1536 12/26/23 1706              Family Communication/Anticipated D/C date and plan/Code Status   DVT prophylaxis:      Code Status: Full Code  Family Communication: None Disposition Plan: Plan to discharge home   Status is: Inpatient Remains inpatient appropriate  because: Right foot infection       Subjective:   Interval events noted.  No complaints.  Objective:    Vitals:   12/28/23 1616 12/28/23 2059 12/29/23 0412 12/29/23 0715  BP: (!) 126/51 129/64 (!) 133/59 133/61  Pulse: 72 78 71 (!) 57  Resp: 16 18 16    Temp: 97.7 F (36.5 C) 98 F (36.7 C) 98.1 F (36.7 C) 97.9 F (36.6 C)  TempSrc: Oral Oral Oral   SpO2: 96% 96% 97% 96%  Weight:      Height:       No data found.   Intake/Output Summary (Last 24 hours) at 12/29/2023 1503 Last data filed at 12/28/2023 2330 Gross per 24 hour  Intake 177 ml  Output 1000 ml  Net -823 ml   Filed Weights   12/26/23 1518  Weight: 117.9 kg    Exam:  GEN: NAD SKIN: Warm and dry EYES: No pallor or icterus ENT: MMM CV: RRR PULM: CTA B ABD: soft, ND, NT, +BS CNS: AAO x 3, non focal EXT: Dressing on right foot wound stained with some drainage.        Data Reviewed:   I have personally reviewed following labs and imaging studies:  Labs: Labs show the following:   Basic Metabolic Panel: Recent Labs  Lab 12/26/23 1550 12/27/23 0445  NA 142 140  K 4.5 4.2  CL 102 103  CO2 27 30  GLUCOSE 48* 63*  BUN 13 12  CREATININE 0.94 0.88  CALCIUM  9.1 8.5*   GFR Estimated Creatinine Clearance: 98.4 mL/min (by C-G formula based on SCr of 0.88 mg/dL). Liver Function Tests: Recent Labs  Lab 12/26/23 1550  AST 21  ALT 16  ALKPHOS 109  BILITOT 0.4  PROT 7.8  ALBUMIN 3.8   No results for input(s): LIPASE, AMYLASE in the last 168 hours. No results for input(s): AMMONIA in the last 168 hours. Coagulation profile No results for input(s): INR, PROTIME in the last 168 hours.  CBC: Recent Labs  Lab 12/26/23 1550 12/27/23 0445  WBC 12.9* 10.9*  NEUTROABS 10.1*  --   HGB 13.9 13.0  HCT 45.5 41.6  MCV 90.3 89.5  PLT 305 275   Cardiac Enzymes: No results for input(s): CKTOTAL, CKMB, CKMBINDEX, TROPONINI in the last 168 hours. BNP (last 3  results) No results for input(s): PROBNP in the last 8760 hours.  CBG: Recent Labs  Lab 12/28/23 0845 12/28/23 1154 12/28/23 1733 12/28/23 2146 12/29/23 1138  GLUCAP 166* 113* 182* 138* 152*   D-Dimer: No results for input(s): DDIMER in the last 72 hours. Hgb A1c: Recent Labs    12/26/23 1550  HGBA1C 5.4   Lipid Profile: No results for input(s): CHOL, HDL, LDLCALC, TRIG, CHOLHDL, LDLDIRECT in the last 72 hours. Thyroid  function studies: No results for input(s): TSH, T4TOTAL, T3FREE, THYROIDAB in the last 72 hours.  Invalid input(s): FREET3 Anemia work up: No results for input(s): VITAMINB12, FOLATE, FERRITIN, TIBC, IRON, RETICCTPCT in the last 72 hours. Sepsis Labs: Recent Labs  Lab 12/26/23 1550 12/27/23 0445  WBC 12.9* 10.9*  LATICACIDVEN 1.6  --     Microbiology Recent Results (from the past 240 hours)  Culture, blood (routine x 2)     Status: None (Preliminary result)   Collection Time: 12/26/23  3:50 PM   Specimen: BLOOD  Result Value Ref Range Status   Specimen Description BLOOD RIGHT ANTECUBITAL  Final   Special Requests   Final    BOTTLES DRAWN AEROBIC AND ANAEROBIC Blood Culture adequate volume   Culture   Final    NO GROWTH 3 DAYS Performed at [facility], [address], [place], [place] [address]    Report Status PENDING  Incomplete  Culture, blood (routine x 2)     Status: None (Preliminary result)   Collection Time: 12/26/23  3:54 PM   Specimen: BLOOD  Result Value Ref Range Status   Specimen Description BLOOD BLOOD RIGHT FOREARM  Final   Special Requests   Final    BOTTLES DRAWN AEROBIC AND ANAEROBIC Blood Culture adequate volume   Culture   Final    NO GROWTH 3 DAYS Performed at [facility], [address], [place], [place] [address]    Report Status PENDING  Incomplete    Procedures and diagnostic studies:  VAS US  LOWER EXTREMITY ARTERIAL DUPLEX Result Date: 12/29/2023 LOWER EXTREMITY ARTERIAL  DUPLEX STUDY Patient Name:  Mitchell [name]  Date of Exam:   12/29/2023 Medical Rec #: [id number]   Accession #:    [id number] Date of Birth: 19-Apr-1950   Patient Gender: M Patient Age:   84 years Exam Location:  [facility] Procedure:      VAS US  LOWER EXTREMITY ARTERIAL DUPLEX Referring Phys: GAILE NEW --------------------------------------------------------------------------------  Indications: Right transmetatarsal amputation, 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 High Risk Factors: Hypertension, Diabetes, current smoker.  Chronic ulceration with new cellulitis and probable  osteomyelitis.  Vascular Interventions: Aortogram 07/22/15 indicating widely patent aortoiliac,                         SFA, pop, and tibial arteries bilaterally. Current ABI:            R: 0.77 L:1.03/0.46 (TBI) Comparison Study: No prior study on file Performing Technologist: Alberta Lis RVS  Examination Guidelines: A complete evaluation includes B-mode imaging, spectral Doppler, color Doppler, and power Doppler as needed of all accessible portions of each vessel. Bilateral testing is considered an integral part of a complete examination. Limited examinations for reoccurring indications may be performed as noted.  +----------+--------+-----+---------------+----------+-------------------------+ RIGHT     PSV cm/sRatioStenosis       Waveform  Comments                  +----------+--------+-----+---------------+----------+-------------------------+ CFA Prox  181  biphasic                            +----------+--------+-----+---------------+----------+-------------------------+ CFA Distal111                         monophasic                          +----------+--------+-----+---------------+----------+-------------------------+ DFA       109                         monophasic                          +----------+--------+-----+---------------+----------+-------------------------+ SFA Prox  123                          monophasic                          +----------+--------+-----+---------------+----------+-------------------------+ SFA Mid   451          75-99% stenosismonophasic                          +----------+--------+-----+---------------+----------+-------------------------+ SFA Distal204          50-74% stenosismonophasic                          +----------+--------+-----+---------------+----------+-------------------------+ POP Prox  192          30-49% stenosismonophasic                          +----------+--------+-----+---------------+----------+-------------------------+ POP Mid   188          30-49% stenosismonophasic                          +----------+--------+-----+---------------+----------+-------------------------+ POP Distal174          30-49% stenosismonophasic                          +----------+--------+-----+---------------+----------+-------------------------+ TP Trunk  124                         monophasic                          +----------+--------+-----+---------------+----------+-------------------------+ ATA Prox  55                          monophasic                          +----------+--------+-----+---------------+----------+-------------------------+ ATA Mid   103                         monophasiccollaterals               +----------+--------+-----+---------------+----------+-------------------------+ ATA Distal37                          monophasic                          +----------+--------+-----+---------------+----------+-------------------------+  PTA Prox  127                         monophasic                          +----------+--------+-----+---------------+----------+-------------------------+ PTA Mid   59                          monophasiccollaterals               +----------+--------+-----+---------------+----------+-------------------------+ PTA Distal77                           monophasic                          +----------+--------+-----+---------------+----------+-------------------------+ PERO Prox                                       not visualized secondary                                                  to edema                  +----------+--------+-----+---------------+----------+-------------------------+  +-----------+--------+-----+---------------+----------+-----------+ LEFT       PSV cm/sRatioStenosis       Waveform  Comments    +-----------+--------+-----+---------------+----------+-----------+ CFA Prox   123                         biphasic              +-----------+--------+-----+---------------+----------+-----------+ CFA Distal 144                         biphasic              +-----------+--------+-----+---------------+----------+-----------+ DFA        128                         biphasic              +-----------+--------+-----+---------------+----------+-----------+ SFA Prox   166          30-49% stenosisbiphasic              +-----------+--------+-----+---------------+----------+-----------+ SFA Mid    153          30-49% stenosisbiphasic              +-----------+--------+-----+---------------+----------+-----------+ SFA Distal 164          30-49% stenosisbiphasic              +-----------+--------+-----+---------------+----------+-----------+ POP Prox   273          50-74% stenosisbiphasic              +-----------+--------+-----+---------------+----------+-----------+ POP Mid    211                         biphasic              +-----------+--------+-----+---------------+----------+-----------+ POP Distal 115  biphasic              +-----------+--------+-----+---------------+----------+-----------+ TP Trunk   87                          biphasic              +-----------+--------+-----+---------------+----------+-----------+ ATA Prox   62                           biphasic              +-----------+--------+-----+---------------+----------+-----------+ ATA Mid    22                          monophasic            +-----------+--------+-----+---------------+----------+-----------+ ATA Distal 20                          monophasiccollaterals +-----------+--------+-----+---------------+----------+-----------+ PTA Prox   77                          biphasic              +-----------+--------+-----+---------------+----------+-----------+ PTA Mid    93                          biphasic              +-----------+--------+-----+---------------+----------+-----------+ PTA Distal 91                          biphasic              +-----------+--------+-----+---------------+----------+-----------+ PERO Prox  57                          biphasic              +-----------+--------+-----+---------------+----------+-----------+ PERO Mid   63                          biphasic              +-----------+--------+-----+---------------+----------+-----------+ PERO Distal86                          biphasic              +-----------+--------+-----+---------------+----------+-----------+  Summary: Right: 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 50-74% stenosis noted in the distal superficial femoral artery/proximal popliteal artery. 30-49% stenosis noted in the popliteal artery. Diffuse calcific plaque noted throughout the right lower extremity Left: 30-49% stenosis noted in the superficial femoral artery. 50-74% stenosis noted in the popliteal artery. Diffuse calcific plaque noted throughout the left lower extremity.  See table(s) above for measurements and observations.    Preliminary    VAS US  ABI WITH/WO TBI Result Date: 12/29/2023  LOWER EXTREMITY DOPPLER STUDY Patient Name:  Mitchell [name]  Date of Exam:   12/29/2023 Medical Rec #: [id number]   Accession #:    [id number] Date of Birth: 07/15/1950    Patient Gender: M Patient Age:   37 years Exam Location:  [facility] Procedure:      VAS US  ABI WITH/WO TBI Referring Phys: GAILE NEW --------------------------------------------------------------------------------  Indications:  Chronic ulceration with new cellulitis and probable osteomyelitis High Risk Factors: Hypertension, Diabetes, current smoker. Other Factors: Medication noncompliance secondary to cost.  Vascular Interventions: Right transmetatarsal amputation 10/28/20. Aortogram                         07/22/15 indicating widely patent aortoiliac, SFA,                         popliteal, and tibial arteries bilaterally. Limitations: Today's exam was limited due to bandages, Edema, pain with cuff              pressure, right calf. Comparison Study: No prior ABI on file Performing Technologist: Rachel Pellet RVS  Examination Guidelines: A complete evaluation includes at minimum, Doppler waveform signals and systolic blood pressure reading at the level of bilateral brachial, anterior tibial, and posterior tibial arteries, when vessel segments are accessible. Bilateral testing is considered an integral part of a complete examination. Photoelectric Plethysmograph (PPG) waveforms and toe systolic pressure readings are included as required and additional duplex testing as needed. Limited examinations for reoccurring indications may be performed as noted.  ABI Findings: +---------+------------------+-----+---------+----------+ Right    Rt Pressure (mmHg)IndexWaveform Comment    +---------+------------------+-----+---------+----------+ Brachial 148                    triphasic           +---------+------------------+-----+---------+----------+ PTA      125               0.77 biphasic            +---------+------------------+-----+---------+----------+ DP       106               0.65 biphasic            +---------+------------------+-----+---------+----------+ Great Toe                                 amputation +---------+------------------+-----+---------+----------+ +---------+------------------+-----+---------+-------+ Left     Lt Pressure (mmHg)IndexWaveform Comment +---------+------------------+-----+---------+-------+ Brachial 162                    triphasic        +---------+------------------+-----+---------+-------+ PTA      160               0.99 biphasic         +---------+------------------+-----+---------+-------+ DP       167               1.03 biphasic         +---------+------------------+-----+---------+-------+ Great Toe75                0.46                  +---------+------------------+-----+---------+-------+ +-------+-----------+-----------+------------+------------+ ABI/TBIToday's ABIToday's TBIPrevious ABIPrevious TBI +-------+-----------+-----------+------------+------------+ Right  0.77       amputation                          +-------+-----------+-----------+------------+------------+ Left   1.03       0.46                                +-------+-----------+-----------+------------+------------+   Summary: Right: Resting right ankle-brachial index indicates moderate right lower extremity  arterial disease.  Left: Resting left ankle-brachial index is within normal range. Left toe pressure is >60 mmHg which suggests adequate perfusion for healing. *See table(s) above for measurements and observations.     Preliminary                LOS: 3 days   Mitchell [name]  [facility]   Pager on [email or web address]. If 7PM-7AM, please contact night-coverage at [email or web address]     12/29/2023, 3:03 PM

## 2023-12-29 NOTE — Progress Notes (Signed)
 VASCULAR LAB    ABI has been performed.  See CV proc for preliminary results.   Ellayna Hilligoss, RVT 12/29/2023, 1:30 PM

## 2023-12-29 NOTE — Progress Notes (Signed)
 Subjective  -   Says his foot is doing better   Physical Exam:  Right foot dressing clean dry and intact       Assessment/Plan:    Right foot ulcer: Vascular lab studies are pending.  If there is any abnormality, I would recommend angiography via left femoral approach and intervention of the right leg as indicated.  I discussed that he remains at high risk for more proximal amputation.  He understands this.  Will try to do everything we can to save his leg.  I have also asked Dr. Harden to evaluate his ulcers.  Will plan angiography tomorrow if indicated  [place] Mitchell [name] 12/29/2023 11:00 AM --  Vitals:   12/29/23 0412 12/29/23 0715  BP: (!) 133/59 133/61  Pulse: 71 (!) 57  Resp: 16   Temp: 98.1 F (36.7 C) 97.9 F (36.6 C)  SpO2: 97% 96%    Intake/Output Summary (Last 24 hours) at 12/29/2023 1100 Last data filed at 12/28/2023 2330 Gross per 24 hour  Intake 177 ml  Output 1400 ml  Net -1223 ml     Laboratory CBC    Component Value Date/Time   WBC 10.9 (H) 12/27/2023 0445   HGB 13.0 12/27/2023 0445   HCT 41.6 12/27/2023 0445   PLT 275 12/27/2023 0445    BMET    Component Value Date/Time   NA 140 12/27/2023 0445   K 4.2 12/27/2023 0445   CL 103 12/27/2023 0445   CO2 30 12/27/2023 0445   GLUCOSE 63 (L) 12/27/2023 0445   BUN 12 12/27/2023 0445   CREATININE 0.88 12/27/2023 0445   CALCIUM  8.5 (L) 12/27/2023 0445   GFRNONAA >60 12/27/2023 0445   GFRAA >60 08/29/2014 1627    COAG Lab Results  Component Value Date   INR 1.2 10/25/2020   No results found for: PTT  Antibiotics Anti-infectives (From admission, onward)    Start     Dose/Rate Route Frequency Ordered Stop   12/28/23 0000  vancomycin  (VANCOREADY) IVPB 1250 mg/250 mL  Status:  Discontinued       Placed in Followed by Linked Group   1,250 mg 166.7 mL/hr over 90 Minutes Intravenous Every 12 hours 12/27/23 1102 12/27/23 1110   12/28/23 0000  vancomycin  (VANCOCIN ) IVPB 1000 mg/200 mL  premix       Placed in Followed by Linked Group   1,000 mg 200 mL/hr over 60 Minutes Intravenous Every 12 hours 12/27/23 1110     12/27/23 1200  vancomycin  (VANCOREADY) IVPB 2000 mg/400 mL  Status:  Discontinued       Placed in Followed by Linked Group   2,000 mg 200 mL/hr over 120 Minutes Intravenous  Once 12/27/23 1102 12/27/23 1110   12/27/23 1200  vancomycin  (VANCOREADY) IVPB 2000 mg/400 mL       Placed in Followed by Linked Group   2,000 mg 200 mL/hr over 120 Minutes Intravenous  Once 12/27/23 1110 12/27/23 1415   12/27/23 1130  ceFEPIme  (MAXIPIME ) 2 g in sodium chloride  0.9 % 100 mL IVPB        2 g 200 mL/hr over 30 Minutes Intravenous Every 8 hours 12/27/23 1049     12/26/23 1545  vancomycin  (VANCOCIN ) IVPB 1000 mg/200 mL premix        1,000 mg 200 mL/hr over 60 Minutes Intravenous  Once 12/26/23 1536 12/26/23 1709   12/26/23 1545  cefTRIAXone  (ROCEPHIN ) 2 g in sodium chloride  0.9 % 100 mL IVPB  2 g 200 mL/hr over 30 Minutes Intravenous  Once 12/26/23 1536 12/26/23 1637   12/26/23 1545  metroNIDAZOLE  (FLAGYL ) IVPB 500 mg        500 mg 100 mL/hr over 60 Minutes Intravenous  Once 12/26/23 1536 12/26/23 1706        V. Malvina Serene CLORE, M.D., [facility] [place] Office: [phone] Pager:  [phone]

## 2023-12-29 NOTE — Progress Notes (Signed)
 VASCULAR LAB    Bilateral lower extremity arterial duplex has been performed.  See CV proc for preliminary results.   Karmine Kauer, RVT 12/29/2023, 1:30 PM

## 2023-12-29 NOTE — Progress Notes (Signed)
 [facility] night cross cover note:   Nicotine  patch as well as as needed Nicorette  gum added.    Eva Pore, DO Hospitalist

## 2023-12-30 ENCOUNTER — Ambulatory Visit (HOSPITAL_COMMUNITY)

## 2023-12-30 ENCOUNTER — Inpatient Hospital Stay (HOSPITAL_COMMUNITY): Admission: EM | Disposition: A | Payer: Self-pay | Source: Home / Self Care | Attending: Internal Medicine

## 2023-12-30 DIAGNOSIS — I70234 Atherosclerosis of native arteries of right leg with ulceration of heel and midfoot: Secondary | ICD-10-CM | POA: Diagnosis not present

## 2023-12-30 DIAGNOSIS — L97519 Non-pressure chronic ulcer of other part of right foot with unspecified severity: Secondary | ICD-10-CM | POA: Diagnosis not present

## 2023-12-30 DIAGNOSIS — I70291 Other atherosclerosis of native arteries of extremities, right leg: Secondary | ICD-10-CM | POA: Diagnosis not present

## 2023-12-30 DIAGNOSIS — I739 Peripheral vascular disease, unspecified: Secondary | ICD-10-CM

## 2023-12-30 DIAGNOSIS — Z95820 Peripheral vascular angioplasty status with implants and grafts: Secondary | ICD-10-CM | POA: Diagnosis not present

## 2023-12-30 DIAGNOSIS — Z89411 Acquired absence of right great toe: Secondary | ICD-10-CM | POA: Diagnosis not present

## 2023-12-30 DIAGNOSIS — M7989 Other specified soft tissue disorders: Secondary | ICD-10-CM | POA: Diagnosis not present

## 2023-12-30 DIAGNOSIS — Z89421 Acquired absence of other right toe(s): Secondary | ICD-10-CM | POA: Diagnosis not present

## 2023-12-30 HISTORY — PX: LOWER EXTREMITY INTERVENTION: CATH118252

## 2023-12-30 HISTORY — PX: LOWER EXTREMITY ANGIOGRAPHY: CATH118251

## 2023-12-30 LAB — GLUCOSE, CAPILLARY
Glucose-Capillary: 155 mg/dL — ABNORMAL HIGH (ref 70–99)
Glucose-Capillary: 136 mg/dL — ABNORMAL HIGH (ref 70–99)
Glucose-Capillary: 151 mg/dL — ABNORMAL HIGH (ref 70–99)
Glucose-Capillary: 93 mg/dL (ref 70–99)
Glucose-Capillary: 109 mg/dL — ABNORMAL HIGH (ref 70–99)
Glucose-Capillary: 103 mg/dL — ABNORMAL HIGH (ref 70–99)
Glucose-Capillary: 95 mg/dL (ref 70–99)
Glucose-Capillary: 114 mg/dL — ABNORMAL HIGH (ref 70–99)
Glucose-Capillary: 221 mg/dL — ABNORMAL HIGH (ref 70–99)

## 2023-12-30 LAB — BASIC METABOLIC PANEL WITH GFR
Anion gap: 7 (ref 5–15)
BUN: 18 mg/dL (ref 8–23)
CO2: 25 mmol/L (ref 22–32)
Calcium: 8 mg/dL — ABNORMAL LOW (ref 8.9–10.3)
Chloride: 104 mmol/L (ref 98–111)
Creatinine, Ser: 0.95 mg/dL (ref 0.61–1.24)
GFR, Estimated: >60 mL/min (ref >60)
Glucose, Bld: 124 mg/dL — ABNORMAL HIGH (ref 70–99)
Potassium: 4.1 mmol/L (ref 3.5–5.1)
Sodium: 136 mmol/L (ref 135–145)

## 2023-12-30 MED ORDER — SODIUM CHLORIDE 0.9% FLUSH: 3.0000 mL | INTRAVENOUS | Status: DC | PRN

## 2023-12-30 MED ORDER — CLOPIDOGREL BISULFATE 75 MG PO TABS
75.0000 mg | ORAL_TABLET | Freq: Every day | ORAL | Status: DC
  Administered 2023-12-31 – 2024-01-02 (×3): 75 mg via ORAL
  Filled 2023-12-30 (×3): qty 1

## 2023-12-30 MED ORDER — SODIUM CHLORIDE 0.9% FLUSH
3.0000 mL | Freq: Two times a day (BID) | INTRAVENOUS | Status: DC
  Administered 2023-12-31 – 2024-01-02 (×6): 3 mL via INTRAVENOUS

## 2023-12-30 MED ORDER — SODIUM CHLORIDE 0.9 % IV SOLN: 250.0000 mL | INTRAVENOUS | Status: DC | PRN

## 2023-12-30 MED ORDER — LIDOCAINE HCL (PF) 1 % IJ SOLN
INTRAMUSCULAR | Status: DC | PRN
  Administered 2023-12-30: 13:00:00 5 mL

## 2023-12-30 MED ORDER — FENTANYL CITRATE (PF) 100 MCG/2ML IJ SOLN
INTRAMUSCULAR | Status: DC | PRN
  Administered 2023-12-30: 13:00:00 50 ug via INTRAVENOUS

## 2023-12-30 MED ORDER — PROTAMINE SULFATE 10 MG/ML IV SOLN
INTRAVENOUS | Status: DC | PRN
  Administered 2023-12-30: 14:00:00 80 mg via INTRAVENOUS

## 2023-12-30 MED ORDER — HYDRALAZINE HCL 20 MG/ML IJ SOLN: 5.0000 mg | INTRAMUSCULAR | Status: DC | PRN

## 2023-12-30 MED ORDER — FENTANYL CITRATE (PF) 100 MCG/2ML IJ SOLN
INTRAMUSCULAR | Status: AC
  Filled 2023-12-30: qty 2

## 2023-12-30 MED ORDER — LIDOCAINE HCL (PF) 1 % IJ SOLN
INTRAMUSCULAR | Status: AC
  Filled 2023-12-30: qty 30

## 2023-12-30 MED ORDER — HEPARIN SODIUM (PORCINE) 1000 UNIT/ML IJ SOLN
INTRAMUSCULAR | Status: AC
  Filled 2023-12-30: qty 10

## 2023-12-30 MED ORDER — ASPIRIN 81 MG PO TBEC: 81.0000 mg | DELAYED_RELEASE_TABLET | Freq: Every day | ORAL | Status: DC

## 2023-12-30 MED ORDER — MIDAZOLAM HCL (PF) 2 MG/2ML IJ SOLN
INTRAMUSCULAR | Status: DC | PRN
  Administered 2023-12-30: 13:00:00 1 mg via INTRAVENOUS

## 2023-12-30 MED ORDER — VANCOMYCIN HCL IN DEXTROSE 1-5 GM/200ML-% IV SOLN
1000.0000 mg | Freq: Two times a day (BID) | INTRAVENOUS | Status: DC
  Administered 2023-12-31 – 2024-01-01 (×2): 1000 mg via INTRAVENOUS
  Filled 2023-12-30 (×3): qty 200

## 2023-12-30 MED ORDER — HEPARIN (PORCINE) IN NACL 1000-0.9 UT/500ML-% IV SOLN
INTRAVENOUS | Status: DC | PRN
  Administered 2023-12-30: 14:00:00 1000 mL

## 2023-12-30 MED ORDER — ACETAMINOPHEN 325 MG PO TABS
650.0000 mg | ORAL_TABLET | ORAL | Status: DC | PRN
  Filled 2023-12-30: qty 2

## 2023-12-30 MED ORDER — SODIUM CHLORIDE 0.9 % IV SOLN: INTRAVENOUS | Status: DC

## 2023-12-30 MED ORDER — LABETALOL HCL 5 MG/ML IV SOLN: 10.0000 mg | INTRAVENOUS | Status: DC | PRN

## 2023-12-30 MED ORDER — SODIUM CHLORIDE 0.9 % WEIGHT BASED INFUSION: 1.0000 mL/kg/h | INTRAVENOUS | Status: AC

## 2023-12-30 MED ORDER — HEPARIN SODIUM (PORCINE) 1000 UNIT/ML IJ SOLN
INTRAMUSCULAR | Status: DC | PRN
  Administered 2023-12-30: 13:00:00 10000 [IU] via INTRAVENOUS

## 2023-12-30 MED ORDER — IODIXANOL 320 MG/ML IV SOLN
INTRAVENOUS | Status: DC | PRN
  Administered 2023-12-30: 14:00:00 70 mL

## 2023-12-30 MED ORDER — HYDRALAZINE HCL 20 MG/ML IJ SOLN
INTRAMUSCULAR | Status: AC
  Filled 2023-12-30: qty 1

## 2023-12-30 MED ORDER — PROTAMINE SULFATE 10 MG/ML IV SOLN
INTRAVENOUS | Status: AC
  Filled 2023-12-30: qty 10

## 2023-12-30 MED ORDER — HYDRALAZINE HCL 20 MG/ML IJ SOLN
INTRAMUSCULAR | Status: DC | PRN
  Administered 2023-12-30: 14:00:00 20 mg via INTRAVENOUS

## 2023-12-30 MED ORDER — PROTAMINE SULFATE 10 MG/ML IV SOLN
INTRAVENOUS | Status: AC
  Filled 2023-12-30: qty 5

## 2023-12-30 MED ORDER — MIDAZOLAM HCL 2 MG/2ML IJ SOLN
INTRAMUSCULAR | Status: AC
  Filled 2023-12-30: qty 2

## 2023-12-30 MED ADMIN — Gabapentin Cap 300 MG: 600 mg | ORAL | @ 22:00:00 | NDC 16714066202

## 2023-12-30 NOTE — Op Note (Signed)
 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  He is now admitted with a ulcer on his plantar surface as well as on the lateral aspect of the dorsal foot and ankle.  An ABI duplex was obtained which demonstrated decreased perfusion to the right foot with a distal SFA/popliteal stenosis.  Risks and benefits of angiogram with intervention were reviewed and he elected to proceed.  Findings:  Patent aorta bilateral renal arteries.  Widely patent iliac systems bilaterally.  Right common femoral, SFA and profunda are widely patent.  There is an approximate 60% stenosis in the mid SFA.  There are few lesions throughout the popliteal proximately 60% maximal stenosis.  There is three-vessel runoff to the foot.  There are tandem lesions in the AT and 70% stenosis.  Left common femoral and profunda are widely patent.  The SFA has some ratty disease but no flow-limiting stenosis.  The popliteal artery is severely diseased with a maximal 50% stenosis.  There is two-vessel runoff to the foot via the PT and peroneal.  The AT occludes shortly after its takeoff.  Procedure:  The patient was identified in the holding area and taken to the cath lab  The patient was then placed supine on the table and prepped and draped in the usual  sterile fashion.  A time out was called.  Ultrasound was used to evaluate the left common femoral artery.  It was patent .  A digital ultrasound image was acquired.  A micropuncture needle was used to access the left common femoral artery under ultrasound guidance.  An 018 wire was advanced without resistance and a micropuncture sheath was placed.  The 018 wire was removed and a benson wire was placed.  The micropuncture sheath was exchanged for a 5 french sheath.  An omniflush catheter was advanced over the wire to the level of L-1.  An abdominal angiogram was obtained.  Next, using the omniflush catheter and a glide advantage wire, the aortic bifurcation was crossed and the catheter was placed into theright external iliac artery and right runoff was obtained. This demonstrated the above findings.  The glide advantage wire was placed through the catheter and into the SFA.  The short 5 French sheath was then exchanged for a 6 French by 45 cm catapult sheath and the patient was systemically heparinized.  Using a V18 wire and a CXI catheter the lesions were crossed and the wire was placed into the distal AT.  The AT lesions were treated with a 3 mm x 100 mm Sterling balloon.  The popliteal lesions were treated with a 6 mm x 80 mm Ranger and the SFA lesion with a 6 mm x 40 mm Ranger.  A completion right leg angiogram demonstrated an excellent response in the treated segments with three-vessel runoff.  The V18 wire was removed and a Bentson  wire was replaced through the long sheath and this was exchanged for a short 6 French sheath.  right runoff was performed via retrograde sheath injections which demonstrated the above findings.  While attempting to place a Mynx closure device the short 6 French sheath kinked or unable to replace a wire or the closure device through this.  The sheath was then removed and manual pressure was held for approximately 20 minutes.  Contrast: 70 cc Sedation: 78  minutes  Impression: Right leg with inline flow to the right foot with three-vessel runoff.  Treatment of AT, popliteal and SFA lesions.  The left leg with inline flow through the PT and peroneal without flow-limiting stenosis.   Mitchell [name] Serve MD [facility] Office: [phone]

## 2023-12-30 NOTE — Progress Notes (Signed)
 Progress Note    Mitchell [name]  FMW:[id number] DOB: 1950-01-22  DOA: 12/26/2023 PCP: Maree Isles, MD      Brief Narrative:    Medical records reviewed and are as summarized below:  Mitchell [name] is a 73 y.o. male medical history significant for diabetes mellitus, right ray amputation of fourth patient presented to the ED with complaints of chronic ulcer to his right foot.  He noticed increasing redness and pain of the right foot extending up to the knees.  This has been going on for about a week. He said he had been on a clindamycin  recently for right foot ulcer but there was no improvement in symptoms.   He was initially admitted to [facility] from right foot cellulitis and osteomyelitis.  He was subsequently transferred to [facility] for further management.     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  (Class II obesity)   Right diabetic foot ulcer with cellulitis and osteomyelitis: Continue empiric IV antibiotics.  Analgesics as needed for pain. Arterial duplex showed 75 to 99% stenosis in the right superficial femoral artery and ABI showed moderate right lower extremity arterial disease.  Plan for right lower extremity angiography today.  Follow-up with vascular and orthopedic surgeon. 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 Serene   Type II DM: Continue NovoLog  as needed for hyperglycemia.   Tobacco use disorder: Counseled to quit.   Diet Order             Diet NPO time specified  Diet effective midnight                                  Consultants: Vascular surgeon Orthopedic surgeon  Procedures: None    Medications:    aspirin  EC  81 mg Oral Daily   enoxaparin  (LOVENOX ) injection  60 mg  Subcutaneous QHS   gabapentin   600 mg Oral TID   insulin  aspart  0-5 Units Subcutaneous QHS   insulin  aspart  0-9 Units Subcutaneous TID WC   nicotine   21 mg Transdermal Daily   rosuvastatin   5 mg Oral Daily   Continuous Infusions:  sodium chloride  100 mL/hr at 12/30/23 9347   ceFEPime  (MAXIPIME ) IV 2 g (12/30/23 0353)   vancomycin  1,000 mg (12/29/23 2317)     Anti-infectives (From admission, onward)    Start     Dose/Rate Route Frequency Ordered Stop   12/28/23 0000  vancomycin  (VANCOREADY) IVPB 1250 mg/250 mL  Status:  Discontinued       Placed in Followed by Linked Group   1,250 mg 166.7 mL/hr over 90 Minutes Intravenous Every 12 hours 12/27/23 1102 12/27/23 1110   12/28/23 0000  vancomycin  (VANCOCIN ) IVPB 1000 mg/200 mL premix       Placed in Followed by Linked Group   1,000 mg 200 mL/hr over 60 Minutes Intravenous Every 12 hours 12/27/23 1110     12/27/23 1200  vancomycin  (VANCOREADY) IVPB 2000 mg/400 mL  Status:  Discontinued       Placed in Followed by Linked Group   2,000 mg 200 mL/hr over 120 Minutes Intravenous  Once 12/27/23 1102 12/27/23 1110   12/27/23  1200  vancomycin  (VANCOREADY) IVPB 2000 mg/400 mL       Placed in Followed by Linked Group   2,000 mg 200 mL/hr over 120 Minutes Intravenous  Once 12/27/23 1110 12/27/23 1415   12/27/23 1130  ceFEPIme  (MAXIPIME ) 2 g in sodium chloride  0.9 % 100 mL IVPB        2 g 200 mL/hr over 30 Minutes Intravenous Every 8 hours 12/27/23 1049     12/26/23 1545  vancomycin  (VANCOCIN ) IVPB 1000 mg/200 mL premix        1,000 mg 200 mL/hr over 60 Minutes Intravenous  Once 12/26/23 1536 12/26/23 1709   12/26/23 1545  cefTRIAXone  (ROCEPHIN ) 2 g in sodium chloride  0.9 % 100 mL IVPB        2 g 200 mL/hr over 30 Minutes Intravenous  Once 12/26/23 1536 12/26/23 1637   12/26/23 1545  metroNIDAZOLE  (FLAGYL ) IVPB 500 mg        500 mg 100 mL/hr over 60 Minutes Intravenous  Once 12/26/23 1536 12/26/23 1706               Family Communication/Anticipated D/C date and plan/Code Status   DVT prophylaxis:      Code Status: Full Code  Family Communication: None Disposition Plan: Plan to discharge home   Status is: Inpatient Remains inpatient appropriate because: Right foot infection       Subjective:   Interval events noted.  He has no complaints.  He said he is in good spirits.  He said he is ready for surgery on his right foot when he has been told surgery scheduled for 12 PM today.  Objective:    Vitals:   12/29/23 2000 12/29/23 2300 12/30/23 0600 12/30/23 0841  BP: 132/68 (!) 162/52 (!) 132/57 (!) 156/87  Pulse: 63 66 68 70  Resp: 18  14 18   Temp: 97.9 F (36.6 C) 98.4 F (36.9 C)  97.8 F (36.6 C)  TempSrc: Oral Oral    SpO2: 98%  93% 91%  Weight:      Height:       No data found.   Intake/Output Summary (Last 24 hours) at 12/30/2023 1049 Last data filed at 12/30/2023 1023 Gross per 24 hour  Intake 1500 ml  Output 2250 ml  Net -750 ml   Filed Weights   12/26/23 1518  Weight: 117.9 kg    Exam:  GEN: NAD SKIN: Warm and dry EYES: No pallor or icterus ENT: MMM CV: RRR PULM: CTA B ABD: soft, ND, NT, +BS CNS: AAO x 3, non focal EXT: Distal right leg is erythematous and warm to touch.  Dressing on the right foot is clean, dry and intact.      Data Reviewed:   I have personally reviewed following labs and imaging studies:  Labs: Labs show the following:   Basic Metabolic Panel: Recent Labs  Lab 12/26/23 1550 12/27/23 0445 12/30/23 0440  NA 142 140 136  K 4.5 4.2 4.1  CL 102 103 104  CO2 27 30 25   GLUCOSE 48* 63* 124*  BUN 13 12 18   CREATININE 0.94 0.88 0.95  CALCIUM  9.1 8.5* 8.0*   GFR Estimated Creatinine Clearance: 91.2 mL/min (by C-G formula based on SCr of 0.95 mg/dL). Liver Function Tests: Recent Labs  Lab 12/26/23 1550  AST 21  ALT 16  ALKPHOS 109  BILITOT 0.4  PROT 7.8  ALBUMIN 3.8   No results for input(s):  LIPASE, AMYLASE in the last 168 hours. No results  for input(s): AMMONIA in the last 168 hours. Coagulation profile No results for input(s): INR, PROTIME in the last 168 hours.  CBC: Recent Labs  Lab 12/26/23 1550 12/27/23 0445  WBC 12.9* 10.9*  NEUTROABS 10.1*  --   HGB 13.9 13.0  HCT 45.5 41.6  MCV 90.3 89.5  PLT 305 275   Cardiac Enzymes: No results for input(s): CKTOTAL, CKMB, CKMBINDEX, TROPONINI in the last 168 hours. BNP (last 3 results) No results for input(s): PROBNP in the last 8760 hours. CBG: Recent Labs  Lab 12/28/23 2146 12/29/23 1138 12/29/23 1557 12/29/23 2234 12/30/23 0843  GLUCAP 138* 152* 120* 135* 109*   D-Dimer: No results for input(s): DDIMER in the last 72 hours. Hgb A1c: No results for input(s): HGBA1C in the last 72 hours.  Lipid Profile: No results for input(s): CHOL, HDL, LDLCALC, TRIG, CHOLHDL, LDLDIRECT in the last 72 hours. Thyroid  function studies: No results for input(s): TSH, T4TOTAL, T3FREE, THYROIDAB in the last 72 hours.  Invalid input(s): FREET3 Anemia work up: No results for input(s): VITAMINB12, FOLATE, FERRITIN, TIBC, IRON, RETICCTPCT in the last 72 hours. Sepsis Labs: Recent Labs  Lab 12/26/23 1550 12/27/23 0445  WBC 12.9* 10.9*  LATICACIDVEN 1.6  --     Microbiology Recent Results (from the past 240 hours)  Culture, blood (routine x 2)     Status: None (Preliminary result)   Collection Time: 12/26/23  3:50 PM   Specimen: BLOOD  Result Value Ref Range Status   Specimen Description BLOOD RIGHT ANTECUBITAL  Final   Special Requests   Final    BOTTLES DRAWN AEROBIC AND ANAEROBIC Blood Culture adequate volume   Culture   Final    NO GROWTH 4 DAYS Performed at [facility], [address], [place], [place] [address]    Report Status PENDING  Incomplete  Culture, blood (routine x 2)     Status: None (Preliminary result)   Collection Time: 12/26/23  3:54 PM    Specimen: BLOOD  Result Value Ref Range Status   Specimen Description BLOOD BLOOD RIGHT FOREARM  Final   Special Requests   Final    BOTTLES DRAWN AEROBIC AND ANAEROBIC Blood Culture adequate volume   Culture   Final    NO GROWTH 4 DAYS Performed at [facility], [address], [place], [place] [address]    Report Status PENDING  Incomplete    Procedures and diagnostic studies:  VAS US  LOWER EXTREMITY ARTERIAL DUPLEX Result Date: 12/29/2023 LOWER EXTREMITY ARTERIAL DUPLEX STUDY Patient Name:  Mitchell [name]  Date of Exam:   12/29/2023 Medical Rec #: [id number]   Accession #:    [id number] Date of Birth: 06-14-1950   Patient Gender: M Patient Age:   49 years Exam Location:  [facility] Procedure:      VAS US  LOWER EXTREMITY ARTERIAL DUPLEX Referring Phys: GAILE NEW --------------------------------------------------------------------------------  Indications: Right transmetatarsal amputation, 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 High Risk Factors: Hypertension, Diabetes, current smoker.  Chronic ulceration with new cellulitis and probable  osteomyelitis.  Vascular Interventions: Aortogram 07/22/15 indicating widely patent aortoiliac,                         SFA, pop, and tibial arteries bilaterally. Current ABI:            R: 0.77 L:1.03/0.46 (TBI) Comparison Study: No prior study on file Performing Technologist: Alberta Lis RVS  Examination Guidelines: A complete evaluation includes B-mode imaging, spectral Doppler, color Doppler, and power Doppler as  needed of all accessible portions of each vessel. Bilateral testing is considered an integral part of a complete examination. Limited examinations for reoccurring indications may be performed as noted.  +----------+--------+-----+---------------+----------+-------------------------+ RIGHT     PSV cm/sRatioStenosis       Waveform  Comments                  +----------+--------+-----+---------------+----------+-------------------------+ CFA Prox  181                          biphasic                            +----------+--------+-----+---------------+----------+-------------------------+ CFA Distal111                         monophasic                          +----------+--------+-----+---------------+----------+-------------------------+ DFA       109                         monophasic                          +----------+--------+-----+---------------+----------+-------------------------+ SFA Prox  123                         monophasic                          +----------+--------+-----+---------------+----------+-------------------------+ SFA Mid   451          75-99% stenosismonophasic                          +----------+--------+-----+---------------+----------+-------------------------+ SFA Distal204          50-74% stenosismonophasic                          +----------+--------+-----+---------------+----------+-------------------------+ POP Prox  192          30-49% stenosismonophasic                          +----------+--------+-----+---------------+----------+-------------------------+ POP Mid   188          30-49% stenosismonophasic                          +----------+--------+-----+---------------+----------+-------------------------+ POP Distal174          30-49% stenosismonophasic                          +----------+--------+-----+---------------+----------+-------------------------+ TP Trunk  124                         monophasic                          +----------+--------+-----+---------------+----------+-------------------------+ ATA Prox  55                          monophasic                          +----------+--------+-----+---------------+----------+-------------------------+  ATA Mid   103                         monophasiccollaterals               +----------+--------+-----+---------------+----------+-------------------------+ ATA Distal37                           monophasic                          +----------+--------+-----+---------------+----------+-------------------------+ PTA Prox  127                         monophasic                          +----------+--------+-----+---------------+----------+-------------------------+ PTA Mid   59                          monophasiccollaterals               +----------+--------+-----+---------------+----------+-------------------------+ PTA Distal77                          monophasic                          +----------+--------+-----+---------------+----------+-------------------------+ PERO Prox                                       not visualized secondary                                                  to edema                  +----------+--------+-----+---------------+----------+-------------------------+  +-----------+--------+-----+---------------+----------+-----------+ LEFT       PSV cm/sRatioStenosis       Waveform  Comments    +-----------+--------+-----+---------------+----------+-----------+ CFA Prox   123                         biphasic              +-----------+--------+-----+---------------+----------+-----------+ CFA Distal 144                         biphasic              +-----------+--------+-----+---------------+----------+-----------+ DFA        128                         biphasic              +-----------+--------+-----+---------------+----------+-----------+ SFA Prox   166          30-49% stenosisbiphasic              +-----------+--------+-----+---------------+----------+-----------+ SFA Mid    153          30-49% stenosisbiphasic              +-----------+--------+-----+---------------+----------+-----------+ SFA Distal 164          30-49% stenosisbiphasic              +-----------+--------+-----+---------------+----------+-----------+  POP Prox   273          50-74% stenosisbiphasic               +-----------+--------+-----+---------------+----------+-----------+ POP Mid    211                         biphasic              +-----------+--------+-----+---------------+----------+-----------+ POP Distal 115                         biphasic              +-----------+--------+-----+---------------+----------+-----------+ TP Trunk   87                          biphasic              +-----------+--------+-----+---------------+----------+-----------+ ATA Prox   62                          biphasic              +-----------+--------+-----+---------------+----------+-----------+ ATA Mid    22                          monophasic            +-----------+--------+-----+---------------+----------+-----------+ ATA Distal 20                          monophasiccollaterals +-----------+--------+-----+---------------+----------+-----------+ PTA Prox   77                          biphasic              +-----------+--------+-----+---------------+----------+-----------+ PTA Mid    93                          biphasic              +-----------+--------+-----+---------------+----------+-----------+ PTA Distal 91                          biphasic              +-----------+--------+-----+---------------+----------+-----------+ PERO Prox  57                          biphasic              +-----------+--------+-----+---------------+----------+-----------+ PERO Mid   63                          biphasic              +-----------+--------+-----+---------------+----------+-----------+ PERO Distal86                          biphasic              +-----------+--------+-----+---------------+----------+-----------+  Summary: Right: 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 50-74% stenosis noted in the distal superficial femoral artery/proximal popliteal artery. 30-49% stenosis noted in the popliteal artery. Diffuse calcific plaque noted  throughout the right lower extremity Left: 30-49% stenosis noted in the superficial femoral artery. 50-74% stenosis noted in the  popliteal artery. Diffuse calcific plaque noted throughout the left lower extremity.  See table(s) above for measurements and observations. Electronically signed by Debby Robertson on 12/29/2023 at 8:15:25 PM.    Final    VAS US  ABI WITH/WO TBI Result Date: 12/29/2023  LOWER EXTREMITY DOPPLER STUDY Patient Name:  Mitchell [name]  Date of Exam:   12/29/2023 Medical Rec #: [id number]   Accession #:    [id number] Date of Birth: 08-13-1950   Patient Gender: M Patient Age:   77 years Exam Location:  [facility] Procedure:      VAS US  ABI WITH/WO TBI Referring Phys: GAILE NEW --------------------------------------------------------------------------------  Indications: Chronic ulceration with new cellulitis and probable osteomyelitis High Risk Factors: Hypertension, Diabetes, current smoker. Other Factors: Medication noncompliance secondary to cost.  Vascular Interventions: Right transmetatarsal amputation 10/28/20. Aortogram                         07/22/15 indicating widely patent aortoiliac, SFA,                         popliteal, and tibial arteries bilaterally. Limitations: Today's exam was limited due to bandages, Edema, pain with cuff              pressure, right calf. Comparison Study: No prior ABI on file Performing Technologist: Rachel Pellet RVS  Examination Guidelines: A complete evaluation includes at minimum, Doppler waveform signals and systolic blood pressure reading at the level of bilateral brachial, anterior tibial, and posterior tibial arteries, when vessel segments are accessible. Bilateral testing is considered an integral part of a complete examination. Photoelectric Plethysmograph (PPG) waveforms and toe systolic pressure readings are included as required and additional duplex testing as needed. Limited examinations for reoccurring indications may be performed as  noted.  ABI Findings: +---------+------------------+-----+---------+----------+ Right    Rt Pressure (mmHg)IndexWaveform Comment    +---------+------------------+-----+---------+----------+ Brachial 148                    triphasic           +---------+------------------+-----+---------+----------+ PTA      125               0.77 biphasic            +---------+------------------+-----+---------+----------+ DP       106               0.65 biphasic            +---------+------------------+-----+---------+----------+ Great Toe                                amputation +---------+------------------+-----+---------+----------+ +---------+------------------+-----+---------+-------+ Left     Lt Pressure (mmHg)IndexWaveform Comment +---------+------------------+-----+---------+-------+ Brachial 162                    triphasic        +---------+------------------+-----+---------+-------+ PTA      160               0.99 biphasic         +---------+------------------+-----+---------+-------+ DP       167               1.03 biphasic         +---------+------------------+-----+---------+-------+ Great Toe75                0.46                  +---------+------------------+-----+---------+-------+ +-------+-----------+-----------+------------+------------+  ABI/TBIToday's ABIToday's TBIPrevious ABIPrevious TBI +-------+-----------+-----------+------------+------------+ Right  0.77       amputation                          +-------+-----------+-----------+------------+------------+ Left   1.03       0.46                                +-------+-----------+-----------+------------+------------+  Summary: Right: Resting right ankle-brachial index indicates moderate right lower extremity arterial disease.  Left: Resting left ankle-brachial index is within normal range. Left toe pressure is >60 mmHg which suggests adequate perfusion for healing. *See  table(s) above for measurements and observations.  Electronically signed by Debby Robertson on 12/29/2023 at 8:14:43 PM.    Final                LOS: 4 days   Erikah Thumm  [facility]   Pager on [email or web address]. If 7PM-7AM, please contact night-coverage at [email or web address]     12/30/2023, 10:49 AM

## 2023-12-30 NOTE — Plan of Care (Signed)
°  Problem: Metabolic: Goal: Ability to maintain appropriate glucose levels will improve Outcome: Progressing   Problem: Nutritional: Goal: Progress toward achieving an optimal weight will improve Outcome: Progressing   Problem: Tissue Perfusion: Goal: Adequacy of tissue perfusion will improve Outcome: Progressing

## 2023-12-30 NOTE — Consult Note (Signed)
 ORTHOPAEDIC CONSULTATION  REQUESTING PHYSICIAN: Jens Durand, MD  Chief Complaint: Chronic ulcer right foot and right ankle.  HPI: Mitchell [name] is a 73 y.o. male who presents with chronic ulceration right ankle and right foot.  Patient has a history of diabetes with peripheral vascular disease status post previous endovascular intervention.  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 Macie.   Hypercholesterolemia    IDA (iron deficiency anemia)    Maggot infestation    right foot ulcer   Obesity 05/09/2014   Pneumonia    Tobacco abuse 07/07/2014   Past Surgical History:  Procedure Laterality Date   APPENDECTOMY     APPLICATION OF WOUND VAC Right 10/27/2020   Procedure: APPLICATION OF WOUND VAC;  Surgeon: Serene Gaile ORN, MD;  Location: [facility] OR;  Service: Vascular;  Laterality: Right;   ESOPHAGOGASTRODUODENOSCOPY (EGD) WITH PROPOFOL  N/A 10/28/2020   Procedure: ESOPHAGOGASTRODUODENOSCOPY (EGD) WITH PROPOFOL ;  Surgeon: Federico Rosario BROCKS, MD;  Location: [facility] ENDOSCOPY;  Service: Gastroenterology;  Laterality: N/A;   HEMOSTASIS CLIP PLACEMENT  10/28/2020   Procedure: HEMOSTASIS CLIP PLACEMENT;  Surgeon: Federico Rosario BROCKS, MD;  Location: [facility] ENDOSCOPY;  Service: Gastroenterology;;   HOT HEMOSTASIS N/A 10/28/2020   Procedure: HOT HEMOSTASIS (ARGON PLASMA COAGULATION/BICAP);  Surgeon: Federico Rosario BROCKS, MD;  Location: [facility] ENDOSCOPY;  Service: Gastroenterology;  Laterality: N/A;   MICROLARYNGOSCOPY Bilateral 03/02/2022   Procedure: MICROLARYNGOSCOPY AND BIOPSY;  Surgeon: Llewellyn Gerard LABOR, DO;  Location: [facility] OR;  Service: ENT;  Laterality: Bilateral;    PERIPHERAL VASCULAR CATHETERIZATION N/A 07/22/2015   Procedure: Abdominal Aortogram w/Lower Extremity;  Surgeon: Carlin FORBES Haddock, MD;  Location: [facility] INVASIVE CV LAB;  Service: Cardiovascular;  Laterality: N/A;   TRANSMETATARSAL AMPUTATION Right 10/27/2020   Procedure: TRANSMETATARSAL AMPUTATION;  Surgeon: Serene Gaile ORN, MD;  Location: [facility] OR;  Service: Vascular;  Laterality: Right;   Social History   Socioeconomic History   Marital status: Divorced    Spouse name: Not on file   Number of children: Not on file   Years of education: Not on file   Highest education level: Not on file  Occupational History   Not on file  Tobacco Use   Smoking status: Every Day    Current packs/day: 0.75    Types: Cigarettes   Smokeless tobacco: Never  Vaping Use   Vaping status: Never Used  Substance and Sexual Activity   Alcohol  use: Yes    Alcohol /week: 0.0 standard drinks of alcohol     Comment: occ-twice a week beer approx 2   Drug use: No   Sexual activity: Not on file  Other Topics Concern   Not on file  Social History Narrative   Not on file   Social Drivers of Health   Tobacco Use: High Risk (12/26/2023)   Patient History    Smoking Tobacco Use: Every Day    Smokeless Tobacco Use: Never    Passive Exposure: Not on file  Financial Resource Strain: Not on file  Food Insecurity: No Food Insecurity (12/26/2023)   Epic    Worried About Radiation Protection Practitioner of Food in the Last Year: Never true  Ran Out of Food in the Last Year: Never true  Transportation Needs: No Transportation Needs (12/26/2023)   Epic    Lack of Transportation (Medical): No    Lack of Transportation (Non-Medical): No  Physical Activity: Not on file  Stress: Not on file  Social Connections: Moderately Isolated (12/26/2023)   Social Connection and Isolation Panel    Frequency of Communication with Friends and Family: More than three times a week    Frequency of Social Gatherings with Friends and Family: More than three  times a week    Attends Religious Services: More than 4 times per year    Active Member of [employer] or Organizations: No    Attends Banker Meetings: Never    Marital Status: Divorced  Depression (PHQ2-9): Not on file  Alcohol  Screen: Not on file  Housing: Low Risk (12/26/2023)   Epic    Unable to Pay for Housing in the Last Year: No    Number of Times Moved in the Last Year: 0    Homeless in the Last Year: No  Utilities: Not At Risk (12/26/2023)   Epic    Threatened with loss of utilities: No  Health Literacy: Not on file   History reviewed. No pertinent family history. - negative except otherwise stated in the family history section Allergies[1] Prior to Admission medications  Medication Sig Start Date End Date Taking? Authorizing Provider  aspirin  EC 81 MG tablet Take 81 mg by mouth daily.   Yes [provider]  cholecalciferol (VITAMIN D3) 25 MCG (1000 UNIT) tablet Take 1,000 Units by mouth daily.   Yes [provider]  clindamycin  (CLEOCIN ) 300 MG capsule Take 300 mg by mouth 3 (three) times daily.   Yes [provider]  ferrous sulfate  325 (65 FE) MG tablet Take 1 tablet (325 mg total) by mouth daily with breakfast. 12/06/20  Yes Federico Rosario BROCKS, MD  FIASP  FLEXTOUCH 100 UNIT/ML FlexTouch Pen INJECT 60-100 UNITS :INJECT 20 UNITS SUBCUTANEOUSLY IN THE MORNING, 10 UNITS AT NOON, AND 20 UNITS IN THE EVENING; MAX DAILY DOSE:100 UNITS   Yes [provider]  furosemide  (LASIX ) 20 MG tablet Take 1 tablet (20 mg total) by mouth daily. 03/22/13  Yes Jadine Toribio SQUIBB, MD  gabapentin  (NEURONTIN ) 600 MG tablet Take 600 mg by mouth 3 (three) times daily. 11/11/23  Yes [provider]  JARDIANCE  25 MG TABS tablet Take 25 mg by mouth daily.  05/12/18  Yes [provider]  OZEMPIC, 2 MG/DOSE, 8 MG/3ML SOPN Inject 2 mg into the skin every Friday.   Yes [provider]  rosuvastatin  (CRESTOR ) 5 MG tablet Take 5 mg by mouth  daily. 02/07/22  Yes [provider]  TRESIBA FLEXTOUCH 200 UNIT/ML SOPN Inject 10-20 Units into the skin See admin instructions. Take 20 units in the morning and night and 10 in the afternoon 06/24/15  Yes [provider]   VAS US  LOWER EXTREMITY ARTERIAL DUPLEX Result Date: 12/29/2023 LOWER EXTREMITY ARTERIAL DUPLEX STUDY Patient Name:  UEL DAVIDOW  Date of Exam:   12/29/2023 Medical Rec #: [id number]   Accession #:    [id number] Date of Birth: Mar 25, 1950   Patient Gender: M Patient Age:   73 years Exam Location:  [facility] Procedure:      VAS US  LOWER EXTREMITY ARTERIAL DUPLEX Referring Phys: GAILE NEW --------------------------------------------------------------------------------  Indications: Right transmetatarsal amputation, 10/28/2020. High Risk Factors: Hypertension, Diabetes, current smoker.  Chronic ulceration with new cellulitis and probable  osteomyelitis.  Vascular Interventions: Aortogram 07/22/15 indicating widely patent aortoiliac,                         SFA, pop, and tibial arteries bilaterally. Current ABI:            R: 0.77 L:1.03/0.46 (TBI) Comparison Study: No prior study on file Performing Technologist: Alberta Lis RVS  Examination Guidelines: A complete evaluation includes B-mode imaging, spectral Doppler, color Doppler, and power Doppler as needed of all accessible portions of each vessel. Bilateral testing is considered an integral part of a complete examination. Limited examinations for reoccurring indications may be performed as noted.  +----------+--------+-----+---------------+----------+-------------------------+ RIGHT     PSV cm/sRatioStenosis       Waveform  Comments                  +----------+--------+-----+---------------+----------+-------------------------+ CFA Prox  181                         biphasic                            +----------+--------+-----+---------------+----------+-------------------------+ CFA Distal111                          monophasic                          +----------+--------+-----+---------------+----------+-------------------------+ DFA       109                         monophasic                          +----------+--------+-----+---------------+----------+-------------------------+ SFA Prox  123                         monophasic                          +----------+--------+-----+---------------+----------+-------------------------+ SFA Mid   451          75-99% stenosismonophasic                          +----------+--------+-----+---------------+----------+-------------------------+ SFA Distal204          50-74% stenosismonophasic                          +----------+--------+-----+---------------+----------+-------------------------+ POP Prox  192          30-49% stenosismonophasic                          +----------+--------+-----+---------------+----------+-------------------------+ POP Mid   188          30-49% stenosismonophasic                          +----------+--------+-----+---------------+----------+-------------------------+ POP Distal174          30-49% stenosismonophasic                          +----------+--------+-----+---------------+----------+-------------------------+ TP Trunk  124  monophasic                          +----------+--------+-----+---------------+----------+-------------------------+ ATA Prox  55                          monophasic                          +----------+--------+-----+---------------+----------+-------------------------+ ATA Mid   103                         monophasiccollaterals               +----------+--------+-----+---------------+----------+-------------------------+ ATA Distal37                          monophasic                          +----------+--------+-----+---------------+----------+-------------------------+ PTA Prox  127                          monophasic                          +----------+--------+-----+---------------+----------+-------------------------+ PTA Mid   59                          monophasiccollaterals               +----------+--------+-----+---------------+----------+-------------------------+ PTA Distal77                          monophasic                          +----------+--------+-----+---------------+----------+-------------------------+ PERO Prox                                       not visualized secondary                                                  to edema                  +----------+--------+-----+---------------+----------+-------------------------+  +-----------+--------+-----+---------------+----------+-----------+ LEFT       PSV cm/sRatioStenosis       Waveform  Comments    +-----------+--------+-----+---------------+----------+-----------+ CFA Prox   123                         biphasic              +-----------+--------+-----+---------------+----------+-----------+ CFA Distal 144                         biphasic              +-----------+--------+-----+---------------+----------+-----------+ DFA        128                         biphasic              +-----------+--------+-----+---------------+----------+-----------+  SFA Prox   166          30-49% stenosisbiphasic              +-----------+--------+-----+---------------+----------+-----------+ SFA Mid    153          30-49% stenosisbiphasic              +-----------+--------+-----+---------------+----------+-----------+ SFA Distal 164          30-49% stenosisbiphasic              +-----------+--------+-----+---------------+----------+-----------+ POP Prox   273          50-74% stenosisbiphasic              +-----------+--------+-----+---------------+----------+-----------+ POP Mid    211                         biphasic               +-----------+--------+-----+---------------+----------+-----------+ POP Distal 115                         biphasic              +-----------+--------+-----+---------------+----------+-----------+ TP Trunk   87                          biphasic              +-----------+--------+-----+---------------+----------+-----------+ ATA Prox   62                          biphasic              +-----------+--------+-----+---------------+----------+-----------+ ATA Mid    22                          monophasic            +-----------+--------+-----+---------------+----------+-----------+ ATA Distal 20                          monophasiccollaterals +-----------+--------+-----+---------------+----------+-----------+ PTA Prox   77                          biphasic              +-----------+--------+-----+---------------+----------+-----------+ PTA Mid    93                          biphasic              +-----------+--------+-----+---------------+----------+-----------+ PTA Distal 91                          biphasic              +-----------+--------+-----+---------------+----------+-----------+ PERO Prox  57                          biphasic              +-----------+--------+-----+---------------+----------+-----------+ PERO Mid   63                          biphasic              +-----------+--------+-----+---------------+----------+-----------+ PERO Distal86  biphasic              +-----------+--------+-----+---------------+----------+-----------+  Summary: Right: 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 50-74% stenosis noted in the distal superficial femoral artery/proximal popliteal artery. 30-49% stenosis noted in the popliteal artery. Diffuse calcific plaque noted throughout the right lower extremity Left: 30-49% stenosis noted in the superficial femoral artery. 50-74% stenosis noted in the popliteal  artery. Diffuse calcific plaque noted throughout the left lower extremity.  See table(s) above for measurements and observations. Electronically signed by Debby Robertson on 12/29/2023 at 8:15:25 PM.    Final    VAS US  ABI WITH/WO TBI Result Date: 12/29/2023  LOWER EXTREMITY DOPPLER STUDY Patient Name:  Mitchell [name]  Date of Exam:   12/29/2023 Medical Rec #: [id number]   Accession #:    [id number] Date of Birth: 06-Aug-1950   Patient Gender: M Patient Age:   64 years Exam Location:  [facility] Procedure:      VAS US  ABI WITH/WO TBI Referring Phys: GAILE NEW --------------------------------------------------------------------------------  Indications: Chronic ulceration with new cellulitis and probable osteomyelitis High Risk Factors: Hypertension, Diabetes, current smoker. Other Factors: Medication noncompliance secondary to cost.  Vascular Interventions: Right transmetatarsal amputation 10/28/20. Aortogram                         07/22/15 indicating widely patent aortoiliac, SFA,                         popliteal, and tibial arteries bilaterally. Limitations: Today's exam was limited due to bandages, Edema, pain with cuff              pressure, right calf. Comparison Study: No prior ABI on file Performing Technologist: Rachel Pellet RVS  Examination Guidelines: A complete evaluation includes at minimum, Doppler waveform signals and systolic blood pressure reading at the level of bilateral brachial, anterior tibial, and posterior tibial arteries, when vessel segments are accessible. Bilateral testing is considered an integral part of a complete examination. Photoelectric Plethysmograph (PPG) waveforms and toe systolic pressure readings are included as required and additional duplex testing as needed. Limited examinations for reoccurring indications may be performed as noted.  ABI Findings: +---------+------------------+-----+---------+----------+ Right    Rt Pressure (mmHg)IndexWaveform Comment     +---------+------------------+-----+---------+----------+ Brachial 148                    triphasic           +---------+------------------+-----+---------+----------+ PTA      125               0.77 biphasic            +---------+------------------+-----+---------+----------+ DP       106               0.65 biphasic            +---------+------------------+-----+---------+----------+ Great Toe                                amputation +---------+------------------+-----+---------+----------+ +---------+------------------+-----+---------+-------+ Left     Lt Pressure (mmHg)IndexWaveform Comment +---------+------------------+-----+---------+-------+ Brachial 162                    triphasic        +---------+------------------+-----+---------+-------+ PTA      160  0.99 biphasic         +---------+------------------+-----+---------+-------+ DP       167               1.03 biphasic         +---------+------------------+-----+---------+-------+ Great Toe75                0.46                  +---------+------------------+-----+---------+-------+ +-------+-----------+-----------+------------+------------+ ABI/TBIToday's ABIToday's TBIPrevious ABIPrevious TBI +-------+-----------+-----------+------------+------------+ Right  0.77       amputation                          +-------+-----------+-----------+------------+------------+ Left   1.03       0.46                                +-------+-----------+-----------+------------+------------+  Summary: Right: Resting right ankle-brachial index indicates moderate right lower extremity arterial disease.  Left: Resting left ankle-brachial index is within normal range. Left toe pressure is >60 mmHg which suggests adequate perfusion for healing. *See table(s) above for measurements and observations.  Electronically signed by Debby Robertson on 12/29/2023 at 8:14:43 PM.    Final    - pertinent  xrays, CT, MRI studies were reviewed and independently interpreted  Positive ROS: All other systems have been reviewed and were otherwise negative with the exception of those mentioned in the HPI and as above.  Physical Exam: General: Alert, no acute distress Psychiatric: Patient is competent for consent with normal mood and affect Lymphatic: No axillary or cervical lymphadenopathy Cardiovascular: No pedal edema Respiratory: No cyanosis, no use of accessory musculature GI: No organomegaly, abdomen is soft and non-tender    Images:  @ENCIMAGES @  Labs:  Lab Results  Component Value Date   HGBA1C 5.4 12/26/2023   HGBA1C 6.3 (H) 10/25/2020   HGBA1C 9.9 (H) 07/07/2014   ESRSEDRATE 50 (H) 10/26/2020   ESRSEDRATE 33 (H) 05/10/2014   CRP 2.4 (H) 10/25/2020   CRP 4.6 (H) 05/10/2014   REPTSTATUS PENDING 12/26/2023   GRAMSTAIN  10/27/2020    FEW WBC PRESENT, PREDOMINANTLY MONONUCLEAR FEW GRAM POSITIVE COCCI IN PAIRS RARE GRAM VARIABLE ROD Performed at [facility], [address], [place], [place] [address]    CULT  12/26/2023    NO GROWTH 3 DAYS Performed at [facility], [address] [place], [place] [address]    [facility] ENTEROCOCCUS FAECALIS 10/27/2020    Lab Results  Component Value Date   ALBUMIN 3.8 12/26/2023   ALBUMIN 2.8 (L) 10/25/2020   ALBUMIN 3.0 (L) 07/08/2014        Latest Ref Rng & Units 12/27/2023    4:45 AM 12/26/2023    3:50 PM 03/02/2022    6:22 AM  CBC EXTENDED  WBC 4.0 - 10.5 K/uL 10.9  12.9  10.5   RBC 4.22 - 5.81 MIL/uL 4.65  5.04  5.74   Hemoglobin 13.0 - 17.0 g/dL 86.9  86.0  82.9   HCT 39.0 - 52.0 % 41.6  45.5  52.3   Platelets 150 - 400 K/uL 275  305  171   NEUT# 1.7 - 7.7 K/uL  10.1    Lymph# 0.7 - 4.0 K/uL  1.4      Neurologic: Patient does not have protective sensation bilateral lower extremities.   MUSCULOSKELETAL:   Skin: Examination patient has a chronic  ulcer plantar aspect of the right foot with tunneling down to  the cuboid.  There is also chronic ulceration around the ankle with cellulitis of the foot and ankle.  Review of the MRI scan shows ulcer that extends down to the cuboid with osteomyelitis of the cuboid.  Ankle-brachial indices shows a left great toe pressure of 75 and a ankle-brachial indices on the right of 0.77 with biphasic flow at the ankle.  White cell count 10.9.  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  Plan: Plan: Recommend patient proceed with a below-knee amputation.  Discussed that there are not be further foot salvage intervention options that would allow him to be ambulatory.  Patient was in agreement with proceeding with a transtibial amputation.  Thank you for the consult and the opportunity to see Mr. Yeison Sippel, MD [facility] [phone] 7:34 AM        [1]  Allergies Allergen Reactions   Penicillins Itching, Dermatitis and Rash    Immediate rash, facial/tongue/throat swelling, SOB or lightheadedness with hypotension  Has tolerate multiple cephalosporins in the past

## 2023-12-30 NOTE — Progress Notes (Signed)
 Patient received form cath lab, AO x4. Connected to tele, ccmd notified. In bed rest till 1804. Left groin cath site level zero, gauze dressing intact. Oriented patient to room and call bell system. Call bell within reach. Plan of care continues.

## 2023-12-31 ENCOUNTER — Encounter (HOSPITAL_COMMUNITY): Payer: Self-pay | Admitting: Vascular Surgery

## 2023-12-31 DIAGNOSIS — Z95820 Peripheral vascular angioplasty status with implants and grafts: Secondary | ICD-10-CM | POA: Diagnosis not present

## 2023-12-31 LAB — GLUCOSE, CAPILLARY
Glucose-Capillary: 115 mg/dL — ABNORMAL HIGH (ref 70–99)
Glucose-Capillary: 201 mg/dL — ABNORMAL HIGH (ref 70–99)
Glucose-Capillary: 167 mg/dL — ABNORMAL HIGH (ref 70–99)
Glucose-Capillary: 171 mg/dL — ABNORMAL HIGH (ref 70–99)

## 2023-12-31 LAB — LIPID PANEL
Cholesterol: 111 mg/dL (ref 0–200)
HDL: 29 mg/dL — ABNORMAL LOW (ref >40)
LDL Cholesterol: 55 mg/dL (ref 0–99)
Total CHOL/HDL Ratio: 3.8 ratio
Triglycerides: 137 mg/dL (ref <150)
VLDL: 27 mg/dL (ref 0–40)

## 2023-12-31 LAB — CBC
HCT: 38.5 % — ABNORMAL LOW (ref 39.0–52.0)
Hemoglobin: 12.2 g/dL — ABNORMAL LOW (ref 13.0–17.0)
MCH: 27.6 pg (ref 26.0–34.0)
MCHC: 31.7 g/dL (ref 30.0–36.0)
MCV: 87.1 fL (ref 80.0–100.0)
Platelets: 280 K/uL (ref 150–400)
RBC: 4.42 MIL/uL (ref 4.22–5.81)
RDW: 14.3 % (ref 11.5–15.5)
WBC: 8.1 K/uL (ref 4.0–10.5)
nRBC: 0 % (ref 0.0–0.2)

## 2023-12-31 LAB — CULTURE, BLOOD (ROUTINE X 2)
Culture: NO GROWTH
Culture: NO GROWTH
Special Requests: ADEQUATE
Special Requests: ADEQUATE

## 2023-12-31 MED ADMIN — Gabapentin Cap 300 MG: 600 mg | ORAL | @ 18:00:00 | NDC 16714066202

## 2023-12-31 MED ADMIN — Aspirin Tab Delayed Release 81 MG: 81 mg | ORAL | @ 10:00:00 | NDC 69618006610

## 2023-12-31 MED ADMIN — Gabapentin Cap 300 MG: 600 mg | ORAL | @ 10:00:00 | NDC 16714066202

## 2023-12-31 MED ADMIN — Gabapentin Cap 300 MG: 600 mg | ORAL | @ 22:00:00 | NDC 16714066202

## 2023-12-31 NOTE — Progress Notes (Addendum)
°  Progress Note    12/31/2023 8:50 AM 1 Day Post-Op  Subjective: No complaints    Vitals:   12/31/23 0404 12/31/23 0735  BP: (!) 120/46 (!) 143/53  Pulse: (!) 59 67  Resp: 18 17  Temp: 98.3 F (36.8 C) 98.2 F (36.8 C)  SpO2: 95% 95%    Physical Exam: General: Resting in bed Cardiac: Regular Lungs: Nonlabored Incisions: Left groin cath site soft without hematoma Extremities: Right TMA dressed and dry  CBC    Component Value Date/Time   WBC 8.1 12/31/2023 0417   RBC 4.42 12/31/2023 0417   HGB 12.2 (L) 12/31/2023 0417   HCT 38.5 (L) 12/31/2023 0417   PLT 280 12/31/2023 0417   MCV 87.1 12/31/2023 0417   MCH 27.6 12/31/2023 0417   MCHC 31.7 12/31/2023 0417   RDW 14.3 12/31/2023 0417   LYMPHSABS 1.4 12/26/2023 1550   MONOABS 1.0 12/26/2023 1550   EOSABS 0.2 12/26/2023 1550   BASOSABS 0.1 12/26/2023 1550    BMET    Component Value Date/Time   NA 136 12/30/2023 0440   K 4.1 12/30/2023 0440   CL 104 12/30/2023 0440   CO2 25 12/30/2023 0440   GLUCOSE 124 (H) 12/30/2023 0440   BUN 18 12/30/2023 0440   CREATININE 0.95 12/30/2023 0440   CALCIUM  8.0 (L) 12/30/2023 0440   GFRNONAA >60 12/30/2023 0440   GFRAA >60 08/29/2014 1627    INR    Component Value Date/Time   INR 1.2 10/25/2020 1550     Intake/Output Summary (Last 24 hours) at 12/31/2023 0850 Last data filed at 12/30/2023 1023 Gross per 24 hour  Intake --  Output 500 ml  Net -500 ml      Assessment/Plan:  73 y.o. male is 1 day postop, s/p: Right lower extremity angiogram with right AT balloon angioplasty and drug-coated balloon angioplasty of the right popliteal artery and SFA   - He is doing well this morning without any complaints.  He denies any pain in his right foot -Left groin cath site is soft without hematoma -Right lower extremity is maximally revascularized with inline flow to the foot with three-vessel runoff -Continue aspirin , Plavix , and statin.  Stable for discharge from  vascular perspective.  Will arrange follow-up with our office in 4 to 6 weeks with right lower extremity arterial duplex and ABIs   Ahmed Holster, PA-C [facility] [phone] 12/31/2023 8:50 AM   VASCULAR STAFF ADDENDUM: I have independently interviewed and examined the patient. I agree with the above.    Norman GORMAN Serve MD [facility] Phone Number: [phone] 12/31/2023 11:36 AM

## 2023-12-31 NOTE — Plan of Care (Signed)

## 2023-12-31 NOTE — Progress Notes (Signed)
 RN awaiting orders placed for wound care products

## 2023-12-31 NOTE — Progress Notes (Signed)
 Progress Note    Mitchell [name]  FMW:[id number] DOB: 05-04-1950  DOA: 12/26/2023 PCP: Maree Isles, MD      Brief Narrative:    Medical records reviewed and are as summarized below:  Mitchell [name] is a 73 y.o. male medical history significant for diabetes mellitus, right ray amputation of fourth patient presented to the ED with complaints of chronic ulcer to his right foot.  He noticed increasing redness and pain of the right foot extending up to the knees.  This has been going on for about a week. He said he had been on a clindamycin  recently for right foot ulcer but there was no improvement in symptoms.   He was initially admitted to [facility] from right foot cellulitis and osteomyelitis.  He was subsequently transferred to [facility] for further management.     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  Analgesics as needed for pain.  Consulted Dr. Loney, ID specialist, to assist with management.  Continue empiric IV antibiotics.  Analgesics as needed for pain.   PVD: Arterial duplex showed 75 to 99% stenosis in the right superficial femoral artery and ABI showed moderate right lower extremity arterial disease.  S/p balloon angioplasty of right AT, drug-coated balloon angioplasty of right popliteal and drug-coated balloon angioplasty of right SFA on 12/30/2023. Vascular surgery recommended outpatient follow-up and has signed off the case. 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 Serene   Type II DM: Continue NovoLog  as needed for hyperglycemia.   Tobacco use disorder: Counseled to quit.   Diet Order              Diet regular Room service appropriate? Yes; Fluid consistency: Thin  Diet effective now                                  Consultants: Vascular surgeon Orthopedic surgeon  Procedures: S/p balloon angioplasty of right AT, drug-coated balloon angioplasty of right popliteal and drug-coated balloon angioplasty of right SFA on 12/30/2023.    Medications:    aspirin  EC  81 mg Oral Daily   clopidogrel   75 mg Oral Q breakfast   enoxaparin  (LOVENOX ) injection  60 mg Subcutaneous QHS   gabapentin   600 mg Oral TID   insulin  aspart  0-5 Units Subcutaneous QHS   insulin  aspart  0-9 Units Subcutaneous TID WC   nicotine   21 mg Transdermal Daily   rosuvastatin   5 mg Oral Daily   sodium chloride  flush  3 mL Intravenous Q12H   Continuous Infusions:  sodium chloride      ceFEPime  (MAXIPIME ) IV 2 g (12/31/23 1019)   vancomycin        Anti-infectives (From admission, onward)    Start     Dose/Rate Route Frequency Ordered Stop   12/31/23 1930  vancomycin  (VANCOCIN ) IVPB 1000 mg/200 mL premix       Placed in Followed by Linked Group   1,000 mg 200 mL/hr over 60 Minutes Intravenous Every 12 hours 12/30/23 1900     12/28/23 0000  vancomycin  (VANCOREADY) IVPB  1250 mg/250 mL  Status:  Discontinued       Placed in Followed by Linked Group   1,250 mg 166.7 mL/hr over 90 Minutes Intravenous Every 12 hours 12/27/23 1102 12/27/23 1110   12/28/23 0000  vancomycin  (VANCOCIN ) IVPB 1000 mg/200 mL premix  Status:  Discontinued       Placed in Followed by Linked Group   1,000 mg 200 mL/hr over 60 Minutes Intravenous Every 12 hours 12/27/23 1110 12/30/23 1900   12/27/23 1200  vancomycin  (VANCOREADY) IVPB 2000 mg/400 mL  Status:  Discontinued       Placed in Followed by Linked Group   2,000 mg 200 mL/hr over 120 Minutes Intravenous  Once 12/27/23 1102 12/27/23 1110   12/27/23 1200  vancomycin  (VANCOREADY) IVPB 2000 mg/400 mL       Placed in Followed by Linked Group   2,000  mg 200 mL/hr over 120 Minutes Intravenous  Once 12/27/23 1110 12/27/23 1415   12/27/23 1130  ceFEPIme  (MAXIPIME ) 2 g in sodium chloride  0.9 % 100 mL IVPB        2 g 200 mL/hr over 30 Minutes Intravenous Every 8 hours 12/27/23 1049     12/26/23 1545  vancomycin  (VANCOCIN ) IVPB 1000 mg/200 mL premix        1,000 mg 200 mL/hr over 60 Minutes Intravenous  Once 12/26/23 1536 12/26/23 1709   12/26/23 1545  cefTRIAXone  (ROCEPHIN ) 2 g in sodium chloride  0.9 % 100 mL IVPB        2 g 200 mL/hr over 30 Minutes Intravenous  Once 12/26/23 1536 12/26/23 1637   12/26/23 1545  metroNIDAZOLE  (FLAGYL ) IVPB 500 mg        500 mg 100 mL/hr over 60 Minutes Intravenous  Once 12/26/23 1536 12/26/23 1706              Family Communication/Anticipated D/C date and plan/Code Status   DVT prophylaxis:      Code Status: Full Code  Family Communication: None Disposition Plan: Plan to discharge home   Status is: Inpatient Remains inpatient appropriate because: Right foot infection       Subjective:   Interval events noted.  No complaints.  He feels better.  He said he is in good spirits.  Objective:    Vitals:   12/31/23 0404 12/31/23 0735 12/31/23 0900 12/31/23 1100  BP: (!) 120/46 (!) 143/53  (!) 113/99  Pulse: (!) 59 67 65 60  Resp: 18 17    Temp: 98.3 F (36.8 C) 98.2 F (36.8 C)    TempSrc: Oral Oral    SpO2: 95% 95% 97% 94%  Weight:      Height:       No data found.  No intake or output data in the 24 hours ending 12/31/23 1203  Filed Weights   12/26/23 1518  Weight: 117.9 kg    Exam:  GEN: NAD SKIN: Warm and dry EYES: No pallor or icterus ENT: MMM CV: RRR PULM: CTA B ABD: soft, ND, NT, +BS CNS: AAO x 3, non focal EXT: Dressing on right foot is clean, dry and intact.  Right leg is erythematous       Data Reviewed:   I have personally reviewed following labs and imaging studies:  Labs: Labs show the following:   Basic Metabolic Panel: Recent Labs   Lab 12/26/23 1550 12/27/23 0445 12/30/23 0440  NA 142 140 136  K 4.5 4.2 4.1  CL 102 103 104  CO2 27 30 25  GLUCOSE 48* 63* 124*  BUN 13 12 18   CREATININE 0.94 0.88 0.95  CALCIUM  9.1 8.5* 8.0*   GFR Estimated Creatinine Clearance: 91.2 mL/min (by C-G formula based on SCr of 0.95 mg/dL). Liver Function Tests: Recent Labs  Lab 12/26/23 1550  AST 21  ALT 16  ALKPHOS 109  BILITOT 0.4  PROT 7.8  ALBUMIN 3.8   No results for input(s): LIPASE, AMYLASE in the last 168 hours. No results for input(s): AMMONIA in the last 168 hours. Coagulation profile No results for input(s): INR, PROTIME in the last 168 hours.  CBC: Recent Labs  Lab 12/26/23 1550 12/27/23 0445 12/31/23 0417  WBC 12.9* 10.9* 8.1  NEUTROABS 10.1*  --   --   HGB 13.9 13.0 12.2*  HCT 45.5 41.6 38.5*  MCV 90.3 89.5 87.1  PLT 305 275 280   Cardiac Enzymes: No results for input(s): CKTOTAL, CKMB, CKMBINDEX, TROPONINI in the last 168 hours. BNP (last 3 results) No results for input(s): PROBNP in the last 8760 hours. CBG: Recent Labs  Lab 12/30/23 1103 12/30/23 1420 12/30/23 1803 12/30/23 2137 12/31/23 0734  GLUCAP 103* 95 114* 221* 115*   D-Dimer: No results for input(s): DDIMER in the last 72 hours. Hgb A1c: No results for input(s): HGBA1C in the last 72 hours.  Lipid Profile: Recent Labs    12/31/23 0417  CHOL 111  HDL 29*  LDLCALC 55  TRIG 862  CHOLHDL 3.8   Thyroid  function studies: No results for input(s): TSH, T4TOTAL, T3FREE, THYROIDAB in the last 72 hours.  Invalid input(s): FREET3 Anemia work up: No results for input(s): VITAMINB12, FOLATE, FERRITIN, TIBC, IRON, RETICCTPCT in the last 72 hours. Sepsis Labs: Recent Labs  Lab 12/26/23 1550 12/27/23 0445 12/31/23 0417  WBC 12.9* 10.9* 8.1  LATICACIDVEN 1.6  --   --     Microbiology Recent Results (from the past 240 hours)  Culture, blood (routine x 2)     Status: None    Collection Time: 12/26/23  3:50 PM   Specimen: BLOOD  Result Value Ref Range Status   Specimen Description BLOOD RIGHT ANTECUBITAL  Final   Special Requests   Final    BOTTLES DRAWN AEROBIC AND ANAEROBIC Blood Culture adequate volume   Culture   Final    NO GROWTH 5 DAYS Performed at [facility], [address] [place], [place] [address]    Report Status 12/31/2023 FINAL  Final  Culture, blood (routine x 2)     Status: None   Collection Time: 12/26/23  3:54 PM   Specimen: BLOOD  Result Value Ref Range Status   Specimen Description BLOOD BLOOD RIGHT FOREARM  Final   Special Requests   Final    BOTTLES DRAWN AEROBIC AND ANAEROBIC Blood Culture adequate volume   Culture   Final    NO GROWTH 5 DAYS Performed at [facility], [address] [place], [place] [address]    Report Status 12/31/2023 FINAL  Final    Procedures and diagnostic studies:  PERIPHERAL VASCULAR CATHETERIZATION Result Date: 12/30/2023 Images from the original result were not included. 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  He is now admitted with a ulcer on his plantar surface as well as on the lateral aspect of the dorsal foot and ankle.  An ABI duplex was obtained which demonstrated decreased perfusion to the right foot with a distal SFA/popliteal  stenosis.  Risks and benefits of angiogram with intervention were reviewed and he elected to proceed. Findings: Patent aorta bilateral renal arteries.  Widely patent iliac systems bilaterally. Right common femoral, SFA and profunda are widely patent.  There is an approximate 60% stenosis in the mid SFA.  There are few lesions throughout the popliteal proximately 60% maximal stenosis.  There is three-vessel runoff to the foot.  There are tandem lesions in the AT and 70% stenosis. Left common femoral and profunda are widely patent.  The SFA has some ratty disease but no flow-limiting stenosis.  The popliteal artery is severely diseased with a maximal 50% stenosis.  There is two-vessel runoff to the foot via the PT and peroneal.  The AT occludes shortly after its takeoff. Procedure:  The patient was identified in the holding area and taken to the cath lab  The patient was then placed supine on the table and prepped and draped in the usual sterile fashion.  A time out was called.  Ultrasound was used to evaluate the left common femoral artery.  It was patent .  A digital ultrasound image was acquired.  A micropuncture needle was used to access the left common femoral artery under ultrasound guidance.  An 018 wire was advanced without resistance and a micropuncture sheath was placed.  The 018 wire was removed and a benson wire was placed.  The micropuncture sheath was exchanged for a 5 french sheath.  An omniflush catheter was advanced over the wire to the level of L-1.  An abdominal angiogram was obtained.  Next, using the omniflush catheter and a glide advantage wire, the aortic bifurcation was crossed and the catheter was placed into theright external iliac artery and right runoff was obtained. This demonstrated the above findings.  The glide advantage wire was placed through the catheter and into the SFA.  The short 5 French sheath was then exchanged for a 6 French by 45 cm catapult sheath and the patient was  systemically heparinized.  Using a V18 wire and a CXI catheter the lesions were crossed and the wire was placed into the distal AT.  The AT lesions were treated with a 3 mm x 100 mm Sterling balloon.  The popliteal lesions were treated with a 6 mm x 80 mm Ranger and the SFA lesion with a 6 mm x 40 mm Ranger.  A completion right leg angiogram demonstrated an excellent response in the treated segments with three-vessel runoff.  The V18 wire was removed and a Bentson wire was replaced through the long sheath and this was exchanged for a short 6 French sheath.  right runoff was performed via retrograde sheath injections which demonstrated the above findings.  While attempting to place a Mynx closure device the short 6 French sheath kinked or unable to replace a wire or the closure device through this.  The sheath was then removed and manual pressure was held for approximately 20 minutes. Contrast: 70 cc Sedation: 78  minutes Impression: Right leg with inline flow to the right foot with three-vessel runoff.  Treatment of AT, popliteal and SFA lesions. The left leg with inline flow through the PT and peroneal without flow-limiting stenosis. Mitchell GORMAN Serve MD [facility] Office: [phone]  VAS US  LOWER EXTREMITY ARTERIAL DUPLEX Result Date: 12/29/2023 LOWER EXTREMITY ARTERIAL DUPLEX STUDY Patient Name:  Mitchell [name]  Date of Exam:   12/29/2023 Medical Rec #: [id number]   Accession #:    [id number] Date of Birth: 1950-06-26   Patient Gender: M Patient Age:   44 years Exam Location:  [facility] Procedure:      VAS US  LOWER EXTREMITY ARTERIAL DUPLEX Referring Phys: GAILE NEW --------------------------------------------------------------------------------  Indications: Right transmetatarsal amputation, 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 High Risk Factors: Hypertension, Diabetes, current smoker.  Chronic ulceration with new cellulitis and probable  osteomyelitis.  Vascular Interventions: Aortogram  07/22/15 indicating widely patent aortoiliac,                         SFA, pop, and tibial arteries bilaterally. Current ABI:            R: 0.77 L:1.03/0.46 (TBI) Comparison Study: No prior study on file Performing Technologist: Alberta Lis RVS  Examination Guidelines: A complete evaluation includes B-mode imaging, spectral Doppler, color Doppler, and power Doppler as needed of all accessible portions of each vessel. Bilateral testing is considered an integral part of a complete examination. Limited examinations for reoccurring indications may be performed as noted.  +----------+--------+-----+---------------+----------+-------------------------+ RIGHT     PSV cm/sRatioStenosis       Waveform  Comments                  +----------+--------+-----+---------------+----------+-------------------------+ CFA Prox  181                         biphasic                            +----------+--------+-----+---------------+----------+-------------------------+ CFA Distal111                         monophasic                          +----------+--------+-----+---------------+----------+-------------------------+ DFA       109                         monophasic                          +----------+--------+-----+---------------+----------+-------------------------+ SFA Prox  123                         monophasic                          +----------+--------+-----+---------------+----------+-------------------------+ SFA Mid   451          75-99% stenosismonophasic                          +----------+--------+-----+---------------+----------+-------------------------+ SFA Distal204          50-74% stenosismonophasic                          +----------+--------+-----+---------------+----------+-------------------------+  POP Prox  192          30-49% stenosismonophasic                           +----------+--------+-----+---------------+----------+-------------------------+ POP Mid   188          30-49% stenosismonophasic                          +----------+--------+-----+---------------+----------+-------------------------+ POP Distal174          30-49% stenosismonophasic                          +----------+--------+-----+---------------+----------+-------------------------+ TP Trunk  124                         monophasic                          +----------+--------+-----+---------------+----------+-------------------------+ ATA Prox  55                          monophasic                          +----------+--------+-----+---------------+----------+-------------------------+ ATA Mid   103                         monophasiccollaterals               +----------+--------+-----+---------------+----------+-------------------------+ ATA Distal37                          monophasic                          +----------+--------+-----+---------------+----------+-------------------------+ PTA Prox  127                         monophasic                          +----------+--------+-----+---------------+----------+-------------------------+ PTA Mid   59                          monophasiccollaterals               +----------+--------+-----+---------------+----------+-------------------------+ PTA Distal77                          monophasic                          +----------+--------+-----+---------------+----------+-------------------------+ PERO Prox                                       not visualized secondary                                                  to edema                  +----------+--------+-----+---------------+----------+-------------------------+  +-----------+--------+-----+---------------+----------+-----------+  LEFT       PSV cm/sRatioStenosis       Waveform  Comments     +-----------+--------+-----+---------------+----------+-----------+ CFA Prox   123                         biphasic              +-----------+--------+-----+---------------+----------+-----------+ CFA Distal 144                         biphasic              +-----------+--------+-----+---------------+----------+-----------+ DFA        128                         biphasic              +-----------+--------+-----+---------------+----------+-----------+ SFA Prox   166          30-49% stenosisbiphasic              +-----------+--------+-----+---------------+----------+-----------+ SFA Mid    153          30-49% stenosisbiphasic              +-----------+--------+-----+---------------+----------+-----------+ SFA Distal 164          30-49% stenosisbiphasic              +-----------+--------+-----+---------------+----------+-----------+ POP Prox   273          50-74% stenosisbiphasic              +-----------+--------+-----+---------------+----------+-----------+ POP Mid    211                         biphasic              +-----------+--------+-----+---------------+----------+-----------+ POP Distal 115                         biphasic              +-----------+--------+-----+---------------+----------+-----------+ TP Trunk   87                          biphasic              +-----------+--------+-----+---------------+----------+-----------+ ATA Prox   62                          biphasic              +-----------+--------+-----+---------------+----------+-----------+ ATA Mid    22                          monophasic            +-----------+--------+-----+---------------+----------+-----------+ ATA Distal 20                          monophasiccollaterals +-----------+--------+-----+---------------+----------+-----------+ PTA Prox   77                          biphasic               +-----------+--------+-----+---------------+----------+-----------+ PTA Mid    93  biphasic              +-----------+--------+-----+---------------+----------+-----------+ PTA Distal 91                          biphasic              +-----------+--------+-----+---------------+----------+-----------+ PERO Prox  57                          biphasic              +-----------+--------+-----+---------------+----------+-----------+ PERO Mid   63                          biphasic              +-----------+--------+-----+---------------+----------+-----------+ PERO Distal86                          biphasic              +-----------+--------+-----+---------------+----------+-----------+  Summary: Right: 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 50-74% stenosis noted in the distal superficial femoral artery/proximal popliteal artery. 30-49% stenosis noted in the popliteal artery. Diffuse calcific plaque noted throughout the right lower extremity Left: 30-49% stenosis noted in the superficial femoral artery. 50-74% stenosis noted in the popliteal artery. Diffuse calcific plaque noted throughout the left lower extremity.  See table(s) above for measurements and observations. Electronically signed by Debby Robertson on 12/29/2023 at 8:15:25 PM.    Final    VAS US  ABI WITH/WO TBI Result Date: 12/29/2023  LOWER EXTREMITY DOPPLER STUDY Patient Name:  Naquan Garman  Date of Exam:   12/29/2023 Medical Rec #: [id number]   Accession #:    [id number] Date of Birth: 02/24/1950   Patient Gender: M Patient Age:   88 years Exam Location:  [facility] Procedure:      VAS US  ABI WITH/WO TBI Referring Phys: GAILE NEW --------------------------------------------------------------------------------  Indications: Chronic ulceration with new cellulitis and probable osteomyelitis High Risk Factors: Hypertension, Diabetes, current smoker. Other Factors: Medication  noncompliance secondary to cost.  Vascular Interventions: Right transmetatarsal amputation 10/28/20. Aortogram                         07/22/15 indicating widely patent aortoiliac, SFA,                         popliteal, and tibial arteries bilaterally. Limitations: Today's exam was limited due to bandages, Edema, pain with cuff              pressure, right calf. Comparison Study: No prior ABI on file Performing Technologist: Rachel Pellet RVS  Examination Guidelines: A complete evaluation includes at minimum, Doppler waveform signals and systolic blood pressure reading at the level of bilateral brachial, anterior tibial, and posterior tibial arteries, when vessel segments are accessible. Bilateral testing is considered an integral part of a complete examination. Photoelectric Plethysmograph (PPG) waveforms and toe systolic pressure readings are included as required and additional duplex testing as needed. Limited examinations for reoccurring indications may be performed as noted.  ABI Findings: +---------+------------------+-----+---------+----------+ Right    Rt Pressure (mmHg)IndexWaveform Comment    +---------+------------------+-----+---------+----------+ Brachial 148                    triphasic           +---------+------------------+-----+---------+----------+  PTA      125               0.77 biphasic            +---------+------------------+-----+---------+----------+ DP       106               0.65 biphasic            +---------+------------------+-----+---------+----------+ Great Toe                                amputation +---------+------------------+-----+---------+----------+ +---------+------------------+-----+---------+-------+ Left     Lt Pressure (mmHg)IndexWaveform Comment +---------+------------------+-----+---------+-------+ Brachial 162                    triphasic        +---------+------------------+-----+---------+-------+ PTA      160                0.99 biphasic         +---------+------------------+-----+---------+-------+ DP       167               1.03 biphasic         +---------+------------------+-----+---------+-------+ Great Toe75                0.46                  +---------+------------------+-----+---------+-------+ +-------+-----------+-----------+------------+------------+ ABI/TBIToday's ABIToday's TBIPrevious ABIPrevious TBI +-------+-----------+-----------+------------+------------+ Right  0.77       amputation                          +-------+-----------+-----------+------------+------------+ Left   1.03       0.46                                +-------+-----------+-----------+------------+------------+  Summary: Right: Resting right ankle-brachial index indicates moderate right lower extremity arterial disease.  Left: Resting left ankle-brachial index is within normal range. Left toe pressure is >60 mmHg which suggests adequate perfusion for healing. *See table(s) above for measurements and observations.  Electronically signed by Debby Robertson on 12/29/2023 at 8:14:43 PM.    Final                LOS: 5 days   Jameshia Hayashida  [facility]   Pager on [email or web address]. If 7PM-7AM, please contact night-coverage at [email or web address]     12/31/2023, 12:03 PM

## 2024-01-01 ENCOUNTER — Other Ambulatory Visit (HOSPITAL_COMMUNITY): Payer: Self-pay

## 2024-01-01 DIAGNOSIS — E11621 Type 2 diabetes mellitus with foot ulcer: Secondary | ICD-10-CM | POA: Diagnosis not present

## 2024-01-01 DIAGNOSIS — M86171 Other acute osteomyelitis, right ankle and foot: Secondary | ICD-10-CM | POA: Diagnosis not present

## 2024-01-01 DIAGNOSIS — L97414 Non-pressure chronic ulcer of right heel and midfoot with necrosis of bone: Secondary | ICD-10-CM | POA: Diagnosis not present

## 2024-01-01 DIAGNOSIS — L97919 Non-pressure chronic ulcer of unspecified part of right lower leg with unspecified severity: Secondary | ICD-10-CM

## 2024-01-01 DIAGNOSIS — Z89421 Acquired absence of other right toe(s): Secondary | ICD-10-CM | POA: Diagnosis not present

## 2024-01-01 DIAGNOSIS — Z794 Long term (current) use of insulin: Secondary | ICD-10-CM | POA: Diagnosis not present

## 2024-01-01 LAB — GLUCOSE, CAPILLARY
Glucose-Capillary: 120 mg/dL — ABNORMAL HIGH (ref 70–99)
Glucose-Capillary: 294 mg/dL — ABNORMAL HIGH (ref 70–99)
Glucose-Capillary: 189 mg/dL — ABNORMAL HIGH (ref 70–99)
Glucose-Capillary: 177 mg/dL — ABNORMAL HIGH (ref 70–99)

## 2024-01-01 LAB — CK: Total CK: 39 U/L — ABNORMAL LOW (ref 49–397)

## 2024-01-01 MED ORDER — ROSUVASTATIN CALCIUM 20 MG PO TABS
20.0000 mg | ORAL_TABLET | Freq: Every day | ORAL | Status: DC
  Administered 2024-01-02: 09:00:00 20 mg via ORAL
  Filled 2024-01-01: qty 1

## 2024-01-01 MED ORDER — SODIUM CHLORIDE 0.9 % IV SOLN
3.0000 g | Freq: Four times a day (QID) | INTRAVENOUS | Status: DC
  Administered 2024-01-01 – 2024-01-02 (×4): 3 g via INTRAVENOUS
  Filled 2024-01-01 (×4): qty 8

## 2024-01-01 MED ORDER — INSULIN GLARGINE 100 UNIT/ML ~~LOC~~ SOLN
14.0000 [IU] | Freq: Every day | SUBCUTANEOUS | Status: DC
  Administered 2024-01-01: 22:00:00 14 [IU] via SUBCUTANEOUS
  Filled 2024-01-01 (×2): qty 0.14

## 2024-01-01 MED ORDER — INSULIN GLARGINE 100 UNIT/ML ~~LOC~~ SOLN: 10.0000 [IU] | Freq: Two times a day (BID) | SUBCUTANEOUS | Status: DC

## 2024-01-01 MED ORDER — DAPTOMYCIN-SODIUM CHLORIDE 700-0.9 MG/100ML-% IV SOLN
8.0000 mg/kg | Freq: Every day | INTRAVENOUS | Status: DC
  Administered 2024-01-01: 17:00:00 700 mg via INTRAVENOUS
  Filled 2024-01-01 (×2): qty 100

## 2024-01-01 MED ADMIN — Gabapentin Cap 300 MG: 600 mg | ORAL | @ 10:00:00 | NDC 16714066202

## 2024-01-01 MED ADMIN — Aspirin Tab Delayed Release 81 MG: 81 mg | ORAL | @ 10:00:00 | NDC 69618006610

## 2024-01-01 MED ADMIN — Gabapentin Cap 300 MG: 600 mg | ORAL | @ 22:00:00 | NDC 16714066202

## 2024-01-01 MED ADMIN — Gabapentin Cap 300 MG: 600 mg | ORAL | @ 17:00:00 | NDC 16714066202

## 2024-01-01 NOTE — Progress Notes (Incomplete)
 PCN allergy

## 2024-01-01 NOTE — Progress Notes (Addendum)
 Mitchell [name]  FMW:[id number] DOB: 1950-10-13 DOA: 12/26/2023 PCP: Maree Isles, MD    Brief Narrative:  73 year old with a history of DM2 and right fourth ray amputation who presented to the ER with complaints of chronic ulceration of his right foot with increasing redness and pain of the foot extending all the way up to his knee for approximately 1 week.  He had utilized clindamycin  in the outpatient setting with no improvement.  He was initially admitted to [facility] and then transferred to [facility] for V which is ascular Surgery evaluation.  Goals of Care:   Code Status: Full Code   DVT prophylaxis: Lovenox    Interim Hx: In a good mood.  No new complaints.  Denies chest pain shortness of breath fevers or chills.  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 Harden with plan presently to follow the patient status post revascularization -plan will be to discharge with Vashe dressing changes daily and minimizing weightbearing (will need HH)   Peripheral vascular disease Arterial duplex noted 75-99% stenosis in the right superficial femoral artery and ABI revealed moderate right lower extremity arterial disease -status post balloon angioplasty of the right AT with drug-coated balloon angioplasty of the right popliteal and drug-coated balloon angioplasty of the right SFA 12/30/2023 per vascular surgery -to follow-up with vascular surgery as an outpatient - continue aspirin  Plavix  and statin  DM2 CBG somewhat variable but overall reasonably well-controlled -f resume long acting insulin  and follow  Tobacco abuse Has been advised of the absolute need to discontinue smoking entirely    Family Communication: No family present at time of exam Disposition: Plan for discharge home tomorrow once final ID recommendations for  antibiotic coverage obtained and home health arranged for wound care at home   Objective: Blood pressure (!) 147/60, pulse 63, temperature 98.5 F (36.9 C), temperature source Oral, resp. rate 18, height 5' 11.5 (1.816 m), weight 117.9 kg, SpO2 98%.  Intake/Output Summary (Last 24 hours) at 01/01/2024 1222 Last data filed at 01/01/2024 1100 Gross per 24 hour  Intake 720 ml  Output 825 ml  Net -105 ml   Filed Weights   12/26/23 1518  Weight: 117.9 kg    Examination: General: No acute respiratory distress Lungs: Clear to auscultation bilaterally without wheezes or crackles Cardiovascular: Regular rate and rhythm without murmur gallop or rub normal S1 and S2 Abdomen: Nontender, nondistended, soft, bowel sounds positive, no rebound, no ascites, no appreciable mass Extremities: No significant cyanosis, clubbing, or edema bilateral lower extremities  CBC: Recent Labs  Lab 12/26/23 1550 12/27/23 0445 12/31/23 0417  WBC 12.9* 10.9* 8.1  NEUTROABS 10.1*  --   --   HGB 13.9 13.0 12.2*  HCT 45.5 41.6 38.5*  MCV 90.3 89.5 87.1  PLT 305 275 280   Basic Metabolic Panel: Recent Labs  Lab 12/26/23 1550 12/27/23 0445 12/30/23 0440  NA 142 140 136  K 4.5 4.2 4.1  CL 102 103 104  CO2 27 30 25   GLUCOSE 48* 63* 124*  BUN 13 12 18   CREATININE 0.94 0.88 0.95  CALCIUM  9.1 8.5* 8.0*   GFR: Estimated Creatinine Clearance: 91.2 mL/min (by C-G formula based on SCr of 0.95 mg/dL).   Scheduled Meds:  aspirin  EC  81 mg Oral Daily   clopidogrel   75 mg Oral Q breakfast   enoxaparin  (LOVENOX ) injection  60 mg Subcutaneous QHS   gabapentin   600 mg Oral TID   insulin  aspart  0-5 Units Subcutaneous QHS   insulin  aspart  0-9 Units Subcutaneous TID WC   nicotine   21 mg Transdermal Daily   [START ON 01/02/2024] rosuvastatin   20 mg Oral Daily   sodium chloride  flush  3 mL Intravenous Q12H   Continuous Infusions:  ampicillin -sulbactam (UNASYN ) IV 3 g (01/01/24 1209)   DAPTOmycin         LOS: 6 days   Reyes IVAR Moores, MD [facility] Office  [phone] Pager - Text Page per Tracey  If 7PM-7AM, please contact night-coverage per Amion 01/01/2024, 12:22 PM

## 2024-01-01 NOTE — Progress Notes (Signed)
 PHARMACIST LIPID MONITORING   Mitchell [name] is a 73 y.o. male admitted on 12/26/2023 with PVD.  Pharmacy has been consulted to optimize lipid-lowering therapy with the indication of secondary prevention for clinical ASCVD.  Recent Labs:  Lipid Panel (last 6 months):   Lab Results  Component Value Date   CHOL 111 12/31/2023   TRIG 137 12/31/2023   HDL 29 (L) 12/31/2023   CHOLHDL 3.8 12/31/2023   VLDL 27 12/31/2023   LDLCALC 55 12/31/2023    Hepatic function panel (last 6 months):   Lab Results  Component Value Date   AST 21 12/26/2023   ALT 16 12/26/2023   ALKPHOS 109 12/26/2023   BILITOT 0.4 12/26/2023    SCr (since admission):   Serum creatinine: 0.95 mg/dL 87/84/74 9559 Estimated creatinine clearance: 91.2 mL/min  Current therapy and lipid therapy tolerance Current lipid-lowering therapy: Crestor  5mg /day Documented or reported allergies or intolerances to lipid-lowering therapies (if applicable): None   Plan:    1.Statin intensity (high intensity recommended for all patients regardless of the LDL):  Add or increase statin to high intensity.  2.Add ezetimibe (if any one of the following):   Not indicated at this time.  3.Refer to lipid clinic:   No  4.Follow-up with:  Primary care provider - Maree Isles, MD  5.Follow-up labs after discharge:  Changes in lipid therapy were made. Check a lipid panel in 8-12 weeks then annually.     Prentice Poisson, PharmD Clinical Pharmacist **Pharmacist phone directory can now be found on [email or web address] ([email or web address]).  Listed under [facility].

## 2024-01-01 NOTE — Consult Note (Cosign Needed Addendum)
 [facility]    Date of Admission:  12/26/2023     Total days of antibiotics 7               Reason for Consult: Osteomyelitis   Referring Provider: Dr. 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 S/p aortogram and angioplasty. Orthopedics recommending below knee amputation as there are limited foot salvage options.  Blood cultures from 12/26/23 are without growth to date with no other available culture data. Discussed plan of care to continue with antibiotics while awaiting surgical intervention and will change to Daptomycin  and ampicillin -sulbactam. Initiate therapeutic drug monitoring of CK levels and for eosinophilic pneumonia. Continue post-intervention care per Vascular Surgery. Encouraged tobacco cessation as it will continue to increase risk for further complicated healing and infection. Standard/universal precautions. Diabetes and remaining medical and supportive care per Internal Medicine.   PLAN:  Change antibiotics to daptomycin  and ampicillin -sulbactam.  Therapeutic drug monitoring of CK levels and for eosinophilic pneumonia. Post-intervention wound care per Vascular Surgery Await surgical interventions per Orthopedics.  Standard/universal precautions.  Encourage tobacco cessation and continued blood sugar management Remaining medical and supportive care per Internal Medicine.   Addendum: Orthopedics plans to hold on surgical intervention. Recommend changing antibiotics to Augmentin  and Doxycycline  for 6 weeks.   Principal Problem:   Diabetic ulcer of right foot (HCC) Active Problems:   Type 2 diabetes mellitus with foot ulcer (HCC)   Peripheral neuropathy   Tobacco abuse   PAD (peripheral  artery disease)    aspirin  EC  81 mg Oral Daily   clopidogrel   75 mg Oral Q breakfast   enoxaparin  (LOVENOX ) injection  60 mg Subcutaneous QHS   gabapentin   600 mg Oral TID   insulin  aspart  0-5 Units Subcutaneous QHS   insulin  aspart  0-9 Units Subcutaneous TID WC   nicotine   21 mg Transdermal Daily   [START ON 01/02/2024] rosuvastatin   20 mg Oral Daily   sodium chloride  flush  3 mL Intravenous Q12H     HPI: Mitchell [name] is a 73 y.o. male with previous medical history of Type 2 diabetes complicated by diabetic foot ulcer, iron deficiency anemia, tobacco use and s/p right fourth ray amputation presenting to the hospital with worsening redness and pain of his chronic right foot ulcer.   Mitchell [name] has a chronic right foot ulcer that is followed by primary care and wound care and presents to the hospital with acute worsening over the past week prior to presentation with redness and pain. Initially afebrile with WBC count of 10,900. Right foot x-ray with soft tissue swelling of stump and small erosions at the cuboid and medial/middle cuneiform. MRI right foot with concern for early osteomyelitis at the distal cuboid with adjacent plantar ulceration with subcutaneous gas. Seen by Vascular Surgery and s/p aortogram and angioplasty. Orthopedics with no further foot salvage intervention options and recommendation for below knee amputation.   Mitchell [name] has remained afebrile and without leukocytosis. Started on broad spectrum coverage with vancomycin , ceftriaxone , and metronidazole  and currently narrowed to vancomycin  and cefepime . Tolerating antibiotics with no adverse side effects. Most recent creatinine 0.95 with stable renal function. Feeling better since arriving to the hospital. Most recent A1c 5.4%. Continues to smoke  tobacco daily.    Review of Systems: Review of Systems  Constitutional:  Negative for chills, fever and weight loss.  Respiratory:  Negative for cough, shortness of breath and  wheezing.   Cardiovascular:  Negative for chest pain and leg swelling.  Gastrointestinal:  Negative for abdominal pain, constipation, diarrhea, nausea and vomiting.  Skin:  Negative for rash.     Past Medical History:  Diagnosis Date   Arthritis    Asthma    Cellulitis of right lower extremity 05/09/2014   Diabetes mellitus without complication (HCC)    Diabetic foot ulcer (HCC) 07/07/2014   Status post bedside debridement of multiple right plantar ulcers by podiatrist, Dr. Macie.   Hypercholesterolemia    IDA (iron deficiency anemia)    Maggot infestation    right foot ulcer   Obesity 05/09/2014   Pneumonia    Tobacco abuse 07/07/2014    Social History[1]  History reviewed. No pertinent family history.  Allergies[2]  OBJECTIVE: Blood pressure (!) 147/60, pulse 63, temperature 98.5 F (36.9 C), temperature source Oral, resp. rate 18, height 5' 11.5 (1.816 m), weight 117.9 kg, SpO2 98%.  Physical Exam Constitutional:      General: He is not in acute distress.    Appearance: He is well-developed.  Cardiovascular:     Rate and Rhythm: Normal rate and regular rhythm.     Heart sounds: Normal heart sounds.  Pulmonary:     Effort: Pulmonary effort is normal.     Breath sounds: Normal breath sounds.  Skin:    General: Skin is warm and dry.  Neurological:     Mental Status: He is alert and oriented to person, place, and time.     Lab Results Lab Results  Component Value Date   WBC 8.1 12/31/2023   HGB 12.2 (L) 12/31/2023   HCT 38.5 (L) 12/31/2023   MCV 87.1 12/31/2023   PLT 280 12/31/2023    Lab Results  Component Value Date   CREATININE 0.95 12/30/2023   BUN 18 12/30/2023   NA 136 12/30/2023   K 4.1 12/30/2023   CL 104 12/30/2023   CO2 25 12/30/2023    Lab Results  Component Value Date   ALT 16 12/26/2023   AST 21 12/26/2023   ALKPHOS 109 12/26/2023   BILITOT 0.4 12/26/2023     Microbiology: Recent Results (from the past 240 hours)  Culture, blood  (routine x 2)     Status: None   Collection Time: 12/26/23  3:50 PM   Specimen: BLOOD  Result Value Ref Range Status   Specimen Description BLOOD RIGHT ANTECUBITAL  Final   Special Requests   Final    BOTTLES DRAWN AEROBIC AND ANAEROBIC Blood Culture adequate volume   Culture   Final    NO GROWTH 5 DAYS Performed at [facility], [address] [place], [place] [address]    Report Status 12/31/2023 FINAL  Final  Culture, blood (routine x 2)     Status: None   Collection Time: 12/26/23  3:54 PM   Specimen: BLOOD  Result Value Ref Range Status   Specimen Description BLOOD BLOOD RIGHT FOREARM  Final   Special Requests   Final    BOTTLES DRAWN AEROBIC AND ANAEROBIC Blood Culture adequate volume   Culture   Final    NO GROWTH 5 DAYS Performed at [facility], [address] [place], [place] [address]    Report Status 12/31/2023 FINAL  Final     Cathlyn July, NP  [facility]  01/01/2024  12:51 PM     [1]  Social History Tobacco Use   Smoking status: Every Day    Current packs/day: 0.75    Types: Cigarettes   Smokeless tobacco: Never  Vaping Use   Vaping status: Never Used  Substance Use Topics   Alcohol  use: Yes    Alcohol /week: 0.0 standard drinks of alcohol     Comment: occ-twice a week beer approx 2   Drug use: No  [2]  Allergies Allergen Reactions   Penicillins Itching, Dermatitis and Rash    Immediate rash, facial/tongue/throat swelling, SOB or lightheadedness with hypotension  Has tolerate multiple cephalosporins in the past

## 2024-01-01 NOTE — Plan of Care (Signed)

## 2024-01-01 NOTE — Evaluation (Signed)
 Physical Therapy Evaluation Patient Details Name: Mitchell [name] MRN: [id number] DOB: [birth year] Today's Date: 01/01/2024  History of Present Illness  Pt is a 73 y.o. 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 Pt admitted with cellulites and osteomyelitis. Pt is s/p R AT balloon angioplasty of r popliteal arery and R SFA on 12/30/23. PMH is significant for R transmetatarsal amputation, DM, asthma, and tobacco use.   Clinical Impression  Prior to admittance, pt was mod I with mobility utilizing SPC and was indep with ADLs. Pt presents to evaluation with deficits in mobility, strength, power, activity tolerance, and balance. Pt was able to ambulate short household distances with AD and no physical assistance. Pt encouraged to continue to use walker at home for improved stability and to offload RLE. PT will continue to treat pt while he is admitted. Anticipate no follow up therapies needed at discharge.         If plan is discharge home, recommend the following: Assist for transportation   Can travel by private vehicle        Equipment Recommendations None recommended by PT  Recommendations for Other Services       Functional Status Assessment Patient has had a recent decline in their functional status and demonstrates the ability to make significant improvements in function in a reasonable and predictable amount of time.     Precautions / Restrictions Precautions Precautions: Fall Recall of Precautions/Restrictions: Intact Restrictions Weight Bearing Restrictions Per Provider Order: Yes RLE Weight Bearing Per Provider Order: Weight bearing as tolerated      Mobility  Bed Mobility Overal bed mobility: Needs Assistance Bed Mobility: Supine to Sit, Rolling Rolling: Supervision   Supine to sit: Contact guard     General bed mobility comments: Pt rolled L and R for peri care with  supervision. Pt then completed supine to sit without physical assistance with HOB flat. Increased time to complete.    Transfers Overall transfer level: Needs assistance Equipment used: Rolling walker (2 wheels) Transfers: Sit to/from Stand Sit to Stand: Supervision           General transfer comment: VC given for UE placement. Increased time to complete.    Ambulation/Gait Ambulation/Gait assistance: Supervision Gait Distance (Feet): 80 Feet Assistive device: Rolling walker (2 wheels) Gait Pattern/deviations: Step-through pattern, Decreased stride length, Decreased stance time - right, Decreased step length - right, Decreased weight shift to right, Trunk flexed Gait velocity: reduced Gait velocity interpretation: <1.8 ft/sec, indicate of risk for recurrent falls   General Gait Details: Pt ambulates with reciprocal gait pattern and increased reliance on RW for improved stability and offloading RLE. No losses of balance noted.  Stairs            Wheelchair Mobility     Tilt Bed    Modified Rankin (Stroke Patients Only)       Balance Overall balance assessment: Needs assistance Sitting-balance support: No upper extremity supported, Feet supported Sitting balance-Leahy Scale: Good Sitting balance - Comments: seated EOB for MMT   Standing balance support: During functional activity, No upper extremity supported Standing balance-Leahy Scale: Good Standing balance comment: pt able to stand for peri-care and toileting needs without UE support and no LOB noted                             Pertinent Vitals/Pain Pain  Assessment Pain Assessment: No/denies pain    Home Living Family/patient expects to be discharged to:: Private residence Living Arrangements: Alone Available Help at Discharge: Family;Available PRN/intermittently (son lives in [place] and would be able to check-in if needed per pt report) Type of Home: House Home Access: Ramped entrance        Home Layout: One level Home Equipment: Agricultural Consultant (2 wheels);Cane - single point;Wheelchair - manual;Grab bars - tub/shower;Tub bench      Prior Function Prior Level of Function : Independent/Modified Independent             Mobility Comments: mod I with SPC ADLs Comments: indep     Extremity/Trunk Assessment   Upper Extremity Assessment Upper Extremity Assessment: Generalized weakness    Lower Extremity Assessment Lower Extremity Assessment: Generalized weakness (grossly 4-/5 for all. per pt report neuropathy in RLE.)    Cervical / Trunk Assessment Cervical / Trunk Assessment: Kyphotic  Communication   Communication Communication: No apparent difficulties    Cognition Arousal: Alert Behavior During Therapy: WFL for tasks assessed/performed   PT - Cognitive impairments: No apparent impairments                         Following commands: Intact       Cueing Cueing Techniques: Verbal cues, Visual cues     General Comments General comments (skin integrity, edema, etc.): pt's wound with possible serosanguineous drainage following bout of ambulation; RN notified    Exercises     Assessment/Plan    PT Assessment Patient needs continued PT services  PT Problem List Decreased strength;Decreased range of motion;Decreased activity tolerance;Decreased balance;Decreased mobility;Decreased coordination;Decreased knowledge of use of DME;Decreased safety awareness;Impaired sensation       PT Treatment Interventions DME instruction;Gait training;Functional mobility training;Therapeutic activities;Therapeutic exercise;Balance training;Patient/family education;Wheelchair mobility training;Manual techniques    PT Goals (Current goals can be found in the Care Plan section)  Acute Rehab PT Goals Patient Stated Goal: to go home PT Goal Formulation: With patient Time For Goal Achievement: 01/15/24 Potential to Achieve Goals: Good    Frequency Min  1X/week     Co-evaluation               AM-PAC PT 6 Clicks Mobility  Outcome Measure Help needed turning from your back to your side while in a flat bed without using bedrails?: A Little Help needed moving from lying on your back to sitting on the side of a flat bed without using bedrails?: A Little Help needed moving to and from a bed to a chair (including a wheelchair)?: A Little Help needed standing up from a chair using your arms (e.g., wheelchair or bedside chair)?: A Little Help needed to walk in hospital room?: A Little Help needed climbing 3-5 steps with a railing? : Total 6 Click Score: 16    End of Session Equipment Utilized During Treatment: Gait belt Activity Tolerance: Patient tolerated treatment well Patient left: in chair;with call bell/phone within reach Nurse Communication: Mobility status;Weight bearing status;Other (comment) (wound dressing with drainage) PT Visit Diagnosis: Other abnormalities of gait and mobility (R26.89);Muscle weakness (generalized) (M62.81)    Time: 8561-8476 PT Time Calculation (min) (ACUTE ONLY): 45 min   Charges:   PT Evaluation $PT Eval Moderate Complexity: 1 Mod PT Treatments $Therapeutic Activity: 8-22 mins PT General Charges $$ ACUTE PT VISIT: 1 Visit         Leontine Hilt DPT Acute Rehab Services [phone] Prefer contact via chat  Tycho Cheramie B Shadana Pry 01/01/2024, 3:44 PM

## 2024-01-02 ENCOUNTER — Ambulatory Visit (HOSPITAL_COMMUNITY): Admitting: Physical Therapy

## 2024-01-02 ENCOUNTER — Other Ambulatory Visit (HOSPITAL_COMMUNITY): Payer: Self-pay

## 2024-01-02 DIAGNOSIS — E11621 Type 2 diabetes mellitus with foot ulcer: Secondary | ICD-10-CM | POA: Diagnosis not present

## 2024-01-02 DIAGNOSIS — L97414 Non-pressure chronic ulcer of right heel and midfoot with necrosis of bone: Secondary | ICD-10-CM | POA: Diagnosis not present

## 2024-01-02 LAB — CBC
HCT: 38.1 % — ABNORMAL LOW (ref 39.0–52.0)
Hemoglobin: 12.1 g/dL — ABNORMAL LOW (ref 13.0–17.0)
MCH: 27.7 pg (ref 26.0–34.0)
MCHC: 31.8 g/dL (ref 30.0–36.0)
MCV: 87.2 fL (ref 80.0–100.0)
Platelets: 249 K/uL (ref 150–400)
RBC: 4.37 MIL/uL (ref 4.22–5.81)
RDW: 14.5 % (ref 11.5–15.5)
WBC: 6.6 K/uL (ref 4.0–10.5)
nRBC: 0 % (ref 0.0–0.2)

## 2024-01-02 LAB — BASIC METABOLIC PANEL WITH GFR
Anion gap: 7 (ref 5–15)
BUN: 17 mg/dL (ref 8–23)
CO2: 24 mmol/L (ref 22–32)
Calcium: 8.5 mg/dL — ABNORMAL LOW (ref 8.9–10.3)
Chloride: 107 mmol/L (ref 98–111)
Creatinine, Ser: 0.87 mg/dL (ref 0.61–1.24)
GFR, Estimated: >60 mL/min (ref >60)
Glucose, Bld: 127 mg/dL — ABNORMAL HIGH (ref 70–99)
Potassium: 4.2 mmol/L (ref 3.5–5.1)
Sodium: 138 mmol/L (ref 135–145)

## 2024-01-02 LAB — GLUCOSE, CAPILLARY
Glucose-Capillary: 128 mg/dL — ABNORMAL HIGH (ref 70–99)
Glucose-Capillary: 184 mg/dL — ABNORMAL HIGH (ref 70–99)

## 2024-01-02 MED ORDER — ACETAMINOPHEN 325 MG PO TABS: 650.0000 mg | ORAL_TABLET | ORAL | Status: AC | PRN

## 2024-01-02 MED ORDER — AMOXICILLIN-POT CLAVULANATE 875-125 MG PO TABS
1.0000 | ORAL_TABLET | Freq: Two times a day (BID) | ORAL | Status: DC
  Administered 2024-01-02: 12:00:00 1 via ORAL
  Filled 2024-01-02: qty 1

## 2024-01-02 MED ORDER — ASPIRIN EC 81 MG PO TBEC
81.0000 mg | DELAYED_RELEASE_TABLET | Freq: Every day | ORAL | 0 refills | Status: AC
  Filled 2024-01-02: qty 360, 360d supply, fill #0

## 2024-01-02 MED ORDER — ROSUVASTATIN CALCIUM 20 MG PO TABS
20.0000 mg | ORAL_TABLET | Freq: Every day | ORAL | 0 refills | Status: AC
  Filled 2024-01-02: qty 90, 90d supply, fill #0

## 2024-01-02 MED ORDER — AMOXICILLIN-POT CLAVULANATE 875-125 MG PO TABS
1.0000 | ORAL_TABLET | Freq: Two times a day (BID) | ORAL | 0 refills | Status: AC
  Filled 2024-01-02: qty 70, 35d supply, fill #0

## 2024-01-02 MED ORDER — DOXYCYCLINE HYCLATE 100 MG PO TABS
100.0000 mg | ORAL_TABLET | Freq: Two times a day (BID) | ORAL | 0 refills | Status: AC
  Filled 2024-01-02: qty 70, 35d supply, fill #0

## 2024-01-02 MED ORDER — CLOPIDOGREL BISULFATE 75 MG PO TABS
75.0000 mg | ORAL_TABLET | Freq: Every day | ORAL | 0 refills | Status: AC
  Filled 2024-01-02: qty 90, 90d supply, fill #0

## 2024-01-02 MED ORDER — DOXYCYCLINE HYCLATE 100 MG PO TABS
100.0000 mg | ORAL_TABLET | Freq: Two times a day (BID) | ORAL | Status: DC
  Administered 2024-01-02: 12:00:00 100 mg via ORAL
  Filled 2024-01-02: qty 1

## 2024-01-02 MED ADMIN — Gabapentin Cap 300 MG: 600 mg | ORAL | @ 09:00:00 | NDC 16714066202

## 2024-01-02 MED ADMIN — Aspirin Tab Delayed Release 81 MG: 81 mg | ORAL | @ 09:00:00 | NDC 69618006610

## 2024-01-02 MED FILL — Protamine Sulfate Inj 10 MG/ML: INTRAVENOUS | Qty: 5 | Status: AC

## 2024-01-02 NOTE — TOC Initial Note (Addendum)
 Transition of Care [facility]) - Initial/Assessment Note    Patient Details  Name: Mitchell [name] MRN: [id number] Date of Birth: [birth year]  Transition of Care [facility]) CM/SW Contact:    Sudie Erminio Deems, RN Phone Number: 01/02/2024, 2:05 PM  Clinical Narrative: Patient presented as a transfer from [facility]. Patient is from home alone; however, has support of son that lives in [place]. ICM did speak with patient that his son would have to come in and education will need to take place for wound care. Patient did not have a preference regarding home health agency. Clinicals submitted in the hub and [facility] has accepted the patient for services. [facility] did state that the insurance will need to authorize services before they can enter the home. Patient, MD, and Staff Rn are aware. Son will need to do the dressing changes daily until [facility] can come in to visit. Staff Rn to call son and he will transport patient home. No further needs identified at this time.                ICM spoke with [employer] and a knee scooter has been ordered. [employer] to deliver DME to the home.  Expected Discharge Plan: Home w Home Health Services Barriers to Discharge: No Barriers Identified   Patient Goals and CMS Choice Patient states their goals for this hospitalization and ongoing recovery are:: plan to transition home with [facility].   Choice offered to / list presented to :  (Patient has no choice- Submitted via Hub and [facility] Accepted.)      Expected Discharge Plan and Services In-house Referral: NA Discharge Planning Services: CM Consult Post Acute Care Choice: Home Health Living arrangements for the past 2 months: Single Family Home Expected Discharge Date: 01/02/24                 DME Agency: NA       HH Arranged: RN HH Agency: [facility] ([facility]) Date HH Agency Contacted: 01/02/24 Time HH Agency Contacted: 1330 Representative spoke with at [facility] Agency: [facility]  Prior  Living Arrangements/Services Living arrangements for the past 2 months: Single Family Home Lives with:: Self Patient language and need for interpreter reviewed:: Yes Do you feel safe going back to the place where you live?: Yes      Need for Family Participation in Patient Care: Yes (Comment) Care giver support system in place?: Yes (comment) Current home services: DME Criminal Activity/Legal Involvement Pertinent to Current Situation/Hospitalization: No - Comment as needed  Activities of Daily Living   ADL Screening (condition at time of admission) Independently performs ADLs?: Yes (appropriate for developmental age) Is the patient deaf or have difficulty hearing?: No Does the patient have difficulty seeing, even when wearing glasses/contacts?: No Does the patient have difficulty concentrating, remembering, or making decisions?: No  Permission Sought/Granted Permission sought to share information with : Case Manager, Family Supports Permission granted to share information with : Yes, Verbal Permission Granted     Permission granted to share info w AGENCY: [facility]        Emotional Assessment Appearance:: Appears stated age Attitude/Demeanor/Rapport: Engaged Affect (typically observed): Appropriate Orientation: : Oriented to Self, Oriented to Place, Oriented to  Time, Oriented to Situation Alcohol  / Substance Use: Not Applicable Psych Involvement: No (comment)  Admission diagnosis:  Cellulitis of right leg [L03.115] Cellulitis of right lower extremity [L03.115] Patient Active Problem List   Diagnosis Date Noted   PAD (peripheral artery disease) 12/30/2023   Laryngeal  mass 03/02/2022   Foot osteomyelitis, right (HCC) 10/25/2020   GI bleed 10/25/2020   Blood loss anemia 10/25/2020   Osteomyelitis of right foot (HCC) 10/25/2020   Closed fracture of proximal end of right humerus with routine healing 02/25/2019   Pre-ulcerative calluses 04/20/2018   Myositis 07/08/2014    Diabetic foot ulcer associated with type 2 diabetes mellitus (HCC) 07/07/2014   Tobacco abuse 07/07/2014   Diabetic ulcer of right foot (HCC) 05/12/2014   Pulse weakness    Peripheral neuropathy    Cellulitis of right lower extremity 05/09/2014   Obesity 05/09/2014   Cellulitis of right lower leg 05/09/2014   Hyperglycemia 03/21/2013   Type 2 diabetes mellitus with foot ulcer (HCC) 03/21/2013   Noncompliance with medications due to cost issues 03/21/2013   PCP:  Maree Isles, MD Pharmacy:   [facility] [address], [place] - [address] [place] [address] Phone: [phone] Fax: [phone]     Social Drivers of Health (SDOH) Social History: SDOH Screenings   Food Insecurity: No Food Insecurity (12/26/2023)  Housing: Low Risk (12/26/2023)  Transportation Needs: No Transportation Needs (12/26/2023)  Utilities: Not At Risk (12/26/2023)  Social Connections: Moderately Isolated (12/26/2023)  Tobacco Use: High Risk (12/26/2023)   SDOH Interventions:     Readmission Risk Interventions     No data to display

## 2024-01-02 NOTE — Progress Notes (Signed)
 Mobility Specialist Progress Note:    01/02/24 1200  Mobility  Activity Ambulated with assistance  Level of Assistance Contact guard assist, steadying assist  Assistive Device Front wheel walker  Distance Ambulated (ft) 80 ft  Activity Response Tolerated well  Mobility Referral Yes  Mobility visit 1 Mobility  Mobility Specialist Start Time (ACUTE ONLY) 1016  Mobility Specialist Stop Time (ACUTE ONLY) 1032  Mobility Specialist Time Calculation (min) (ACUTE ONLY) 16 min   Received pt in bed having no complaints and agreeable to mobility. Pt was asymptomatic throughout ambulation and returned to room w/o fault. Left in chair w/ call bell in reach and all needs met.   Thersia Minder Mobility Specialist  Please contact vis Secure Chat or  Rehab Office [phone]

## 2024-01-02 NOTE — Discharge Summary (Signed)
 DISCHARGE SUMMARY  Mitchell [name]  MR#: [id number]  DOB:April 02, 1950  Date of Admission: 12/26/2023 Date of Discharge: 01/02/2024  Attending Physician:Coreen Shippee ONEIDA Moores, MD  Patient's ERE:Dyjy, Eligio, MD  Disposition: D/C home   Follow-up Appts:  Follow-up Information     [facility] at [facility] Dept. of [facility] [place] Follow up in 4 week(s).   Specialty: Vascular Surgery Why: Office will call to arrange your appt(s) Contact information: [address] [place]  [address] [phone]        Harden Jerona GAILS, MD Follow up.   Specialty: Orthopedic Surgery Why: The office will call you to arrange for a follow up visit in 7-10 days. If you do not hear from the office in 2-3 days call this number to arrange and appointment. Contact information: [address] [place]  [place] [address] [phone]                 Tests Needing Follow-up: -assess CBG control  -assure patient is following wound care orders  Wound Care: Vashe dressing changes daily and minimizing weightbearing  Discharge Diagnoses: Right foot diabetic ulcer with cellulitis and osteomyelitis of the cuboid Peripheral vascular disease DM2 Tobacco abuse  Initial presentation: 73 year old with a history of DM2 and right fourth ray amputation who presented to the ER with complaints of chronic ulceration of his right foot with increasing redness and pain of the foot extending all the way up to his knee for approximately 1 week.  He had utilized clindamycin  in the outpatient setting with no improvement.  He was initially admitted to [facility] and then transferred to [facility] for a Vascular Surgery evaluation.   Hospital Course:  Right foot diabetic ulcer with cellulitis and osteomyelitis of the cuboid Continue broad empiric antibiotic coverage - ID evaluated and made recommendations for transitioning to oral therapy which will continue to complete 6 weeks of tx  (EOT 02/06/24) - discussed case with Dr. Harden with plan presently to follow the patient status post revascularization - plan will be to discharge with Vashe dressing changes daily and minimizing weightbearing (HH arranged to monitor wound care and so told wound care prior to discharge)    Peripheral vascular disease Arterial duplex noted 75-99% stenosis in the right superficial femoral artery and ABI revealed moderate right lower extremity arterial disease -status post balloon angioplasty of the right AT with drug-coated balloon angioplasty of the right popliteal and drug-coated balloon angioplasty of the right SFA 12/30/2023 per vascular surgery -to follow-up with vascular surgery as an outpatient - continue aspirin  Plavix  and statin   DM2 CBG somewhat variable but overall reasonably well-controlled - resume usual home medical regimen at time of d/c - Jardiance  discontinued in setting of diabetic foot infection    Tobacco abuse Has been advised of the absolute need to discontinue smoking entirely and of its connection directly to vascular disease and impaired wound healing  Allergies as of 01/02/2024   No Active Allergies      Medication List     STOP taking these medications    clindamycin  300 MG capsule Commonly known as: CLEOCIN    Jardiance  25 MG Tabs tablet Generic drug: empagliflozin        TAKE these medications    acetaminophen  325 MG tablet Commonly known as: TYLENOL  Take 2 tablets (650 mg total) by mouth every 4 (four) hours as needed for headache or mild pain (pain score 1-3).   amoxicillin -clavulanate 875-125 MG tablet Commonly known  as: AUGMENTIN  Take 1 tablet by mouth every 12 (twelve) hours.   aspirin  EC 81 MG tablet Take 1 tablet (81 mg total) by mouth daily.   cholecalciferol 25 MCG (1000 UNIT) tablet Commonly known as: VITAMIN D3 Take 1,000 Units by mouth daily.   clopidogrel  75 MG tablet Commonly known as: PLAVIX  Take 1 tablet (75 mg total) by mouth  daily with breakfast. Start taking on: January 03, 2024   doxycycline  100 MG tablet Commonly known as: VIBRA -TABS Take 1 tablet (100 mg total) by mouth every 12 (twelve) hours.   ferrous sulfate  325 (65 FE) MG tablet Take 1 tablet (325 mg total) by mouth daily with breakfast.   Fiasp  FlexTouch 100 UNIT/ML FlexTouch Pen Generic drug: insulin  aspart INJECT 60-100 UNITS :INJECT 20 UNITS SUBCUTANEOUSLY IN THE MORNING, 10 UNITS AT NOON, AND 20 UNITS IN THE EVENING; MAX DAILY DOSE:100 UNITS   furosemide  20 MG tablet Commonly known as: LASIX  Take 1 tablet (20 mg total) by mouth daily.   gabapentin  600 MG tablet Commonly known as: NEURONTIN  Take 600 mg by mouth 3 (three) times daily.   Ozempic (2 MG/DOSE) 8 MG/3ML Sopn Generic drug: Semaglutide (2 MG/DOSE) Inject 2 mg into the skin every Friday.   rosuvastatin  20 MG tablet Commonly known as: CRESTOR  Take 1 tablet (20 mg total) by mouth daily. Start taking on: January 03, 2024 What changed:  medication strength how much to take   Tresiba FlexTouch 200 UNIT/ML FlexTouch Pen Generic drug: insulin  degludec Inject 10-20 Units into the skin See admin instructions. Take 20 units in the morning and night and 10 in the afternoon        Day of Discharge BP (!) 161/80 (BP Location: Right Arm)   Pulse 60   Temp 98.6 F (37 C) (Oral)   Resp 17   Ht 5' 11.5 (1.816 m)   Wt 117.9 kg   SpO2 96%   BMI 35.76 kg/m   Physical Exam: General: No acute respiratory distress Lungs: Clear to auscultation bilaterally without wheezes or crackles Cardiovascular: Regular rate and rhythm without murmur gallop or rub normal S1 and S2 Abdomen: Nontender, nondistended, soft, bowel sounds positive, no rebound, no ascites, no appreciable mass Extremities: No significant cyanosis, clubbing, or edema bilateral lower extremities  Basic Metabolic Panel: Recent Labs  Lab 12/26/23 1550 12/27/23 0445 12/30/23 0440 01/02/24 0440  NA 142 140 136 138   K 4.5 4.2 4.1 4.2  CL 102 103 104 107  CO2 27 30 25 24   GLUCOSE 48* 63* 124* 127*  BUN 13 12 18 17   CREATININE 0.94 0.88 0.95 0.87  CALCIUM  9.1 8.5* 8.0* 8.5*    CBC: Recent Labs  Lab 12/26/23 1550 12/27/23 0445 12/31/23 0417 01/02/24 0440  WBC 12.9* 10.9* 8.1 6.6  NEUTROABS 10.1*  --   --   --   HGB 13.9 13.0 12.2* 12.1*  HCT 45.5 41.6 38.5* 38.1*  MCV 90.3 89.5 87.1 87.2  PLT 305 275 280 249    Time spent in discharge (includes decision making & examination of pt): 35 minutes  01/02/2024, 1:44 PM   Reyes IVAR Moores, MD [facility] Office  [phone]

## 2024-01-02 NOTE — Discharge Instructions (Addendum)
 Change the dressing on your right foot daily. Use Vashe dressing as instructed at the hospital. If you have any concerns with the wound call Dr. Crist office for help.   Avoid putting pressure on the front of your foot. Use the scooter to get around without putting pressure on your wound.   Wound care instructions: Cleanse R lateral leg and plantar foot wounds with Vashe, do not rinse. Apply Xeroform gauze ([place]son [phone] to lateral leg/ankle wound. Apply silver hydrofiber (Lawson [phone]) to plantar foot wound. Cover both dressings with ABD pad and secure entire dressing with Kerlix roll gauze. May apply Ace bandage wrapped in same fashion as Kerlix for light compression.

## 2024-01-02 NOTE — Evaluation (Signed)
 Occupational Therapy Evaluation Patient Details Name: Mitchell [name] MRN: [id number] DOB: 05-May-1950 Today's Date: 01/02/2024   History of Present Illness   Pt is a 73 y.o. M presenting to [facility] on 12/26/23 with R chronic diabetic foot ulcer and recent 4th ray amputation, with pain and redness extending up to knee. Pt admitted with cellulites and osteomyelitis. Pt is s/p R AT balloon angioplasty of r popliteal arery and R SFA on 12/30/23. PMH is significant for R transmetatarsal amputation, DM, asthma, and tobacco use.     Clinical Impressions Pt resting in bed comfortably, c/o mild pain to RLE. Pt lives alone, has son who can assist occasionally if needed. PLOF mod I. Pt currently overall ind, no physical assist needed, Pt requires increased time/effort for ADLs and mobility with RW for support. Pt used knee scooter with PT earlier, states that may help him get around home without putting too much pressure on foot. Pt able to don/doff socks, ambulate to toilet and manage without physical assist. Pt stands at sink unsupported completing grooming and hand hygiene. Pt at this time has no further acute OT needs. Pt would benefit from [facility] follow up to maximize safety and convenience with ADLs at home.      If plan is discharge home, recommend the following:   Assist for transportation     Functional Status Assessment   Patient has had a recent decline in their functional status and demonstrates the ability to make significant improvements in function in a reasonable and predictable amount of time.     Equipment Recommendations   None recommended by OT     Recommendations for Other Services         Precautions/Restrictions   Precautions Precautions: Fall Recall of Precautions/Restrictions: Intact Restrictions Weight Bearing Restrictions Per Provider Order: Yes RLE Weight Bearing Per Provider Order: Weight bearing as tolerated     Mobility Bed Mobility Overal bed mobility:  Needs Assistance Bed Mobility: Supine to Sit, Sit to Supine     Supine to sit: Supervision Sit to supine: Supervision   General bed mobility comments: supervision for safety    Transfers Overall transfer level: Needs assistance Equipment used: Rolling walker (2 wheels) Transfers: Sit to/from Stand Sit to Stand: Supervision           General transfer comment: supervision for safety with RW      Balance Overall balance assessment: Needs assistance Sitting-balance support: No upper extremity supported, Feet supported Sitting balance-Leahy Scale: Good     Standing balance support: Single extremity supported, During functional activity Standing balance-Leahy Scale: Fair Standing balance comment: able to stand unsupported                           ADL either performed or assessed with clinical judgement   ADL Overall ADL's : Needs assistance/impaired                                       General ADL Comments: overall supervision for safety, able to use RW to ambulate as needed, don/doff socks, used restroom without physical assist.     Vision         Perception         Praxis         Pertinent Vitals/Pain Pain Assessment Pain Assessment: No/denies pain     Extremity/Trunk Assessment  Communication Communication Communication: No apparent difficulties   Cognition Arousal: Alert Behavior During Therapy: WFL for tasks assessed/performed Cognition: No apparent impairments                               Following commands: Intact       Cueing  General Comments   Cueing Techniques: Verbal cues;Visual cues      Exercises     Shoulder Instructions      Home Living Family/patient expects to be discharged to:: Private residence Living Arrangements: Alone Available Help at Discharge: Family;Available PRN/intermittently Type of Home: House Home Access: Ramped entrance     Home Layout: One  level     Bathroom Shower/Tub: Producer, Television/film/video: Standard Bathroom Accessibility: Yes   Home Equipment: Agricultural Consultant (2 wheels);Cane - single point;Wheelchair - manual;Grab bars - tub/shower;Tub bench          Prior Functioning/Environment Prior Level of Function : Independent/Modified Independent             Mobility Comments: mod I with SPC ADLs Comments: indep    OT Problem List: Decreased range of motion;Decreased activity tolerance;Impaired balance (sitting and/or standing);Pain   OT Treatment/Interventions:        OT Goals(Current goals can be found in the care plan section)   Acute Rehab OT Goals Patient Stated Goal: to return home OT Goal Formulation: With patient Time For Goal Achievement: 01/16/24 Potential to Achieve Goals: Good   OT Frequency:       Co-evaluation              AM-PAC OT 6 Clicks Daily Activity     Outcome Measure Help from another person eating meals?: None Help from another person taking care of personal grooming?: A Little Help from another person toileting, which includes using toliet, bedpan, or urinal?: A Little Help from another person bathing (including washing, rinsing, drying)?: A Little Help from another person to put on and taking off regular upper body clothing?: None Help from another person to put on and taking off regular lower body clothing?: A Little 6 Click Score: 20   End of Session Equipment Utilized During Treatment: Rolling walker (2 wheels) Nurse Communication: Mobility status  Activity Tolerance: Patient tolerated treatment well Patient left: in bed;with call bell/phone within reach  OT Visit Diagnosis: Unsteadiness on feet (R26.81);Other abnormalities of gait and mobility (R26.89);Pain Pain - Right/Left: Right Pain - part of body: Ankle and joints of foot                Time: 1358-1430 OT Time Calculation (min): 32 min Charges:  OT General Charges $OT Visit: 1 Visit OT  Evaluation $OT Eval Low Complexity: 1 Low OT Treatments $Self Care/Home Management : 8-22 mins  [place], OTR/L   Elouise JONELLE Bott 01/02/2024, 2:48 PM

## 2024-01-02 NOTE — Progress Notes (Signed)
 Physical Therapy Treatment Patient Details Name: Mitchell [name] MRN: [id number] DOB: August 09, 1950 Today's Date: 01/02/2024   History of Present Illness Pt is a 73 y.o. M presenting to [facility] on 12/26/23 with R chronic diabetic foot ulcer and recent 4th ray amputation, with pain and redness extending up to knee. Pt admitted with cellulites and osteomyelitis. Pt is s/p R AT balloon angioplasty of r popliteal arery and R SFA on 12/30/23. PMH is significant for R transmetatarsal amputation, DM, asthma, and tobacco use.    PT Comments  Pt demonstrating good control trialing knee scooter for mobility to assist with offloading RLE to facilitate wound healing. Pt encouraged to use knee scooter at home to continue offloading until wound has had time to heal with pt verbalizing understanding. PT will continue to treat pt while he is admitted. No follow up therapies recommended at this time.     If plan is discharge home, recommend the following: Assist for transportation;Help with stairs or ramp for entrance   Can travel by private vehicle        Equipment Recommendations  Other (comment) (knee scooter)    Recommendations for Other Services       Precautions / Restrictions Precautions Precautions: Fall Recall of Precautions/Restrictions: Intact Restrictions Weight Bearing Restrictions Per Provider Order: Yes RLE Weight Bearing Per Provider Order: Weight bearing as tolerated     Mobility  Bed Mobility Overal bed mobility: Needs Assistance Bed Mobility: Supine to Sit, Sit to Supine     Supine to sit: Supervision Sit to supine: Supervision   General bed mobility comments: increased time to complete    Transfers Overall transfer level: Needs assistance Equipment used: None Transfers: Sit to/from Stand Sit to Stand: Supervision           General transfer comment: pt able to push up from bed with BUE. increased time to complete.    Ambulation/Gait Ambulation/Gait assistance:  Supervision Gait Distance (Feet): 350 Feet Assistive device: Knee scooter   Gait velocity: reduced Gait velocity interpretation: <1.8 ft/sec, indicate of risk for recurrent falls   General Gait Details: Pt demonstrating good understanding of mechanics of knee scooter, utilizing breaks throughout, and navigating turns with reduced speed and no LOB noted.   Stairs             Wheelchair Mobility     Tilt Bed    Modified Rankin (Stroke Patients Only)       Balance Overall balance assessment: Needs assistance Sitting-balance support: No upper extremity supported, Feet supported Sitting balance-Leahy Scale: Good Sitting balance - Comments: seated EOB   Standing balance support: No upper extremity supported, During functional activity Standing balance-Leahy Scale: Fair Standing balance comment: able to stand unsupported                            Communication Communication Communication: No apparent difficulties  Cognition Arousal: Alert Behavior During Therapy: WFL for tasks assessed/performed   PT - Cognitive impairments: No apparent impairments                         Following commands: Intact      Cueing Cueing Techniques: Verbal cues, Visual cues  Exercises      General Comments General comments (skin integrity, edema, etc.): VSS      Pertinent Vitals/Pain Pain Assessment Pain Assessment: No/denies pain    Home Living Family/patient expects to be discharged to:: Private residence Living  Arrangements: Alone Available Help at Discharge: Family;Available PRN/intermittently Type of Home: House Home Access: Ramped entrance       Home Layout: One level Home Equipment: Agricultural Consultant (2 wheels);Cane - single point;Wheelchair - manual;Grab bars - tub/shower;Tub bench      Prior Function            PT Goals (current goals can now be found in the care plan section) Acute Rehab PT Goals Patient Stated Goal: to go home PT  Goal Formulation: With patient Time For Goal Achievement: 01/15/24 Potential to Achieve Goals: Good Progress towards PT goals: Progressing toward goals    Frequency    Min 1X/week      PT Plan      Co-evaluation              AM-PAC PT 6 Clicks Mobility   Outcome Measure  Help needed turning from your back to your side while in a flat bed without using bedrails?: A Little Help needed moving from lying on your back to sitting on the side of a flat bed without using bedrails?: A Little Help needed moving to and from a bed to a chair (including a wheelchair)?: A Little Help needed standing up from a chair using your arms (e.g., wheelchair or bedside chair)?: A Little Help needed to walk in hospital room?: A Little Help needed climbing 3-5 steps with a railing? : Total 6 Click Score: 16    End of Session Equipment Utilized During Treatment: Gait belt Activity Tolerance: Patient tolerated treatment well Patient left: in bed;with call bell/phone within reach;with bed alarm set Nurse Communication: Mobility status PT Visit Diagnosis: Other abnormalities of gait and mobility (R26.89);Muscle weakness (generalized) (M62.81)     Time: 8692-8674 PT Time Calculation (min) (ACUTE ONLY): 18 min  Charges:    $Gait Training: 8-22 mins PT General Charges $$ ACUTE PT VISIT: 1 Visit                     Leontine Hilt DPT Acute Rehab Services [phone] Prefer contact via chat    Leontine NOVAK Norabelle Kondo 01/02/2024, 3:05 PM

## 2024-01-07 ENCOUNTER — Ambulatory Visit (HOSPITAL_COMMUNITY)

## 2024-01-13 ENCOUNTER — Other Ambulatory Visit: Payer: Self-pay

## 2024-01-13 DIAGNOSIS — I739 Peripheral vascular disease, unspecified: Secondary | ICD-10-CM

## 2024-01-14 ENCOUNTER — Ambulatory Visit (HOSPITAL_COMMUNITY): Admitting: Physical Therapy

## 2024-01-17 ENCOUNTER — Ambulatory Visit (HOSPITAL_COMMUNITY): Admitting: Physical Therapy

## 2024-01-28 ENCOUNTER — Encounter: Payer: Self-pay | Admitting: Internal Medicine

## 2024-01-28 ENCOUNTER — Other Ambulatory Visit: Payer: Self-pay

## 2024-01-28 ENCOUNTER — Ambulatory Visit: Admitting: Internal Medicine

## 2024-01-28 VITALS — BP 135/72 | HR 73 | Temp 98.2°F

## 2024-01-28 DIAGNOSIS — E1169 Type 2 diabetes mellitus with other specified complication: Secondary | ICD-10-CM | POA: Diagnosis not present

## 2024-01-28 DIAGNOSIS — M868X7 Other osteomyelitis, ankle and foot: Secondary | ICD-10-CM

## 2024-01-28 DIAGNOSIS — E11621 Type 2 diabetes mellitus with foot ulcer: Secondary | ICD-10-CM | POA: Diagnosis not present

## 2024-01-28 DIAGNOSIS — I739 Peripheral vascular disease, unspecified: Secondary | ICD-10-CM | POA: Diagnosis not present

## 2024-01-28 NOTE — Progress Notes (Signed)
 "     Patient: Mitchell [name]  DOB: 10-Sep-1950 MRN: [id number] PCP: Maree Isles, MD    Chief Complaint  Patient presents with   Follow-up     Patient Active Problem List   Diagnosis Date Noted   PAD (peripheral artery disease) 12/30/2023   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/02/2022   Foot osteomyelitis, right (HCC) 10/25/2020   GI bleed 10/25/2020   Blood loss anemia 10/25/2020   Osteomyelitis of right foot (HCC) 10/25/2020   Closed fracture of proximal end of right humerus with routine healing 02/25/2019   Pre-ulcerative calluses 04/20/2018   Myositis 07/08/2014   Diabetic foot ulcer associated with type 2 diabetes mellitus (HCC) 07/07/2014   Tobacco abuse 07/07/2014   Diabetic ulcer of right foot (HCC) 05/12/2014   Pulse weakness    Peripheral neuropathy    Cellulitis of right lower extremity 05/09/2014   Obesity 05/09/2014   Cellulitis of right lower leg 05/09/2014   Hyperglycemia 03/21/2013   Type 2 diabetes mellitus with foot ulcer (HCC) 03/21/2013   Noncompliance with medications due to cost issues 03/21/2013     Subjective:  Mitchell [name] is a 74 y.o. M with past medical history as below including diabetes mellitus, asthma, maggot infestation a right foot ulcer, obesity, tobacco abuse right ray amputation of fourth digit presents for hospital follow-up for right foot osteomyelitis.  Patient had presented with worsening right foot pain he was on clindamycin  outpatient.  On arrival he had a WBC 12K.  X-ray showed soft tissue swelling at the stump.  MRI right foot without contrast showed early osteomyelitis of distal cuboid and adjacent posterior.  Circumferential subcutaneous edema of ankle extending to the foot.  Ortho was engaged and recommended transtibial amputation.  Vascular surgery was engaged as well to optimize vascular perspective.  Patient underwent right lower artery angiogram with balloon angioplasty.  Plan was ultimately to forego amputation and follow-up with orthopedics outpatient.   Patient was on cefepime  and vancomycin  and patient transition to Doxy Augmentin  complete 6 weeks antibiotics Today 02/06/2024: doing well.  Tolerating abx  Review of Systems  All other systems reviewed and are negative.   Past Medical History:  Diagnosis Date   Arthritis    Asthma    Cellulitis of right lower extremity 05/09/2014   Diabetes mellitus without complication (HCC)    Diabetic foot ulcer (HCC) 07/07/2014   Status post bedside debridement of multiple right plantar ulcers by podiatrist, Dr. Macie.   Hypercholesterolemia    IDA (iron deficiency anemia)    Maggot infestation    right foot ulcer   Obesity 05/09/2014   Pneumonia    Tobacco abuse 07/07/2014    Outpatient Medications Prior to Visit  Medication Sig Dispense Refill   acetaminophen  (TYLENOL ) 325 MG tablet Take 2 tablets (650 mg total) by mouth every 4 (four) hours as needed for headache or mild pain (pain score 1-3).     amoxicillin -clavulanate (AUGMENTIN ) 875-125 MG tablet Take 1 tablet by mouth every 12 (twelve) hours. 70 tablet 0   aspirin  EC 81 MG tablet Take 1 tablet (81 mg total) by mouth daily. 360 tablet 0   cholecalciferol (VITAMIN D3) 25 MCG (1000 UNIT) tablet Take 1,000 Units by mouth daily.     clopidogrel  (PLAVIX ) 75 MG tablet Take 1 tablet (75 mg total) by mouth daily with breakfast. 90 tablet 0   doxycycline  (VIBRA -TABS) 100 MG tablet Take 1 tablet (100 mg total) by mouth every 12 (twelve) hours. 70 tablet 0   ferrous sulfate   325 (65 FE) MG tablet Take 1 tablet (325 mg total) by mouth daily with breakfast. 30 tablet 1   furosemide  (LASIX ) 20 MG tablet Take 1 tablet (20 mg total) by mouth daily. 30 tablet 0   gabapentin  (NEURONTIN ) 600 MG tablet Take 600 mg by mouth 3 (three) times daily.     OZEMPIC, 2 MG/DOSE, 8 MG/3ML SOPN Inject 2 mg into the skin every Friday.     rosuvastatin  (CRESTOR ) 20 MG tablet Take 1 tablet (20 mg total) by mouth daily. 90 tablet 0   TRESIBA FLEXTOUCH 200 UNIT/ML SOPN Inject  10-20 Units into the skin See admin instructions. Take 20 units in the morning and night and 10 in the afternoon     FIASP  FLEXTOUCH 100 UNIT/ML FlexTouch Pen INJECT 60-100 UNITS :INJECT 20 UNITS SUBCUTANEOUSLY IN THE MORNING, 10 UNITS AT NOON, AND 20 UNITS IN THE EVENING; MAX DAILY DOSE:100 UNITS     No facility-administered medications prior to visit.     Allergies[1]  Social History[2]  No family history on file.  Objective:  There were no vitals filed for this visit. There is no height or weight on file to calculate BMI.  Physical Exam Constitutional:      General: He is not in acute distress.    Appearance: He is normal weight. He is not toxic-appearing.  HENT:     Head: Normocephalic and atraumatic.     Right Ear: External ear normal.     Left Ear: External ear normal.     Nose: No congestion or rhinorrhea.     Mouth/Throat:     Mouth: Mucous membranes are moist.     Pharynx: Oropharynx is clear.  Eyes:     Extraocular Movements: Extraocular movements intact.     Conjunctiva/sclera: Conjunctivae normal.     Pupils: Pupils are equal, round, and reactive to light.  Cardiovascular:     Rate and Rhythm: Normal rate and regular rhythm.     Heart sounds: No murmur heard.    No friction rub. No gallop.  Pulmonary:     Effort: Pulmonary effort is normal.     Breath sounds: Normal breath sounds.  Abdominal:     General: Abdomen is flat. Bowel sounds are normal.     Palpations: Abdomen is soft.  Musculoskeletal:     Cervical back: Normal range of motion and neck supple.  Skin:    General: Skin is warm and dry.  Neurological:     General: No focal deficit present.     Mental Status: He is oriented to person, place, and time.  Psychiatric:        Mood and Affect: Mood normal.        Lab Results: Lab Results  Component Value Date   WBC 6.6 01/02/2024   HGB 12.1 (L) 01/02/2024   HCT 38.1 (L) 01/02/2024   MCV 87.2 01/02/2024   PLT 249 01/02/2024    Lab Results   Component Value Date   CREATININE 0.87 01/02/2024   BUN 17 01/02/2024   NA 138 01/02/2024   K 4.2 01/02/2024   CL 107 01/02/2024   CO2 24 01/02/2024    Lab Results  Component Value Date   ALT 16 12/26/2023   AST 21 12/26/2023   ALKPHOS 109 12/26/2023   BILITOT 0.4 12/26/2023     Assessment & Plan:  #Right foot osteomyelitis #Peripheral vascular disease status post angiogram and stenting #Diabetes mellitus - Needs optimal glycemic control for wound healing #  Medication management - Patient on Doxy and Augmentin  x 6 weeks EOT 1/26 2/26. Has wound care nurse visit home two times a week - Labs today - Follow-up with ID in about a month to assess how he does off of antibiotics - Follow-up with orthopedics for plan. -Ot sees vascular appt on 02/07/23  Loney Stank, MD [facility]   01/28/2024  11:08 AM I personally spent a total of 45 minutes in the care of the patient today including preparing to see the patient, getting/reviewing separately obtained history, performing a medically appropriate exam/evaluation, counseling and educating, placing orders, documenting clinical information in the EHR, independently interpreting results, and communicating results.     [1] No Known Allergies [2]  Social History Tobacco Use   Smoking status: Every Day    Current packs/day: 0.75    Types: Cigarettes   Smokeless tobacco: Never  Vaping Use   Vaping status: Never Used  Substance Use Topics   Alcohol  use: Yes    Alcohol /week: 0.0 standard drinks of alcohol     Comment: occ-twice a week beer approx 2   Drug use: No   "

## 2024-01-28 NOTE — Patient Instructions (Addendum)
 Continue antibiotics till 02/06/24 Referred to Orthopedic F/U in one month

## 2024-01-29 LAB — COMPLETE METABOLIC PANEL WITHOUT GFR
AG Ratio: 0.9 (calc) — ABNORMAL LOW (ref 1.0–2.5)
ALT: 9 U/L (ref 9–46)
AST: 14 U/L (ref 10–35)
Albumin: 3.3 g/dL — ABNORMAL LOW (ref 3.6–5.1)
Alkaline phosphatase (APISO): 90 U/L (ref 35–144)
BUN: 17 mg/dL (ref 7–25)
CO2: 26 mmol/L (ref 20–32)
Calcium: 8.3 mg/dL — ABNORMAL LOW (ref 8.6–10.3)
Chloride: 106 mmol/L (ref 98–110)
Creat: 0.8 mg/dL (ref 0.70–1.28)
Globulin: 3.7 g/dL (ref 1.9–3.7)
Glucose, Bld: 61 mg/dL — ABNORMAL LOW (ref 65–99)
Potassium: 4.4 mmol/L (ref 3.5–5.3)
Sodium: 141 mmol/L (ref 135–146)
Total Bilirubin: 0.5 mg/dL (ref 0.2–1.2)
Total Protein: 7 g/dL (ref 6.1–8.1)

## 2024-01-29 LAB — CBC WITH DIFFERENTIAL/PLATELET
Absolute Lymphocytes: 1142 {cells}/uL (ref 850–3900)
Absolute Monocytes: 874 {cells}/uL (ref 200–950)
Basophils Absolute: 58 {cells}/uL (ref 0–200)
Basophils Relative: 0.6 %
Eosinophils Absolute: 173 {cells}/uL (ref 15–500)
Eosinophils Relative: 1.8 %
HCT: 28 % — ABNORMAL LOW (ref 39.4–51.1)
Hemoglobin: 8.7 g/dL — ABNORMAL LOW (ref 13.2–17.1)
MCH: 26.4 pg — ABNORMAL LOW (ref 27.0–33.0)
MCHC: 31.1 g/dL — ABNORMAL LOW (ref 31.6–35.4)
MCV: 85.1 fL (ref 81.4–101.7)
MPV: 11.7 fL (ref 7.5–12.5)
Monocytes Relative: 9.1 %
Neutro Abs: 7354 {cells}/uL (ref 1500–7800)
Neutrophils Relative %: 76.6 %
Platelets: 245 Thousand/uL (ref 140–400)
RBC: 3.29 Million/uL — ABNORMAL LOW (ref 4.20–5.80)
RDW: 14.7 % (ref 11.0–15.0)
Total Lymphocyte: 11.9 %
WBC: 9.6 Thousand/uL (ref 3.8–10.8)

## 2024-01-29 LAB — SED RATE MANUAL WEST RFLX: SED RATE BY MODIFIED WESTERGREN,MANUAL: 56 mm/h — ABNORMAL HIGH (ref 0–20)

## 2024-01-29 LAB — SEDIMENTATION RATE

## 2024-01-29 LAB — C-REACTIVE PROTEIN: CRP: 52 mg/L — ABNORMAL HIGH

## 2024-02-05 ENCOUNTER — Ambulatory Visit: Payer: Self-pay | Admitting: Internal Medicine

## 2024-02-06 ENCOUNTER — Telehealth: Payer: Self-pay

## 2024-02-06 NOTE — Telephone Encounter (Signed)
 We will be seeing patient for the first time tomorrow 02/07/24, will hold message until after appointment to know for sure what wound care supplies he will need.

## 2024-02-06 NOTE — Telephone Encounter (Signed)
 Katheryn from [facility] called Triage phone left VM stating  patient has no wound care supplies. Would like to know what wound care supplies patient needs? Also asking about a knee scooter. Would like a call back.   CB [phone]

## 2024-02-07 ENCOUNTER — Ambulatory Visit (HOSPITAL_BASED_OUTPATIENT_CLINIC_OR_DEPARTMENT_OTHER): Admit: 2024-02-07 | Discharge: 2024-02-07 | Disposition: A | Attending: Vascular Surgery

## 2024-02-07 ENCOUNTER — Ambulatory Visit: Admitting: Family

## 2024-02-07 ENCOUNTER — Ambulatory Visit: Admitting: Physician Assistant

## 2024-02-07 ENCOUNTER — Ambulatory Visit (HOSPITAL_COMMUNITY): Admission: RE | Admit: 2024-02-07 | Discharge: 2024-02-07 | Disposition: A | Attending: Vascular Surgery

## 2024-02-07 VITALS — BP 159/63 | HR 71 | Temp 98.3°F | Ht 71.5 in | Wt 279.0 lb

## 2024-02-07 DIAGNOSIS — I739 Peripheral vascular disease, unspecified: Secondary | ICD-10-CM

## 2024-02-07 DIAGNOSIS — I70221 Atherosclerosis of native arteries of extremities with rest pain, right leg: Secondary | ICD-10-CM

## 2024-02-07 DIAGNOSIS — M86071 Acute hematogenous osteomyelitis, right ankle and foot: Secondary | ICD-10-CM | POA: Diagnosis not present

## 2024-02-07 DIAGNOSIS — G629 Polyneuropathy, unspecified: Secondary | ICD-10-CM | POA: Diagnosis not present

## 2024-02-07 DIAGNOSIS — I890 Lymphedema, not elsewhere classified: Secondary | ICD-10-CM | POA: Diagnosis not present

## 2024-02-07 DIAGNOSIS — I872 Venous insufficiency (chronic) (peripheral): Secondary | ICD-10-CM

## 2024-02-07 LAB — VAS US ABI WITH/WO TBI
Left ABI: 0.72
Right ABI: 0.87

## 2024-02-07 NOTE — Telephone Encounter (Signed)
 Called [facility]. Receptionist tried to get connected to Mitchell [name], was on hold for several minutes to find out she was on another call. He left a message for her to return my call.

## 2024-02-07 NOTE — Progress Notes (Signed)
 "   Office Note   History of Present Illness   Mitchell [name] is a 74 y.o. (10/29/1950) male who presents for angio follow-up.  The patient recently underwent right lower extremity angiogram with balloon angioplasty of the ATA, popliteal artery, and SFA on 12/30/2023 by Dr. Pearline.  This was done for critical limb ischemia with several ulcerations to a prior TMA.  He returns today for follow-up.  He says he is doing okay.  He just recently saw Dr. Harden who is now following the wounds on his right foot.  The patient overall does not know if his wounds are making much progress because he cannot see them.  The patient's son says that several of the patient's wounds are healing.  He denies any rest pain.  He currently has an Radio broadcast assistant on his leg to help with swelling as well.  Current Outpatient Medications  Medication Sig Dispense Refill   acetaminophen  (TYLENOL ) 325 MG tablet Take 2 tablets (650 mg total) by mouth every 4 (four) hours as needed for headache or mild pain (pain score 1-3).     aspirin  EC 81 MG tablet Take 1 tablet (81 mg total) by mouth daily. 360 tablet 0   cholecalciferol (VITAMIN D3) 25 MCG (1000 UNIT) tablet Take 1,000 Units by mouth daily.     clopidogrel  (PLAVIX ) 75 MG tablet Take 1 tablet (75 mg total) by mouth daily with breakfast. 90 tablet 0   ferrous sulfate  325 (65 FE) MG tablet Take 1 tablet (325 mg total) by mouth daily with breakfast. 30 tablet 1   FIASP  FLEXTOUCH 100 UNIT/ML FlexTouch Pen INJECT 60-100 UNITS :INJECT 20 UNITS SUBCUTANEOUSLY IN THE MORNING, 10 UNITS AT NOON, AND 20 UNITS IN THE EVENING; MAX DAILY DOSE:100 UNITS     furosemide  (LASIX ) 20 MG tablet Take 1 tablet (20 mg total) by mouth daily. 30 tablet 0   gabapentin  (NEURONTIN ) 600 MG tablet Take 600 mg by mouth 3 (three) times daily.     OZEMPIC, 2 MG/DOSE, 8 MG/3ML SOPN Inject 2 mg into the skin every Friday.     rosuvastatin  (CRESTOR ) 20 MG tablet Take 1 tablet (20 mg total) by mouth daily. 90 tablet 0    TRESIBA FLEXTOUCH 200 UNIT/ML SOPN Inject 10-20 Units into the skin See admin instructions. Take 20 units in the morning and night and 10 in the afternoon     No current facility-administered medications for this visit.    REVIEW OF SYSTEMS (negative unless checked):   Cardiac:  []  Chest pain or chest pressure? []  Shortness of breath upon activity? []  Shortness of breath when lying flat? []  Irregular heart rhythm?  Vascular:  []  Pain in calf, thigh, or hip brought on by walking? []  Pain in feet at night that wakes you up from your sleep? []  Blood clot in your veins? []  Leg swelling?  Pulmonary:  []  Oxygen at home? []  Productive cough? []  Wheezing?  Neurologic:  []  Sudden weakness in arms or legs? []  Sudden numbness in arms or legs? []  Sudden onset of difficult speaking or slurred speech? []  Temporary loss of vision in one eye? []  Problems with dizziness?  Gastrointestinal:  []  Blood in stool? []  Vomited blood?  Genitourinary:  []  Burning when urinating? []  Blood in urine?  Psychiatric:  []  Major depression  Hematologic:  []  Bleeding problems? []  Problems with blood clotting?  Dermatologic:  []  Rashes or ulcers?  Constitutional:  []  Fever or chills?  Ear/Nose/Throat:  []  Change in hearing? []   Nose bleeds? []  Sore throat?  Musculoskeletal:  []  Back pain? []  Joint pain? []  Muscle pain?   Physical Examination   Vitals:   02/07/24 1427  BP: (!) 159/63  Pulse: 71  Temp: 98.3 F (36.8 C)  SpO2: 93%  Weight: 279 lb (126.6 kg)  Height: 5' 11.5 (1.816 m)   Body mass index is 38.37 kg/m.  General:  WDWN in NAD; vital signs documented above Gait: Not observed HENT: WNL, normocephalic Pulmonary: normal non-labored breathing , Cardiac: regular Abdomen: soft, NT, no masses Skin: without rashes Vascular Exam/Pulses: Brisk right AT and PT Doppler signals Extremities: Right TMA and lower leg with Unna boot, unable to assess  wounds. Musculoskeletal: no muscle wasting or atrophy  Neurologic: A&O X 3;  No focal weakness or paresthesias are detected Psychiatric:  The pt has normal affect  Non-Invasive Vascular imaging   ABI (02/07/2024) R:  ABI: 0.87 (0.77),  PT: mono DP: mono TBI:  NA L:  ABI: 0.72 (1.03),  PT: tri DP: mono TBI: 0   RLE Arterial Duplex (02/07/2024) +----------+--------+-----+---------------+----------+--------+  RIGHT    PSV cm/sRatioStenosis       Waveform  Comments  +----------+--------+-----+---------------+----------+--------+  CFA Distal177                         monophasic          +----------+--------+-----+---------------+----------+--------+  DFA      185                         triphasic           +----------+--------+-----+---------------+----------+--------+  SFA Prox  338          50-74% stenosismonophasic          +----------+--------+-----+---------------+----------+--------+  SFA Mid   222          50-74% stenosismonophasic          +----------+--------+-----+---------------+----------+--------+  SFA Distal251          50-74% stenosismonophasic          +----------+--------+-----+---------------+----------+--------+  POP Prox  271          50-74% stenosismonophasic          +----------+--------+-----+---------------+----------+--------+  POP Mid   258          50-74% stenosismonophasic          +----------+--------+-----+---------------+----------+--------+  POP Distal157          30-49% stenosismonophasic          +----------+--------+-----+---------------+----------+--------+  TP Trunk  170                         monophasic          +----------+--------+-----+---------------+----------+--------+  ATA Prox  223          50-74% stenosismonophasic           Medical Decision Making   Mitchell [name] is a 74 y.o. male who presents for surveillance of PAD  Based on the patient's vascular  studies, his ABIs on the right have improved from 0.77 to 0.87 Arterial duplex demonstrates diffuse, mild arterial disease throughout the SFA and popliteal arteries.  There does not appear to be any flow-limiting stenosis. The patient is getting regular wound care with home health.  He states he just recently saw Dr. Harden as well.  Several of his wounds on his right TMA are nearly healed.  He still  has a large wound on the plantar surface of his TMA, however this is superficial.  He is trying to avoid more proximal amputation.  On exam he has a brisk right AT and PT doppler signal He will continue wound care and follow-up with our office in 3 months with bilateral lower extremity arterial duplex and ABIs   Ahmed Holster PA-C [facility] Office: [phone]  Clinic MD: Pearline  "

## 2024-02-07 NOTE — Telephone Encounter (Signed)
 Faxed a written order to [facility] since I have not heard back from them yet. BLE unna boot with profore wrap to apply once a week

## 2024-02-12 ENCOUNTER — Encounter: Payer: Self-pay | Admitting: Family

## 2024-02-12 NOTE — Progress Notes (Signed)
 "  Post-Op Visit Note   Patient: Mitchell [name]           Date of Birth: 11/19/1950           MRN: [id number] Visit Date: 02/07/2024 PCP: Maree Isles, MD  Chief Complaint:  Chief Complaint  Patient presents with   Right Foot - Wound Check    HPI:  HPI The patient is a 74 year old gentleman seen in hospitalization follow up.  The patient has chronic ulcer to the right foot and ankle he has a history of diabetes with peripheral vascular disease.  His status post endovascular intervention.  He has also had amputation, partial foot on the right at the level of the cuneiform and cuboid  MRI on hospitalization was revealing for osteomyelitis in the cuboid.  Seen by Dr. Harden in consultation who recommended below-knee amputation.  At that point patient was not interested in pursuing surgical intervention  He is following with infectious disease, was on cefepime  and vancomycin  and has been transition to Doxy and Augmentin  for total of 6 weeks Ortho Exam On examination right lower extremity there is significant pitting edema papillomas are present there is mild erythema there is no ascending cellulitis the ulcer to the plantar aspect of the midfoot is 5 cm in diameter there is pink and gray tissue in the wound bed this does not probe to bone today.    On examination left lower extremity there is 2+ pitting edema with mild erythema and papillomas there is serous weeping present scattered open areas anteriorly  Visit Diagnoses: No diagnosis found.  Plan: Has been optimized from a vascular standpoint.  Continues on Doxy and Augmentin .  End of therapy is 2/26.  Plan to follow-up with ID in mid February.    Home health assisting with his care  Today we will apply Unna and Dynaflex to bilateral lower extremities.  Have engaged home health to assist with compression wrap and wound care.  Follow-Up Instructions: No follow-ups on file.   Imaging: No results found.  Orders:  No orders of the defined  types were placed in this encounter.  No orders of the defined types were placed in this encounter.    PMFS History: Patient Active Problem List   Diagnosis Date Noted   PAD (peripheral artery disease) 12/30/2023   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/02/2022   Foot osteomyelitis, right (HCC) 10/25/2020   GI bleed 10/25/2020   Blood loss anemia 10/25/2020   Osteomyelitis of right foot (HCC) 10/25/2020   Closed fracture of proximal end of right humerus with routine healing 02/25/2019   Pre-ulcerative calluses 04/20/2018   Myositis 07/08/2014   Diabetic foot ulcer associated with type 2 diabetes mellitus (HCC) 07/07/2014   Tobacco abuse 07/07/2014   Diabetic ulcer of right foot (HCC) 05/12/2014   Pulse weakness    Peripheral neuropathy    Cellulitis of right lower extremity 05/09/2014   Obesity 05/09/2014   Cellulitis of right lower leg 05/09/2014   Hyperglycemia 03/21/2013   Type 2 diabetes mellitus with foot ulcer (HCC) 03/21/2013   Noncompliance with medications due to cost issues 03/21/2013   Past Medical History:  Diagnosis Date   Arthritis    Asthma    Cellulitis of right lower extremity 05/09/2014   Diabetes mellitus without complication (HCC)    Diabetic foot ulcer (HCC) 07/07/2014   Status post bedside debridement of multiple right plantar ulcers by podiatrist, Dr. Macie.   Hypercholesterolemia    IDA (iron deficiency anemia)  Maggot infestation    right foot ulcer   Obesity 05/09/2014   Pneumonia    Tobacco abuse 07/07/2014    No family history on file.  Past Surgical History:  Procedure Laterality Date   APPENDECTOMY     APPLICATION OF WOUND VAC Right 10/27/2020   Procedure: APPLICATION OF WOUND VAC;  Surgeon: Serene Gaile ORN, MD;  Location: [facility] OR;  Service: Vascular;  Laterality: Right;   ESOPHAGOGASTRODUODENOSCOPY (EGD) WITH PROPOFOL  N/A 10/28/2020   Procedure: ESOPHAGOGASTRODUODENOSCOPY (EGD) WITH PROPOFOL ;  Surgeon: Federico Rosario BROCKS, MD;  Location: [facility] ENDOSCOPY;  Service:  Gastroenterology;  Laterality: N/A;   HEMOSTASIS CLIP PLACEMENT  10/28/2020   Procedure: HEMOSTASIS CLIP PLACEMENT;  Surgeon: Federico Rosario BROCKS, MD;  Location: [facility] ENDOSCOPY;  Service: Gastroenterology;;   HOT HEMOSTASIS N/A 10/28/2020   Procedure: HOT HEMOSTASIS (ARGON PLASMA COAGULATION/BICAP);  Surgeon: Federico Rosario BROCKS, MD;  Location: [facility] ENDOSCOPY;  Service: Gastroenterology;  Laterality: N/A;   LOWER EXTREMITY ANGIOGRAPHY N/A 12/30/2023   Procedure: Lower Extremity Angiography;  Surgeon: Pearline Norman RAMAN, MD;  Location: [facility] INVASIVE CV LAB;  Service: Cardiovascular;  Laterality: N/A;   LOWER EXTREMITY INTERVENTION Right 12/30/2023   Procedure: LOWER EXTREMITY INTERVENTION;  Surgeon: Pearline Norman RAMAN, MD;  Location: [facility] INVASIVE CV LAB;  Service: Cardiovascular;  Laterality: Right;   MICROLARYNGOSCOPY Bilateral 03/02/2022   Procedure: MICROLARYNGOSCOPY AND BIOPSY;  Surgeon: Llewellyn Gerard LABOR, DO;  Location: [facility] OR;  Service: ENT;  Laterality: Bilateral;   PERIPHERAL VASCULAR CATHETERIZATION N/A 07/22/2015   Procedure: Abdominal Aortogram w/Lower Extremity;  Surgeon: Carlin FORBES Haddock, MD;  Location: [facility] INVASIVE CV LAB;  Service: Cardiovascular;  Laterality: N/A;   TRANSMETATARSAL AMPUTATION Right 10/27/2020   Procedure: TRANSMETATARSAL AMPUTATION;  Surgeon: Serene Gaile ORN, MD;  Location: [facility] OR;  Service: Vascular;  Laterality: Right;   Social History   Occupational History   Not on file  Tobacco Use   Smoking status: Every Day    Current packs/day: 0.75    Types: Cigarettes   Smokeless tobacco: Never  Vaping Use   Vaping status: Never Used  Substance and Sexual Activity   Alcohol  use: Not Currently    Comment: occ-twice a week beer approx 2   Drug use: No   Sexual activity: Not on file    "

## 2024-02-12 NOTE — Telephone Encounter (Signed)
 Called patient to see if he has heard from his [facility] agency this week. He did not answer, I left a message stating that if he hasn't heard from them this week we need to see him on Friday this week to remove the wraps on his legs as they cannot stay on longer than a week. I asked to please call back to let us  know regardless.

## 2024-02-14 ENCOUNTER — Telehealth: Payer: Self-pay | Admitting: Orthopedic Surgery

## 2024-02-14 ENCOUNTER — Telehealth: Payer: Self-pay | Admitting: Family

## 2024-02-14 NOTE — Telephone Encounter (Signed)
 Patient called. Would like to know if he could go to [facility] and get his leg wrapped? His cb# [phone]

## 2024-02-14 NOTE — Telephone Encounter (Signed)
 Pt called stating he has an upcoming appt and the facility was suppose to come and rewrapped his legs and have not been all week to see him. Pt states his son don't get off until 3pm which is his ride and can't make it to our office by the time we close and he is asking if he can go to [facility] for them to rewrap his legs. Please call pt about this matter at [phone].

## 2024-02-14 NOTE — Telephone Encounter (Signed)
 This is a duplicate message.

## 2024-02-14 NOTE — Telephone Encounter (Signed)
 I called [employer] to follow up on the [facility] they are an insurance agency that gives extra support. The pt is actually being treated by [facility]. I called the [place] branch [phone] to advise that the pt has had his wraps on for a week and need to know if they are going to go out and see the pt. That he is calling asking if he should go to the hospital. At first was advised that the pt would be seen tomorrow and that his care had been delayed due to the weather. Advised that tomorrow is going to be bad weather also and was placed on hold to speak with the branch manager. I do not want the pt to go to the hospital.They said tomorrow is the best they can do and that depends on the weather. I called the pt and advised of above and if he has not heard from [facility] and they have not come out to see him to call me on Monday advised that we open at 10 and that I would get him into the office and changes his dressings. Voiced understanding.

## 2024-02-18 NOTE — Telephone Encounter (Signed)
 Please see other TE

## 2024-02-19 ENCOUNTER — Telehealth: Payer: Self-pay

## 2024-02-19 ENCOUNTER — Ambulatory Visit: Admitting: Internal Medicine

## 2024-02-19 NOTE — Telephone Encounter (Signed)
 Called patient to reschedule missed appointment, no answer left vm to contact clinic.

## 2024-02-20 ENCOUNTER — Telehealth: Payer: Self-pay | Admitting: Radiology

## 2024-02-20 NOTE — Telephone Encounter (Signed)
 Stephanie with [facility] called to get wound care orders for patient.   States she did rewrap compression wraps.  States she needs to know if any medicated ointment or wash needs to be placed on wound.  Does she need to continue the una boot that was under 4 layer wrap, if so please let her know so supplies can be ordered? Also, how often does dressings need to be changed?  Call back [phone]

## 2024-02-21 ENCOUNTER — Ambulatory Visit: Admitting: Family

## 2024-02-21 NOTE — Telephone Encounter (Signed)
 [facility], gave her wound care orders for unna boot and 3 layer profore wrap once a week. Can wash with vashe wound solution. No other treatment on wound.

## 2024-02-28 ENCOUNTER — Ambulatory Visit: Admitting: Family

## 2024-05-15 ENCOUNTER — Ambulatory Visit

## 2024-05-15 ENCOUNTER — Ambulatory Visit (HOSPITAL_COMMUNITY)
